# Patient Record
Sex: Male | Born: 1960 | Race: White | Hispanic: No | Marital: Married | Smoking: Former smoker
Health system: Southern US, Community
[De-identification: ages and names within clinical notes are randomized; demographics above are authoritative.]

## PROBLEM LIST (undated history)

## (undated) DIAGNOSIS — Z8781 Personal history of (healed) traumatic fracture: Secondary | ICD-10-CM

## (undated) DIAGNOSIS — K7460 Unspecified cirrhosis of liver: Secondary | ICD-10-CM

## (undated) DIAGNOSIS — E119 Type 2 diabetes mellitus without complications: Secondary | ICD-10-CM

## (undated) DIAGNOSIS — G4730 Sleep apnea, unspecified: Secondary | ICD-10-CM

## (undated) HISTORY — DX: Personal history of (healed) traumatic fracture: Z87.81

## (undated) HISTORY — PX: HERNIA REPAIR: SHX51

## (undated) HISTORY — PX: OTHER SURGICAL HISTORY: SHX169

## (undated) HISTORY — DX: Type 2 diabetes mellitus without complications: E11.9

---

## 2015-01-27 ENCOUNTER — Encounter: Payer: Self-pay | Admitting: Surgery

## 2015-01-27 ENCOUNTER — Other Ambulatory Visit: Payer: Self-pay | Admitting: Surgery

## 2015-01-27 NOTE — Progress Notes (Signed)
Patient ID: Connor Vaughn, male   DOB: Feb 19, 1961, 54 y.o.   MRN: [id number]  Connor Vaughn 01/27/2015 9:00 AM Location: [facility] Patient #: [id number] DOB: 06-Nov-1960 Married / Language: [place] / Race: White Male  History of Present Illness [name]; 01/27/2015 9:36 AM) Patient words: hernia.  The patient is a 54 year old male who presents with an inguinal hernia. Patient sent for surgical consultation from [facility] care in [place], [place]. Dr. Nedra Hai. Concern for inguinal hernia. (Caseworker April Lassiter [phone] extension [phone])  Pleasant active male. Does a fair amount of Curator work. Had an episode 2 weeks ago after doing some heavy lifting to attach the back of a truck and especially after hauling a large tire felt some sharp RIGHT groin pains and noticed a bulge. Felt rather nauseated. Thios bulge gotten larger down to the scrotum within a week. Was sent to urgent care center. Concern for inguinal hernia. Surgical consultation recommended. Patient notes if he lies down he anal gradually fall in. Often feels nauseated. Some bloating and belching with that as well. A little more constipated. However has been able to eat. Never had any surgeries before that he is aware of. Normally has a bowel movement about every day or so. He has not smoked in decades. Normally can walk half hour without difficulty and does moderate activity at work. No history of heart or lung disease.   Past Surgical History Gilmer Mor, CMA; 01/27/2015 9:01 AM) No pertinent past surgical history  Allergies (Sonya Bynum, CMA; 01/27/2015 9:02 AM) No Known Drug Allergies09/10/2014  Medication History (Sonya Bynum, CMA; 01/27/2015 9:02 AM) No Current Medications Medications Reconciled  Review of Systems (Sonya Bynum CMA; 01/27/2015 9:01 AM) Respiratory Not Present- Bloody sputum, Chronic Cough, Difficulty Breathing, Snoring and Wheezing.   Vitals (Sonya  Bynum CMA; 01/27/2015 9:01 AM) 01/27/2015 9:01 AM Weight: 175 lb Height: 66in Body Surface Area: 1.92 m Body Mass Index: 28.25 kg/m Temp.: 38F(Temporal)  Pulse: 79 (Regular)  BP: 132/74 (Sitting, Left Arm, Standard)    Physical Exam [name]; 01/27/2015 9:30 AM) General Mental Status-Alert. General Appearance-Not in acute distress, Not Sickly. Orientation-Oriented X3. Hydration-Well hydrated. Voice-Normal.  Integumentary Global Assessment Upon inspection and palpation of skin surfaces of the - Axillae: non-tender, no inflammation or ulceration, no drainage. and Distribution of scalp and body hair is normal. General Characteristics Temperature - normal warmth is noted.  Head and Neck Head-normocephalic, atraumatic with no lesions or palpable masses. Face Global Assessment - atraumatic, no absence of expression. Neck Global Assessment - no abnormal movements, no bruit auscultated on the right, no bruit auscultated on the left, no decreased range of motion, non-tender. Trachea-midline. Thyroid Gland Characteristics - non-tender.  Eye Eyeball - Left-Extraocular movements intact, No Nystagmus. Eyeball - Right-Extraocular movements intact, No Nystagmus. Cornea - Left-No Hazy. Cornea - Right-No Hazy. Sclera/Conjunctiva - Left-No scleral icterus, No Discharge. Sclera/Conjunctiva - Right-No scleral icterus, No Discharge. Pupil - Left-Direct reaction to light normal. Pupil - Right-Direct reaction to light normal.  ENMT Ears Pinna - Left - no drainage observed, no generalized tenderness observed. Right - no drainage observed, no generalized tenderness observed. Nose and Sinuses External Inspection of the Nose - no destructive lesion observed. Inspection of the nares - Left - quiet respiration. Right - quiet respiration. Mouth and Throat Lips - Upper Lip - no fissures observed, no pallor noted. Lower Lip - no fissures observed,  no pallor noted. Nasopharynx - no discharge present. Oral Cavity/Oropharynx -  Tongue - no dryness observed. Oral Mucosa - no cyanosis observed. Hypopharynx - no evidence of airway distress observed.  Chest and Lung Exam Inspection Movements - Normal and Symmetrical. Accessory muscles - No use of accessory muscles in breathing. Palpation Palpation of the chest reveals - Non-tender. Auscultation Breath sounds - Normal and Clear.  Cardiovascular Auscultation Rhythm - Regular. Murmurs & Other Heart Sounds - Auscultation of the heart reveals - No Murmurs and No Systolic Clicks.  Abdomen Inspection Inspection of the abdomen reveals - No Visible peristalsis and No Abnormal pulsations. Umbilicus - No Bleeding, No Urine drainage. Palpation/Percussion Palpation and Percussion of the abdomen reveal - Soft, Non Tender, No Rebound tenderness, No Rigidity (guarding) and No Cutaneous hyperesthesia. Note: Mild diastases recti. Mild asymmetry at bellybutton but no definite hernia.   Male Genitourinary Sexual Maturity Tanner 5 - Adult hair pattern and Adult penile size and shape. Note: Circumcised male. Large RIGHT inguinal hernia going down the scrotum. Reducible. Rather sensitive. Mild impulse in LEFT groin suspicious for small LEFT inguinal hernia. RIGHT testicle and epididymides rather sensitive but no discrete mass. LEFT testicle optically tender.   Peripheral Vascular Upper Extremity Inspection - Left - No Cyanotic nailbeds, Not Ischemic. Right - No Cyanotic nailbeds, Not Ischemic.  Neurologic Neurologic evaluation reveals -normal attention span and ability to concentrate, able to name objects and repeat phrases. Appropriate fund of knowledge , normal sensation and normal coordination. Mental Status Affect - not angry, not paranoid. Cranial Nerves-Normal Bilaterally. Gait-Normal.  Neuropsychiatric Mental status exam performed with findings of-able to articulate well with normal  speech/language, rate, volume and coherence, thought content normal with ability to perform basic computations and apply abstract reasoning and no evidence of hallucinations, delusions, obsessions or homicidal/suicidal ideation.  Musculoskeletal Global Assessment Spine, Ribs and Pelvis - no instability, subluxation or laxity. Right Upper Extremity - no instability, subluxation or laxity.  Lymphatic Head & Neck  General Head & Neck Lymphatics: Bilateral - Description - No Localized lymphadenopathy. Axillary  General Axillary Region: Bilateral - Description - No Localized lymphadenopathy. Femoral & Inguinal  Generalized Femoral & Inguinal Lymphatics: Left - Description - No Localized lymphadenopathy. Right - Description - No Localized lymphadenopathy.    Assessment & Plan [name]; 01/27/2015 9:34 AM) BILATERAL INGUINAL HERNIA WITHOUT OBSTRUCTION OR GANGRENE, RECURRENCE NOT SPECIFIED (K40.20) Impression: Large and rather sensitive but reducible RIGHT inguinal hernia. Small and less sensitive LEFT inguinal hernia.  I think he would benefit from surgical repair. Reasonable laparoscopic approach as the hernia defect is not particularly large once reduced. He prefers a laparoscopic approach impossible.  Given the sensitivity in size, think we should try and expedite this. He agrees. He wants get back to unrestricted work when it is safe. Current Plans  You are being scheduled for surgery - Our schedulers will call you.  You should hear from our office's scheduling department within 5 working days about the location, date, and time of surgery. We try to make accommodations for patient's preferences in scheduling surgery, but sometimes the OR schedule or the surgeon's schedule prevents [place] from making those accommodations.  If you have not heard from our office [phone]) in 5 working days, call the office and ask for your surgeon's nurse.  If you have other questions about your  diagnosis, plan, or surgery, call the office and ask for your surgeon's nurse. Written instructions provided Pt Education - Pamphlet Given - Laparoscopic Hernia Repair: discussed with patient and provided information.   The anatomy &  physiology of the abdominal wall and pelvic floor was discussed. The pathophysiology of hernias in the inguinal and pelvic region was discussed. Natural history risks such as progressive enlargement, pain, incarceration, and strangulation was discussed. Contributors to complications such as smoking, obesity, diabetes, prior surgery, etc were discussed.  I feel the risks of no intervention will lead to serious problems that outweigh the operative risks; therefore, I recommended surgery to reduce and repair the hernia. I explained laparoscopic techniques with possible need for an open approach. I noted usual use of mesh to patch and/or buttress hernia repair  Risks such as bleeding, infection, abscess, need for further treatment, heart attack, death, and other risks were discussed. I noted a good likelihood this will help address the problem. Goals of post-operative recovery were discussed as well. Possibility that this will not correct all symptoms was explained. I stressed the importance of low-impact activity, aggressive pain control, avoiding constipation, & not pushing through pain to minimize risk of post-operative chronic pain or injury. Possibility of reherniation was discussed. We will work to minimize complications.  An educational handout further explaining the pathology & treatment options was given as well. Questions were answered. The patient expresses understanding & wishes to proceed with surgery. Pt Education - [facility] Pain Control (Elbert Polyakov) Pt Education - [facility] Hernia Post-Op HCI (Cherrelle Plante): discussed with patient and provided information. Started Naproxen 500MG , 1 (one) Tablet two times daily, as needed, #30, 01/27/2015, Ref. x2.   Signed by Ardeth Sportsman, MD  (01/27/2015 9:37 AM)  Ardeth Sportsman, M.D., F.A.C.S. Gastrointestinal and Minimally Invasive Surgery [facility] [address] [place], [place] [address] [phone] Main / Paging

## 2015-09-11 ENCOUNTER — Other Ambulatory Visit: Payer: Self-pay | Admitting: Surgery

## 2015-09-11 NOTE — H&P (Signed)
Connor Vaughn 01/27/2015 9:00 AM Location: [facility] Patient #: [id number] DOB: September 14, 1960 Married / Language: English / Race: White Male   History of Present Illness  Patient words: hernia.  The patient is a 55 year old male who presents with an inguinal hernia. Patient sent for surgical consultation from [facility] care in [place], [place]. Dr. Nedra Hai. Concern for inguinal hernia. 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 Felt rather nauseated. This bulge has gotten larger down to the scrotum within a week. Was sent to urgent care center. Concern for inguinal hernia. Surgical consultation recommended. Patient notes if he lies down he anal gradually fall in. Often feels nauseated. Some bloating and belching with that as well. A little more constipated. However has been able to eat. Never had any surgeries before that he is aware of. Normally has a bowel movement about every day or so. He has not smoked in decades. Normally can walk half hour without difficulty and does moderate activity at work. No history of heart or lung disease.   Past Surgical History Gilmer Mor, CMA; 01/27/2015 9:01 AM) No pertinent past surgical history  Allergies (Sonya Bynum, CMA; 01/27/2015 9:02 AM) No Known Drug Allergies09/10/2014  Medication History (Sonya Bynum, CMA; 01/27/2015 9:02 AM) No Current Medications Medications Reconciled    Review of Systems (Sonya Bynum CMA; 01/27/2015 9:01 AM) Respiratory Not Present- Bloody sputum, Chronic Cough, Difficulty Breathing, Snoring and Wheezing.  Vitals (Sonya Bynum CMA; 01/27/2015 9:01 AM) 01/27/2015 9:01 AM Weight: 175 lb Height: 66in Body Surface Area: 1.92 m  Body Mass Index: 28.25 kg/m  Temp.: 45F(Temporal)  Pulse: 79 (Regular)  BP: 132/74 (Sitting, Left Arm, Standard)     Physical Exam [name]; 01/27/2015 9:30 AM) General Mental Status-Alert. General Appearance-Not in acute distress, Not Sickly. Orientation-Oriented X3. Hydration-Well hydrated. Voice-Normal.  Integumentary Global Assessment Upon inspection and palpation of skin surfaces of the - Axillae: non-tender, no inflammation or ulceration, no drainage. and Distribution of scalp and body hair is normal. General Characteristics Temperature - normal warmth is noted.  Head and Neck Head-normocephalic, atraumatic with no lesions or palpable masses. Face Global Assessment - atraumatic, no absence of expression. Neck Global Assessment - no abnormal movements, no bruit auscultated on the right, no bruit auscultated on the left, no decreased range of motion, non-tender. Trachea-midline. Thyroid Gland Characteristics - non-tender.  Eye Eyeball - Left-Extraocular movements intact, No Nystagmus. Eyeball - Right-Extraocular movements intact, No Nystagmus. Cornea - Left-No Hazy. Cornea - Right-No Hazy. Sclera/Conjunctiva - Left-No scleral icterus, No Discharge. Sclera/Conjunctiva - Right-No scleral icterus, No Discharge. Pupil - Left-Direct reaction to light normal. Pupil - Right-Direct reaction to light normal.  ENMT Ears Pinna - Left - no drainage observed, no generalized tenderness observed. Right - no drainage observed, no generalized tenderness observed. Nose and Sinuses External Inspection of the Nose - no destructive lesion observed. Inspection of the nares - Left - quiet respiration. Right - quiet respiration. Mouth and Throat Lips - Upper Lip - no fissures observed, no pallor noted. Lower Lip - no fissures observed, no pallor noted. Nasopharynx - no discharge present. Oral Cavity/Oropharynx - Tongue - no dryness observed.  Oral Mucosa - no cyanosis observed. Hypopharynx - no evidence of  airway distress observed.  Chest and Lung Exam Inspection Movements - Normal and Symmetrical. Accessory muscles - No use of accessory muscles in breathing. Palpation Palpation of the chest reveals - Non-tender. Auscultation Breath sounds - Normal and Clear.  Cardiovascular Auscultation Rhythm - Regular. Murmurs & Other Heart Sounds - Auscultation of the heart reveals - No Murmurs and No Systolic Clicks.  Abdomen Inspection Inspection of the abdomen reveals - No Visible peristalsis and No Abnormal pulsations. Umbilicus - No Bleeding, No Urine drainage. Palpation/Percussion Palpation and Percussion of the abdomen reveal - Soft, Non Tender, No Rebound tenderness, No Rigidity (guarding) and No Cutaneous hyperesthesia. Note: Mild diastases recti. Mild asymmetry at bellybutton but no definite hernia.   Male Genitourinary Sexual Maturity Tanner 5 - Adult hair pattern and Adult penile size and shape. Note: Circumcised male. Large RIGHT inguinal hernia going down the scrotum. Reducible. Rather sensitive. Mild impulse in LEFT groin suspicious for small LEFT inguinal hernia. RIGHT testicle and epididymides rather sensitive but no discrete mass. LEFT testicle optically tender.   Peripheral Vascular Upper Extremity Inspection - Left - No Cyanotic nailbeds, Not Ischemic. Right - No Cyanotic nailbeds, Not Ischemic.  Neurologic Neurologic evaluation reveals -normal attention span and ability to concentrate, able to name objects and repeat phrases. Appropriate fund of knowledge , normal sensation and normal coordination. Mental Status Affect - not angry, not paranoid. Cranial Nerves-Normal Bilaterally. Gait-Normal.  Neuropsychiatric Mental status exam performed with findings of-able to articulate well with normal speech/language, rate, volume and coherence, thought content normal with ability to perform basic  computations and apply abstract reasoning and no evidence of hallucinations, delusions, obsessions or homicidal/suicidal ideation.  Musculoskeletal Global Assessment Spine, Ribs and Pelvis - no instability, subluxation or laxity. Right Upper Extremity - no instability, subluxation or laxity.  Lymphatic Head & Neck  General Head & Neck Lymphatics: Bilateral - Description - No Localized lymphadenopathy. Axillary  General Axillary Region: Bilateral - Description - No Localized lymphadenopathy. Femoral & Inguinal  Generalized Femoral & Inguinal Lymphatics: Left - Description - No Localized lymphadenopathy. Right - Description - No Localized lymphadenopathy.    Assessment & Plan BILATERAL INGUINAL HERNIA WITHOUT OBSTRUCTION OR GANGRENE, RECURRENCE NOT SPECIFIED (K40.20)  Impression: Large and rather sensitive but reducible RIGHT inguinal hernia. Small and less sensitive LEFT inguinal hernia.  I think he would benefit from surgical repair. Reasonable laparoscopic approach as the hernia defect is not particularly large once reduced. He prefers a laparoscopic approach impossible.  Given the sensitivity in size, think we should try and expedite this. He agrees. He wants get back to unrestricted work when it is safe.   Current Plans You are being scheduled for surgery - Our schedulers will call you.  You should hear from our office's scheduling department within 5 working days about the location, date, and time of surgery. We try to make accommodations for patient's preferences in scheduling surgery, but sometimes the OR schedule or the surgeon's schedule prevents us from making those accommodations.  If you have not heard from our office [phone]) in 5 working days, call the office and ask for your surgeon's nurse.  If you have other questions about your diagnosis, plan, or surgery, call the office and ask for your surgeon's nurse.  Written instructions provided  Pt Education -  Pamphlet Given - Laparoscopic Hernia Repair: discussed with patient and provided information.   The anatomy & physiology of the abdominal wall and pelvic floor was discussed. The pathophysiology of hernias in the inguinal and pelvic  region was discussed. Natural history risks such as progressive enlargement, pain, incarceration, and strangulation was discussed. Contributors to complications such as smoking, obesity, diabetes, prior surgery, etc were discussed.  I feel the risks of no intervention will lead to serious problems that outweigh the operative risks; therefore, I recommended surgery to reduce and repair the hernia. I explained laparoscopic techniques with possible need for an open approach. I noted usual use of mesh to patch and/or buttress hernia repair  Risks such as bleeding, infection, abscess, need for further treatment, heart attack, death, and other risks were discussed. I noted a good likelihood this will help address the problem. Goals of post-operative recovery were discussed as well. Possibility that this will not correct all symptoms was explained. I stressed the importance of low-impact activity, aggressive pain control, avoiding constipation, & not pushing through pain to minimize risk of post-operative chronic pain or injury. Possibility of reherniation was discussed. We will work to minimize complications.  An educational handout further explaining the pathology & treatment options was given as well. Questions were answered. The patient expresses understanding & wishes to proceed with surgery.  Pt Education - [facility] Pain Control (Lindsee Labarre) Pt Education - [facility] Hernia Post-Op HCI (Ariani Seier): discussed with patient and provided information. Started Naproxen , 1 (one) Tablet two times daily, as needed, #30, 01/27/2015, Ref. Beverlyn Roux, M.D., F.A.C.S. Gastrointestinal and Minimally Invasive Surgery [facility] [address], [address] [place],  [place] [address] [phone] Main / Paging

## 2015-09-14 NOTE — Patient Instructions (Signed)
Benn MoulderScott Groner  09/14/2015   Your procedure is scheduled on: 09/17/2015    [facility]  [place]  elevators to 3rd floor to  Short Stay Center at    0800 AM.  Call this number if you have problems the morning of surgery [phone]   Remember: ONLY 1 PERSON MAY GO WITH YOU TO SHORT STAY TO GET  READY MORNING OF YOUR SURGERY.  Do not eat food or drink liquids :After Midnight.     Take these medicines the morning of surgery with A SIP OF WATER: none                                 You may not have any metal on your body including hair pins and              piercings  Do not wear jewelry, , lotions, powders or perfumes, deodorant                        Men may shave face and neck.   Do not bring valuables to the hospital. [facility] IS NOT             RESPONSIBLE   FOR VALUABLES.  Contacts, dentures or bridgework may not be worn into surgery.       Patients discharged the day of surgery will not be allowed to drive home.  Name and phone number of your driver:  Special Instructions: coughing and deep breathing exercises, leg exercises               Please read over the following fact sheets you were given: _____________________________________________________________________             [facility] - Preparing for Surgery Before surgery, you can play an important role.  Because skin is not sterile, your skin needs to be as free of germs as possible.  You can reduce the number of germs on your skin by washing with CHG (chlorahexidine gluconate) soap before surgery.  CHG is an antiseptic cleaner which kills germs and bonds with the skin to continue killing germs even after washing. Please DO NOT use if you have an allergy to CHG or antibacterial soaps.  If your skin becomes reddened/irritated stop using the CHG and inform your nurse when you arrive at Short Stay. Do not shave (including legs and underarms) for at least 48 hours  prior to the first CHG shower.  You may shave your face/neck. Please follow these instructions carefully:  1.  Shower with CHG Soap the night before surgery and the  morning of Surgery.  2.  If you choose to wash your hair, wash your hair first as usual with your  normal  shampoo.  3.  After you shampoo, rinse your hair and body thoroughly to remove the  shampoo.                           4.  Use CHG as you would any other liquid soap.  You can apply chg directly  to the skin and wash                       Gently with a scrungie or clean washcloth.  5.  Apply the CHG Soap to your body ONLY FROM THE NECK DOWN.   Do not use on face/ open                           Wound or open sores. Avoid contact with eyes, ears mouth and genitals (private parts).                       Wash face,  Genitals (private parts) with your normal soap.             6.  Wash thoroughly, paying special attention to the area where your surgery  will be performed.  7.  Thoroughly rinse your body with warm water from the neck down.  8.  DO NOT shower/wash with your normal soap after using and rinsing off  the CHG Soap.                9.  Pat yourself dry with a clean towel.            10.  Wear clean pajamas.            11.  Place clean sheets on your bed the night of your first shower and do not  sleep with pets. Day of Surgery : Do not apply any lotions/deodorants the morning of surgery.  Please wear clean clothes to the hospital/surgery center.  FAILURE TO FOLLOW THESE INSTRUCTIONS MAY RESULT IN THE CANCELLATION OF YOUR SURGERY PATIENT SIGNATURE_________________________________  NURSE SIGNATURE__________________________________  ________________________________________________________________________

## 2015-09-15 ENCOUNTER — Encounter (HOSPITAL_COMMUNITY)
Admission: RE | Admit: 2015-09-15 | Discharge: 2015-09-15 | Disposition: A | Discharge status: Home / Self Care | Payer: BLUE CROSS/BLUE SHIELD | Source: Ambulatory Visit | Attending: Surgery | Admitting: Surgery

## 2015-09-15 ENCOUNTER — Encounter (HOSPITAL_COMMUNITY): Payer: Self-pay

## 2015-09-15 DIAGNOSIS — Z01818 Encounter for other preprocedural examination: Secondary | ICD-10-CM | POA: Insufficient documentation

## 2015-09-15 DIAGNOSIS — I10 Essential (primary) hypertension: Secondary | ICD-10-CM | POA: Diagnosis not present

## 2015-09-15 LAB — COMPREHENSIVE METABOLIC PANEL
ALT: 64 U/L — ABNORMAL HIGH (ref 17–63)
AST: 124 U/L — ABNORMAL HIGH (ref 15–41)
Albumin: 3.2 g/dL — ABNORMAL LOW (ref 3.5–5.0)
Alkaline Phosphatase: 150 U/L — ABNORMAL HIGH (ref 38–126)
Anion gap: 8 (ref 5–15)
BUN: 9 mg/dL (ref 6–20)
CO2: 26 mmol/L (ref 22–32)
Calcium: 8.8 mg/dL — ABNORMAL LOW (ref 8.9–10.3)
Chloride: 105 mmol/L (ref 101–111)
Creatinine, Ser: 0.83 mg/dL (ref 0.61–1.24)
GFR calc Af Amer: >60 mL/min (ref >60)
GFR calc non Af Amer: >60 mL/min (ref >60)
Glucose, Bld: 152 mg/dL — ABNORMAL HIGH (ref 65–99)
Potassium: 4.2 mmol/L (ref 3.5–5.1)
Sodium: 139 mmol/L (ref 135–145)
Total Bilirubin: 2.8 mg/dL — ABNORMAL HIGH (ref 0.3–1.2)
Total Protein: 6.6 g/dL (ref 6.5–8.1)

## 2015-09-15 LAB — CBC
HCT: 42 % (ref 39.0–52.0)
Hemoglobin: 14.8 g/dL (ref 13.0–17.0)
MCH: 32.2 pg (ref 26.0–34.0)
MCHC: 35.2 g/dL (ref 30.0–36.0)
MCV: 91.5 fL (ref 78.0–100.0)
Platelets: 71 10*3/uL — ABNORMAL LOW (ref 150–400)
RBC: 4.59 MIL/uL (ref 4.22–5.81)
RDW: 14.4 % (ref 11.5–15.5)
WBC: 3.1 10*3/uL — ABNORMAL LOW (ref 4.0–10.5)

## 2015-09-15 NOTE — Progress Notes (Signed)
Spoke with Nettie ElmSylvia ( Triage) and DR Michaell CowingGross is in office and she will send him a note to please look at labs done 09/15/2015 which I have routed to him via EPIC. ,

## 2015-09-15 NOTE — Progress Notes (Signed)
Patient scored a " 5" on the [employer] Bang Assessment Tool for Obstructive Sleep Apnea during a presurgical visit.  This score is considered a high risk for Obstructive Sleep Apnea using this tool.  FYI.

## 2015-09-15 NOTE — Progress Notes (Signed)
CBC and CMP done 09/15/2015 faxed via EPIC to Dr Michaell CowingGross.

## 2015-09-17 ENCOUNTER — Encounter (HOSPITAL_COMMUNITY): Admission: RE | Payer: Self-pay | Source: Ambulatory Visit

## 2015-09-17 ENCOUNTER — Ambulatory Visit (HOSPITAL_COMMUNITY): Admission: RE | Admit: 2015-09-17 | Payer: BLUE CROSS/BLUE SHIELD | Source: Ambulatory Visit | Admitting: Surgery

## 2015-09-17 SURGERY — REPAIR, HERNIA, INGUINAL, BILATERAL, LAPAROSCOPIC
Anesthesia: General | Laterality: Bilateral

## 2015-12-24 ENCOUNTER — Emergency Department (HOSPITAL_COMMUNITY): Payer: BLUE CROSS/BLUE SHIELD

## 2015-12-24 ENCOUNTER — Inpatient Hospital Stay (HOSPITAL_COMMUNITY)
Admission: EM | Admit: 2015-12-24 | Discharge: 2015-12-26 | DRG: 186 | Disposition: A | Discharge status: Home / Self Care | Payer: BLUE CROSS/BLUE SHIELD | Attending: Internal Medicine | Admitting: Internal Medicine

## 2015-12-24 ENCOUNTER — Inpatient Hospital Stay (HOSPITAL_COMMUNITY): Payer: BLUE CROSS/BLUE SHIELD

## 2015-12-24 ENCOUNTER — Encounter (HOSPITAL_COMMUNITY): Payer: Self-pay | Admitting: Emergency Medicine

## 2015-12-24 DIAGNOSIS — K7030 Alcoholic cirrhosis of liver without ascites: Secondary | ICD-10-CM | POA: Diagnosis not present

## 2015-12-24 DIAGNOSIS — Z9889 Other specified postprocedural states: Secondary | ICD-10-CM

## 2015-12-24 DIAGNOSIS — Z91040 Latex allergy status: Secondary | ICD-10-CM | POA: Diagnosis not present

## 2015-12-24 DIAGNOSIS — W06XXXA Fall from bed, initial encounter: Secondary | ICD-10-CM | POA: Diagnosis present

## 2015-12-24 DIAGNOSIS — F419 Anxiety disorder, unspecified: Secondary | ICD-10-CM | POA: Diagnosis present

## 2015-12-24 DIAGNOSIS — Z833 Family history of diabetes mellitus: Secondary | ICD-10-CM

## 2015-12-24 DIAGNOSIS — J948 Other specified pleural conditions: Secondary | ICD-10-CM | POA: Diagnosis present

## 2015-12-24 DIAGNOSIS — K7460 Unspecified cirrhosis of liver: Secondary | ICD-10-CM

## 2015-12-24 DIAGNOSIS — R55 Syncope and collapse: Secondary | ICD-10-CM | POA: Diagnosis present

## 2015-12-24 DIAGNOSIS — J189 Pneumonia, unspecified organism: Secondary | ICD-10-CM | POA: Diagnosis present

## 2015-12-24 DIAGNOSIS — R0602 Shortness of breath: Secondary | ICD-10-CM | POA: Diagnosis present

## 2015-12-24 DIAGNOSIS — F1010 Alcohol abuse, uncomplicated: Secondary | ICD-10-CM | POA: Diagnosis present

## 2015-12-24 DIAGNOSIS — Z8701 Personal history of pneumonia (recurrent): Secondary | ICD-10-CM

## 2015-12-24 DIAGNOSIS — I864 Gastric varices: Secondary | ICD-10-CM | POA: Diagnosis present

## 2015-12-24 DIAGNOSIS — J90 Pleural effusion, not elsewhere classified: Secondary | ICD-10-CM | POA: Diagnosis present

## 2015-12-24 DIAGNOSIS — K7031 Alcoholic cirrhosis of liver with ascites: Secondary | ICD-10-CM

## 2015-12-24 DIAGNOSIS — Z8249 Family history of ischemic heart disease and other diseases of the circulatory system: Secondary | ICD-10-CM

## 2015-12-24 DIAGNOSIS — Z87891 Personal history of nicotine dependence: Secondary | ICD-10-CM | POA: Diagnosis not present

## 2015-12-24 DIAGNOSIS — R634 Abnormal weight loss: Secondary | ICD-10-CM | POA: Diagnosis present

## 2015-12-24 HISTORY — DX: Alcohol abuse, uncomplicated: F10.10

## 2015-12-24 LAB — BASIC METABOLIC PANEL
Anion gap: 13 (ref 5–15)
BUN: 11 mg/dL (ref 6–20)
CO2: 27 mmol/L (ref 22–32)
Calcium: 8.9 mg/dL (ref 8.9–10.3)
Chloride: 94 mmol/L — ABNORMAL LOW (ref 101–111)
Creatinine, Ser: 0.87 mg/dL (ref 0.61–1.24)
GFR calc Af Amer: >60 mL/min (ref >60)
GFR calc non Af Amer: >60 mL/min (ref >60)
Glucose, Bld: 153 mg/dL — ABNORMAL HIGH (ref 65–99)
Potassium: 3.5 mmol/L (ref 3.5–5.1)
Sodium: 134 mmol/L — ABNORMAL LOW (ref 135–145)

## 2015-12-24 LAB — CBC WITH DIFFERENTIAL/PLATELET
Basophils Absolute: 0.1 10*3/uL (ref 0.0–0.1)
Basophils Relative: 1 %
Eosinophils Absolute: 0.3 10*3/uL (ref 0.0–0.7)
Eosinophils Relative: 3 %
HCT: 44.2 % (ref 39.0–52.0)
Hemoglobin: 15.2 g/dL (ref 13.0–17.0)
Lymphocytes Relative: 17 %
Lymphs Abs: 1.9 10*3/uL (ref 0.7–4.0)
MCH: 33.3 pg (ref 26.0–34.0)
MCHC: 34.4 g/dL (ref 30.0–36.0)
MCV: 96.7 fL (ref 78.0–100.0)
Monocytes Absolute: 1.2 10*3/uL — ABNORMAL HIGH (ref 0.1–1.0)
Monocytes Relative: 11 %
Neutro Abs: 7.6 10*3/uL (ref 1.7–7.7)
Neutrophils Relative %: 68 %
Platelets: 149 10*3/uL — ABNORMAL LOW (ref 150–400)
RBC: 4.57 MIL/uL (ref 4.22–5.81)
RDW: 14.6 % (ref 11.5–15.5)
WBC: 11.1 10*3/uL — ABNORMAL HIGH (ref 4.0–10.5)

## 2015-12-24 LAB — BODY FLUID CELL COUNT WITH DIFFERENTIAL
Eos, Fluid: 0 %
Lymphs, Fluid: 9 %
Monocyte-Macrophage-Serous Fluid: 57 % (ref 50–90)
Neutrophil Count, Fluid: 34 % — ABNORMAL HIGH (ref 0–25)
Total Nucleated Cell Count, Fluid: 246 cu mm (ref 0–1000)

## 2015-12-24 LAB — CBG MONITORING, ED: Glucose-Capillary: 134 mg/dL — ABNORMAL HIGH (ref 65–99)

## 2015-12-24 LAB — APTT: aPTT: 31 seconds (ref 24–36)

## 2015-12-24 LAB — PROTEIN, BODY FLUID: Total protein, fluid: <3 g/dL

## 2015-12-24 LAB — GLUCOSE, SEROUS FLUID: Glucose, Fluid: 145 mg/dL

## 2015-12-24 LAB — I-STAT TROPONIN, ED: Troponin i, poc: 0.01 ng/mL (ref 0.00–0.08)

## 2015-12-24 LAB — LACTATE DEHYDROGENASE, PLEURAL OR PERITONEAL FLUID: LD, Fluid: 39 U/L — ABNORMAL HIGH (ref 3–23)

## 2015-12-24 LAB — PROTIME-INR
INR: 1.15
Prothrombin Time: 14.8 seconds (ref 11.4–15.2)

## 2015-12-24 LAB — LACTATE DEHYDROGENASE: LDH: 200 U/L — ABNORMAL HIGH (ref 98–192)

## 2015-12-24 MED ORDER — VITAMIN B-1 100 MG PO TABS
100.0000 mg | ORAL_TABLET | Freq: Every day | ORAL | Status: DC
  Administered 2015-12-24 – 2015-12-26 (×3): 100 mg via ORAL
  Filled 2015-12-24 (×3): qty 1

## 2015-12-24 MED ORDER — ALBUTEROL SULFATE (2.5 MG/3ML) 0.083% IN NEBU
2.5000 mg | INHALATION_SOLUTION | RESPIRATORY_TRACT | Status: DC | PRN
  Administered 2015-12-25: 2.5 mg via RESPIRATORY_TRACT
  Filled 2015-12-24: qty 3

## 2015-12-24 MED ORDER — ONDANSETRON HCL 4 MG/2ML IJ SOLN: 4.0000 mg | Freq: Four times a day (QID) | INTRAMUSCULAR | Status: DC | PRN

## 2015-12-24 MED ORDER — FOLIC ACID 1 MG PO TABS
1.0000 mg | ORAL_TABLET | Freq: Every day | ORAL | Status: DC
  Administered 2015-12-24: 1 mg via ORAL
  Filled 2015-12-24: qty 1

## 2015-12-24 MED ORDER — SODIUM CHLORIDE 0.9 % IV SOLN
INTRAVENOUS | Status: DC
  Administered 2015-12-24: 125 mL/h via INTRAVENOUS

## 2015-12-24 MED ORDER — SODIUM CHLORIDE 0.9% FLUSH
3.0000 mL | Freq: Two times a day (BID) | INTRAVENOUS | Status: DC
  Administered 2015-12-24 – 2015-12-25 (×3): 3 mL via INTRAVENOUS

## 2015-12-24 MED ORDER — ONDANSETRON HCL 4 MG PO TABS: 4.0000 mg | ORAL_TABLET | Freq: Four times a day (QID) | ORAL | Status: DC | PRN

## 2015-12-24 MED ORDER — ADULT MULTIVITAMIN W/MINERALS CH: 1.0000 | ORAL_TABLET | Freq: Every day | ORAL | Status: DC

## 2015-12-24 MED ORDER — THIAMINE HCL 100 MG/ML IJ SOLN: 100.0000 mg | Freq: Every day | INTRAMUSCULAR | Status: DC

## 2015-12-24 MED ORDER — OXYCODONE HCL 5 MG PO TABS
5.0000 mg | ORAL_TABLET | ORAL | Status: DC | PRN
  Administered 2015-12-24 – 2015-12-25 (×2): 5 mg via ORAL
  Filled 2015-12-24 (×2): qty 1

## 2015-12-24 MED ORDER — SODIUM CHLORIDE 0.9% FLUSH: 3.0000 mL | INTRAVENOUS | Status: DC | PRN

## 2015-12-24 MED ORDER — SODIUM CHLORIDE 0.9% FLUSH
3.0000 mL | Freq: Two times a day (BID) | INTRAVENOUS | Status: DC
  Administered 2015-12-25 – 2015-12-26 (×2): 3 mL via INTRAVENOUS

## 2015-12-24 MED ORDER — IOPAMIDOL (ISOVUE-300) INJECTION 61%
75.0000 mL | Freq: Once | INTRAVENOUS | Status: AC | PRN
  Administered 2015-12-24: 75 mL via INTRAVENOUS

## 2015-12-24 MED ORDER — LORAZEPAM 2 MG/ML IJ SOLN: 1.0000 mg | Freq: Four times a day (QID) | INTRAMUSCULAR | Status: DC | PRN

## 2015-12-24 MED ORDER — FOLIC ACID 1 MG PO TABS
1.0000 mg | ORAL_TABLET | Freq: Every day | ORAL | Status: DC
  Administered 2015-12-25 – 2015-12-26 (×2): 1 mg via ORAL
  Filled 2015-12-24 (×2): qty 1

## 2015-12-24 MED ORDER — ADULT MULTIVITAMIN W/MINERALS CH
1.0000 | ORAL_TABLET | Freq: Every day | ORAL | Status: DC
  Administered 2015-12-24 – 2015-12-26 (×3): 1 via ORAL
  Filled 2015-12-24 (×2): qty 1

## 2015-12-24 MED ORDER — LORAZEPAM 1 MG PO TABS: 1.0000 mg | ORAL_TABLET | Freq: Four times a day (QID) | ORAL | Status: DC | PRN

## 2015-12-24 MED ORDER — SENNOSIDES-DOCUSATE SODIUM 8.6-50 MG PO TABS
1.0000 | ORAL_TABLET | Freq: Every evening | ORAL | Status: DC | PRN
  Administered 2015-12-25: 1 via ORAL
  Filled 2015-12-24: qty 1

## 2015-12-24 MED ORDER — VITAMIN B-1 100 MG PO TABS: 100.0000 mg | ORAL_TABLET | Freq: Every day | ORAL | Status: DC

## 2015-12-24 MED ORDER — SODIUM CHLORIDE 0.9 % IV SOLN: 250.0000 mL | INTRAVENOUS | Status: DC | PRN

## 2015-12-24 NOTE — H&P (Signed)
HISTORY AND PHYSICAL       PATIENT DETAILS Name: Connor Vaughn Age: 55 y.o. Sex: male Date of Birth: [birth year] Admit Date: 12/24/2015 [id number] Currie Paris, MD   Patient coming from: Home   CHIEF COMPLAINT:  Shortness of breath ongoing for the past 2-3 weeks-but worsening for the past 1 week 3 syncopal episodes in the past 1 week  HPI: Connor Vaughn is a 55 y.o. male with medical history significant of alcohol abuse, presented to the hospital for evaluation of the above-noted complaints. Per patient, over the past 3-4 weeks he has noted shortness of breath that has gradually developed. This was initially mostly exertional, however now it is even at rest and with very minimal exertion. Shortness of that has markedly worsened over the past 1 week. This is been associated with a cough that is mostly dry. Patient claims that on 3 occasions over the past 1 week, he has had paroxysmal as of cough at the end of which he has had a syncopal episode. His last syncopal episode was yesterday afternoon. Because of ongoing worsening symptoms, he presented to the ED today and was found to have a large right-sided pleural effusion. I was subsequently asked to admit this patient for further evaluation and treatment.  Patient does acknowledge having "pneumonia" approximately month or so ago for which she took antibiotics. Apparently he was told that he had some "fluid" in his lung during that time.  He however also acknowledges approximately 80 pound weight loss in the past one year-he attributes this to just not eating well.  There is no history of fever, chest pain or abdominal pain. No history of nausea, vomiting or diarrhea.  ED Course:  In the emergency room, patient was found to be tachycardic, a chest x-ray showed complete opacification of the right hemithorax. CT scan of the chest showed a massive right-sided pleural effusion with deviation of mediastinal contents to the  left. It also showed possible cirrhosis and moderate volume ascites. This M.D. spoke with pulmonology over the phone, we recommended thoracocentesis by interventional radiology.   Lives at: Home Mobility:Independent Chronic Indwelling Foley:no   REVIEW OF SYSTEMS:  Constitutional:   No  weight loss, night sweats,  Fevers, chills, fatigue.  HEENT:    No headaches, Dysphagia,Tooth/dental problems,Sore throat  Cardio-vascular: No chest pain,Orthopnea, PND,anasarca, palpitations  GI:  No heartburn, indigestion, abdominal pain, nausea, vomiting, diarrhea, melena or hematochezia  Resp: No  hemoptysis,plueritic chest pain.   Skin:  No rash or lesions.  GU:  No dysuria, change in color of urine, no urgency or frequency.  No flank pain.  Musculoskeletal: No joint pain or swelling.  No decreased range of motion.  No back pain.  Endocrine: No heat intolerance, no cold intolerance, no polyuria, no polydipsia  Psych: No change in mood or affect. No depression or anxiety.  No memory loss.   ALLERGIES:   Allergies  Allergen Reactions  . Latex Rash    Only on hands.    PAST MEDICAL HISTORY: History reviewed. No pertinent past medical history.  PAST SURGICAL HISTORY: History reviewed. No pertinent surgical history.  MEDICATIONS AT HOME: Prior to Admission medications   Medication Sig Start Date End Date Taking? Authorizing Provider  Dextromethorphan-Guaifenesin (MUCUS RELIEF DM COUGH PO) Take 1 tablet by mouth every 4 (four) hours as needed (for cough).   Yes Historical Provider, MD    FAMILY HISTORY: Mother-CAD Mother- diabetes  SOCIAL HISTORY:  reports  that he has quit smoking. He has never used smokeless tobacco. He reports that he drinks alcohol. He reports that he does not use drugs.  PHYSICAL EXAM: Blood pressure 137/83, pulse (!) 128, temperature 97.9 F (36.6 C), temperature source Oral, resp. rate 18, SpO2 96 %.  General appearance :Awake, alert, not in  any distress. Speech Clear. Not toxic Looking HEENT: Atraumatic and Normocephalic, pupils equally reactive to light and accomodation Neck: supple,  No cervical lymphadenopathy.  Chest: No air entry on the right side at all-moving air well on the left side, some scattered rhonchi but otherwise clear on the left side. CVS: S1 S2 regular, no murmurs.  Abdomen: Bowel sounds present, Non tender and not distended with no gaurding, rigidity or rebound. Palpable liver Extremities: B/L Lower Ext shows + edema, both legs are warm to touch Neurology:  Non focal Psychiatric: Normal judgment and insight. Alert and oriented x 3. Normal mood. Skin:No Rash Wounds:N/A  LABS ON ADMISSION:  I have personally reviewed following labs and imaging studies  CBC:  Recent Labs Lab 12/24/15 1055  WBC 11.1*  NEUTROABS 7.6  HGB 15.2  HCT 44.2  MCV 96.7  PLT 149*    Basic Metabolic Panel:  Recent Labs Lab 12/24/15 1055  NA 134*  K 3.5  CL 94*  CO2 27  GLUCOSE 153*  BUN 11  CREATININE 0.87  CALCIUM 8.9    GFR: CrCl cannot be calculated (Unknown ideal weight.).  Liver Function Tests: No results for input(s): AST, ALT, ALKPHOS, BILITOT, PROT, ALBUMIN in the last 168 hours. No results for input(s): LIPASE, AMYLASE in the last 168 hours. No results for input(s): AMMONIA in the last 168 hours.  Coagulation Profile:  Recent Labs Lab 12/24/15 1055  INR 1.15    Cardiac Enzymes: No results for input(s): CKTOTAL, CKMB, CKMBINDEX, TROPONINI in the last 168 hours.  BNP (last 3 results) No results for input(s): PROBNP in the last 8760 hours.  HbA1C: No results for input(s): HGBA1C in the last 72 hours.  CBG:  Recent Labs Lab 12/24/15 1114  GLUCAP 134*    Lipid Profile: No results for input(s): CHOL, HDL, LDLCALC, TRIG, CHOLHDL, LDLDIRECT in the last 72 hours.  Thyroid Function Tests: No results for input(s): TSH, T4TOTAL, FREET4, T3FREE, THYROIDAB in the last 72 hours.  Anemia  Panel: No results for input(s): VITAMINB12, FOLATE, FERRITIN, TIBC, IRON, RETICCTPCT in the last 72 hours.  Urine analysis: No results found for: COLORURINE, APPEARANCEUR, LABSPEC, PHURINE, GLUCOSEU, HGBUR, BILIRUBINUR, KETONESUR, PROTEINUR, UROBILINOGEN, NITRITE, LEUKOCYTESUR  Sepsis Labs: Lactic Acid, Venous No results found for: LATICACIDVEN   Microbiology: No results found for this or any previous visit (from the past 240 hour(s)).    RADIOLOGIC STUDIES ON ADMISSION: Dg Chest 2 View  Result Date: 12/24/2015 CLINICAL DATA:  Syncopal episode for 1 week. EXAM: CHEST  2 VIEW COMPARISON:  None FINDINGS: Normal heart size. The left lung is clear. There is complete opacification of the right hemi thorax. The visualized osseous structures are notable for mild multi level thoracic spondylosis. IMPRESSION: 1. Complete opacification of the right hemi thorax is noted. Assuming no prior surgical history of pneumonectomy, this may be due to a large right pleural effusion, atelectasis of the entire right lung (perhaps secondary to central obstructing mass) or diffuse pneumonia. Further investigation with contrast enhanced CT of the chest is recommended. Electronically Signed   By: Signa Kell M.D.   On: 12/24/2015 11:23   Dg Shoulder Right  Result Date: 12/24/2015  CLINICAL DATA:  Syncopal episodes over the last week. Shortness of breath and chest pain. Fell this morning with shoulder pain. EXAM: RIGHT SHOULDER - 2+ VIEW COMPARISON:  None. FINDINGS: There is no evidence of fracture or dislocation. There is no evidence of arthropathy or other focal bone abnormality. Soft tissues are unremarkable. IMPRESSION: Negative. Electronically Signed   By: Paulina Fusi M.D.   On: 12/24/2015 11:26   Dg Forearm Right  Result Date: 12/24/2015 CLINICAL DATA:  Larey Seat this morning.  Mid forearm pain. EXAM: RIGHT FOREARM - 2 VIEW COMPARISON:  None. FINDINGS: Negative for radial or ulnar fracture. IMPRESSION: Negative.  Electronically Signed   By: Paulina Fusi M.D.   On: 12/24/2015 11:31   Ct Head Wo Contrast  Result Date: 12/24/2015 CLINICAL DATA:  Fall.  Syncope. EXAM: CT HEAD WITHOUT CONTRAST CT CERVICAL SPINE WITHOUT CONTRAST TECHNIQUE: Multidetector CT imaging of the head and cervical spine was performed following the standard protocol without intravenous contrast. Multiplanar CT image reconstructions of the cervical spine were also generated. COMPARISON:  None. FINDINGS: CT HEAD FINDINGS Ventricle size normal.  Cerebral volume normal. Negative for acute or chronic infarction. Negative for hemorrhage or mass Negative for skull fracture. CT CERVICAL SPINE FINDINGS Normal cervical alignment.  Negative for fracture Mild disc and facet degeneration in the cervical spine. No significant spinal stenosis Large right pleural effusion. Right upper lobe is not aerated due to the effusion. Left upper lobe is clear. IMPRESSION: Negative CT of the head Negative for cervical spine fracture Large right pleural effusion. Electronically Signed   By: Marlan Palau M.D.   On: 12/24/2015 11:53   Ct Chest W Contrast  Result Date: 12/24/2015 CLINICAL DATA:  55 year old male with history of cough, shortness breath and 20 pound weight loss over the past month. EXAM: CT CHEST WITH CONTRAST TECHNIQUE: Multidetector CT imaging of the chest was performed during intravenous contrast administration. CONTRAST:  75mL ISOVUE-300 IOPAMIDOL (ISOVUE-300) INJECTION 61% COMPARISON:  No priors. FINDINGS: Cardiovascular: Heart size is normal. There is no significant pericardial fluid, thickening or pericardial calcification. Calcifications of the aortic valve. Aortic atherosclerosis. Bilateral atria appear compressed. Mediastinum/Nodes: No pathologically enlarged mediastinal or hilar lymph nodes. Esophagus is unremarkable in appearance. 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 No axillary lymphadenopathy. Lungs/Pleura: Massive right-sided pleural effusion  occupying the entire volume of the right hemithorax with associated complete atelectasis of the right lung. This is exerting mass effect slightly deviating the cardiomediastinal structures to into the left hemithorax. Within the collapsed right lung, no discrete pulmonary nodule or mass is confidently identified. Although assessment is slightly limited by patient motion, no obstructing endobronchial lesion is identified. A few patchy areas of ground-glass attenuation are noted in the left upper lobe near the apex. No confluent consolidative airspace disease in the left lung. No left pleural effusion. Upper Abdomen: Visualized portions of the liver demonstrate a shrunken appearance and very nodular contour, compatible with underlying cirrhosis. Moderate volume of perihepatic ascites. Recannulized paraumbilical vein. Numerous portosystemic collateral vessels throughout the upper abdomen, most notable for proximal gastric varices. Spleen is incompletely visualized, but appears at least borderline enlarged. Portal vein is dilated measuring 15 mm in diameter. Musculoskeletal: There are no aggressive appearing lytic or blastic lesions noted in the visualized portions of the skeleton. IMPRESSION: 1. Massive right-sided pleural effusion which appears to potentially be under tension, as demonstrated by deviation of cardiomediastinal structures into the left hemithorax, and apparent atrial compression. It is unusual for such a large pleural effusion to be  present in the absence of underlying malignancy (no definite malignancy confidently identified on today's CT examination). Correlation with thoracentesis is recommended to assess for malignant cells, and to provide symptomatic relief if clinically appropriate. 2. Patchy areas of ground-glass attenuation in the left upper lobe near the apex. These are nonspecific, and presumably infectious/inflammatory in etiology. 3. Cirrhosis with stigmata of portal hypertension, including  distal esophageal and proximal gastric varices, as above. 4. Moderate volume of ascites. 5. Aortic atherosclerosis. 6. There are calcifications of the aortic valve. Echocardiographic correlation for evaluation of potential valvular dysfunction may be warranted if clinically indicated. Electronically Signed   By: Trudie Reed M.D.   On: 12/24/2015 12:10   Ct Cervical Spine Wo Contrast  Result Date: 12/24/2015 CLINICAL DATA:  Fall.  Syncope. EXAM: CT HEAD WITHOUT CONTRAST CT CERVICAL SPINE WITHOUT CONTRAST TECHNIQUE: Multidetector CT imaging of the head and cervical spine was performed following the standard protocol without intravenous contrast. Multiplanar CT image reconstructions of the cervical spine were also generated. COMPARISON:  None. FINDINGS: CT HEAD FINDINGS Ventricle size normal.  Cerebral volume normal. Negative for acute or chronic infarction. Negative for hemorrhage or mass Negative for skull fracture. CT CERVICAL SPINE FINDINGS Normal cervical alignment.  Negative for fracture Mild disc and facet degeneration in the cervical spine. No significant spinal stenosis Large right pleural effusion. Right upper lobe is not aerated due to the effusion. Left upper lobe is clear. IMPRESSION: Negative CT of the head Negative for cervical spine fracture Large right pleural effusion. Electronically Signed   By: Marlan Palau M.D.   On: 12/24/2015 11:53    I have personally reviewed images of chest xray or the CT chest   EKG:  Personally reviewed. Sinus tachycardia  ASSESSMENT AND PLAN: Massive right-sided pleural effusion: Etiology not clear-needs a thoracocentesis to determine whether it is a transudate or exudate. He probably has a history of undiagnosed liver cirrhosis, so could have pleural effusion secondary to this. However he also has a history of extensive-mostly unintentional weight loss over the past one year, and could have a malignant pleural effusion as well. Furthermore, he is had a  recent history of pneumonia for which he took antibiotics. Await pleural fluid analysis. ED MD has consulted interventional radiology for thoracocentesis. We may need to consult pulmonology on 8/4.  Syncope: Occurred in a setting of bouts of cough-suspect vasovagal etiology. Monitor in telemetry, check echocardiogram.  History of alcohol abuse: Unreliable historian-at times claims that his last drink was 9 days back and at times claims it was 16 days back. His spouse at bedside, is pretty confident that patient has not had a drink for the past 9 days at least. He is not tremulous, is awake and alert-but is tachycardic (has other reasons to be tachycardic-anxiety/ large pleural effusion). Will place on as needed Ativan per protocol. Follow.  CT scan evidence of liver cirrhosis: Probably secondary to alcohol use. Will check hepatitis serology to complete workup. If pleural fluid analysis suggests a transudative etiology, will need to be started on appropriate diuretics for liver cirrhosis. CT does show moderate volume ascites-however clinically has some mild dullness on percussion at his abdominal flanks-but abdomen is not tense, shifting dullness or fluid thrill is not present.  Further plan will depend as patient's clinical course evolves and further radiologic and laboratory data become available. Patient will be monitored closely.  Above noted plan was discussed with patient/spouse/daughter face to face at bedside, they were in agreement.   CONSULTS: Interventional  radiology  DVT Prophylaxis: SCD's for now-resume from pharmacological prophylaxis once thoracocentesis has been completed  Code Status: Full Code  Disposition Plan:  Discharge back home possibly in 2-3 days, depending on clinical course  Admission status: Inpatient vs tele  Total time spent  55 minutes.Greater than 50% of this time was spent in counseling, explanation of diagnosis, planning of further management, and  coordination of care.  [facility] Pager [phone]  If 7PM-7AM, please contact night-coverage [email or web address] Password [email or web address] 12/24/2015, 1:32 PM

## 2015-12-24 NOTE — ED Triage Notes (Signed)
Patient states that over week he has been coughing, productive that is clear that is worse when he lays down at night.  Patient states that 2 times he got to coughing so bad/hard got so SOB that he passed out.  Patient states that last night was most recent fall.  He fell out of bed and landed on ground.  Patient has bandaid on nose from fall, c/o bilat thumb pain/numbness from fall.  Patient states that gets SOB with exertion.  Patient went to [facility] last Friday and was giving neb treatment and 4 baby ASA.  They did EKG but didn't do chest xray per patient.  Patient has been taking the OTC fluid/cold meds and drinking more water and thought he was starting to feel better until fall from last night.

## 2015-12-24 NOTE — ED Notes (Signed)
Patient transported to X-ray 

## 2015-12-24 NOTE — ED Triage Notes (Signed)
Pt in CT.

## 2015-12-24 NOTE — Procedures (Signed)
Ultrasound-guided diagnostic and therapeutic right thoracentesis performed yielding 2 liters of yellow fluid. No immediate complications. Follow-up chest x-ray pending. A portion of the fluid was sent to the lab for preordered studies. Due to this being pt's first thoracentesis, only the above amount of fluid was removed today.

## 2015-12-24 NOTE — ED Provider Notes (Signed)
[facility]-EMERGENCY DEPT Provider Note   CSN: [id number] Arrival date & time: 12/24/15  4098  First Provider Contact:  First MD Initiated Contact with Patient 12/24/15 1027        History   Chief Complaint Chief Complaint  Patient presents with  . Cough  . Loss of Consciousness  . Shortness of Breath    HPI Connor Vaughn is a 55 y.o. male.  Per triage note" Laurier Nancy, RN  12/24/2015 10:11     Patient states that over week he has been coughing, productive that is clear that is worse when he lays down at night.  Patient states that 2 times he got to coughing so bad/hard got so SOB that he passed out.  Patient states that last night was most recent fall.  He fell out of bed and landed on ground.  Patient has bandaid on nose from fall, c/o bilat thumb pain/numbness from fall.  Patient states that gets SOB with exertion.  Patient went to [facility] last Friday and was giving neb treatment and 4 baby ASA.  They did EKG but didn't do chest xray per patient.  Patient has been taking the OTC fluid/cold meds and drinking more water and thought he was starting to feel better until fall from last night"  He has been having severe coughing spells.  It seems to be getting worse.  Last night he had a syncopal episode after the coughing.  He feels short of breath.  He has tried OTC meds without relief.  No history of breathing problems.  No smoking history.   Mild headache.  No vomiting.  Some loose stools.   Not much of an appetite.  No fevers.     The history is provided by the patient.  Cough   This is a recurrent problem. The current episode started more than 1 week ago (2 weeks ago). The problem occurs constantly. The problem has been gradually worsening. He has tried nothing for the symptoms.  Loss of Consciousness   Associated symptoms include syncope.  Shortness of Breath  Associated symptoms include shortness of breath.    History reviewed. No pertinent past medical  history.  Patient Active Problem List   Diagnosis Date Noted  . Pleural effusion 12/24/2015    History reviewed. No pertinent surgical history.     Home Medications    Prior to Admission medications   Medication Sig Start Date End Date Taking? Authorizing Provider  ibuprofen (ADVIL,MOTRIN) 200 MG tablet Take 400 mg by mouth every 6 (six) hours as needed (For headache, pain or fever.).    Historical Provider, MD  tetrahydrozoline 0.05 % ophthalmic solution Place 1 drop into both eyes 4 (four) times daily as needed (For eye irritation due to allergies.).    Historical Provider, MD    Family History No family history on file.  Social History Social History  Substance Use Topics  . Smoking status: Former Games developer  . Smokeless tobacco: Never Used  . Alcohol use Yes     Comment: every other day - drinks beer 6 pack      Allergies   Latex   Review of Systems Review of Systems  Respiratory: Positive for cough and shortness of breath.   Cardiovascular: Positive for syncope.  All other systems reviewed and are negative.    Physical Exam Updated Vital Signs BP 137/83   Pulse (!) 128   Temp 97.9 F (36.6 C) (Oral)   Resp 18  SpO2 96%   Physical Exam  Constitutional: He appears well-developed and well-nourished. No distress.  HENT:  Head: Normocephalic.  Right Ear: External ear normal.  Left Ear: External ear normal.  Nasal abrasion, ttp and swelling, forehead abrasion  Eyes: Conjunctivae and EOM are normal. Pupils are equal, round, and reactive to light. Right eye exhibits no discharge. Left eye exhibits no discharge. No scleral icterus.  Neck: Neck supple. No tracheal deviation present.  Cardiovascular: Normal rate, regular rhythm and intact distal pulses.   Pulmonary/Chest: Effort normal. No stridor. No respiratory distress. He has decreased breath sounds in the right upper field, the right middle field and the right lower field. He has no wheezes. He has no  rales.  Abdominal: Soft. Bowel sounds are normal. He exhibits no distension. There is no tenderness. There is no rebound and no guarding.  Musculoskeletal: He exhibits no edema.       Right shoulder: He exhibits tenderness.       Right wrist: Normal.       Left wrist: Normal.       Cervical back: Normal.       Lumbar back: He exhibits tenderness.       Right forearm: He exhibits tenderness.       Right hand: Normal.       Left hand: Normal.  Neurological: He is alert. He has normal strength. No cranial nerve deficit (no facial droop, extraocular movements intact, no slurred speech) or sensory deficit. He exhibits normal muscle tone. He displays no seizure activity. Coordination normal.  Skin: Skin is warm and dry. No rash noted.  Psychiatric: He has a normal mood and affect.  Nursing note and vitals reviewed.    ED Treatments / Results  Labs (all labs ordered are listed, but only abnormal results are displayed) Labs Reviewed  BASIC METABOLIC PANEL - Abnormal; Notable for the following:       Result Value   Sodium 134 (*)    Chloride 94 (*)    Glucose, Bld 153 (*)    All other components within normal limits  CBC WITH DIFFERENTIAL/PLATELET - Abnormal; Notable for the following:    WBC 11.1 (*)    Platelets 149 (*)    Monocytes Absolute 1.2 (*)    All other components within normal limits  CBG MONITORING, ED - Abnormal; Notable for the following:    Glucose-Capillary 134 (*)    All other components within normal limits  PROTIME-INR  APTT  LACTATE DEHYDROGENASE  POCT CBG (FASTING - GLUCOSE)-MANUAL ENTRY  I-STAT TROPOININ, ED    EKG  EKG Interpretation  Date/Time:  Thursday December 24 2015 10:48:34 EDT Ventricular Rate:  126 PR Interval:    QRS Duration: 81 QT Interval:  346 QTC Calculation: 501 R Axis:   -25 Text Interpretation:  Sinus tachycardia Borderline left axis deviation Borderline low voltage, extremity leads , new since last tracing Nonspecific T  abnormalities, lateral leads , new since last tracing Prolonged QT interval , new since last tracing Baseline wander in lead(s) V1 Since last tracing rate faster Confirmed by Lilianne Delair  MD-J, Liller Yohn ([id number]) on 12/24/2015 11:15:15 AM       Radiology Dg Chest 2 View  Result Date: 12/24/2015 CLINICAL DATA:  Syncopal episode for 1 week. EXAM: CHEST  2 VIEW COMPARISON:  None FINDINGS: Normal heart size. The left lung is clear. There is complete opacification of the right hemi thorax. The visualized osseous structures are notable for mild multi level thoracic spondylosis.  IMPRESSION: 1. Complete opacification of the right hemi thorax is noted. Assuming no prior surgical history of pneumonectomy, this may be due to a large right pleural effusion, atelectasis of the entire right lung (perhaps secondary to central obstructing mass) or diffuse pneumonia. Further investigation with contrast enhanced CT of the chest is recommended. Electronically Signed   By: Signa Kell M.D.   On: 12/24/2015 11:23   Dg Shoulder Right  Result Date: 12/24/2015 CLINICAL DATA:  Syncopal episodes over the last week. Shortness of breath and chest pain. Fell this morning with shoulder pain. EXAM: RIGHT SHOULDER - 2+ VIEW COMPARISON:  None. FINDINGS: There is no evidence of fracture or dislocation. There is no evidence of arthropathy or other focal bone abnormality. Soft tissues are unremarkable. IMPRESSION: Negative. Electronically Signed   By: Paulina Fusi M.D.   On: 12/24/2015 11:26   Dg Forearm Right  Result Date: 12/24/2015 CLINICAL DATA:  Larey Seat this morning.  Mid forearm pain. EXAM: RIGHT FOREARM - 2 VIEW COMPARISON:  None. FINDINGS: Negative for radial or ulnar fracture. IMPRESSION: Negative. Electronically Signed   By: Paulina Fusi M.D.   On: 12/24/2015 11:31   Ct Head Wo Contrast  Result Date: 12/24/2015 CLINICAL DATA:  Fall.  Syncope. EXAM: CT HEAD WITHOUT CONTRAST CT CERVICAL SPINE WITHOUT CONTRAST TECHNIQUE: Multidetector CT  imaging of the head and cervical spine was performed following the standard protocol without intravenous contrast. Multiplanar CT image reconstructions of the cervical spine were also generated. COMPARISON:  None. FINDINGS: CT HEAD FINDINGS Ventricle size normal.  Cerebral volume normal. Negative for acute or chronic infarction. Negative for hemorrhage or mass Negative for skull fracture. CT CERVICAL SPINE FINDINGS Normal cervical alignment.  Negative for fracture Mild disc and facet degeneration in the cervical spine. No significant spinal stenosis Large right pleural effusion. Right upper lobe is not aerated due to the effusion. Left upper lobe is clear. IMPRESSION: Negative CT of the head Negative for cervical spine fracture Large right pleural effusion. Electronically Signed   By: Marlan Palau M.D.   On: 12/24/2015 11:53   Ct Chest W Contrast  Result Date: 12/24/2015 CLINICAL DATA:  55 year old male with history of cough, shortness breath and 20 pound weight loss over the past month. EXAM: CT CHEST WITH CONTRAST TECHNIQUE: Multidetector CT imaging of the chest was performed during intravenous contrast administration. CONTRAST:  75mL ISOVUE-300 IOPAMIDOL (ISOVUE-300) INJECTION 61% COMPARISON:  No priors. FINDINGS: Cardiovascular: Heart size is normal. There is no significant pericardial fluid, thickening or pericardial calcification. Calcifications of the aortic valve. Aortic atherosclerosis. Bilateral atria appear compressed. Mediastinum/Nodes: No pathologically enlarged mediastinal or hilar lymph nodes. Esophagus is unremarkable in appearance. 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 No axillary lymphadenopathy. Lungs/Pleura: Massive right-sided pleural effusion occupying the entire volume of the right hemithorax with associated complete atelectasis of the right lung. This is exerting mass effect slightly deviating the cardiomediastinal structures to into the left hemithorax. Within the collapsed right  lung, no discrete pulmonary nodule or mass is confidently identified. Although assessment is slightly limited by patient motion, no obstructing endobronchial lesion is identified. A few patchy areas of ground-glass attenuation are noted in the left upper lobe near the apex. No confluent consolidative airspace disease in the left lung. No left pleural effusion. Upper Abdomen: Visualized portions of the liver demonstrate a shrunken appearance and very nodular contour, compatible with underlying cirrhosis. Moderate volume of perihepatic ascites. Recannulized paraumbilical vein. Numerous portosystemic collateral vessels throughout the upper abdomen, most notable for proximal gastric  varices. Spleen is incompletely visualized, but appears at least borderline enlarged. Portal vein is dilated measuring 15 mm in diameter. Musculoskeletal: There are no aggressive appearing lytic or blastic lesions noted in the visualized portions of the skeleton. IMPRESSION: 1. Massive right-sided pleural effusion which appears to potentially be under tension, as demonstrated by deviation of cardiomediastinal structures into the left hemithorax, and apparent atrial compression. It is unusual for such a large pleural effusion to be present in the absence of underlying malignancy (no definite malignancy confidently identified on today's CT examination). Correlation with thoracentesis is recommended to assess for malignant cells, and to provide symptomatic relief if clinically appropriate. 2. Patchy areas of ground-glass attenuation in the left upper lobe near the apex. These are nonspecific, and presumably infectious/inflammatory in etiology. 3. Cirrhosis with stigmata of portal hypertension, including distal esophageal and proximal gastric varices, as above. 4. Moderate volume of ascites. 5. Aortic atherosclerosis. 6. There are calcifications of the aortic valve. Echocardiographic correlation for evaluation of potential valvular dysfunction  may be warranted if clinically indicated. Electronically Signed   By: Trudie Reed M.D.   On: 12/24/2015 12:10   Ct Cervical Spine Wo Contrast  Result Date: 12/24/2015 CLINICAL DATA:  Fall.  Syncope. EXAM: CT HEAD WITHOUT CONTRAST CT CERVICAL SPINE WITHOUT CONTRAST TECHNIQUE: Multidetector CT imaging of the head and cervical spine was performed following the standard protocol without intravenous contrast. Multiplanar CT image reconstructions of the cervical spine were also generated. COMPARISON:  None. FINDINGS: CT HEAD FINDINGS Ventricle size normal.  Cerebral volume normal. Negative for acute or chronic infarction. Negative for hemorrhage or mass Negative for skull fracture. CT CERVICAL SPINE FINDINGS Normal cervical alignment.  Negative for fracture Mild disc and facet degeneration in the cervical spine. No significant spinal stenosis Large right pleural effusion. Right upper lobe is not aerated due to the effusion. Left upper lobe is clear. IMPRESSION: Negative CT of the head Negative for cervical spine fracture Large right pleural effusion. Electronically Signed   By: Marlan Palau M.D.   On: 12/24/2015 11:53    Procedures .Critical Care Performed by: Linwood Dibbles Authorized by: Linwood Dibbles   Critical care provider statement:    Critical care time (minutes):  35   Critical care was necessary to treat or prevent imminent or life-threatening deterioration of the following conditions:  Respiratory failure   Critical care was time spent personally by me on the following activities:  Blood draw for specimens, discussions with consultants, evaluation of patient's response to treatment, examination of patient, ordering and performing treatments and interventions, ordering and review of laboratory studies, ordering and review of radiographic studies, pulse oximetry and re-evaluation of patient's condition   (including critical care time)  Medications Ordered in ED Medications  0.9 %  sodium  chloride infusion (125 mL/hr Intravenous New Bag/Given 12/24/15 1114)  iopamidol (ISOVUE-300) 61 % injection 75 mL (75 mLs Intravenous Contrast Given 12/24/15 1148)     Initial Impression / Assessment and Plan / ED Course  I have reviewed the triage vital signs and the nursing notes.  Pertinent labs & imaging results that were available during my care of the patient were reviewed by me and considered in my medical decision making (see chart for details).  Clinical Course  Value Comment By Time   Decreased breath sounds noted on initial exam.  ?effusion, PTX?.  No resp distress.  Xray pending Linwood Dibbles, MD 08/03 1106   Reviewed CXR.  Large pleural effusion.  Correlates with his exam.  Ct chest ordered.   Linwood Dibbles, MD 08/03 1203   Labs reviewed. No significant abnormalities Linwood Dibbles, MD 08/03 1204  CT Head Wo Contrast (Reviewed) Linwood Dibbles, MD 08/03 1225   Reviewed CT findings with patient and family.  Pt does admit to heavy alcohol use but none in 16 days Linwood Dibbles, MD 08/03 1243    The patient's evaluation is notable for a very large right-sided pleural effusion that has completely opacified his right hemithorax. CT scan does not demonstrate any mass. There is no evidence of pneumonia. CT scan also demonstrates the patient has cirrhosis. He admits to heavy alcohol use. He has not seen a doctor in a long period of time. Suspect that this hemithorax could be related to his cirrhosis. Patient remains comfortable in the emergency room.  I have ordered an ultrasound-guided thoracentesis.  Spoke with Dr. Jerral Ralph who will be admitting the patient to the hospital for further treatment and evaluation.  Final Clinical Impressions(s) / ED Diagnoses   Final diagnoses:  Pleural effusion  Alcoholic cirrhosis of liver without ascites (HCC)      Linwood Dibbles, MD 12/24/15 1314

## 2015-12-25 ENCOUNTER — Inpatient Hospital Stay (HOSPITAL_COMMUNITY): Payer: BLUE CROSS/BLUE SHIELD

## 2015-12-25 DIAGNOSIS — R55 Syncope and collapse: Secondary | ICD-10-CM

## 2015-12-25 DIAGNOSIS — J948 Other specified pleural conditions: Secondary | ICD-10-CM

## 2015-12-25 DIAGNOSIS — F1010 Alcohol abuse, uncomplicated: Secondary | ICD-10-CM

## 2015-12-25 LAB — CBC
HCT: 40.2 % (ref 39.0–52.0)
Hemoglobin: 13.9 g/dL (ref 13.0–17.0)
MCH: 33.4 pg (ref 26.0–34.0)
MCHC: 34.6 g/dL (ref 30.0–36.0)
MCV: 96.6 fL (ref 78.0–100.0)
Platelets: 101 10*3/uL — ABNORMAL LOW (ref 150–400)
RBC: 4.16 MIL/uL — ABNORMAL LOW (ref 4.22–5.81)
RDW: 14.2 % (ref 11.5–15.5)
WBC: 7 10*3/uL (ref 4.0–10.5)

## 2015-12-25 LAB — COMPREHENSIVE METABOLIC PANEL
ALT: 30 U/L (ref 17–63)
AST: 58 U/L — ABNORMAL HIGH (ref 15–41)
Albumin: 2.6 g/dL — ABNORMAL LOW (ref 3.5–5.0)
Alkaline Phosphatase: 111 U/L (ref 38–126)
Anion gap: 6 (ref 5–15)
BUN: 9 mg/dL (ref 6–20)
CO2: 31 mmol/L (ref 22–32)
Calcium: 8.3 mg/dL — ABNORMAL LOW (ref 8.9–10.3)
Chloride: 99 mmol/L — ABNORMAL LOW (ref 101–111)
Creatinine, Ser: 0.86 mg/dL (ref 0.61–1.24)
GFR calc Af Amer: >60 mL/min (ref >60)
GFR calc non Af Amer: >60 mL/min (ref >60)
Glucose, Bld: 108 mg/dL — ABNORMAL HIGH (ref 65–99)
Potassium: 3.1 mmol/L — ABNORMAL LOW (ref 3.5–5.1)
Sodium: 136 mmol/L (ref 135–145)
Total Bilirubin: 3.4 mg/dL — ABNORMAL HIGH (ref 0.3–1.2)
Total Protein: 6.1 g/dL — ABNORMAL LOW (ref 6.5–8.1)

## 2015-12-25 LAB — GRAM STAIN

## 2015-12-25 LAB — ECHOCARDIOGRAM COMPLETE
Height: 66 in
Weight: 2921.6 oz

## 2015-12-25 LAB — BRAIN NATRIURETIC PEPTIDE: B Natriuretic Peptide: 62.2 pg/mL (ref 0.0–100.0)

## 2015-12-25 MED ORDER — SPIRONOLACTONE 25 MG PO TABS: 25.0000 mg | ORAL_TABLET | Freq: Every day | ORAL | Status: DC

## 2015-12-25 MED ORDER — SPIRONOLACTONE 25 MG PO TABS
100.0000 mg | ORAL_TABLET | Freq: Every day | ORAL | Status: DC
  Administered 2015-12-25 – 2015-12-26 (×2): 100 mg via ORAL
  Filled 2015-12-25 (×2): qty 4

## 2015-12-25 MED ORDER — POTASSIUM CHLORIDE CRYS ER 20 MEQ PO TBCR
40.0000 meq | EXTENDED_RELEASE_TABLET | Freq: Four times a day (QID) | ORAL | Status: AC
  Administered 2015-12-25 (×2): 40 meq via ORAL
  Filled 2015-12-25 (×2): qty 2

## 2015-12-25 MED ORDER — FUROSEMIDE 40 MG PO TABS
40.0000 mg | ORAL_TABLET | Freq: Every day | ORAL | Status: DC
  Administered 2015-12-25 – 2015-12-26 (×2): 40 mg via ORAL
  Filled 2015-12-25 (×2): qty 1

## 2015-12-25 MED ORDER — LORAZEPAM 0.5 MG PO TABS
0.5000 mg | ORAL_TABLET | Freq: Once | ORAL | Status: AC
  Administered 2015-12-25: 0.5 mg via ORAL
  Filled 2015-12-25: qty 1

## 2015-12-25 NOTE — Progress Notes (Signed)
PROGRESS NOTE    Connor Vaughn  UJW:[id number] DOB: [birth year] DOA: 12/24/2015 PCP: Lonia Skinner, MD   Brief Narrative:  [name] is a 55 year old gentleman with a past medical history of alcohol abuse, presented to the emergency room on 12/24/2015 with complaints of shortness of breath and syncope. She workup included a CT scan of chest with IV contrast that revealed massive right-sided pleural effusion appeared to be under tension with deviation of cardiomediastinal structures into the left hemithorax. Interventional radiology was consulted as he underwent ultrasound-guided thoracentesis with removal of 2 L of pleuritic fluid. Fluid analysis consistent with transudate of effusion. 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 I suspect that he had decreased cardiac output from compression on his heart in setting of massive right-sided pleural effusion.   Assessment & Plan:   Active Problems:   Pleural effusion   Pleural effusion, right   Alcohol abuse   Syncope   1.  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  -I spoke with interventional radiology on 12/25/2015 and plan for another ultrasound-guided thoracentesis either today or this coming Monday  2.  Syncope --Suspect this was related to decreased cardiac output from compression from massive right-sided pleural effusion. Radiology reported that this lead to deviation of cardiomediastinal  structures and atrial compression. -Status post ultrasound-guided thoracentesis with removal of 2 L of fluid  3.  Cirrhosis -CT scan showing stigmata of portal hypertension as well as distal esophageal and proximal gastric varices. -Patient was extensively counseled on alcohol abuse, he seems intent on getting help -He was started on diuretic therapy with spironolactone 100 mg by mouth daily and Lasix 40 mg by mouth daily -Dr Elnoria Howard of GI consulted will be set up for outpatient EGD -Will order right upper quadrant ultrasound, for screening of malignancy   DVT prophylaxis: SCDs Code Status: Full code Family Communication: Spoke to his wife Disposition Plan:   Consultants:   Interventional radiology  GI  Procedures:   Ultrasound-guided thoracentesis performed on 12/24/2015   Subjective: He states feeling much better after getting fluid removed  Objective: Vitals:   12/24/15 2257 12/25/15 0236 12/25/15 0514 12/25/15 1413  BP:   128/72 129/82  Pulse: (!) 110 (!) 101 (!) 103 (!) 108  Resp:   16 18  Temp:   97.9 F (36.6 C) 97.7 F (36.5 C)  TempSrc:   Oral Oral  SpO2:   94% 95%  Weight:      Height:        Intake/Output Summary (Last 24 hours) at 12/25/15 1544 Last data filed at 12/25/15 1400  Gross per 24 hour  Intake              240 ml  Output              675 ml  Net             -435 ml   Filed Weights   12/24/15 1445  Weight: 82.8 kg (182 lb 9.6 oz)    Examination:  General exam: Appears  calm and comfortable, Nontoxic appearing, does not appear to be in respiratory distress Respiratory system: Decreased breath sounds to right base Cardiovascular system: S1 & S2 heard, RRR. No JVD, murmurs, rubs, gallops or clicks. No pedal edema. Gastrointestinal system: Abdomen is perhaps mildly distended soft and nontender. No organomegaly or masses felt. Normal bowel sounds heard. Central nervous system: Alert and oriented. No focal neurological deficits. Extremities:  Symmetric 5 x 5 power. Skin: No rashes, lesions or ulcers Psychiatry: Judgement and insight appear normal. Mood & affect appropriate.     Data Reviewed: I have personally reviewed following labs and imaging studies  CBC:  Recent Labs Lab 12/24/15 1055 12/25/15 0521  WBC 11.1* 7.0  NEUTROABS 7.6  --   HGB 15.2 13.9  HCT 44.2 40.2  MCV 96.7 96.6  PLT 149* 101*   Basic Metabolic Panel:  Recent Labs Lab 12/24/15 1055 12/25/15 0521  NA 134* 136  K 3.5 3.1*  CL 94* 99*  CO2 27 31  GLUCOSE 153* 108*  BUN 11 9  CREATININE 0.87 0.86  CALCIUM 8.9 8.3*   GFR: Estimated Creatinine Clearance: 100.3 mL/min (by C-G formula based on SCr of 0.86 mg/dL). Liver Function Tests:  Recent Labs Lab 12/25/15 0521  AST 58*  ALT 30  ALKPHOS 111  BILITOT 3.4*  PROT 6.1*  ALBUMIN 2.6*   No results for input(s): LIPASE, AMYLASE in the last 168 hours. No results for input(s): AMMONIA in the last 168 hours. Coagulation Profile:  Recent Labs Lab 12/24/15 1055  INR 1.15   Cardiac Enzymes: No results for input(s): CKTOTAL, CKMB, CKMBINDEX, TROPONINI in the last 168 hours. BNP (last 3 results) No results for input(s): PROBNP in the last 8760 hours. HbA1C: No results for input(s): HGBA1C in the last 72 hours. CBG:  Recent Labs Lab 12/24/15 1114  GLUCAP 134*   Lipid Profile: No results for input(s): CHOL, HDL, LDLCALC, TRIG, CHOLHDL, LDLDIRECT in the last 72 hours. Thyroid Function Tests: No results for input(s): TSH, T4TOTAL, FREET4, T3FREE, THYROIDAB in the last 72 hours. Anemia Panel: No results for input(s): VITAMINB12, FOLATE, FERRITIN, TIBC, IRON, RETICCTPCT in the last 72 hours. Sepsis Labs: No results for input(s): PROCALCITON, LATICACIDVEN in the last 168 hours.  Recent Results (from the past 240 hour(s))  Culture, body fluid-bottle     Status: None (Preliminary result)   Collection Time: 12/24/15  4:00 PM  Result Value Ref Range Status   Specimen Description  FLUID PLEURAL RIGHT  Final   Special Requests BOTTLES DRAWN AEROBIC AND ANAEROBIC 10CC  Final   Culture   Final    NO GROWTH < 24 HOURS Performed at [facility]    Report Status PENDING  Incomplete  Gram stain     Status: None   Collection Time: 12/24/15  4:00 PM  Result Value Ref Range Status   Specimen Description FLUID PLEURAL RIGHT  Final   Special Requests NONE  Final   Gram Stain   Final    WBC PRESENT,BOTH PMN AND MONONUCLEAR NO ORGANISMS SEEN Performed at [facility]    Report Status 12/25/2015 FINAL  Final         Radiology Studies: Dg Chest 1 View  Result Date: 12/24/2015 CLINICAL DATA:  Status post right-sided thoracentesis. EXAM: CHEST 1 VIEW COMPARISON:  Chest CT 12/24/2015 FINDINGS: There is a persistent large right pleural effusion. Some central aerated right lung is now visualized. The left lung remains clear. IMPRESSION: Persistent large right pleural effusion. Some central  lung aeration is identified now. Electronically Signed   By: Rudie Meyer M.D.   On: 12/24/2015 16:11   Dg Chest 2 View  Result Date: 12/24/2015 CLINICAL DATA:  Syncopal episode for 1 week. EXAM: CHEST  2 VIEW COMPARISON:  None FINDINGS: Normal heart size. The left lung is clear. There is complete opacification of the right hemi thorax. The visualized osseous structures are notable for mild multi level thoracic spondylosis. IMPRESSION: 1. Complete opacification of the right hemi thorax is noted. Assuming no prior surgical history of pneumonectomy, this may be due to a large right pleural effusion, atelectasis of the entire right lung (perhaps secondary to central obstructing mass) or diffuse pneumonia. Further investigation with contrast enhanced CT of the chest is recommended. Electronically Signed   By: Signa Kell M.D.   On: 12/24/2015 11:23   Dg Shoulder Right  Result Date: 12/24/2015 CLINICAL DATA:  Syncopal episodes over the last week. Shortness of breath and chest pain.  Fell this morning with shoulder pain. EXAM: RIGHT SHOULDER - 2+ VIEW COMPARISON:  None. FINDINGS: There is no evidence of fracture or dislocation. There is no evidence of arthropathy or other focal bone abnormality. Soft tissues are unremarkable. IMPRESSION: Negative. Electronically Signed   By: Paulina Fusi M.D.   On: 12/24/2015 11:26   Dg Forearm Right  Result Date: 12/24/2015 CLINICAL DATA:  Larey Seat this morning.  Mid forearm pain. EXAM: RIGHT FOREARM - 2 VIEW COMPARISON:  None. FINDINGS: Negative for radial or ulnar fracture. IMPRESSION: Negative. Electronically Signed   By: Paulina Fusi M.D.   On: 12/24/2015 11:31   Ct Head Wo Contrast  Result Date: 12/24/2015 CLINICAL DATA:  Fall.  Syncope. EXAM: CT HEAD WITHOUT CONTRAST CT CERVICAL SPINE WITHOUT CONTRAST TECHNIQUE: Multidetector CT imaging of the head and cervical spine was performed following the standard protocol without intravenous contrast. Multiplanar CT image reconstructions of the cervical spine were also generated. COMPARISON:  None. FINDINGS: CT HEAD FINDINGS Ventricle size normal.  Cerebral volume normal. Negative for acute or chronic infarction. Negative for hemorrhage or mass Negative for skull fracture. CT CERVICAL SPINE FINDINGS Normal cervical alignment.  Negative for fracture Mild disc and facet degeneration in the cervical spine. No significant spinal stenosis Large right pleural effusion. Right upper lobe is not aerated due to the effusion. Left upper lobe is clear. IMPRESSION: Negative CT of the head Negative for cervical spine fracture Large right pleural effusion. Electronically Signed   By: Marlan Palau M.D.   On: 12/24/2015 11:53   Ct Chest W Contrast  Result Date: 12/24/2015 CLINICAL DATA:  55 year old male with history of cough, shortness breath and 20 pound weight loss over the past month. EXAM: CT CHEST WITH CONTRAST TECHNIQUE: Multidetector CT imaging of the chest was performed during intravenous contrast administration.  CONTRAST:  75mL ISOVUE-300 IOPAMIDOL (ISOVUE-300) INJECTION 61% COMPARISON:  No priors. FINDINGS: Cardiovascular: Heart size is normal. There is no significant pericardial fluid, thickening or pericardial calcification. Calcifications of the aortic valve. Aortic atherosclerosis. Bilateral atria appear compressed. Mediastinum/Nodes: No pathologically enlarged mediastinal or hilar lymph nodes. Esophagus is unremarkable in appearance. 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 No axillary lymphadenopathy. Lungs/Pleura: Massive right-sided pleural effusion occupying the entire volume of the right hemithorax with associated complete atelectasis of the right lung. This is exerting mass effect slightly deviating the cardiomediastinal structures to into the left hemithorax. Within the collapsed right lung, no discrete pulmonary nodule or mass is confidently identified. Although assessment is slightly limited by patient motion, no  obstructing endobronchial lesion is identified. A few patchy areas of ground-glass attenuation are noted in the left upper lobe near the apex. No confluent consolidative airspace disease in the left lung. No left pleural effusion. Upper Abdomen: Visualized portions of the liver demonstrate a shrunken appearance and very nodular contour, compatible with underlying cirrhosis. Moderate volume of perihepatic ascites. Recannulized paraumbilical vein. Numerous portosystemic collateral vessels throughout the upper abdomen, most notable for proximal gastric varices. Spleen is incompletely visualized, but appears at least borderline enlarged. Portal vein is dilated measuring 15 mm in diameter. Musculoskeletal: There are no aggressive appearing lytic or blastic lesions noted in the visualized portions of the skeleton. IMPRESSION: 1. Massive right-sided pleural effusion which appears to potentially be under tension, as demonstrated by deviation of cardiomediastinal structures into the left hemithorax, and  apparent atrial compression. It is unusual for such a large pleural effusion to be present in the absence of underlying malignancy (no definite malignancy confidently identified on today's CT examination). Correlation with thoracentesis is recommended to assess for malignant cells, and to provide symptomatic relief if clinically appropriate. 2. Patchy areas of ground-glass attenuation in the left upper lobe near the apex. These are nonspecific, and presumably infectious/inflammatory in etiology. 3. Cirrhosis with stigmata of portal hypertension, including distal esophageal and proximal gastric varices, as above. 4. Moderate volume of ascites. 5. Aortic atherosclerosis. 6. There are calcifications of the aortic valve. Echocardiographic correlation for evaluation of potential valvular dysfunction may be warranted if clinically indicated. Electronically Signed   By: Trudie Reed M.D.   On: 12/24/2015 12:10   Ct Cervical Spine Wo Contrast  Result Date: 12/24/2015 CLINICAL DATA:  Fall.  Syncope. EXAM: CT HEAD WITHOUT CONTRAST CT CERVICAL SPINE WITHOUT CONTRAST TECHNIQUE: Multidetector CT imaging of the head and cervical spine was performed following the standard protocol without intravenous contrast. Multiplanar CT image reconstructions of the cervical spine were also generated. COMPARISON:  None. FINDINGS: CT HEAD FINDINGS Ventricle size normal.  Cerebral volume normal. Negative for acute or chronic infarction. Negative for hemorrhage or mass Negative for skull fracture. CT CERVICAL SPINE FINDINGS Normal cervical alignment.  Negative for fracture Mild disc and facet degeneration in the cervical spine. No significant spinal stenosis Large right pleural effusion. Right upper lobe is not aerated due to the effusion. Left upper lobe is clear. IMPRESSION: Negative CT of the head Negative for cervical spine fracture Large right pleural effusion. Electronically Signed   By: Marlan Palau M.D.   On: 12/24/2015 11:53    US Thoracentesis Asp Pleural Space W/img Guide  Result Date: 12/24/2015 INDICATION: Smoker with history of cough, dyspnea, weight loss, cirrhosis by imaging, ascites, large right pleural effusion. Request made for diagnostic and therapeutic right thoracentesis. EXAM: ULTRASOUND GUIDED DIAGNOSTIC AND THERAPEUTIC RIGHT THORACENTESIS MEDICATIONS: None. COMPLICATIONS: None immediate. PROCEDURE: An ultrasound guided thoracentesis was thoroughly discussed with the patient and questions answered. The benefits, risks, alternatives and complications were also discussed. The patient understands and wishes to proceed with the procedure. Written consent was obtained. Ultrasound was performed to localize and mark an adequate pocket of fluid in the right chest. The area was then prepped and draped in the normal sterile fashion. 1% Lidocaine was used for local anesthesia. Under ultrasound guidance a Safe-T-Centesis catheter was introduced. Thoracentesis was performed. The catheter was removed and a dressing applied. FINDINGS: A total of approximately 2 liters of yellow fluid was removed. Samples were sent to the laboratory as requested by the clinical team. Due to this being patient's first  thoracentesis only the above amount of fluid was removed at this time. IMPRESSION: Successful ultrasound guided diagnostic and therapeutic right thoracentesis yielding 2 liters of pleural fluid. Read by: Jeananne Rama, PA-C Electronically Signed   By: Malachy Moan M.D.   On: 12/24/2015 16:10        Scheduled Meds: . folic acid  1 mg Oral Daily  . furosemide  40 mg Oral Daily  . multivitamin with minerals  1 tablet Oral Daily  . potassium chloride  40 mEq Oral Q6H  . sodium chloride flush  3 mL Intravenous Q12H  . sodium chloride flush  3 mL Intravenous Q12H  . spironolactone  100 mg Oral Daily  . thiamine  100 mg Oral Daily   Continuous Infusions:    LOS: 1 day    Time spent: 35 minutes    Jeralyn Bennett,  MD [facility] Pager [phone]  If 7PM-7AM, please contact night-coverage [email or web address] Password [facility] 12/25/2015, 3:44 PM

## 2015-12-25 NOTE — Procedures (Signed)
Ultrasound-guided therapeutic right thoracentesis performed yielding 2 liters of yellow colored fluid. No immediate complications. Follow-up chest x-ray pending.      Connor Vaughn E 4:07 PM 12/25/2015

## 2015-12-25 NOTE — Consult Note (Signed)
Reason for Consult: Right hepatic hydrothorax and SOB Referring Physician: [facility]  Connor Vaughn HPI: This is a 55 year old male with a PMH of ETOH abuse admitted for complaints of SOB over the past 3-4 weeks.  The symptoms were progressive, but it markedly worsened over the past week.  As a result of his symptoms he presented to the ER.  A CT scan was performed of his chest and it was significant for a right pleural effusion.  The effusion is felt to be secondary to his cirrhotic state as there is no overt evidence of infection or malignancy.  A thoracentesis was performed and it appears to be transudative.  The dome of the liver was consistent with cirrhosis and perihepatic ascites.  He has a long history of ETOH abuse since his 20's, however, within the past 3 years or so he has increased his intake.  He can vary from 1-3 days of drinking a pint of liquor, which was his way of dealing with stress and anxiety.  But he states that he has not drank any ETOH over the past 25 days.  No reports of any bleeding, but gastric and esophageal varices were noted on the CT scan.  History reviewed. No pertinent past medical history.  History reviewed. No pertinent surgical history.  No family history on file.  Social History:  reports that he has quit smoking. He has never used smokeless tobacco. He reports that he drinks alcohol. He reports that he does not use drugs.  Allergies:  Allergies  Allergen Reactions  . Latex Rash    Only on hands.    Medications:  Scheduled: . folic acid  1 mg Oral Daily  . furosemide  40 mg Oral Daily  . multivitamin with minerals  1 tablet Oral Daily  . potassium chloride  40 mEq Oral Q6H  . sodium chloride flush  3 mL Intravenous Q12H  . sodium chloride flush  3 mL Intravenous Q12H  . spironolactone  100 mg Oral Daily  . thiamine  100 mg Oral Daily   Continuous:   Results for orders placed or performed during the hospital encounter of 12/24/15  (from the past 24 hour(s))  Lactate dehydrogenase (CSF, pleural or peritoneal fluid)     Status: Abnormal   Collection Time: 12/24/15  4:00 PM  Result Value Ref Range   LD, Fluid 39 (H) 3 - 23 U/L   Fluid Type-FLDH Pleural R   Protein, pleural or peritoneal fluid     Status: None   Collection Time: 12/24/15  4:00 PM  Result Value Ref Range   Total protein, fluid <3.0 g/dL   Fluid Type-FTP PLEURAL   Body fluid cell count with differential     Status: Abnormal   Collection Time: 12/24/15  4:00 PM  Result Value Ref Range   Fluid Type-FCT Pleural R    Color, Fluid YELLOW YELLOW   Appearance, Fluid HAZY (A) CLEAR   WBC, Fluid 246 0 - 1,000 cu mm   Neutrophil Count, Fluid 34 (H) 0 - 25 %   Lymphs, Fluid 9 %   Monocyte-Macrophage-Serous Fluid 57 50 - 90 %   Eos, Fluid 0 %   Other Cells, Fluid OTHER CELLS IDENTIFIED AS MESOTHELIAL CELLS %  Glucose, pleural or peritoneal fluid     Status: None   Collection Time: 12/24/15  4:00 PM  Result Value Ref Range   Glucose, Fluid 145 mg/dL   Fluid Type-FGLU PLEURAL   Culture, body  fluid-bottle     Status: None (Preliminary result)   Collection Time: 12/24/15  4:00 PM  Result Value Ref Range   Specimen Description FLUID PLEURAL RIGHT    Special Requests BOTTLES DRAWN AEROBIC AND ANAEROBIC 10CC    Gram Stain      WBC PRESENT,BOTH PMN AND MONONUCLEAR NO ORGANISMS SEEN    Culture      NO GROWTH < 24 HOURS Performed at [facility]    Report Status PENDING   CBC     Status: Abnormal   Collection Time: 12/25/15  5:21 AM  Result Value Ref Range   WBC 7.0 4.0 - 10.5 K/uL   RBC 4.16 (L) 4.22 - 5.81 MIL/uL   Hemoglobin 13.9 13.0 - 17.0 g/dL   HCT 43.1 54.0 - 08.6 %   MCV 96.6 78.0 - 100.0 fL   MCH 33.4 26.0 - 34.0 pg   MCHC 34.6 30.0 - 36.0 g/dL   RDW 76.1 95.0 - 93.2 %   Platelets 101 (L) 150 - 400 K/uL  Comprehensive metabolic panel     Status: Abnormal   Collection Time: 12/25/15  5:21 AM  Result Value Ref Range   Sodium 136 135  - 145 mmol/L   Potassium 3.1 (L) 3.5 - 5.1 mmol/L   Chloride 99 (L) 101 - 111 mmol/L   CO2 31 22 - 32 mmol/L   Glucose, Bld 108 (H) 65 - 99 mg/dL   BUN 9 6 - 20 mg/dL   Creatinine, Ser 6.71 0.61 - 1.24 mg/dL   Calcium 8.3 (L) 8.9 - 10.3 mg/dL   Total Protein 6.1 (L) 6.5 - 8.1 g/dL   Albumin 2.6 (L) 3.5 - 5.0 g/dL   AST 58 (H) 15 - 41 U/L   ALT 30 17 - 63 U/L   Alkaline Phosphatase 111 38 - 126 U/L   Total Bilirubin 3.4 (H) 0.3 - 1.2 mg/dL   GFR calc non Af Amer >60 >60 mL/min   GFR calc Af Amer >60 >60 mL/min   Anion gap 6 5 - 15  Brain natriuretic peptide     Status: None   Collection Time: 12/25/15  5:21 AM  Result Value Ref Range   B Natriuretic Peptide 62.2 0.0 - 100.0 pg/mL     Dg Chest 1 View  Result Date: 12/24/2015 CLINICAL DATA:  Status post right-sided thoracentesis. EXAM: CHEST 1 VIEW COMPARISON:  Chest CT 12/24/2015 FINDINGS: There is a persistent large right pleural effusion. Some central aerated right lung is now visualized. The left lung remains clear. IMPRESSION: Persistent large right pleural effusion. Some central lung aeration is identified now. Electronically Signed   By: Rudie Meyer M.D.   On: 12/24/2015 16:11   Dg Chest 2 View  Result Date: 12/24/2015 CLINICAL DATA:  Syncopal episode for 1 week. EXAM: CHEST  2 VIEW COMPARISON:  None FINDINGS: Normal heart size. The left lung is clear. There is complete opacification of the right hemi thorax. The visualized osseous structures are notable for mild multi level thoracic spondylosis. IMPRESSION: 1. Complete opacification of the right hemi thorax is noted. Assuming no prior surgical history of pneumonectomy, this may be due to a large right pleural effusion, atelectasis of the entire right lung (perhaps secondary to central obstructing mass) or diffuse pneumonia. Further investigation with contrast enhanced CT of the chest is recommended. Electronically Signed   By: Signa Kell M.D.   On: 12/24/2015 11:23   Dg  Shoulder Right  Result Date: 12/24/2015 CLINICAL  DATA:  Syncopal episodes over the last week. Shortness of breath and chest pain. Fell this morning with shoulder pain. EXAM: RIGHT SHOULDER - 2+ VIEW COMPARISON:  None. FINDINGS: There is no evidence of fracture or dislocation. There is no evidence of arthropathy or other focal bone abnormality. Soft tissues are unremarkable. IMPRESSION: Negative. Electronically Signed   By: Paulina Fusi M.D.   On: 12/24/2015 11:26   Dg Forearm Right  Result Date: 12/24/2015 CLINICAL DATA:  Larey Seat this morning.  Mid forearm pain. EXAM: RIGHT FOREARM - 2 VIEW COMPARISON:  None. FINDINGS: Negative for radial or ulnar fracture. IMPRESSION: Negative. Electronically Signed   By: Paulina Fusi M.D.   On: 12/24/2015 11:31   Ct Head Wo Contrast  Result Date: 12/24/2015 CLINICAL DATA:  Fall.  Syncope. EXAM: CT HEAD WITHOUT CONTRAST CT CERVICAL SPINE WITHOUT CONTRAST TECHNIQUE: Multidetector CT imaging of the head and cervical spine was performed following the standard protocol without intravenous contrast. Multiplanar CT image reconstructions of the cervical spine were also generated. COMPARISON:  None. FINDINGS: CT HEAD FINDINGS Ventricle size normal.  Cerebral volume normal. Negative for acute or chronic infarction. Negative for hemorrhage or mass Negative for skull fracture. CT CERVICAL SPINE FINDINGS Normal cervical alignment.  Negative for fracture Mild disc and facet degeneration in the cervical spine. No significant spinal stenosis Large right pleural effusion. Right upper lobe is not aerated due to the effusion. Left upper lobe is clear. IMPRESSION: Negative CT of the head Negative for cervical spine fracture Large right pleural effusion. Electronically Signed   By: Marlan Palau M.D.   On: 12/24/2015 11:53   Ct Chest W Contrast  Result Date: 12/24/2015 CLINICAL DATA:  55 year old male with history of cough, shortness breath and 20 pound weight loss over the past month. EXAM:  CT CHEST WITH CONTRAST TECHNIQUE: Multidetector CT imaging of the chest was performed during intravenous contrast administration. CONTRAST:  75mL ISOVUE-300 IOPAMIDOL (ISOVUE-300) INJECTION 61% COMPARISON:  No priors. FINDINGS: Cardiovascular: Heart size is normal. There is no significant pericardial fluid, thickening or pericardial calcification. Calcifications of the aortic valve. Aortic atherosclerosis. Bilateral atria appear compressed. Mediastinum/Nodes: No pathologically enlarged mediastinal or hilar lymph nodes. Esophagus is unremarkable in appearance. 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 No axillary lymphadenopathy. Lungs/Pleura: Massive right-sided pleural effusion occupying the entire volume of the right hemithorax with associated complete atelectasis of the right lung. This is exerting mass effect slightly deviating the cardiomediastinal structures to into the left hemithorax. Within the collapsed right lung, no discrete pulmonary nodule or mass is confidently identified. Although assessment is slightly limited by patient motion, no obstructing endobronchial lesion is identified. A few patchy areas of ground-glass attenuation are noted in the left upper lobe near the apex. No confluent consolidative airspace disease in the left lung. No left pleural effusion. Upper Abdomen: Visualized portions of the liver demonstrate a shrunken appearance and very nodular contour, compatible with underlying cirrhosis. Moderate volume of perihepatic ascites. Recannulized paraumbilical vein. Numerous portosystemic collateral vessels throughout the upper abdomen, most notable for proximal gastric varices. Spleen is incompletely visualized, but appears at least borderline enlarged. Portal vein is dilated measuring 15 mm in diameter. Musculoskeletal: There are no aggressive appearing lytic or blastic lesions noted in the visualized portions of the skeleton. IMPRESSION: 1. Massive right-sided pleural effusion which appears  to potentially be under tension, as demonstrated by deviation of cardiomediastinal structures into the left hemithorax, and apparent atrial compression. It is unusual for such a large pleural effusion to be present  in the absence of underlying malignancy (no definite malignancy confidently identified on today's CT examination). Correlation with thoracentesis is recommended to assess for malignant cells, and to provide symptomatic relief if clinically appropriate. 2. Patchy areas of ground-glass attenuation in the left upper lobe near the apex. These are nonspecific, and presumably infectious/inflammatory in etiology. 3. Cirrhosis with stigmata of portal hypertension, including distal esophageal and proximal gastric varices, as above. 4. Moderate volume of ascites. 5. Aortic atherosclerosis. 6. There are calcifications of the aortic valve. Echocardiographic correlation for evaluation of potential valvular dysfunction may be warranted if clinically indicated. Electronically Signed   By: Trudie Reed M.D.   On: 12/24/2015 12:10   Ct Cervical Spine Wo Contrast  Result Date: 12/24/2015 CLINICAL DATA:  Fall.  Syncope. EXAM: CT HEAD WITHOUT CONTRAST CT CERVICAL SPINE WITHOUT CONTRAST TECHNIQUE: Multidetector CT imaging of the head and cervical spine was performed following the standard protocol without intravenous contrast. Multiplanar CT image reconstructions of the cervical spine were also generated. COMPARISON:  None. FINDINGS: CT HEAD FINDINGS Ventricle size normal.  Cerebral volume normal. Negative for acute or chronic infarction. Negative for hemorrhage or mass Negative for skull fracture. CT CERVICAL SPINE FINDINGS Normal cervical alignment.  Negative for fracture Mild disc and facet degeneration in the cervical spine. No significant spinal stenosis Large right pleural effusion. Right upper lobe is not aerated due to the effusion. Left upper lobe is clear. IMPRESSION: Negative CT of the head Negative for  cervical spine fracture Large right pleural effusion. Electronically Signed   By: Marlan Palau M.D.   On: 12/24/2015 11:53   US Thoracentesis Asp Pleural Space W/img Guide  Result Date: 12/24/2015 INDICATION: Smoker with history of cough, dyspnea, weight loss, cirrhosis by imaging, ascites, large right pleural effusion. Request made for diagnostic and therapeutic right thoracentesis. EXAM: ULTRASOUND GUIDED DIAGNOSTIC AND THERAPEUTIC RIGHT THORACENTESIS MEDICATIONS: None. COMPLICATIONS: None immediate. PROCEDURE: An ultrasound guided thoracentesis was thoroughly discussed with the patient and questions answered. The benefits, risks, alternatives and complications were also discussed. The patient understands and wishes to proceed with the procedure. Written consent was obtained. Ultrasound was performed to localize and mark an adequate pocket of fluid in the right chest. The area was then prepped and draped in the normal sterile fashion. 1% Lidocaine was used for local anesthesia. Under ultrasound guidance a Safe-T-Centesis catheter was introduced. Thoracentesis was performed. The catheter was removed and a dressing applied. FINDINGS: A total of approximately 2 liters of yellow fluid was removed. Samples were sent to the laboratory as requested by the clinical team. Due to this being patient's first thoracentesis only the above amount of fluid was removed at this time. IMPRESSION: Successful ultrasound guided diagnostic and therapeutic right thoracentesis yielding 2 liters of pleural fluid. Read by: Jeananne Rama, PA-C Electronically Signed   By: Malachy Moan M.D.   On: 12/24/2015 16:10    ROS:  As stated above in the HPI otherwise negative.  Blood pressure 128/72, pulse (!) 103, temperature 97.9 F (36.6 C), temperature source Oral, resp. rate 16, height 5\' 6"  (1.676 m), weight 82.8 kg (182 lb 9.6 oz), SpO2 94 %.    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  No obvious malignancy or infectious process. 2) ETOH cirrhosis. 3) Ascites.  4) Varices.   The thoracentesis does help to relieve tension and will allow him to breath better, however, the mainstay of treatment is to focus on controlling the ascites.  I agree with the Step I diuretics and the 2 gram sodium diet.  He can undergo a repeat thoracentesis to help with breathing, but NO chest tube should be placed or Pleurx.  This will only worsen the situation.  Plan: 1) Step I diuretics and 2 gram sodium diet. 2) No ETOH and he appears to have a strong commitment to this new lifestyle. 3) Outpatient EGD to assess the varices.  Charlize Hathaway D 12/25/2015, 1:53 PM

## 2015-12-25 NOTE — Progress Notes (Signed)
  Echocardiogram 2D Echocardiogram has been performed.  Leta Jungling M 12/25/2015, 2:48 PM

## 2015-12-26 ENCOUNTER — Inpatient Hospital Stay (HOSPITAL_COMMUNITY): Payer: BLUE CROSS/BLUE SHIELD

## 2015-12-26 DIAGNOSIS — K7030 Alcoholic cirrhosis of liver without ascites: Secondary | ICD-10-CM

## 2015-12-26 DIAGNOSIS — K7031 Alcoholic cirrhosis of liver with ascites: Secondary | ICD-10-CM

## 2015-12-26 DIAGNOSIS — J90 Pleural effusion, not elsewhere classified: Principal | ICD-10-CM

## 2015-12-26 LAB — HEPATITIS PANEL, ACUTE
HCV Ab: >11 s/co ratio — ABNORMAL HIGH (ref 0.0–0.9)
Hep A IgM: NEGATIVE
Hep B C IgM: NEGATIVE
Hepatitis B Surface Ag: NEGATIVE

## 2015-12-26 LAB — COMPREHENSIVE METABOLIC PANEL
ALT: 26 U/L (ref 17–63)
AST: 56 U/L — ABNORMAL HIGH (ref 15–41)
Albumin: 2.4 g/dL — ABNORMAL LOW (ref 3.5–5.0)
Alkaline Phosphatase: 108 U/L (ref 38–126)
Anion gap: 5 (ref 5–15)
BUN: 8 mg/dL (ref 6–20)
CO2: 30 mmol/L (ref 22–32)
Calcium: 8.2 mg/dL — ABNORMAL LOW (ref 8.9–10.3)
Chloride: 102 mmol/L (ref 101–111)
Creatinine, Ser: 0.85 mg/dL (ref 0.61–1.24)
GFR calc Af Amer: >60 mL/min (ref >60)
GFR calc non Af Amer: >60 mL/min (ref >60)
Glucose, Bld: 116 mg/dL — ABNORMAL HIGH (ref 65–99)
Potassium: 3.6 mmol/L (ref 3.5–5.1)
Sodium: 137 mmol/L (ref 135–145)
Total Bilirubin: 2.2 mg/dL — ABNORMAL HIGH (ref 0.3–1.2)
Total Protein: 5.7 g/dL — ABNORMAL LOW (ref 6.5–8.1)

## 2015-12-26 LAB — CBC
HCT: 39.6 % (ref 39.0–52.0)
Hemoglobin: 13.5 g/dL (ref 13.0–17.0)
MCH: 33 pg (ref 26.0–34.0)
MCHC: 34.1 g/dL (ref 30.0–36.0)
MCV: 96.8 fL (ref 78.0–100.0)
Platelets: 104 10*3/uL — ABNORMAL LOW (ref 150–400)
RBC: 4.09 MIL/uL — ABNORMAL LOW (ref 4.22–5.81)
RDW: 14.2 % (ref 11.5–15.5)
WBC: 6.3 10*3/uL (ref 4.0–10.5)

## 2015-12-26 MED ORDER — FUROSEMIDE 40 MG PO TABS: 40.0000 mg | ORAL_TABLET | Freq: Every day | ORAL | 1 refills | Status: DC

## 2015-12-26 MED ORDER — BISACODYL 10 MG RE SUPP
10.0000 mg | Freq: Once | RECTAL | Status: AC
  Administered 2015-12-26: 10 mg via RECTAL
  Filled 2015-12-26: qty 1

## 2015-12-26 MED ORDER — BENZONATATE 100 MG PO CAPS
100.0000 mg | ORAL_CAPSULE | Freq: Three times a day (TID) | ORAL | Status: DC | PRN
  Administered 2015-12-26: 100 mg via ORAL
  Filled 2015-12-26: qty 1

## 2015-12-26 MED ORDER — LORAZEPAM 0.5 MG PO TABS: 0.5000 mg | ORAL_TABLET | Freq: Once | ORAL | Status: DC

## 2015-12-26 MED ORDER — LORAZEPAM 0.5 MG PO TABS: 0.5000 mg | ORAL_TABLET | Freq: Two times a day (BID) | ORAL | 0 refills | Status: DC | PRN

## 2015-12-26 MED ORDER — SPIRONOLACTONE 100 MG PO TABS: 100.0000 mg | ORAL_TABLET | Freq: Every day | ORAL | 1 refills | Status: DC

## 2015-12-26 MED ORDER — BENZONATATE 100 MG PO CAPS: 100.0000 mg | ORAL_CAPSULE | Freq: Three times a day (TID) | ORAL | 0 refills | Status: DC | PRN

## 2015-12-26 NOTE — Discharge Summary (Addendum)
Physician Discharge Summary  Connor Vaughn:[id number] DOB: 05-09-1961 DOA: 12/24/2015  PCP: Lonia Skinner, MD  Admit date: 12/24/2015 Discharge date: 12/26/2015  Time spent: 35 minutes  Recommendations for Outpatient Follow-up:  1. 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 She was seen by Dr Elnoria Howard during this hospitalization  2. Follow up on blood pressures   Discharge Diagnoses:  Active Problems:   Pleural effusion   Pleural effusion, right   Alcohol abuse   Syncope   Discharge Condition:   Diet recommendation: Low Sodium Diet  Filed Weights   12/24/15 1445  Weight: 82.8 kg (182 lb 9.6 oz)    History of present illness:  Connor Vaughn is a 55 y.o. male with medical history significant of alcohol abuse, presented to the hospital for evaluation of the above-noted complaints. Per patient, over the past 3-4 weeks he has noted shortness of breath that has gradually developed. This was initially mostly exertional, however now it is even at rest and with very minimal exertion. Shortness of that has markedly worsened over the past 1 week. This is been associated with a cough that is mostly dry. Patient claims that on 3 occasions over the past 1 week, he has had paroxysmal as of cough at the end of which he has had a syncopal episode. His last syncopal episode was yesterday afternoon. Because of ongoing worsening symptoms, he presented to the ED today and was found to have a large right-sided pleural effusion. I was subsequently asked to admit this patient for further evaluation and treatment.  Hospital Course:  [name] is a 55 year old gentleman with a past medical history of alcohol abuse, presented to the emergency room on 12/24/2015 with complaints of shortness of breath and syncope. She workup included a CT scan of chest with IV contrast that revealed massive right-sided pleural effusion  appeared to be under tension with deviation of cardiomediastinal structures into the left hemithorax. Interventional radiology was consulted as he underwent ultrasound-guided thoracentesis with removal of 2 L of pleuritic fluid. Fluid analysis consistent with transudate of effusion. 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 I suspect that he had decreased cardiac output from compression on his heart in setting of massive right-sided pleural effusion.   .  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  -On 12/25/2015 he had another ultrasound-guided thoracentesis with removal of 2L of pleural fluid -He was started on Lasix and Spironolactone during this hospitalization   2.  Syncope --Suspect this was related to decreased cardiac output from compression from massive right-sided pleural effusion. Radiology reported that this lead to deviation of cardiomediastinal structures and atrial compression. -Status post ultrasound-guided thoracentesis with removal of a total of 4 L of fluid  3.  Cirrhosis -CT scan showing stigmata of portal hypertension as well as distal esophageal and proximal gastric varices. -Patient was extensively counseled on alcohol abuse, he seems intent on getting help -He was started on diuretic therapy with spironolactone 100 mg by mouth daily  and Lasix 40 mg by mouth daily -Dr Elnoria Howard of GI consulted will be set up for outpatient EGD -Right upper  quadrant ultrasound showed cirrhosis  Consultations:  U/S guided thoracentesis x2 with removal of a total of 4 L of pleural fluid.   Discharge Exam: Vitals:   12/26/15 0432 12/26/15 1212  BP: 113/80 (!) 125/91  Pulse: (!) 101 (!) 114  Resp: 20 20  Temp: 98 F (36.7 C) 98.8 F (37.1 C)    General exam: Nontoxic appearing, does not appear to be in respiratory distress, seems a little anxious Respiratory system: Interim improvement to lung exam Cardiovascular system: S1 & S2 heard, RRR. No JVD, murmurs, rubs, gallops or clicks. No pedal edema. Gastrointestinal system: Abdomen is perhaps mildly distended soft and nontender. No organomegaly or masses felt. Normal bowel sounds heard. Central nervous system: Alert and oriented. No focal neurological deficits. Extremities: Symmetric 5 x 5 power. Skin: No rashes, lesions or ulcers Psychiatry: Anxious    Discharge Instructions   Discharge Instructions    Body fluid culture with gram stain    Complete by:  As directed   Call MD for:    Complete by:  As directed   Call MD for:  difficulty breathing, headache or visual disturbances    Complete by:  As directed   Call MD for:  extreme fatigue    Complete by:  As directed   Call MD for:  hives    Complete by:  As directed   Call MD for:  persistant dizziness or light-headedness    Complete by:  As directed   Call MD for:  persistant nausea and vomiting    Complete by:  As directed   Call MD for:  redness, tenderness, or signs of infection (pain, swelling, redness, odor or green/yellow discharge around incision site)    Complete by:  As directed   Call MD for:  severe uncontrolled pain    Complete by:  As directed   Call MD for:  temperature >100.4    Complete by:  As directed   Diet - low sodium heart healthy    Complete by:  As directed   Increase activity slowly    Complete by:  As directed     Current Discharge Medication List    START taking these medications   Details   benzonatate (TESSALON) 100 MG capsule Take 1 capsule (100 mg total) by mouth 3 (three) times daily as needed for cough. Qty: 20 capsule, Refills: 0    furosemide (LASIX) 40 MG tablet Take 1 tablet (40 mg total) by mouth daily. Qty: 30 tablet, Refills: 1    LORazepam (ATIVAN) 0.5 MG tablet Take 1 tablet (0.5 mg total) by mouth 2 (two) times daily as needed for anxiety. Qty: 15 tablet, Refills: 0    spironolactone (ALDACTONE) 100 MG tablet Take 1 tablet (100 mg total) by mouth daily. Qty: 30 tablet, Refills: 1      CONTINUE these medications which have NOT CHANGED   Details  Dextromethorphan-Guaifenesin (MUCUS RELIEF DM COUGH PO) Take 1 tablet by mouth every 4 (four) hours as needed (for cough).       Allergies  Allergen Reactions  . Latex Rash    Only on hands.      The results of significant diagnostics from this hospitalization (including imaging, microbiology, ancillary and laboratory) are listed below for reference.    Significant Diagnostic Studies: Dg Chest 1 View  Result Date: 12/25/2015 CLINICAL DATA:  Status  post right thoracentesis. EXAM: CHEST 1 VIEW COMPARISON:  December 24, 2015 FINDINGS: The right-sided pleural effusion is smaller in the interval but still fills approximately half of the right hemi thorax. No pneumothorax. The left lung remains clear. No other interval changes. IMPRESSION: No pneumothorax after thoracentesis. Significant effusion remains on the right but it is smaller in the interval. Electronically Signed   By: Gerome Sam III M.D   On: 12/25/2015 16:33   Dg Chest 1 View  Result Date: 12/24/2015 CLINICAL DATA:  Status post right-sided thoracentesis. EXAM: CHEST 1 VIEW COMPARISON:  Chest CT 12/24/2015 FINDINGS: There is a persistent large right pleural effusion. Some central aerated right lung is now visualized. The left lung remains clear. IMPRESSION: Persistent large right pleural effusion. Some central lung aeration is identified now.  Electronically Signed   By: Rudie Meyer M.D.   On: 12/24/2015 16:11   Dg Chest 2 View  Result Date: 12/24/2015 CLINICAL DATA:  Syncopal episode for 1 week. EXAM: CHEST  2 VIEW COMPARISON:  None FINDINGS: Normal heart size. The left lung is clear. There is complete opacification of the right hemi thorax. The visualized osseous structures are notable for mild multi level thoracic spondylosis. IMPRESSION: 1. Complete opacification of the right hemi thorax is noted. Assuming no prior surgical history of pneumonectomy, this may be due to a large right pleural effusion, atelectasis of the entire right lung (perhaps secondary to central obstructing mass) or diffuse pneumonia. Further investigation with contrast enhanced CT of the chest is recommended. Electronically Signed   By: Signa Kell M.D.   On: 12/24/2015 11:23   Dg Shoulder Right  Result Date: 12/24/2015 CLINICAL DATA:  Syncopal episodes over the last week. Shortness of breath and chest pain. Fell this morning with shoulder pain. EXAM: RIGHT SHOULDER - 2+ VIEW COMPARISON:  None. FINDINGS: There is no evidence of fracture or dislocation. There is no evidence of arthropathy or other focal bone abnormality. Soft tissues are unremarkable. IMPRESSION: Negative. Electronically Signed   By: Paulina Fusi M.D.   On: 12/24/2015 11:26   Dg Forearm Right  Result Date: 12/24/2015 CLINICAL DATA:  Larey Seat this morning.  Mid forearm pain. EXAM: RIGHT FOREARM - 2 VIEW COMPARISON:  None. FINDINGS: Negative for radial or ulnar fracture. IMPRESSION: Negative. Electronically Signed   By: Paulina Fusi M.D.   On: 12/24/2015 11:31   Ct Head Wo Contrast  Result Date: 12/24/2015 CLINICAL DATA:  Fall.  Syncope. EXAM: CT HEAD WITHOUT CONTRAST CT CERVICAL SPINE WITHOUT CONTRAST TECHNIQUE: Multidetector CT imaging of the head and cervical spine was performed following the standard protocol without intravenous contrast. Multiplanar CT image reconstructions of the cervical spine  were also generated. COMPARISON:  None. FINDINGS: CT HEAD FINDINGS Ventricle size normal.  Cerebral volume normal. Negative for acute or chronic infarction. Negative for hemorrhage or mass Negative for skull fracture. CT CERVICAL SPINE FINDINGS Normal cervical alignment.  Negative for fracture Mild disc and facet degeneration in the cervical spine. No significant spinal stenosis Large right pleural effusion. Right upper lobe is not aerated due to the effusion. Left upper lobe is clear. IMPRESSION: Negative CT of the head Negative for cervical spine fracture Large right pleural effusion. Electronically Signed   By: Marlan Palau M.D.   On: 12/24/2015 11:53   Ct Chest W Contrast  Result Date: 12/24/2015 CLINICAL DATA:  55 year old male with history of cough, shortness breath and 20 pound weight loss over the past month. EXAM: CT CHEST WITH CONTRAST TECHNIQUE:  Multidetector CT imaging of the chest was performed during intravenous contrast administration. CONTRAST:  75mL ISOVUE-300 IOPAMIDOL (ISOVUE-300) INJECTION 61% COMPARISON:  No priors. FINDINGS: Cardiovascular: Heart size is normal. There is no significant pericardial fluid, thickening or pericardial calcification. Calcifications of the aortic valve. Aortic atherosclerosis. Bilateral atria appear compressed. Mediastinum/Nodes: No pathologically enlarged mediastinal or hilar lymph nodes. Esophagus is unremarkable in appearance. 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 No axillary lymphadenopathy. Lungs/Pleura: Massive right-sided pleural effusion occupying the entire volume of the right hemithorax with associated complete atelectasis of the right lung. This is exerting mass effect slightly deviating the cardiomediastinal structures to into the left hemithorax. Within the collapsed right lung, no discrete pulmonary nodule or mass is confidently identified. Although assessment is slightly limited by patient motion, no obstructing endobronchial lesion is  identified. A few patchy areas of ground-glass attenuation are noted in the left upper lobe near the apex. No confluent consolidative airspace disease in the left lung. No left pleural effusion. Upper Abdomen: Visualized portions of the liver demonstrate a shrunken appearance and very nodular contour, compatible with underlying cirrhosis. Moderate volume of perihepatic ascites. Recannulized paraumbilical vein. Numerous portosystemic collateral vessels throughout the upper abdomen, most notable for proximal gastric varices. Spleen is incompletely visualized, but appears at least borderline enlarged. Portal vein is dilated measuring 15 mm in diameter. Musculoskeletal: There are no aggressive appearing lytic or blastic lesions noted in the visualized portions of the skeleton. IMPRESSION: 1. Massive right-sided pleural effusion which appears to potentially be under tension, as demonstrated by deviation of cardiomediastinal structures into the left hemithorax, and apparent atrial compression. It is unusual for such a large pleural effusion to be present in the absence of underlying malignancy (no definite malignancy confidently identified on today's CT examination). Correlation with thoracentesis is recommended to assess for malignant cells, and to provide symptomatic relief if clinically appropriate. 2. Patchy areas of ground-glass attenuation in the left upper lobe near the apex. These are nonspecific, and presumably infectious/inflammatory in etiology. 3. Cirrhosis with stigmata of portal hypertension, including distal esophageal and proximal gastric varices, as above. 4. Moderate volume of ascites. 5. Aortic atherosclerosis. 6. There are calcifications of the aortic valve. Echocardiographic correlation for evaluation of potential valvular dysfunction may be warranted if clinically indicated. Electronically Signed   By: Trudie Reed M.D.   On: 12/24/2015 12:10   Ct Cervical Spine Wo Contrast  Result Date:  12/24/2015 CLINICAL DATA:  Fall.  Syncope. EXAM: CT HEAD WITHOUT CONTRAST CT CERVICAL SPINE WITHOUT CONTRAST TECHNIQUE: Multidetector CT imaging of the head and cervical spine was performed following the standard protocol without intravenous contrast. Multiplanar CT image reconstructions of the cervical spine were also generated. COMPARISON:  None. FINDINGS: CT HEAD FINDINGS Ventricle size normal.  Cerebral volume normal. Negative for acute or chronic infarction. Negative for hemorrhage or mass Negative for skull fracture. CT CERVICAL SPINE FINDINGS Normal cervical alignment.  Negative for fracture Mild disc and facet degeneration in the cervical spine. No significant spinal stenosis Large right pleural effusion. Right upper lobe is not aerated due to the effusion. Left upper lobe is clear. IMPRESSION: Negative CT of the head Negative for cervical spine fracture Large right pleural effusion. Electronically Signed   By: Marlan Palau M.D.   On: 12/24/2015 11:53   US Abdomen Limited Ruq  Result Date: 12/26/2015 CLINICAL DATA:  55 year old male with a history of cirrhosis EXAM: US ABDOMEN LIMITED - RIGHT UPPER QUADRANT COMPARISON:  CT chest 12/24/2015 FINDINGS: Gallbladder: Circumferential mild wall thickening of  the gallbladder. Negative sonographic Murphy sign. Two adjacent hyperechoic foci of the near field gallbladder wall. No pericholecystic fluid. No large hyperechoic stones within the gallbladder lumen. Common bile duct: Diameter: 4 Liver: Nodular liver surface with heterogeneous echotexture, compatible with the given history of cirrhosis an appearance on prior CT study. Left liver lobe not visualized secondary to bowel gas, with no right-sided abnormal focus identified. Incidental imaging of right pleural fluid. Small abdominal ascites. IMPRESSION: Sonographic survey demonstrating changes of cirrhosis, compatible with given history and the appearance of the liver on comparison CT. Note that the left liver  lobe not visualized, and this study does not service a complete ultrasound survey for liver mass. Circumferential gallbladder wall thickening with isolated hyperechoic foci, likely a combination of adenomyomatosis and chronic wall thickening secondary to the adjacent liver disease. Incidental imaging of right pleural fluid and ascites. Signed, Yvone Neu. Loreta Ave, DO [facility] Radiology Electronically Signed   By: Gilmer Mor D.O.   On: 12/26/2015 09:31   US Thoracentesis Asp Pleural Space W/img Guide  Result Date: 12/25/2015 INDICATION: Cirrhosis with right pleural effusion. A 2 L thoracentesis was performed yesterday. Request made for repeat thoracentesis today. EXAM: ULTRASOUND GUIDED THERAPEUTIC THORACENTESIS MEDICATIONS: 1% lidocaine COMPLICATIONS: None immediate. PROCEDURE: An ultrasound guided thoracentesis was thoroughly discussed with the patient and questions answered. The benefits, risks, alternatives and complications were also discussed. The patient understands and wishes to proceed with the procedure. Written consent was obtained. Ultrasound was performed to localize and mark an adequate pocket of fluid in the right chest. The area was then prepped and draped in the normal sterile fashion. 1% Lidocaine was used for local anesthesia. Under ultrasound guidance a Safe-T-Centesis catheter was introduced. Thoracentesis was performed. The catheter was removed and a dressing applied. FINDINGS: A total of approximately 2 L of yellow fluid was removed. IMPRESSION: Successful ultrasound guided right thoracentesis yielding 2 L of pleural fluid. We terminated the procedure after 2 L was obtained due to a maximum limit. Read by: Barnetta Chapel, PA-C Electronically Signed   By: Richarda Overlie M.D.   On: 12/25/2015 16:25   US Thoracentesis Asp Pleural Space W/img Guide  Result Date: 12/24/2015 INDICATION: Smoker with history of cough, dyspnea, weight loss, cirrhosis  by imaging, ascites, large right pleural effusion. Request made for diagnostic and therapeutic right thoracentesis. EXAM: ULTRASOUND GUIDED DIAGNOSTIC AND THERAPEUTIC RIGHT THORACENTESIS MEDICATIONS: None. COMPLICATIONS: None immediate. PROCEDURE: An ultrasound guided thoracentesis was thoroughly discussed with the patient and questions answered. The benefits, risks, alternatives and complications were also discussed. The patient understands and wishes to proceed with the procedure. Written consent was obtained. Ultrasound was performed to localize and mark an adequate pocket of fluid in the right chest. The area was then prepped and draped in the normal sterile fashion. 1% Lidocaine was used for local anesthesia. Under ultrasound guidance a Safe-T-Centesis catheter was introduced. Thoracentesis was performed. The catheter was removed and a dressing applied. FINDINGS: A total of approximately 2 liters of yellow fluid was removed. Samples were sent to the laboratory as requested by the clinical team. Due to this being patient's first thoracentesis only the above amount of fluid was removed at this time. IMPRESSION: Successful ultrasound guided diagnostic and therapeutic right thoracentesis yielding 2 liters of pleural fluid. Read by: Jeananne Rama, PA-C Electronically Signed   By: Malachy Moan M.D.   On: 12/24/2015 16:10    Microbiology: Recent Results (from the past 240 hour(s))  Culture, body fluid-bottle  Status: None (Preliminary result)   Collection Time: 12/24/15  4:00 PM  Result Value Ref Range Status   Specimen Description FLUID PLEURAL RIGHT  Final   Special Requests BOTTLES DRAWN AEROBIC AND ANAEROBIC 10CC  Final   Culture   Final    NO GROWTH < 24 HOURS Performed at [facility]    Report Status PENDING  Incomplete  Gram stain     Status: None   Collection Time: 12/24/15  4:00 PM  Result Value Ref Range Status   Specimen Description FLUID PLEURAL RIGHT  Final   Special  Requests NONE  Final   Gram Stain   Final    WBC PRESENT,BOTH PMN AND MONONUCLEAR NO ORGANISMS SEEN Performed at [facility]    Report Status 12/25/2015 FINAL  Final     Labs: Basic Metabolic Panel:  Recent Labs Lab 12/24/15 1055 12/25/15 0521 12/26/15 0515  NA 134* 136 137  K 3.5 3.1* 3.6  CL 94* 99* 102  CO2 27 31 30   GLUCOSE 153* 108* 116*  BUN 11 9 8   CREATININE 0.87 0.86 0.85  CALCIUM 8.9 8.3* 8.2*   Liver Function Tests:  Recent Labs Lab 12/25/15 0521 12/26/15 0515  AST 58* 56*  ALT 30 26  ALKPHOS 111 108  BILITOT 3.4* 2.2*  PROT 6.1* 5.7*  ALBUMIN 2.6* 2.4*   No results for input(s): LIPASE, AMYLASE in the last 168 hours. No results for input(s): AMMONIA in the last 168 hours. CBC:  Recent Labs Lab 12/24/15 1055 12/25/15 0521 12/26/15 0515  WBC 11.1* 7.0 6.3  NEUTROABS 7.6  --   --   HGB 15.2 13.9 13.5  HCT 44.2 40.2 39.6  MCV 96.7 96.6 96.8  PLT 149* 101* 104*   Cardiac Enzymes: No results for input(s): CKTOTAL, CKMB, CKMBINDEX, TROPONINI in the last 168 hours. BNP: BNP (last 3 results)  Recent Labs  12/25/15 0521  BNP 62.2    ProBNP (last 3 results) No results for input(s): PROBNP in the last 8760 hours.  CBG:  Recent Labs Lab 12/24/15 1114  GLUCAP 134*       Signed:  Jeralyn Bennett MD.  [facility] 12/26/2015, 12:26 PM

## 2015-12-26 NOTE — Progress Notes (Signed)
Pt d/c home acknowledge understanding of instructions. Family at bedside with patient. Prepared to go by wheelchair in stable condition. SRP,RN

## 2015-12-26 NOTE — Progress Notes (Signed)
Patient ID: Connor Vaughn, male   DOB: 1961-04-28, 55 y.o.   MRN: [id number]    Progress Note   Subjective   Talkative, anxious- breathing ok but says he had several "spells " last night with coughing and SOB, feels better sitting up   Objective   Vital signs in last 24 hours: Temp:  [97.7 F (36.5 C)-98.8 F (37.1 C)] 98 F (36.7 C) (08/05 0432) Pulse Rate:  [101-115] 101 (08/05 0432) Resp:  [18-20] 20 (08/05 0432) BP: (113-159)/(41-89) 113/80 (08/05 0432) SpO2:  [93 %-95 %] 93 % (08/05 0432) Last BM Date: 12/23/15 General:    white male  in NAD Heart:  Regular rate and rhythm; no murmurs Lungs: Respirations even and unlabored, decreased BS bilat bases ,right decreased lower half  Abdomen:  Soft, nontender and nondistended. Normal bowel sounds. Extremities:  Without edema. Neurologic:  Alert and oriented,  grossly normal neurologically. Psych:  Cooperative. Normal mood and affect.  Intake/Output from previous day: 08/04 0701 - 08/05 0700 In: -  Out: 750 [Urine:750] Intake/Output this shift: No intake/output data recorded.  Lab Results:  Recent Labs  12/24/15 1055 12/25/15 0521 12/26/15 0515  WBC 11.1* 7.0 6.3  HGB 15.2 13.9 13.5  HCT 44.2 40.2 39.6  PLT 149* 101* 104*   BMET  Recent Labs  12/24/15 1055 12/25/15 0521 12/26/15 0515  NA 134* 136 137  K 3.5 3.1* 3.6  CL 94* 99* 102  CO2 GLUCOSE 153* 108* 116*  BUN CREATININE 0.87 0.86 0.85  CALCIUM 8.9 8.3* 8.2*   LFT  Recent Labs  12/26/15 0515  PROT 5.7*  ALBUMIN 2.4*  AST 56*  ALT 26  ALKPHOS 108  BILITOT 2.2*   PT/INR  Recent Labs  12/24/15 1055  LABPROT 14.8  INR 1.15    Studies/Results: Dg Chest 1 View  Result Date: 12/25/2015 CLINICAL DATA:  Status post right thoracentesis. EXAM: CHEST 1 VIEW COMPARISON:  December 24, 2015 FINDINGS: The right-sided pleural effusion is smaller in the interval but still fills approximately half of the right hemi thorax. No  pneumothorax. The left lung remains clear. No other interval changes. IMPRESSION: No pneumothorax after thoracentesis. Significant effusion remains on the right but it is smaller in the interval. Electronically Signed   By: Gerome Sam III M.D   On: 12/25/2015 16:33   Dg Chest 1 View  Result Date: 12/24/2015 CLINICAL DATA:  Status post right-sided thoracentesis. EXAM: CHEST 1 VIEW COMPARISON:  Chest CT 12/24/2015 FINDINGS: There is a persistent large right pleural effusion. Some central aerated right lung is now visualized. The left lung remains clear. IMPRESSION: Persistent large right pleural effusion. Some central lung aeration is identified now. Electronically Signed   By: Rudie Meyer M.D.   On: 12/24/2015 16:11   Dg Chest 2 View  Result Date: 12/24/2015 CLINICAL DATA:  Syncopal episode for 1 week. EXAM: CHEST  2 VIEW COMPARISON:  None FINDINGS: Normal heart size. The left lung is clear. There is complete opacification of the right hemi thorax. The visualized osseous structures are notable for mild multi level thoracic spondylosis. IMPRESSION: 1. Complete opacification of the right hemi thorax is noted. Assuming no prior surgical history of pneumonectomy, this may be due to a large right pleural effusion, atelectasis of the entire right lung (perhaps secondary to central obstructing mass) or diffuse pneumonia. Further investigation with contrast enhanced CT of the chest is recommended. Electronically Signed   By: Ladona Ridgel  Bradly Chris M.D.   On: 12/24/2015 11:23   Dg Shoulder Right  Result Date: 12/24/2015 CLINICAL DATA:  Syncopal episodes over the last week. Shortness of breath and chest pain. Fell this morning with shoulder pain. EXAM: RIGHT SHOULDER - 2+ VIEW COMPARISON:  None. FINDINGS: There is no evidence of fracture or dislocation. There is no evidence of arthropathy or other focal bone abnormality. Soft tissues are unremarkable. IMPRESSION: Negative. Electronically Signed   By: Paulina Fusi M.D.    On: 12/24/2015 11:26   Dg Forearm Right  Result Date: 12/24/2015 CLINICAL DATA:  Larey Seat this morning.  Mid forearm pain. EXAM: RIGHT FOREARM - 2 VIEW COMPARISON:  None. FINDINGS: Negative for radial or ulnar fracture. IMPRESSION: Negative. Electronically Signed   By: Paulina Fusi M.D.   On: 12/24/2015 11:31   Ct Head Wo Contrast  Result Date: 12/24/2015 CLINICAL DATA:  Fall.  Syncope. EXAM: CT HEAD WITHOUT CONTRAST CT CERVICAL SPINE WITHOUT CONTRAST TECHNIQUE: Multidetector CT imaging of the head and cervical spine was performed following the standard protocol without intravenous contrast. Multiplanar CT image reconstructions of the cervical spine were also generated. COMPARISON:  None. FINDINGS: CT HEAD FINDINGS Ventricle size normal.  Cerebral volume normal. Negative for acute or chronic infarction. Negative for hemorrhage or mass Negative for skull fracture. CT CERVICAL SPINE FINDINGS Normal cervical alignment.  Negative for fracture Mild disc and facet degeneration in the cervical spine. No significant spinal stenosis Large right pleural effusion. Right upper lobe is not aerated due to the effusion. Left upper lobe is clear. IMPRESSION: Negative CT of the head Negative for cervical spine fracture Large right pleural effusion. Electronically Signed   By: Marlan Palau M.D.   On: 12/24/2015 11:53   Ct Chest W Contrast  Result Date: 12/24/2015 CLINICAL DATA:  55 year old male with history of cough, shortness breath and 20 pound weight loss over the past month. EXAM: CT CHEST WITH CONTRAST TECHNIQUE: Multidetector CT imaging of the chest was performed during intravenous contrast administration. CONTRAST:  75mL ISOVUE-300 IOPAMIDOL (ISOVUE-300) INJECTION 61% COMPARISON:  No priors. FINDINGS: Cardiovascular: Heart size is normal. There is no significant pericardial fluid, thickening or pericardial calcification. Calcifications of the aortic valve. Aortic atherosclerosis. Bilateral atria appear compressed.  Mediastinum/Nodes: No pathologically enlarged mediastinal or hilar lymph nodes. Esophagus is unremarkable in appearance. 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 No axillary lymphadenopathy. Lungs/Pleura: Massive right-sided pleural effusion occupying the entire volume of the right hemithorax with associated complete atelectasis of the right lung. This is exerting mass effect slightly deviating the cardiomediastinal structures to into the left hemithorax. Within the collapsed right lung, no discrete pulmonary nodule or mass is confidently identified. Although assessment is slightly limited by patient motion, no obstructing endobronchial lesion is identified. A few patchy areas of ground-glass attenuation are noted in the left upper lobe near the apex. No confluent consolidative airspace disease in the left lung. No left pleural effusion. Upper Abdomen: Visualized portions of the liver demonstrate a shrunken appearance and very nodular contour, compatible with underlying cirrhosis. Moderate volume of perihepatic ascites. Recannulized paraumbilical vein. Numerous portosystemic collateral vessels throughout the upper abdomen, most notable for proximal gastric varices. Spleen is incompletely visualized, but appears at least borderline enlarged. Portal vein is dilated measuring 15 mm in diameter. Musculoskeletal: There are no aggressive appearing lytic or blastic lesions noted in the visualized portions of the skeleton. IMPRESSION: 1. Massive right-sided pleural effusion which appears to potentially be under tension, as demonstrated by deviation of cardiomediastinal structures into the left  hemithorax, and apparent atrial compression. It is unusual for such a large pleural effusion to be present in the absence of underlying malignancy (no definite malignancy confidently identified on today's CT examination). Correlation with thoracentesis is recommended to assess for malignant cells, and to provide symptomatic relief if  clinically appropriate. 2. Patchy areas of ground-glass attenuation in the left upper lobe near the apex. These are nonspecific, and presumably infectious/inflammatory in etiology. 3. Cirrhosis with stigmata of portal hypertension, including distal esophageal and proximal gastric varices, as above. 4. Moderate volume of ascites. 5. Aortic atherosclerosis. 6. There are calcifications of the aortic valve. Echocardiographic correlation for evaluation of potential valvular dysfunction may be warranted if clinically indicated. Electronically Signed   By: Trudie Reed M.D.   On: 12/24/2015 12:10   Ct Cervical Spine Wo Contrast  Result Date: 12/24/2015 CLINICAL DATA:  Fall.  Syncope. EXAM: CT HEAD WITHOUT CONTRAST CT CERVICAL SPINE WITHOUT CONTRAST TECHNIQUE: Multidetector CT imaging of the head and cervical spine was performed following the standard protocol without intravenous contrast. Multiplanar CT image reconstructions of the cervical spine were also generated. COMPARISON:  None. FINDINGS: CT HEAD FINDINGS Ventricle size normal.  Cerebral volume normal. Negative for acute or chronic infarction. Negative for hemorrhage or mass Negative for skull fracture. CT CERVICAL SPINE FINDINGS Normal cervical alignment.  Negative for fracture Mild disc and facet degeneration in the cervical spine. No significant spinal stenosis Large right pleural effusion. Right upper lobe is not aerated due to the effusion. Left upper lobe is clear. IMPRESSION: Negative CT of the head Negative for cervical spine fracture Large right pleural effusion. Electronically Signed   By: Marlan Palau M.D.   On: 12/24/2015 11:53   US Abdomen Limited Ruq  Result Date: 12/26/2015 CLINICAL DATA:  55 year old male with a history of cirrhosis EXAM: US ABDOMEN LIMITED - RIGHT UPPER QUADRANT COMPARISON:  CT chest 12/24/2015 FINDINGS: Gallbladder: Circumferential mild wall thickening of the gallbladder. Negative sonographic Murphy sign. Two adjacent  hyperechoic foci of the near field gallbladder wall. No pericholecystic fluid. No large hyperechoic stones within the gallbladder lumen. Common bile duct: Diameter: 4 Liver: Nodular liver surface with heterogeneous echotexture, compatible with the given history of cirrhosis an appearance on prior CT study. Left liver lobe not visualized secondary to bowel gas, with no right-sided abnormal focus identified. Incidental imaging of right pleural fluid. Small abdominal ascites. IMPRESSION: Sonographic survey demonstrating changes of cirrhosis, compatible with given history and the appearance of the liver on comparison CT. Note that the left liver lobe not visualized, and this study does not service a complete ultrasound survey for liver mass. Circumferential gallbladder wall thickening with isolated hyperechoic foci, likely a combination of adenomyomatosis and chronic wall thickening secondary to the adjacent liver disease. Incidental imaging of right pleural fluid and ascites. Signed, Yvone Neu. Loreta Ave, DO [facility] Electronically Signed   By: Gilmer Mor D.O.   On: 12/26/2015 09:31   US Thoracentesis Asp Pleural Space W/img Guide  Result Date: 12/25/2015 INDICATION: Cirrhosis with right pleural effusion. A 2 L thoracentesis was performed yesterday. Request made for repeat thoracentesis today. EXAM: ULTRASOUND GUIDED THERAPEUTIC THORACENTESIS MEDICATIONS: 1% lidocaine COMPLICATIONS: None immediate. PROCEDURE: An ultrasound guided thoracentesis was thoroughly discussed with the patient and questions answered. The benefits, risks, alternatives and complications were also discussed. The patient understands and wishes to proceed with the procedure. Written consent was obtained. Ultrasound was performed to localize and mark an adequate pocket of fluid in the  right chest. The area was then prepped and draped in the normal sterile fashion. 1% Lidocaine was used  for local anesthesia. Under ultrasound guidance a Safe-T-Centesis catheter was introduced. Thoracentesis was performed. The catheter was removed and a dressing applied. FINDINGS: A total of approximately 2 L of yellow fluid was removed. IMPRESSION: Successful ultrasound guided right thoracentesis yielding 2 L of pleural fluid. We terminated the procedure after 2 L was obtained due to a maximum limit. Read by: Barnetta Chapel, PA-C Electronically Signed   By: Richarda Overlie M.D.   On: 12/25/2015 16:25   US Thoracentesis Asp Pleural Space W/img Guide  Result Date: 12/24/2015 INDICATION: Smoker with history of cough, dyspnea, weight loss, cirrhosis by imaging, ascites, large right pleural effusion. Request made for diagnostic and therapeutic right thoracentesis. EXAM: ULTRASOUND GUIDED DIAGNOSTIC AND THERAPEUTIC RIGHT THORACENTESIS MEDICATIONS: None. COMPLICATIONS: None immediate. PROCEDURE: An ultrasound guided thoracentesis was thoroughly discussed with the patient and questions answered. The benefits, risks, alternatives and complications were also discussed. The patient understands and wishes to proceed with the procedure. Written consent was obtained. Ultrasound was performed to localize and mark an adequate pocket of fluid in the right chest. The area was then prepped and draped in the normal sterile fashion. 1% Lidocaine was used for local anesthesia. Under ultrasound guidance a Safe-T-Centesis catheter was introduced. Thoracentesis was performed. The catheter was removed and a dressing applied. FINDINGS: A total of approximately 2 liters of yellow fluid was removed. Samples were sent to the laboratory as requested by the clinical team. Due to this being patient's first thoracentesis only the above amount of fluid was removed at this time. IMPRESSION: Successful ultrasound guided diagnostic and therapeutic right thoracentesis yielding 2 liters of pleural fluid. Read by: Jeananne Rama, PA-C Electronically Signed    By: Malachy Moan M.D.   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

## 2015-12-26 NOTE — Discharge Instructions (Signed)
Ascites °Ascites is a collection of excess fluid in the abdomen. Ascites can range from mild to severe. It can get worse without treatment. °CAUSES °Possible causes include: °· Cirrhosis. This is the most common cause of ascites. °· Infection or inflammation in the abdomen. °· Cancer in the abdomen. °· Heart failure. °· Kidney disease. °· Inflammation of the pancreas. °· Clots in the veins of the liver. °SIGNS AND SYMPTOMS °Signs and symptoms may include: °· A feeling of fullness in your abdomen. This is common. °· An increase in the size of your abdomen or your waist. °· Swelling in your legs. °· Swelling of the scrotum in men. °· Difficulty breathing. °· Abdominal pain. °· Sudden weight gain. °If the condition is mild, you may not have symptoms. °DIAGNOSIS °To make a diagnosis, your health care provider will: °· Ask about your medical history. °· Perform a physical exam. °· Order imaging tests, such as an ultrasound or CT scan of your abdomen. °TREATMENT °Treatment depends on the cause of the ascites. It may include: °· Taking a pill to make you urinate. This is called a water pill (diuretic pill). °· Strictly reducing your salt (sodium) intake. Salt can cause extra fluid to be kept in the body, and this makes ascites worse. °· Having a procedure to remove fluid from your abdomen (paracentesis). °· Having a procedure to transfer fluid from your abdomen into a vein. °· Having a procedure that connects two of the major veins within your liver and relieves pressure on your liver (TIPS procedure). °Ascites may go away or improve with treatment of the condition that caused it.  °HOME CARE INSTRUCTIONS °· Keep track of your weight. To do this, weigh yourself at the same time every day and record your weight. °· Keep track of how much you drink and any changes in the amount you urinate. °· Follow any instructions that your health care provider gives you about how much to drink. °· Try not to eat salty (high-sodium)  foods. °· Take medicines only as directed by your health care provider. °· Keep all follow-up visits as directed by your health care provider. This is important. °· Report any changes in your health to your health care provider, especially if you develop new symptoms or your symptoms get worse. °SEEK MEDICAL CARE IF: °· Your gain more than 3 pounds in 3 days. °· Your abdominal size or your waist size increases. °· You have new swelling in your legs. °· The swelling in your legs gets worse. °SEEK IMMEDIATE MEDICAL CARE IF: °· You develop a fever. °· You develop confusion. °· You develop new or worsening difficulty breathing. °· You develop new or worsening abdominal pain. °· You develop new or worsening swelling in the scrotum (in men). °  °This information is not intended to replace advice given to you by your health care provider. Make sure you discuss any questions you have with your health care provider. °  °Document Released: 05/09/2005 Document Revised: 05/30/2014 Document Reviewed: 12/06/2013 °[employer] Interactive Patient Education ©2016 [employer] ° °

## 2015-12-28 LAB — HCV RNA QUANT
HCV Quantitative Log: 6.553 log10 IU/mL (ref >1.70)
HCV Quantitative: 3570000 IU/mL (ref >50)

## 2015-12-28 LAB — HEPATITIS C GENOTYPE

## 2015-12-28 LAB — PH, BODY FLUID: pH, Body Fluid: 8

## 2015-12-29 LAB — CULTURE, BODY FLUID W GRAM STAIN -BOTTLE: Culture: NO GROWTH

## 2016-01-10 ENCOUNTER — Emergency Department (HOSPITAL_COMMUNITY): Payer: BLUE CROSS/BLUE SHIELD

## 2016-01-10 ENCOUNTER — Observation Stay (HOSPITAL_COMMUNITY)
Admission: EM | Admit: 2016-01-10 | Discharge: 2016-01-11 | DRG: 187 | Disposition: A | Discharge status: Home / Self Care | Payer: BLUE CROSS/BLUE SHIELD | Attending: Internal Medicine | Admitting: Internal Medicine

## 2016-01-10 ENCOUNTER — Encounter (HOSPITAL_COMMUNITY): Payer: Self-pay | Admitting: Emergency Medicine

## 2016-01-10 DIAGNOSIS — Z87891 Personal history of nicotine dependence: Secondary | ICD-10-CM | POA: Diagnosis not present

## 2016-01-10 DIAGNOSIS — Z7982 Long term (current) use of aspirin: Secondary | ICD-10-CM

## 2016-01-10 DIAGNOSIS — K7030 Alcoholic cirrhosis of liver without ascites: Secondary | ICD-10-CM | POA: Diagnosis not present

## 2016-01-10 DIAGNOSIS — Z79899 Other long term (current) drug therapy: Secondary | ICD-10-CM | POA: Diagnosis not present

## 2016-01-10 DIAGNOSIS — Z91040 Latex allergy status: Secondary | ICD-10-CM

## 2016-01-10 DIAGNOSIS — J90 Pleural effusion, not elsewhere classified: Secondary | ICD-10-CM | POA: Diagnosis not present

## 2016-01-10 DIAGNOSIS — R0602 Shortness of breath: Secondary | ICD-10-CM

## 2016-01-10 DIAGNOSIS — J948 Other specified pleural conditions: Secondary | ICD-10-CM | POA: Diagnosis not present

## 2016-01-10 DIAGNOSIS — K7031 Alcoholic cirrhosis of liver with ascites: Secondary | ICD-10-CM

## 2016-01-10 DIAGNOSIS — F419 Anxiety disorder, unspecified: Secondary | ICD-10-CM | POA: Diagnosis present

## 2016-01-10 DIAGNOSIS — Z9889 Other specified postprocedural states: Secondary | ICD-10-CM

## 2016-01-10 HISTORY — DX: Unspecified cirrhosis of liver: K74.60

## 2016-01-10 LAB — COMPREHENSIVE METABOLIC PANEL
ALT: 41 U/L (ref 17–63)
AST: 84 U/L — ABNORMAL HIGH (ref 15–41)
Albumin: 3.2 g/dL — ABNORMAL LOW (ref 3.5–5.0)
Alkaline Phosphatase: 109 U/L (ref 38–126)
Anion gap: 7 (ref 5–15)
BUN: 12 mg/dL (ref 6–20)
CO2: 26 mmol/L (ref 22–32)
Calcium: 9.1 mg/dL (ref 8.9–10.3)
Chloride: 102 mmol/L (ref 101–111)
Creatinine, Ser: 1.02 mg/dL (ref 0.61–1.24)
GFR calc Af Amer: >60 mL/min (ref >60)
GFR calc non Af Amer: >60 mL/min (ref >60)
Glucose, Bld: 204 mg/dL — ABNORMAL HIGH (ref 65–99)
Potassium: 4.1 mmol/L (ref 3.5–5.1)
Sodium: 135 mmol/L (ref 135–145)
Total Bilirubin: 1.9 mg/dL — ABNORMAL HIGH (ref 0.3–1.2)
Total Protein: 7.4 g/dL (ref 6.5–8.1)

## 2016-01-10 LAB — CBC WITH DIFFERENTIAL/PLATELET
Basophils Absolute: 0.1 10*3/uL (ref 0.0–0.1)
Basophils Relative: 2 %
Eosinophils Absolute: 0.6 10*3/uL (ref 0.0–0.7)
Eosinophils Relative: 7 %
HCT: 45.4 % (ref 39.0–52.0)
Hemoglobin: 15.9 g/dL (ref 13.0–17.0)
Lymphocytes Relative: 21 %
Lymphs Abs: 2 10*3/uL (ref 0.7–4.0)
MCH: 33 pg (ref 26.0–34.0)
MCHC: 35 g/dL (ref 30.0–36.0)
MCV: 94.2 fL (ref 78.0–100.0)
Monocytes Absolute: 0.9 10*3/uL (ref 0.1–1.0)
Monocytes Relative: 10 %
Neutro Abs: 5.6 10*3/uL (ref 1.7–7.7)
Neutrophils Relative %: 60 %
Platelets: 142 10*3/uL — ABNORMAL LOW (ref 150–400)
RBC: 4.82 MIL/uL (ref 4.22–5.81)
RDW: 13.6 % (ref 11.5–15.5)
WBC: 9.2 10*3/uL (ref 4.0–10.5)

## 2016-01-10 LAB — I-STAT TROPONIN, ED: Troponin i, poc: 0 ng/mL (ref 0.00–0.08)

## 2016-01-10 MED ORDER — SODIUM CHLORIDE 0.9% FLUSH: 3.0000 mL | INTRAVENOUS | Status: DC | PRN

## 2016-01-10 MED ORDER — ONDANSETRON HCL 4 MG/2ML IJ SOLN
4.0000 mg | Freq: Once | INTRAMUSCULAR | Status: AC | PRN
  Administered 2016-01-10: 4 mg via INTRAVENOUS
  Filled 2016-01-10: qty 2

## 2016-01-10 MED ORDER — FUROSEMIDE 40 MG PO TABS
80.0000 mg | ORAL_TABLET | Freq: Every day | ORAL | Status: DC
Start: 2016-01-10 — End: 2016-01-11
  Administered 2016-01-10 – 2016-01-11 (×2): 80 mg via ORAL
  Filled 2016-01-10 (×2): qty 2

## 2016-01-10 MED ORDER — ONDANSETRON HCL 4 MG PO TABS: 4.0000 mg | ORAL_TABLET | Freq: Four times a day (QID) | ORAL | Status: DC | PRN

## 2016-01-10 MED ORDER — LORAZEPAM 0.5 MG PO TABS
0.5000 mg | ORAL_TABLET | Freq: Two times a day (BID) | ORAL | Status: DC | PRN
  Administered 2016-01-10 – 2016-01-11 (×2): 0.5 mg via ORAL
  Filled 2016-01-10 (×3): qty 1

## 2016-01-10 MED ORDER — SODIUM CHLORIDE 0.9 % IV BOLUS (SEPSIS)
1000.0000 mL | Freq: Once | INTRAVENOUS | Status: AC
  Administered 2016-01-10: 1000 mL via INTRAVENOUS

## 2016-01-10 MED ORDER — ALBUTEROL SULFATE (2.5 MG/3ML) 0.083% IN NEBU
2.5000 mg | INHALATION_SOLUTION | RESPIRATORY_TRACT | Status: DC | PRN
  Administered 2016-01-10 – 2016-01-11 (×2): 2.5 mg via RESPIRATORY_TRACT
  Filled 2016-01-10 (×2): qty 3

## 2016-01-10 MED ORDER — TRAMADOL HCL 50 MG PO TABS
50.0000 mg | ORAL_TABLET | Freq: Four times a day (QID) | ORAL | Status: DC | PRN
  Administered 2016-01-11 (×2): 50 mg via ORAL
  Filled 2016-01-10 (×2): qty 1

## 2016-01-10 MED ORDER — POLYETHYLENE GLYCOL 3350 17 G PO PACK: 17.0000 g | PACK | Freq: Every day | ORAL | Status: DC | PRN

## 2016-01-10 MED ORDER — HEPARIN SODIUM (PORCINE) 5000 UNIT/ML IJ SOLN
5000.0000 [IU] | Freq: Three times a day (TID) | INTRAMUSCULAR | Status: DC
  Filled 2016-01-10: qty 1

## 2016-01-10 MED ORDER — SODIUM CHLORIDE 0.9% FLUSH: 3.0000 mL | Freq: Two times a day (BID) | INTRAVENOUS | Status: DC

## 2016-01-10 MED ORDER — SODIUM CHLORIDE 0.9% FLUSH
3.0000 mL | Freq: Two times a day (BID) | INTRAVENOUS | Status: DC
  Administered 2016-01-11: 3 mL via INTRAVENOUS

## 2016-01-10 MED ORDER — SODIUM CHLORIDE 0.9 % IV SOLN: INTRAVENOUS | Status: AC

## 2016-01-10 MED ORDER — ONDANSETRON HCL 4 MG/2ML IJ SOLN: 4.0000 mg | Freq: Four times a day (QID) | INTRAMUSCULAR | Status: DC | PRN

## 2016-01-10 MED ORDER — SPIRONOLACTONE 25 MG PO TABS
200.0000 mg | ORAL_TABLET | Freq: Every day | ORAL | Status: DC
  Administered 2016-01-10 – 2016-01-11 (×2): 200 mg via ORAL
  Filled 2016-01-10 (×2): qty 8

## 2016-01-10 MED ORDER — ALBUTEROL SULFATE (2.5 MG/3ML) 0.083% IN NEBU
5.0000 mg | INHALATION_SOLUTION | Freq: Once | RESPIRATORY_TRACT | Status: AC
  Administered 2016-01-10: 5 mg via RESPIRATORY_TRACT
  Filled 2016-01-10: qty 6

## 2016-01-10 MED ORDER — SODIUM CHLORIDE 0.9 % IV SOLN: 250.0000 mL | INTRAVENOUS | Status: DC | PRN

## 2016-01-10 NOTE — H&P (Signed)
History and Physical    ASIF MUCHOW HKV:[id number] DOB: [birth year] DOA: 01/10/2016  PCP: Lonia Skinner, MD  Patient coming from: home  Chief Complaint: SOB  HPI: Connor Vaughn is a 55 y.o. male with medical history significant for cirrhosis due to alcohol abuse, he relates his last drink was more than a month ago, recently discharged from the hospital massive right-sided pleural effusion probably hydrothorax, comes in for shortness of breath started about 4 days prior to admission. He relates it has progressively gotten worse to the point where he can even go to the bathroom without being short of breath. He denies any syncopal episodes.  ED Course: He was found to be tachycardic satting 95% on room air, A chest x-ray was done that showed moderate worsening right-sided pleural effusion, with no mediastinal shift.  Review of Systems: As per HPI otherwise 10 point review of systems negative.    Past Medical History:  Diagnosis Date  . Cirrhosis [facility])     Past Surgical History:  Procedure Laterality Date  . thoracocenteis       reports that he has quit smoking. He has never used smokeless tobacco. He reports that he drinks alcohol. He reports that he does not use drugs.  Allergies  Allergen Reactions  . Latex Rash    Family History  Problem Relation Age of Onset  . Heart attack Mother   . Heart attack Father   . Diabetes Mellitus II Father     Prior to Admission medications   Medication Sig Start Date End Date Taking? Authorizing Provider  aspirin-acetaminophen-caffeine (EXCEDRIN MIGRAINE) [phone] MG tablet Take 1 tablet by mouth every 6 (six) hours as needed for headache.   Yes Historical Provider, MD  benzonatate (TESSALON) 100 MG capsule Take 1 capsule (100 mg total) by mouth 3 (three) times daily as needed for cough. 12/26/15  Yes Jeralyn Bennett, MD  furosemide (LASIX) 40 MG tablet Take 1 tablet (40 mg total) by mouth daily. 12/26/15  Yes Jeralyn Bennett,  MD  LORazepam (ATIVAN) 0.5 MG tablet Take 1 tablet (0.5 mg total) by mouth 2 (two) times daily as needed for anxiety. 12/26/15  Yes Jeralyn Bennett, MD  spironolactone (ALDACTONE) 100 MG tablet Take 1 tablet (100 mg total) by mouth daily. 12/26/15  Yes Jeralyn Bennett, MD    Physical Exam: Vitals:   01/10/16 1206 01/10/16 1229 01/10/16 1346  BP: (!) 153/103  133/76  Pulse: (!) 127  (!) 129  Resp: 19  22  Temp: 97.7 F (36.5 C)  97.8 F (36.6 C)  TempSrc: Oral  Oral  SpO2: 96% 96% 95%      Constitutional: NAD, calm, comfortable Vitals:   01/10/16 1206 01/10/16 1229 01/10/16 1346  BP: (!) 153/103  133/76  Pulse: (!) 127  (!) 129  Resp: 19  22  Temp: 97.7 F (36.5 C)  97.8 F (36.6 C)  TempSrc: Oral  Oral  SpO2: 96% 96% 95%   Eyes: PERRL, lids and conjunctivae normal ENMT: Mucous membranes are moist. Posterior pharynx clear of any exudate or lesions.Normal dentition.  Neck: normal, supple, no masses, no thyromegaly Respiratory: Good air movement on the left decreased sounds on the right middle no sounds on the right lower lobe Cardiovascular: Regular rate and rhythm, no murmurs / rubs / gallops. No extremity edema. 2+ pedal pulses. No carotid bruits.  Abdomen: no tenderness, no masses palpated. No hepatosplenomegaly. Bowel sounds positive.  Musculoskeletal: no clubbing / cyanosis. No joint deformity upper and  lower extremities. Good ROM, no contractures. Normal muscle tone.  Skin: no rashes, lesions, ulcers. No induration Neurologic: CN 2-12 grossly intact. Sensation intact, DTR normal. Strength 5/5 in all 4.  Psychiatric: Normal judgment and insight. Alert and oriented x 3. Normal mood.    Labs on Admission: I have personally reviewed following labs and imaging studies  CBC:  Recent Labs Lab 01/10/16 1230  WBC 9.2  NEUTROABS 5.6  HGB 15.9  HCT 45.4  MCV 94.2  PLT 142*   Basic Metabolic Panel:  Recent Labs Lab 01/10/16 1230  NA 135  K 4.1  CL 102  CO2 26    GLUCOSE 204*  BUN 12  CREATININE 1.02  CALCIUM 9.1   GFR: CrCl cannot be calculated (Unknown ideal weight.). Liver Function Tests:  Recent Labs Lab 01/10/16 1230  AST 84*  ALT 41  ALKPHOS 109  BILITOT 1.9*  PROT 7.4  ALBUMIN 3.2*   No results for input(s): LIPASE, AMYLASE in the last 168 hours. No results for input(s): AMMONIA in the last 168 hours. Coagulation Profile: No results for input(s): INR, PROTIME in the last 168 hours. Cardiac Enzymes: No results for input(s): CKTOTAL, CKMB, CKMBINDEX, TROPONINI in the last 168 hours. BNP (last 3 results) No results for input(s): PROBNP in the last 8760 hours. HbA1C: No results for input(s): HGBA1C in the last 72 hours. CBG: No results for input(s): GLUCAP in the last 168 hours. Lipid Profile: No results for input(s): CHOL, HDL, LDLCALC, TRIG, CHOLHDL, LDLDIRECT in the last 72 hours. Thyroid Function Tests: No results for input(s): TSH, T4TOTAL, FREET4, T3FREE, THYROIDAB in the last 72 hours. Anemia Panel: No results for input(s): VITAMINB12, FOLATE, FERRITIN, TIBC, IRON, RETICCTPCT in the last 72 hours. Urine analysis: No results found for: COLORURINE, APPEARANCEUR, LABSPEC, PHURINE, GLUCOSEU, HGBUR, BILIRUBINUR, KETONESUR, PROTEINUR, UROBILINOGEN, NITRITE, LEUKOCYTESUR Sepsis Labs:  @LABRCNTIP (procalcitonin:4,lacticidven:4) )No results found for this or any previous visit (from the past 240 hour(s)).   Radiological Exams on Admission: Dg Chest Port 1 View  Result Date: 01/10/2016 CLINICAL DATA:  Right side pain, shortness of breath for 3 days EXAM: PORTABLE CHEST 1 VIEW COMPARISON:  12/25/2015 FINDINGS: There is moderate size right pleural effusion slight increased from prior exam. Right lower lobe atelectasis or infiltrate. No pulmonary edema. Left lung is clear. IMPRESSION: Moderate size right pleural effusion increased from prior exam with right lower lobe atelectasis or infiltrate. Left lung is clear. Electronically  Signed   By: Natasha MeadLiviu  Pop M.D.   On: 01/10/2016 13:05    EKG: Independently reviewed. none  Assessment/Plan Moderate recurrent  Pleural effusion, right: Last time he was in the hospital it was determined that this was likely due to his cirrhosis, likely hydrothorax, thoracentesis was done through 4 L were removed and it was consistent with transudate. I will go ahead and contacted interventional radiologist for possible thoracocentesis in the morning, will place him nothing by mouth after midnight. We'll increase his Lasix and Aldactone doses. We'll contact Dr. Elnoria HowardHung  as this seems to be recurrent episode to see if  likely benefit from TIPS procedure.  Alcoholic cirrhosis of liver without ascites (HCC): Continue Lasix and spironolactone.  History of abuse: He relates his last drink was more than a month ago, his wife is at bedside which confirms it.  DVT prophylaxis: lovenox Code Status: full Family Communication: wife Disposition Plan: inpatient Consults called: IR and Dr. Elnoria HowardHung Admission status: inpatient, telemetry   Marinda ElkFELIZ ORTIZ, Altair Appenzeller MD [facility] Pager [phone]  If  7PM-7AM, please contact night-coverage [email or web address] Password [email or web address]  01/10/2016, 2:55 PM

## 2016-01-10 NOTE — ED Provider Notes (Signed)
[facility]-EMERGENCY DEPT Provider Note   CSN: [id number] Arrival date & time: 01/10/16  1159     History   Chief Complaint Chief Complaint  Patient presents with  . Shortness of Breath  . right rib cage pain    HPI Connor Vaughn is a 55 y.o. male hx of alcohol cirrhosis, known R hydrothorax here with shortness of breath. 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 He was discharged 2 weeks ago. For the last week, he noticed progressive shortness of breath and has trouble exerting himself. Has nonproductive cough, no fever. He got so short of breath today that he came to the ED. Has follow up with Dr. Elnoria HowardHung next week. He no longer drink alcohol.   The history is provided by the patient.   Level V caveat- condition of patient   History reviewed. No pertinent past medical history.  Patient Active Problem List   Diagnosis Date Noted  . Alcoholic cirrhosis of liver with ascites (HCC)   . Pleural effusion 12/24/2015  . Pleural effusion, right 12/24/2015  . Alcohol abuse 12/24/2015  . Syncope 12/24/2015    History reviewed. No pertinent surgical history.     Home Medications    Prior to Admission medications   Medication Sig Start Date End Date Taking? Authorizing Provider  aspirin-acetaminophen-caffeine (EXCEDRIN MIGRAINE) [phone] MG tablet Take 1 tablet by mouth every 6 (six) hours as needed for headache.   Yes Historical Provider, MD  benzonatate (TESSALON) 100 MG capsule Take 1 capsule (100 mg total) by mouth 3 (three) times daily as needed for cough. 12/26/15  Yes Jeralyn BennettEzequiel Zamora, MD  furosemide (LASIX) 40 MG tablet Take 1 tablet (40 mg total) by mouth daily. 12/26/15  Yes Jeralyn BennettEzequiel Zamora, MD  LORazepam (ATIVAN) 0.5 MG tablet Take 1 tablet (0.5 mg total) by mouth 2 (two) times daily as needed for anxiety. 12/26/15  Yes Jeralyn BennettEzequiel Zamora, MD  spironolactone (ALDACTONE) 100 MG tablet  Take 1 tablet (100 mg total) by mouth daily. 12/26/15  Yes Jeralyn BennettEzequiel Zamora, MD    Family History No family history on file.  Social History Social History  Substance Use Topics  . Smoking status: Former Games developermoker  . Smokeless tobacco: Never Used  . Alcohol use Yes     Comment: every other day - drinks beer 6 pack      Allergies   Latex   Review of Systems Review of Systems  Respiratory: Positive for shortness of breath.   All other systems reviewed and are negative.    Physical Exam Updated Vital Signs BP (!) 153/103 (BP Location: Right Arm)   Pulse (!) 127   Temp 97.7 F (36.5 C) (Oral)   Resp 19   SpO2 96%   Physical Exam  Constitutional: He is oriented to person, place, and time.  Short of breath, tachypneic   HENT:  Head: Normocephalic.  Eyes: EOM are normal. Pupils are equal, round, and reactive to light.  Neck: Normal range of motion. Neck supple.  Cardiovascular: Regular rhythm and normal heart sounds.   Tachycardic   Pulmonary/Chest:  Tachypneic, no breath sounds R lung. No wheezing   Abdominal: Soft. Bowel sounds are normal. He exhibits no distension. There is no tenderness.  Musculoskeletal: Normal range of motion.  Neurological: He is alert and oriented to person, place, and time.  Skin: Skin is warm.  Psychiatric: He has a normal mood and affect.  Nursing note and vitals reviewed.  ED Treatments / Results  Labs (all labs ordered are listed, but only abnormal results are displayed) Labs Reviewed  CBC WITH DIFFERENTIAL/PLATELET - Abnormal; Notable for the following:       Result Value   Platelets 142 (*)    All other components within normal limits  COMPREHENSIVE METABOLIC PANEL  I-STAT TROPOININ, ED    EKG  EKG Interpretation  Date/Time:  Sunday January 10 2016 12:06:00 EDT Ventricular Rate:  130 PR Interval:    QRS Duration: 78 QT Interval:  318 QTC Calculation: 468 R Axis:   -66 Text Interpretation:  Sinus tachycardia Inferior  infarct, old No significant change since last tracing Confirmed by KNAPP  MD-J, JON ([id number]) on 01/10/2016 12:12:41 PM       Radiology No results found.  Procedures Procedures (including critical care time)  Medications Ordered in ED Medications  sodium chloride 0.9 % bolus 1,000 mL (not administered)  albuterol (PROVENTIL) (2.5 MG/3ML) 0.083% nebulizer solution 5 mg (5 mg Nebulization Given 01/10/16 1229)  ondansetron (ZOFRAN) injection 4 mg (4 mg Intravenous Given 01/10/16 1228)     Initial Impression / Assessment and Plan / ED Course  I have reviewed the triage vital signs and the nursing notes.  Pertinent labs & imaging results that were available during my care of the patient were reviewed by me and considered in my medical decision making (see chart for details).  Clinical Course   Connor MontaneScott D Vaughn is a 55 y.o. male  Here with recurrent shortness of breath. Tachycardic, O2 95-96%. Concerned for recurrent hydrothorax. Has no breath sounds on right base. Will get xray and likely get US thoracentesis vs pleural drain.  12:15 pm I called IR doctor, Dr[facility] R[facility] Horse[facility]Yamagada, [facility] The IR PA left for the day. He recommend order US thoracentesis. He will be in contact with hospitalist to coordinate US thoracentesis today or tomorrow.  2:33 PM Labs at baseline. CXR showed R pleural effusion. Hospitalist to admit for thoracentesis vs drain.     Final Clinical Impressions(s) / ED Diagnoses   Final diagnoses:  Pleural effusion    New Prescriptions New Prescriptions   No medications on file     Charlynne Panderavid Hsienta Yao, MD 01/10/16 1435

## 2016-01-10 NOTE — Progress Notes (Signed)
Pt requesting SCD for DVT prophylaxis, instead of Heparin injections. MD notified. Will continue to monitor.

## 2016-01-10 NOTE — ED Triage Notes (Signed)
Pt breathing easier, states he is feeling better.

## 2016-01-10 NOTE — ED Triage Notes (Signed)
Patient c/o right side rib cage pain x 3 days with progression of SOB.  Patient states that he recently had pleural effusion on left side and had 4L drained off. Patient states that pain feels similar.  Patient tachycardic in triage.

## 2016-01-11 ENCOUNTER — Inpatient Hospital Stay (HOSPITAL_COMMUNITY): Payer: BLUE CROSS/BLUE SHIELD

## 2016-01-11 DIAGNOSIS — J90 Pleural effusion, not elsewhere classified: Secondary | ICD-10-CM | POA: Diagnosis not present

## 2016-01-11 LAB — BASIC METABOLIC PANEL
Anion gap: 4 — ABNORMAL LOW (ref 5–15)
BUN: 9 mg/dL (ref 6–20)
CO2: 31 mmol/L (ref 22–32)
Calcium: 8.4 mg/dL — ABNORMAL LOW (ref 8.9–10.3)
Chloride: 102 mmol/L (ref 101–111)
Creatinine, Ser: 0.84 mg/dL (ref 0.61–1.24)
GFR calc Af Amer: >60 mL/min (ref >60)
GFR calc non Af Amer: >60 mL/min (ref >60)
Glucose, Bld: 119 mg/dL — ABNORMAL HIGH (ref 65–99)
Potassium: 3.9 mmol/L (ref 3.5–5.1)
Sodium: 137 mmol/L (ref 135–145)

## 2016-01-11 LAB — CBC
HCT: 36.9 % — ABNORMAL LOW (ref 39.0–52.0)
Hemoglobin: 12.9 g/dL — ABNORMAL LOW (ref 13.0–17.0)
MCH: 33.2 pg (ref 26.0–34.0)
MCHC: 35 g/dL (ref 30.0–36.0)
MCV: 94.9 fL (ref 78.0–100.0)
Platelets: 91 10*3/uL — ABNORMAL LOW (ref 150–400)
RBC: 3.89 MIL/uL — ABNORMAL LOW (ref 4.22–5.81)
RDW: 13.8 % (ref 11.5–15.5)
WBC: 4.8 10*3/uL (ref 4.0–10.5)

## 2016-01-11 MED ORDER — FUROSEMIDE 40 MG PO TABS: 80.0000 mg | ORAL_TABLET | Freq: Every day | ORAL | 0 refills | Status: DC

## 2016-01-11 MED ORDER — SPIRONOLACTONE 100 MG PO TABS: 200.0000 mg | ORAL_TABLET | Freq: Every day | ORAL | 1 refills | Status: DC

## 2016-01-11 NOTE — Progress Notes (Signed)
NUTRITION NOTE  Page received from [address] this AM for request for diet education: low sodium and review of fluid restriction. RN provided handouts for pt concerning these items this AM. RD will see pt 8/22 for the same.    Trenton GammonJessica Arpan Eskelson, MS, RD, LDN Inpatient Clinical Dietitian Pager # [phone] After hours/weekend pager # [phone]

## 2016-01-11 NOTE — Progress Notes (Signed)
Provided educational handouts to patient regarding Fluid restriction and 2gm Sodium diet. No further questions at this time.

## 2016-01-11 NOTE — Procedures (Signed)
Ultrasound-guided  therapeutic right thoracentesis performed yielding 1.9 liters of yellow colored fluid. No immediate complications. Follow-up chest x-ray pending.      Marilene Vath E 9:52 AM 01/11/2016

## 2016-01-12 NOTE — Discharge Summary (Signed)
[facility] Discharge Summary   Patient: Connor Vaughn BMW:[id number]   PCP: Lonia Skinner, MD DOB: 12-Apr-1961   Date of admission: 01/10/2016   Date of discharge: 01/11/2016     Discharge Diagnoses:  Active Problems:   Pleural effusion, right   Alcoholic cirrhosis of liver with ascites (HCC)   Recurrent right pleural effusion   Admitted From: Home Disposition:  Home  Recommendations for Outpatient Follow-up:  1. Follow-up with gastroenterology as scheduled for further workup of liver cirrhosis causing fluid retention. 2. Follow-up with PCP in one week to adjust Lasix and Aldactone with BMP.   Follow-up Information    HUNG,PATRICK D, MD. Schedule an appointment as soon as possible for a visit in 1 week(s).   Specialty:  Gastroenterology Why:  for follow up on cirrhosis Contact information: [address] [place] [address] [phone]        Lonia Skinner, MD. Schedule an appointment as soon as possible for a visit in 1 week(s).   Specialty:  Family Medicine Why:  get BMP check and fluid pills adjusted  Contact information: [address] [place] [address] [phone]          Diet recommendation: Low-salt diet  Activity: The patient is advised to gradually reintroduce usual activities.  Discharge Condition: good  Code Status: full code  History of present illness: As per the H and P dictated on admission, "Connor Vaughn is a 55 y.o. male with medical history significant for cirrhosis due to alcohol abuse, he relates his last drink was more than a month ago, recently discharged from the hospital massive right-sided pleural effusion probably hydrothorax, comes in for shortness of breath started about 4 days prior to admission. He relates it has progressively gotten worse to the point where he can even go to the bathroom without being short of breath. He denies any syncopal episodes"  Hospital Course:  Summary of his  active problems in the hospital is as following.  Active Problems:   Pleural effusion, right   Alcoholic cirrhosis of liver with ascites (HCC)   Recurrent right pleural effusion Patient presents with complaints of recurrent fluid collection with pleural effusion. Status post ultrasound-guided thoracentesis. Patient symptomatically feeling significantly better. Patient was initially recommended to get inpatient GI consult but patient is hemodynamically stable and is scheduled to see GI on Wednesday. Recommend patient to follow-up with gastroenterology as scheduled on Wednesday for further workup regarding his liver cirrhosis. Also will increase patient's Aldactone as well as Lasix dose to double. I recommend patient to get another BMP in 1 week for adjustment of his diuretics.  All other chronic medical condition were stable during the hospitalization.  Patient was ambulatory without any assistance. On the day of the discharge the patient's vitals were stable, and no other acute medical condition were reported by patient. the patient was felt safe to be discharge at home with family.  Procedures and Results:  Ultrasound-guided thoracentesis   Consultations:  none  DISCHARGE MEDICATION: Discharge Medication List as of 01/11/2016  4:32 PM    CONTINUE these medications which have CHANGED   Details  furosemide (LASIX) 40 MG tablet Take 2 tablets (80 mg total) by mouth daily., Starting Mon 01/11/2016, Print    spironolactone (ALDACTONE) 100 MG tablet Take 2 tablets (200 mg total) by mouth daily., Starting Mon 01/11/2016, Print      CONTINUE these medications which have NOT CHANGED   Details  aspirin-acetaminophen-caffeine (EXCEDRIN MIGRAINE) 250-250-65 MG  tablet Take 1 tablet by mouth every 6 (six) hours as needed for headache., Historical Med    benzonatate (TESSALON) 100 MG capsule Take 1 capsule (100 mg total) by mouth 3 (three) times daily as needed for cough., Starting Sat  12/26/2015, Print    LORazepam (ATIVAN) 0.5 MG tablet Take 1 tablet (0.5 mg total) by mouth 2 (two) times daily as needed for anxiety., Starting Sat 12/26/2015, Print       Allergies  Allergen Reactions  . Latex Rash   Discharge Instructions    Diet - low sodium heart healthy    Complete by:  As directed   Increase activity slowly    Complete by:  As directed     Discharge Exam: Filed Weights   01/10/16 1602  Weight: 75.8 kg (167 lb 1.6 oz)   Vitals:   01/11/16 1005 01/11/16 1441  BP: 107/88 119/79  Pulse:  (!) 101  Resp:  19  Temp:  97.9 F (36.6 C)   General: Appear in no distress, no Rash; Oral Mucosa moist. Cardiovascular: S1 and S2 Present, no Murmur, no JVD Respiratory: Bilateral Air entry present and basal Crackles, no wheezes Abdomen: Bowel Sound present, Soft and no tenderness Extremities: no Pedal edema, no calf tenderness Neurology: Grossly no focal neuro deficit.  The results of significant diagnostics from this hospitalization (including imaging, microbiology, ancillary and laboratory) are listed below for reference.    Significant Diagnostic Studies: Dg Chest 1 View  Result Date: 01/11/2016 CLINICAL DATA:  Post right thoracentesis EXAM: CHEST 1 VIEW COMPARISON:  01/10/2016 FINDINGS: Cardiomediastinal silhouette is stable. There is small residual right pleural effusion with right basilar atelectasis or infiltrate. No pneumothorax. IMPRESSION: Small residual right pleural effusion with right basilar atelectasis or infiltrate. No pneumothorax. Electronically Signed   By: Natasha Mead M.D.   On: 01/11/2016 10:12   Dg Chest 1 View  Result Date: 12/25/2015 CLINICAL DATA:  Status post right thoracentesis. EXAM: CHEST 1 VIEW COMPARISON:  December 24, 2015 FINDINGS: The right-sided pleural effusion is smaller in the interval but still fills approximately half of the right hemi thorax. No pneumothorax. The left lung remains clear. No other interval changes. IMPRESSION: No  pneumothorax after thoracentesis. Significant effusion remains on the right but it is smaller in the interval. Electronically Signed   By: Gerome Sam III M.D   On: 12/25/2015 16:33   Dg Chest 1 View  Result Date: 12/24/2015 CLINICAL DATA:  Status post right-sided thoracentesis. EXAM: CHEST 1 VIEW COMPARISON:  Chest CT 12/24/2015 FINDINGS: There is a persistent large right pleural effusion. Some central aerated right lung is now visualized. The left lung remains clear. IMPRESSION: Persistent large right pleural effusion. Some central lung aeration is identified now. Electronically Signed   By: Rudie Meyer M.D.   On: 12/24/2015 16:11   Dg Chest 2 View  Result Date: 12/24/2015 CLINICAL DATA:  Syncopal episode for 1 week. EXAM: CHEST  2 VIEW COMPARISON:  None FINDINGS: Normal heart size. The left lung is clear. There is complete opacification of the right hemi thorax. The visualized osseous structures are notable for mild multi level thoracic spondylosis. IMPRESSION: 1. Complete opacification of the right hemi thorax is noted. Assuming no prior surgical history of pneumonectomy, this may be due to a large right pleural effusion, atelectasis of the entire right lung (perhaps secondary to central obstructing mass) or diffuse pneumonia. Further investigation with contrast enhanced CT of the chest is recommended. Electronically Signed   By: Ladona Ridgel  Bradly Chris M.D.   On: 12/24/2015 11:23   Dg Shoulder Right  Result Date: 12/24/2015 CLINICAL DATA:  Syncopal episodes over the last week. Shortness of breath and chest pain. Fell this morning with shoulder pain. EXAM: RIGHT SHOULDER - 2+ VIEW COMPARISON:  None. FINDINGS: There is no evidence of fracture or dislocation. There is no evidence of arthropathy or other focal bone abnormality. Soft tissues are unremarkable. IMPRESSION: Negative. Electronically Signed   By: Paulina Fusi M.D.   On: 12/24/2015 11:26   Dg Forearm Right  Result Date: 12/24/2015 CLINICAL DATA:   Larey Seat this morning.  Mid forearm pain. EXAM: RIGHT FOREARM - 2 VIEW COMPARISON:  None. FINDINGS: Negative for radial or ulnar fracture. IMPRESSION: Negative. Electronically Signed   By: Paulina Fusi M.D.   On: 12/24/2015 11:31   Ct Head Wo Contrast  Result Date: 12/24/2015 CLINICAL DATA:  Fall.  Syncope. EXAM: CT HEAD WITHOUT CONTRAST CT CERVICAL SPINE WITHOUT CONTRAST TECHNIQUE: Multidetector CT imaging of the head and cervical spine was performed following the standard protocol without intravenous contrast. Multiplanar CT image reconstructions of the cervical spine were also generated. COMPARISON:  None. FINDINGS: CT HEAD FINDINGS Ventricle size normal.  Cerebral volume normal. Negative for acute or chronic infarction. Negative for hemorrhage or mass Negative for skull fracture. CT CERVICAL SPINE FINDINGS Normal cervical alignment.  Negative for fracture Mild disc and facet degeneration in the cervical spine. No significant spinal stenosis Large right pleural effusion. Right upper lobe is not aerated due to the effusion. Left upper lobe is clear. IMPRESSION: Negative CT of the head Negative for cervical spine fracture Large right pleural effusion. Electronically Signed   By: Marlan Palau M.D.   On: 12/24/2015 11:53   Ct Chest W Contrast  Result Date: 12/24/2015 CLINICAL DATA:  55 year old male with history of cough, shortness breath and 20 pound weight loss over the past month. EXAM: CT CHEST WITH CONTRAST TECHNIQUE: Multidetector CT imaging of the chest was performed during intravenous contrast administration. CONTRAST:  75mL ISOVUE-300 IOPAMIDOL (ISOVUE-300) INJECTION 61% COMPARISON:  No priors. FINDINGS: Cardiovascular: Heart size is normal. There is no significant pericardial fluid, thickening or pericardial calcification. Calcifications of the aortic valve. Aortic atherosclerosis. Bilateral atria appear compressed. Mediastinum/Nodes: No pathologically enlarged mediastinal or hilar lymph nodes.  Esophagus is unremarkable in appearance. 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 No axillary lymphadenopathy. Lungs/Pleura: Massive right-sided pleural effusion occupying the entire volume of the right hemithorax with associated complete atelectasis of the right lung. This is exerting mass effect slightly deviating the cardiomediastinal structures to into the left hemithorax. Within the collapsed right lung, no discrete pulmonary nodule or mass is confidently identified. Although assessment is slightly limited by patient motion, no obstructing endobronchial lesion is identified. A few patchy areas of ground-glass attenuation are noted in the left upper lobe near the apex. No confluent consolidative airspace disease in the left lung. No left pleural effusion. Upper Abdomen: Visualized portions of the liver demonstrate a shrunken appearance and very nodular contour, compatible with underlying cirrhosis. Moderate volume of perihepatic ascites. Recannulized paraumbilical vein. Numerous portosystemic collateral vessels throughout the upper abdomen, most notable for proximal gastric varices. Spleen is incompletely visualized, but appears at least borderline enlarged. Portal vein is dilated measuring 15 mm in diameter. Musculoskeletal: There are no aggressive appearing lytic or blastic lesions noted in the visualized portions of the skeleton. IMPRESSION: 1. Massive right-sided pleural effusion which appears to potentially be under tension, as demonstrated by deviation of cardiomediastinal structures into the left  hemithorax, and apparent atrial compression. It is unusual for such a large pleural effusion to be present in the absence of underlying malignancy (no definite malignancy confidently identified on today's CT examination). Correlation with thoracentesis is recommended to assess for malignant cells, and to provide symptomatic relief if clinically appropriate. 2. Patchy areas of ground-glass attenuation in the left  upper lobe near the apex. These are nonspecific, and presumably infectious/inflammatory in etiology. 3. Cirrhosis with stigmata of portal hypertension, including distal esophageal and proximal gastric varices, as above. 4. Moderate volume of ascites. 5. Aortic atherosclerosis. 6. There are calcifications of the aortic valve. Echocardiographic correlation for evaluation of potential valvular dysfunction may be warranted if clinically indicated. Electronically Signed   By: Trudie Reedaniel  Entrikin M.D.   On: 12/24/2015 12:10   Ct Cervical Spine Wo Contrast  Result Date: 12/24/2015 CLINICAL DATA:  Fall.  Syncope. EXAM: CT HEAD WITHOUT CONTRAST CT CERVICAL SPINE WITHOUT CONTRAST TECHNIQUE: Multidetector CT imaging of the head and cervical spine was performed following the standard protocol without intravenous contrast. Multiplanar CT image reconstructions of the cervical spine were also generated. COMPARISON:  None. FINDINGS: CT HEAD FINDINGS Ventricle size normal.  Cerebral volume normal. Negative for acute or chronic infarction. Negative for hemorrhage or mass Negative for skull fracture. CT CERVICAL SPINE FINDINGS Normal cervical alignment.  Negative for fracture Mild disc and facet degeneration in the cervical spine. No significant spinal stenosis Large right pleural effusion. Right upper lobe is not aerated due to the effusion. Left upper lobe is clear. IMPRESSION: Negative CT of the head Negative for cervical spine fracture Large right pleural effusion. Electronically Signed   By: Marlan Palauharles  Clark M.D.   On: 12/24/2015 11:53   Dg Chest Port 1 View  Result Date: 01/10/2016 CLINICAL DATA:  Right side pain, shortness of breath for 3 days EXAM: PORTABLE CHEST 1 VIEW COMPARISON:  12/25/2015 FINDINGS: There is moderate size right pleural effusion slight increased from prior exam. Right lower lobe atelectasis or infiltrate. No pulmonary edema. Left lung is clear. IMPRESSION: Moderate size right pleural effusion increased  from prior exam with right lower lobe atelectasis or infiltrate. Left lung is clear. Electronically Signed   By: Natasha MeadLiviu  Pop M.D.   On: 01/10/2016 13:05   [place] Abdomen Limited Ruq  Result Date: 12/26/2015 CLINICAL DATA:  55 year old male with a history of cirrhosis EXAM: US ABDOMEN LIMITED - RIGHT UPPER QUADRANT COMPARISON:  CT chest 12/24/2015 FINDINGS: Gallbladder: Circumferential mild wall thickening of the gallbladder. Negative sonographic Murphy sign. Two adjacent hyperechoic foci of the near field gallbladder wall. No pericholecystic fluid. No large hyperechoic stones within the gallbladder lumen. Common bile duct: Diameter: 4 Liver: Nodular liver surface with heterogeneous echotexture, compatible with the given history of cirrhosis an appearance on prior CT study. Left liver lobe not visualized secondary to bowel gas, with no right-sided abnormal focus identified. Incidental imaging of right pleural fluid. Small abdominal ascites. IMPRESSION: Sonographic survey demonstrating changes of cirrhosis, compatible with given history and the appearance of the liver on comparison CT. Note that the left liver lobe not visualized, and this study does not service a complete ultrasound survey for liver mass. Circumferential gallbladder wall thickening with isolated hyperechoic foci, likely a combination of adenomyomatosis and chronic wall thickening secondary to the adjacent liver disease. Incidental imaging of right pleural fluid and ascites. Signed, Yvone NeuJaime S. Loreta AveWagner, DO [facility] Electronically Signed   By: Gilmer MorJaime  Wagner D.O.   On: 12/26/2015 09:31  US Thoracentesis Asp Pleural Space W/img Guide  Result Date: 01/11/2016 INDICATION: History of cirrhosis with recurrent right-sided pleural effusion. EXAM: ULTRASOUND GUIDED THERAPEUTIC THORACENTESIS MEDICATIONS: 1% lidocaine. COMPLICATIONS: None immediate. PROCEDURE: An ultrasound guided thoracentesis was  thoroughly discussed with the patient and questions answered. The benefits, risks, alternatives and complications were also discussed. The patient understands and wishes to proceed with the procedure. Written consent was obtained. Ultrasound was performed to localize and mark an adequate pocket of fluid in the right chest. The area was then prepped and draped in the normal sterile fashion. 1% Lidocaine was used for local anesthesia. Under ultrasound guidance a Safe-T-Centesis catheter was introduced. Thoracentesis was performed. The catheter was removed and a dressing applied. FINDINGS: A total of approximately 1.9 L of yellow fluid was removed. IMPRESSION: Successful ultrasound guided right thoracentesis yielding 1.9 L of pleural fluid. Read by: Barnetta Chapel, PA-C Electronically Signed   By: Jolaine Click M.D.   On: 01/11/2016 10:28   US Thoracentesis Asp Pleural Space W/img Guide  Result Date: 12/25/2015 INDICATION: Cirrhosis with right pleural effusion. A 2 L thoracentesis was performed yesterday. Request made for repeat thoracentesis today. EXAM: ULTRASOUND GUIDED THERAPEUTIC THORACENTESIS MEDICATIONS: 1% lidocaine COMPLICATIONS: None immediate. PROCEDURE: An ultrasound guided thoracentesis was thoroughly discussed with the patient and questions answered. The benefits, risks, alternatives and complications were also discussed. The patient understands and wishes to proceed with the procedure. Written consent was obtained. Ultrasound was performed to localize and mark an adequate pocket of fluid in the right chest. The area was then prepped and draped in the normal sterile fashion. 1% Lidocaine was used for local anesthesia. Under ultrasound guidance a Safe-T-Centesis catheter was introduced. Thoracentesis was performed. The catheter was removed and a dressing applied. FINDINGS: A total of approximately 2 L of yellow fluid was removed. IMPRESSION: Successful ultrasound guided right thoracentesis yielding 2 L of  pleural fluid. We terminated the procedure after 2 L was obtained due to a maximum limit. Read by: Barnetta Chapel, PA-C Electronically Signed   By: Richarda Overlie M.D.   On: 12/25/2015 16:25   US Thoracentesis Asp Pleural Space W/img Guide  Result Date: 12/24/2015 INDICATION: Smoker with history of cough, dyspnea, weight loss, cirrhosis by imaging, ascites, large right pleural effusion. Request made for diagnostic and therapeutic right thoracentesis. EXAM: ULTRASOUND GUIDED DIAGNOSTIC AND THERAPEUTIC RIGHT THORACENTESIS MEDICATIONS: None. COMPLICATIONS: None immediate. PROCEDURE: An ultrasound guided thoracentesis was thoroughly discussed with the patient and questions answered. The benefits, risks, alternatives and complications were also discussed. The patient understands and wishes to proceed with the procedure. Written consent was obtained. Ultrasound was performed to localize and mark an adequate pocket of fluid in the right chest. The area was then prepped and draped in the normal sterile fashion. 1% Lidocaine was used for local anesthesia. Under ultrasound guidance a Safe-T-Centesis catheter was introduced. Thoracentesis was performed. The catheter was removed and a dressing applied. FINDINGS: A total of approximately 2 liters of yellow fluid was removed. Samples were sent to the laboratory as requested by the clinical team. Due to this being patient's first thoracentesis only the above amount of fluid was removed at this time. IMPRESSION: Successful ultrasound guided diagnostic and therapeutic right thoracentesis yielding 2 liters of pleural fluid. Read by: Jeananne Rama, PA-C Electronically Signed   By: Malachy Moan M.D.   On: 12/24/2015 16:10    Microbiology: No results found for this or any previous visit (from the past 240 hour(s)).   Labs: CBC:  Recent  Labs Lab 01/10/16 1230 01/11/16 0533  WBC 9.2 4.8  NEUTROABS 5.6  --   HGB 15.9 12.9*  HCT 45.4 36.9*  MCV 94.2 94.9  PLT 142* 91*    Basic Metabolic Panel:  Recent Labs Lab 01/10/16 1230 01/11/16 0533  NA 135 137  K 4.1 3.9  CL 102 102  CO2 26 31  GLUCOSE 204* 119*  BUN 12 9  CREATININE 1.02 0.84  CALCIUM 9.1 8.4*   Liver Function Tests:  Recent Labs Lab 01/10/16 1230  AST 84*  ALT 41  ALKPHOS 109  BILITOT 1.9*  PROT 7.4  ALBUMIN 3.2*   No results for input(s): LIPASE, AMYLASE in the last 168 hours. No results for input(s): AMMONIA in the last 168 hours. Cardiac Enzymes: No results for input(s): CKTOTAL, CKMB, CKMBINDEX, TROPONINI in the last 168 hours. BNP (last 3 results)  Recent Labs  12/25/15 0521  BNP 62.2   CBG: No results for input(s): GLUCAP in the last 168 hours. Time spent: 30 minutes  Signed:  Lynden OxfordEL, Yekaterina Escutia  [facility] 01/11/2016 , 6:43 PM

## 2017-06-26 ENCOUNTER — Emergency Department (HOSPITAL_COMMUNITY): Payer: BLUE CROSS/BLUE SHIELD

## 2017-06-26 ENCOUNTER — Emergency Department (HOSPITAL_COMMUNITY)
Admission: EM | Admit: 2017-06-26 | Discharge: 2017-06-26 | Disposition: A | Discharge status: Home / Self Care | Payer: BLUE CROSS/BLUE SHIELD | Attending: Emergency Medicine | Admitting: Emergency Medicine

## 2017-06-26 ENCOUNTER — Encounter (HOSPITAL_COMMUNITY): Payer: Self-pay | Admitting: Emergency Medicine

## 2017-06-26 DIAGNOSIS — Z91040 Latex allergy status: Secondary | ICD-10-CM | POA: Insufficient documentation

## 2017-06-26 DIAGNOSIS — Z79899 Other long term (current) drug therapy: Secondary | ICD-10-CM | POA: Insufficient documentation

## 2017-06-26 DIAGNOSIS — Z87891 Personal history of nicotine dependence: Secondary | ICD-10-CM | POA: Insufficient documentation

## 2017-06-26 DIAGNOSIS — R188 Other ascites: Secondary | ICD-10-CM

## 2017-06-26 DIAGNOSIS — K7460 Unspecified cirrhosis of liver: Secondary | ICD-10-CM | POA: Insufficient documentation

## 2017-06-26 DIAGNOSIS — R55 Syncope and collapse: Secondary | ICD-10-CM | POA: Diagnosis present

## 2017-06-26 LAB — BASIC METABOLIC PANEL
Anion gap: 9 (ref 5–15)
BUN: 22 mg/dL — ABNORMAL HIGH (ref 6–20)
CO2: 25 mmol/L (ref 22–32)
Calcium: 9.5 mg/dL (ref 8.9–10.3)
Chloride: 104 mmol/L (ref 101–111)
Creatinine, Ser: 1.22 mg/dL (ref 0.61–1.24)
GFR calc Af Amer: >60 mL/min (ref >60)
GFR calc non Af Amer: >60 mL/min (ref >60)
Glucose, Bld: 118 mg/dL — ABNORMAL HIGH (ref 65–99)
Potassium: 3.9 mmol/L (ref 3.5–5.1)
Sodium: 138 mmol/L (ref 135–145)

## 2017-06-26 LAB — CBC WITH DIFFERENTIAL/PLATELET
Basophils Absolute: 0 10*3/uL (ref 0.0–0.1)
Basophils Relative: 1 %
Eosinophils Absolute: 0.2 10*3/uL (ref 0.0–0.7)
Eosinophils Relative: 3 %
HCT: 40.4 % (ref 39.0–52.0)
Hemoglobin: 14.2 g/dL (ref 13.0–17.0)
Lymphocytes Relative: 19 %
Lymphs Abs: 1 10*3/uL (ref 0.7–4.0)
MCH: 30.7 pg (ref 26.0–34.0)
MCHC: 35.1 g/dL (ref 30.0–36.0)
MCV: 87.3 fL (ref 78.0–100.0)
Monocytes Absolute: 0.5 10*3/uL (ref 0.1–1.0)
Monocytes Relative: 9 %
Neutro Abs: 3.7 10*3/uL (ref 1.7–7.7)
Neutrophils Relative %: 68 %
Platelets: 90 10*3/uL — ABNORMAL LOW (ref 150–400)
RBC: 4.63 MIL/uL (ref 4.22–5.81)
RDW: 14.8 % (ref 11.5–15.5)
WBC: 5.4 10*3/uL (ref 4.0–10.5)

## 2017-06-26 LAB — TROPONIN I: Troponin I: <0.03 ng/mL (ref <0.03)

## 2017-06-26 LAB — CBG MONITORING, ED: Glucose-Capillary: 125 mg/dL — ABNORMAL HIGH (ref 65–99)

## 2017-06-26 MED ORDER — SODIUM CHLORIDE 0.9 % IV SOLN: INTRAVENOUS | Status: DC

## 2017-06-26 MED ORDER — DEXTROSE-NACL 5-0.45 % IV SOLN: INTRAVENOUS | Status: DC

## 2017-06-26 MED ORDER — SODIUM CHLORIDE 0.9 % IV BOLUS (SEPSIS): 1000.0000 mL | Freq: Once | INTRAVENOUS | Status: DC

## 2017-06-26 NOTE — ED Provider Notes (Signed)
Patient placed in Quick Look pathway, seen and evaluated   Chief Complaint: dzziness,syncope  HPI:   57 year old male presents after having a syncopal episode while at work.  Patient bends over a lot at work and when he went to stand up he felt lightheaded and dizzy.  He can feel that he was going to pass out.  Has had this happened before in the past.  No loss of bowel or bladder function.  Went to urgent care was sent here for further management.  ROS: no black or bloody stools  Physical Exam:   Gen: No distress  Neuro: Awake and Alert  Skin: Warm    Focused Exam: Lungs clear and heart regular   Initiation of care has begun. The patient has been counseled on the process, plan, and necessity for staying for the completion/evaluation, and the remainder of the medical screening examination    Lorre NickAllen, Augustine Brannick, MD 06/26/17 1256

## 2017-06-26 NOTE — ED Triage Notes (Signed)
Patient was at work bent over and then had syncopal episode. reports that his boss took hi to Urgent care who referred him here. Patient adds that he has left leg numbness at times that has been going on for a while.

## 2017-06-26 NOTE — ED Provider Notes (Signed)
[facility]-EMERGENCY DEPT Provider Note   CSN: [id number] Arrival date & time: 06/26/17  1159     History   Chief Complaint Chief Complaint  Patient presents with  . Loss of Consciousness    HPI Connor Vaughn is a 57 y.o. male.  HPI Patient presents with syncopal episode.  Has been at work after a while off.  History of cirrhosis and ascites.  States he is put on some weight.  States he has difficulty walking long distances and had to go further today than usual.  Had a fall.  No chest pain.  Some trouble breathing with exertion but no chest pain at rest.  Has some swelling in both his legs.  No fevers or chills.  No bruising.  Did not hit his head. Past Medical History:  Diagnosis Date  . Cirrhosis [facility](HCC)     Patient Active Problem List   Diagnosis Date Noted  . Recurrent right pleural effusion 01/10/2016  . Alcoholic cirrhosis of liver with ascites (HCC)   . Pleural effusion 12/24/2015  . Pleural effusion, right 12/24/2015  . Alcohol abuse 12/24/2015  . Syncope 12/24/2015    Past Surgical History:  Procedure Laterality Date  . thoracocenteis         Home Medications    Prior to Admission medications   Medication Sig Start Date End Date Taking? Authorizing Provider  aspirin-acetaminophen-caffeine (EXCEDRIN MIGRAINE) [phone] MG tablet Take 1-2 tablets by mouth every 6 (six) hours as needed for headache.    Yes [provider]  ezetimibe (ZETIA) 10 MG tablet Take 10 mg by mouth daily. 05/30/17  Yes [provider]  furosemide (LASIX) 40 MG tablet Take 2 tablets (80 mg total) by mouth daily. 01/11/16  Yes Rolly SalterPatel, Pranav M, MD  glipiZIDE (GLUCOTROL) 10 MG tablet Take 10 mg by mouth 2 (two) times daily. 05/09/17  Yes [provider]  hydrOXYzine (VISTARIL) 25 MG capsule TAKE ONE CAPSULE BY MOUTH 4 TIMES A DAY 05/30/17  Yes [provider]  LORazepam (ATIVAN) 1 MG tablet Take 1 mg by mouth 2 (two) times daily.  06/20/17  Yes [provider]  mometasone (NASONEX) 50 MCG/ACT nasal spray Place 2 sprays into the nose daily.  06/03/17  Yes [provider]  spironolactone (ALDACTONE) 100 MG tablet Take 2 tablets (200 mg total) by mouth daily. 01/11/16  Yes Rolly SalterPatel, Pranav M, MD  benzonatate (TESSALON) 100 MG capsule Take 1 capsule (100 mg total) by mouth 3 (three) times daily as needed for cough. Patient not taking: Reported on 06/26/2017 12/26/15   Jeralyn BennettZamora, Ezequiel, MD  LORazepam (ATIVAN) 0.5 MG tablet Take 1 tablet (0.5 mg total) by mouth 2 (two) times daily as needed for anxiety. Patient not taking: Reported on 06/26/2017 12/26/15   Jeralyn BennettZamora, Ezequiel, MD    Family History Family History  Problem Relation Age of Onset  . Heart attack Mother   . Heart attack Father   . Diabetes Mellitus II Father     Social History Social History   Tobacco Use  . Smoking status: Former Games developermoker  . Smokeless tobacco: Never Used  Substance Use Topics  . Alcohol use: Yes    Comment: every other day - drinks beer 6 pack   . Drug use: No     Allergies   Latex   Review of Systems Review of Systems  Constitutional: Negative for appetite change and fever.  HENT: Negative for congestion.   Respiratory: Negative for shortness of breath.  Cardiovascular: Positive for leg swelling. Negative for chest pain.  Gastrointestinal: Positive for abdominal distention. Negative for abdominal pain.  Genitourinary: Negative for flank pain.  Musculoskeletal: Negative for back pain.  Skin: Negative for rash.  Neurological: Positive for syncope.  Hematological: Does not bruise/bleed easily.  Psychiatric/Behavioral: Negative for confusion.     Physical Exam Updated Vital Signs BP (!) 132/91 (BP Location: Left Arm)   Pulse 93   Temp 98.3 F (36.8 C) (Oral)   Resp 19   Ht 5\' 6"  (1.676 m)   Wt 89.8 kg (198 lb)   SpO2 100%   BMI 31.96 kg/m   Physical Exam  Constitutional: He appears well-developed.  HENT:    Head: Atraumatic.  Eyes: Pupils are equal, round, and reactive to light.  Neck: Neck supple.  Cardiovascular: Normal rate.  Pulmonary/Chest: Effort normal. He has no wheezes. He has no rales.  Abdominal: He exhibits distension.  Mild distention with no rebound or guarding.  No real tenderness.  Musculoskeletal: He exhibits edema.  Edema bilateral lower extremities.  Skin: Skin is warm. Capillary refill takes less than 2 seconds.  Psychiatric: He has a normal mood and affect.     ED Treatments / Results  Labs (all labs ordered are listed, but only abnormal results are displayed) Labs Reviewed  CBC WITH DIFFERENTIAL/PLATELET - Abnormal; Notable for the following components:      Result Value   Platelets 90 (*)    All other components within normal limits  BASIC METABOLIC PANEL - Abnormal; Notable for the following components:   Glucose, Bld 118 (*)    BUN 22 (*)    All other components within normal limits  CBG MONITORING, ED - Abnormal; Notable for the following components:   Glucose-Capillary 125 (*)    All other components within normal limits  TROPONIN I    EKG  EKG Interpretation  Date/Time:  Monday June 26 2017 12:46:04 EST Ventricular Rate:  87 PR Interval:    QRS Duration: 100 QT Interval:  398 QTC Calculation: 479 R Axis:   -44 Text Interpretation:  Sinus rhythm Abnormal R-wave progression, early transition Inferior infarct, old No significant change since last tracing Confirmed by Lorre Nick ([id number]) on 06/26/2017 12:51:05 PM Also confirmed by Benjiman Core [phone])  on 06/26/2017 7:43:39 PM       Radiology Dg Chest 2 View  Result Date: 06/26/2017 CLINICAL DATA:  Syncope.  Hypertension. EXAM: CHEST  2 VIEW COMPARISON:  January 11, 2016 FINDINGS: There is no edema or consolidation. The heart size and pulmonary vascularity are normal. No adenopathy. No pneumothorax. There is degenerative change in the lower thoracic spine. IMPRESSION: No edema or  consolidation. Electronically Signed   By: Bretta Bang III M.D.   On: 06/26/2017 20:22    Procedures Procedures (including critical care time)  Medications Ordered in ED Medications - No data to display   Initial Impression / Assessment and Plan / ED Course  I have reviewed the triage vital signs and the nursing notes.  Pertinent labs & imaging results that were available during my care of the patient were reviewed by me and considered in my medical decision making (see chart for details).     Patient with loss conscious.  Began at work.  Has had lightheadedness for a while.  Has history of cirrhosis.  Just went back to work has been working more than he thought he was going to be able to.  Workup overall reassuring.  Tolerate orals.  Likely this was just due to over exertion.  Doubt this is ischemic cause or arrhythmia.  Will discharge home to follow with PCP and GI as needed.  Final Clinical Impressions(s) / ED Diagnoses   Final diagnoses:  Syncope, unspecified syncope type  Cirrhosis of liver with ascites, unspecified hepatic cirrhosis type [facility])    ED Discharge Orders    None       Benjiman Core, MD 06/26/17 2219

## 2017-07-31 ENCOUNTER — Ambulatory Visit: Payer: BLUE CROSS/BLUE SHIELD | Admitting: Neurology

## 2017-07-31 ENCOUNTER — Encounter: Payer: Self-pay | Admitting: Neurology

## 2017-07-31 VITALS — BP 140/90 | HR 101 | Ht 66.0 in | Wt 192.0 lb

## 2017-07-31 DIAGNOSIS — R55 Syncope and collapse: Secondary | ICD-10-CM

## 2017-07-31 DIAGNOSIS — R0683 Snoring: Secondary | ICD-10-CM

## 2017-07-31 DIAGNOSIS — G4719 Other hypersomnia: Secondary | ICD-10-CM

## 2017-07-31 DIAGNOSIS — R404 Transient alteration of awareness: Secondary | ICD-10-CM | POA: Diagnosis not present

## 2017-07-31 NOTE — Patient Instructions (Addendum)
MRI brain w/wo contrast EEG Sleep evaluation  Vasovagal Syncope, Adult Syncope, which is commonly known as fainting or passing out, is a temporary loss of consciousness. It occurs when the blood flow to the brain is reduced. Vasovagal syncope, also called neurocardiogenic syncope, is a fainting spell that happens when blood flow to the brain is reduced because of a sudden drop in heart rate and blood pressure. Vasovagal syncope is usually harmless. However, you can get injured if you fall during a fainting spell. What are the causes? This condition is caused by a drop in heart rate and blood pressure, usually in response to a trigger. Many things and situations can trigger an episode, including:  Pain.  Fear.  The sight of blood. This may occur during medical procedures, such as when blood is being drawn from a vein.  Common activities, such as coughing, swallowing, stretching, or going to the bathroom.  Emotional stress.  Being in a confined space.  Prolonged standing, especially in a warm environment.  Lack of sleep or rest.  Not eating for a long time.  Not drinking enough liquids.  Recent illness.  Drinking alcohol.  Taking drugs that affect blood pressure, such as marijuana, cocaine, opiates, or inhalants.  What are the signs or symptoms? Before a fainting episode, you may:  Feel dizzy or light-headed.  Become pale.  Sense that you are going to faint.  Feel like the room is spinning.  Only see directly ahead (tunnel vision).  Feel sick to your stomach (nauseous).  See spots.  Slowly lose vision.  Hear ringing in your ears.  Have a headache.  Feel warm and sweaty.  Feel a sensation of pins and needles.  During the fainting spell, you may twitch or make jerky movements. Fainting spells usually last no longer than a few minutes before you wake up. If you get up too quickly before your body can recover, you may faint again. How is this diagnosed? This  condition is diagnosed based on your symptoms, your medical history, and a physical exam. Tests may be done to rule out other causes of fainting. Tests may include:  Blood tests.  Heart tests, such as an electrocardiogram (ECG), echocardiogram, or electrophysiology study.  A test to check your response to changes in position (tilt table test).  How is this treated? Usually, treatment is not needed for this condition. Your health care provider may suggest ways to help prevent fainting episodes. These may include:  Drinking additional fluids if you are exposed to a trigger.  Sitting or lying down if you notice signs that an episode is coming.  If your fainting spells continue, your health care provider may recommend that you:  Take medicines to prevent fainting or to help reduce further episodes of fainting.  Do certain exercises.  Wear compression stockings.  Have surgery to place a pacemaker in your body (rare).  Follow these instructions at home:  Learn to identify the signs that an episode is coming.  Sit or lie down at the first sign of a fainting spell. If you sit down, put your head down between your legs. If you lie down, swing your legs up in the air to increase blood flow to the brain.  Avoid hot tubs and saunas.  Avoid standing for a long time. If you have to stand for a long time, try: ? Crossing your legs. ? Flexing and stretching your leg muscles. ? Squatting. ? Moving your legs. ? Bending over.  Drink enough  fluid to keep your urine clear or pale yellow.  Make changes to your diet that your health care provider recommends. You may be told to: ? Avoid caffeine. ? Eat more salt.  Take over-the-counter and prescription medicines only as told by your health care provider. Contact a health care provider if:  You continue to have fainting spells despite treatment.  You faint more often despite treatment.  You lose consciousness for more than a few  minutes.  You faint during or after exercising or after being startled.  You have twitching or jerky movements for longer than a few seconds during a fainting spell.  You have an episode of twitching or jerky movements without fainting. Get help right away if:  A fainting spell leads to an injury or bleeding.  You have new symptoms that occur with the fainting spells, such as: ? Shortness of breath. ? Chest pain. ? Irregular heartbeat.  You twitch or make jerky movements for more than 5 minutes.  You twitch or make jerky movements during more than one fainting spell. This information is not intended to replace advice given to you by your health care provider. Make sure you discuss any questions you have with your health care provider. Document Released: 04/25/2012 Document Revised: 10/21/2015 Document Reviewed: 03/07/2015 Elsevier Interactive Patient Education  2018 Elsevier Inc.    Sleep Apnea Sleep apnea is a condition that affects breathing. People with sleep apnea have moments during sleep when their breathing pauses briefly or gets shallow. Sleep apnea can cause these symptoms:  Trouble staying asleep.  Sleepiness or tiredness during the day.  Irritability.  Loud snoring.  Morning headaches.  Trouble concentrating.  Forgetting things.  Less interest in sex.  Being sleepy for no reason.  Mood swings.  Personality changes.  Depression.  Waking up a lot during the night to pee (urinate).  Dry mouth.  Sore throat.  Follow these instructions at home:  Make any changes in your routine that your doctor recommends.  Eat a healthy, well-balanced diet.  Take over-the-counter and prescription medicines only as told by your doctor.  Avoid using alcohol, calming medicines (sedatives), and narcotic medicines.  Take steps to lose weight if you are overweight.  If you were given a machine (device) to use while you sleep, use it only as told by your  doctor.  Do not use any tobacco products, such as cigarettes, chewing tobacco, and e-cigarettes. If you need help quitting, ask your doctor.  Keep all follow-up visits as told by your doctor. This is important. Contact a doctor if:  The machine that you were given to use during sleep is uncomfortable or does not seem to be working.  Your symptoms do not get better.  Your symptoms get worse. Get help right away if:  Your chest hurts.  You have trouble breathing in enough air (shortness of breath).  You have an uncomfortable feeling in your back, arms, or stomach.  You have trouble talking.  One side of your body feels weak.  A part of your face is hanging down (drooping). These symptoms may be an emergency. Do not wait to see if the symptoms will go away. Get medical help right away. Call your local emergency services (911 in the [place] Do not drive yourself to the hospital. This information is not intended to replace advice given to you by your health care provider. Make sure you discuss any questions you have with your health care provider. Document Released: 02/16/2008 Document Revised:  01/03/2016 Document Reviewed: 02/16/2015 Elsevier Interactive Patient Education  [employer].

## 2017-07-31 NOTE — Progress Notes (Signed)
[facility]    Provider:  Dr Lucia GaskinsAhern Referring Provider: [name], [name], MD Primary Care Physician:  [name], [name], MD  CC:  Lightheadedness, dizziness, black outs    HPI:  Connor Vaughn is a 57 y.o. male here as a referral from Dr. Egbert GaribaldiSlatosky for syncope.,  past medical history high blood pressure, diabetes, high cholesterol, depression and anxiety, etoh abuse, hep c. Cirrhosis of the liver, diabetes on glipizide with good control. On Feb 4th he got up, ate breakfast, he was standing for a long time and was bending down and loading down and bending over a lot and he looked white, went to the break room, no los sof consciousness, tunnel vision, white, sweaty. Nearly lost consciousness. His BP was 190 systolic the second time. Second episode was similar. His blood pressures have been fluctuating as well as his glucose. He feels his memory is worsening, no alteration of awareness, no seizure activity. No hx of seizures in family or personal. This is all in the setting of stress and decreased sleep. He snores a lot. Daughter says you can hear him from a different room. He is very tired during the day.  Reviewed notes, labs and imaging from outside physicians, which showed:  Reviewed referring physician notes.  Patient started feeling pale and dizzy the beginning of February of this year.  Object started to appear like they are far away, his vision was like he was looking through a tunnel, he had slurred speech and tinnitus, his vision was also blurry, he had a hard time understanding what was being said to him, he was taken to urgent care and had EKG done, [facility], he had a workup.  He had another episode in February 28 which lasted 4-5 hours.  He stated home until symptoms resolved.  CT head 12/2015 showed No acute intracranial abnormalities including mass lesion or mass effect, hydrocephalus, extra-axial fluid collection, midline shift,  hemorrhage, or acute infarction, large ischemic events (personally reviewed images)  Review of Systems: Patient complains of symptoms per HPI as well as the following symptoms: Weight gain, fatigue, blurred vision, easy bruising, easy bleeding, cough, snoring, swelling in legs, spinning in ears, cramps, rash, memory loss, confusion, headache, numbness, slurred speech, dizziness, passing out, tremor, insomnia, snoring, restless legs, depression, anxiety, not enough sleep, decreased energy, change in appetite, disinterest in activities, racing thoughts. Pertinent negatives and positives per HPI. All others negative.   Social History   Socioeconomic History  . Marital status: Married    Spouse name: Not on file  . Number of children: 2  . Years of education: Not on file  . Highest education level: High school graduate  Social Needs  . Financial resource strain: Not on file  . Food insecurity - worry: Not on file  . Food insecurity - inability: Not on file  . Transportation needs - medical: Not on file  . Transportation needs - non-medical: Not on file  Occupational History  . Not on file  Tobacco Use  . Smoking status: Former Smoker    Types: Cigarettes    Last attempt to quit: 2010    Years since quitting: 9.1  . Smokeless tobacco: Never Used  Substance and Sexual Activity  . Alcohol use: Yes    Comment: "pint every few days"  . Drug use: No  . Sexual activity: Yes  Other Topics Concern  . Not on file  Social History Narrative   Lives  at home with his wife   Right handed   No caffeine    Family History  Problem Relation Age of Onset  . Diabetes Mother   . Heart failure Mother   . Valvular heart disease Mother   . Diabetes Mellitus II Father   . Heart failure Father   . Dementia Father   . Seizures Neg Hx     Past Medical History:  Diagnosis Date  . Cirrhosis (HCC)   . Diabetes mellitus, type II (HCC)   . H/O fracture    nasal x 2    Past Surgical History:    Procedure Laterality Date  . thoracocenteis      Current Outpatient Medications  Medication Sig Dispense Refill  . ezetimibe (ZETIA) 10 MG tablet Take 10 mg by mouth daily.  5  . furosemide (LASIX) 40 MG tablet Take 2 tablets (80 mg total) by mouth daily. 30 tablet 0  . glipiZIDE (GLUCOTROL) 10 MG tablet Take 10 mg by mouth daily before breakfast.   5  . hydrOXYzine (VISTARIL) 25 MG capsule TAKE ONE CAPSULE BY MOUTH 4 TIMES A DAY  0  . LORazepam (ATIVAN) 1 MG tablet Take 1 mg by mouth 2 (two) times daily.  5  . mometasone (NASONEX) 50 MCG/ACT nasal spray Place 2 sprays into the nose daily.   5  . spironolactone (ALDACTONE) 100 MG tablet Take 2 tablets (200 mg total) by mouth daily. 30 tablet 1  . aspirin-acetaminophen-caffeine (EXCEDRIN MIGRAINE) 250-250-65 MG tablet Take 1-2 tablets by mouth every 6 (six) hours as needed for headache.      No current facility-administered medications for this visit.     Allergies as of 07/31/2017 - Review Complete 07/31/2017  Allergen Reaction Noted  . Latex Rash 09/07/2015    Vitals: BP 140/90 (BP Location: Left Arm, Patient Position: Sitting)   Pulse (!) 101   Ht 5\' 6"  (1.676 m)   Wt 192 lb (87.1 kg)   BMI 30.99 kg/m  Last Weight:  Wt Readings from Last 1 Encounters:  07/31/17 192 lb (87.1 kg)   Last Height:   Ht Readings from Last 1 Encounters:  07/31/17 5\' 6"  (1.676 m)   Physical exam: Exam: Gen: NAD, conversant, well nourised, obese, well groomed                     CV: RRR, no MRG. No Carotid Bruits. No peripheral edema, warm, nontender Eyes: Conjunctivae clear without exudates or hemorrhage  Neuro: Detailed Neurologic Exam  Speech:    Speech is normal; fluent and spontaneous with normal comprehension.  Cognition:    The patient is oriented to person, place, and time;     recent and remote memory intact;     language fluent;     normal attention, concentration,     fund of knowledge Cranial Nerves:    The pupils are  equal, round, and reactive to light. The fundi are normal and spontaneous venous pulsations are present. Visual fields are full to finger confrontation. Extraocular movements are intact. Trigeminal sensation is intact and the muscles of mastication are normal. The face is symmetric. The palate elevates in the midline. Hearing intact. Voice is normal. Shoulder shrug is normal. The tongue has normal motion without fasciculations.   Coordination:    Normal finger to nose and heel to shin. Normal rapid alternating movements.   Gait:    Heel-toe and tandem gait are normal.   Motor Observation:  No asymmetry, no atrophy, and no involuntary movements noted. Tone:    Normal muscle tone.    Posture:    Posture is normal. normal erect    Strength:    Strength is V/V in the upper and lower limbs.      Sensation: intact to LT     Reflex Exam:  DTR's:    Deep tendon reflexes in the upper and lower extremities are normal bilaterally.   Toes:    The toes are downgoing bilaterally.   Clonus:    Clonus is absent      Assessment/Plan: Patient has episodes that appear to be consistent with vasovagal syncope.  Will evaluate for seizures however this is low likelihood.     - MRI of the brain with and without contrast seizure protocol - EEG - If workup negative patient can follow-up as needed refer back to primary care. -  He snores a lot. Daughter says you can hear him from a different room. He is very tired during the day.Sleep eval. - very low likelihood seizures - precautions at work for safety - Discussed precautions for vasovagal syncope and things he can do to help  Orders Placed This Encounter  Procedures  . MR BRAIN W WO CONTRAST  . Ambulatory referral to Sleep Studies  . EEG   Cc:  [name], [name], MD  Naomie Dean, MD  [name] [address] [place], [place] [address]  Phone [phone] Fax [phone]

## 2017-08-01 ENCOUNTER — Telehealth: Payer: Self-pay | Admitting: Neurology

## 2017-08-01 NOTE — Telephone Encounter (Signed)
Connor EssexBCBS Berkley Harveyauth [phone] (08-29-17) called and spoke with the patient, regarding the MRI, he wants to hold off on scheduling due to cost. DW

## 2017-08-21 ENCOUNTER — Other Ambulatory Visit: Payer: BLUE CROSS/BLUE SHIELD

## 2018-05-12 ENCOUNTER — Encounter (HOSPITAL_COMMUNITY): Payer: Self-pay

## 2018-05-12 ENCOUNTER — Emergency Department (HOSPITAL_COMMUNITY)
Admission: EM | Admit: 2018-05-12 | Discharge: 2018-05-12 | Disposition: A | Discharge status: Home / Self Care | Payer: BLUE CROSS/BLUE SHIELD | Attending: Emergency Medicine | Admitting: Emergency Medicine

## 2018-05-12 ENCOUNTER — Other Ambulatory Visit: Payer: Self-pay

## 2018-05-12 ENCOUNTER — Emergency Department (HOSPITAL_COMMUNITY): Payer: BLUE CROSS/BLUE SHIELD

## 2018-05-12 DIAGNOSIS — S161XXA Strain of muscle, fascia and tendon at neck level, initial encounter: Secondary | ICD-10-CM | POA: Insufficient documentation

## 2018-05-12 DIAGNOSIS — Y9389 Activity, other specified: Secondary | ICD-10-CM | POA: Diagnosis not present

## 2018-05-12 DIAGNOSIS — S39012A Strain of muscle, fascia and tendon of lower back, initial encounter: Secondary | ICD-10-CM | POA: Diagnosis not present

## 2018-05-12 DIAGNOSIS — R55 Syncope and collapse: Secondary | ICD-10-CM | POA: Diagnosis not present

## 2018-05-12 DIAGNOSIS — Y92410 Unspecified street and highway as the place of occurrence of the external cause: Secondary | ICD-10-CM | POA: Insufficient documentation

## 2018-05-12 DIAGNOSIS — S6992XA Unspecified injury of left wrist, hand and finger(s), initial encounter: Secondary | ICD-10-CM | POA: Diagnosis present

## 2018-05-12 DIAGNOSIS — Z7984 Long term (current) use of oral hypoglycemic drugs: Secondary | ICD-10-CM | POA: Insufficient documentation

## 2018-05-12 DIAGNOSIS — F1099 Alcohol use, unspecified with unspecified alcohol-induced disorder: Secondary | ICD-10-CM | POA: Insufficient documentation

## 2018-05-12 DIAGNOSIS — Y999 Unspecified external cause status: Secondary | ICD-10-CM | POA: Diagnosis not present

## 2018-05-12 DIAGNOSIS — Z87891 Personal history of nicotine dependence: Secondary | ICD-10-CM | POA: Diagnosis not present

## 2018-05-12 DIAGNOSIS — S65511A Laceration of blood vessel of left index finger, initial encounter: Secondary | ICD-10-CM | POA: Diagnosis not present

## 2018-05-12 DIAGNOSIS — Y906 Blood alcohol level of 120-199 mg/100 ml: Secondary | ICD-10-CM | POA: Insufficient documentation

## 2018-05-12 DIAGNOSIS — S20211A Contusion of right front wall of thorax, initial encounter: Secondary | ICD-10-CM | POA: Insufficient documentation

## 2018-05-12 DIAGNOSIS — Z79899 Other long term (current) drug therapy: Secondary | ICD-10-CM | POA: Diagnosis not present

## 2018-05-12 DIAGNOSIS — E119 Type 2 diabetes mellitus without complications: Secondary | ICD-10-CM | POA: Diagnosis not present

## 2018-05-12 DIAGNOSIS — Z91040 Latex allergy status: Secondary | ICD-10-CM | POA: Insufficient documentation

## 2018-05-12 DIAGNOSIS — R202 Paresthesia of skin: Secondary | ICD-10-CM | POA: Insufficient documentation

## 2018-05-12 LAB — CBC WITH DIFFERENTIAL/PLATELET
Abs Immature Granulocytes: 0.03 10*3/uL (ref 0.00–0.07)
Basophils Absolute: 0.1 10*3/uL (ref 0.0–0.1)
Basophils Relative: 1 %
Eosinophils Absolute: 0.2 10*3/uL (ref 0.0–0.5)
Eosinophils Relative: 2 %
HCT: 46.7 % (ref 39.0–52.0)
Hemoglobin: 15.9 g/dL (ref 13.0–17.0)
Immature Granulocytes: 0 %
Lymphocytes Relative: 26 %
Lymphs Abs: 2.3 10*3/uL (ref 0.7–4.0)
MCH: 30.2 pg (ref 26.0–34.0)
MCHC: 34 g/dL (ref 30.0–36.0)
MCV: 88.6 fL (ref 80.0–100.0)
Monocytes Absolute: 0.9 10*3/uL (ref 0.1–1.0)
Monocytes Relative: 10 %
Neutro Abs: 5.3 10*3/uL (ref 1.7–7.7)
Neutrophils Relative %: 61 %
Platelets: 154 10*3/uL (ref 150–400)
RBC: 5.27 MIL/uL (ref 4.22–5.81)
RDW: 14.4 % (ref 11.5–15.5)
WBC: 8.8 10*3/uL (ref 4.0–10.5)
nRBC: 0 % (ref 0.0–0.2)

## 2018-05-12 LAB — CBG MONITORING, ED: Glucose-Capillary: 137 mg/dL — ABNORMAL HIGH (ref 70–99)

## 2018-05-12 LAB — BASIC METABOLIC PANEL
Anion gap: 12 (ref 5–15)
BUN: 11 mg/dL (ref 6–20)
CO2: 24 mmol/L (ref 22–32)
Calcium: 9.4 mg/dL (ref 8.9–10.3)
Chloride: 102 mmol/L (ref 98–111)
Creatinine, Ser: 1.23 mg/dL (ref 0.61–1.24)
GFR calc Af Amer: >60 mL/min (ref >60)
GFR calc non Af Amer: >60 mL/min (ref >60)
Glucose, Bld: 153 mg/dL — ABNORMAL HIGH (ref 70–99)
Potassium: 3.8 mmol/L (ref 3.5–5.1)
Sodium: 138 mmol/L (ref 135–145)

## 2018-05-12 LAB — ETHANOL: Alcohol, Ethyl (B): 161 mg/dL — ABNORMAL HIGH (ref <10)

## 2018-05-12 MED ORDER — IBUPROFEN 400 MG PO TABS
600.0000 mg | ORAL_TABLET | Freq: Once | ORAL | Status: AC
  Administered 2018-05-12: 600 mg via ORAL
  Filled 2018-05-12: qty 1

## 2018-05-12 MED ORDER — OXYCODONE-ACETAMINOPHEN 5-325 MG PO TABS
1.0000 | ORAL_TABLET | Freq: Once | ORAL | Status: AC
  Administered 2018-05-12: 1 via ORAL
  Filled 2018-05-12: qty 1

## 2018-05-12 MED ORDER — LIDOCAINE HCL (PF) 1 % IJ SOLN
10.0000 mg | Freq: Once | INTRAMUSCULAR | Status: AC
  Administered 2018-05-12: 10 mg
  Filled 2018-05-12: qty 5

## 2018-05-12 MED ORDER — ONDANSETRON HCL 4 MG PO TABS
4.0000 mg | ORAL_TABLET | Freq: Once | ORAL | Status: AC
  Administered 2018-05-12: 4 mg via ORAL
  Filled 2018-05-12: qty 1

## 2018-05-12 MED ORDER — METHOCARBAMOL 500 MG PO TABS: 500.0000 mg | ORAL_TABLET | Freq: Three times a day (TID) | ORAL | 0 refills | Status: DC | PRN

## 2018-05-12 NOTE — ED Notes (Addendum)
Pt. Ambulate to bathroom with steady gait. 

## 2018-05-12 NOTE — ED Notes (Signed)
ED Provider at bedside. 
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
## 2018-05-12 NOTE — ED Notes (Signed)
Please call daughter Daine Florasmber Jadeyn Hargett at [phone] to update on whether pt will be discharged. She will need to pick him up.

## 2018-05-12 NOTE — ED Triage Notes (Signed)
[facility]. EMS reports pt is known diabetic, has only passed out before when his blood sugar dropped too low, CBG 169 en route. Questionable ETOH on board, pt has chronic cirrhosis. Pt is alert and oriented upon arrival, multiple small lacerations on his upper extremities, all bleeding controlled. Pt placed in position of comfort with bed locked and lowered, call bell in reach.
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
## 2018-05-13 ENCOUNTER — Telehealth: Payer: Self-pay | Admitting: Surgery

## 2018-05-13 ENCOUNTER — Other Ambulatory Visit: Payer: Self-pay

## 2018-05-13 NOTE — Telephone Encounter (Addendum)
ED CM received call from patient concerning prescription not being escribed to pharmacy. CM reviewed patient's record, noted prescription was sent to pharmacy, CM discussed with EDP who will call prescription in to pharmacy.

## 2019-04-04 ENCOUNTER — Encounter (HOSPITAL_COMMUNITY): Payer: Self-pay

## 2019-04-04 ENCOUNTER — Inpatient Hospital Stay (HOSPITAL_COMMUNITY): Payer: Self-pay

## 2019-04-04 ENCOUNTER — Emergency Department (HOSPITAL_COMMUNITY): Payer: Self-pay

## 2019-04-04 ENCOUNTER — Inpatient Hospital Stay (HOSPITAL_COMMUNITY)
Admission: EM | Admit: 2019-04-04 | Discharge: 2019-04-16 | DRG: 871 | Disposition: A | Discharge status: Home Health | Payer: Self-pay | Attending: Family Medicine | Admitting: Family Medicine

## 2019-04-04 ENCOUNTER — Other Ambulatory Visit: Payer: Self-pay

## 2019-04-04 DIAGNOSIS — E1110 Type 2 diabetes mellitus with ketoacidosis without coma: Secondary | ICD-10-CM

## 2019-04-04 DIAGNOSIS — B1920 Unspecified viral hepatitis C without hepatic coma: Secondary | ICD-10-CM | POA: Diagnosis present

## 2019-04-04 DIAGNOSIS — K766 Portal hypertension: Secondary | ICD-10-CM | POA: Diagnosis present

## 2019-04-04 DIAGNOSIS — Z20828 Contact with and (suspected) exposure to other viral communicable diseases: Secondary | ICD-10-CM | POA: Diagnosis present

## 2019-04-04 DIAGNOSIS — R0600 Dyspnea, unspecified: Secondary | ICD-10-CM

## 2019-04-04 DIAGNOSIS — Z9119 Patient's noncompliance with other medical treatment and regimen: Secondary | ICD-10-CM

## 2019-04-04 DIAGNOSIS — D696 Thrombocytopenia, unspecified: Secondary | ICD-10-CM | POA: Diagnosis present

## 2019-04-04 DIAGNOSIS — K920 Hematemesis: Secondary | ICD-10-CM | POA: Diagnosis present

## 2019-04-04 DIAGNOSIS — I8510 Secondary esophageal varices without bleeding: Secondary | ICD-10-CM | POA: Diagnosis present

## 2019-04-04 DIAGNOSIS — E860 Dehydration: Secondary | ICD-10-CM | POA: Diagnosis present

## 2019-04-04 DIAGNOSIS — E1142 Type 2 diabetes mellitus with diabetic polyneuropathy: Secondary | ICD-10-CM | POA: Diagnosis present

## 2019-04-04 DIAGNOSIS — N179 Acute kidney failure, unspecified: Secondary | ICD-10-CM | POA: Diagnosis present

## 2019-04-04 DIAGNOSIS — R609 Edema, unspecified: Secondary | ICD-10-CM

## 2019-04-04 DIAGNOSIS — F10139 Alcohol abuse with withdrawal, unspecified: Secondary | ICD-10-CM | POA: Diagnosis present

## 2019-04-04 DIAGNOSIS — K922 Gastrointestinal hemorrhage, unspecified: Secondary | ICD-10-CM

## 2019-04-04 DIAGNOSIS — R05 Cough: Secondary | ICD-10-CM | POA: Diagnosis present

## 2019-04-04 DIAGNOSIS — Z87891 Personal history of nicotine dependence: Secondary | ICD-10-CM

## 2019-04-04 DIAGNOSIS — R6520 Severe sepsis without septic shock: Secondary | ICD-10-CM | POA: Diagnosis present

## 2019-04-04 DIAGNOSIS — E8729 Other acidosis: Secondary | ICD-10-CM | POA: Diagnosis present

## 2019-04-04 DIAGNOSIS — K2920 Alcoholic gastritis without bleeding: Secondary | ICD-10-CM | POA: Diagnosis present

## 2019-04-04 DIAGNOSIS — Z452 Encounter for adjustment and management of vascular access device: Secondary | ICD-10-CM

## 2019-04-04 DIAGNOSIS — Z818 Family history of other mental and behavioral disorders: Secondary | ICD-10-CM

## 2019-04-04 DIAGNOSIS — K921 Melena: Secondary | ICD-10-CM | POA: Diagnosis present

## 2019-04-04 DIAGNOSIS — E872 Acidosis, unspecified: Secondary | ICD-10-CM | POA: Diagnosis present

## 2019-04-04 DIAGNOSIS — K2100 Gastro-esophageal reflux disease with esophagitis, without bleeding: Secondary | ICD-10-CM | POA: Diagnosis present

## 2019-04-04 DIAGNOSIS — R188 Other ascites: Secondary | ICD-10-CM

## 2019-04-04 DIAGNOSIS — K859 Acute pancreatitis without necrosis or infection, unspecified: Secondary | ICD-10-CM | POA: Diagnosis present

## 2019-04-04 DIAGNOSIS — Z590 Homelessness: Secondary | ICD-10-CM

## 2019-04-04 DIAGNOSIS — Z9889 Other specified postprocedural states: Secondary | ICD-10-CM

## 2019-04-04 DIAGNOSIS — T502X5A Adverse effect of carbonic-anhydrase inhibitors, benzothiadiazides and other diuretics, initial encounter: Secondary | ICD-10-CM | POA: Diagnosis present

## 2019-04-04 DIAGNOSIS — Z8249 Family history of ischemic heart disease and other diseases of the circulatory system: Secondary | ICD-10-CM

## 2019-04-04 DIAGNOSIS — J90 Pleural effusion, not elsewhere classified: Secondary | ICD-10-CM

## 2019-04-04 DIAGNOSIS — Z9114 Patient's other noncompliance with medication regimen: Secondary | ICD-10-CM

## 2019-04-04 DIAGNOSIS — E876 Hypokalemia: Secondary | ICD-10-CM | POA: Diagnosis present

## 2019-04-04 DIAGNOSIS — K7011 Alcoholic hepatitis with ascites: Secondary | ICD-10-CM | POA: Diagnosis present

## 2019-04-04 DIAGNOSIS — Z91040 Latex allergy status: Secondary | ICD-10-CM

## 2019-04-04 DIAGNOSIS — R7401 Elevation of levels of liver transaminase levels: Secondary | ICD-10-CM

## 2019-04-04 DIAGNOSIS — K3189 Other diseases of stomach and duodenum: Secondary | ICD-10-CM | POA: Diagnosis present

## 2019-04-04 DIAGNOSIS — E669 Obesity, unspecified: Secondary | ICD-10-CM | POA: Diagnosis present

## 2019-04-04 DIAGNOSIS — F419 Anxiety disorder, unspecified: Secondary | ICD-10-CM | POA: Diagnosis present

## 2019-04-04 DIAGNOSIS — D62 Acute posthemorrhagic anemia: Secondary | ICD-10-CM | POA: Diagnosis present

## 2019-04-04 DIAGNOSIS — R296 Repeated falls: Secondary | ICD-10-CM | POA: Diagnosis present

## 2019-04-04 DIAGNOSIS — E8770 Fluid overload, unspecified: Secondary | ICD-10-CM | POA: Diagnosis present

## 2019-04-04 DIAGNOSIS — R0602 Shortness of breath: Secondary | ICD-10-CM

## 2019-04-04 DIAGNOSIS — J948 Other specified pleural conditions: Secondary | ICD-10-CM | POA: Diagnosis present

## 2019-04-04 DIAGNOSIS — Y900 Blood alcohol level of less than 20 mg/100 ml: Secondary | ICD-10-CM | POA: Diagnosis present

## 2019-04-04 DIAGNOSIS — D649 Anemia, unspecified: Secondary | ICD-10-CM

## 2019-04-04 DIAGNOSIS — Z833 Family history of diabetes mellitus: Secondary | ICD-10-CM

## 2019-04-04 DIAGNOSIS — R059 Cough, unspecified: Secondary | ICD-10-CM

## 2019-04-04 DIAGNOSIS — N5089 Other specified disorders of the male genital organs: Secondary | ICD-10-CM | POA: Diagnosis present

## 2019-04-04 DIAGNOSIS — Z79899 Other long term (current) drug therapy: Secondary | ICD-10-CM

## 2019-04-04 DIAGNOSIS — K7031 Alcoholic cirrhosis of liver with ascites: Secondary | ICD-10-CM | POA: Diagnosis present

## 2019-04-04 DIAGNOSIS — F329 Major depressive disorder, single episode, unspecified: Secondary | ICD-10-CM | POA: Diagnosis present

## 2019-04-04 DIAGNOSIS — Z6835 Body mass index (BMI) 35.0-35.9, adult: Secondary | ICD-10-CM

## 2019-04-04 DIAGNOSIS — A419 Sepsis, unspecified organism: Principal | ICD-10-CM

## 2019-04-04 DIAGNOSIS — J918 Pleural effusion in other conditions classified elsewhere: Secondary | ICD-10-CM | POA: Diagnosis present

## 2019-04-04 DIAGNOSIS — D689 Coagulation defect, unspecified: Secondary | ICD-10-CM | POA: Diagnosis present

## 2019-04-04 DIAGNOSIS — E8809 Other disorders of plasma-protein metabolism, not elsewhere classified: Secondary | ICD-10-CM | POA: Diagnosis present

## 2019-04-04 DIAGNOSIS — R062 Wheezing: Secondary | ICD-10-CM | POA: Diagnosis present

## 2019-04-04 HISTORY — DX: Type 2 diabetes mellitus with ketoacidosis without coma: E11.10

## 2019-04-04 LAB — LACTIC ACID, PLASMA
Lactic Acid, Venous: >11 mmol/L (ref 0.5–1.9)
Lactic Acid, Venous: >11 mmol/L (ref 0.5–1.9)
Lactic Acid, Venous: 6 mmol/L (ref 0.5–1.9)

## 2019-04-04 LAB — HEMOGLOBIN A1C
Hgb A1c MFr Bld: 6.1 % — ABNORMAL HIGH (ref 4.8–5.6)
Hgb A1c MFr Bld: 6.1 % — ABNORMAL HIGH (ref 4.8–5.6)
Mean Plasma Glucose: 128.37 mg/dL
Mean Plasma Glucose: 128.37 mg/dL

## 2019-04-04 LAB — COMPREHENSIVE METABOLIC PANEL
ALT: 286 U/L — ABNORMAL HIGH (ref 0–44)
ALT: 435 U/L — ABNORMAL HIGH (ref 0–44)
AST: 426 U/L — ABNORMAL HIGH (ref 15–41)
AST: 641 U/L — ABNORMAL HIGH (ref 15–41)
Albumin: 3.2 g/dL — ABNORMAL LOW (ref 3.5–5.0)
Albumin: 2.6 g/dL — ABNORMAL LOW (ref 3.5–5.0)
Alkaline Phosphatase: 59 U/L (ref 38–126)
Alkaline Phosphatase: 48 U/L (ref 38–126)
Anion gap: 33 — ABNORMAL HIGH (ref 5–15)
Anion gap: 19 — ABNORMAL HIGH (ref 5–15)
BUN: 52 mg/dL — ABNORMAL HIGH (ref 6–20)
BUN: 52 mg/dL — ABNORMAL HIGH (ref 6–20)
CO2: 9 mmol/L — ABNORMAL LOW (ref 22–32)
CO2: 14 mmol/L — ABNORMAL LOW (ref 22–32)
Calcium: 8.2 mg/dL — ABNORMAL LOW (ref 8.9–10.3)
Calcium: 7.1 mg/dL — ABNORMAL LOW (ref 8.9–10.3)
Chloride: 89 mmol/L — ABNORMAL LOW (ref 98–111)
Chloride: 101 mmol/L (ref 98–111)
Creatinine, Ser: 2.12 mg/dL — ABNORMAL HIGH (ref 0.61–1.24)
Creatinine, Ser: 1.79 mg/dL — ABNORMAL HIGH (ref 0.61–1.24)
GFR calc Af Amer: 39 mL/min — ABNORMAL LOW (ref >60)
GFR calc Af Amer: 48 mL/min — ABNORMAL LOW (ref >60)
GFR calc non Af Amer: 34 mL/min — ABNORMAL LOW (ref >60)
GFR calc non Af Amer: 41 mL/min — ABNORMAL LOW (ref >60)
Glucose, Bld: 424 mg/dL — ABNORMAL HIGH (ref 70–99)
Glucose, Bld: 243 mg/dL — ABNORMAL HIGH (ref 70–99)
Potassium: 4 mmol/L (ref 3.5–5.1)
Potassium: 3.7 mmol/L (ref 3.5–5.1)
Sodium: 131 mmol/L — ABNORMAL LOW (ref 135–145)
Sodium: 134 mmol/L — ABNORMAL LOW (ref 135–145)
Total Bilirubin: 2.4 mg/dL — ABNORMAL HIGH (ref 0.3–1.2)
Total Bilirubin: 1.5 mg/dL — ABNORMAL HIGH (ref 0.3–1.2)
Total Protein: 5.1 g/dL — ABNORMAL LOW (ref 6.5–8.1)
Total Protein: 4.1 g/dL — ABNORMAL LOW (ref 6.5–8.1)

## 2019-04-04 LAB — BASIC METABOLIC PANEL
Anion gap: 22 — ABNORMAL HIGH (ref 5–15)
Anion gap: 10 (ref 5–15)
BUN: 51 mg/dL — ABNORMAL HIGH (ref 6–20)
BUN: 51 mg/dL — ABNORMAL HIGH (ref 6–20)
CO2: 13 mmol/L — ABNORMAL LOW (ref 22–32)
CO2: 21 mmol/L — ABNORMAL LOW (ref 22–32)
Calcium: 7.4 mg/dL — ABNORMAL LOW (ref 8.9–10.3)
Calcium: 7.1 mg/dL — ABNORMAL LOW (ref 8.9–10.3)
Chloride: 100 mmol/L (ref 98–111)
Chloride: 104 mmol/L (ref 98–111)
Creatinine, Ser: 1.83 mg/dL — ABNORMAL HIGH (ref 0.61–1.24)
Creatinine, Ser: 1.58 mg/dL — ABNORMAL HIGH (ref 0.61–1.24)
GFR calc Af Amer: 47 mL/min — ABNORMAL LOW (ref >60)
GFR calc Af Amer: 56 mL/min — ABNORMAL LOW (ref >60)
GFR calc non Af Amer: 40 mL/min — ABNORMAL LOW (ref >60)
GFR calc non Af Amer: 48 mL/min — ABNORMAL LOW (ref >60)
Glucose, Bld: 233 mg/dL — ABNORMAL HIGH (ref 70–99)
Glucose, Bld: 179 mg/dL — ABNORMAL HIGH (ref 70–99)
Potassium: 3.6 mmol/L (ref 3.5–5.1)
Potassium: 3.6 mmol/L (ref 3.5–5.1)
Sodium: 135 mmol/L (ref 135–145)
Sodium: 135 mmol/L (ref 135–145)

## 2019-04-04 LAB — URINALYSIS, ROUTINE W REFLEX MICROSCOPIC
Bilirubin Urine: NEGATIVE
Glucose, UA: >=500 mg/dL — AB
Hgb urine dipstick: NEGATIVE
Ketones, ur: 5 mg/dL — AB
Leukocytes,Ua: NEGATIVE
Nitrite: NEGATIVE
Protein, ur: NEGATIVE mg/dL
Specific Gravity, Urine: 1.017 (ref 1.005–1.030)
pH: 5 (ref 5.0–8.0)

## 2019-04-04 LAB — BLOOD GAS, VENOUS
Acid-base deficit: 16.6 mmol/L — ABNORMAL HIGH (ref 0.0–2.0)
Bicarbonate: 9.5 mmol/L — ABNORMAL LOW (ref 20.0–28.0)
O2 Saturation: 56.1 %
Patient temperature: 98.6
pCO2, Ven: 23.5 mmHg — ABNORMAL LOW (ref 44.0–60.0)
pH, Ven: 7.229 — ABNORMAL LOW (ref 7.250–7.430)
pO2, Ven: 41.3 mmHg (ref 32.0–45.0)

## 2019-04-04 LAB — GLUCOSE, CAPILLARY
Glucose-Capillary: 122 mg/dL — ABNORMAL HIGH (ref 70–99)
Glucose-Capillary: 128 mg/dL — ABNORMAL HIGH (ref 70–99)
Glucose-Capillary: 132 mg/dL — ABNORMAL HIGH (ref 70–99)
Glucose-Capillary: 142 mg/dL — ABNORMAL HIGH (ref 70–99)
Glucose-Capillary: 163 mg/dL — ABNORMAL HIGH (ref 70–99)
Glucose-Capillary: 168 mg/dL — ABNORMAL HIGH (ref 70–99)
Glucose-Capillary: 182 mg/dL — ABNORMAL HIGH (ref 70–99)
Glucose-Capillary: 218 mg/dL — ABNORMAL HIGH (ref 70–99)

## 2019-04-04 LAB — CBC WITH DIFFERENTIAL/PLATELET
Abs Immature Granulocytes: 0.99 10*3/uL — ABNORMAL HIGH (ref 0.00–0.07)
Basophils Absolute: 0.1 10*3/uL (ref 0.0–0.1)
Basophils Relative: 0 %
Eosinophils Absolute: 0 10*3/uL (ref 0.0–0.5)
Eosinophils Relative: 0 %
HCT: 22.1 % — ABNORMAL LOW (ref 39.0–52.0)
Hemoglobin: 7 g/dL — ABNORMAL LOW (ref 13.0–17.0)
Immature Granulocytes: 3 %
Lymphocytes Relative: 10 %
Lymphs Abs: 3.6 10*3/uL (ref 0.7–4.0)
MCH: 30.8 pg (ref 26.0–34.0)
MCHC: 31.7 g/dL (ref 30.0–36.0)
MCV: 97.4 fL (ref 80.0–100.0)
Monocytes Absolute: 4.2 10*3/uL — ABNORMAL HIGH (ref 0.1–1.0)
Monocytes Relative: 12 %
Neutro Abs: 27 10*3/uL — ABNORMAL HIGH (ref 1.7–7.7)
Neutrophils Relative %: 75 %
Platelets: 233 10*3/uL (ref 150–400)
RBC: 2.27 MIL/uL — ABNORMAL LOW (ref 4.22–5.81)
RDW: 15.2 % (ref 11.5–15.5)
WBC: 35.9 10*3/uL — ABNORMAL HIGH (ref 4.0–10.5)
nRBC: 0.2 % (ref 0.0–0.2)

## 2019-04-04 LAB — APTT: aPTT: 28 seconds (ref 24–36)

## 2019-04-04 LAB — CBC
HCT: 17.5 % — ABNORMAL LOW (ref 39.0–52.0)
Hemoglobin: 5.6 g/dL — CL (ref 13.0–17.0)
MCH: 30.6 pg (ref 26.0–34.0)
MCHC: 32 g/dL (ref 30.0–36.0)
MCV: 95.6 fL (ref 80.0–100.0)
Platelets: 180 10*3/uL (ref 150–400)
RBC: 1.83 MIL/uL — ABNORMAL LOW (ref 4.22–5.81)
RDW: 15.5 % (ref 11.5–15.5)
WBC: 32.7 10*3/uL — ABNORMAL HIGH (ref 4.0–10.5)
nRBC: 0.2 % (ref 0.0–0.2)

## 2019-04-04 LAB — SARS CORONAVIRUS 2 (TAT 6-24 HRS): SARS Coronavirus 2: NEGATIVE

## 2019-04-04 LAB — PROTIME-INR
INR: 2.3 — ABNORMAL HIGH (ref 0.8–1.2)
Prothrombin Time: 25 seconds — ABNORMAL HIGH (ref 11.4–15.2)

## 2019-04-04 LAB — PREPARE RBC (CROSSMATCH)

## 2019-04-04 LAB — CBG MONITORING, ED
Glucose-Capillary: 431 mg/dL — ABNORMAL HIGH (ref 70–99)
Glucose-Capillary: 325 mg/dL — ABNORMAL HIGH (ref 70–99)
Glucose-Capillary: 304 mg/dL — ABNORMAL HIGH (ref 70–99)
Glucose-Capillary: 261 mg/dL — ABNORMAL HIGH (ref 70–99)

## 2019-04-04 LAB — BETA-HYDROXYBUTYRIC ACID: Beta-Hydroxybutyric Acid: 0.4 mmol/L — ABNORMAL HIGH (ref 0.05–0.27)

## 2019-04-04 LAB — MRSA PCR SCREENING: MRSA by PCR: NEGATIVE

## 2019-04-04 LAB — HIV ANTIBODY (ROUTINE TESTING W REFLEX): HIV Screen 4th Generation wRfx: NONREACTIVE

## 2019-04-04 LAB — PROCALCITONIN: Procalcitonin: 0.16 ng/mL

## 2019-04-04 LAB — PHOSPHORUS: Phosphorus: 2.4 mg/dL — ABNORMAL LOW (ref 2.5–4.6)

## 2019-04-04 LAB — ETHANOL: Alcohol, Ethyl (B): <10 mg/dL (ref <10)

## 2019-04-04 LAB — POC OCCULT BLOOD, ED: Fecal Occult Bld: POSITIVE — AB

## 2019-04-04 LAB — LIPASE, BLOOD: Lipase: 162 U/L — ABNORMAL HIGH (ref 11–51)

## 2019-04-04 LAB — MAGNESIUM: Magnesium: 1.8 mg/dL (ref 1.7–2.4)

## 2019-04-04 LAB — ABO/RH: ABO/RH(D): O POS

## 2019-04-04 MED ORDER — CHLORHEXIDINE GLUCONATE CLOTH 2 % EX PADS
6.0000 | MEDICATED_PAD | Freq: Every day | CUTANEOUS | Status: DC
  Administered 2019-04-04 – 2019-04-10 (×5): 6 via TOPICAL

## 2019-04-04 MED ORDER — SODIUM CHLORIDE 0.9 % IV SOLN
2.0000 g | Freq: Once | INTRAVENOUS | Status: AC
  Administered 2019-04-04: 2 g via INTRAVENOUS
  Filled 2019-04-04: qty 2

## 2019-04-04 MED ORDER — ORAL CARE MOUTH RINSE
15.0000 mL | Freq: Two times a day (BID) | OROMUCOSAL | Status: DC
  Administered 2019-04-04 – 2019-04-16 (×23): 15 mL via OROMUCOSAL

## 2019-04-04 MED ORDER — ONDANSETRON HCL 4 MG/2ML IJ SOLN
4.0000 mg | Freq: Once | INTRAMUSCULAR | Status: AC
  Administered 2019-04-04: 10:00:00 4 mg via INTRAVENOUS
  Filled 2019-04-04: qty 2

## 2019-04-04 MED ORDER — INSULIN REGULAR(HUMAN) IN NACL 100-0.9 UT/100ML-% IV SOLN
INTRAVENOUS | Status: DC
  Administered 2019-04-04: 6.1 [IU]/h via INTRAVENOUS

## 2019-04-04 MED ORDER — VANCOMYCIN HCL 10 G IV SOLR
2000.0000 mg | INTRAVENOUS | Status: AC
  Administered 2019-04-04: 15:00:00 2000 mg via INTRAVENOUS
  Filled 2019-04-04: qty 2000

## 2019-04-04 MED ORDER — SODIUM CHLORIDE 0.9 % IV BOLUS
1000.0000 mL | Freq: Once | INTRAVENOUS | Status: AC
  Administered 2019-04-04: 10:00:00 1000 mL via INTRAVENOUS

## 2019-04-04 MED ORDER — SODIUM CHLORIDE 0.9 % IV SOLN
50.0000 ug/h | INTRAVENOUS | Status: DC
  Administered 2019-04-04 – 2019-04-05 (×3): 50 ug/h via INTRAVENOUS
  Filled 2019-04-04 (×4): qty 1

## 2019-04-04 MED ORDER — SODIUM CHLORIDE 0.9 % IV SOLN
INTRAVENOUS | Status: DC
  Administered 2019-04-05: 07:00:00 via INTRAVENOUS

## 2019-04-04 MED ORDER — SODIUM CHLORIDE 0.9 % IV SOLN
2.0000 g | Freq: Two times a day (BID) | INTRAVENOUS | Status: DC
  Administered 2019-04-05 – 2019-04-06 (×2): 2 g via INTRAVENOUS
  Filled 2019-04-04 (×2): qty 2

## 2019-04-04 MED ORDER — POTASSIUM CHLORIDE 2 MEQ/ML IV SOLN
INTRAVENOUS | Status: DC
  Administered 2019-04-04 (×2): via INTRAVENOUS
  Filled 2019-04-04 (×3): qty 1000

## 2019-04-04 MED ORDER — OCTREOTIDE LOAD VIA INFUSION
50.0000 ug | Freq: Once | INTRAVENOUS | Status: AC
  Administered 2019-04-04: 50 ug via INTRAVENOUS
  Filled 2019-04-04: qty 25

## 2019-04-04 MED ORDER — ONDANSETRON HCL 4 MG/2ML IJ SOLN
4.0000 mg | Freq: Four times a day (QID) | INTRAMUSCULAR | Status: DC | PRN
  Administered 2019-04-04 – 2019-04-14 (×5): 4 mg via INTRAVENOUS
  Filled 2019-04-04 (×6): qty 2

## 2019-04-04 MED ORDER — DEXTROSE-NACL 5-0.45 % IV SOLN
INTRAVENOUS | Status: DC
  Administered 2019-04-04: 15:00:00 via INTRAVENOUS

## 2019-04-04 MED ORDER — LORAZEPAM 2 MG/ML IJ SOLN
1.0000 mg | INTRAMUSCULAR | Status: AC | PRN
  Administered 2019-04-04: 2 mg via INTRAVENOUS
  Filled 2019-04-04: qty 1

## 2019-04-04 MED ORDER — TRAZODONE HCL 50 MG PO TABS
25.0000 mg | ORAL_TABLET | Freq: Every day | ORAL | Status: DC
  Administered 2019-04-05 – 2019-04-12 (×8): 25 mg via ORAL
  Filled 2019-04-04 (×9): qty 1

## 2019-04-04 MED ORDER — FOLIC ACID 1 MG PO TABS
1.0000 mg | ORAL_TABLET | Freq: Every day | ORAL | Status: DC
  Administered 2019-04-07 – 2019-04-16 (×10): 1 mg via ORAL
  Filled 2019-04-04 (×11): qty 1

## 2019-04-04 MED ORDER — PANTOPRAZOLE SODIUM 40 MG IV SOLR
40.0000 mg | Freq: Once | INTRAVENOUS | Status: AC
  Administered 2019-04-04: 40 mg via INTRAVENOUS
  Filled 2019-04-04: qty 40

## 2019-04-04 MED ORDER — VITAMIN B-1 100 MG PO TABS
100.0000 mg | ORAL_TABLET | Freq: Every day | ORAL | Status: DC
  Administered 2019-04-07 – 2019-04-16 (×10): 100 mg via ORAL
  Filled 2019-04-04 (×11): qty 1

## 2019-04-04 MED ORDER — ADULT MULTIVITAMIN W/MINERALS CH
1.0000 | ORAL_TABLET | Freq: Every day | ORAL | Status: DC
  Administered 2019-04-07 – 2019-04-16 (×10): 1 via ORAL
  Filled 2019-04-04 (×11): qty 1

## 2019-04-04 MED ORDER — INSULIN REGULAR(HUMAN) IN NACL 100-0.9 UT/100ML-% IV SOLN
INTRAVENOUS | Status: DC
  Administered 2019-04-04: 10:00:00 3.7 [IU]/h via INTRAVENOUS
  Filled 2019-04-04: qty 100

## 2019-04-04 MED ORDER — THIAMINE HCL 100 MG/ML IJ SOLN
Freq: Once | INTRAVENOUS | Status: AC
  Administered 2019-04-04: 14:00:00 via INTRAVENOUS
  Filled 2019-04-04: qty 1000

## 2019-04-04 MED ORDER — METRONIDAZOLE IN NACL 5-0.79 MG/ML-% IV SOLN
500.0000 mg | Freq: Once | INTRAVENOUS | Status: AC
  Administered 2019-04-04: 500 mg via INTRAVENOUS
  Filled 2019-04-04: qty 100

## 2019-04-04 MED ORDER — SODIUM CHLORIDE 0.9 % IV BOLUS
1000.0000 mL | Freq: Once | INTRAVENOUS | Status: AC
  Administered 2019-04-04: 1000 mL via INTRAVENOUS

## 2019-04-04 MED ORDER — SODIUM CHLORIDE 0.9% IV SOLUTION
Freq: Once | INTRAVENOUS | Status: AC
  Administered 2019-04-04: 16:00:00 via INTRAVENOUS

## 2019-04-04 MED ORDER — SODIUM CHLORIDE 0.9 % IV SOLN: INTRAVENOUS | Status: DC

## 2019-04-04 MED ORDER — POTASSIUM CHLORIDE 10 MEQ/100ML IV SOLN
10.0000 meq | INTRAVENOUS | Status: AC
  Administered 2019-04-04 (×2): 10 meq via INTRAVENOUS
  Filled 2019-04-04 (×2): qty 100

## 2019-04-04 MED ORDER — VANCOMYCIN HCL IN DEXTROSE 1-5 GM/200ML-% IV SOLN
1000.0000 mg | INTRAVENOUS | Status: DC
  Administered 2019-04-05: 1000 mg via INTRAVENOUS
  Filled 2019-04-04: qty 200

## 2019-04-04 MED ORDER — THIAMINE HCL 100 MG/ML IJ SOLN
100.0000 mg | Freq: Every day | INTRAMUSCULAR | Status: DC
  Administered 2019-04-04 – 2019-04-06 (×3): 100 mg via INTRAVENOUS
  Filled 2019-04-04 (×5): qty 2

## 2019-04-04 MED ORDER — PANTOPRAZOLE SODIUM 40 MG IV SOLR
40.0000 mg | Freq: Two times a day (BID) | INTRAVENOUS | Status: DC
  Administered 2019-04-04 – 2019-04-05 (×3): 40 mg via INTRAVENOUS
  Filled 2019-04-04 (×3): qty 40

## 2019-04-04 MED ORDER — LORAZEPAM 1 MG PO TABS: 1.0000 mg | ORAL_TABLET | ORAL | Status: AC | PRN

## 2019-04-04 MED ORDER — METRONIDAZOLE IN NACL 5-0.79 MG/ML-% IV SOLN
500.0000 mg | Freq: Three times a day (TID) | INTRAVENOUS | Status: DC
  Administered 2019-04-04 – 2019-04-06 (×5): 500 mg via INTRAVENOUS
  Filled 2019-04-04 (×5): qty 100

## 2019-04-04 MED ORDER — VANCOMYCIN HCL IN DEXTROSE 1-5 GM/200ML-% IV SOLN
1000.0000 mg | Freq: Once | INTRAVENOUS | Status: DC
  Filled 2019-04-04: qty 200

## 2019-04-04 MED ORDER — SODIUM CHLORIDE 0.9 % IV SOLN: 10.0000 mL/h | Freq: Once | INTRAVENOUS | Status: DC

## 2019-04-04 NOTE — Progress Notes (Signed)
Pharmacy Antibiotic Note  Connor Vaughn is a 58 y.o. male with hx DM and alcoholic liver cirrhosis presented to the ED on 04/04/2019 with c/o n/v, generalized weakness, decreased appetite and fever/chills.  He was found to be in DKA and found to have low hgb with suspicion for upper GIB.  Pharmacy is consulted to start vancomycin and cefepime for suspected sepsis.  - scr down 1.79 (crcl~50) with hydration  Plan: - cefepime 2gm IV q12h - vancomycin 2000 mg IV x1, then 1000 mg IV q24h for est AUC 559  - monitor renal function closely  __________________________________________  Temp (24hrs), Avg:95.7 F (35.4 C), Min:93 F (33.9 C), Max:98.4 F (36.9 C)  Recent Labs  Lab 04/04/19 0948 04/04/19 1221 04/04/19 1340  WBC 35.9*  --  32.7*  CREATININE 2.12*  --   --   LATICACIDVEN  --  >11.0*  --     CrCl cannot be calculated (Unknown ideal weight.).    Allergies  Allergen Reactions  . Latex Rash     Thank you for allowing pharmacy to be a part of this patient's care.  Lynelle Doctor 04/04/2019 2:24 PM

## 2019-04-04 NOTE — ED Provider Notes (Signed)
[facility] DEPT Provider Note   CSN: [id number] Arrival date & time: 04/04/19  9509     History   Chief Complaint No chief complaint on file.   HPI Connor Vaughn is a 58 y.o. male.     The history is provided by the patient and medical records. No language interpreter was used.     58 year old male with history of diabetes, alcoholic liver cirrhosis brought here via EMS from home for evaluation of high blood sugar.  Patient report for the past 4 to 5 days he endorsed generalized weakness, subjective fever, chills, nausea, vomiting, diarrhea, decrease in appetite and having multiple falls from his weakness.  Symptom is moderate in severity.  He admits that he has not been taking his diabetic medication for 5 to 6 months due to inability to afford it.  He admits to drinking alcohol approximately 1/5 a day but states last alcohol consumption was approximately a week ago.  He denies any tobacco use or other drug use.  He denies any recent sick contact.  Denies having runny nose sneezing or coughing or any COVID-19 exposure.  Denies having significant chest pain or abdominal pain or dysuria.  He does endorse polyuria and polydipsia.  He was on glipizide.  Past Medical History:  Diagnosis Date  . Cirrhosis ([place])   . Diabetes mellitus, type II ([place])   . H/O fracture    nasal x 2    Patient Active Problem List   Diagnosis Date Noted  . Recurrent right pleural effusion 01/10/2016  . Alcoholic cirrhosis of liver with ascites ([place])   . Pleural effusion 12/24/2015  . Pleural effusion, right 12/24/2015  . Alcohol abuse 12/24/2015  . Syncope 12/24/2015    Past Surgical History:  Procedure Laterality Date  . thoracocenteis          Home Medications    Prior to Admission medications   Medication Sig Start Date End Date Taking? Authorizing Provider  ezetimibe (ZETIA) 10 MG tablet Take 10 mg by mouth daily. 05/30/17   [provider]   furosemide (LASIX) 40 MG tablet Take 2 tablets (80 mg total) by mouth daily. 01/11/16   Lavina Hamman, MD  glipiZIDE (GLUCOTROL) 5 MG tablet Take 5 mg by mouth daily before breakfast.  05/09/17   [provider]  hydrOXYzine (VISTARIL) 25 MG capsule Take 25 mg by mouth 4 (four) times daily.  05/30/17   [provider]  LORazepam (ATIVAN) 1 MG tablet Take 1 mg by mouth daily.  06/20/17   [provider]  methocarbamol (ROBAXIN) 500 MG tablet Take 1 tablet (500 mg total) by mouth every 8 (eight) hours as needed for muscle spasms. 05/12/18   Hayden Rasmussen, MD  mometasone (NASONEX) 50 MCG/ACT nasal spray Place 2 sprays into the nose daily as needed (congestion).  06/03/17   [provider]  spironolactone (ALDACTONE) 100 MG tablet Take 2 tablets (200 mg total) by mouth daily. 01/11/16   Lavina Hamman, MD    Family History Family History  Problem Relation Age of Onset  . Diabetes Mother   . Heart failure Mother   . Valvular heart disease Mother   . Diabetes Mellitus II Father   . Heart failure Father   . Dementia Father   . Seizures Neg Hx     Social History Social History   Tobacco Use  . Smoking status: Former Smoker    Types: Cigarettes    Quit date:  2010    Years since quitting: 10.8  . Smokeless tobacco: Never Used  Substance Use Topics  . Alcohol use: Yes    Comment: "pint every few days"  . Drug use: No     Allergies   Latex   Review of Systems Review of Systems  All other systems reviewed and are negative.    Physical Exam Updated Vital Signs BP (!) 117/45   Pulse (!) 126   Temp 98.4 F (36.9 C) (Oral)   Resp 20   SpO2 100%   Physical Exam Vitals signs and nursing note reviewed.  Constitutional:      General: He is not in acute distress.    Appearance: He is well-developed. He is ill-appearing.  HENT:     Head: Atraumatic.     Mouth/Throat:     Mouth: Mucous membranes are dry.     Comments: Mouth is dry Eyes:      Conjunctiva/sclera: Conjunctivae normal.  Neck:     Musculoskeletal: Neck supple.  Cardiovascular:     Rate and Rhythm: Tachycardia present.  Pulmonary:     Effort: Pulmonary effort is normal.     Breath sounds: Normal breath sounds.  Abdominal:     Palpations: Abdomen is soft.     Tenderness: There is no abdominal tenderness.  Genitourinary:    Comments: Chaperone present during exam.  Normal rectal tone, black tarry stool noted on glove, no obvious mass, no rectal pain. Musculoskeletal: Normal range of motion.  Skin:    General: Skin is dry.     Findings: No rash.  Neurological:     Mental Status: He is alert and oriented to person, place, and time.  Psychiatric:        Mood and Affect: Mood normal.      ED Treatments / Results  Labs (all labs ordered are listed, but only abnormal results are displayed) Labs Reviewed  CBC WITH DIFFERENTIAL/PLATELET - Abnormal; Notable for the following components:      Result Value   WBC 35.9 (*)    RBC 2.27 (*)    Hemoglobin 7.0 (*)    HCT 22.1 (*)    Neutro Abs 27.0 (*)    Monocytes Absolute 4.2 (*)    Abs Immature Granulocytes 0.99 (*)    All other components within normal limits  COMPREHENSIVE METABOLIC PANEL - Abnormal; Notable for the following components:   Sodium 131 (*)    Chloride 89 (*)    CO2 9 (*)    Glucose, Bld 424 (*)    BUN 52 (*)    Creatinine, Ser 2.12 (*)    Calcium 8.2 (*)    Total Protein 5.1 (*)    Albumin 3.2 (*)    AST 426 (*)    ALT 286 (*)    Total Bilirubin 2.4 (*)    GFR calc non Af Amer 34 (*)    GFR calc Af Amer 39 (*)    Anion gap 33 (*)    All other components within normal limits  LIPASE, BLOOD - Abnormal; Notable for the following components:   Lipase 162 (*)    All other components within normal limits  URINALYSIS, ROUTINE W REFLEX MICROSCOPIC - Abnormal; Notable for the following components:   Glucose, UA >=500 (*)    Ketones, ur 5 (*)    Bacteria, UA RARE (*)    All other  components within normal limits  BLOOD GAS, VENOUS - Abnormal; Notable for the following components:  pH, Ven 7.229 (*)    pCO2, Ven 23.5 (*)    Bicarbonate 9.5 (*)    Acid-base deficit 16.6 (*)    All other components within normal limits  PROTIME-INR - Abnormal; Notable for the following components:   Prothrombin Time 25.0 (*)    INR 2.3 (*)    All other components within normal limits  CBG MONITORING, ED - Abnormal; Notable for the following components:   Glucose-Capillary 431 (*)    All other components within normal limits  CBG MONITORING, ED - Abnormal; Notable for the following components:   Glucose-Capillary 325 (*)    All other components within normal limits  POC OCCULT BLOOD, ED - Abnormal; Notable for the following components:   Fecal Occult Bld POSITIVE (*)    All other components within normal limits  CBG MONITORING, ED - Abnormal; Notable for the following components:   Glucose-Capillary 304 (*)    All other components within normal limits  SARS CORONAVIRUS 2 (TAT 6-24 HRS)  CULTURE, BLOOD (ROUTINE X 2)  CULTURE, BLOOD (ROUTINE X 2)  URINE CULTURE  APTT  LACTIC ACID, PLASMA  LACTIC ACID, PLASMA  ETHANOL  BETA-HYDROXYBUTYRIC ACID  HEMOGLOBIN A1C  TYPE AND SCREEN  PREPARE RBC (CROSSMATCH)    EKG EKG Interpretation  Date/Time:  Thursday April 04 2019 12:14:47 EST Ventricular Rate:  127 PR Interval:    QRS Duration: 94 QT Interval:  324 QTC Calculation: 471 R Axis:   -46 Text Interpretation: Sinus tachycardia Left axis deviation Abnormal inferior Q waves Consider posterior infarct Confirmed by Lorre Nick ([id number]) on 04/04/2019 12:51:18 PM   ED ECG REPORT   Date: 04/04/2019  Rate: 127  Rhythm: sinus tachycardia  QRS Axis: left  Intervals: normal  ST/T Wave abnormalities: nonspecific ST changes  Conduction Disutrbances:none  Narrative Interpretation:   Old EKG Reviewed: changes noted  I have personally reviewed the EKG tracing and agree  with the computerized printout as noted.   Radiology No results found.  Procedures .Critical Care Performed by: Fayrene Helper, PA-C Authorized by: Fayrene Helper, PA-C   Critical care provider statement:    Critical care time (minutes):  50   Critical care was time spent personally by me on the following activities:  Discussions with consultants, evaluation of patient's response to treatment, examination of patient, ordering and performing treatments and interventions, ordering and review of laboratory studies, ordering and review of radiographic studies, pulse oximetry, re-evaluation of patient's condition, obtaining history from patient or surrogate and review of old charts   (including critical care time)  Medications Ordered in ED Medications  dextrose 5 %-0.45 % sodium chloride infusion (has no administration in time range)  insulin regular, human (MYXREDLIN) 100 units/ 100 mL infusion (3.7 Units/hr Intravenous New Bag/Given 04/04/19 1020)  sodium chloride 0.9 % bolus 1,000 mL (0 mLs Intravenous Stopped 04/04/19 1231)    And  sodium chloride 0.9 % bolus 1,000 mL (1,000 mLs Intravenous New Bag/Given 04/04/19 1015)    And  sodium chloride 0.9 % bolus 1,000 mL (1,000 mLs Intravenous New Bag/Given 04/04/19 1014)    And  0.9 %  sodium chloride infusion (has no administration in time range)  ceFEPIme (MAXIPIME) 2 g in sodium chloride 0.9 % 100 mL IVPB (2 g Intravenous New Bag/Given 04/04/19 1238)  metroNIDAZOLE (FLAGYL) IVPB 500 mg (has no administration in time range)  vancomycin (VANCOCIN) IVPB 1000 mg/200 mL premix (has no administration in time range)  0.9 %  sodium chloride infusion (has no  administration in time range)  ondansetron (ZOFRAN) injection 4 mg (4 mg Intravenous Given 04/04/19 0945)  pantoprazole (PROTONIX) injection 40 mg (40 mg Intravenous Given 04/04/19 1206)     Initial Impression / Assessment and Plan / ED Course  I have reviewed the triage vital signs and the  nursing notes.  Pertinent labs & imaging results that were available during my care of the patient were reviewed by me and considered in my medical decision making (see chart for details).        BP (!) 117/45   Pulse (!) 126   Temp 98.4 F (36.9 C) (Oral)   Resp 20   SpO2 100%    Final Clinical Impressions(s) / ED Diagnoses   Final diagnoses:  Diabetic ketoacidosis without coma associated with type 2 diabetes mellitus (HCC)  Symptomatic anemia  Upper GI bleed  Transaminitis  Sepsis with acute renal failure without septic shock, due to unspecified organism, unspecified acute renal failure type [facility])    ED Discharge Orders    None     9:29 AM Patient here with nausea vomiting diarrhea polyuria and polydipsia and an elevated CBG greater than 500 concerning for potential DKA.  He has been noncompliant with his diabetic medication for the past several months.  He also admits to alcohol abuse.  He does not exhibit any COVID-19 symptoms.  Work-up initiated.  Anticipate hospital admission for potential DKA.  Will initiate glucose stabilizer protocol.  11:40 AM Initial oral temperature was at 94%, rectal temp is 98.4.  Patient however is tachycardic with a heart rate in the 130s, and tachypneic with respiratory rate of 24.  Blood pressure is elevated at 160/77.  Lab is remarkable for a high and leukocytosis with WBC 35.9.  This could be secondary to upper GI bleed.  Evidence of a significant drop in his hemoglobin of 7.  CBG is 431, with a venous pH of 7.22 suggestive of metabolic acidosis likely secondary to DKA.  But due to potential underlying infectious etiology causing his predicament, code sepsis initiated.  Broad-spectrum antibiotic given.  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  Furthermore, patient developed DKA.  At this time, he is receiving Protonix for  his GI bleed.  Will consult GI specialist.  He is also on glucose stabilizer to help with his DKA with an anion gap of 33.  He also has evidence of transaminitis with AST 426, ALT 286 and a total bili of 2.4 likely secondary to alcoholic liver disease.  Elevated lipase of 162.  Abdominal exam is fairly unremarkable however will consider abdominal pelvis CT scan without contrast due to his evidence of AKI with a creatinine of 2.12.  Portable chest x-ray ordered.  12:54 PM Appreciate consultation from [facility] who agrees to see and admit pt.  I have also consulted GI specialist Dr. Elnoria Howard who agrees to be involve in pt care.  He does not have any specific recommendation at this time.  Will continue to monitor pt closely.  Pt given abx, IVF, and insulin.  Pt will need to be admitted to step down.  Abd/pelvis CT not order at this time as we are working to stabilize this patient.  Care discussed with Dr. Freida Busman.    Fayrene Helper, PA-C 04/04/19 1255    Lorre Nick, MD 04/08/19 [phone]

## 2019-04-04 NOTE — Progress Notes (Signed)
Patients Aunt Mirian Capuchin called this RN while downstairs. Charge RN said RN would call back when returned to unit. After asking patient about his Aunt, he asked me to not call her back as he has not talked to her in years. RN will not call patients aunt back at this time.

## 2019-04-04 NOTE — Procedures (Signed)
Central Venous Catheter Insertion Procedure Note Connor Vaughn [id number] Jan 02, 1961  Procedure: Insertion of Central Venous Catheter Indications: Assessment of intravascular volume, Drug and/or fluid administration and Frequent blood sampling  Procedure Details Consent: Risks of procedure as well as the alternatives and risks of each were explained to the (patient/caregiver).  Consent for procedure obtained. Time Out: Verified patient identification, verified procedure, site/side was marked, verified correct patient position, special equipment/implants available, medications/allergies/relevent history reviewed, required imaging and test results available.  Performed Real time [place] used to ID and cannulate vessel  Maximum sterile technique was used including antiseptics, cap, gloves, gown, hand hygiene, mask and sheet. Skin prep: Chlorhexidine; local anesthetic administered A antimicrobial bonded/coated triple lumen catheter was placed in the right internal jugular vein using the Seldinger technique.  Evaluation Blood flow good Complications: No apparent complications Patient did tolerate procedure well. Chest X-ray ordered to verify placement.  CXR: pending.  Clementeen Graham 04/04/2019, 3:50 PM  Erick Colace ACNP-BC [facility] Pager # [phone] OR # [phone] if no answer

## 2019-04-04 NOTE — ED Notes (Signed)
ED TO INPATIENT HANDOFF REPORT  Name/Age/Gender Connor Vaughn 58 y.o. male  Code Status Code Status History    Date Active Date Inactive Code Status Order ID Comments User Context   01/10/2016 1601 01/11/2016 2015 Full Code [id number]  Marinda Elk, MD Inpatient   12/24/2015 1501 12/26/2015 1954 Full Code [id number]  Maretta Bees, MD Inpatient   Advance Care Planning Activity      Home/SNF/Other Home  Chief Complaint hyperglycemia nausea vomiting  Level of Care/Admitting Diagnosis ED Disposition    ED Disposition Condition Comment   Admit  Hospital Area: [facility] [[id number]]  Level of Care: Stepdown [14]  Admit to SDU based on following criteria: Hemodynamic compromise or significant risk of instability:  Patient requiring short term acute titration and management of vasoactive drips, and invasive monitoring (i.e., CVP and Arterial line).  Admit to SDU based on following criteria: Severe physiological/psychological symptoms:  Any diagnosis requiring assessment & intervention at least every 4 hours on an ongoing basis to obtain desired patient outcomes including stability and rehabilitation  Covid Evaluation: Asymptomatic Screening Protocol (No Symptoms)  Diagnosis: DKA (diabetic ketoacidoses) [facility]) [[id number]]  Admitting Physician: Clydia Llano [[id number]]  Attending Physician: Clydia Llano [[id number]]  Estimated length of stay: 3 - 4 days  Certification:: I certify this patient will need inpatient services for at least 2 midnights  PT Class (Do Not Modify): Inpatient [101]  PT Acc Code (Do Not Modify): Private [1]       Medical History Past Medical History:  Diagnosis Date  . Cirrhosis (HCC)   . Diabetes mellitus, type II (HCC)   . DKA (diabetic ketoacidoses) (HCC) 04/04/2019  . H/O fracture    nasal x 2    Allergies Allergies  Allergen Reactions  . Latex Rash    IV Location/Drains/Wounds Patient Lines/Drains/Airways Status    Active Line/Drains/Airways    Name:   Placement date:   Placement time:   Site:   Days:   Peripheral IV 04/04/19 Right Antecubital   04/04/19    -    Antecubital   less than 1   Peripheral IV 04/04/19 Left Antecubital   04/04/19    1006    Antecubital   less than 1   Peripheral IV 04/04/19 Right Wrist   04/04/19    1238    Wrist   less than 1          Labs/Imaging Results for orders placed or performed during the hospital encounter of 04/04/19 (from the past 48 hour(s))  Urinalysis, Routine w reflex microscopic     Status: Abnormal   Collection Time: 04/04/19  9:11 AM  Result Value Ref Range   Color, Urine YELLOW YELLOW   APPearance CLEAR CLEAR   Specific Gravity, Urine 1.017 1.005 - 1.030   pH 5.0 5.0 - 8.0   Glucose, UA >=500 (A) NEGATIVE mg/dL   Hgb urine dipstick NEGATIVE NEGATIVE   Bilirubin Urine NEGATIVE NEGATIVE   Ketones, ur 5 (A) NEGATIVE mg/dL   Protein, ur NEGATIVE NEGATIVE mg/dL   Nitrite NEGATIVE NEGATIVE   Leukocytes,Ua NEGATIVE NEGATIVE   RBC / HPF 0-5 0 - 5 RBC/hpf   WBC, UA 0-5 0 - 5 WBC/hpf   Bacteria, UA RARE (A) NONE SEEN   Mucus PRESENT    Hyaline Casts, UA PRESENT     Comment: Performed at [facility], [address], [place], [place] [address]  CBC with Differential     Status: Abnormal  Collection Time: 04/04/19  9:48 AM  Result Value Ref Range   WBC 35.9 (H) 4.0 - 10.5 K/uL   RBC 2.27 (L) 4.22 - 5.81 MIL/uL   Hemoglobin 7.0 (L) 13.0 - 17.0 g/dL   HCT 75.1 (L) 70.0 - 17.4 %   MCV 97.4 80.0 - 100.0 fL   MCH 30.8 26.0 - 34.0 pg   MCHC 31.7 30.0 - 36.0 g/dL   RDW 94.4 96.7 - 59.1 %   Platelets 233 150 - 400 K/uL   nRBC 0.2 0.0 - 0.2 %   Neutrophils Relative % 75 %   Neutro Abs 27.0 (H) 1.7 - 7.7 K/uL   Lymphocytes Relative 10 %   Lymphs Abs 3.6 0.7 - 4.0 K/uL   Monocytes Relative 12 %   Monocytes Absolute 4.2 (H) 0.1 - 1.0 K/uL   Eosinophils Relative 0 %   Eosinophils Absolute 0.0 0.0 - 0.5 K/uL   Basophils Relative 0 %    Basophils Absolute 0.1 0.0 - 0.1 K/uL   Immature Granulocytes 3 %   Abs Immature Granulocytes 0.99 (H) 0.00 - 0.07 K/uL    Comment: Performed at [facility], [address] [place], [place] [address]  Comprehensive metabolic panel     Status: Abnormal   Collection Time: 04/04/19  9:48 AM  Result Value Ref Range   Sodium 131 (L) 135 - 145 mmol/L    Comment: REPEATED TO VERIFY   Potassium 4.0 3.5 - 5.1 mmol/L   Chloride 89 (L) 98 - 111 mmol/L    Comment: REPEATED TO VERIFY   CO2 9 (L) 22 - 32 mmol/L    Comment: REPEATED TO VERIFY   Glucose, Bld 424 (H) 70 - 99 mg/dL   BUN 52 (H) 6 - 20 mg/dL   Creatinine, Ser 6.59 (H) 0.61 - 1.24 mg/dL   Calcium 8.2 (L) 8.9 - 10.3 mg/dL   Total Protein 5.1 (L) 6.5 - 8.1 g/dL   Albumin 3.2 (L) 3.5 - 5.0 g/dL   AST 935 (H) 15 - 41 U/L   ALT 286 (H) 0 - 44 U/L    Comment: RESULTS CONFIRMED BY MANUAL DILUTION   Alkaline Phosphatase 59 38 - 126 U/L   Total Bilirubin 2.4 (H) 0.3 - 1.2 mg/dL   GFR calc non Af Amer 34 (L) >60 mL/min   GFR calc Af Amer 39 (L) >60 mL/min   Anion gap 33 (H) 5 - 15    Comment: REPEATED TO VERIFY Performed at [facility], [address] [place], [place] [address]   Lipase, blood     Status: Abnormal   Collection Time: 04/04/19  9:48 AM  Result Value Ref Range   Lipase 162 (H) 11 - 51 U/L    Comment: Performed at [facility], [address], [place], [place] [address]  Blood gas, venous (at [facility] and [facility], not at [facility])     Status: Abnormal   Collection Time: 04/04/19  9:48 AM  Result Value Ref Range   pH, Ven 7.229 (L) 7.250 - 7.430   pCO2, Ven 23.5 (L) 44.0 - 60.0 mmHg   pO2, Ven 41.3 32.0 - 45.0 mmHg   Bicarbonate 9.5 (L) 20.0 - 28.0 mmol/L   Acid-base deficit 16.6 (H) 0.0 - 2.0 mmol/L   O2 Saturation 56.1 %   Patient temperature 98.6     Comment: Performed at [facility], [address] [place], [place] [address]  APTT  Status: None    Collection Time: 04/04/19  9:48 AM  Result Value Ref Range   aPTT 28 24 - 36 seconds    Comment: Performed at [facility], [place] [address], [place], [place] [address]  Protime-INR     Status: Abnormal   Collection Time: 04/04/19  9:48 AM  Result Value Ref Range   Prothrombin Time 25.0 (H) 11.4 - 15.2 seconds   INR 2.3 (H) 0.8 - 1.2    Comment: (NOTE) INR goal varies based on device and disease states. Performed at [facility], [place] [address] [place], [place] [address]   CBG monitoring, ED     Status: Abnormal   Collection Time: 04/04/19 10:00 AM  Result Value Ref Range   Glucose-Capillary 431 (H) 70 - 99 mg/dL  CBG monitoring, ED     Status: Abnormal   Collection Time: 04/04/19 11:18 AM  Result Value Ref Range   Glucose-Capillary 325 (H) 70 - 99 mg/dL  POC occult blood, ED RN will collect     Status: Abnormal   Collection Time: 04/04/19 11:55 AM  Result Value Ref Range   Fecal Occult Bld POSITIVE (A) NEGATIVE  Type and screen     Status: None (Preliminary result)   Collection Time: 04/04/19 12:18 PM  Result Value Ref Range   ABO/RH(D) O POS    Antibody Screen NEG    Sample Expiration      04/07/2019,2359 Performed at [facility], [place] [address] [place], [place] [address]    Unit Number [id number]    Blood Component Type RED CELLS,LR    Unit division 00    Status of Unit ALLOCATED    Transfusion Status OK TO TRANSFUSE    Crossmatch Result Compatible    Unit Number [id number]    Blood Component Type RED CELLS,LR    Unit division 00    Status of Unit ALLOCATED    Transfusion Status OK TO TRANSFUSE    Crossmatch Result Compatible   Lactic acid, plasma     Status: Abnormal   Collection Time: 04/04/19 12:21 PM  Result Value Ref Range   Lactic Acid, Venous >11.0 (HH) 0.5 - 1.9 mmol/L    Comment: CRITICAL RESULT CALLED TO, READ BACK BY AND VERIFIED WITH: LEONARD,RN ON 04/04/19 @ 1301 BY LE Performed at  [facility], [place] [address], [place], [place] [address]   Prepare RBC     Status: None   Collection Time: 04/04/19 12:21 PM  Result Value Ref Range   Order Confirmation      ORDER PROCESSED BY BLOOD BANK Performed at [place] [address], [place],  [address]   ABO/Rh     Status: None (Preliminary result)   Collection Time: 04/04/19 12:21 PM  Result Value Ref Range   ABO/RH(D)      Jenetta Downer POS Performed at [facility], [place] [address] [place],  [address]   CBG monitoring, ED     Status: Abnormal   Collection Time: 04/04/19 12:24 PM  Result Value Ref Range   Glucose-Capillary 304 (H) 70 - 99 mg/dL  CBG monitoring, ED     Status: Abnormal   Collection Time: 04/04/19  1:30 PM  Result Value Ref Range   Glucose-Capillary 261 (H) 70 - 99 mg/dL   Dg Chest Port 1 View  Result Date: 04/04/2019 CLINICAL DATA:  Sepsis. EXAM: PORTABLE CHEST 1 VIEW COMPARISON:  05/12/2018. FINDINGS: Mediastinum and hilar  structures normal. Lungs are clear. No pleural effusion or pneumothorax. Heart size normal. No acute bony abnormality. IMPRESSION: 1.  Cardiomegaly.  No pulmonary venous congestion. 2.  No focal infiltrate. Electronically Signed   By: Maisie Fus  Register   On: 04/04/2019 12:59    Pending Labs Unresulted Labs (From admission, onward)    Start     Ordered   04/04/19 1340  Comprehensive metabolic panel  Once,   STAT     04/04/19 1340   04/04/19 1340  Magnesium  Once,   STAT     04/04/19 1340   04/04/19 1340  Phosphorus  Once,   STAT     04/04/19 1340   04/04/19 1340  CBC  Once,   STAT     04/04/19 1340   04/04/19 1336  MRSA PCR Screening  Once,   STAT    Question:  Patient immune status  Answer:  Normal   04/04/19 1336   04/04/19 1333  Culture, blood (single)  ONCE - STAT,   STAT     04/04/19 1332   04/04/19 1238  Beta-hydroxybutyric acid  Once,   STAT     04/04/19 1237   04/04/19 1238  Hemoglobin A1c  Once,    STAT     04/04/19 1238   04/04/19 1237  Ethanol  Once,   STAT     04/04/19 1237   04/04/19 1139  Lactic acid, plasma  Now then every 2 hours,   STAT     04/04/19 1139   04/04/19 1139  Blood Culture (routine x 2)  BLOOD CULTURE X 2,   STAT     04/04/19 1139   04/04/19 1139  Urine culture  ONCE - STAT,   STAT     04/04/19 1139   04/04/19 0925  SARS CORONAVIRUS 2 (TAT 6-24 HRS) Nasopharyngeal Urine, Clean Catch  (Asymptomatic/Tier 2 Patients Labs)  Once,   STAT    Question Answer Comment  Is this test for diagnosis or screening Screening   Symptomatic for COVID-19 as defined by CDC No   Hospitalized for COVID-19 No   Admitted to ICU for COVID-19 No   Previously tested for COVID-19 No   Resident in a congregate (group) care setting No   Employed in healthcare setting No      04/04/19 0924   Signed and Held  HIV Antibody (routine testing w rflx)  (HIV Antibody (Routine testing w reflex) panel)  Once,   R     Signed and Held   Signed and Held  Basic metabolic panel  STAT Now then every 4 hours ,   STAT     Signed and Held   Signed and Held  Hemoglobin A1c  Once,   R    Comments: To assess prior glycemic control.    Signed and Held   Signed and Held  Procalcitonin - Baseline  ONCE - STAT,   STAT     Signed and Held   Signed and Held  Procalcitonin  Daily,   R     Signed and Held          Vitals/Pain Today's Vitals   04/04/19 1145 04/04/19 1200 04/04/19 1303 04/04/19 1319  BP:  (!) 117/45 (!) 119/50 (!) 111/99  Pulse: (!) 137 (!) 126 (!) 128 (!) 129  Resp: (!) (!) 22  Temp:      TempSrc:      SpO2: 100% 100% 100% 100%    Isolation Precautions  No active isolations  Medications Medications  dextrose 5 %-0.45 % sodium chloride infusion (has no administration in time range)  insulin regular, human (MYXREDLIN) 100 units/ 100 mL infusion (3.7 Units/hr Intravenous New Bag/Given 04/04/19 1020)  sodium chloride 0.9 % bolus 1,000 mL (0 mLs Intravenous Stopped 04/04/19  1231)    And  sodium chloride 0.9 % bolus 1,000 mL (0 mLs Intravenous Stopped 04/04/19 1331)    And  sodium chloride 0.9 % bolus 1,000 mL (1,000 mLs Intravenous New Bag/Given 04/04/19 1014)    And  0.9 %  sodium chloride infusion (has no administration in time range)  metroNIDAZOLE (FLAGYL) IVPB 500 mg (has no administration in time range)  vancomycin (VANCOCIN) IVPB 1000 mg/200 mL premix (has no administration in time range)  0.9 %  sodium chloride infusion (has no administration in time range)  metroNIDAZOLE (FLAGYL) IVPB 500 mg (has no administration in time range)  MEDLINE mouth rinse (has no administration in time range)  Chlorhexidine Gluconate Cloth 2 % PADS 6 each (has no administration in time range)  sodium chloride 0.9 % 1,000 mL with thiamine 100 mg, folic acid 1 mg, multivitamins adult 10 mL infusion (has no administration in time range)  LORazepam (ATIVAN) tablet 1-4 mg (has no administration in time range)    Or  LORazepam (ATIVAN) injection 1-4 mg (has no administration in time range)  thiamine (VITAMIN B-1) tablet 100 mg (has no administration in time range)    Or  thiamine (B-1) injection 100 mg (has no administration in time range)  folic acid (FOLVITE) tablet 1 mg (has no administration in time range)  multivitamin with minerals tablet 1 tablet (has no administration in time range)  ondansetron (ZOFRAN) injection 4 mg (4 mg Intravenous Given 04/04/19 0945)  ceFEPIme (MAXIPIME) 2 g in sodium chloride 0.9 % 100 mL IVPB (2 g Intravenous New Bag/Given 04/04/19 1238)  pantoprazole (PROTONIX) injection 40 mg (40 mg Intravenous Given 04/04/19 1206)    Mobility non-ambulatory

## 2019-04-04 NOTE — Sepsis Progress Note (Cosign Needed)
Notified bedside nurse of need to draw repeat lactic acid. 

## 2019-04-04 NOTE — Progress Notes (Signed)
Spoke with bedside RN and Marni Griffon NP about the status of this patient's Code Sepsis. Per CMS guidelines, another repeat lactic acid should be drawn to ensure LA level is responding to treatments. This patient is currently receiving blood products. However, Pete NP stated to not get any further LA. Suggested bedside staff evaluate cancelling Code Sepsis.

## 2019-04-04 NOTE — Consult Note (Signed)
Reason for Consult: Upper GI bleed, history of ETOH/HCV cirrhosis Referring Physician: [facility]  Connor Vaughn HPI: This is a 58 year old male with a PMH of HCV and ETOH cirrhosis.  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  The last office visit in the office was in 2018 and he was treated with Mavyret for his HCV genotype 2b.  The patient also remained sober and he was progressing clinically, but he had financial issues and he was not able to afford medicine and medical care.  The intention was to perform a screening EGD, but it was not a pressing procedure at that time.  The patient presents with complaints of weakness and he has suffered with several falls.  The blood work showed that he has DKA and he also restarted drinking ETOH one year ago.  He does report that he drank heavily.  Per his report, his last drink was one week ago.  His HGB was noted to be at 5.6 g/dL and reports melena, which is confirmed to be heme positive.  The melena started this past Sunday.  Past Medical History:  Diagnosis Date  . Cirrhosis (HCC)   . Diabetes mellitus, type II (HCC)   . DKA (diabetic ketoacidoses) (HCC) 04/04/2019  . H/O fracture    nasal x 2    Past Surgical History:  Procedure Laterality Date  . thoracocenteis      Family History  Problem Relation Age of Onset  . Diabetes Mother   . Heart failure Mother   . Valvular heart disease Mother   . Diabetes Mellitus II Father   . Heart failure Father   . Dementia Father   . Seizures Neg Hx     Social History:  reports that he quit smoking about 10 years ago. His smoking use included cigarettes. He has never used smokeless tobacco. He reports current alcohol use. He reports that he does not use drugs.  Allergies:  Allergies  Allergen Reactions  . Latex Rash    Medications:  Scheduled: . Chlorhexidine Gluconate Cloth  6 each Topical Daily  . folic acid  1 mg Oral Daily  . mouth rinse  15 mL  Mouth Rinse BID  . multivitamin with minerals  1 tablet Oral Daily  . octreotide  50 mcg Intravenous Once  . pantoprazole (PROTONIX) IV  40 mg Intravenous Q12H  . thiamine  100 mg Oral Daily   Or  . thiamine  100 mg Intravenous Daily   Continuous: . sodium chloride    . sodium chloride    . sodium chloride    . dextrose 5 % and 0.45% NaCl    . dextrose 5 % and 0.45% NaCl    . insulin    . lactated ringers with kcl    . metronidazole 500 mg (04/04/19 1353)  . metronidazole    . octreotide  (SANDOSTATIN)    IV infusion    . potassium chloride    . vancomycin      Results for orders placed or performed during the hospital encounter of 04/04/19 (from the past 24 hour(s))  Urinalysis, Routine w reflex microscopic     Status: Abnormal   Collection Time: 04/04/19  9:11 AM  Result Value Ref Range   Color, Urine YELLOW YELLOW   APPearance CLEAR CLEAR   Specific Gravity, Urine 1.017 1.005 - 1.030   pH 5.0 5.0 - 8.0   Glucose, UA >=500 (A) NEGATIVE mg/dL  Hgb urine dipstick NEGATIVE NEGATIVE   Bilirubin Urine NEGATIVE NEGATIVE   Ketones, ur 5 (A) NEGATIVE mg/dL   Protein, ur NEGATIVE NEGATIVE mg/dL   Nitrite NEGATIVE NEGATIVE   Leukocytes,Ua NEGATIVE NEGATIVE   RBC / HPF 0-5 0 - 5 RBC/hpf   WBC, UA 0-5 0 - 5 WBC/hpf   Bacteria, UA RARE (A) NONE SEEN   Mucus PRESENT    Hyaline Casts, UA PRESENT   CBC with Differential     Status: Abnormal   Collection Time: 04/04/19  9:48 AM  Result Value Ref Range   WBC 35.9 (H) 4.0 - 10.5 K/uL   RBC 2.27 (L) 4.22 - 5.81 MIL/uL   Hemoglobin 7.0 (L) 13.0 - 17.0 g/dL   HCT 22.1 (L) 39.0 - 52.0 %   MCV 97.4 80.0 - 100.0 fL   MCH 30.8 26.0 - 34.0 pg   MCHC 31.7 30.0 - 36.0 g/dL   RDW 15.2 11.5 - 15.5 %   Platelets 233 150 - 400 K/uL   nRBC 0.2 0.0 - 0.2 %   Neutrophils Relative % 75 %   Neutro Abs 27.0 (H) 1.7 - 7.7 K/uL   Lymphocytes Relative 10 %   Lymphs Abs 3.6 0.7 - 4.0 K/uL   Monocytes Relative 12 %   Monocytes Absolute 4.2 (H)  0.1 - 1.0 K/uL   Eosinophils Relative 0 %   Eosinophils Absolute 0.0 0.0 - 0.5 K/uL   Basophils Relative 0 %   Basophils Absolute 0.1 0.0 - 0.1 K/uL   Immature Granulocytes 3 %   Abs Immature Granulocytes 0.99 (H) 0.00 - 0.07 K/uL  Comprehensive metabolic panel     Status: Abnormal   Collection Time: 04/04/19  9:48 AM  Result Value Ref Range   Sodium 131 (L) 135 - 145 mmol/L   Potassium 4.0 3.5 - 5.1 mmol/L   Chloride 89 (L) 98 - 111 mmol/L   CO2 9 (L) 22 - 32 mmol/L   Glucose, Bld 424 (H) 70 - 99 mg/dL   BUN 52 (H) 6 - 20 mg/dL   Creatinine, Ser 2.12 (H) 0.61 - 1.24 mg/dL   Calcium 8.2 (L) 8.9 - 10.3 mg/dL   Total Protein 5.1 (L) 6.5 - 8.1 g/dL   Albumin 3.2 (L) 3.5 - 5.0 g/dL   AST 426 (H) 15 - 41 U/L   ALT 286 (H) 0 - 44 U/L   Alkaline Phosphatase 59 38 - 126 U/L   Total Bilirubin 2.4 (H) 0.3 - 1.2 mg/dL   GFR calc non Af Amer 34 (L) >60 mL/min   GFR calc Af Amer 39 (L) >60 mL/min   Anion gap 33 (H) 5 - 15  Lipase, blood     Status: Abnormal   Collection Time: 04/04/19  9:48 AM  Result Value Ref Range   Lipase 162 (H) 11 - 51 U/L  Blood gas, venous (at [facility] and [facility], not at [facility])     Status: Abnormal   Collection Time: 04/04/19  9:48 AM  Result Value Ref Range   pH, Ven 7.229 (L) 7.250 - 7.430   pCO2, Ven 23.5 (L) 44.0 - 60.0 mmHg   pO2, Ven 41.3 32.0 - 45.0 mmHg   Bicarbonate 9.5 (L) 20.0 - 28.0 mmol/L   Acid-base deficit 16.6 (H) 0.0 - 2.0 mmol/L   O2 Saturation 56.1 %   Patient temperature 98.6   APTT     Status: None   Collection Time: 04/04/19  9:48 AM  Result Value Ref  Range   aPTT 28 24 - 36 seconds  Protime-INR     Status: Abnormal   Collection Time: 04/04/19  9:48 AM  Result Value Ref Range   Prothrombin Time 25.0 (H) 11.4 - 15.2 seconds   INR 2.3 (H) 0.8 - 1.2  Hemoglobin A1c     Status: Abnormal   Collection Time: 04/04/19  9:48 AM  Result Value Ref Range   Hgb A1c MFr Bld 6.1 (H) 4.8 - 5.6 %   Mean Plasma Glucose 128.37 mg/dL  CBG monitoring, ED      Status: Abnormal   Collection Time: 04/04/19 10:00 AM  Result Value Ref Range   Glucose-Capillary 431 (H) 70 - 99 mg/dL  CBG monitoring, ED     Status: Abnormal   Collection Time: 04/04/19 11:18 AM  Result Value Ref Range   Glucose-Capillary 325 (H) 70 - 99 mg/dL  POC occult blood, ED RN will collect     Status: Abnormal   Collection Time: 04/04/19 11:55 AM  Result Value Ref Range   Fecal Occult Bld POSITIVE (A) NEGATIVE  Type and screen     Status: None (Preliminary result)   Collection Time: 04/04/19 12:18 PM  Result Value Ref Range   ABO/RH(D) O POS    Antibody Screen NEG    Sample Expiration      04/07/2019,2359 Performed at [facility], [address] [place], [place] [address]    Unit Number [id number]    Blood Component Type RED CELLS,LR    Unit division 00    Status of Unit ALLOCATED    Transfusion Status OK TO TRANSFUSE    Crossmatch Result Compatible    Unit Number [id number]    Blood Component Type RED CELLS,LR    Unit division 00    Status of Unit ALLOCATED    Transfusion Status OK TO TRANSFUSE    Crossmatch Result Compatible   Lactic acid, plasma     Status: Abnormal   Collection Time: 04/04/19 12:21 PM  Result Value Ref Range   Lactic Acid, Venous >11.0 (HH) 0.5 - 1.9 mmol/L  Prepare RBC     Status: None   Collection Time: 04/04/19 12:21 PM  Result Value Ref Range   Order Confirmation      ORDER PROCESSED BY BLOOD BANK Performed at [facility], [address], [place], [place] [address]   ABO/Rh     Status: None (Preliminary result)   Collection Time: 04/04/19 12:21 PM  Result Value Ref Range   ABO/RH(D)      Val Eagle POS Performed at [facility], [address] [place], [place] [address]   CBG monitoring, ED     Status: Abnormal   Collection Time: 04/04/19 12:24 PM  Result Value Ref Range   Glucose-Capillary 304 (H) 70 - 99 mg/dL  Ethanol     Status: None   Collection Time: 04/04/19  12:37 PM  Result Value Ref Range   Alcohol, Ethyl (B) <10 <10 mg/dL  CBG monitoring, ED     Status: Abnormal   Collection Time: 04/04/19  1:30 PM  Result Value Ref Range   Glucose-Capillary 261 (H) 70 - 99 mg/dL  CBC     Status: Abnormal   Collection Time: 04/04/19  1:40 PM  Result Value Ref Range   WBC 32.7 (H) 4.0 - 10.5 K/uL   RBC 1.83 (L) 4.22 - 5.81 MIL/uL   Hemoglobin 5.6 (LL) 13.0 - 17.0 g/dL   HCT 91.5 (  L) 39.0 - 52.0 %   MCV 95.6 80.0 - 100.0 fL   MCH 30.6 26.0 - 34.0 pg   MCHC 32.0 30.0 - 36.0 g/dL   RDW 95.6 21.3 - 08.6 %   Platelets 180 150 - 400 K/uL   nRBC 0.2 0.0 - 0.2 %     Dg Chest Port 1 View  Result Date: 04/04/2019 CLINICAL DATA:  Sepsis. EXAM: PORTABLE CHEST 1 VIEW COMPARISON:  05/12/2018. FINDINGS: Mediastinum and hilar structures normal. Lungs are clear. No pleural effusion or pneumothorax. Heart size normal. No acute bony abnormality. IMPRESSION: 1.  Cardiomegaly.  No pulmonary venous congestion. 2.  No focal infiltrate. Electronically Signed   By: Maisie Fus  Register   On: 04/04/2019 12:59    ROS:  As stated above in the HPI otherwise negative.  Blood pressure (!) 152/60, pulse (!) 124, temperature 98.4 F (36.9 C), temperature source Oral, resp. rate 12, SpO2 98 %.    PE: Gen: NAD, Alert and Oriented HEENT:  [place]/AT, EOMI Neck: Supple, no LAD Lungs: CTA Bilaterally CV: RRR without M/G/R ABM: Soft, NTND, +BS Ext: No C/C/E  Assessment/Plan: 1) Possible variceal bleed. 2) ETOH hepatitis. 3) DKA.   Unfortunately the patient has relapsed with his ETOH abuse.  His liver enzymes are consistent with an ETOH hepatitis.  The melenic stools are most likely from a variceal bleed.  He is hemodynamically stable.  The DKA precludes a safe endoscopy at this time.  The plan will be to perform an EGD with banding.  Plan: 1) Treatment for his DKA per primary team. 2) Follow HGB and transfuse as necessary.  Avoid transfusing >10 g/dL as this will increase portal  pressures and induce variceal bleeding. 3) EGD tomorrow. 4) Octreotide. 5) Follow closely for any signs/symptoms of ETOH withdrawal.  Connor Vaughn D 04/04/2019, 2:33 PM

## 2019-04-04 NOTE — ED Triage Notes (Signed)
Per EMS: Pt c/o of N/V since Sunday. Pt states he saw some blood in his emesis and states he has had some dark stools.  Pt diabetic and non-compliant with medications for 5-6 mo.   CBG 510 BP 160/90 HR 130 RR 20 O2 97% RA 20 gauge RAC 500 cc NS
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
## 2019-04-04 NOTE — Consult Note (Addendum)
PULMONARY / CRITICAL CARE MEDICINE   NAME:  Connor MontaneScott D Dozier, MRN:  [id number], DOB:  [birth year], LOS: 0 ADMISSION DATE:  04/04/2019, CONSULTATION DATE:  11/12 REFERRING MD:  [facility], CHIEF COMPLAINT:  Nausea/ weakness   BRIEF HISTORY:    58 yowm quit smoking x 10 y with h/o etohism presented am with h/o hematemesis/ melena and dropped hgb from 7 t o 5.6 with difficult IV access so CCM service called   HISTORY OF PRESENT ILLNESS   Patient was at most recent state of health up until this 11/8.  However for the past couple of years he has been dealing with intermittent syncope, poor functional status, and relapse into alcohol abuse.  He states he generally is homebound and needs a lot of assistance to get around.  He states he can ambulate in the home but it is a struggle.  He relapsed into alcohol use and reports using approximately 1/5 whiskey, give or take, a day then acutely worse 11/8  new onset diarrhea several times a day.  He states he initially had hematemesis 11/8  but for the last couple of days it has been coffee-ground emesis.  He states he is been having black tarry stools since 11/8 .  He has had no other evidence of bleeding.  He states he is not really had any abdominal pain, but has been having nausea.   STUDIES:    CULTURES:  covid 11/12 >>> Urine 11/12 >>> MRSA pcr 11/12 >>> BC x 2 11/12 >>>  ANTIBIOTICS:  Flagyl 11/12 >>> Maxepime 11/12 >>>  LINES/TUBES:  R IJ CVL  11/12 >>>   CONSULTANTS:  GI 11/12 >>> PCCM 11/12 >>>   SUBJECTIVE:  Nausea, last melena in ER 2 h prior to exam  CONSTITUTIONAL: BP 131/67   Pulse (!) 125   Temp 98.6 F (37 C) (Oral)   Resp 14   Ht 5\' 6"  (1.676 m)   Wt 92 kg   SpO2 100%   BMI 32.74 kg/m  RA  No intake/output data recorded.        PHYSICAL EXAM: General:  Acutely ill wm  Neuro:  Alert/ approp HEENT:  Neck supple Cardiovascular:  RRR no s3  Lungs:  Clear bilaterally to A and P Abdomen:  Mod distended no focal  tenderness  Musculoskeletal:  No deform Skin:  No rash/ skin breakdown     I personally reviewed images and agree with radiology impression as follows:  CXR:   04/04/19 1.  Cardiomegaly.  No pulmonary venous congestion. 2.  No focal infiltrate.       ASSESSMENT AND PLAN    1) life threatening GIB likely upper based on h/o hematemesis  >>> cvl for tx and ffp / monitor cvl/ draw blood >>> GI w/u in progess   2) DKA/ lactic acidosis combined disorder (urine ketones 5/ lactate elevate) >>> rx per [facility] for the dka/ restore blood volume should resolve the lactic acidosis though may be slower to clear given liver dz   3) Etoh pancreatitis/ hepatitis  >>> supportive rx, watch for dt's  4) Coagulopathy likely due to liver dz -  Transfuse with 2 units prbc's and monitor         Best Practice / Goals of Care / Disposition.   DVT PROPHYLAXIS:pas  SUP:ppi NUTRITION:npo MOBILITY:sbr GOALS OF CARE: stablize, w/u GIB FAMILY DISCUSSIONS: n/a  DISPOSITION icu  LABS  Glucose Recent Labs  Lab 04/04/19 1000 04/04/19 1118 04/04/19 1224 04/04/19 1330 04/04/19 1431  GLUCAP 431* 325* 304* 261* 218*    BMET Recent Labs  Lab 04/04/19 0948 04/04/19 1340 04/04/19 1430  NA 131* 134* 135  K 4.0 3.7 3.6  CL 89* 101 100  CO2 9* 14* 13*  BUN 52* 52* 51*  CREATININE 2.12* 1.79* 1.83*  GLUCOSE 424* 243* 233*    Liver Enzymes Recent Labs  Lab 04/04/19 0948 04/04/19 1340  AST 426* 641*  ALT 286* 435*  ALKPHOS 59 48  BILITOT 2.4* 1.5*  ALBUMIN 3.2* 2.6*    Electrolytes Recent Labs  Lab 04/04/19 0948 04/04/19 1340 04/04/19 1430  CALCIUM 8.2* 7.1* 7.4*  MG  --  1.8  --   PHOS  --  2.4*  --     CBC Recent Labs  Lab 04/04/19 0948 04/04/19 1340  WBC 35.9* 32.7*  HGB 7.0* 5.6*  HCT 22.1* 17.5*  PLT 233 180    ABG No results for input(s): PHART, PCO2ART, PO2ART in the last 168 hours.  Coag's Recent Labs  Lab 04/04/19 0948  APTT 28  INR 2.3*     Sepsis Markers Recent Labs  Lab 04/04/19 1221 04/04/19 1430  LATICACIDVEN >11.0*  --   PROCALCITON  --  0.16    Cardiac Enzymes No results for input(s): TROPONINI, PROBNP in the last 168 hours.  PAST MEDICAL HISTORY :   He  has a past medical history of Cirrhosis (HCC), Diabetes mellitus, type II (HCC), DKA (diabetic ketoacidoses) (HCC) (04/04/2019), and H/O fracture.  PAST SURGICAL HISTORY:  He  has a past surgical history that includes thoracocenteis.  Allergies  Allergen Reactions  . Latex Rash    No current facility-administered medications on file prior to encounter.    Current Outpatient Medications on File Prior to Encounter  Medication Sig  . ezetimibe (ZETIA) 10 MG tablet Take 10 mg by mouth daily.  Marland Kitchen glipiZIDE (GLUCOTROL) 5 MG tablet Take 5 mg by mouth daily before breakfast.   . hydrOXYzine (VISTARIL) 25 MG capsule Take 25 mg by mouth 4 (four) times daily.   Marland Kitchen LORazepam (ATIVAN) 1 MG tablet Take 1 mg by mouth daily.   . mometasone (NASONEX) 50 MCG/ACT nasal spray Place 2 sprays into the nose daily as needed (congestion).   . furosemide (LASIX) 40 MG tablet Take 2 tablets (80 mg total) by mouth daily. (Patient not taking: Reported on 04/04/2019)  . methocarbamol (ROBAXIN) 500 MG tablet Take 1 tablet (500 mg total) by mouth every 8 (eight) hours as needed for muscle spasms. (Patient not taking: Reported on 04/04/2019)  . spironolactone (ALDACTONE) 100 MG tablet Take 2 tablets (200 mg total) by mouth daily. (Patient not taking: Reported on 04/04/2019)    FAMILY HISTORY:   His family history includes Dementia in his father; Diabetes in his mother; Diabetes Mellitus II in his father; Heart failure in his father and mother; Valvular heart disease in his mother. There is no history of Seizures.  SOCIAL HISTORY:  He  reports that he quit smoking about 10 years ago. His smoking use included cigarettes. He has never used smokeless tobacco. He reports current alcohol  use. He reports that he does not use drugs.     The patient is critically ill with multiple organ systems failure and requires high complexity decision making for assessment and support, frequent evaluation and titration of therapies, application of advanced monitoring technologies and extensive interpretation of multiple databases. Critical Care Time devoted to patient care services described in this note is 45 minutes.  Christinia Gully, MD Pulmonary and [place] [phone] After 5:30 PM or weekends, use Beeper [phone]

## 2019-04-04 NOTE — H&P (View-Only) (Signed)
Reason for Consult: Upper GI bleed, history of ETOH/HCV cirrhosis Referring Physician: [facility]  Ralston Cheree DittoD Fauteux HPI: This is a 58 year old male with a PMH of HCV and ETOH cirrhosis.  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  The last office visit in the office was in 2018 and he was treated with Mavyret for his HCV genotype 2b.  The patient also remained sober and he was progressing clinically, but he had financial issues and he was not able to afford medicine and medical care.  The intention was to perform a screening EGD, but it was not a pressing procedure at that time.  The patient presents with complaints of weakness and he has suffered with several falls.  The blood work showed that he has DKA and he also restarted drinking ETOH one year ago.  He does report that he drank heavily.  Per his report, his last drink was one week ago.  His HGB was noted to be at 5.6 g/dL and reports melena, which is confirmed to be heme positive.  The melena started this past Sunday.  Past Medical History:  Diagnosis Date  . Cirrhosis (HCC)   . Diabetes mellitus, type II (HCC)   . DKA (diabetic ketoacidoses) (HCC) 04/04/2019  . H/O fracture    nasal x 2    Past Surgical History:  Procedure Laterality Date  . thoracocenteis      Family History  Problem Relation Age of Onset  . Diabetes Mother   . Heart failure Mother   . Valvular heart disease Mother   . Diabetes Mellitus II Father   . Heart failure Father   . Dementia Father   . Seizures Neg Hx     Social History:  reports that he quit smoking about 10 years ago. His smoking use included cigarettes. He has never used smokeless tobacco. He reports current alcohol use. He reports that he does not use drugs.  Allergies:  Allergies  Allergen Reactions  . Latex Rash    Medications:  Scheduled: . Chlorhexidine Gluconate Cloth  6 each Topical Daily  . folic acid  1 mg Oral Daily  . mouth rinse  15 mL  Mouth Rinse BID  . multivitamin with minerals  1 tablet Oral Daily  . octreotide  50 mcg Intravenous Once  . pantoprazole (PROTONIX) IV  40 mg Intravenous Q12H  . thiamine  100 mg Oral Daily   Or  . thiamine  100 mg Intravenous Daily   Continuous: . sodium chloride    . sodium chloride    . sodium chloride    . dextrose 5 % and 0.45% NaCl    . dextrose 5 % and 0.45% NaCl    . insulin    . lactated ringers with kcl    . metronidazole 500 mg (04/04/19 1353)  . metronidazole    . octreotide  (SANDOSTATIN)    IV infusion    . potassium chloride    . vancomycin      Results for orders placed or performed during the hospital encounter of 04/04/19 (from the past 24 hour(s))  Urinalysis, Routine w reflex microscopic     Status: Abnormal   Collection Time: 04/04/19  9:11 AM  Result Value Ref Range   Color, Urine YELLOW YELLOW   APPearance CLEAR CLEAR   Specific Gravity, Urine 1.017 1.005 - 1.030   pH 5.0 5.0 - 8.0   Glucose, UA >=500 (A) NEGATIVE mg/dL  Hgb urine dipstick NEGATIVE NEGATIVE   Bilirubin Urine NEGATIVE NEGATIVE   Ketones, ur 5 (A) NEGATIVE mg/dL   Protein, ur NEGATIVE NEGATIVE mg/dL   Nitrite NEGATIVE NEGATIVE   Leukocytes,Ua NEGATIVE NEGATIVE   RBC / HPF 0-5 0 - 5 RBC/hpf   WBC, UA 0-5 0 - 5 WBC/hpf   Bacteria, UA RARE (A) NONE SEEN   Mucus PRESENT    Hyaline Casts, UA PRESENT   CBC with Differential     Status: Abnormal   Collection Time: 04/04/19  9:48 AM  Result Value Ref Range   WBC 35.9 (H) 4.0 - 10.5 K/uL   RBC 2.27 (L) 4.22 - 5.81 MIL/uL   Hemoglobin 7.0 (L) 13.0 - 17.0 g/dL   HCT 22.1 (L) 39.0 - 52.0 %   MCV 97.4 80.0 - 100.0 fL   MCH 30.8 26.0 - 34.0 pg   MCHC 31.7 30.0 - 36.0 g/dL   RDW 15.2 11.5 - 15.5 %   Platelets 233 150 - 400 K/uL   nRBC 0.2 0.0 - 0.2 %   Neutrophils Relative % 75 %   Neutro Abs 27.0 (H) 1.7 - 7.7 K/uL   Lymphocytes Relative 10 %   Lymphs Abs 3.6 0.7 - 4.0 K/uL   Monocytes Relative 12 %   Monocytes Absolute 4.2 (H)  0.1 - 1.0 K/uL   Eosinophils Relative 0 %   Eosinophils Absolute 0.0 0.0 - 0.5 K/uL   Basophils Relative 0 %   Basophils Absolute 0.1 0.0 - 0.1 K/uL   Immature Granulocytes 3 %   Abs Immature Granulocytes 0.99 (H) 0.00 - 0.07 K/uL  Comprehensive metabolic panel     Status: Abnormal   Collection Time: 04/04/19  9:48 AM  Result Value Ref Range   Sodium 131 (L) 135 - 145 mmol/L   Potassium 4.0 3.5 - 5.1 mmol/L   Chloride 89 (L) 98 - 111 mmol/L   CO2 9 (L) 22 - 32 mmol/L   Glucose, Bld 424 (H) 70 - 99 mg/dL   BUN 52 (H) 6 - 20 mg/dL   Creatinine, Ser 2.12 (H) 0.61 - 1.24 mg/dL   Calcium 8.2 (L) 8.9 - 10.3 mg/dL   Total Protein 5.1 (L) 6.5 - 8.1 g/dL   Albumin 3.2 (L) 3.5 - 5.0 g/dL   AST 426 (H) 15 - 41 U/L   ALT 286 (H) 0 - 44 U/L   Alkaline Phosphatase 59 38 - 126 U/L   Total Bilirubin 2.4 (H) 0.3 - 1.2 mg/dL   GFR calc non Af Amer 34 (L) >60 mL/min   GFR calc Af Amer 39 (L) >60 mL/min   Anion gap 33 (H) 5 - 15  Lipase, blood     Status: Abnormal   Collection Time: 04/04/19  9:48 AM  Result Value Ref Range   Lipase 162 (H) 11 - 51 U/L  Blood gas, venous (at [facility], not at [facility])     Status: Abnormal   Collection Time: 04/04/19  9:48 AM  Result Value Ref Range   pH, Ven 7.229 (L) 7.250 - 7.430   pCO2, Ven 23.5 (L) 44.0 - 60.0 mmHg   pO2, Ven 41.3 32.0 - 45.0 mmHg   Bicarbonate 9.5 (L) 20.0 - 28.0 mmol/L   Acid-base deficit 16.6 (H) 0.0 - 2.0 mmol/L   O2 Saturation 56.1 %   Patient temperature 98.6   APTT     Status: None   Collection Time: 04/04/19  9:48 AM  Result Value Ref  Range   aPTT 28 24 - 36 seconds  Protime-INR     Status: Abnormal   Collection Time: 04/04/19  9:48 AM  Result Value Ref Range   Prothrombin Time 25.0 (H) 11.4 - 15.2 seconds   INR 2.3 (H) 0.8 - 1.2  Hemoglobin A1c     Status: Abnormal   Collection Time: 04/04/19  9:48 AM  Result Value Ref Range   Hgb A1c MFr Bld 6.1 (H) 4.8 - 5.6 %   Mean Plasma Glucose 128.37 mg/dL  CBG monitoring, ED      Status: Abnormal   Collection Time: 04/04/19 10:00 AM  Result Value Ref Range   Glucose-Capillary 431 (H) 70 - 99 mg/dL  CBG monitoring, ED     Status: Abnormal   Collection Time: 04/04/19 11:18 AM  Result Value Ref Range   Glucose-Capillary 325 (H) 70 - 99 mg/dL  POC occult blood, ED RN will collect     Status: Abnormal   Collection Time: 04/04/19 11:55 AM  Result Value Ref Range   Fecal Occult Bld POSITIVE (A) NEGATIVE  Type and screen     Status: None (Preliminary result)   Collection Time: 04/04/19 12:18 PM  Result Value Ref Range   ABO/RH(D) O POS    Antibody Screen NEG    Sample Expiration      04/07/2019,2359 Performed at [facility], [address] [place], [place] [address]    Unit Number [id number]    Blood Component Type RED CELLS,LR    Unit division 00    Status of Unit ALLOCATED    Transfusion Status OK TO TRANSFUSE    Crossmatch Result Compatible    Unit Number [id number]    Blood Component Type RED CELLS,LR    Unit division 00    Status of Unit ALLOCATED    Transfusion Status OK TO TRANSFUSE    Crossmatch Result Compatible   Lactic acid, plasma     Status: Abnormal   Collection Time: 04/04/19 12:21 PM  Result Value Ref Range   Lactic Acid, Venous >11.0 (HH) 0.5 - 1.9 mmol/L  Prepare RBC     Status: None   Collection Time: 04/04/19 12:21 PM  Result Value Ref Range   Order Confirmation      ORDER PROCESSED BY BLOOD BANK Performed at [facility], [address], [place], [place] [address]   ABO/Rh     Status: None (Preliminary result)   Collection Time: 04/04/19 12:21 PM  Result Value Ref Range   ABO/RH(D)      [place] POS Performed at [facility], [address] [place], [place] [address]   CBG monitoring, ED     Status: Abnormal   Collection Time: 04/04/19 12:24 PM  Result Value Ref Range   Glucose-Capillary 304 (H) 70 - 99 mg/dL  Ethanol     Status: None   Collection Time: 04/04/19  12:37 PM  Result Value Ref Range   Alcohol, Ethyl (B) <10 <10 mg/dL  CBG monitoring, ED     Status: Abnormal   Collection Time: 04/04/19  1:30 PM  Result Value Ref Range   Glucose-Capillary 261 (H) 70 - 99 mg/dL  CBC     Status: Abnormal   Collection Time: 04/04/19  1:40 PM  Result Value Ref Range   WBC 32.7 (H) 4.0 - 10.5 K/uL   RBC 1.83 (L) 4.22 - 5.81 MIL/uL   Hemoglobin 5.6 (LL) 13.0 - 17.0 g/dL   HCT 91.5 (  L) 39.0 - 52.0 %   MCV 95.6 80.0 - 100.0 fL   MCH 30.6 26.0 - 34.0 pg   MCHC 32.0 30.0 - 36.0 g/dL   RDW 15.5 11.5 - 15.5 %   Platelets 180 150 - 400 K/uL   nRBC 0.2 0.0 - 0.2 %     Dg Chest Port 1 View  Result Date: 04/04/2019 CLINICAL DATA:  Sepsis. EXAM: PORTABLE CHEST 1 VIEW COMPARISON:  05/12/2018. FINDINGS: Mediastinum and hilar structures normal. Lungs are clear. No pleural effusion or pneumothorax. Heart size normal. No acute bony abnormality. IMPRESSION: 1.  Cardiomegaly.  No pulmonary venous congestion. 2.  No focal infiltrate. Electronically Signed   By: Thomas  Register   On: 04/04/2019 12:59    ROS:  As stated above in the HPI otherwise negative.  Blood pressure (!) 152/60, pulse (!) 124, temperature 98.4 F (36.9 C), temperature source Oral, resp. rate 12, SpO2 98 %.    PE: Gen: NAD, Alert and Oriented HEENT:  [place]/AT, EOMI Neck: Supple, no LAD Lungs: CTA Bilaterally CV: RRR without M/G/R ABM: Soft, NTND, +BS Ext: No C/C/E  Assessment/Plan: 1) Possible variceal bleed. 2) ETOH hepatitis. 3) DKA.   Unfortunately the patient has relapsed with his ETOH abuse.  His liver enzymes are consistent with an ETOH hepatitis.  The melenic stools are most likely from a variceal bleed.  He is hemodynamically stable.  The DKA precludes a safe endoscopy at this time.  The plan will be to perform an EGD with banding.  Plan: 1) Treatment for his DKA per primary team. 2) Follow HGB and transfuse as necessary.  Avoid transfusing >10 g/dL as this will increase portal  pressures and induce variceal bleeding. 3) EGD tomorrow. 4) Octreotide. 5) Follow closely for any signs/symptoms of ETOH withdrawal.  Krystyne Tewksbury D 04/04/2019, 2:33 PM     

## 2019-04-04 NOTE — ED Notes (Signed)
Unable to obtain second set of cultures.  Pt very difficult stick.

## 2019-04-04 NOTE — ED Notes (Signed)
XR at bedside

## 2019-04-04 NOTE — Progress Notes (Signed)
A consult was received from an ED physician for vanc/cefepime per pharmacy dosing.  The patient's profile has been reviewed for ht/wt/allergies/indication/available labs.   A one time order has been placed for vanc 1g and cefepime 2g.  Further antibiotics/pharmacy consults should be ordered by admitting physician if indicated.                       Thank you, Kara Mead 04/04/2019  11:55 AM

## 2019-04-04 NOTE — ED Notes (Signed)
Date and time results received: 04/04/19 1:04 PM  (use smartphrase ".now" to insert current time)  Test: Lactic acid Critical Value: >11  Name of Provider Notified:Bowie PA  Orders Received? Or Actions Taken?: Awaiting further orders

## 2019-04-05 ENCOUNTER — Inpatient Hospital Stay (HOSPITAL_COMMUNITY): Payer: Self-pay | Admitting: Anesthesiology

## 2019-04-05 ENCOUNTER — Encounter (HOSPITAL_COMMUNITY): Payer: Self-pay | Admitting: *Deleted

## 2019-04-05 ENCOUNTER — Encounter (HOSPITAL_COMMUNITY)
Admission: EM | Disposition: A | Discharge status: Home Health | Payer: Self-pay | Source: Home / Self Care | Attending: Student

## 2019-04-05 DIAGNOSIS — E0810 Diabetes mellitus due to underlying condition with ketoacidosis without coma: Secondary | ICD-10-CM

## 2019-04-05 DIAGNOSIS — K8500 Idiopathic acute pancreatitis without necrosis or infection: Secondary | ICD-10-CM

## 2019-04-05 HISTORY — PX: ESOPHAGOGASTRODUODENOSCOPY (EGD) WITH PROPOFOL: SHX5813

## 2019-04-05 LAB — HEMOGLOBIN AND HEMATOCRIT, BLOOD
HCT: 23.7 % — ABNORMAL LOW (ref 39.0–52.0)
Hemoglobin: 8.1 g/dL — ABNORMAL LOW (ref 13.0–17.0)

## 2019-04-05 LAB — TYPE AND SCREEN
ABO/RH(D): O POS
Antibody Screen: NEGATIVE
Unit division: 0
Unit division: 0
Unit division: 0

## 2019-04-05 LAB — BASIC METABOLIC PANEL
Anion gap: 6 (ref 5–15)
Anion gap: 5 (ref 5–15)
BUN: 47 mg/dL — ABNORMAL HIGH (ref 6–20)
BUN: 44 mg/dL — ABNORMAL HIGH (ref 6–20)
CO2: 23 mmol/L (ref 22–32)
CO2: 23 mmol/L (ref 22–32)
Calcium: 6.8 mg/dL — ABNORMAL LOW (ref 8.9–10.3)
Calcium: 6.6 mg/dL — ABNORMAL LOW (ref 8.9–10.3)
Chloride: 104 mmol/L (ref 98–111)
Chloride: 106 mmol/L (ref 98–111)
Creatinine, Ser: 1.5 mg/dL — ABNORMAL HIGH (ref 0.61–1.24)
Creatinine, Ser: 1.43 mg/dL — ABNORMAL HIGH (ref 0.61–1.24)
GFR calc Af Amer: 59 mL/min — ABNORMAL LOW (ref >60)
GFR calc Af Amer: >60 mL/min (ref >60)
GFR calc non Af Amer: 51 mL/min — ABNORMAL LOW (ref >60)
GFR calc non Af Amer: 54 mL/min — ABNORMAL LOW (ref >60)
Glucose, Bld: 199 mg/dL — ABNORMAL HIGH (ref 70–99)
Glucose, Bld: 227 mg/dL — ABNORMAL HIGH (ref 70–99)
Potassium: 3.5 mmol/L (ref 3.5–5.1)
Potassium: 3.3 mmol/L — ABNORMAL LOW (ref 3.5–5.1)
Sodium: 133 mmol/L — ABNORMAL LOW (ref 135–145)
Sodium: 134 mmol/L — ABNORMAL LOW (ref 135–145)

## 2019-04-05 LAB — BPAM RBC
Blood Product Expiration Date: 202012112359
Blood Product Expiration Date: 202012112359
Blood Product Expiration Date: 202012112359
ISSUE DATE / TIME: 202011121525
ISSUE DATE / TIME: 202011121828
ISSUE DATE / TIME: 202011122046
Unit Type and Rh: 5100
Unit Type and Rh: 5100
Unit Type and Rh: 5100

## 2019-04-05 LAB — PROTIME-INR
INR: 1.8 — ABNORMAL HIGH (ref 0.8–1.2)
Prothrombin Time: 20.4 seconds — ABNORMAL HIGH (ref 11.4–15.2)

## 2019-04-05 LAB — PROCALCITONIN: Procalcitonin: 0.28 ng/mL

## 2019-04-05 LAB — GLUCOSE, CAPILLARY
Glucose-Capillary: 139 mg/dL — ABNORMAL HIGH (ref 70–99)
Glucose-Capillary: 147 mg/dL — ABNORMAL HIGH (ref 70–99)
Glucose-Capillary: 151 mg/dL — ABNORMAL HIGH (ref 70–99)
Glucose-Capillary: 177 mg/dL — ABNORMAL HIGH (ref 70–99)
Glucose-Capillary: 199 mg/dL — ABNORMAL HIGH (ref 70–99)
Glucose-Capillary: 206 mg/dL — ABNORMAL HIGH (ref 70–99)
Glucose-Capillary: 214 mg/dL — ABNORMAL HIGH (ref 70–99)
Glucose-Capillary: 245 mg/dL — ABNORMAL HIGH (ref 70–99)
Glucose-Capillary: 260 mg/dL — ABNORMAL HIGH (ref 70–99)
Glucose-Capillary: 313 mg/dL — ABNORMAL HIGH (ref 70–99)

## 2019-04-05 SURGERY — ESOPHAGOGASTRODUODENOSCOPY (EGD) WITH PROPOFOL
Anesthesia: Monitor Anesthesia Care

## 2019-04-05 MED ORDER — INSULIN ASPART 100 UNIT/ML ~~LOC~~ SOLN
0.0000 [IU] | SUBCUTANEOUS | Status: DC
  Administered 2019-04-05: 3 [IU] via SUBCUTANEOUS
  Administered 2019-04-05: 7 [IU] via SUBCUTANEOUS
  Administered 2019-04-05: 5 [IU] via SUBCUTANEOUS
  Administered 2019-04-05 – 2019-04-06 (×2): 2 [IU] via SUBCUTANEOUS
  Administered 2019-04-06: 3 [IU] via SUBCUTANEOUS
  Administered 2019-04-06 (×2): 2 [IU] via SUBCUTANEOUS
  Administered 2019-04-06: 1 [IU] via SUBCUTANEOUS
  Administered 2019-04-06: 2 [IU] via SUBCUTANEOUS
  Administered 2019-04-06: 1 [IU] via SUBCUTANEOUS
  Administered 2019-04-07: 3 [IU] via SUBCUTANEOUS
  Administered 2019-04-07 (×2): 2 [IU] via SUBCUTANEOUS
  Administered 2019-04-07: 1 [IU] via SUBCUTANEOUS
  Administered 2019-04-07 – 2019-04-08 (×2): 5 [IU] via SUBCUTANEOUS
  Administered 2019-04-08 (×2): 3 [IU] via SUBCUTANEOUS
  Administered 2019-04-08 (×3): 1 [IU] via SUBCUTANEOUS
  Administered 2019-04-09 (×3): 2 [IU] via SUBCUTANEOUS
  Administered 2019-04-09 (×2): 3 [IU] via SUBCUTANEOUS
  Administered 2019-04-09: 2 [IU] via SUBCUTANEOUS
  Administered 2019-04-09: 3 [IU] via SUBCUTANEOUS
  Administered 2019-04-10 (×2): 2 [IU] via SUBCUTANEOUS
  Administered 2019-04-10 (×2): 3 [IU] via SUBCUTANEOUS
  Administered 2019-04-10 – 2019-04-12 (×8): 2 [IU] via SUBCUTANEOUS
  Administered 2019-04-12: 3 [IU] via SUBCUTANEOUS
  Administered 2019-04-12: 1 [IU] via SUBCUTANEOUS
  Administered 2019-04-12 (×2): 2 [IU] via SUBCUTANEOUS
  Administered 2019-04-13: 1 [IU] via SUBCUTANEOUS
  Administered 2019-04-13 (×2): 2 [IU] via SUBCUTANEOUS
  Administered 2019-04-13: 1 [IU] via SUBCUTANEOUS
  Administered 2019-04-13: 100 [IU] via SUBCUTANEOUS
  Administered 2019-04-14 – 2019-04-15 (×6): 2 [IU] via SUBCUTANEOUS
  Administered 2019-04-15: 1 [IU] via SUBCUTANEOUS
  Administered 2019-04-15: 3 [IU] via SUBCUTANEOUS
  Administered 2019-04-15: 5 [IU] via SUBCUTANEOUS
  Administered 2019-04-15 – 2019-04-16 (×2): 1 [IU] via SUBCUTANEOUS
  Administered 2019-04-16: 3 [IU] via SUBCUTANEOUS
  Administered 2019-04-16 (×2): 1 [IU] via SUBCUTANEOUS

## 2019-04-05 MED ORDER — LACTATED RINGERS IV SOLN
INTRAVENOUS | Status: DC
  Administered 2019-04-05 – 2019-04-08 (×5): via INTRAVENOUS

## 2019-04-05 MED ORDER — PROPOFOL 10 MG/ML IV BOLUS
INTRAVENOUS | Status: DC | PRN
  Administered 2019-04-05 (×2): 20 mg via INTRAVENOUS

## 2019-04-05 MED ORDER — PROPOFOL 500 MG/50ML IV EMUL
INTRAVENOUS | Status: DC | PRN
  Administered 2019-04-05: 125 ug/kg/min via INTRAVENOUS

## 2019-04-05 MED ORDER — LABETALOL HCL 5 MG/ML IV SOLN
10.0000 mg | INTRAVENOUS | Status: DC | PRN
  Administered 2019-04-05 – 2019-04-07 (×3): 10 mg via INTRAVENOUS
  Filled 2019-04-05 (×4): qty 4

## 2019-04-05 MED ORDER — INSULIN GLARGINE 100 UNIT/ML ~~LOC~~ SOLN
10.0000 [IU] | Freq: Every day | SUBCUTANEOUS | Status: DC
  Administered 2019-04-05 – 2019-04-09 (×5): 10 [IU] via SUBCUTANEOUS
  Filled 2019-04-05 (×6): qty 0.1

## 2019-04-05 MED ORDER — PANTOPRAZOLE SODIUM 40 MG PO TBEC
40.0000 mg | DELAYED_RELEASE_TABLET | Freq: Two times a day (BID) | ORAL | Status: DC
  Administered 2019-04-05 – 2019-04-16 (×22): 40 mg via ORAL
  Filled 2019-04-05 (×21): qty 1

## 2019-04-05 MED ORDER — POTASSIUM CHLORIDE 10 MEQ/100ML IV SOLN
10.0000 meq | INTRAVENOUS | Status: AC
  Administered 2019-04-05 (×2): 10 meq via INTRAVENOUS
  Filled 2019-04-05 (×3): qty 100

## 2019-04-05 MED ORDER — PROPOFOL 500 MG/50ML IV EMUL
INTRAVENOUS | Status: AC
  Filled 2019-04-05: qty 50

## 2019-04-05 MED ORDER — SODIUM CHLORIDE 0.9 % IV SOLN
INTRAVENOUS | Status: DC
  Administered 2019-04-05: 14:00:00 via INTRAVENOUS

## 2019-04-05 MED ORDER — LIDOCAINE 2% (20 MG/ML) 5 ML SYRINGE
INTRAMUSCULAR | Status: DC | PRN
  Administered 2019-04-05: 100 mg via INTRAVENOUS

## 2019-04-05 MED ORDER — PROPOFOL 10 MG/ML IV BOLUS
INTRAVENOUS | Status: AC
  Filled 2019-04-05: qty 20

## 2019-04-05 MED ORDER — VITAMIN K1 10 MG/ML IJ SOLN
10.0000 mg | Freq: Every day | INTRAMUSCULAR | Status: AC
  Administered 2019-04-05 – 2019-04-06 (×2): 10 mg via SUBCUTANEOUS
  Filled 2019-04-05 (×2): qty 1

## 2019-04-05 MED ORDER — SODIUM CHLORIDE 0.9% IV SOLUTION: Freq: Once | INTRAVENOUS | Status: DC

## 2019-04-05 SURGICAL SUPPLY — 15 items

## 2019-04-05 NOTE — Transfer of Care (Signed)
Immediate Anesthesia Transfer of Care Note  Patient: Connor Vaughn  Procedure(s) Performed: ESOPHAGOGASTRODUODENOSCOPY (EGD) WITH PROPOFOL (N/A )  Patient Location: Endoscopy Unit  Anesthesia Type:MAC  Level of Consciousness: drowsy  Airway & Oxygen Therapy: Patient Spontanous Breathing and Patient connected to face mask oxygen  Post-op Assessment: Report given to RN and Post -op Vital signs reviewed and stable  Post vital signs: Reviewed and stable  Last Vitals:  Vitals Value Taken Time  BP    Temp    Pulse    Resp    SpO2      Last Pain:  Vitals:   04/05/19 1133  TempSrc: Oral  PainSc: 0-No pain         Complications: No apparent anesthesia complications

## 2019-04-05 NOTE — Progress Notes (Signed)
Spoke w/ Dr Benson Norway.  Will dc octreotide and change PPI to BID  Adv diet as tolerated.   Erick Colace ACNP-BC [place] Pager # [phone] OR # [phone] if no answer

## 2019-04-05 NOTE — Anesthesia Postprocedure Evaluation (Signed)
Anesthesia Post Note  Patient: Connor Vaughn  Procedure(s) Performed: ESOPHAGOGASTRODUODENOSCOPY (EGD) WITH PROPOFOL (N/A )     Patient location during evaluation: PACU Anesthesia Type: MAC Level of consciousness: awake and alert Pain management: pain level controlled Vital Signs Assessment: post-procedure vital signs reviewed and stable Respiratory status: spontaneous breathing, nonlabored ventilation, respiratory function stable and patient connected to nasal cannula oxygen Cardiovascular status: stable and blood pressure returned to baseline Postop Assessment: no apparent nausea or vomiting Anesthetic complications: no    Last Vitals:  Vitals:   04/05/19 1310 04/05/19 1320  BP: (!) 147/83 140/83  Pulse: (!) 32 93  Resp: 17 18  Temp:    SpO2: 97% 95%    Last Pain:  Vitals:   04/05/19 1305  TempSrc: Oral  PainSc: 0-No pain                 Effie Berkshire

## 2019-04-05 NOTE — Op Note (Signed)
[facility] Patient Name: Connor MoulderScott Vaughn Procedure Date: 04/05/2019 MRN: [id number] Attending MD: Jeani HawkingPatrick Fartun Paradiso , MD Date of Birth: [birth year] CSN: [id number] Age: 58 Admit Type: Inpatient Procedure:                Upper GI endoscopy Indications:              Heme positive stool, Melena Providers:                Jeani HawkingPatrick Tanveer Brammer, MD, Dwain SarnaPatricia Ford, RN, Arlee Muslimhris Chandler                            Tech., Technician, Leroy Libmaniana Reardon, CRNA Referring MD:              Medicines:                Propofol per Anesthesia Complications:            No immediate complications. Estimated Blood Loss:     Estimated blood loss: none. Procedure:                Pre-Anesthesia Assessment:                           - Prior to the procedure, a History and Physical                            was performed, and patient medications and                            allergies were reviewed. The patient's tolerance of                            previous anesthesia was also reviewed. The risks                            and benefits of the procedure and the sedation                            options and risks were discussed with the patient.                            All questions were answered, and informed consent                            was obtained. Prior Anticoagulants: The patient has                            taken no previous anticoagulant or antiplatelet                            agents. ASA Grade Assessment: III - A patient with                            severe systemic disease. After reviewing the risks  and benefits, the patient was deemed in                            satisfactory condition to undergo the procedure.                           - Sedation was administered by an anesthesia                            professional. Deep sedation was attained.                           After obtaining informed consent, the endoscope was   passed under direct vision. Throughout the                            procedure, the patient's blood pressure, pulse, and                            oxygen saturations were monitored continuously. The                            [id number]-[id number] ([id number]) Olympus endoscope was introduced                            through the mouth, and advanced to the second part                            of duodenum. The upper GI endoscopy was                            accomplished without difficulty. The patient                            tolerated the procedure well. Scope In: Scope Out: Findings:      LA Grade D (one or more mucosal breaks involving at least 75% of       esophageal circumference) esophagitis with no bleeding was found in the       lower third of the esophagus.      Large (> 5 mm) varices were found in the middle third of the esophagus       and in the lower third of the esophagus.      Mild portal hypertensive gastropathy was found in the gastric fundus and       in the gastric body.      The examined duodenum was normal. Impression:               - LA Grade D reflux esophagitis with no bleeding.                           - Large (> 5 mm) esophageal varices.                           - Portal hypertensive gastropathy.                           -  Normal examined duodenum.                           - No specimens collected. Moderate Sedation:      Not Applicable - Patient had care per Anesthesia. Recommendation:           - Patient has a contact number available for                            emergencies. The signs and symptoms of potential                            delayed complications were discussed with the                            patient. Return to normal activities tomorrow.                            Written discharge instructions were provided to the                            patient.                           - Resume previous diet.                           - Continue  present medications.                           - 2 gram sodium diet.                           - PPI BID x 1 month and then QD.                           - Follow HGB. Avoid transfusing beyond 10 g/dL. Procedure Code(s):        --- Professional ---                           [phone], Esophagogastroduodenoscopy, flexible,                            transoral; diagnostic, including collection of                            specimen(s) by brushing or washing, when performed                            (separate procedure) Diagnosis Code(s):        --- Professional ---                           K21.00, Gastro-esophageal reflux disease with                            esophagitis, without bleeding  I85.00, Esophageal varices without bleeding                           K76.6, Portal hypertension                           K31.89, Other diseases of stomach and duodenum                           R19.5, Other fecal abnormalities                           K92.1, Melena (includes Hematochezia) CPT copyright 2019 [employer] Association. All rights reserved. The codes documented in this report are preliminary and upon coder review may  be revised to meet current compliance requirements. Jeani Hawking, MD Jeani Hawking, MD 04/05/2019 1:01:06 PM This report has been signed electronically. Number of Addenda: 0

## 2019-04-05 NOTE — Interval H&P Note (Signed)
History and Physical Interval Note:  04/05/2019 11:32 AM  Connor Vaughn  has presented today for surgery, with the diagnosis of GI bleed.  The various methods of treatment have been discussed with the patient and family. After consideration of risks, benefits and other options for treatment, the patient has consented to  Procedure(s): ESOPHAGOGASTRODUODENOSCOPY (EGD) WITH PROPOFOL (N/A) as a surgical intervention.  The patient's history has been reviewed, patient examined, no change in status, stable for surgery.  I have reviewed the patient's chart and labs.  Questions were answered to the patient's satisfaction.     Adisyn Ruscitti D

## 2019-04-05 NOTE — Anesthesia Preprocedure Evaluation (Addendum)
Anesthesia Evaluation  Patient identified by MRN, date of birth, ID band Patient awake    Reviewed: Allergy & Precautions, NPO status , Patient's Chart, lab work & pertinent test results  Airway Mallampati: II  TM Distance: >3 FB Neck ROM: Full    Dental  (+) Teeth Intact, Dental Advisory Given   Pulmonary former smoker,    breath sounds clear to auscultation       Cardiovascular negative cardio ROS   Rhythm:Regular Rate:Normal     Neuro/Psych PSYCHIATRIC DISORDERS    GI/Hepatic negative GI ROS, (+) Cirrhosis       ,   Endo/Other  diabetes, Type 2, Oral Hypoglycemic Agents  Renal/GU Renal disease     Musculoskeletal negative musculoskeletal ROS (+)   Abdominal (+) + obese,   Peds  Hematology   Anesthesia Other Findings   Reproductive/Obstetrics                            Anesthesia Physical Anesthesia Plan  ASA: III  Anesthesia Plan: MAC   Post-op Pain Management:    Induction: Intravenous  PONV Risk Score and Plan: 0 and Propofol infusion and Treatment may vary due to age or medical condition  Airway Management Planned:   Additional Equipment: None  Intra-op Plan:   Post-operative Plan:   Informed Consent: I have reviewed the patients History and Physical, chart, labs and discussed the procedure including the risks, benefits and alternatives for the proposed anesthesia with the patient or authorized representative who has indicated his/her understanding and acceptance.       Plan Discussed with: CRNA  Anesthesia Plan Comments:         Anesthesia Quick Evaluation

## 2019-04-05 NOTE — Progress Notes (Signed)
PROGRESS NOTE    Connor Vaughn  FUX:[id number] DOB: 1960-07-10 DOA: 04/04/2019 PCP: Enid Skeens., MD   Brief Narrative: Connor Vaughn is a 58 y.o. 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  He reports being homeless and needs a lot of assistance to get around.  He has not had insurance so he reports not taking any of his home medication for last 6 months or probably longer. He reports having hematemesis few days back which has been coffee-ground emesis.  He also reports having black tarry stools since yesterday. He was seen by GI, he was started on octreotide.  He is a scheduled to have endoscopy today.  DKA has resolved,  sepsis is improving,  renal functions are improving.   Assessment & Plan:   Active Problems:   DKA (diabetic ketoacidoses) ([place])   Encounter for central line placement   Sepsis with acute renal failure without septic shock (HCC)   Acute blood loss anemia   Pancreatitis   Lactic acidosis   Upper GI bleed   #DKA - Resolved. #Anion gap metabolic acidosis - gap closed.  #Severe sepsis - improving. -Unclear if patient had an underlying infectious trigger though has concerning symptoms and abnormal lab values. he denied chest pain but he has been off of medications for greater than 6 months and without a recent A1c.  Patient without abdominal pain on exam and suspect diarrhea is secondary to bleed. -Continued on DKA protocol.  Anion gap closed. - NPO for possible EGD. -Blood cultures, urine cultures, chest x-ray without opacity. -Procalcitonin 0.28 -Continue empiric antibiotics with cefepime/Flagyl/vancomycin - given syncope/severity of illness and need for additional access, Dr. Carlis Abbott of ICU was consulted  for assistance, appreciate help in care of this patient  #Upper GI bleed - May be variceal bleeding versus Mallory-Weiss tear versus PUD versus gastritis - Patient's presenting hemoglobin is 7 but anticipate  dropping with management of DKA and sepsis. -  transfuse to maintain hemoglobin greater than 7 if needed. -  S/p transfusion 2 PRBC. - continue BID PPI and Octreotide gtt - patient on SBP proph given empiric treatment for sepsis as above - Dr. Benson Norway, GI consulted. Scheduled for EGD today. -Follow-up post EGD recommendation.  #AKI -improving -Secondary to the above, prerenal,  -Anticipate improvement with fluids of blood products -Monitor I's and O's -Avoid nephrotoxic agents  # Syncope - no prior diagnosis, but concern worsening symptoms presentation this week is due to problems above - continue to monitor on telemetry for arrhythmia, but further work-up pending resolution of above Rx - PT/OT evaluations after acute medical conditions stabilized.  #Cirrhosis -Without encephalopathy, likely secondary to alcohol but has history of hepatitis C status post treatment -Previously on Lasix and spironolactone but has not been on medications due to financial instability.   #Elevated lactate -This is likely a combination of patient cirrhosis and inability to metabolize, as well as contributing tissue hypoperfusion from sepsis and DKA and potentially thiamine deficiency/cofactor deficiency -Admission value was greater than 11, will trend  # Transaminitis - primarily hepatocellular  - patient reports last drink was past Sunday, LFTs appear c/w EtOH pattern, but also possibly hypoperfusion in setting of DKA/Sepsis.   #Alcohol abuse/withdrawal - CIWA protocol, patient's last drink was this Sunday - continue thiamine, folate,  MVI  #Financial concerns - social work consult, when acute medical issues stabilize   DVT prophylaxis: SCDs, patient with active bleed. Code Status:  Full code Family Communication: Case discussed with patient in detail.  Disposition Plan: SNF versus home with home meds.  Consultants:   GI Dr. Elnoria Howard.  Procedures: Going for EGD today  Antimicrobials: Anti-infectives (From admission, onward)   Start     Dose/Rate Route Frequency Ordered Stop   04/05/19 1500  [MAR Hold]  vancomycin (VANCOCIN) IVPB 1000 mg/200 mL premix     (MAR Hold since Fri 04/05/2019 at 1133.Hold Reason: Transfer to a Procedural area.)   1,000 mg 200 mL/hr over 60 Minutes Intravenous Every 24 hours 04/04/19 1452     04/05/19 0100  [MAR Hold]  ceFEPIme (MAXIPIME) 2 g in sodium chloride 0.9 % 100 mL IVPB     (MAR Hold since Fri 04/05/2019 at 1133.Hold Reason: Transfer to a Procedural area.)   2 g 200 mL/hr over 30 Minutes Intravenous Every 12 hours 04/04/19 1452     04/04/19 2200  [MAR Hold]  metroNIDAZOLE (FLAGYL) IVPB 500 mg     (MAR Hold since Fri 04/05/2019 at 1133.Hold Reason: Transfer to a Procedural area.)   500 mg 100 mL/hr over 60 Minutes Intravenous Every 8 hours 04/04/19 1332     04/04/19 1500  vancomycin (VANCOCIN) 2,000 mg in sodium chloride 0.9 % 500 mL IVPB     2,000 mg 250 mL/hr over 120 Minutes Intravenous NOW 04/04/19 1452 04/04/19 1707   04/04/19 1145  ceFEPIme (MAXIPIME) 2 g in sodium chloride 0.9 % 100 mL IVPB     2 g 200 mL/hr over 30 Minutes Intravenous  Once 04/04/19 1139 04/04/19 1354   04/04/19 1145  metroNIDAZOLE (FLAGYL) IVPB 500 mg     500 mg 100 mL/hr over 60 Minutes Intravenous  Once 04/04/19 1139 04/04/19 1630   04/04/19 1145  vancomycin (VANCOCIN) IVPB 1000 mg/200 mL premix  Status:  Discontinued     1,000 mg 200 mL/hr over 60 Minutes Intravenous  Once 04/04/19 1139 04/04/19 1452      Subjective: Patient was seen and examined at bedside, he still complains of mild abdominal pain denies nausea and vomiting.  He is aware that he is going for EGD.  Objective: Vitals:   04/05/19 0500 04/05/19 0600 04/05/19 0800 04/05/19 1133   BP: 114/77 132/72 116/72 136/78  Pulse: 96 94 90 100  Resp:   17 16  Temp:   98.1 F (36.7 C) 97.9 F (36.6 C)  TempSrc:   Oral Oral  SpO2:   92% 98%  Weight: 92.1 kg   92 kg  Height:     (1.676 m)    Intake/Output Summary (Last 24 hours) at 04/05/2019 1250 Last data filed at 04/05/2019 0930 Gross per 24 hour  Intake 9275.82 ml  Output 1275 ml  Net 8000.82 ml   Filed Weights   04/04/19 1429 04/05/19 0500 04/05/19 1133  Weight: 92 kg 92.1 kg 92 kg    Examination:  General exam: Appears calm and comfortable  Respiratory system: Clear to auscultation. Respiratory effort normal. Cardiovascular system: S1 & S2 heard, RRR. No JVD, murmurs, rubs, gallops or clicks. No pedal edema. Gastrointestinal system: Abdomen is nondistended, soft and mildly tender. No organomegaly or masses felt. Normal bowel sounds heard. Central nervous system: Alert and oriented. No focal neurological deficits. Extremities: Symmetric 5 x 5 power. Skin: No rashes, lesions or ulcers Psychiatry: Judgement and insight appear normal. Mood &  affect appropriate.   Data Reviewed: I have personally reviewed following labs and imaging studies  CBC: Recent Labs  Lab 04/04/19 0948 04/04/19 1340 04/05/19 0152  WBC 35.9* 32.7*  --   NEUTROABS 27.0*  --   --   HGB 7.0* 5.6* 8.1*  HCT 22.1* 17.5* 23.7*  MCV 97.4 95.6  --   PLT 233 180  --    Basic Metabolic Panel: Recent Labs  Lab 04/04/19 1340 04/04/19 1430 04/04/19 1744 04/05/19 0152 04/05/19 0520  NA 134* 135 135 133* 134*  K 3.7 3.6 3.6 3.5 3.3*  CL 101 100 104 104 106  CO2 14* 13* 21* 23 23  GLUCOSE 243* 233* 179* 199* 227*  BUN 52* 51* 51* 47* 44*  CREATININE 1.79* 1.83* 1.58* 1.50* 1.43*  CALCIUM 7.1* 7.4* 7.1* 6.8* 6.6*  MG 1.8  --   --   --   --   PHOS 2.4*  --   --   --   --    GFR: Estimated Creatinine Clearance: 61.3 mL/min (A) (by C-G formula based on SCr of 1.43 mg/dL (H)). Liver Function Tests: Recent Labs  Lab  04/04/19 0948 04/04/19 1340  AST 426* 641*  ALT 286* 435*  ALKPHOS 59 48  BILITOT 2.4* 1.5*  PROT 5.1* 4.1*  ALBUMIN 3.2* 2.6*   Recent Labs  Lab 04/04/19 0948  LIPASE 162*   No results for input(s): AMMONIA in the last 168 hours. Coagulation Profile: Recent Labs  Lab 04/04/19 0948  INR 2.3*   Cardiac Enzymes: No results for input(s): CKTOTAL, CKMB, CKMBINDEX, TROPONINI in the last 168 hours. BNP (last 3 results) No results for input(s): PROBNP in the last 8760 hours. HbA1C: Recent Labs    04/04/19 0948 04/04/19 1430  HGBA1C 6.1* 6.1*   CBG: Recent Labs  Lab 04/05/19 0151 04/05/19 0306 04/05/19 0453 04/05/19 0509 04/05/19 0729  GLUCAP 147* 177* 206* 199* 214*   Lipid Profile: No results for input(s): CHOL, HDL, LDLCALC, TRIG, CHOLHDL, LDLDIRECT in the last 72 hours. Thyroid Function Tests: No results for input(s): TSH, T4TOTAL, FREET4, T3FREE, THYROIDAB in the last 72 hours. Anemia Panel: No results for input(s): VITAMINB12, FOLATE, FERRITIN, TIBC, IRON, RETICCTPCT in the last 72 hours. Sepsis Labs: Recent Labs  Lab 04/04/19 1221 04/04/19 1430 04/04/19 1447 04/04/19 1733 04/05/19 0520  PROCALCITON  --  0.16  --   --  0.28  LATICACIDVEN >11.0*  --  >11.0* 6.0*  --     Recent Results (from the past 240 hour(s))  SARS CORONAVIRUS 2 (TAT 6-24 HRS) Nasopharyngeal Urine, Clean Catch     Status: None   Collection Time: 04/04/19 11:28 AM   Specimen: Urine, Clean Catch; Nasopharyngeal  Result Value Ref Range Status   SARS Coronavirus 2 NEGATIVE NEGATIVE Final    Comment: (NOTE) SARS-CoV-2 target nucleic acids are NOT DETECTED. The SARS-CoV-2 RNA is generally detectable in upper and lower respiratory specimens during the acute phase of infection. Negative results do not preclude SARS-CoV-2 infection, do not rule out co-infections with other pathogens, and should not be used as the sole basis for treatment or other patient management decisions. Negative  results must be combined with clinical observations, patient history, and epidemiological information. The expected result is Negative. Fact Sheet for Patients: HairSlick.nohttps://www.fda.gov/media/138098/download Fact Sheet for Healthcare Providers: quierodirigir.comhttps://www.fda.gov/media/138095/download This test is not yet approved or cleared by the [place] FDA and  has been authorized for detection and/or diagnosis of SARS-CoV-2 by FDA under an Emergency Use Authorization (  EUA). This EUA will remain  in effect (meaning this test can be used) for the duration of the COVID-19 declaration under Section 56 4(b)(1) of the Act, 21 U.S.C. section 360bbb-3(b)(1), unless the authorization is terminated or revoked sooner. Performed at [facility], [address], [place], [place] [address]   Blood Culture (routine x 2)     Status: None (Preliminary result)   Collection Time: 04/04/19 12:21 PM   Specimen: BLOOD  Result Value Ref Range Status   Specimen Description   Final    BLOOD RIGHT WRIST Performed at [facility], [address] [place], [place] [address]    Special Requests   Final    BOTTLES DRAWN AEROBIC ONLY Blood Culture adequate volume Performed at [facility], [address] [place], [place] [address]    Culture   Final    NO GROWTH < 24 HOURS Performed at [facility], [address] [place], [place] [address]    Report Status PENDING  Incomplete  MRSA PCR Screening     Status: None   Collection Time: 04/04/19  1:36 PM   Specimen: Nasal Mucosa; Nasopharyngeal  Result Value Ref Range Status   MRSA by PCR NEGATIVE NEGATIVE Final    Comment:        The GeneXpert MRSA Assay (FDA approved for NASAL specimens only), is one component of a comprehensive MRSA colonization surveillance program. It is not intended to diagnose MRSA infection nor to guide or monitor treatment for MRSA infections. Performed at [facility], [address], [place], [place] [address]   Culture, blood (single)     Status: None (Preliminary result)   Collection Time: 04/04/19  2:30 PM   Specimen: BLOOD  Result Value Ref Range Status   Specimen Description   Final    BLOOD LEFT HAND Performed at [facility], [address] [place], [place] [address]    Special Requests   Final    BOTTLES DRAWN AEROBIC ONLY Blood Culture adequate volume Performed at [facility], [address], [place], [place] [address]    Culture   Final    NO GROWTH < 24 HOURS Performed at [facility], [address], [place], [place] [address]    Report Status PENDING  Incomplete     Radiology Studies: Ct Head Wo Contrast  Result Date: 04/04/2019 CLINICAL DATA:  Intermittent syncope.  Recent fall. EXAM: CT HEAD WITHOUT CONTRAST TECHNIQUE: Contiguous axial images were obtained from the base of the skull through the vertex without intravenous contrast. COMPARISON:  05/12/2018 FINDINGS: Brain: The brain shows a normal appearance without evidence of malformation, atrophy, old or acute small or large vessel infarction, mass lesion, hemorrhage, hydrocephalus or extra-axial collection. Vascular: No hyperdense vessel. No evidence of atherosclerotic calcification. Skull: Normal.  No traumatic finding.  No focal bone lesion. Sinuses/Orbits: Sinuses are clear. Orbits appear normal. Mastoids are clear. Other: None significant IMPRESSION: Normal head CT. Electronically Signed   By: Paulina Fusi M.D.   On: 04/04/2019 18:07   Dg Chest Port 1 View  Result Date: 04/04/2019 CLINICAL DATA:  Central line placement. EXAM: PORTABLE CHEST 1 VIEW COMPARISON:  Prior study 04/04/2019. FINDINGS: Right IJ line noted with tip over cavoatrial junction. Mediastinum appears stable. Stable cardiomegaly. No pulmonary venous congestion. Lungs are clear. Low lung volumes. No pleural effusion or pneumothorax. No acute bony abnormality  IMPRESSION: Interim placement right IJ line, its  tip is at the cavoatrial junction. No evidence of pneumothorax. Electronically Signed   By: Maisie Fus  Register   On: 04/04/2019 16:16   Dg Chest Port 1 View  Result Date: 04/04/2019 CLINICAL DATA:  Sepsis. EXAM: PORTABLE CHEST 1 VIEW COMPARISON:  05/12/2018. FINDINGS: Mediastinum and hilar structures normal. Lungs are clear. No pleural effusion or pneumothorax. Heart size normal. No acute bony abnormality. IMPRESSION: 1.  Cardiomegaly.  No pulmonary venous congestion. 2.  No focal infiltrate. Electronically Signed   By: Maisie Fus  Register   On: 04/04/2019 12:59    Scheduled Meds: . [MAR Hold] Chlorhexidine Gluconate Cloth  6 each Topical Daily  . [MAR Hold] folic acid  1 mg Oral Daily  . [MAR Hold] insulin aspart  0-9 Units Subcutaneous Q4H  . [MAR Hold] insulin glargine  10 Units Subcutaneous Q0600  . [MAR Hold] mouth rinse  15 mL Mouth Rinse BID  . [MAR Hold] multivitamin with minerals  1 tablet Oral Daily  . [MAR Hold] pantoprazole (PROTONIX) IV  40 mg Intravenous Q12H  . [MAR Hold] phytonadione  10 mg Subcutaneous Daily  . [MAR Hold] thiamine  100 mg Oral Daily   Or  . [MAR Hold] thiamine  100 mg Intravenous Daily  . [MAR Hold] traZODone  25 mg Oral QHS   Continuous Infusions: . sodium chloride 0 mL/hr at 04/05/19 0926  . sodium chloride    . [MAR Hold] ceFEPime (MAXIPIME) IV Stopped (04/05/19 0113)  . lactated ringers    . [MAR Hold] metronidazole Stopped (04/05/19 0705)  . octreotide  (SANDOSTATIN)    IV infusion 50 mcg/hr (04/05/19 1055)  . [MAR Hold] vancomycin       LOS: 1 day    Time spent:     Cipriano Bunker, MD [facility]   If 7PM-7AM, please contact night-coverage

## 2019-04-05 NOTE — Progress Notes (Addendum)
PULMONARY / CRITICAL CARE MEDICINE   NAME:  Connor Vaughn, MRN:  [id number], DOB:  1961-04-10, LOS: 1 ADMISSION DATE:  04/04/2019, CONSULTATION DATE:  11/12 REFERRING MD:  [facility], CHIEF COMPLAINT:  Nausea/ weakness   BRIEF HISTORY:    77 yowm quit smoking x 10 y with h/o etohism presented am with h/o hematemesis/ melena and dropped hgb from 7 t o 5.6 with difficult IV access so CCM service called  STUDIES:     Hospital events  11/12 admitted w/ working dx of UGIB (with concern for variceal bleed), ETOH related Hepatitis and DKA. Initial hgb dropped from 7 to 5.6 so PCCM asked to see for clinical decline and also concern about IV access. RIght IJ CVL placed. Transfused 3 units total, started on PPI and octreotide. placed on DKA protocol 11/13 stable overnight. Anion gap closed. hgb 8.1 CULTURES:  covid 11/12 >>>neg Urine 11/12 >>> MRSA pcr 11/12 >>> BC x 2 11/12 >>>  ANTIBIOTICS:  Flagyl 11/12 >>> Maxepime 11/12 >>>  LINES/TUBES:  R IJ CVL  11/12 >>>   CONSULTANTS:  GI 11/12 >>> PCCM 11/12 >>>   SUBJECTIVE:   C/o lower left quad abd pain stable over night  objective   CONSTITUTIONAL: Blood Pressure 116/72 (BP Location: Left Arm)   Pulse 90   Temperature 98.1 F (36.7 C) (Oral)   Respiration 17   Height 5\' 6"  (1.676 m)   Weight 92.1 kg   Oxygen Saturation 92%   Body Mass Index 32.77 kg/m  RA  I/O last 3 completed shifts: In: 9850.4 [I.V.:4490; Blood:1076.3; IV Piggyback:4284.1] Out: 950 [Urine:950]  CVP:  [5 mmHg-6 mmHg] 5 mmHg     PHYSICAL EXAM:  General this is a pleasant 58 year old white male resting in bed. Not in distress HENT NCAT no JVD MMM right IJ CVL unremarkable  Pulm clear and w/out accessory use  Card RRR w/out MRG abd soft left LQ abd discomfort + bowel sounds gu cl yellow  neuro intact       ASSESSMENT AND PLAN     life threatening GIB likely upper based on h/o hematemesis w/ concern for varices given h/o hep C and heavy  ETOH -hgb stable overnight s/p total of 3 units PRBC Plan Cont PPI (IV BID) and octreotide per GI For EGD later 11/13 NPO Transfuse for hgb < 7 or on-going active bleeding. Avoid hgb > 10 as will increase portal htn  Serial hgbs  DKA/ lactic acidosis combined disorder (urine ketones 5/ lactate elevate) -anion gap closed.  -lactic acid cleared appropriately in spite of underlying liver disease Plan Cont DKA protocol (now on SSI)  leukocytosis. + SIRS. Do not think he is infected. Does have some pancreatic inflammation   -pct neg -no sig fevers Plan Consider dc abx   Etoh pancreatitis/ hepatitis  Plan Am lipase  Watch for evidence of wd  MIld AKI c/b Fluid and electrolyte imbalance: Hypokalemia,  -scr improving (peaked at 1.83, now down to 1.43) Plan Cont IVFs Replace K  Renal dose meds Strict I&O  Coagulopathy likely due to liver dz -INR was 2.3, did not received reversal  Plan Vit K Will transfuse 2 units of FFP       Best Practice / Goals of Care / Disposition.   DVT PROPHYLAXIS:pas  SUP:ppi NUTRITION:npo MOBILITY:sbr GOALS OF CARE: stablize, w/u GIB FAMILY DISCUSSIONS: n/a  DISPOSITION icu; we will sign off. Did write for ffp. Awaiting EGD later today   Erick Colace ACNP-BC  [facility] Pulmonary/Critical Care Pager # [phone] OR # [phone] if no answer

## 2019-04-06 ENCOUNTER — Inpatient Hospital Stay (HOSPITAL_COMMUNITY): Payer: Self-pay

## 2019-04-06 LAB — COMPREHENSIVE METABOLIC PANEL WITH GFR
ALT: 661 U/L — ABNORMAL HIGH (ref 0–44)
AST: 940 U/L — ABNORMAL HIGH (ref 15–41)
Albumin: 2.8 g/dL — ABNORMAL LOW (ref 3.5–5.0)
Alkaline Phosphatase: 58 U/L (ref 38–126)
Anion gap: 4 — ABNORMAL LOW (ref 5–15)
BUN: 31 mg/dL — ABNORMAL HIGH (ref 6–20)
CO2: 27 mmol/L (ref 22–32)
Calcium: 7.3 mg/dL — ABNORMAL LOW (ref 8.9–10.3)
Chloride: 104 mmol/L (ref 98–111)
Creatinine, Ser: 1.38 mg/dL — ABNORMAL HIGH (ref 0.61–1.24)
GFR calc Af Amer: >60 mL/min
GFR calc non Af Amer: 57 mL/min — ABNORMAL LOW
Glucose, Bld: 158 mg/dL — ABNORMAL HIGH (ref 70–99)
Potassium: 3.3 mmol/L — ABNORMAL LOW (ref 3.5–5.1)
Sodium: 135 mmol/L (ref 135–145)
Total Bilirubin: 2.2 mg/dL — ABNORMAL HIGH (ref 0.3–1.2)
Total Protein: 4.6 g/dL — ABNORMAL LOW (ref 6.5–8.1)

## 2019-04-06 LAB — CBC
HCT: 23 % — ABNORMAL LOW (ref 39.0–52.0)
HCT: 23.8 % — ABNORMAL LOW (ref 39.0–52.0)
Hemoglobin: 7.6 g/dL — ABNORMAL LOW (ref 13.0–17.0)
Hemoglobin: 7.9 g/dL — ABNORMAL LOW (ref 13.0–17.0)
MCH: 29.8 pg (ref 26.0–34.0)
MCH: 29.8 pg (ref 26.0–34.0)
MCHC: 33 g/dL (ref 30.0–36.0)
MCHC: 33.2 g/dL (ref 30.0–36.0)
MCV: 90.2 fL (ref 80.0–100.0)
MCV: 89.8 fL (ref 80.0–100.0)
Platelets: 63 10*3/uL — ABNORMAL LOW (ref 150–400)
Platelets: 66 K/uL — ABNORMAL LOW (ref 150–400)
RBC: 2.55 MIL/uL — ABNORMAL LOW (ref 4.22–5.81)
RBC: 2.65 MIL/uL — ABNORMAL LOW (ref 4.22–5.81)
RDW: 15 % (ref 11.5–15.5)
RDW: 15 % (ref 11.5–15.5)
WBC: 8.3 10*3/uL (ref 4.0–10.5)
WBC: 9.3 K/uL (ref 4.0–10.5)
nRBC: 2.3 % — ABNORMAL HIGH (ref 0.0–0.2)
nRBC: 2 % — ABNORMAL HIGH (ref 0.0–0.2)

## 2019-04-06 LAB — URINE CULTURE: Culture: <10000 — AB

## 2019-04-06 LAB — GLUCOSE, CAPILLARY
Glucose-Capillary: 134 mg/dL — ABNORMAL HIGH (ref 70–99)
Glucose-Capillary: 144 mg/dL — ABNORMAL HIGH (ref 70–99)
Glucose-Capillary: 154 mg/dL — ABNORMAL HIGH (ref 70–99)
Glucose-Capillary: 170 mg/dL — ABNORMAL HIGH (ref 70–99)
Glucose-Capillary: 189 mg/dL — ABNORMAL HIGH (ref 70–99)
Glucose-Capillary: 210 mg/dL — ABNORMAL HIGH (ref 70–99)

## 2019-04-06 LAB — MAGNESIUM: Magnesium: 2 mg/dL (ref 1.7–2.4)

## 2019-04-06 LAB — PROCALCITONIN: Procalcitonin: <0.1 ng/mL

## 2019-04-06 LAB — PROTIME-INR
INR: 1.7 — ABNORMAL HIGH (ref 0.8–1.2)
Prothrombin Time: 20.1 seconds — ABNORMAL HIGH (ref 11.4–15.2)

## 2019-04-06 LAB — PHOSPHORUS: Phosphorus: 1.6 mg/dL — ABNORMAL LOW (ref 2.5–4.6)

## 2019-04-06 MED ORDER — SPIRONOLACTONE 100 MG PO TABS
100.0000 mg | ORAL_TABLET | Freq: Every day | ORAL | Status: DC
  Administered 2019-04-06 – 2019-04-09 (×4): 100 mg via ORAL
  Filled 2019-04-06: qty 1
  Filled 2019-04-06 (×2): qty 4
  Filled 2019-04-06: qty 1

## 2019-04-06 MED ORDER — LIVING WELL WITH DIABETES BOOK
Freq: Once | Status: AC
  Administered 2019-04-06: 17:00:00
  Filled 2019-04-06: qty 1

## 2019-04-06 MED ORDER — K PHOS MONO-SOD PHOS DI & MONO 155-852-130 MG PO TABS
250.0000 mg | ORAL_TABLET | Freq: Three times a day (TID) | ORAL | Status: AC
  Administered 2019-04-06 – 2019-04-07 (×6): 250 mg via ORAL
  Filled 2019-04-06 (×6): qty 1

## 2019-04-06 MED ORDER — INSULIN STARTER KIT- SYRINGES (ENGLISH)
1.0000 | Freq: Once | Status: AC
  Administered 2019-04-06: 1
  Filled 2019-04-06: qty 1

## 2019-04-06 MED ORDER — POTASSIUM CHLORIDE 20 MEQ PO PACK
40.0000 meq | PACK | Freq: Once | ORAL | Status: AC
  Administered 2019-04-06: 40 meq via ORAL
  Filled 2019-04-06: qty 2

## 2019-04-06 MED ORDER — FUROSEMIDE 40 MG PO TABS
40.0000 mg | ORAL_TABLET | Freq: Every day | ORAL | Status: DC
  Administered 2019-04-06 – 2019-04-09 (×4): 40 mg via ORAL
  Filled 2019-04-06 (×4): qty 1

## 2019-04-06 NOTE — Progress Notes (Signed)
PROGRESS NOTE    DIDIER BRANDENBURG  RAQ:[id number] DOB: [birth year] DOA: 04/04/2019 PCP: Enid Skeens., MD   Brief Narrative: Connor Vaughn is a 58 y.o. 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  He reports being homeless and needs a lot of assistance to get around.  He has not had insurance so he reports not taking any of his home medication for last 6 months or probably longer. He reports having hematemesis few days back which has been coffee-ground emesis.  He also reports having black tarry stools since yesterday. He was seen by GI, he was started on octreotide.  He underwent EGD found to have esophagitis without active bleeding from his varices.  Octreotide was discontinued.  DKA has resolved,  sepsis is improving,  renal functions are improving.  His liver enzymes increased/  platelet counts dropped.  GI following   Assessment & Plan:   Active Problems:   DKA (diabetic ketoacidoses) ([place])   Encounter for central line placement   Sepsis with acute renal failure without septic shock (HCC)   Acute blood loss anemia   Pancreatitis   Lactic acidosis   Upper GI bleed   #DKA - Resolved. #Anion gap metabolic acidosis - gap closed.  #Severe sepsis - improving. -Unclear if patient had an underlying infectious trigger though has concerning symptoms and abnormal lab values. he denied chest pain but he has been off of medications for greater than 6 months and without a recent A1c.  Patient without abdominal pain on exam and suspect diarrhea is secondary to bleed. -Continued on DKA protocol.  Anion gap closed. -Blood cultures - , urine cultures- , chest x-ray without opacity. -Procalcitonin 0.28 - Continued empiric antibiotics with  cefepime/Flagyl/vancomycin for three days. - given syncope/severity of illness and need for additional access, Dr. Carlis Abbott of ICU was consulted for assistance, appreciate help in care of this patient  #Upper GI bleed - Resolved. - May be variceal bleeding versus Mallory-Weiss tear versus PUD versus gastritis - Patient's presenting hemoglobin is 7. -  transfuse to maintain hemoglobin greater than 7 if needed. -  S/p transfusion 2 PRBC. - Continued on PPI twice daily and octreotide gtt. -  patient on SBP proph given empiric treatment for sepsis as above - Dr. Benson Norway, GI consulted.  EGD shows esophagitis without bleeding. - Follow-up abdominal ultrasound, PPI twice daily for a month and daily - Check hep C panel  #AKI -improving -Secondary to the above, prerenal,  -Anticipate improvement with fluids of blood products -Monitor I's and O's -Avoid nephrotoxic agents  # Syncope - no prior diagnosis, but concern worsening symptoms presentation this week is due to problems above - continue to monitor on telemetry for arrhythmia, but further work-up pending resolution of above Rx - PT/OT evaluations after acute medical conditions stabilized.  #Cirrhosis -Without encephalopathy, likely secondary to alcohol but has history of hepatitis C status post treatment -Previously on Lasix and spironolactone but has not been on medications due to financial instability.  - Restarted as per GI.   #Elevated lactate - Resolved. -This is likely a combination of patient cirrhosis and inability to metabolize, as well as contributing tissue hypoperfusion from sepsis and DKA and potentially thiamine deficiency/cofactor deficiency -Admission value was greater than 11, will trend  # Transaminitis  -  primarily hepatocellular  -  LFTs appear c/w EtOH pattern, but also possibly hypoperfusion in setting of DKA/Sepsis.   #Alcohol abuse/withdrawal - CIWA protocol, patient's last drink was this _0 0 mg 100 mL/hr over 60 Minutes Intravenous  Once 04/04/19 1139 04/04/19 1630   04/04/19 1145  vancomycin (VANCOCIN) IVPB 1000 mg/200 mL premix  Status:  Discontinued     1,000 mg 200 mL/hr over 60 Minutes Intravenous  Once 04/04/19 1139 04/04/19 1452      Subjective: Patient was seen and examined at bedside, He still complains of mild abdominal pain but denies nausea and vomiting.   He denies any overnight events.  Objective: Vitals:   04/06/19 0100 04/06/19 0500 04/06/19 0800 04/06/19 1200  BP: (!) 149/66 (!)  156/56    Pulse: 95 87    Resp: 17 13    Temp:   97.9 F (36.6 C) 98.5 F (36.9 C)  TempSrc:   Oral Oral  SpO2: 93% (!) 88%    Weight:      Height:        Intake/Output Summary (Last 24 hours) at 04/06/2019 1257 Last data filed at 04/06/2019 0600 Gross per 24 hour  Intake 2422.15 ml  Output 390 ml  Net 2032.15 ml   Filed Weights   04/04/19 1429 04/05/19 0500 04/05/19 1133  Weight: 92 kg 92.1 kg 92 kg    Examination:  General exam: Appears calm and comfortable  Respiratory system: Clear to auscultation. Respiratory effort normal. Cardiovascular system: S1 & S2 heard, RRR. No JVD, murmurs, rubs, gallops or clicks. No pedal edema. Gastrointestinal system: Abdomen is nondistended, soft and mildly tender. No organomegaly or masses felt. Normal bowel sounds heard. Central nervous system: Alert and oriented. No focal neurological deficits. Extremities: Symmetric 5 x 5 power. Skin: No rashes, lesions or ulcers Psychiatry: Judgement and insight appear normal. Mood & affect appropriate.   Data Reviewed: I have personally reviewed following  labs and imaging studies  CBC: Recent Labs  Lab 04/04/19 0948 04/04/19 1340 04/05/19 0152 04/06/19 0700 04/06/19 0816  WBC 35.9* 32.7*  --  8.3 9.3  NEUTROABS 27.0*  --   --   --   --   HGB 7.0* 5.6* 8.1* 7.6* 7.9*  HCT 22.1* 17.5* 23.7* 23.0* 23.8*  MCV 97.4 95.6  --  90.2 89.8  PLT 233 180  --  63* 66*   Basic Metabolic Panel: Recent Labs  Lab 04/04/19 1340 04/04/19 1430 04/04/19 1744 04/05/19 0152 04/05/19 0520 04/06/19 0700  NA 134* 135 135 133* 134* 135  K 3.7 3.6 3.6 3.5 3.3* 3.3*  CL 101 100 104 104 106 104  CO2 14* 13* 21* _0 GLUCOSE 243* 233* 179* 199* 227* 158*  BUN 52* 51* 51* 47* 44* 31*  CREATININE 1.79* 1.83* 1.58* 1.50* 1.43* 1.38*  CALCIUM 7.1* 7.4* 7.1* 6.8* 6.6* 7.3*  MG 1.8  --   --   --   --  2.0  PHOS 2.4*  --   --   --   --  1.6*   GFR: Estimated Creatinine Clearance: 63.5 mL/min (A) (by  C-G formula based on SCr of 1.38 mg/dL (H)). Liver Function Tests: Recent Labs  Lab 04/04/19 0948 04/04/19 1340 04/06/19 0700  AST 426* 641* 940*  ALT 286* 435* 661*  ALKPHOS 59 48 58  BILITOT 2.4* 1.5* 2.2*  PROT 5.1* 4.1* 4.6*  ALBUMIN 3.2* 2.6* 2.8*   Recent Labs  Lab 04/04/19 0948  LIPASE 162*   No results for input(s): AMMONIA in the last 168 hours. Coagulation Profile: Recent Labs  Lab 04/04/19 0948 04/05/19 1945 04/06/19 0700  INR 2.3* 1.8* 1.7*   Cardiac Enzymes: No results for input(s): CKTOTAL, CKMB, CKMBINDEX, TROPONINI in the last 168 hours. BNP (last 3 results) No results for input(s): PROBNP in the last 8760 hours. HbA1C: Recent Labs    04/04/19 0948 04/04/19 1430  HGBA1C 6.1* 6.1*   CBG: Recent Labs  Lab 04/05/19 1708 04/05/19 1926 04/05/19 2339 04/06/19 0736 04/06/19 1116  GLUCAP 313* 260* 151* 154* 210*   Lipid Profile: No results for input(s): CHOL, HDL, LDLCALC, TRIG, CHOLHDL, LDLDIRECT in the last 72 hours. Thyroid Function Tests: No results for input(s): TSH, T4TOTAL, FREET4, T3FREE, THYROIDAB in the last 72 hours. Anemia Panel: No results for input(s): VITAMINB12, FOLATE, FERRITIN, TIBC, IRON, RETICCTPCT in the last 72 hours. Sepsis Labs: Recent Labs  Lab 04/04/19 1221 04/04/19 1430 04/04/19 1447 04/04/19 1733 04/05/19 0520 04/06/19 0700  PROCALCITON  --  0.16  --   --  0.28 <0.10  LATICACIDVEN >11.0*  --  >11.0* 6.0*  --   --     Recent Results (from the past 240 hour(s))  SARS CORONAVIRUS 2 (TAT 6-24 HRS) Nasopharyngeal Urine, Clean Catch     Status: None   Collection Time: 04/04/19 11:28 AM   Specimen: Urine, Clean Catch; Nasopharyngeal  Result Value Ref Range Status   SARS Coronavirus 2 NEGATIVE NEGATIVE Final    Comment: (NOTE) SARS-CoV-2 target nucleic acids are NOT DETECTED. The SARS-CoV-2 RNA is generally detectable in upper and lower respiratory specimens during the acute phase of infection. Negative results  do not preclude SARS-CoV-2 infection, do not rule out co-infections with other pathogens, and should not be used as the sole basis for treatment or other patient management decisions. Negative results must be combined with clinical observations, patient history, and epidemiological information. The expected result is Negative. Fact  Sheet for Patients: SugarRoll.be Fact Sheet for Healthcare Providers: https://www.woods-mathews.com/ This test is not yet approved or cleared by the [place] FDA and  has been authorized for detection and/or diagnosis of SARS-CoV-2 by FDA under an Emergency Use Authorization (EUA). This EUA will remain  in effect (meaning this test can be used) for the duration of the COVID-19 declaration under Section 56 4(b)(1) of the Act, 21 U.S.C. section 360bbb-3(b)(1), unless the authorization is terminated or revoked sooner. Performed at [facility], [place] [address] [place], [place] [address]   Blood Culture (routine x 2)     Status: None (Preliminary result)   Collection Time: 04/04/19 12:21 PM   Specimen: BLOOD  Result Value Ref Range Status   Specimen Description   Final    BLOOD RIGHT WRIST Performed at [facility] [address], [place], [place] [address]    Special Requests   Final    BOTTLES DRAWN AEROBIC ONLY Blood Culture adequate volume Performed at [facility] [address] [place], [place] [address]    Culture   Final    NO GROWTH < 24 HOURS Performed at [facility] [address] [place], [place] [address]    Report Status PENDING  Incomplete  Urine culture     Status: Abnormal   Collection Time: 04/04/19 12:21 PM   Specimen: In/Out Cath Urine  Result Value Ref Range Status   Specimen Description   Final    IN/OUT CATH URINE Performed at [facility] [address] [place], [place] [address]    Special Requests    Final    NONE Performed at [facility], [place] [address] [place], [place] [address]    Culture (A)  Final    <10,000 COLONIES/mL INSIGNIFICANT GROWTH Performed at [facility] [address] [place], [place] [address]    Report Status 04/06/2019 FINAL  Final  MRSA PCR Screening     Status: None   Collection Time: 04/04/19  1:36 PM   Specimen: Nasal Mucosa; Nasopharyngeal  Result Value Ref Range Status   MRSA by PCR NEGATIVE NEGATIVE Final    Comment:        The GeneXpert MRSA Assay (FDA approved for NASAL specimens only), is one component of a comprehensive MRSA colonization surveillance program. It is not intended to diagnose MRSA infection nor to guide or monitor treatment for MRSA infections. Performed at [facility], [place] [address], [place], [place] [address]   Culture, blood (single)     Status: None (Preliminary result)   Collection Time: 04/04/19  2:30 PM   Specimen: BLOOD  Result Value Ref Range Status   Specimen Description   Final    BLOOD LEFT HAND Performed at [facility] [address] [place], [place] [address]    Special Requests   Final    BOTTLES DRAWN AEROBIC ONLY Blood Culture adequate volume Performed at [facility] [address] [place], [place] [address]    Culture   Final    NO GROWTH < 24 HOURS Performed at [facility] [address], [place],  [address]    Report Status PENDING  Incomplete     Radiology Studies: Ct Head Wo Contrast  Result Date: 04/04/2019 CLINICAL DATA:  Intermittent syncope.  Recent fall. EXAM: CT HEAD WITHOUT CONTRAST TECHNIQUE: Contiguous axial images were obtained from the base of the skull through  the vertex without intravenous contrast. COMPARISON:  05/12/2018 FINDINGS: Brain: The brain shows a normal appearance without evidence of malformation, atrophy, old or acute small or large vessel infarction, mass lesion,  hemorrhage, hydrocephalus or extra-axial collection. Vascular: No hyperdense vessel. No evidence of atherosclerotic calcification. Skull: Normal.  No traumatic finding.  No focal bone lesion. Sinuses/Orbits: Sinuses are clear. Orbits appear normal. Mastoids are clear. Other: None significant IMPRESSION: Normal head CT. Electronically Signed   By: Nelson Chimes M.D.   On: 04/04/2019 18:07   Dg Chest Port 1 View  Result Date: 04/04/2019 CLINICAL DATA:  Central line placement. EXAM: PORTABLE CHEST 1 VIEW COMPARISON:  Prior study 04/04/2019. FINDINGS: Right IJ line noted with tip over cavoatrial junction. Mediastinum appears stable. Stable cardiomegaly. No pulmonary venous congestion. Lungs are clear. Low lung volumes. No pleural effusion or pneumothorax. No acute bony abnormality IMPRESSION: Interim placement right IJ line, its tip is at the cavoatrial junction. No evidence of pneumothorax. Electronically Signed   By: [place]   On: 04/04/2019 16:16    Scheduled Meds: . Chlorhexidine Gluconate Cloth  6 each Topical Daily  . folic acid  1 mg Oral Daily  . furosemide  40 mg Oral Daily  . insulin aspart  0-9 Units Subcutaneous Q4H  . insulin glargine  10 Units Subcutaneous Q0600  . insulin starter kit- syringes  1 kit Other Once  . living well with diabetes book   Does not apply Once  . mouth rinse  15 mL Mouth Rinse BID  . multivitamin with minerals  1 tablet Oral Daily  . pantoprazole  40 mg Oral BID  . phosphorus  250 mg Oral TID  . spironolactone  100 mg Oral Daily  . thiamine  100 mg Oral Daily   Or  . thiamine  100 mg Intravenous Daily  . traZODone  25 mg Oral QHS   Continuous Infusions: . sodium chloride 0 mL/hr at 04/05/19 0926  . lactated ringers 75 mL/hr at 04/06/19 0806     LOS: 2 days    Time spent:     Shawna Clamp, MD [facility]   If 7PM-7AM, please contact night-coverage

## 2019-04-06 NOTE — Progress Notes (Signed)
Subjective: Feeling well.  Objective: Vital signs in last 24 hours: Temp:  [97.5 F (36.4 C)-99.7 F (37.6 C)] 97.9 F (36.6 C) (11/14 0800) Pulse Rate:  [32-107] 87 (11/14 0500) Resp:  [13-27] 13 (11/14 0500) BP: (115-166)/(51-106) 156/56 (11/14 0500) SpO2:  [88 %-100 %] 88 % (11/14 0500) Last BM Date: 04/05/19  Intake/Output from previous day: 11/13 0701 - 11/14 0700 In: 2847.5 [I.V.:1580.5; Blood:600; IV Piggyback:667] Out: 715 [Urine:715] Intake/Output this shift: No intake/output data recorded.  General appearance: alert and no distress GI: + ascites Extremities: edema 2+  Lab Results: Recent Labs    04/04/19 1340 04/05/19 0152 04/06/19 0700 04/06/19 0816  WBC 32.7*  --  8.3 9.3  HGB 5.6* 8.1* 7.6* 7.9*  HCT 17.5* 23.7* 23.0* 23.8*  PLT 180  --  63* 66*   BMET Recent Labs    04/05/19 0152 04/05/19 0520 04/06/19 0700  NA 133* 134* 135  K 3.5 3.3* 3.3*  CL 104 106 104  CO2 '23 23 27  ' GLUCOSE 199* 227* 158*  BUN 47* 44* 31*  CREATININE 1.50* 1.43* 1.38*  CALCIUM 6.8* 6.6* 7.3*   LFT Recent Labs    04/06/19 0700  PROT 4.6*  ALBUMIN 2.8*  AST 940*  ALT 661*  ALKPHOS 58  BILITOT 2.2*   PT/INR Recent Labs    04/05/19 1945 04/06/19 0700  LABPROT 20.4* 20.1*  INR 1.8* 1.7*   Hepatitis Panel No results for input(s): HEPBSAG, HCVAB, HEPAIGM, HEPBIGM in the last 72 hours. C-Diff No results for input(s): CDIFFTOX in the last 72 hours. Fecal Lactopherrin No results for input(s): FECLLACTOFRN in the last 72 hours.  Studies/Results: Ct Head Wo Contrast  Result Date: 04/04/2019 CLINICAL DATA:  Intermittent syncope.  Recent fall. EXAM: CT HEAD WITHOUT CONTRAST TECHNIQUE: Contiguous axial images were obtained from the base of the skull through the vertex without intravenous contrast. COMPARISON:  05/12/2018 FINDINGS: Brain: The brain shows a normal appearance without evidence of malformation, atrophy, old or acute small or large vessel infarction,  mass lesion, hemorrhage, hydrocephalus or extra-axial collection. Vascular: No hyperdense vessel. No evidence of atherosclerotic calcification. Skull: Normal.  No traumatic finding.  No focal bone lesion. Sinuses/Orbits: Sinuses are clear. Orbits appear normal. Mastoids are clear. Other: None significant IMPRESSION: Normal head CT. Electronically Signed   By: Nelson Chimes M.D.   On: 04/04/2019 18:07   Dg Chest Port 1 View  Result Date: 04/04/2019 CLINICAL DATA:  Central line placement. EXAM: PORTABLE CHEST 1 VIEW COMPARISON:  Prior study 04/04/2019. FINDINGS: Right IJ line noted with tip over cavoatrial junction. Mediastinum appears stable. Stable cardiomegaly. No pulmonary venous congestion. Lungs are clear. Low lung volumes. No pleural effusion or pneumothorax. No acute bony abnormality IMPRESSION: Interim placement right IJ line, its tip is at the cavoatrial junction. No evidence of pneumothorax. Electronically Signed   By: Marcello Moores  Register   On: 04/04/2019 16:16   Dg Chest Port 1 View  Result Date: 04/04/2019 CLINICAL DATA:  Sepsis. EXAM: PORTABLE CHEST 1 VIEW COMPARISON:  05/12/2018. FINDINGS: Mediastinum and hilar structures normal. Lungs are clear. No pleural effusion or pneumothorax. Heart size normal. No acute bony abnormality. IMPRESSION: 1.  Cardiomegaly.  No pulmonary venous congestion. 2.  No focal infiltrate. Electronically Signed   By: Marcello Moores  Register   On: 04/04/2019 12:59    Medications:  Scheduled: . Chlorhexidine Gluconate Cloth  6 each Topical Daily  . folic acid  1 mg Oral Daily  . insulin aspart  0-9 Units Subcutaneous  Q4H  . insulin glargine  10 Units Subcutaneous Q0600  . insulin starter kit- syringes  1 kit Other Once  . living well with diabetes book   Does not apply Once  . mouth rinse  15 mL Mouth Rinse BID  . multivitamin with minerals  1 tablet Oral Daily  . pantoprazole  40 mg Oral BID  . phosphorus  250 mg Oral TID  . thiamine  100 mg Oral Daily   Or  .  thiamine  100 mg Intravenous Daily  . traZODone  25 mg Oral QHS   Continuous: . sodium chloride 0 mL/hr at 04/05/19 0926  . ceFEPime (MAXIPIME) IV Stopped (04/06/19 0129)  . lactated ringers 75 mL/hr at 04/06/19 0806  . metronidazole 100 mL/hr at 04/06/19 0600  . vancomycin Stopped (04/05/19 1617)    Assessment/Plan: 1) LA grad D esophagitis. 2) Large esophageal varices. 3) ETOH cirrhosis. 4) Ascites.   The patient does not have bleeding from his varices.  The esophagitis was the source and this can be healed with a PPI BID x 1 month and then QD.  The question will be if he needs prophylaxis for his large varices in the future.  There is also evidence of abdominal ascites.  Plan: 1) Start Step I diuretics. 2) Continue with 2 gram sodium diet. 3) Continue with pantoprazole. 4) Check HCV RNA.  This will be for end of treatment evaluation.  LOS: 2 days   Raed Schalk D 04/06/2019, 11:55 AM

## 2019-04-06 NOTE — Progress Notes (Signed)
Inpatient Diabetes Program Recommendations  AACE/ADA: New Consensus Statement on Inpatient Glycemic Control (2015)  Target Ranges:  Prepandial:   less than 140 mg/dL      Peak postprandial:   less than 180 mg/dL (1-2 hours)      Critically ill patients:  140 - 180 mg/dL   Lab Results  Component Value Date   GLUCAP 154 (H) 04/06/2019   HGBA1C 6.1 (H) 04/04/2019    Review of Glycemic Control  Diabetes history: DM2 Outpatient Diabetes medications: None (previously on glipizide 5 mg QD) Current orders for Inpatient glycemic control: Lantus 10 units QD, Novolog 0-9 units Q4H  HgbA1C - 6.1%, although likely not accurate with very low H/H CBGs past 24H: 214, 245, 313, 260, 151. 154, 158 this am.   Inpatient Diabetes Program Recommendations:     Add CHO mod med to 2 gram sodium diet.  Increase Lantus to 12 units QD Continue Novolog 0-9 units Q4H until po intake improves, then tidwc and 0-5 units QHS May need small amount of meal coverage if post-prandials elevated.   For discharge: recommend Novolin 70/30 (ReliOn brand for $25) Will order insulin starter kit and have RN teach pt to inject his own insulin. Will also need prescription for blood glucose meter and supplies, along with syringes for insulin. Needs PCP for f/u and diabetes management.  Have attempted to reach pt by telephone to discuss his diabetes and going home on insulin. No answer. Will try again later on today.   Secure text to RN regarding insulin starter kit and allowing pt to give his own insulin and stick finger for blood sugar checks. Will also order Living Well book.  Continue to follow closely.  Thank you. Lorenda Peck, RD, LDN, CDE Inpatient Diabetes Coordinator [phone]

## 2019-04-07 DIAGNOSIS — E1310 Other specified diabetes mellitus with ketoacidosis without coma: Secondary | ICD-10-CM

## 2019-04-07 LAB — PREPARE FRESH FROZEN PLASMA
Unit division: 0
Unit division: 0

## 2019-04-07 LAB — COMPREHENSIVE METABOLIC PANEL
ALT: 667 U/L — ABNORMAL HIGH (ref 0–44)
AST: 763 U/L — ABNORMAL HIGH (ref 15–41)
Albumin: 2.8 g/dL — ABNORMAL LOW (ref 3.5–5.0)
Alkaline Phosphatase: 72 U/L (ref 38–126)
Anion gap: 7 (ref 5–15)
BUN: 22 mg/dL — ABNORMAL HIGH (ref 6–20)
CO2: 28 mmol/L (ref 22–32)
Calcium: 7.6 mg/dL — ABNORMAL LOW (ref 8.9–10.3)
Chloride: 101 mmol/L (ref 98–111)
Creatinine, Ser: 1.24 mg/dL (ref 0.61–1.24)
GFR calc Af Amer: >60 mL/min (ref >60)
GFR calc non Af Amer: >60 mL/min (ref >60)
Glucose, Bld: 160 mg/dL — ABNORMAL HIGH (ref 70–99)
Potassium: 3.1 mmol/L — ABNORMAL LOW (ref 3.5–5.1)
Sodium: 136 mmol/L (ref 135–145)
Total Bilirubin: 2.1 mg/dL — ABNORMAL HIGH (ref 0.3–1.2)
Total Protein: 4.7 g/dL — ABNORMAL LOW (ref 6.5–8.1)

## 2019-04-07 LAB — BPAM FFP
Blood Product Expiration Date: 202011182359
Blood Product Expiration Date: 202011182359
ISSUE DATE / TIME: 202011131358
ISSUE DATE / TIME: 202011131653
Unit Type and Rh: 5100
Unit Type and Rh: 9500

## 2019-04-07 LAB — CBC
HCT: 23.9 % — ABNORMAL LOW (ref 39.0–52.0)
Hemoglobin: 7.8 g/dL — ABNORMAL LOW (ref 13.0–17.0)
MCH: 29.4 pg (ref 26.0–34.0)
MCHC: 32.6 g/dL (ref 30.0–36.0)
MCV: 90.2 fL (ref 80.0–100.0)
Platelets: 62 10*3/uL — ABNORMAL LOW (ref 150–400)
RBC: 2.65 MIL/uL — ABNORMAL LOW (ref 4.22–5.81)
RDW: 14.7 % (ref 11.5–15.5)
WBC: 6.4 10*3/uL (ref 4.0–10.5)
nRBC: 1.4 % — ABNORMAL HIGH (ref 0.0–0.2)

## 2019-04-07 LAB — GLUCOSE, CAPILLARY
Glucose-Capillary: 168 mg/dL — ABNORMAL HIGH (ref 70–99)
Glucose-Capillary: 134 mg/dL — ABNORMAL HIGH (ref 70–99)
Glucose-Capillary: 213 mg/dL — ABNORMAL HIGH (ref 70–99)
Glucose-Capillary: 257 mg/dL — ABNORMAL HIGH (ref 70–99)
Glucose-Capillary: 196 mg/dL — ABNORMAL HIGH (ref 70–99)

## 2019-04-07 LAB — HCV RNA QUANT: HCV Quantitative: NOT DETECTED IU/mL (ref >50)

## 2019-04-07 LAB — PHOSPHORUS: Phosphorus: 2.4 mg/dL — ABNORMAL LOW (ref 2.5–4.6)

## 2019-04-07 MED ORDER — HYOSCYAMINE SULFATE 0.125 MG/5ML PO ELIX
0.2500 mg | ORAL_SOLUTION | Freq: Once | ORAL | Status: AC
  Administered 2019-04-08: 0.25 mg via ORAL
  Filled 2019-04-07: qty 2

## 2019-04-07 NOTE — Progress Notes (Signed)
Subjective: Vomited last evening, but he is feeling better now.  Objective: Vital signs in last 24 hours: Temp:  [97.3 F (36.3 C)-98.5 F (36.9 C)] 98.3 F (36.8 C) (11/15 0741) Pulse Rate:  [85-110] 86 (11/15 1100) Resp:  [12-27] 12 (11/15 1100) BP: (111-167)/(66-89) 130/73 (11/15 1100) SpO2:  [95 %-97 %] 96 % (11/15 1100) Last BM Date: 04/06/19  Intake/Output from previous day: 11/14 0701 - 11/15 0700 In: 1925.9 [P.O.:240; I.V.:1600.6; IV Piggyback:85.2] Out: 3200 [Urine:2950; Stool:250] Intake/Output this shift: Total I/O In: 190 [I.V.:190] Out: -   General appearance: alert and no distress GI: soft, non-tender; bowel sounds normal; no masses,  no organomegaly  Lab Results: Recent Labs    04/06/19 0700 04/06/19 0816 04/07/19 0547  WBC 8.3 9.3 6.4  HGB 7.6* 7.9* 7.8*  HCT 23.0* 23.8* 23.9*  PLT 63* 66* 62*   BMET Recent Labs    04/05/19 0520 04/06/19 0700 04/07/19 0547  NA 134* 135 136  K 3.3* 3.3* 3.1*  CL 106 104 101  CO2 23 27 28   GLUCOSE 227* 158* 160*  BUN 44* 31* 22*  CREATININE 1.43* 1.38* 1.24  CALCIUM 6.6* 7.3* 7.6*   LFT Recent Labs    04/07/19 0547  PROT 4.7*  ALBUMIN 2.8*  AST 763*  ALT 667*  ALKPHOS 72  BILITOT 2.1*   PT/INR Recent Labs    04/05/19 1945 04/06/19 0700  LABPROT 20.4* 20.1*  INR 1.8* 1.7*   Hepatitis Panel No results for input(s): HEPBSAG, HCVAB, HEPAIGM, HEPBIGM in the last 72 hours. C-Diff No results for input(s): CDIFFTOX in the last 72 hours. Fecal Lactopherrin No results for input(s): FECLLACTOFRN in the last 72 hours.  Studies/Results: 04/08/19 Abdomen Complete  Result Date: 04/06/2019 CLINICAL DATA:  Ascites.  History of alcohol abuse and hepatitis C. EXAM: ABDOMEN ULTRASOUND COMPLETE COMPARISON:  12/26/2015 FINDINGS: Gallbladder: No gallstones or wall thickening visualized. No sonographic Murphy sign noted by sonographer. Common bile duct: Diameter: 3.3 mm Liver: Diffuse increased echogenicity liver  with coarse heterogeneous echotexture and irregular contour. The liver appears small. Findings consistent with cirrhosis. No focal hepatic lesions are identified. Portal vein is patent on color Doppler imaging with normal direction of blood flow towards the liver. IVC: Normal caliber. Pancreas: Sonographically unremarkable. Spleen: Normal size.  Maximum length is 12.0 cm. Right Kidney: Length: 11.0 cm. Normal renal cortical thickness and echogenicity without focal lesions or hydronephrosis. Left Kidney: Length: 10.5 cm. Normal renal cortical thickness and echogenicity without focal lesions or hydronephrosis. Abdominal aorta: Normal caliber Other findings: Right-sided pleural effusion is noted. IMPRESSION: 1. Cirrhotic changes involving the liver but no definite hepatic lesions. 2. Normal directional flow in the portal vein. 3. Moderate-sized right pleural effusion is noted. 4. Unremarkable sonographic appearance of the pancreas, spleen and both kidneys. 5. No ascites. Electronically Signed   By: 02/25/2016 M.Vaughn.   On: 04/06/2019 16:05    Medications:  Scheduled: . Chlorhexidine Gluconate Cloth  6 each Topical Daily  . folic acid  1 mg Oral Daily  . furosemide  40 mg Oral Daily  . insulin aspart  0-9 Units Subcutaneous Q4H  . insulin glargine  10 Units Subcutaneous Q0600  . mouth rinse  15 mL Mouth Rinse BID  . multivitamin with minerals  1 tablet Oral Daily  . pantoprazole  40 mg Oral BID  . phosphorus  250 mg Oral TID  . spironolactone  100 mg Oral Daily  . thiamine  100 mg Oral Daily   Or  .  thiamine  100 mg Intravenous Daily  . traZODone  25 mg Oral QHS   Continuous: . sodium chloride 0 mL/hr at 04/05/19 0926  . lactated ringers 75 mL/hr at 04/07/19 9030    Assessment/Plan: 1) LA Grade Vaughn esophagitis. 2) Large esophageal varices. 3) ETOH cirrhosis. 4) ETOH hepatitis. 5) Hepatic hydrothorax.   Unclear why he vomited last evening, but there was no hematemesis.  His HGB is stable.   His liver enzymes are trending downwards again as well as his INR.  The abdominal ultrasound was negative for abdominal ascites, but he has a right pleural effusion.  This is consistent with his prior history of hepatic hydrothorax.  Plan: 1) Continue with diuretics. 2) Daily weights. 3) Continue with 2 gram sodium diet. 4) Continue with PPI.  LOS: 3 days   Connor Vaughn 04/07/2019, 11:46 AM

## 2019-04-07 NOTE — Progress Notes (Signed)
PROGRESS NOTE  Connor BUELOW ZOX:[id number] DOB: 1961-03-14 DOA: 04/04/2019 PCP: Nonnie Done., MD  HPI/Recap of past 14 hours: 58 year old male with medical history significant for alcohol abuse with hepatitis C cirrhosis status post treatment for hep C, type 2 diabetes mellitus, syncope, alcohol abuse, he presented with fatigue and fall.  With hematemesis and melena presented with DKA upper GI bleed AKI and concerning for possible sepsis he reported being homeless and needs a lot of assistance to get around.  He also reported that he lost his insurance and was not able to continue his diabetic treatment the way he used to because it was well controlled previously he reported black tarry stool he has been seen by GI who started him on octreotide he had endoscopy April 05, 2019.  His DKA is improving his sepsis improving renal function is improving  Patient seen and examined at bedside he stated he is doing much better no nausea or vomiting complaining of left flank pain from where he fell when he passed out  Assessment/Plan: Active Problems:   DKA (diabetic ketoacidoses) (HCC)   Encounter for central line placement   Sepsis with acute renal failure without septic shock (HCC)   Acute blood loss anemia   Pancreatitis   Lactic acidosis   Upper GI bleed  1.  DKA a is resolved  2.  Severe sepsis is resolving he is be continued on empiric antibiotic with cefepime Flagyl and vancomycin.  3.  Syncope may have been from his GI bleed with acute blood loss anemia or possibly septic shock patient received 2 unit of packed RBC  4.  Upper GI bleed from esophageal varices Patient is currently on octreotide and IV PPI he is currently on spontaneous bacterial peritonitis prophylaxis with empiric antibiotic as above is being seen by Dr. Elnoria Howard he underwent EGD upper endoscopy on April 05, 2019.  Dr. Elnoria Howard stated he does not have bleeding from his varices and esophagitis was the source of his  bleed and can be healed with PPI twice daily for 1 month and then daily his diagnosis is LA grade D esophagitis Large esophageal varices Alcohol cirrhosis Ascites.  He recommended to start step 1 diuretics and continue 2 g salt diet  Code Status: Full  Severity of Illness: The appropriate patient status for this patient is INPATIENT. Inpatient status is judged to be reasonable and necessary in order to provide the required intensity of service to ensure the patient's safety. The patient's presenting symptoms, physical exam findings, and initial radiographic and laboratory data in the context of their chronic comorbidities is felt to place them at high risk for further clinical deterioration. Furthermore, it is not anticipated that the patient will be medically stable for discharge from the hospital within 2 midnights of admission. The following factors support the patient status of inpatient.   " The patient's presenting symptoms include GI bleed and DKA. " The worrisome physical exam findings include GI bleed. " The initial radiographic and laboratory data are worrisome because of esophageal varices esophagitis. " The chronic co-morbidities include alcohol abuse alcohol cirrhosis ascites.   * I certify that at the point of admission it is my clinical judgment that the patient will require inpatient hospital care spanning beyond 2 midnights from the point of admission due to high intensity of service, high risk for further deterioration and high frequency of surveillance required.*    Family Communication: None at bedside  Disposition Plan: Home when stable   Consultants:  Strength right G  Procedures:  EGD on April 05, 2019  Antimicrobials:  Cefepime vancomycin and Flagyl  DVT prophylaxis: SCD   Objective: Vitals:   04/07/19 1300 04/07/19 1400 04/07/19 1500 04/07/19 1700  BP: (!) 146/71 (!) 146/74 132/68 (!) 147/86  Pulse: (!) 102 90 100 94  Resp: (!) 22 18 16 18    Temp:      TempSrc:      SpO2: (!) 82% 96% 99% 98%  Weight:      Height:        Intake/Output Summary (Last 24 hours) at 04/07/2019 1852 Last data filed at 04/07/2019 1810 Gross per 24 hour  Intake 2003.88 ml  Output 2000 ml  Net 3.88 ml   Filed Weights   04/04/19 1429 04/05/19 0500 04/05/19 1133  Weight: 92 kg 92.1 kg 92 kg   Body mass index is 32.74 kg/m.  Exam:  . General: 58 y.o. year-old male well developed well nourished in no acute distress.  Alert and oriented x3. . Cardiovascular: Regular rate and rhythm with no rubs or gallops.  No thyromegaly or JVD noted.   Marland Kitchen Respiratory: Clear to auscultation with no wheezes or rales. Good inspiratory effort. . Abdomen: Soft nontender nondistended with normal bowel sounds x4 quadrants. . Musculoskeletal: No lower extremity edema. 2/4 pulses in all 4 extremities. . Skin: No ulcerative lesions noted or rashes, . Psychiatry: Mood is appropriate for condition and setting    Data Reviewed: CBC: Recent Labs  Lab 04/04/19 0948 04/04/19 1340 04/05/19 0152 04/06/19 0700 04/06/19 0816 04/07/19 0547  WBC 35.9* 32.7*  --  8.3 9.3 6.4  NEUTROABS 27.0*  --   --   --   --   --   HGB 7.0* 5.6* 8.1* 7.6* 7.9* 7.8*  HCT 22.1* 17.5* 23.7* 23.0* 23.8* 23.9*  MCV 97.4 95.6  --  90.2 89.8 90.2  PLT 233 180  --  63* 66* 62*   Basic Metabolic Panel: Recent Labs  Lab 04/04/19 1340  04/04/19 1744 04/05/19 0152 04/05/19 0520 04/06/19 0700 04/07/19 0547  NA 134*   < > 135 133* 134* 135 136  K 3.7   < > 3.6 3.5 3.3* 3.3* 3.1*  CL 101   < > 104 104 106 104 101  CO2 14*   < > 21* 23 23 27 28   GLUCOSE 243*   < > 179* 199* 227* 158* 160*  BUN 52*   < > 51* 47* 44* 31* 22*  CREATININE 1.79*   < > 1.58* 1.50* 1.43* 1.38* 1.24  CALCIUM 7.1*   < > 7.1* 6.8* 6.6* 7.3* 7.6*  MG 1.8  --   --   --   --  2.0  --   PHOS 2.4*  --   --   --   --  1.6* 2.4*   < > = values in this interval not displayed.   GFR: Estimated Creatinine Clearance:  70.7 mL/min (by C-G formula based on SCr of 1.24 mg/dL). Liver Function Tests: Recent Labs  Lab 04/04/19 0948 04/04/19 1340 04/06/19 0700 04/07/19 0547  AST 426* 641* 940* 763*  ALT 286* 435* 661* 667*  ALKPHOS 59 48 58 72  BILITOT 2.4* 1.5* 2.2* 2.1*  PROT 5.1* 4.1* 4.6* 4.7*  ALBUMIN 3.2* 2.6* 2.8* 2.8*   Recent Labs  Lab 04/04/19 0948  LIPASE 162*   No results for input(s): AMMONIA in the last 168 hours. Coagulation Profile: Recent Labs  Lab 04/04/19 0948 04/05/19 1945 04/06/19  0700  INR 2.3* 1.8* 1.7*   Cardiac Enzymes: No results for input(s): CKTOTAL, CKMB, CKMBINDEX, TROPONINI in the last 168 hours. BNP (last 3 results) No results for input(s): PROBNP in the last 8760 hours. HbA1C: No results for input(s): HGBA1C in the last 72 hours. CBG: Recent Labs  Lab 04/06/19 2334 04/07/19 0333 04/07/19 0734 04/07/19 1113 04/07/19 1800  GLUCAP 144* 168* 134* 213* 257*   Lipid Profile: No results for input(s): CHOL, HDL, LDLCALC, TRIG, CHOLHDL, LDLDIRECT in the last 72 hours. Thyroid Function Tests: No results for input(s): TSH, T4TOTAL, FREET4, T3FREE, THYROIDAB in the last 72 hours. Anemia Panel: No results for input(s): VITAMINB12, FOLATE, FERRITIN, TIBC, IRON, RETICCTPCT in the last 72 hours. Urine analysis:    Component Value Date/Time   COLORURINE YELLOW 04/04/2019 0911   APPEARANCEUR CLEAR 04/04/2019 0911   LABSPEC 1.017 04/04/2019 0911   PHURINE 5.0 04/04/2019 0911   GLUCOSEU >=500 (A) 04/04/2019 0911   HGBUR NEGATIVE 04/04/2019 0911   BILIRUBINUR NEGATIVE 04/04/2019 0911   KETONESUR 5 (A) 04/04/2019 0911   PROTEINUR NEGATIVE 04/04/2019 0911   NITRITE NEGATIVE 04/04/2019 0911   LEUKOCYTESUR NEGATIVE 04/04/2019 0911   Sepsis Labs: @LABRCNTIP (procalcitonin:4,lacticidven:4)  ) Recent Results (from the past 240 hour(s))  SARS CORONAVIRUS 2 (TAT 6-24 HRS) Nasopharyngeal Urine, Clean Catch     Status: None   Collection Time: 04/04/19 11:28 AM    Specimen: Urine, Clean Catch; Nasopharyngeal  Result Value Ref Range Status   SARS Coronavirus 2 NEGATIVE NEGATIVE Final    Comment: (NOTE) SARS-CoV-2 target nucleic acids are NOT DETECTED. The SARS-CoV-2 RNA is generally detectable in upper and lower respiratory specimens during the acute phase of infection. Negative results do not preclude SARS-CoV-2 infection, do not rule out co-infections with other pathogens, and should not be used as the sole basis for treatment or other patient management decisions. Negative results must be combined with clinical observations, patient history, and epidemiological information. The expected result is Negative. Fact Sheet for Patients: 13/12/20 Fact Sheet for Healthcare Providers: HairSlick.no This test is not yet approved or cleared by the quierodirigir.com FDA and  has been authorized for detection and/or diagnosis of SARS-CoV-2 by FDA under an Emergency Use Authorization (EUA). This EUA will remain  in effect (meaning this test can be used) for the duration of the COVID-19 declaration under Section 56 4(b)(1) of the Act, 21 U.S.C. section 360bbb-3(b)(1), unless the authorization is terminated or revoked sooner. Performed at [facility], [address] [place], [place]   Blood Culture (routine x 2)     Status: None (Preliminary result)   Collection Time: 04/04/19 12:21 PM   Specimen: BLOOD  Result Value Ref Range Status   Specimen Description   Final    BLOOD RIGHT WRIST Performed at [facility], [address] [place], [place]    Special Requests   Final    BOTTLES DRAWN AEROBIC ONLY Blood Culture adequate volume Performed at [facility], [address], [place], [place]    Culture   Final    NO GROWTH 3 DAYS Performed at [facility], [address], [place], [place]    Report  Status PENDING  Incomplete  Urine culture     Status: Abnormal   Collection Time: 04/04/19 12:21 PM   Specimen: In/Out Cath Urine  Result Value Ref Range Status   Specimen Description   Final    IN/OUT CATH URINE  Performed at [facility], [address] [place], [place] [address]    Special Requests   Final    NONE Performed at [facility], [address] [place], [place] [address]    Culture (A)  Final    <10,000 COLONIES/mL INSIGNIFICANT GROWTH Performed at [facility], [address] [place], [place] [address]    Report Status 04/06/2019 FINAL  Final  MRSA PCR Screening     Status: None   Collection Time: 04/04/19  1:36 PM   Specimen: Nasal Mucosa; Nasopharyngeal  Result Value Ref Range Status   MRSA by PCR NEGATIVE NEGATIVE Final    Comment:        The GeneXpert MRSA Assay (FDA approved for NASAL specimens only), is one component of a comprehensive MRSA colonization surveillance program. It is not intended to diagnose MRSA infection nor to guide or monitor treatment for MRSA infections. Performed at [facility], [address] [place], [place] [address]   Culture, blood (single)     Status: None (Preliminary result)   Collection Time: 04/04/19  2:30 PM   Specimen: BLOOD  Result Value Ref Range Status   Specimen Description   Final    BLOOD LEFT HAND Performed at [facility], [address] [place], [place] [address]    Special Requests   Final    BOTTLES DRAWN AEROBIC ONLY Blood Culture adequate volume Performed at [facility] [place] [facility], [address] [place], [place] [address]    Culture   Final    NO GROWTH 3 DAYS Performed at [facility], [address] [place], [place] [address]    Report Status PENDING  Incomplete      Studies: No results found.  Scheduled Meds: . Chlorhexidine Gluconate Cloth  6 each Topical Daily  . folic acid  1 mg  Oral Daily  . furosemide  40 mg Oral Daily  . insulin aspart  0-9 Units Subcutaneous Q4H  . insulin glargine  10 Units Subcutaneous Q0600  . mouth rinse  15 mL Mouth Rinse BID  . multivitamin with minerals  1 tablet Oral Daily  . pantoprazole  40 mg Oral BID  . phosphorus  250 mg Oral TID  . spironolactone  100 mg Oral Daily  . thiamine  100 mg Oral Daily   Or  . thiamine  100 mg Intravenous Daily  . traZODone  25 mg Oral QHS    Continuous Infusions: . sodium chloride 0 mL/hr at 04/05/19 0926  . lactated ringers 75 mL/hr at 04/07/19 1810     LOS: 3 days     Myrtie NeitherNwannadiya Sherril Shipman, MD [facility]  To reach me or the doctor on call, go to: [email or web address]  04/07/2019, 6:52 PM

## 2019-04-08 ENCOUNTER — Encounter (HOSPITAL_COMMUNITY): Payer: Self-pay | Admitting: Gastroenterology

## 2019-04-08 DIAGNOSIS — E1165 Type 2 diabetes mellitus with hyperglycemia: Secondary | ICD-10-CM

## 2019-04-08 DIAGNOSIS — R748 Abnormal levels of other serum enzymes: Secondary | ICD-10-CM

## 2019-04-08 DIAGNOSIS — R0602 Shortness of breath: Secondary | ICD-10-CM

## 2019-04-08 DIAGNOSIS — M7918 Myalgia, other site: Secondary | ICD-10-CM

## 2019-04-08 DIAGNOSIS — F1020 Alcohol dependence, uncomplicated: Secondary | ICD-10-CM

## 2019-04-08 DIAGNOSIS — R6510 Systemic inflammatory response syndrome (SIRS) of non-infectious origin without acute organ dysfunction: Secondary | ICD-10-CM

## 2019-04-08 DIAGNOSIS — D696 Thrombocytopenia, unspecified: Secondary | ICD-10-CM

## 2019-04-08 DIAGNOSIS — D689 Coagulation defect, unspecified: Secondary | ICD-10-CM

## 2019-04-08 DIAGNOSIS — N5089 Other specified disorders of the male genital organs: Secondary | ICD-10-CM

## 2019-04-08 DIAGNOSIS — R296 Repeated falls: Secondary | ICD-10-CM

## 2019-04-08 DIAGNOSIS — D500 Iron deficiency anemia secondary to blood loss (chronic): Secondary | ICD-10-CM

## 2019-04-08 DIAGNOSIS — R5381 Other malaise: Secondary | ICD-10-CM

## 2019-04-08 DIAGNOSIS — K7030 Alcoholic cirrhosis of liver without ascites: Secondary | ICD-10-CM

## 2019-04-08 DIAGNOSIS — E876 Hypokalemia: Secondary | ICD-10-CM

## 2019-04-08 LAB — COMPREHENSIVE METABOLIC PANEL
ALT: 550 U/L — ABNORMAL HIGH (ref 0–44)
AST: 470 U/L — ABNORMAL HIGH (ref 15–41)
Albumin: 2.9 g/dL — ABNORMAL LOW (ref 3.5–5.0)
Alkaline Phosphatase: 79 U/L (ref 38–126)
Anion gap: 8 (ref 5–15)
BUN: 19 mg/dL (ref 6–20)
CO2: 30 mmol/L (ref 22–32)
Calcium: 7.6 mg/dL — ABNORMAL LOW (ref 8.9–10.3)
Chloride: 97 mmol/L — ABNORMAL LOW (ref 98–111)
Creatinine, Ser: 1.1 mg/dL (ref 0.61–1.24)
GFR calc Af Amer: >60 mL/min (ref >60)
GFR calc non Af Amer: >60 mL/min (ref >60)
Glucose, Bld: 145 mg/dL — ABNORMAL HIGH (ref 70–99)
Potassium: 3.2 mmol/L — ABNORMAL LOW (ref 3.5–5.1)
Sodium: 135 mmol/L (ref 135–145)
Total Bilirubin: 1.7 mg/dL — ABNORMAL HIGH (ref 0.3–1.2)
Total Protein: 4.9 g/dL — ABNORMAL LOW (ref 6.5–8.1)

## 2019-04-08 LAB — GLUCOSE, CAPILLARY
Glucose-Capillary: 119 mg/dL — ABNORMAL HIGH (ref 70–99)
Glucose-Capillary: 131 mg/dL — ABNORMAL HIGH (ref 70–99)
Glucose-Capillary: 136 mg/dL — ABNORMAL HIGH (ref 70–99)
Glucose-Capillary: 213 mg/dL — ABNORMAL HIGH (ref 70–99)
Glucose-Capillary: 213 mg/dL — ABNORMAL HIGH (ref 70–99)
Glucose-Capillary: 271 mg/dL — ABNORMAL HIGH (ref 70–99)

## 2019-04-08 LAB — VITAMIN B12: Vitamin B-12: 1142 pg/mL — ABNORMAL HIGH (ref 180–914)

## 2019-04-08 LAB — CBC
HCT: 25.7 % — ABNORMAL LOW (ref 39.0–52.0)
Hemoglobin: 8.4 g/dL — ABNORMAL LOW (ref 13.0–17.0)
MCH: 29.9 pg (ref 26.0–34.0)
MCHC: 32.7 g/dL (ref 30.0–36.0)
MCV: 91.5 fL (ref 80.0–100.0)
Platelets: 66 10*3/uL — ABNORMAL LOW (ref 150–400)
RBC: 2.81 MIL/uL — ABNORMAL LOW (ref 4.22–5.81)
RDW: 15.7 % — ABNORMAL HIGH (ref 11.5–15.5)
WBC: 5.8 10*3/uL (ref 4.0–10.5)
nRBC: 0 % (ref 0.0–0.2)

## 2019-04-08 LAB — IRON AND TIBC
Iron: 24 ug/dL — ABNORMAL LOW (ref 45–182)
Saturation Ratios: 9 % — ABNORMAL LOW (ref 17.9–39.5)
TIBC: 274 ug/dL (ref 250–450)
UIBC: 250 ug/dL

## 2019-04-08 LAB — FOLATE: Folate: 17.9 ng/mL (ref >5.9)

## 2019-04-08 LAB — PHOSPHORUS: Phosphorus: 2.6 mg/dL (ref 2.5–4.6)

## 2019-04-08 LAB — RETICULOCYTES
Immature Retic Fract: 39.4 % — ABNORMAL HIGH (ref 2.3–15.9)
RBC.: 2.88 MIL/uL — ABNORMAL LOW (ref 4.22–5.81)
Retic Count, Absolute: 214 10*3/uL — ABNORMAL HIGH (ref 19.0–186.0)
Retic Ct Pct: 7.4 % — ABNORMAL HIGH (ref 0.4–3.1)

## 2019-04-08 LAB — BRAIN NATRIURETIC PEPTIDE: B Natriuretic Peptide: 52.2 pg/mL (ref 0.0–100.0)

## 2019-04-08 LAB — FERRITIN: Ferritin: 196 ng/mL (ref 24–336)

## 2019-04-08 LAB — MAGNESIUM: Magnesium: 2.1 mg/dL (ref 1.7–2.4)

## 2019-04-08 MED ORDER — SENNA 8.6 MG PO TABS: 1.0000 | ORAL_TABLET | Freq: Every day | ORAL | Status: DC | PRN

## 2019-04-08 MED ORDER — LIDOCAINE 5 % EX PTCH
1.0000 | MEDICATED_PATCH | CUTANEOUS | Status: DC
  Administered 2019-04-08 – 2019-04-13 (×5): 1 via TRANSDERMAL
  Filled 2019-04-08 (×9): qty 1

## 2019-04-08 MED ORDER — BACITRACIN-NEOMYCIN-POLYMYXIN OINTMENT TUBE
TOPICAL_OINTMENT | Freq: Two times a day (BID) | CUTANEOUS | Status: DC
  Administered 2019-04-08 – 2019-04-10 (×6): via TOPICAL
  Administered 2019-04-11: 1 via TOPICAL
  Administered 2019-04-12 – 2019-04-15 (×8): via TOPICAL
  Administered 2019-04-16: 1 via TOPICAL
  Filled 2019-04-08: qty 14.17

## 2019-04-08 MED ORDER — LORAZEPAM 1 MG PO TABS: 1.0000 mg | ORAL_TABLET | ORAL | Status: AC | PRN

## 2019-04-08 MED ORDER — SODIUM CHLORIDE 0.9 % IV SOLN
510.0000 mg | Freq: Once | INTRAVENOUS | Status: AC
  Administered 2019-04-08: 510 mg via INTRAVENOUS
  Filled 2019-04-08: qty 17

## 2019-04-08 MED ORDER — POTASSIUM CHLORIDE CRYS ER 20 MEQ PO TBCR
40.0000 meq | EXTENDED_RELEASE_TABLET | ORAL | Status: AC
  Administered 2019-04-08 (×2): 40 meq via ORAL
  Filled 2019-04-08 (×2): qty 2

## 2019-04-08 MED ORDER — POLYETHYLENE GLYCOL 3350 17 G PO PACK
17.0000 g | PACK | Freq: Every day | ORAL | Status: DC
  Administered 2019-04-12 – 2019-04-16 (×3): 17 g via ORAL
  Filled 2019-04-08 (×9): qty 1

## 2019-04-08 MED ORDER — FERROUS SULFATE 325 (65 FE) MG PO TABS
325.0000 mg | ORAL_TABLET | Freq: Two times a day (BID) | ORAL | Status: DC
  Administered 2019-04-08 – 2019-04-16 (×16): 325 mg via ORAL
  Filled 2019-04-08 (×16): qty 1

## 2019-04-08 MED ORDER — FENTANYL CITRATE (PF) 100 MCG/2ML IJ SOLN
25.0000 ug | INTRAMUSCULAR | Status: DC | PRN
  Administered 2019-04-08 – 2019-04-14 (×9): 25 ug via INTRAVENOUS
  Filled 2019-04-08 (×9): qty 2

## 2019-04-08 MED ORDER — LORAZEPAM 2 MG/ML IJ SOLN
1.0000 mg | INTRAMUSCULAR | Status: AC | PRN
  Administered 2019-04-08: 4 mg via INTRAVENOUS
  Filled 2019-04-08: qty 2

## 2019-04-08 NOTE — Progress Notes (Signed)
PROGRESS NOTE  Connor Vaughn UKG:[id number] DOB: 20-Dec-1960   PCP: Nonnie Done., MD  Patient is from: Home.Lives with his wife.  Progressive ambulatory dysfunction.   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  He had relapse of alcohol use.  Reportedly drinks 1/5 of whiskey.  No alcohol or recreational drug use.  Patient was admitted for DKA, SIRS with multiple organ failure without clear source of infection, upper GI bleed, AKI, syncope, alcohol abuse/withdrawal.  PCCM and GI consulted. Empirically started on broad-spectrum antibiotics.  Also started on IV insulin and IV fluid hydration.  Had EGD on 11/13 that revealed LA grade D reflux esophagitis with no bleeding, large (> 5 mm) esophageal varices and portal hypertensive gastropathy.  Per GI, bleeding was presumed to be from gastritis versus varices.  He was treated with octreotide drip and PPI.   Subjective: No major events overnight of this morning.  Endorses left-sided chest/abdominal pain from fall.  Pain worse with cough and deep breathing.  Noticed some swelling particularly in his scrotum.  Reports some shortness of breath.  Denies chest pain.  Denies headache, vision change, focal neuro symptoms.  Denies UTI symptoms.  Objective: Vitals:   04/07/19 1700 04/07/19 1927 04/08/19 0003 04/08/19 0534  BP: (!) 147/86  126/75 131/84  Pulse: 94  91 92  Resp: 18  16 16   Temp:  (!) 97.4 F (36.3 C) 98.4 F (36.9 C) 97.9 F (36.6 C)  TempSrc:  Oral Oral Oral  SpO2: 98%  99% 98%  Weight:   101.1 kg   Height:   5\' 6"  (1.676 m)     Intake/Output Summary (Last 24 hours)  at 04/08/2019 1113 Last data filed at 04/08/2019 1000 Gross per 24 hour  Intake 1520.97 ml  Output 2400 ml  Net -879.03 ml   Filed Weights   04/05/19 0500 04/05/19 1133 04/08/19 0003  Weight: 92.1 kg 92 kg 101.1 kg    Examination:  GENERAL: No acute distress.  Appears well.  HEENT: MMM.  Sclera anicteric NECK: Supple.  No apparent JVD.  RESP:  No IWOB.  Fair air movement bilaterally CVS:  RRR. Heart sounds normal.  ABD/GI/GU: Bowel sounds present. Soft. Non tender.  Significant scrotal swelling/edema MSK/EXT:  No apparent deformity. Moves extremities.  Trace/1+ pitting edema bilaterally SKIN: Skin bruise over right frontal head, right elbow NEURO: Awake, alert and oriented appropriately.  No gross deficit.  PSYCH: Calm. Normal affect.  Assessment & Plan: Acute on chronic blood loss anemia due to upper GI bleed: Hematemesis and melena -Hgb 15 (04/2018)> 7.0 (admit)> 5.6>2u> 8.1> 7.8> 8.4 -Anemia panel consistent with severe iron deficiency with iron saturation of 9% -EGD on 11/13-LA grade D reflux esophagitis with no bleeding, large (> 5 mm) EV and PHG.   -Per GI, bleeding was presumed to be from gastritis versus varices -GI following-off octreotide.  Continue PPI twice daily for 1 month, then daily. -Encourage alcohol cessation-not ready for rehab.  Will provide resources -IV Feraheme 510 mg once and p.o. iron with good bowel regimen  History of EtOH/hepatitis C cirrhosis-s/p treatment for hepatitis C Coagulopathy/thrombocytopenia-likely due to alcohol and cirrhosis-improving Elevated liver enzymes/hyperbilirubinemia-likely due to alcohol +/-ischemia-improving Alcohol use disorder -Continue CIWA, lab monitoring -  Multivitamins, thiamine and folic acid -We will provide resources for alcohol cessation -Continue Lasix and Aldactone -Discontinue IV fluid  Fluid overload with a scrotal edema: Likely due to cirrhosis and hypoalbuminemia.  He echo in 2017 with EF of 65 to 70% but  indeterminate diastolic dysfunction.  Reports some shortness of breath and cough -Discontinue IV fluid -Diuretics as above -Monitor fluid status.  If no improvement, will consider echocardiogram -Scrotal sling -Incentive spirometry and as needed pain medication including Lidoderm patch  SIRS with multiple organ failure/injury: Infectious work-up including COVID-19, blood culture and urine culture not revealing.  No signs of SBP. -Empirically treated with broad-spectrum antibiotics (vancomycin, cefepime and Flagyl) 11/12-11/14  -Continue monitoring  DKA/uncontrolled DM-2 with hyperglycemia: A1c 6.1% on 11/12 -Continue current regimen with CBG monitoring -Hold off statin due to hepatitis  Hypokalemia: Likely due to diuretics and IV fluid.  Magnesium was normal -Discontinue IV fluid -Replenish and recheck  AKI: Likely due to dehydration.  Resolved. -Continue monitoring  Noncompliance with medications: reportedly lost his insurance and has been able to afford? -Counseled -Consult case management  Morbid obesity: BMI 35.97 -Encourage lifestyle change to lose weight  Recurrent fall at home/syncope/physical deconditioning/debility: Likely due to alcohol with his comorbidities as above -Fall precautions -Treat treatable causes as above -PT/OT eval                DVT prophylaxis: SCD in the setting of GI bleed and thrombocytopenia Code Status: Full code Family Communication: Patient and/or RN. Available if any question. Disposition Plan: Remains inpatient Consultants: PCCM (signed off), GI  Procedures:  11/13-EGD as above  Microbiology summarized: 11/12-COVID-19 screen negative 11/12-blood cultures negative 11/12-urine culture with insignificant gross  Sch Meds:  Scheduled Meds: . Chlorhexidine Gluconate Cloth  6 each Topical Daily  . folic acid  1 mg Oral Daily  . furosemide  40 mg Oral Daily  . insulin aspart  0-9 Units Subcutaneous Q4H  . insulin glargine  10  Units Subcutaneous Q0600  . lidocaine  1 patch Transdermal Q24H  . mouth rinse  15 mL Mouth Rinse BID  . multivitamin with minerals  1 tablet Oral Daily  . neomycin-bacitracin-polymyxin   Topical BID  . pantoprazole  40 mg Oral BID  . spironolactone  100 mg Oral Daily  . thiamine  100 mg Oral Daily   Or  . thiamine  100 mg Intravenous Daily  . traZODone  25 mg Oral QHS   Continuous Infusions: PRN Meds:.fentaNYL (SUBLIMAZE) injection, labetalol, ondansetron (ZOFRAN) IV  Antimicrobials: Anti-infectives (From admission, onward)   Start     Dose/Rate Route Frequency Ordered Stop   04/05/19 1500  vancomycin (VANCOCIN) IVPB 1000 mg/200 mL premix  Status:  Discontinued     1,000 mg 200 mL/hr over 60 Minutes Intravenous Every 24 hours 04/04/19 1452 04/06/19 1223   04/05/19 0100  ceFEPIme (MAXIPIME) 2 g in sodium chloride 0.9 % 100 mL IVPB  Status:  Discontinued     2 g 200 mL/hr over 30 Minutes Intravenous Every 12 hours 04/04/19 1452 04/06/19 1223   04/04/19 2200  metroNIDAZOLE (FLAGYL) IVPB 500 mg  Status:  Discontinued     500 mg 100 mL/hr over 60 Minutes Intravenous Every 8 hours 04/04/19 1332 04/06/19 1223   04/04/19 1500  vancomycin (VANCOCIN) 2,000 mg in sodium chloride 0.9 % 500 mL IVPB     2,000 mg 250 mL/hr over 120 Minutes Intravenous NOW 04/04/19 1452 04/04/19 1707   04/04/19 1145  ceFEPIme (MAXIPIME) 2 g  in sodium chloride 0.9 % 100 mL IVPB     2 g 200 mL/hr over 30 Minutes Intravenous  Once 04/04/19 1139 04/04/19 1354   04/04/19 1145  metroNIDAZOLE (FLAGYL) IVPB 500 mg     500 mg 100 mL/hr over 60 Minutes Intravenous  Once 04/04/19 1139 04/04/19 1630   04/04/19 1145  vancomycin (VANCOCIN) IVPB 1000 mg/200 mL premix  Status:  Discontinued     1,000 mg 200 mL/hr over 60 Minutes Intravenous  Once 04/04/19 1139 04/04/19 1452       I have personally reviewed the following labs and images: CBC: Recent Labs  Lab 04/04/19 0948 04/04/19 1340 04/05/19 0152 04/06/19 0700  04/06/19 0816 04/07/19 0547 04/08/19 0807  WBC 35.9* 32.7*  --  8.3 9.3 6.4 5.8  NEUTROABS 27.0*  --   --   --   --   --   --   HGB 7.0* 5.6* 8.1* 7.6* 7.9* 7.8* 8.4*  HCT 22.1* 17.5* 23.7* 23.0* 23.8* 23.9* 25.7*  MCV 97.4 95.6  --  90.2 89.8 90.2 91.5  PLT 233 180  --  63* 66* 62* 66*   BMP &GFR Recent Labs  Lab 04/04/19 1340  04/05/19 0152 04/05/19 0520 04/06/19 0700 04/07/19 0547 04/08/19 0807  NA 134*   < > 133* 134* 135 136 135  K 3.7   < > 3.5 3.3* 3.3* 3.1* 3.2*  CL 101   < > 104 106 104 101 97*  CO2 14*   < > GLUCOSE 243*   < > 199* 227* 158* 160* 145*  BUN 52*   < > 47* 44* 31* 22* 19  CREATININE 1.79*   < > 1.50* 1.43* 1.38* 1.24 1.10  CALCIUM 7.1*   < > 6.8* 6.6* 7.3* 7.6* 7.6*  MG 1.8  --   --   --  2.0  --  2.1  PHOS 2.4*  --   --   --  1.6* 2.4* 2.6   < > = values in this interval not displayed.   Estimated Creatinine Clearance: 83.5 mL/min (by C-G formula based on SCr of 1.1 mg/dL). Liver & Pancreas: Recent Labs  Lab 04/04/19 0948 04/04/19 1340 04/06/19 0700 04/07/19 0547 04/08/19 0807  AST 426* 641* 940* 763* 470*  ALT 286* 435* 661* 667* 550*  ALKPHOS 59 48 58 72 79  BILITOT 2.4* 1.5* 2.2* 2.1* 1.7*  PROT 5.1* 4.1* 4.6* 4.7* 4.9*  ALBUMIN 3.2* 2.6* 2.8* 2.8* 2.9*   Recent Labs  Lab 04/04/19 0948  LIPASE 162*   No results for input(s): AMMONIA in the last 168 hours. Diabetic: No results for input(s): HGBA1C in the last 72 hours. Recent Labs  Lab 04/07/19 1800 04/07/19 1925 04/08/19 0005 04/08/19 0616 04/08/19 0806  GLUCAP 257* 196* 119* 131* 136*   Cardiac Enzymes: No results for input(s): CKTOTAL, CKMB, CKMBINDEX, TROPONINI in the last 168 hours. No results for input(s): PROBNP in the last 8760 hours. Coagulation Profile: Recent Labs  Lab 04/04/19 0948 04/05/19 1945 04/06/19 0700  INR 2.3* 1.8* 1.7*   Thyroid Function Tests: No results for input(s): TSH, T4TOTAL, FREET4, T3FREE, THYROIDAB in the last 72  hours. Lipid Profile: No results for input(s): CHOL, HDL, LDLCALC, TRIG, CHOLHDL, LDLDIRECT in the last 72 hours. Anemia Panel: Recent Labs    04/08/19 0807  FOLATE 17.9  FERRITIN 196  TIBC 274  IRON 24*  RETICCTPCT 7.4*   Urine analysis:    Component Value Date/Time  COLORURINE YELLOW 04/04/2019 0911   APPEARANCEUR CLEAR 04/04/2019 0911   LABSPEC 1.017 04/04/2019 0911   PHURINE 5.0 04/04/2019 0911   GLUCOSEU >=500 (A) 04/04/2019 0911   HGBUR NEGATIVE 04/04/2019 0911   BILIRUBINUR NEGATIVE 04/04/2019 0911   KETONESUR 5 (A) 04/04/2019 0911   PROTEINUR NEGATIVE 04/04/2019 0911   NITRITE NEGATIVE 04/04/2019 0911   LEUKOCYTESUR NEGATIVE 04/04/2019 0911   Sepsis Labs: Invalid input(s): PROCALCITONIN, LACTICIDVEN  Microbiology: Recent Results (from the past 240 hour(s))  SARS CORONAVIRUS 2 (TAT 6-24 HRS) Nasopharyngeal Urine, Clean Catch     Status: None   Collection Time: 04/04/19 11:28 AM   Specimen: Urine, Clean Catch; Nasopharyngeal  Result Value Ref Range Status   SARS Coronavirus 2 NEGATIVE NEGATIVE Final    Comment: (NOTE) SARS-CoV-2 target nucleic acids are NOT DETECTED. The SARS-CoV-2 RNA is generally detectable in upper and lower respiratory specimens during the acute phase of infection. Negative results do not preclude SARS-CoV-2 infection, do not rule out co-infections with other pathogens, and should not be used as the sole basis for treatment or other patient management decisions. Negative results must be combined with clinical observations, patient history, and epidemiological information. The expected result is Negative. Fact Sheet for Patients: HairSlick.no Fact Sheet for Healthcare Providers: quierodirigir.com This test is not yet approved or cleared by the [place] FDA and  has been authorized for detection and/or diagnosis of SARS-CoV-2 by FDA under an Emergency Use Authorization (EUA). This  EUA will remain  in effect (meaning this test can be used) for the duration of the COVID-19 declaration under Section 56 4(b)(1) of the Act, 21 U.S.C. section 360bbb-3(b)(1), unless the authorization is terminated or revoked sooner. Performed at [facility], [address], [place], [place] [address]   Blood Culture (routine x 2)     Status: None (Preliminary result)   Collection Time: 04/04/19 12:21 PM   Specimen: BLOOD  Result Value Ref Range Status   Specimen Description   Final    BLOOD RIGHT WRIST Performed at [facility], [address] [place], [place] [address]    Special Requests   Final    BOTTLES DRAWN AEROBIC ONLY Blood Culture adequate volume Performed at [facility], [address] [place], [place] [address]    Culture   Final    NO GROWTH 4 DAYS Performed at [facility], [address], [place], [place] [address]    Report Status PENDING  Incomplete  Urine culture     Status: Abnormal   Collection Time: 04/04/19 12:21 PM   Specimen: In/Out Cath Urine  Result Value Ref Range Status   Specimen Description   Final    IN/OUT CATH URINE Performed at [facility], [address] [place], [place] [address]    Special Requests   Final    NONE Performed at [facility], [address], [place], [place] [address]    Culture (A)  Final    <10,000 COLONIES/mL INSIGNIFICANT GROWTH Performed at [facility], [address] [place], [place] [address]    Report Status 04/06/2019 FINAL  Final  MRSA PCR Screening     Status: None   Collection Time: 04/04/19  1:36 PM   Specimen: Nasal Mucosa; Nasopharyngeal  Result Value Ref Range Status   MRSA by PCR NEGATIVE NEGATIVE Final    Comment:        The GeneXpert MRSA Assay (FDA approved for NASAL  specimens only), is one component of a comprehensive MRSA colonization surveillance program. It is not intended to diagnose  MRSA infection nor to guide or monitor treatment for MRSA infections. Performed at [facility], [place] [address] [place], [place] [address]   Culture, blood (single)     Status: None (Preliminary result)   Collection Time: 04/04/19  2:30 PM   Specimen: BLOOD  Result Value Ref Range Status   Specimen Description   Final    BLOOD LEFT HAND Performed at [facility] [address], [place], [place] [address]    Special Requests   Final    BOTTLES DRAWN AEROBIC ONLY Blood Culture adequate volume Performed at [place] [address] [place], [place] [address]    Culture   Final    NO GROWTH 4 DAYS Performed at [facility], [place] [address], [place], [place] [address]    Report Status PENDING  Incomplete    Radiology Studies: No results found.  55 minutes with more than 50% spent in reviewing records, counseling patient/family and coordinating care.  Taye T. Orrum  If 7PM-7AM, please contact night-coverage [email or web address] Password [email or web address] 04/08/2019, 11:13 AM

## 2019-04-08 NOTE — Evaluation (Signed)
Physical Therapy Evaluation Patient Details Name: Connor Vaughn MRN: [id number] DOB: 05-17-1961 Today's Date: 04/08/2019   History of Present Illness  Pt is 58 yo male with hx of ETOH/hepatitis C cirrhosis, DM2, syncope, and ETOH abuse.  He was admitted with fatigue, falls, hematemesis and melena. Pt admitted with severe sepsis, upper GI bleed, and AKI. He had EGD on 11/13 which revealed esophagitis and esophagel varices and abdominal ascites.  Clinical Impression   Pt admitted with above diagnosis. Pt was able to ambulate short distance with unsteadiness, SHOB, and HR up to 130 bpm.  Required assist of 2 for safety as pt fatigued easily and reported hx of syncope.  Pt was limited some by scrotal edema. Pt only has intermittent support at home.  He is motivated to improve in hopes to return home at d/c; however, may need more assist if endurance does not improve as he is currently a fall risk.  Pt currently with functional limitations due to the deficits listed below (see PT Problem List). Pt will benefit from skilled PT to increase their independence and safety with mobility to allow discharge to the venue listed below.       Follow Up Recommendations SNF ; hopeful for improvement to HHPT level    Equipment Recommendations  Rolling walker with 5" wheels    Recommendations for Other Services       Precautions / Restrictions Precautions Precautions: Fall      Mobility  Bed Mobility Overal bed mobility: Needs Assistance Bed Mobility: Supine to Sit;Sit to Supine     Supine to sit: Min assist Sit to supine: Min assist;HOB elevated   General bed mobility comments: increased time; rest breaks; pt SHOB  Transfers Overall transfer level: Needs assistance Equipment used: Rolling walker (2 wheeled) Transfers: Sit to/from Stand Sit to Stand: Min assist;+2 safety/equipment;From elevated surface         General transfer comment: sit to stand x 3; toielting ADLs mod A  ;  Ambulation/Gait Ambulation/Gait assistance: Min assist;+2 physical assistance Gait Distance (Feet): 12 Feet Assistive device: Rolling walker (2 wheeled) Gait Pattern/deviations: Wide base of support;Step-through pattern;Decreased stride length;Staggering right;Staggering left     General Gait Details: 12'x2; rest breaks; unsteady; cues for safety  Stairs            Wheelchair Mobility    Modified Rankin (Stroke Patients Only)       Balance Overall balance assessment: Needs assistance Sitting-balance support: Bilateral upper extremity supported;Feet supported Sitting balance-Leahy Scale: Good     Standing balance support: Bilateral upper extremity supported;During functional activity Standing balance-Leahy Scale: Poor                               Pertinent Vitals/Pain Pain Assessment: No/denies pain    Home Living Family/patient expects to be discharged to:: Private residence   Available Help at Discharge: Family;Friend(s);Available PRN/intermittently Type of Home: Mobile home Home Access: Stairs to enter Entrance Stairs-Rails: None Entrance Stairs-Number of Steps: 2 Home Layout: One level Home Equipment: None      Prior Function Level of Independence: Independent         Comments: Pt reports independent with short community ambulation and ADLs.  Per chart, pt was having difficulty with ambulation and frequent falls.     Hand Dominance        Extremity/Trunk Assessment   Upper Extremity Assessment Upper Extremity Assessment: Generalized weakness    Lower Extremity  Assessment Lower Extremity Assessment: RLE deficits/detail;LLE deficits/detail RLE Deficits / Details: ROM WFL; Strength 4/5 RLE Sensation: decreased light touch(in R lateral thigh) LLE Deficits / Details: ROM WFL; Strength 4/5 LLE Sensation: decreased light touch(In L lateral thigh)       Communication      Cognition Arousal/Alertness: Awake/alert Behavior  During Therapy: WFL for tasks assessed/performed Overall Cognitive Status: Within Functional Limits for tasks assessed                                 General Comments: Pt w/ c/o uncomfortableness from swollen scrotum, but no pain      General Comments General comments (skin integrity, edema, etc.): Pt with fatigue and SHOB with activity.  O2 sats stable 94-96% on 2 LPM.  HR 100 bpm up to 128-130 bpm activity    Exercises     Assessment/Plan    PT Assessment Patient needs continued PT services  PT Problem List Decreased strength;Decreased mobility;Decreased safety awareness;Decreased activity tolerance;Cardiopulmonary status limiting activity;Decreased balance;Decreased knowledge of use of DME;Impaired sensation       PT Treatment Interventions DME instruction;Therapeutic activities;Gait training;Therapeutic exercise;Patient/family education;Balance training;Functional mobility training;Stair training;Neuromuscular re-education    PT Goals (Current goals can be found in the Care Plan section)  Acute Rehab PT Goals Patient Stated Goal: return home with intermittent assist if possible PT Goal Formulation: With patient Time For Goal Achievement: 04/22/19 Potential to Achieve Goals: Good    Frequency Min 3X/week   Barriers to discharge Decreased caregiver support      Co-evaluation               AM-PAC PT "6 Clicks" Mobility  Outcome Measure Help needed turning from your back to your side while in a flat bed without using bedrails?: A Little Help needed moving from lying on your back to sitting on the side of a flat bed without using bedrails?: A Little Help needed moving to and from a bed to a chair (including a wheelchair)?: A Little Help needed standing up from a chair using your arms (e.g., wheelchair or bedside chair)?: A Little Help needed to walk in hospital room?: A Little Help needed climbing 3-5 steps with a railing? : A Lot 6 Click Score: 17     End of Session Equipment Utilized During Treatment: Gait belt Activity Tolerance: Patient limited by fatigue Patient left: in chair;with chair alarm set;with call bell/phone within reach Nurse Communication: Mobility status PT Visit Diagnosis: Unsteadiness on feet (R26.81);Muscle weakness (generalized) (M62.81);History of falling (Z91.81)    Time: 4166-0630 PT Time Calculation (min) (ACUTE ONLY): 39 min   Charges:   PT Evaluation $PT Eval Moderate Complexity: 1 Mod          Maggie Font, PT Acute Rehab Services Pager [phone] [facility] [phone] [facility] [phone]   Karlton Lemon 04/08/2019, 1:58 PM

## 2019-04-09 DIAGNOSIS — R05 Cough: Secondary | ICD-10-CM

## 2019-04-09 DIAGNOSIS — R1012 Left upper quadrant pain: Secondary | ICD-10-CM

## 2019-04-09 DIAGNOSIS — M792 Neuralgia and neuritis, unspecified: Secondary | ICD-10-CM

## 2019-04-09 LAB — PHOSPHORUS: Phosphorus: 2.9 mg/dL (ref 2.5–4.6)

## 2019-04-09 LAB — CULTURE, BLOOD (ROUTINE X 2)
Culture: NO GROWTH
Special Requests: ADEQUATE

## 2019-04-09 LAB — COMPREHENSIVE METABOLIC PANEL
ALT: 395 U/L — ABNORMAL HIGH (ref 0–44)
AST: 244 U/L — ABNORMAL HIGH (ref 15–41)
Albumin: 3 g/dL — ABNORMAL LOW (ref 3.5–5.0)
Alkaline Phosphatase: 84 U/L (ref 38–126)
Anion gap: 7 (ref 5–15)
BUN: 18 mg/dL (ref 6–20)
CO2: 30 mmol/L (ref 22–32)
Calcium: 8.1 mg/dL — ABNORMAL LOW (ref 8.9–10.3)
Chloride: 100 mmol/L (ref 98–111)
Creatinine, Ser: 1.2 mg/dL (ref 0.61–1.24)
GFR calc Af Amer: >60 mL/min (ref >60)
GFR calc non Af Amer: >60 mL/min (ref >60)
Glucose, Bld: 167 mg/dL — ABNORMAL HIGH (ref 70–99)
Potassium: 3.8 mmol/L (ref 3.5–5.1)
Sodium: 137 mmol/L (ref 135–145)
Total Bilirubin: 1.1 mg/dL (ref 0.3–1.2)
Total Protein: 4.9 g/dL — ABNORMAL LOW (ref 6.5–8.1)

## 2019-04-09 LAB — CBC
HCT: 26.9 % — ABNORMAL LOW (ref 39.0–52.0)
Hemoglobin: 8.5 g/dL — ABNORMAL LOW (ref 13.0–17.0)
MCH: 29.2 pg (ref 26.0–34.0)
MCHC: 31.6 g/dL (ref 30.0–36.0)
MCV: 92.4 fL (ref 80.0–100.0)
Platelets: 77 10*3/uL — ABNORMAL LOW (ref 150–400)
RBC: 2.91 MIL/uL — ABNORMAL LOW (ref 4.22–5.81)
RDW: 15.7 % — ABNORMAL HIGH (ref 11.5–15.5)
WBC: 6 10*3/uL (ref 4.0–10.5)
nRBC: 0.5 % — ABNORMAL HIGH (ref 0.0–0.2)

## 2019-04-09 LAB — GLUCOSE, CAPILLARY
Glucose-Capillary: 155 mg/dL — ABNORMAL HIGH (ref 70–99)
Glucose-Capillary: 187 mg/dL — ABNORMAL HIGH (ref 70–99)
Glucose-Capillary: 190 mg/dL — ABNORMAL HIGH (ref 70–99)
Glucose-Capillary: 197 mg/dL — ABNORMAL HIGH (ref 70–99)
Glucose-Capillary: 201 mg/dL — ABNORMAL HIGH (ref 70–99)
Glucose-Capillary: 211 mg/dL — ABNORMAL HIGH (ref 70–99)
Glucose-Capillary: 246 mg/dL — ABNORMAL HIGH (ref 70–99)

## 2019-04-09 LAB — LIPASE, BLOOD: Lipase: 73 U/L — ABNORMAL HIGH (ref 11–51)

## 2019-04-09 LAB — CULTURE, BLOOD (SINGLE)
Culture: NO GROWTH
Special Requests: ADEQUATE

## 2019-04-09 LAB — MAGNESIUM: Magnesium: 2.1 mg/dL (ref 1.7–2.4)

## 2019-04-09 LAB — CK: Total CK: 97 U/L (ref 49–397)

## 2019-04-09 LAB — PROTIME-INR
INR: 1.2 (ref 0.8–1.2)
Prothrombin Time: 15.5 seconds — ABNORMAL HIGH (ref 11.4–15.2)

## 2019-04-09 MED ORDER — SPIRONOLACTONE 100 MG PO TABS
200.0000 mg | ORAL_TABLET | Freq: Every day | ORAL | Status: DC
  Administered 2019-04-10 – 2019-04-12 (×3): 200 mg via ORAL
  Filled 2019-04-09 (×3): qty 2

## 2019-04-09 MED ORDER — DM-GUAIFENESIN ER 30-600 MG PO TB12
1.0000 | ORAL_TABLET | Freq: Two times a day (BID) | ORAL | Status: DC
  Administered 2019-04-09 – 2019-04-16 (×11): 1 via ORAL
  Filled 2019-04-09 (×15): qty 1

## 2019-04-09 MED ORDER — INSULIN GLARGINE 100 UNIT/ML ~~LOC~~ SOLN
13.0000 [IU] | Freq: Every day | SUBCUTANEOUS | Status: DC
  Administered 2019-04-10 – 2019-04-15 (×6): 13 [IU] via SUBCUTANEOUS
  Filled 2019-04-09 (×7): qty 0.13

## 2019-04-09 MED ORDER — FUROSEMIDE 40 MG PO TABS
80.0000 mg | ORAL_TABLET | Freq: Every day | ORAL | Status: DC
  Administered 2019-04-10 – 2019-04-11 (×2): 80 mg via ORAL
  Filled 2019-04-09 (×2): qty 2

## 2019-04-09 MED ORDER — IPRATROPIUM-ALBUTEROL 0.5-2.5 (3) MG/3ML IN SOLN
3.0000 mL | Freq: Four times a day (QID) | RESPIRATORY_TRACT | Status: DC | PRN
  Administered 2019-04-09 – 2019-04-11 (×3): 3 mL via RESPIRATORY_TRACT
  Filled 2019-04-09 (×3): qty 3

## 2019-04-09 MED ORDER — INSULIN STARTER KIT- PEN NEEDLES (ENGLISH)
1.0000 | Freq: Once | Status: AC
  Administered 2019-04-09: 1
  Filled 2019-04-09: qty 1

## 2019-04-09 MED ORDER — GABAPENTIN 300 MG PO CAPS
300.0000 mg | ORAL_CAPSULE | Freq: Two times a day (BID) | ORAL | Status: DC
  Administered 2019-04-09 – 2019-04-10 (×3): 300 mg via ORAL
  Filled 2019-04-09 (×3): qty 1

## 2019-04-09 NOTE — Evaluation (Signed)
Occupational Therapy Evaluation Patient Details Name: Connor Vaughn MRN: [id number] DOB: 01/17/1961 Today's Date: 04/09/2019    History of Present Illness Pt is 58 yo male with hx of ETOH/hepatitis C cirrhosis, DM2, syncope, and ETOH abuse.  He was admitted with fatigue, falls, hematemesis and melena. Pt admitted with severe sepsis, upper GI bleed, and AKI. He had EGD on 11/13 which revealed esophagitis and esophagel varices and abdominal ascites.   Clinical Impression   Pt was admitted for the above. He reports that he is independent with adls at baseline, but he has had frequent falls/syncope.  Pt gets no notice when this happens. He was limited by back, side and scrotal pain for LB adls. Will follow in acute setting with supervision level goals.  Pt would benefit from AE:  Will educate him on this next visit    Follow Up Recommendations  Supervision/Assistance - 24 hour    Equipment Recommendations  (? 3;1 )    Recommendations for Other Services       Precautions / Restrictions Precautions Precautions: Fall Restrictions Weight Bearing Restrictions: No      Mobility Bed Mobility         Supine to sit: Min guard Sit to supine: Min guard   General bed mobility comments: extra time and effort.  Had him get up via sidelying, but it did not help back pain  Transfers   Equipment used: Rolling walker (2 wheeled)   Sit to Stand: Min assist         General transfer comment: light assist to rise and steady from higher bed.  Stood x 3 and ambulated just 3 feet in 3 directions with 02 on    Balance                                           ADL either performed or assessed with clinical judgement   ADL Overall ADL's : Needs assistance/impaired                         Toilet Transfer: Minimal assistance Toilet Transfer Details (indicate cue type and reason): simulated bed           General ADL Comments: based on clinical  juidgment, pt needs set up for UB adls and mod A for LB adls.  RN reports that she has ordered a sling for his scrotom.  He is limited both by pain and by wheezing/increased wob.  Educated on AE:  will bring wide sock aide for him next visit.  He would benefit from 3:1 over his toilet     Vision         Perception     Praxis      Pertinent Vitals/Pain Pain Assessment: 0-10 Pain Score: 9  Pain Location: back, L side and groin Pain Descriptors / Indicators: Aching Pain Intervention(s): Limited activity within patient's tolerance;Monitored during session;Repositioned;Patient requesting pain meds-RN notified     Hand Dominance     Extremity/Trunk Assessment Upper Extremity Assessment Upper Extremity Assessment: Generalized weakness           Communication Communication Communication: No difficulties   Cognition Arousal/Alertness: Awake/alert Behavior During Therapy: WFL for tasks assessed/performed Overall Cognitive Status: Within Functional Limits for tasks assessed  General Comments  pt reported lightheadedness in standing/taking a few steps.  Sitting BP 145/97, standing 128/87 and could not stand 3 min for last BP.  Had lightheadedness with this    Exercises     Shoulder Instructions      Home Living Family/patient expects to be discharged to:: Private residence Living Arrangements: Spouse/significant other Available Help at Discharge: Family;Friend(s);Available PRN/intermittently Type of Home: Mobile home Home Access: Stairs to enter Entrance Stairs-Number of Steps: 2         Bathroom Shower/Tub: Teacher, early years/pre: Standard     Home Equipment: None          Prior Functioning/Environment Level of Independence: Independent        Comments: Pt reports independent with short community ambulation and ADLs.  Per chart, pt was having difficulty with ambulation and frequent falls.         OT Problem List: Decreased strength;Decreased activity tolerance;Impaired balance (sitting and/or standing);Decreased knowledge of use of DME or AE;Decreased knowledge of precautions;Pain      OT Treatment/Interventions: Self-care/ADL training;Energy conservation;DME and/or AE instruction;Patient/family education;Balance training;Therapeutic activities    OT Goals(Current goals can be found in the care plan section) Acute Rehab OT Goals Patient Stated Goal: get better and go home OT Goal Formulation: With patient Time For Goal Achievement: 04/23/19 Potential to Achieve Goals: Good ADL Goals Pt Will Transfer to Toilet: with supervision;ambulating;bedside commode;regular height toilet(vs) Additional ADL Goal #1: pt will perform adl with set up./supervision using ae as needed for energy conservation and to decrease back pain Additional ADL Goal #2: pt will gather clothes with RW with supervision using 02 as needed  OT Frequency: Min 2X/week   Barriers to D/C:            Co-evaluation              AM-PAC OT "6 Clicks" Daily Activity     Outcome Measure Help from another person eating meals?: None Help from another person taking care of personal grooming?: A Little Help from another person toileting, which includes using toliet, bedpan, or urinal?: A Little Help from another person bathing (including washing, rinsing, drying)?: A Lot Help from another person to put on and taking off regular upper body clothing?: A Little Help from another person to put on and taking off regular lower body clothing?: A Lot 6 Click Score: 17   End of Session Nurse Communication: (pain meds, sling, BPs)  Activity Tolerance: Patient limited by pain;Patient limited by fatigue Patient left: in bed;with call bell/phone within reach;with bed alarm set  OT Visit Diagnosis: Unsteadiness on feet (R26.81);Muscle weakness (generalized) (M62.81)                Time: 3559-7416 OT Time Calculation  (min): 26 min Charges:  OT General Charges $OT Visit: 1 Visit OT Evaluation $OT Eval Low Complexity: 1 Low OT Treatments $Therapeutic Activity: 8-22 mins  Lesle Chris, OTR/L Acute Rehabilitation Services [phone] [facility] pager [phone] office 04/09/2019  Raquel Racey 04/09/2019, 4:34 PM

## 2019-04-09 NOTE — Progress Notes (Signed)
OT Cancellation Note  Patient Details Name: CHANC KERVIN MRN: [id number] DOB: 1961/05/15   Cancelled Treatment:    Reason Eval/Treat Not Completed: Other (comment).  RN is about to bring pt pain medication. Will try to check back on pt later today  Breanna Shorkey 04/09/2019, 2:18 PM  Lesle Chris, OTR/L Acute Rehabilitation Services [phone] [facility] pager [phone] office 04/09/2019

## 2019-04-09 NOTE — Progress Notes (Signed)
PROGRESS NOTE  BRAVE DACK AVW:[id number] DOB: Apr 02, 1961   PCP: Nonnie Done., MD  Patient is from: Home.Lives with his wife.  Progressive ambulatory dysfunction.   DOA: 04/04/2019 LOS: 5  Brief Narrative / Interim history: 58 year old male with a history of EtOH/hepatitis C cirrhosis s/p treatment for hepatitis C, ongoing alcohol abuse, frequent syncope/fall, DM-2, depression/anxiety, noncompliance and recurrent right pleural effusion presented 04/04/2019 with fatigue, fall (3 in a week), fever, chills, hematemesis, melanotic diarrhea and physical deconditioning.  He had relapse of alcohol use.  Reportedly drinks 1/5 of whiskey.  No alcohol or recreational drug use.  Patient was admitted for DKA, SIRS with multiple organ failure without clear source of infection, upper GI bleed, AKI, syncope, alcohol abuse/withdrawal.  PCCM and GI consulted. Empirically started on broad-spectrum antibiotics.  Also started on IV insulin and IV fluid hydration.  Had EGD on 11/13 that revealed LA grade D reflux esophagitis with no bleeding, large (> 5 mm) esophageal varices and portal hypertensive gastropathy.  Per GI, bleeding was presumed to be from gastritis versus varices.  He was treated with octreotide drip and PPI.   Subjective: No major events overnight of this morning.  Slept well overnight.  Endorses some dyspnea, wheeze and cough.  Continues to endorse left-sided thoracoabdominal pain.  He also reports bilateral lower extremity pain that he describes as bee stings.  Still with significant scrotal edema.  Denies melena or hematochezia.  Objective: Vitals:   04/08/19 1311 04/08/19 1534 04/08/19 2020 04/09/19 0548  BP: 134/86 140/82 139/86 (!) 135/104  Pulse: (!) 101 96 (!) 106 (!) 107  Resp: Temp: 98.6 F (37 C) (!) 97.5 F (36.4 C) 98.1 F (36.7 C) 97.6 F (36.4 C)  TempSrc: Oral Oral Oral Oral  SpO2: 99% 99% 97% 96%  Weight:      Height:        Intake/Output  Summary (Last 24 hours) at 04/09/2019 1405 Last data filed at 04/09/2019 0600 Gross per 24 hour  Intake 355 ml  Output -  Net 355 ml   Filed Weights   04/05/19 0500 04/05/19 1133 04/08/19 0003  Weight: 92.1 kg 92 kg 101.1 kg    Examination:  GENERAL: No acute distress.  Appears well.  HEENT: MMM.  Sclera anicteric. NECK: Supple.  No apparent JVD.  RESP:  No IWOB.  Fair air movement bilaterally.  Mild end expiratory wheeze although seems to be upper airway. CVS: Mild tachycardia.  Regular. Heart sounds normal.  ABD/GI/GU: Bowel sounds present. Soft.  Tenderness over left thoracoabdominal region laterally.  Significant scrotal edema.  No erythema or tenderness. MSK/EXT:  Moves extremities. No apparent deformity or edema.  SKIN: no apparent skin lesion or wound NEURO: Awake, alert and oriented appropriately.  No apparent focal neuro deficit. PSYCH: Calm. Normal affect.   Assessment & Plan: Acute on chronic blood loss anemia due to upper GI bleed: Hematemesis and melena -Hgb 15 (04/2018)> 7.0 (admit)> 5.6>2u> 8.1> 7.8> 8.5 -Anemia panel consistent with severe iron deficiency with iron saturation of 9% -EGD on 11/13-LA grade D reflux esophagitis with no bleeding, large (> 5 mm) EV and PHG.   -Per GI, bleeding was presumed to be from gastritis versus varices -GI following-off octreotide.  Continue PPI twice daily for 1 month, then daily. -Encourage alcohol cessation-not ready for rehab.  Will provide resources -Received IV Feraheme 510 mg 11/16.   -Continue p.o. iron with good bowel regimen  History of EtOH/hepatitis C cirrhosis-s/p treatment  for hepatitis C Coagulopathy/thrombocytopenia-likely due to alcohol and cirrhosis-improving Elevated liver enzymes/hyperbilirubinemia-likely due to alcohol +/-ischemia-improving Alcohol use disorder -Continue CIWA, lab monitoring -Multivitamins, thiamine and folic acid -We will provide resources for alcohol cessation -Continue Lasix and  Aldactone-fair urine output although not captured overnight  Fluid overload with a scrotal edema: Likely due to cirrhosis and hypoalbuminemia.  He echo in 2017 with EF of 65 to 70% but indeterminate diastolic dysfunction.  Reports some shortness of breath, wheeze and cough.  Weight up about 20 pounds from admission.  Still with significant scrotal edema.  Had about 700 cc urine output during the day but not catheter overnight.  Creatinine slightly up -Continue Lasix and Aldactone as above -Monitor fluid status.  If no improvement, will consider echocardiogram -Scrotal sling -Sodium and fluid restriction  Left thoracoabdominal pain/lower extremity neuropathic pain-no splenomegaly on abdominal ultrasound.  Likely musculoskeletal from fall -Lidoderm patch and as needed fentanyl -Added gabapentin for neuropathic pain-this could help with his alcohol  SIRS with multiple organ failure/injury: Infectious work-up including COVID-19, blood culture and urine culture not revealing.  No signs of SBP. -Empirically treated with broad-spectrum antibiotics (vancomycin, cefepime and Flagyl) 11/12-11/14  -Continue monitoring  Cough/dyspnea/wheezing: Never smoker.  No history of COPD or asthma.  CXR on 11/12 did not reveal venous congestion. -We will try mucolytic's and as needed DuoNeb -If no improvement, will get two-view chest x-ray  DKA/uncontrolled DM-2 with hyperglycemia: DKA resolved.  A1c 6.1% on 11/12.  CBG elevated -Increase Lantus to 13 units -Continue SSI-thin -Change diet to carb modified and heart healthy with fluid and salt restriction -Hold off statin due to hepatitis  Hypokalemia: Likely due to diuretics and IV fluid.  Resolved. -Continue monitoring  AKI: Likely due to dehydration.  Resolved. -Continue monitoring  Noncompliance with medications: reportedly lost his insurance and has been able to afford? -Counseled.  Will consult case management close to discharge for medication  assistance  Morbid obesity: BMI 35.97 -Encourage lifestyle change to lose weight  Recurrent fall at home/syncope/physical deconditioning/debility: due to alcohol and his comorbidiy as above -Fall precautions -Treat treatable causes as above -PT/OT eval and treatment                DVT prophylaxis: SCD in the setting of GI bleed and thrombocytopenia Code Status: Full code Family Communication: Patient and/or RN. Available if any question. Disposition Plan: Remains inpatient.  Final disposition likely home with [facility] when medically stable versus SNF as patient has no insurance. Consultants: PCCM (signed off), GI  Procedures:  11/13-EGD as above  Microbiology summarized: 11/12-COVID-19 screen negative 11/12-blood cultures negative 11/12-urine culture with insignificant gross  Sch Meds:  Scheduled Meds: . Chlorhexidine Gluconate Cloth  6 each Topical Daily  . dextromethorphan-guaiFENesin  1 tablet Oral BID  . ferrous sulfate  325 mg Oral BID WC  . folic acid  1 mg Oral Daily  . furosemide  40 mg Oral Daily  . gabapentin  300 mg Oral BID  . insulin aspart  0-9 Units Subcutaneous Q4H  . insulin glargine  10 Units Subcutaneous Q0600  . lidocaine  1 patch Transdermal Q24H  . mouth rinse  15 mL Mouth Rinse BID  . multivitamin with minerals  1 tablet Oral Daily  . neomycin-bacitracin-polymyxin   Topical BID  . pantoprazole  40 mg Oral BID  . polyethylene glycol  17 g Oral Daily  . spironolactone  100 mg Oral Daily  . thiamine  100 mg Oral Daily   Or  .  thiamine  100 mg Intravenous Daily  . traZODone  25 mg Oral QHS   Continuous Infusions: PRN Meds:.fentaNYL (SUBLIMAZE) injection, ipratropium-albuterol, labetalol, LORazepam **OR** LORazepam, ondansetron (ZOFRAN) IV, senna  Antimicrobials: Anti-infectives (From admission, onward)   Start     Dose/Rate Route Frequency Ordered Stop   04/05/19 1500  vancomycin (VANCOCIN) IVPB 1000 mg/200 mL premix  Status:  Discontinued      1,000 mg 200 mL/hr over 60 Minutes Intravenous Every 24 hours 04/04/19 1452 04/06/19 1223   04/05/19 0100  ceFEPIme (MAXIPIME) 2 g in sodium chloride 0.9 % 100 mL IVPB  Status:  Discontinued     2 g 200 mL/hr over 30 Minutes Intravenous Every 12 hours 04/04/19 1452 04/06/19 1223   04/04/19 2200  metroNIDAZOLE (FLAGYL) IVPB 500 mg  Status:  Discontinued     500 mg 100 mL/hr over 60 Minutes Intravenous Every 8 hours 04/04/19 1332 04/06/19 1223   04/04/19 1500  vancomycin (VANCOCIN) 2,000 mg in sodium chloride 0.9 % 500 mL IVPB     2,000 mg 250 mL/hr over 120 Minutes Intravenous NOW 04/04/19 1452 04/04/19 1707   04/04/19 1145  ceFEPIme (MAXIPIME) 2 g in sodium chloride 0.9 % 100 mL IVPB     2 g 200 mL/hr over 30 Minutes Intravenous  Once 04/04/19 1139 04/04/19 1354   04/04/19 1145  metroNIDAZOLE (FLAGYL) IVPB 500 mg     500 mg 100 mL/hr over 60 Minutes Intravenous  Once 04/04/19 1139 04/04/19 1630   04/04/19 1145  vancomycin (VANCOCIN) IVPB 1000 mg/200 mL premix  Status:  Discontinued     1,000 mg 200 mL/hr over 60 Minutes Intravenous  Once 04/04/19 1139 04/04/19 1452       I have personally reviewed the following labs and images: CBC: Recent Labs  Lab 04/04/19 0948  04/06/19 0700 04/06/19 0816 04/07/19 0547 04/08/19 0807 04/09/19 0615  WBC 35.9*   < > 8.3 9.3 6.4 5.8 6.0  NEUTROABS 27.0*  --   --   --   --   --   --   HGB 7.0*   < > 7.6* 7.9* 7.8* 8.4* 8.5*  HCT 22.1*   < > 23.0* 23.8* 23.9* 25.7* 26.9*  MCV 97.4   < > 90.2 89.8 90.2 91.5 92.4  PLT 233   < > 63* 66* 62* 66* 77*   < > = values in this interval not displayed.   BMP &GFR Recent Labs  Lab 04/04/19 1340  04/05/19 0520 04/06/19 0700 04/07/19 0547 04/08/19 0807 04/09/19 0615  NA 134*   < > 134* 135 136 135 137  K 3.7   < > 3.3* 3.3* 3.1* 3.2* 3.8  CL 101   < > 106 104 101 97* 100  CO2 14*   < > 23 27 28 30 30   GLUCOSE 243*   < > 227* 158* 160* 145* 167*  BUN 52*   < > 44* 31* 22* 19 18  CREATININE  1.79*   < > 1.43* 1.38* 1.24 1.10 1.20  CALCIUM 7.1*   < > 6.6* 7.3* 7.6* 7.6* 8.1*  MG 1.8  --   --  2.0  --  2.1 2.1  PHOS 2.4*  --   --  1.6* 2.4* 2.6 2.9   < > = values in this interval not displayed.   Estimated Creatinine Clearance: 76.5 mL/min (by C-G formula based on SCr of 1.2 mg/dL). Liver & Pancreas: Recent Labs  Lab 04/04/19 1340 04/06/19 0700 04/07/19 0547  04/08/19 0807 04/09/19 0615  AST 641* 940* 763* 470* 244*  ALT 435* 661* 667* 550* 395*  ALKPHOS 48 58 72 79 84  BILITOT 1.5* 2.2* 2.1* 1.7* 1.1  PROT 4.1* 4.6* 4.7* 4.9* 4.9*  ALBUMIN 2.6* 2.8* 2.8* 2.9* 3.0*   Recent Labs  Lab 04/04/19 0948 04/09/19 0615  LIPASE 162* 73*   No results for input(s): AMMONIA in the last 168 hours. Diabetic: No results for input(s): HGBA1C in the last 72 hours. Recent Labs  Lab 04/08/19 2018 04/09/19 0013 04/09/19 0417 04/09/19 0741 04/09/19 1217  GLUCAP 271* 190* 155* 201* 211*   Cardiac Enzymes: Recent Labs  Lab 04/09/19 0615  CKTOTAL 97   No results for input(s): PROBNP in the last 8760 hours. Coagulation Profile: Recent Labs  Lab 04/04/19 0948 04/05/19 1945 04/06/19 0700 04/09/19 0615  INR 2.3* 1.8* 1.7* 1.2   Thyroid Function Tests: No results for input(s): TSH, T4TOTAL, FREET4, T3FREE, THYROIDAB in the last 72 hours. Lipid Profile: No results for input(s): CHOL, HDL, LDLCALC, TRIG, CHOLHDL, LDLDIRECT in the last 72 hours. Anemia Panel: Recent Labs    04/08/19 0807  VITAMINB12 1,142*  FOLATE 17.9  FERRITIN 196  TIBC 274  IRON 24*  RETICCTPCT 7.4*   Urine analysis:    Component Value Date/Time   COLORURINE YELLOW 04/04/2019 0911   APPEARANCEUR CLEAR 04/04/2019 0911   LABSPEC 1.017 04/04/2019 0911   PHURINE 5.0 04/04/2019 0911   GLUCOSEU >=500 (A) 04/04/2019 0911   HGBUR NEGATIVE 04/04/2019 0911   BILIRUBINUR NEGATIVE 04/04/2019 0911   KETONESUR 5 (A) 04/04/2019 0911   PROTEINUR NEGATIVE 04/04/2019 0911   NITRITE NEGATIVE 04/04/2019  0911   LEUKOCYTESUR NEGATIVE 04/04/2019 0911   Sepsis Labs: Invalid input(s): PROCALCITONIN, [place]  Microbiology: Recent Results (from the past 240 hour(s))  SARS CORONAVIRUS 2 (TAT 6-24 HRS) Nasopharyngeal Urine, Clean Catch     Status: None   Collection Time: 04/04/19 11:28 AM   Specimen: Urine, Clean Catch; Nasopharyngeal  Result Value Ref Range Status   SARS Coronavirus 2 NEGATIVE NEGATIVE Final    Comment: (NOTE) SARS-CoV-2 target nucleic acids are NOT DETECTED. The SARS-CoV-2 RNA is generally detectable in upper and lower respiratory specimens during the acute phase of infection. Negative results do not preclude SARS-CoV-2 infection, do not rule out co-infections with other pathogens, and should not be used as the sole basis for treatment or other patient management decisions. Negative results must be combined with clinical observations, patient history, and epidemiological information. The expected result is Negative. Fact Sheet for Patients: SugarRoll.be Fact Sheet for Healthcare Providers: https://www.woods-mathews.com/ This test is not yet approved or cleared by the [place] FDA and  has been authorized for detection and/or diagnosis of SARS-CoV-2 by FDA under an Emergency Use Authorization (EUA). This EUA will remain  in effect (meaning this test can be used) for the duration of the COVID-19 declaration under Section 56 4(b)(1) of the Act, 21 U.S.C. section 360bbb-3(b)(1), unless the authorization is terminated or revoked sooner. Performed at [facility], [place] [address], [place], [place] [address]   Blood Culture (routine x 2)     Status: None   Collection Time: 04/04/19 12:21 PM   Specimen: BLOOD  Result Value Ref Range Status   Specimen Description   Final    BLOOD RIGHT WRIST Performed at [place] [address], [place], [place] [address]    Special Requests   Final     BOTTLES DRAWN AEROBIC ONLY Blood  Culture adequate volume Performed at [facility], [address] [place], [place] [address]    Culture   Final    NO GROWTH 5 DAYS Performed at [facility], [address] [place], [place] [address]    Report Status 04/09/2019 FINAL  Final  Urine culture     Status: Abnormal   Collection Time: 04/04/19 12:21 PM   Specimen: In/Out Cath Urine  Result Value Ref Range Status   Specimen Description   Final    IN/OUT CATH URINE Performed at [facility], [address] [place], [place] [address]    Special Requests   Final    NONE Performed at [facility], [address] [place], [place] [address]    Culture (A)  Final    <10,000 COLONIES/mL INSIGNIFICANT GROWTH Performed at [facility], [address] [place], [place] [address]    Report Status 04/06/2019 FINAL  Final  MRSA PCR Screening     Status: None   Collection Time: 04/04/19  1:36 PM   Specimen: Nasal Mucosa; Nasopharyngeal  Result Value Ref Range Status   MRSA by PCR NEGATIVE NEGATIVE Final    Comment:        The GeneXpert MRSA Assay (FDA approved for NASAL specimens only), is one component of a comprehensive MRSA colonization surveillance program. It is not intended to diagnose MRSA infection nor to guide or monitor treatment for MRSA infections. Performed at [facility], [address] [place], [place] [address]   Culture, blood (single)     Status: None   Collection Time: 04/04/19  2:30 PM   Specimen: BLOOD  Result Value Ref Range Status   Specimen Description   Final    BLOOD LEFT HAND Performed at [facility], [address] [place], [place] [address]    Special Requests   Final    BOTTLES DRAWN AEROBIC ONLY Blood Culture adequate volume Performed at [facility], [address] [place], [place] [address]    Culture   Final    NO  GROWTH 5 DAYS Performed at [facility], [address] [place], [place] [address]    Report Status 04/09/2019 FINAL  Final    Radiology Studies: No results found.   Brayen Bunn T. [employer] [facility]  If 7PM-7AM, please contact night-coverage [email or web address] Password [email or web address] 04/09/2019, 2:05 PM

## 2019-04-09 NOTE — Progress Notes (Signed)
Subjective: No complaints.  Objective: Vital signs in last 24 hours: Temp:  [97.6 F (36.4 C)-98.1 F (36.7 C)] 97.7 F (36.5 C) (11/17 1539) Pulse Rate:  [106-110] 110 (11/17 1539) Resp:  [16-17] 16 (11/17 1539) BP: (122-139)/(86-104) 122/88 (11/17 1539) SpO2:  [96 %-97 %] 96 % (11/17 1539) Last BM Date: 04/08/19  Intake/Output from previous day: 11/16 0701 - 11/17 0700 In: 683.3 [P.O.:595; I.V.:88.3] Out: 700 [Urine:700] Intake/Output this shift: No intake/output data recorded.  General appearance: alert and no distress GI: soft, non-tender; bowel sounds normal; no masses,  no organomegaly Extremities: anasarca  Lab Results: Recent Labs    04/07/19 0547 04/08/19 0807 04/09/19 0615  WBC 6.4 5.8 6.0  HGB 7.8* 8.4* 8.5*  HCT 23.9* 25.7* 26.9*  PLT 62* 66* 77*   BMET Recent Labs    04/07/19 0547 04/08/19 0807 04/09/19 0615  NA 136 135 137  K 3.1* 3.2* 3.8  CL 101 97* 100  CO2 28 30 30   GLUCOSE 160* 145* 167*  BUN 22* 19 18  CREATININE 1.24 1.10 1.20  CALCIUM 7.6* 7.6* 8.1*   LFT Recent Labs    04/09/19 0615  PROT 4.9*  ALBUMIN 3.0*  AST 244*  ALT 395*  ALKPHOS 84  BILITOT 1.1   PT/INR Recent Labs    04/09/19 0615  LABPROT 15.5*  INR 1.2   Hepatitis Panel No results for input(s): HEPBSAG, HCVAB, HEPAIGM, HEPBIGM in the last 72 hours. C-Diff No results for input(s): CDIFFTOX in the last 72 hours. Fecal Lactopherrin No results for input(s): FECLLACTOFRN in the last 72 hours.  Studies/Results: No results found.  Medications:  Scheduled: . Chlorhexidine Gluconate Cloth  6 each Topical Daily  . dextromethorphan-guaiFENesin  1 tablet Oral BID  . ferrous sulfate  325 mg Oral BID WC  . folic acid  1 mg Oral Daily  . furosemide  40 mg Oral Daily  . gabapentin  300 mg Oral BID  . insulin aspart  0-9 Units Subcutaneous Q4H  . [START ON 04/10/2019] insulin glargine  13 Units Subcutaneous Q0600  . lidocaine  1 patch Transdermal Q24H  . mouth  rinse  15 mL Mouth Rinse BID  . multivitamin with minerals  1 tablet Oral Daily  . neomycin-bacitracin-polymyxin   Topical BID  . pantoprazole  40 mg Oral BID  . polyethylene glycol  17 g Oral Daily  . spironolactone  100 mg Oral Daily  . thiamine  100 mg Oral Daily   Or  . thiamine  100 mg Intravenous Daily  . traZODone  25 mg Oral QHS   Continuous:   Assessment/Plan: 1) Fluid overload. 2) Cirrhosis. 3) Right pleural effusion. 4) Elevated liver enzymes - improving.   The patient needs to be on a 2 gram sodium diet.  Fluid restriction is NOT necessary and very uncomfortable for the patient.  The most effective way to diurese the patient is to keep him on a strict low sodium diet with diuretics.  He is tolerating Step I diuretics, but he has a significant amount of fluid overload.  Plan: 1) Increase to Step II diuretics. 2) 2 grams sodium diet. 3) Continue to follow daily weights.   LOS: 5 days   Abby Stines D 04/09/2019, 3:50 PM

## 2019-04-09 NOTE — Progress Notes (Signed)
Patient has received teaching on how to check his blood sugar via fingerstick and additionally, thge patient has learned to administer himself insulin and we will continue to monitor his education process.

## 2019-04-09 NOTE — Progress Notes (Signed)
Inpatient Diabetes Program Recommendations  AACE/ADA: New Consensus Statement on Inpatient Glycemic Control (2015)  Target Ranges:  Prepandial:   less than 140 mg/dL      Peak postprandial:   less than 180 mg/dL (1-2 hours)      Critically ill patients:  140 - 180 mg/dL   Lab Results  Component Value Date   GLUCAP 201 (H) 04/09/2019   HGBA1C 6.1 (H) 04/04/2019    Review of Glycemic Control  Blood sugars 155, 201, 211 today. Lantus increased to 13 units QD, starting 11/18 Continues with Novolog 0-9 units Q4H.  Inpatient Diabetes Program Recommendations:     Add CHO mod to 2 gm Na diet. If post-prandials continue > goal of 180 mg/dL, add Novolog 3 units tidwc for meal coverage insulin  Spoke with pt at length going home on insulin. Discussed HgbA1C not accurate d/t low H/H. Pt states he would rather use insulin pen than syringe. Discussed [employer] brand insulin pens ($44) and pt states he can afford this amount. Will need PCP to manage his diabetes. Does not have PCP since no insurance. Lives in [place] Work is assisting with this, per pt.   Talked with RN regarding allowing pt to give his own insulin injections and stick finger for monitoring. Pt does have meter and supplies at home.  Will order insulin pen starter kit. Demonstrated insulin pen use this morning. Will have pt demonstrate knowledge of insulin pen and use on 11/18.  Continue to follow closely.  Thank you. Lorenda Peck, RD, LDN, CDE Inpatient Diabetes Coordinator [phone]

## 2019-04-10 LAB — BASIC METABOLIC PANEL
Anion gap: 7 (ref 5–15)
BUN: 16 mg/dL (ref 6–20)
CO2: 29 mmol/L (ref 22–32)
Calcium: 7.8 mg/dL — ABNORMAL LOW (ref 8.9–10.3)
Chloride: 97 mmol/L — ABNORMAL LOW (ref 98–111)
Creatinine, Ser: 1.16 mg/dL (ref 0.61–1.24)
GFR calc Af Amer: >60 mL/min (ref >60)
GFR calc non Af Amer: >60 mL/min (ref >60)
Glucose, Bld: 203 mg/dL — ABNORMAL HIGH (ref 70–99)
Potassium: 3.9 mmol/L (ref 3.5–5.1)
Sodium: 133 mmol/L — ABNORMAL LOW (ref 135–145)

## 2019-04-10 LAB — CBC
HCT: 26.5 % — ABNORMAL LOW (ref 39.0–52.0)
Hemoglobin: 8.4 g/dL — ABNORMAL LOW (ref 13.0–17.0)
MCH: 29.6 pg (ref 26.0–34.0)
MCHC: 31.7 g/dL (ref 30.0–36.0)
MCV: 93.3 fL (ref 80.0–100.0)
Platelets: 92 10*3/uL — ABNORMAL LOW (ref 150–400)
RBC: 2.84 MIL/uL — ABNORMAL LOW (ref 4.22–5.81)
RDW: 16.1 % — ABNORMAL HIGH (ref 11.5–15.5)
WBC: 6.6 10*3/uL (ref 4.0–10.5)
nRBC: 0.8 % — ABNORMAL HIGH (ref 0.0–0.2)

## 2019-04-10 LAB — GLUCOSE, CAPILLARY
Glucose-Capillary: 194 mg/dL — ABNORMAL HIGH (ref 70–99)
Glucose-Capillary: 160 mg/dL — ABNORMAL HIGH (ref 70–99)
Glucose-Capillary: 189 mg/dL — ABNORMAL HIGH (ref 70–99)
Glucose-Capillary: 247 mg/dL — ABNORMAL HIGH (ref 70–99)
Glucose-Capillary: 233 mg/dL — ABNORMAL HIGH (ref 70–99)

## 2019-04-10 LAB — MAGNESIUM: Magnesium: 2 mg/dL (ref 1.7–2.4)

## 2019-04-10 LAB — LIPASE, BLOOD: Lipase: 72 U/L — ABNORMAL HIGH (ref 11–51)

## 2019-04-10 MED ORDER — VITAMIN B-6 50 MG PO TABS
50.0000 mg | ORAL_TABLET | Freq: Every day | ORAL | Status: DC
  Administered 2019-04-10 – 2019-04-12 (×3): 50 mg via ORAL
  Filled 2019-04-10 (×4): qty 1

## 2019-04-10 MED ORDER — GABAPENTIN 300 MG PO CAPS
300.0000 mg | ORAL_CAPSULE | Freq: Three times a day (TID) | ORAL | Status: DC
  Administered 2019-04-10 – 2019-04-12 (×6): 300 mg via ORAL
  Filled 2019-04-10 (×6): qty 1

## 2019-04-10 NOTE — Progress Notes (Signed)
Physical Therapy Treatment Patient Details Name: Connor Vaughn MRN: [id number] DOB: [birth year] Today's Date: 04/10/2019    History of Present Illness Pt is 58 yo male with hx of ETOH/hepatitis C cirrhosis, DM2, syncope, and ETOH abuse.  He was admitted with fatigue, falls, hematemesis and melena. Pt admitted with severe sepsis, upper GI bleed, and AKI. He had EGD on 11/13 which revealed esophagitis and esophagel varices and abdominal ascites.    PT Comments    Pt demonstrates increase gait today but continues to fatigue easily, have SHOB, and mild unsteadiness.  Will cont to benefit from PT services.  Did update POC to home 24 hr supv and HHPT -discussed with pt.    Follow Up Recommendations  Home health PT;Supervision/Assistance - 24 hour     Equipment Recommendations  Rolling walker with 5" wheels    Recommendations for Other Services       Precautions / Restrictions Precautions Precautions: Fall Restrictions Weight Bearing Restrictions: No    Mobility  Bed Mobility Overal bed mobility: Needs Assistance Bed Mobility: Supine to Sit;Sit to Supine     Supine to sit: Supervision;HOB elevated Sit to supine: Supervision;HOB elevated   General bed mobility comments: via sidelying  Transfers Overall transfer level: Needs assistance Equipment used: Rolling walker (2 wheeled) Transfers: Sit to/from Stand Sit to Stand: Min guard         General transfer comment: sit to stand x 3  Ambulation/Gait Ambulation/Gait assistance: Min guard Gait Distance (Feet): 80 Feet Assistive device: Rolling walker (2 wheeled) Gait Pattern/deviations: Wide base of support;Step-through pattern;Decreased stride length     General Gait Details: pt with SHOB;  on 2 LPM O2 with sats 95-97% during treatment; HR 115 bpm rest up to 130 bpm walking; increased time between transfers to recover   Stairs             Wheelchair Mobility    Modified Rankin (Stroke Patients Only)        Balance Overall balance assessment: Needs assistance Sitting-balance support: Bilateral upper extremity supported;Feet supported Sitting balance-Leahy Scale: Good     Standing balance support: Bilateral upper extremity supported;During functional activity Standing balance-Leahy Scale: Fair                              Cognition Arousal/Alertness: Awake/alert Behavior During Therapy: WFL for tasks assessed/performed Overall Cognitive Status: Within Functional Limits for tasks assessed                                        Exercises      General Comments        Pertinent Vitals/Pain Pain Assessment: No/denies pain     Home Living                      Prior Function            PT Goals (current goals can now be found in the care plan section) Progress towards PT goals: Progressing toward goals    Frequency    Min 3X/week      PT Plan Discharge plan needs to be updated    Co-evaluation              AM-PAC PT "6 Clicks" Mobility   Outcome Measure  Help needed turning from your back to your side while  in a flat bed without using bedrails?: A Little Help needed moving from lying on your back to sitting on the side of a flat bed without using bedrails?: None Help needed moving to and from a bed to a chair (including a wheelchair)?: None Help needed standing up from a chair using your arms (e.g., wheelchair or bedside chair)?: None Help needed to walk in hospital room?: None Help needed climbing 3-5 steps with a railing? : A Little 6 Click Score: 22    End of Session Equipment Utilized During Treatment: Gait belt Activity Tolerance: Patient limited by fatigue Patient left: with call bell/phone within reach;in bed;with bed alarm set Nurse Communication: Mobility status PT Visit Diagnosis: Unsteadiness on feet (R26.81);Muscle weakness (generalized) (M62.81);History of falling (Z91.81)     Time: 1287-8676 PT  Time Calculation (min) (ACUTE ONLY): 23 min  Charges:  $Gait Training: 8-22 mins $Therapeutic Activity: 8-22 mins                     Royetta Asal, PT Acute Rehab Services Pager [phone] [facility] [phone] [facility] [phone]    Connor Vaughn 04/10/2019, 12:59 PM

## 2019-04-10 NOTE — Progress Notes (Signed)
PROGRESS NOTE  Connor MontaneScott D Laur RUE:[id number]:[id number] DOB: [birth year]   PCP: [name], [name], MD  Patient is from: Home.Lives with his wife.  Progressive ambulatory dysfunction.   DOA: 04/04/2019 LOS: 6  Brief Narrative / Interim history: 58 year old male with a history of EtOH/hepatitis C cirrhosis s/p treatment for hepatitis C, ongoing alcohol abuse, frequent syncope/fall, DM-2, depression/anxiety, noncompliance and recurrent right pleural effusion presented 04/04/2019 with fatigue, fall (3 in a week), fever, chills, hematemesis, melanotic diarrhea and physical deconditioning.  He had relapse of alcohol use.  Reportedly drinks 1/5 of whiskey.  No alcohol or recreational drug use.  Patient was admitted for DKA, SIRS with multiple organ failure without clear source of infection, upper GI bleed, AKI, syncope, alcohol abuse/withdrawal.  PCCM and GI consulted. Empirically started on broad-spectrum antibiotics.  Also started on IV insulin and IV fluid hydration.  Had EGD on 11/13 that revealed LA grade D reflux esophagitis with no bleeding, large (> 5 mm) esophageal varices and portal hypertensive gastropathy.  Per GI, bleeding was presumed to be from gastritis versus varices.  He was treated with octreotide drip and PPI.  He is now on oral Protonix.  Patient with significant fluid overload.   Subjective: No major events overnight of this morning.  Reports having a very good night.  He says he slept last night.  However, he continues to endorse dry cough, bilateral lower extremity pain that he describes as bee stings and eft-sided abdominal wall pain especially with cough.  Still with significant scrotal swelling.  Reports making good amount of urine although this was not captured well.  Objective: Vitals:   04/09/19 2101 04/10/19 0500 04/10/19 0638 04/10/19 1314  BP:   (!) 142/84 113/87  Pulse:   (!) 105 (!) 108  Resp:   17 18  Temp:   97.8 F (36.6 C) 98 F (36.7 C)  TempSrc:   Oral Oral   SpO2: 93%  99% 99%  Weight:  104.5 kg    Height:        Intake/Output Summary (Last 24 hours) at 04/10/2019 1452 Last data filed at 04/10/2019 1322 Gross per 24 hour  Intake 540 ml  Output 2225 ml  Net -1685 ml   Filed Weights   04/05/19 1133 04/08/19 0003 04/10/19 0500  Weight: 92 kg 101.1 kg 104.5 kg    Examination:  GENERAL: No acute distress.  Appears well.  HEENT: MMM.  Sclera anicteric. NECK: Supple.  No apparent JVD.  RESP:  No IWOB.  Fair air movement bilaterally.  No wheeze or crackles. CVS: Slightly tachycardic. Heart sounds normal.  ABD/GI/GU: Bowel sounds present. Soft.  Tenderness over left mid abdomen.  No overlying skin erythema or palpable mass.  Significant scrotal edema.  No erythema or tenderness.  Scrotal support in place. MSK/EXT:  Moves extremities. No apparent deformity.  Trace edema bilaterally. SKIN: no apparent skin lesion or wound NEURO: Awake, alert and oriented appropriately.  No apparent focal neuro deficit. PSYCH: Calm. Normal affect.  Assessment & Plan: Acute on chronic blood loss anemia due to upper GI bleed: Hematemesis and melena -Hgb 15 (04/2018)> 7.0 (admit)> 5.6>2u> 8.1> 7.8> 8.5 -Anemia panel consistent with severe iron deficiency with iron saturation of 9% -EGD on 11/13-LA grade D reflux esophagitis with no bleeding, large (> 5 mm) EV and PHG.   -Per GI, bleeding was presumed to be from gastritis versus varices -GI following-off octreotide.  Continue PPI twice daily for 1 month, then daily. -Encourage alcohol cessation-not ready for  rehab.  Will provide resources -Received IV Feraheme 510 mg 11/16.   -Continue p.o. iron with good bowel regimen  History of EtOH/hepatitis C cirrhosis-s/p treatment for hepatitis C Coagulopathy/thrombocytopenia-likely due to alcohol and cirrhosis-improving Elevated liver enzymes/hyperbilirubinemia-likely due to alcohol +/-ischemia-improving Alcohol use disorder -Discontinue CIWA monitoring-should be  outside withdrawal window. -Multivitamins, thiamine and folic acid -We will provide resources for alcohol cessation -Continue Lasix and Aldactone-as below.  Fluid overload with a scrotal edema: Likely due to cirrhosis and hypoalbuminemia.  He echo in 2017 with EF of 65 to 70% but indeterminate diastolic dysfunction.  Continues to endorse dry cough.  Weight up about 20 pounds from admission.  Still with significant scrotal edema.  UOP 1.5 L plus some unmeasured voids. -GI increase Lasix to 80 mg and Aldactone to 200 mg daily -Monitor fluid status. -Scrotal sling -Sodium restriction.  Left abdominal wall pain/lower extremity neuropathic pain-no splenomegaly on abdominal ultrasound.  -Lidoderm patch and as needed fentanyl -Increase gabapentin to 300 mg 3 times daily-could help with alcohol use as well -Start pyridoxine 50 mg daily -Mucolytic's and antitussive for cough.  SIRS with multiple organ failure/injury: work-up including COVID-19, blood and urine culture negative.  No signs of SBP. -Empirically treated with broad-spectrum antibiotics (vancomycin, cefepime and Flagyl) 11/12-11/14  -Continue monitoring  Cough/dyspnea/wheezing: Never smoker.  No hx of COPD or asthma.  CXR on 11/12 did not reveal venous congestion. -We will try mucolytic's and as needed DuoNeb -If no improvement, will get two-view chest x-ray  DKA/uncontrolled DM-2 with hyperglycemia: DKA resolved.  A1c 6.1% on 11/12.  CBG elevated CBG (last 3)  Recent Labs    04/10/19 0433 04/10/19 0752 04/10/19 1206  GLUCAP 194* 160* 189*   -Continue Lantus 13 units -Continue SSI-thin -Change diet to carb modified -Hold off statin due to hepatitis  Hypokalemia: Likely due to diuretics and IV fluid.  Resolved. -Continue monitoring  AKI: Likely due to dehydration.  Resolved. -Continue monitoring  Noncompliance with medications: reportedly lost his insurance and has been able to afford? -Counseled.  Will consult case  management close to discharge for medication assistance  Morbid obesity: BMI 35.97 -Encourage lifestyle change to lose weight  Recurrent fall at home/syncope/physical deconditioning/debility: due to alcohol and his comorbidiy as above -Fall precautions -Treat treatable causes as above -PT/OT eval and treatment                DVT prophylaxis: SCD in the setting of GI bleed and thrombocytopenia Code Status: Full code Family Communication: Patient and/or RN. Available if any question. Disposition Plan: Remains inpatient due to significant fluid overload and scrotal edema Consultants: PCCM (signed off), GI  Procedures:  11/13-EGD as above  Microbiology summarized: 11/12-COVID-19 screen negative 11/12-blood cultures negative 11/12-urine culture with insignificant gross  Sch Meds:  Scheduled Meds: . dextromethorphan-guaiFENesin  1 tablet Oral BID  . ferrous sulfate  325 mg Oral BID WC  . folic acid  1 mg Oral Daily  . furosemide  80 mg Oral Daily  . gabapentin  300 mg Oral BID  . insulin aspart  0-9 Units Subcutaneous Q4H  . insulin glargine  13 Units Subcutaneous Q0600  . lidocaine  1 patch Transdermal Q24H  . mouth rinse  15 mL Mouth Rinse BID  . multivitamin with minerals  1 tablet Oral Daily  . neomycin-bacitracin-polymyxin   Topical BID  . pantoprazole  40 mg Oral BID  . polyethylene glycol  17 g Oral Daily  . spironolactone  200 mg Oral Daily  .  thiamine  100 mg Oral Daily   Or  . thiamine  100 mg Intravenous Daily  . traZODone  25 mg Oral QHS   Continuous Infusions: PRN Meds:.fentaNYL (SUBLIMAZE) injection, ipratropium-albuterol, labetalol, LORazepam **OR** LORazepam, ondansetron (ZOFRAN) IV, senna  Antimicrobials: Anti-infectives (From admission, onward)   Start     Dose/Rate Route Frequency Ordered Stop   04/05/19 1500  vancomycin (VANCOCIN) IVPB 1000 mg/200 mL premix  Status:  Discontinued     1,000 mg 200 mL/hr over 60 Minutes Intravenous Every 24  hours 04/04/19 1452 04/06/19 1223   04/05/19 0100  ceFEPIme (MAXIPIME) 2 g in sodium chloride 0.9 % 100 mL IVPB  Status:  Discontinued     2 g 200 mL/hr over 30 Minutes Intravenous Every 12 hours 04/04/19 1452 04/06/19 1223   04/04/19 2200  metroNIDAZOLE (FLAGYL) IVPB 500 mg  Status:  Discontinued     500 mg 100 mL/hr over 60 Minutes Intravenous Every 8 hours 04/04/19 1332 04/06/19 1223   04/04/19 1500  vancomycin (VANCOCIN) 2,000 mg in sodium chloride 0.9 % 500 mL IVPB     2,000 mg 250 mL/hr over 120 Minutes Intravenous NOW 04/04/19 1452 04/04/19 1707   04/04/19 1145  ceFEPIme (MAXIPIME) 2 g in sodium chloride 0.9 % 100 mL IVPB     2 g 200 mL/hr over 30 Minutes Intravenous  Once 04/04/19 1139 04/04/19 1354   04/04/19 1145  metroNIDAZOLE (FLAGYL) IVPB 500 mg     500 mg 100 mL/hr over 60 Minutes Intravenous  Once 04/04/19 1139 04/04/19 1630   04/04/19 1145  vancomycin (VANCOCIN) IVPB 1000 mg/200 mL premix  Status:  Discontinued     1,000 mg 200 mL/hr over 60 Minutes Intravenous  Once 04/04/19 1139 04/04/19 1452       I have personally reviewed the following labs and images: CBC: Recent Labs  Lab 04/04/19 0948  04/06/19 0816 04/07/19 0547 04/08/19 0807 04/09/19 0615 04/10/19 0525  WBC 35.9*   < > 9.3 6.4 5.8 6.0 6.6  NEUTROABS 27.0*  --   --   --   --   --   --   HGB 7.0*   < > 7.9* 7.8* 8.4* 8.5* 8.4*  HCT 22.1*   < > 23.8* 23.9* 25.7* 26.9* 26.5*  MCV 97.4   < > 89.8 90.2 91.5 92.4 93.3  PLT 233   < > 66* 62* 66* 77* 92*   < > = values in this interval not displayed.   BMP &GFR Recent Labs  Lab 04/04/19 1340  04/06/19 0700 04/07/19 0547 04/08/19 0807 04/09/19 0615 04/10/19 0525  NA 134*   < > 135 136 135 137 133*  K 3.7   < > 3.3* 3.1* 3.2* 3.8 3.9  CL 101   < > 104 101 97* 100 97*  CO2 14*   < > GLUCOSE 243*   < > 158* 160* 145* 167* 203*  BUN 52*   < > 31* 22* CREATININE 1.79*   < > 1.38* 1.24 1.10 1.20 1.16  CALCIUM 7.1*   < > 7.3*  7.6* 7.6* 8.1* 7.8*  MG 1.8  --  2.0  --  2.1 2.1 2.0  PHOS 2.4*  --  1.6* 2.4* 2.6 2.9  --    < > = values in this interval not displayed.   Estimated Creatinine Clearance: 80.6 mL/min (by C-G formula based on SCr of 1.16 mg/dL). Liver & Pancreas: Recent  Labs  Lab 04/04/19 1340 04/06/19 0700 04/07/19 0547 04/08/19 0807 04/09/19 0615  AST 641* 940* 763* 470* 244*  ALT 435* 661* 667* 550* 395*  ALKPHOS 48 58 72 79 84  BILITOT 1.5* 2.2* 2.1* 1.7* 1.1  PROT 4.1* 4.6* 4.7* 4.9* 4.9*  ALBUMIN 2.6* 2.8* 2.8* 2.9* 3.0*   Recent Labs  Lab 04/04/19 0948 04/09/19 0615 04/10/19 0525  LIPASE 162* 73* 72*   No results for input(s): AMMONIA in the last 168 hours. Diabetic: No results for input(s): HGBA1C in the last 72 hours. Recent Labs  Lab 04/09/19 2027 04/09/19 2344 04/10/19 0433 04/10/19 0752 04/10/19 1206  GLUCAP 246* 187* 194* 160* 189*   Cardiac Enzymes: Recent Labs  Lab 04/09/19 0615  CKTOTAL 97   No results for input(s): PROBNP in the last 8760 hours. Coagulation Profile: Recent Labs  Lab 04/04/19 0948 04/05/19 1945 04/06/19 0700 04/09/19 0615  INR 2.3* 1.8* 1.7* 1.2   Thyroid Function Tests: No results for input(s): TSH, T4TOTAL, FREET4, T3FREE, THYROIDAB in the last 72 hours. Lipid Profile: No results for input(s): CHOL, HDL, LDLCALC, TRIG, CHOLHDL, LDLDIRECT in the last 72 hours. Anemia Panel: Recent Labs    04/08/19 0807  VITAMINB12 1,142*  FOLATE 17.9  FERRITIN 196  TIBC 274  IRON 24*  RETICCTPCT 7.4*   Urine analysis:    Component Value Date/Time   COLORURINE YELLOW 04/04/2019 0911   APPEARANCEUR CLEAR 04/04/2019 0911   LABSPEC 1.017 04/04/2019 0911   PHURINE 5.0 04/04/2019 0911   GLUCOSEU >=500 (A) 04/04/2019 0911   HGBUR NEGATIVE 04/04/2019 0911   BILIRUBINUR NEGATIVE 04/04/2019 0911   KETONESUR 5 (A) 04/04/2019 0911   PROTEINUR NEGATIVE 04/04/2019 0911   NITRITE NEGATIVE 04/04/2019 0911   LEUKOCYTESUR NEGATIVE 04/04/2019 0911    Sepsis Labs: Invalid input(s): PROCALCITONIN, LACTICIDVEN  Microbiology: Recent Results (from the past 240 hour(s))  SARS CORONAVIRUS 2 (TAT 6-24 HRS) Nasopharyngeal Urine, Clean Catch     Status: None   Collection Time: 04/04/19 11:28 AM   Specimen: Urine, Clean Catch; Nasopharyngeal  Result Value Ref Range Status   SARS Coronavirus 2 NEGATIVE NEGATIVE Final    Comment: (NOTE) SARS-CoV-2 target nucleic acids are NOT DETECTED. The SARS-CoV-2 RNA is generally detectable in upper and lower respiratory specimens during the acute phase of infection. Negative results do not preclude SARS-CoV-2 infection, do not rule out co-infections with other pathogens, and should not be used as the sole basis for treatment or other patient management decisions. Negative results must be combined with clinical observations, patient history, and epidemiological information. The expected result is Negative. Fact Sheet for Patients: HairSlick.no Fact Sheet for Healthcare Providers: quierodirigir.com This test is not yet approved or cleared by the [place] FDA and  has been authorized for detection and/or diagnosis of SARS-CoV-2 by FDA under an Emergency Use Authorization (EUA). This EUA will remain  in effect (meaning this test can be used) for the duration of the COVID-19 declaration under Section 56 4(b)(1) of the Act, 21 U.S.C. section 360bbb-3(b)(1), unless the authorization is terminated or revoked sooner. Performed at [facility], [address] [place], [place] [address]   Blood Culture (routine x 2)     Status: None   Collection Time: 04/04/19 12:21 PM   Specimen: BLOOD  Result Value Ref Range Status   Specimen Description   Final    BLOOD RIGHT WRIST Performed at [facility], [address], [place], [place] [address]    Special Requests  Final    BOTTLES DRAWN AEROBIC ONLY Blood Culture adequate  volume Performed at [facility] [place] [address], [place], [place] [address]    Culture   Final    NO GROWTH 5 DAYS Performed at [facility], [place] [address], [place], [place] [address]    Report Status 04/09/2019 FINAL  Final  Urine culture     Status: Abnormal   Collection Time: 04/04/19 12:21 PM   Specimen: In/Out Cath Urine  Result Value Ref Range Status   Specimen Description   Final    IN/OUT CATH URINE Performed at [facility] [address], [place], [place] [address]    Special Requests   Final    NONE Performed at [facility], [place] [address], [place], [place] [address]    Culture (A)  Final    <10,000 COLONIES/mL INSIGNIFICANT GROWTH Performed at [facility] [address], [place], [place] [address]    Report Status 04/06/2019 FINAL  Final  MRSA PCR Screening     Status: None   Collection Time: 04/04/19  1:36 PM   Specimen: Nasal Mucosa; Nasopharyngeal  Result Value Ref Range Status   MRSA by PCR NEGATIVE NEGATIVE Final    Comment:        The GeneXpert MRSA Assay (FDA approved for NASAL specimens only), is one component of a comprehensive MRSA colonization surveillance program. It is not intended to diagnose MRSA infection nor to guide or monitor treatment for MRSA infections. Performed at [facility], [place] [address] [place], [place] [address]   Culture, blood (single)     Status: None   Collection Time: 04/04/19  2:30 PM   Specimen: BLOOD  Result Value Ref Range Status   Specimen Description   Final    BLOOD LEFT HAND Performed at [facility] [address], [place], [place] [address]    Special Requests   Final    BOTTLES DRAWN AEROBIC ONLY Blood Culture adequate volume Performed at [facility] [address], [place], [place] [address]    Culture   Final    NO GROWTH 5 DAYS Performed at [facility],  [place] [address], [place], [place] [address]    Report Status 04/09/2019 FINAL  Final    Radiology Studies: No results found.    T. Hamilton  If 7PM-7AM, please contact night-coverage [email or web address] Password [email or web address] 04/10/2019, 2:52 PM

## 2019-04-10 NOTE — Progress Notes (Signed)
Occupational Therapy Treatment Patient Details Name: Connor Vaughn MRN: [id number] DOB: 1960/07/16 Today's Date: 04/10/2019    History of present illness Pt is 58 yo male with hx of ETOH/hepatitis C cirrhosis, DM2, syncope, and ETOH abuse.  He was admitted with fatigue, falls, hematemesis and melena. Pt admitted with severe sepsis, upper GI bleed, and AKI. He had EGD on 11/13 which revealed esophagitis and esophagel varices and abdominal ascites.   OT comments  Fabricated scrotal sling, educated on AE and will issue 2 pieces he'd like later today.  Also assisted from bathroom with min guard assist  Follow Up Recommendations  Supervision/Assistance - 24 hour    Equipment Recommendations  (tba further possibly 3;1 commode)    Recommendations for Other Services      Precautions / Restrictions Precautions Precautions: Fall Restrictions Weight Bearing Restrictions: No       Mobility Bed Mobility           Sit to supine: Min guard   General bed mobility comments: via sidelying  Transfers   Equipment used: Rolling walker (2 wheeled)   Sit to Stand: Min guard         General transfer comment: use of rail in bathroom and on bed to stand.  Cues for hand placement on RW    Balance                                           ADL either performed or assessed with clinical judgement   ADL                           Toilet Transfer: Min guard             General ADL Comments: ambulated back from bathroom; adjusted height of RW up one notch; pt tends to walk behind it.  Fabricated scrotal sling and educated Therapist, sports and NT on application.  Instructions left in room and also explained to pt.  Reviewed AE and pt is interested in reacher and long sponge, which I will bring to him today.  He doesn't want wide sock aide     Vision       Perception     Praxis      Cognition Arousal/Alertness: Awake/alert Behavior During Therapy: WFL for  tasks assessed/performed Overall Cognitive Status: Within Functional Limits for tasks assessed                                          Exercises     Shoulder Instructions       General Comments      Pertinent Vitals/ Pain       Pain Assessment: Faces Faces Pain Scale: Hurts even more Pain Location: back, L side and groin Pain Descriptors / Indicators: Aching Pain Intervention(s): Limited activity within patient's tolerance;Monitored during session;Repositioned  Home Living                                          Prior Functioning/Environment              Frequency  Min 2X/week  Progress Toward Goals  OT Goals(current goals can now be found in the care plan section)  Progress towards OT goals: Progressing toward goals     Plan      Co-evaluation                 AM-PAC OT "6 Clicks" Daily Activity     Outcome Measure   Help from another person eating meals?: None Help from another person taking care of personal grooming?: A Little Help from another person toileting, which includes using toliet, bedpan, or urinal?: A Little Help from another person bathing (including washing, rinsing, drying)?: A Little Help from another person to put on and taking off regular upper body clothing?: A Little Help from another person to put on and taking off regular lower body clothing?: A Lot 6 Click Score: 18    End of Session    OT Visit Diagnosis: Unsteadiness on feet (R26.81);Muscle weakness (generalized) (M62.81)   Activity Tolerance Patient tolerated treatment well   Patient Left in bed;with call bell/phone within reach;with bed alarm set   Nurse Communication (scrotal sling)        Time: 0350-0938 OT Time Calculation (min): 33 min  Charges: OT General Charges $OT Visit: 1 Visit OT Treatments $Self Care/Home Management : 23-37 mins  Marica Otter, OTR/L Acute Rehabilitation Services [phone] [facility]  pager [phone] office 04/10/2019   Annaleigh Steinmeyer 04/10/2019, 10:17 AM

## 2019-04-11 ENCOUNTER — Inpatient Hospital Stay (HOSPITAL_COMMUNITY): Payer: Self-pay

## 2019-04-11 LAB — GLUCOSE, CAPILLARY
Glucose-Capillary: 168 mg/dL — ABNORMAL HIGH (ref 70–99)
Glucose-Capillary: 168 mg/dL — ABNORMAL HIGH (ref 70–99)
Glucose-Capillary: 173 mg/dL — ABNORMAL HIGH (ref 70–99)
Glucose-Capillary: 187 mg/dL — ABNORMAL HIGH (ref 70–99)
Glucose-Capillary: 192 mg/dL — ABNORMAL HIGH (ref 70–99)
Glucose-Capillary: 200 mg/dL — ABNORMAL HIGH (ref 70–99)

## 2019-04-11 LAB — COMPREHENSIVE METABOLIC PANEL
ALT: 211 U/L — ABNORMAL HIGH (ref 0–44)
AST: 79 U/L — ABNORMAL HIGH (ref 15–41)
Albumin: 3 g/dL — ABNORMAL LOW (ref 3.5–5.0)
Alkaline Phosphatase: 77 U/L (ref 38–126)
Anion gap: 8 (ref 5–15)
BUN: 15 mg/dL (ref 6–20)
CO2: 29 mmol/L (ref 22–32)
Calcium: 8 mg/dL — ABNORMAL LOW (ref 8.9–10.3)
Chloride: 94 mmol/L — ABNORMAL LOW (ref 98–111)
Creatinine, Ser: 1.16 mg/dL (ref 0.61–1.24)
GFR calc Af Amer: >60 mL/min (ref >60)
GFR calc non Af Amer: >60 mL/min (ref >60)
Glucose, Bld: 152 mg/dL — ABNORMAL HIGH (ref 70–99)
Potassium: 3.4 mmol/L — ABNORMAL LOW (ref 3.5–5.1)
Sodium: 131 mmol/L — ABNORMAL LOW (ref 135–145)
Total Bilirubin: 1.2 mg/dL (ref 0.3–1.2)
Total Protein: 5.1 g/dL — ABNORMAL LOW (ref 6.5–8.1)

## 2019-04-11 LAB — MAGNESIUM: Magnesium: 2.3 mg/dL (ref 1.7–2.4)

## 2019-04-11 LAB — HEMOGLOBIN AND HEMATOCRIT, BLOOD
HCT: 27.8 % — ABNORMAL LOW (ref 39.0–52.0)
Hemoglobin: 8.6 g/dL — ABNORMAL LOW (ref 13.0–17.0)

## 2019-04-11 LAB — PHOSPHORUS: Phosphorus: 3.5 mg/dL (ref 2.5–4.6)

## 2019-04-11 MED ORDER — TORSEMIDE 20 MG PO TABS
40.0000 mg | ORAL_TABLET | Freq: Every day | ORAL | Status: DC
  Administered 2019-04-12: 40 mg via ORAL
  Filled 2019-04-11 (×2): qty 2

## 2019-04-11 MED ORDER — POTASSIUM CHLORIDE CRYS ER 20 MEQ PO TBCR
40.0000 meq | EXTENDED_RELEASE_TABLET | Freq: Once | ORAL | Status: AC
  Administered 2019-04-11: 40 meq via ORAL
  Filled 2019-04-11: qty 2

## 2019-04-11 NOTE — Progress Notes (Signed)
Occupational Therapy Treatment Patient Details Name: Connor Vaughn MRN: [id number] DOB: 04-Mar-1961 Today's Date: 04/11/2019    History of present illness Pt is 58 yo male with hx of ETOH/hepatitis C cirrhosis, DM2, syncope, and ETOH abuse.  He was admitted with fatigue, falls, hematemesis and melena. Pt admitted with severe sepsis, upper GI bleed, and AKI. He had EGD on 11/13 which revealed esophagitis and esophagel varices and abdominal ascites.   OT comments  Practiced with reacher for dressing, stood to get weight, ambulated to bathroom and used toilet.  Pt would benefit from 3:1 over toilet to make transfers easier--he will think about this.  He did have a little difficulty with hygiene today--will offer him a toilet aide.  Pt did not have scrotal sling on and declines have this reapplied.  Follow Up Recommendations  Supervision/Assistance - 24 hour    Equipment Recommendations  3 in 1 bedside commode    Recommendations for Other Services      Precautions / Restrictions Precautions Precautions: Fall       Mobility Bed Mobility         Supine to sit: Supervision;HOB elevated Sit to supine: Supervision;HOB elevated      Transfers   Equipment used: Rolling walker (2 wheeled)   Sit to Stand: Min guard              Balance                                           ADL either performed or assessed with clinical judgement   ADL                           Toilet Transfer: Min guard   Toileting- Clothing Manipulation and Hygiene: Minimal assistance         General ADL Comments: checked in with pt about scrotal sling.  mesh underwear portion was removed due to being wet. He did not want me to change out and reapply:  he felt like it didn't make that much difference. Also offered to fabricate this just from stockinette, but he declined. Pt stood to get standing weight on scale, transferred to chair and used reacher to simulate  pants and then walked to use toilet. Returned to bed at end of session     Vision       Perception     Praxis      Cognition Arousal/Alertness: Awake/alert Behavior During Therapy: WFL for tasks assessed/performed Overall Cognitive Status: Within Functional Limits for tasks assessed                                          Exercises     Shoulder Instructions       General Comments      Pertinent Vitals/ Pain       Pain Assessment: Faces Faces Pain Scale: Hurts little more Pain Location: back, L side and groin Pain Descriptors / Indicators: Discomfort Pain Intervention(s): Limited activity within patient's tolerance;Monitored during session;Repositioned  Home Living  Prior Functioning/Environment              Frequency  Min 2X/week        Progress Toward Goals  OT Goals(current goals can now be found in the care plan section)  Progress towards OT goals: Progressing toward goals     Plan      Co-evaluation                 AM-PAC OT "6 Clicks" Daily Activity     Outcome Measure   Help from another person eating meals?: None Help from another person taking care of personal grooming?: A Little Help from another person toileting, which includes using toliet, bedpan, or urinal?: A Little Help from another person bathing (including washing, rinsing, drying)?: A Little Help from another person to put on and taking off regular upper body clothing?: A Little Help from another person to put on and taking off regular lower body clothing?: A Lot 6 Click Score: 18    End of Session    OT Visit Diagnosis: Unsteadiness on feet (R26.81);Muscle weakness (generalized) (M62.81)   Activity Tolerance Patient tolerated treatment well   Patient Left in bed;with call bell/phone within reach;with bed alarm set   Nurse Communication          Time: 2952-8413 OT Time Calculation  (min): 45 min  Charges: OT General Charges $OT Visit: 1 Visit OT Treatments $Self Care/Home Management : 23-37 mins $Therapeutic Activity: 8-22 mins  Lesle Chris, OTR/L Acute Rehabilitation Services [phone] [place] pager [phone] office 04/11/2019   [place] 04/11/2019, 12:02 PM

## 2019-04-11 NOTE — Progress Notes (Signed)
Subjective: Feeling well.  No complaints.  Objective: Vital signs in last 24 hours: Temp:  [98 F (36.7 C)] 98 F (36.7 C) (11/19 0626) Pulse Rate:  [100-108] 100 (11/19 0626) Resp:  [15-18] 16 (11/19 0626) BP: (113-152)/(78-87) 152/78 (11/19 0626) SpO2:  [95 %-99 %] 96 % (11/19 0626) Last BM Date: 04/10/19  Intake/Output from previous day: 11/18 0701 - 11/19 0700 In: 540 [P.O.:540] Out: 2151 [Urine:2150; Stool:1] Intake/Output this shift: No intake/output data recorded.  General appearance: alert and no distress GI: soft, non-tender; bowel sounds normal; no masses,  no organomegaly  Lab Results: Recent Labs    04/08/19 0807 04/09/19 0615 04/10/19 0525 04/11/19 0557  WBC 5.8 6.0 6.6  --   HGB 8.4* 8.5* 8.4* 8.6*  HCT 25.7* 26.9* 26.5* 27.8*  PLT 66* 77* 92*  --    BMET Recent Labs    04/09/19 0615 04/10/19 0525 04/11/19 0557  NA 137 133* 131*  K 3.8 3.9 3.4*  CL 100 97* 94*  CO2 30 29 29   GLUCOSE 167* 203* 152*  BUN 18 16 15   CREATININE 1.20 1.16 1.16  CALCIUM 8.1* 7.8* 8.0*   LFT Recent Labs    04/11/19 0557  PROT 5.1*  ALBUMIN 3.0*  AST 79*  ALT 211*  ALKPHOS 77  BILITOT 1.2   PT/INR Recent Labs    04/09/19 0615  LABPROT 15.5*  INR 1.2   Hepatitis Panel No results for input(s): HEPBSAG, HCVAB, HEPAIGM, HEPBIGM in the last 72 hours. C-Diff No results for input(s): CDIFFTOX in the last 72 hours. Fecal Lactopherrin No results for input(s): FECLLACTOFRN in the last 72 hours.  Studies/Results: No results found.  Medications:  Scheduled: . dextromethorphan-guaiFENesin  1 tablet Oral BID  . ferrous sulfate  325 mg Oral BID WC  . folic acid  1 mg Oral Daily  . furosemide  80 mg Oral Daily  . gabapentin  300 mg Oral TID  . insulin aspart  0-9 Units Subcutaneous Q4H  . insulin glargine  13 Units Subcutaneous Q0600  . lidocaine  1 patch Transdermal Q24H  . mouth rinse  15 mL Mouth Rinse BID  . multivitamin with minerals  1 tablet Oral  Daily  . neomycin-bacitracin-polymyxin   Topical BID  . pantoprazole  40 mg Oral BID  . polyethylene glycol  17 g Oral Daily  . vitamin B-6  50 mg Oral Daily  . spironolactone  200 mg Oral Daily  . thiamine  100 mg Oral Daily   Or  . thiamine  100 mg Intravenous Daily  . traZODone  25 mg Oral QHS   Continuous:   Assessment/Plan: 1) ETOH cirrhosis. 2) ETOH hepatitis. 3) Fluid overload.   The patient's weight this AM was at 108 kg, but this does not appear to be correct.  This is a 4 kg increase from yesterday.  The patient is on a low salt diet and he is diuresing well.  Plan: 1) Stand up weight. 2) Continue with Step II diuretics. 3) Continue with 2 gram sodium diet.  LOS: 7 days   Connor Vaughn D 04/11/2019, 7:14 AM

## 2019-04-11 NOTE — Progress Notes (Signed)
PROGRESS NOTE  Connor MontaneScott D Hanning ZOX:[id number] DOB: 1960/06/08   PCP: [name], [name], MD  Patient is from: Home.Lives with his wife.  Progressive ambulatory dysfunction.   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  He had relapse of alcohol use.  Reportedly drinks 1/5 of whiskey.  No alcohol or recreational drug use.  Patient was admitted for DKA, SIRS with multiple organ failure without clear source of infection, upper GI bleed, AKI, syncope, alcohol abuse/withdrawal.  PCCM and GI consulted. Empirically started on broad-spectrum antibiotics.  Also started on IV insulin and IV fluid hydration.  Had EGD on 11/13 that revealed LA grade D reflux esophagitis with no bleeding, large (> 5 mm) esophageal varices and portal hypertensive gastropathy.  Per GI, bleeding was presumed to be from gastritis versus varices.  He was treated with octreotide drip and PPI.  He is now on oral Protonix.  Patient with significant fluid overload.   Subjective: No major events overnight of this morning.  Feels his belly is distended.  Continues to endorse LUQ pain that has improved with Lidoderm patch.  Continues to endorse bilateral lower extremity pain that he describes as a bee sting.  Cough improved.  Denies dyspnea.  Objective: Vitals:   04/10/19 2248 04/11/19 0614 04/11/19 0626 04/11/19 1219  BP:   (!) 152/78 (!) 152/84  Pulse:   100 (!) 102  Resp:   16 20  Temp:   98 F (36.7 C) 98.4 F (36.9 C)  TempSrc:   Oral Oral  SpO2: 96% 95% 96% 94%  Weight:      Height:        Intake/Output Summary (Last 24 hours) at 04/11/2019 1230 Last data  filed at 04/11/2019 0700 Gross per 24 hour  Intake 480 ml  Output 2151 ml  Net -1671 ml   Filed Weights   04/05/19 1133 04/08/19 0003 04/10/19 0500  Weight: 92 kg 101.1 kg 104.5 kg    Examination:  GENERAL: No acute distress.  Appears well.  HEENT: MMM.  Vision and hearing grossly intact.  NECK: Supple.  No apparent JVD.  RESP:  No IWOB.  Fair air movement bilaterally.  No wheeze or crackles. CVS: Slightly tachycardic. Heart sounds normal.  ABD/GI/GU: Bowel sounds present.  Abdomen somewhat distended but not tender to palpation.  Significant scrotal swelling. MSK/EXT:  Moves extremities. No apparent deformity or edema.  SKIN: no apparent skin lesion or wound NEURO: Awake, alert and oriented appropriately.  No apparent focal neuro deficit. PSYCH: Calm. Normal affect.  Assessment & Plan: Acute on chronic blood loss anemia due to upper GI bleed: hematemesis and melena Thrombocytopenia-likely due to liver disease. Too quick for HIT -Hgb 15 (04/2018)> 7.0 (admit)> 5.6>2u> 8.1> 7.8> 8.6 -Platelet 233 (admit)> 62>> 92 -Anemia panel consistent with severe iron deficiency with iron saturation of 9% -EGD on 11/13-LA grade D reflux esophagitis with no bleeding, large (> 5 mm) EV and PHG.   -Per GI, bleeding was presumed to be from gastritis versus varices -GI following-off octreotide.  Continue PPI twice daily for 1 month, then daily. -Encourage alcohol cessation-not ready for rehab.  Will provide resources -Received IV Feraheme 510 mg 11/16.   -Continue p.o. iron with good bowel regimen  History of EtOH/hepatitis C cirrhosis-s/p treatment for hepatitis C Coagulopathy/thrombocytopenia-likely due to alcohol and cirrhosis-improving Elevated liver enzymes/hyperbilirubinemia-likely due to alcohol +/-ischemia-improving Alcohol use disorder -Discontinue CIWA monitoring-should be outside withdrawal window. -Multivitamins, thiamine and folic acid -We will provide resources for alcohol  cessation -Continue diuretics-we will change Lasix to torsemide  Fluid overload with a scrotal edema: Likely due to cirrhosis and hypoalbuminemia.  He echo in 2017 with EF of 65 to 70% but indeterminate diastolic dysfunction.  No dyspnea but intermittent dry cough.  No orthopnea or PND.  Still with significant scrotal edema.  2.2 L UOP/ 24 hours with some unmeasured voids.  Weight does not seem accurate. -Change Lasix 80 mg to torsemide 40 mg-daily bioavailability -Continue Aldactone 200 mg -Monitor fluid status with strict intake and output and a standing weight -Scrotal sling/support -Sodium restriction. -Limited abdominal ultrasound to exclude ascites.  Left abdominal wall pain/lower extremity neuropathic pain-no splenomegaly on abdominal ultrasound.  Improved. -Lidoderm patch and as needed fentanyl -Gabapentin 300 mg a.m. and p.m. and 600 mg at bedtime -Continue pyridoxine 50 mg daily -Mucolytic's and antitussive for cough.  SIRS with multiple organ failure/injury: work-up including COVID-19, blood and urine culture negative.  No signs of SBP.  Improving. -Empirically treated with broad-spectrum antibiotics (vancomycin, cefepime and Flagyl) 11/12-11/14  -Continue monitoring  Cough/dyspnea/wheezing: Never smoker.  No hx of COPD or asthma.  CXR on 11/12 negative for CHF. -Mucolytic's and as needed DuoNeb  -Discontinue oxygen  DKA/uncontrolled DM-2 with hyperglycemia: DKA resolved.  A1c 6.1% on 11/12.  CBG elevated CBG (last 3)  Recent Labs    04/11/19 0345 04/11/19 0752 04/11/19 1152  GLUCAP 168* 173* 192*   -Continue Lantus 13 units -Continue SSI-thin -Change diet to carb modified -Hold off statin due to hepatitis  Hypokalemia: Likely due to diuretics and IV fluid.  -Replenish and recheck.  He is also on high-dose Aldactone.  AKI: Likely due to dehydration.  Resolved. -Continue monitoring while on high-dose diuretics  Noncompliance with medications: reportedly lost his  insurance and has been able to afford? -Counseled.  Will consult case management close to discharge for medication assistance  Morbid obesity: BMI 35.97 -Encourage lifestyle change to lose weight  Recurrent fall at home/syncope/physical deconditioning/debility: due to alcohol and his comorbidiy as above -Fall precautions -Treat treatable causes as above -PT/OT eval and treatment                DVT prophylaxis: SCD in the setting of GI bleed and thrombocytopenia Code Status: Full code Family Communication: Patient and/or RN. Available if any question. Disposition Plan: Remains inpatient due to significant fluid overload and scrotal edema Consultants: PCCM (signed off), GI  Procedures:  11/13-EGD as above  Microbiology summarized: 11/12-COVID-19 screen negative 11/12-blood cultures negative 11/12-urine culture with insignificant gross  Sch Meds:  Scheduled Meds: . dextromethorphan-guaiFENesin  1 tablet Oral BID  . ferrous sulfate  325 mg Oral BID WC  . folic acid  1 mg Oral Daily  . gabapentin  300 mg Oral TID  . insulin aspart  0-9 Units Subcutaneous Q4H  . insulin glargine  13 Units Subcutaneous Q0600  . lidocaine  1 patch Transdermal Q24H  . mouth rinse  15 mL Mouth Rinse BID  . multivitamin with minerals  1 tablet Oral Daily  . neomycin-bacitracin-polymyxin   Topical BID  . pantoprazole  40 mg Oral BID  . polyethylene glycol  17 g Oral Daily  . vitamin B-6  50 mg Oral Daily  . spironolactone  200 mg Oral Daily  . thiamine  100 mg Oral Daily   Or  . thiamine  100 mg Intravenous Daily  . [START ON 04/12/2019] torsemide  40 mg Oral Daily  . traZODone  25 mg Oral QHS   Continuous Infusions: PRN Meds:.fentaNYL (SUBLIMAZE) injection, ipratropium-albuterol, labetalol, ondansetron (ZOFRAN) IV, senna  Antimicrobials: Anti-infectives (From admission, onward)   Start     Dose/Rate Route Frequency Ordered Stop   04/05/19 1500  vancomycin (VANCOCIN) IVPB 1000 mg/200  mL premix  Status:  Discontinued     1,000 mg 200 mL/hr over 60 Minutes Intravenous Every 24 hours 04/04/19 1452 04/06/19 1223   04/05/19 0100  ceFEPIme (MAXIPIME) 2 g in sodium chloride 0.9 % 100 mL IVPB  Status:  Discontinued     2 g 200 mL/hr over 30 Minutes Intravenous Every 12 hours 04/04/19 1452 04/06/19 1223   04/04/19 2200  metroNIDAZOLE (FLAGYL) IVPB 500 mg  Status:  Discontinued     500 mg 100 mL/hr over 60 Minutes Intravenous Every 8 hours 04/04/19 1332 04/06/19 1223   04/04/19 1500  vancomycin (VANCOCIN) 2,000 mg in sodium chloride 0.9 % 500 mL IVPB     2,000 mg 250 mL/hr over 120 Minutes Intravenous NOW 04/04/19 1452 04/04/19 1707   04/04/19 1145  ceFEPIme (MAXIPIME) 2 g in sodium chloride 0.9 % 100 mL IVPB     2 g 200 mL/hr over 30 Minutes Intravenous  Once 04/04/19 1139 04/04/19 1354   04/04/19 1145  metroNIDAZOLE (FLAGYL) IVPB 500 mg     500 mg 100 mL/hr over 60 Minutes Intravenous  Once 04/04/19 1139 04/04/19 1630   04/04/19 1145  vancomycin (VANCOCIN) IVPB 1000 mg/200 mL premix  Status:  Discontinued     1,000 mg 200 mL/hr over 60 Minutes Intravenous  Once 04/04/19 1139 04/04/19 1452       I have personally reviewed the following labs and images: CBC: Recent Labs  Lab 04/06/19 0816 04/07/19 0547 04/08/19 0807 04/09/19 0615 04/10/19 0525 04/11/19 0557  WBC 9.3 6.4 5.8 6.0 6.6  --   HGB 7.9* 7.8* 8.4* 8.5* 8.4* 8.6*  HCT 23.8* 23.9* 25.7* 26.9* 26.5* 27.8*  MCV 89.8 90.2 91.5 92.4 93.3  --   PLT 66* 62* 66* 77* 92*  --    BMP &GFR Recent Labs  Lab 04/06/19 0700 04/07/19 0547 04/08/19 0807 04/09/19 0615 04/10/19 0525 04/11/19 0557  NA 135 136 135 137 133* 131*  K 3.3* 3.1* 3.2* 3.8 3.9 3.4*  CL 104 101 97* 100 97* 94*  CO2 27 28 30 30 29 29   GLUCOSE 158* 160* 145* 167* 203* 152*  BUN 31* 22* 19 18 16 15   CREATININE 1.38* 1.24 1.10 1.20 1.16 1.16  CALCIUM 7.3* 7.6* 7.6* 8.1* 7.8* 8.0*  MG 2.0  --  2.1 2.1 2.0 2.3  PHOS 1.6* 2.4* 2.6 2.9  --   3.5   Estimated Creatinine Clearance: 80.6 mL/min (by C-G formula based on SCr of 1.16 mg/dL). Liver & Pancreas: Recent Labs  Lab 04/06/19 0700 04/07/19 0547 04/08/19 0807 04/09/19 0615 04/11/19 0557  AST 940* 763* 470* 244* 79*  ALT 661* 667* 550* 395* 211*  ALKPHOS 58 72 79 84 77  BILITOT 2.2* 2.1* 1.7* 1.1 1.2  PROT 4.6* 4.7* 4.9* 4.9* 5.1*  ALBUMIN 2.8* 2.8* 2.9* 3.0* 3.0*   Recent Labs  Lab 04/09/19 0615 04/10/19 0525  LIPASE 73* 72*   No results for input(s): AMMONIA in the last 168 hours. Diabetic: No  results for input(s): HGBA1C in the last 72 hours. Recent Labs  Lab 04/10/19 2149 04/11/19 0020 04/11/19 0345 04/11/19 0752 04/11/19 1152  GLUCAP 233* 187* 168* 173* 192*   Cardiac Enzymes: Recent Labs  Lab 04/09/19 0615  CKTOTAL 97   No results for input(s): PROBNP in the last 8760 hours. Coagulation Profile: Recent Labs  Lab 04/05/19 1945 04/06/19 0700 04/09/19 0615  INR 1.8* 1.7* 1.2   Thyroid Function Tests: No results for input(s): TSH, T4TOTAL, FREET4, T3FREE, THYROIDAB in the last 72 hours. Lipid Profile: No results for input(s): CHOL, HDL, LDLCALC, TRIG, CHOLHDL, LDLDIRECT in the last 72 hours. Anemia Panel: No results for input(s): VITAMINB12, FOLATE, FERRITIN, TIBC, IRON, RETICCTPCT in the last 72 hours. Urine analysis:    Component Value Date/Time   COLORURINE YELLOW 04/04/2019 0911   APPEARANCEUR CLEAR 04/04/2019 0911   LABSPEC 1.017 04/04/2019 0911   PHURINE 5.0 04/04/2019 0911   GLUCOSEU >=500 (A) 04/04/2019 0911   HGBUR NEGATIVE 04/04/2019 0911   BILIRUBINUR NEGATIVE 04/04/2019 0911   KETONESUR 5 (A) 04/04/2019 0911   PROTEINUR NEGATIVE 04/04/2019 0911   NITRITE NEGATIVE 04/04/2019 0911   LEUKOCYTESUR NEGATIVE 04/04/2019 0911   Sepsis Labs: Invalid input(s): PROCALCITONIN, [place]  Microbiology: Recent Results (from the past 240 hour(s))  SARS CORONAVIRUS 2 (TAT 6-24 HRS) Nasopharyngeal Urine, Clean Catch     Status:  None   Collection Time: 04/04/19 11:28 AM   Specimen: Urine, Clean Catch; Nasopharyngeal  Result Value Ref Range Status   SARS Coronavirus 2 NEGATIVE NEGATIVE Final    Comment: (NOTE) SARS-CoV-2 target nucleic acids are NOT DETECTED. The SARS-CoV-2 RNA is generally detectable in upper and lower respiratory specimens during the acute phase of infection. Negative results do not preclude SARS-CoV-2 infection, do not rule out co-infections with other pathogens, and should not be used as the sole basis for treatment or other patient management decisions. Negative results must be combined with clinical observations, patient history, and epidemiological information. The expected result is Negative. Fact Sheet for Patients: SugarRoll.be Fact Sheet for Healthcare Providers: https://www.woods-mathews.com/ This test is not yet approved or cleared by the [place] FDA and  has been authorized for detection and/or diagnosis of SARS-CoV-2 by FDA under an Emergency Use Authorization (EUA). This EUA will remain  in effect (meaning this test can be used) for the duration of the COVID-19 declaration under Section 56 4(b)(1) of the Act, 21 U.S.C. section 360bbb-3(b)(1), unless the authorization is terminated or revoked sooner. Performed at [facility], [place] [address], [place], [place] [address]   Blood Culture (routine x 2)     Status: None   Collection Time: 04/04/19 12:21 PM   Specimen: BLOOD  Result Value Ref Range Status   Specimen Description   Final    BLOOD RIGHT WRIST Performed at [facility] [address], [place], [place] [address]    Special Requests   Final    BOTTLES DRAWN AEROBIC ONLY Blood Culture adequate volume Performed at [facility] [address], [place], [place] [address]    Culture   Final    NO GROWTH 5 DAYS Performed at [facility], [place] [address], [place],  [place] [address]    Report Status 04/09/2019 FINAL  Final  Urine culture     Status: Abnormal   Collection Time: 04/04/19 12:21 PM   Specimen: In/Out Cath Urine  Result Value Ref Range Status   Specimen Description   Final  IN/OUT CATH URINE Performed at [facility], [address] [place], [place] [address]    Special Requests   Final    NONE Performed at [facility], [address] [place], [place] [address]    Culture (A)  Final    <10,000 COLONIES/mL INSIGNIFICANT GROWTH Performed at [facility], [address] [place], [place] [address]    Report Status 04/06/2019 FINAL  Final  MRSA PCR Screening     Status: None   Collection Time: 04/04/19  1:36 PM   Specimen: Nasal Mucosa; Nasopharyngeal  Result Value Ref Range Status   MRSA by PCR NEGATIVE NEGATIVE Final    Comment:        The GeneXpert MRSA Assay (FDA approved for NASAL specimens only), is one component of a comprehensive MRSA colonization surveillance program. It is not intended to diagnose MRSA infection nor to guide or monitor treatment for MRSA infections. Performed at [facility], [address], [place], [place] [address]   Culture, blood (single)     Status: None   Collection Time: 04/04/19  2:30 PM   Specimen: BLOOD  Result Value Ref Range Status   Specimen Description   Final    BLOOD LEFT HAND Performed at [facility], [address] [place], [place] [address]    Special Requests   Final    BOTTLES DRAWN AEROBIC ONLY Blood Culture adequate volume Performed at [facility], [address], [place], [place] [address]    Culture   Final    NO GROWTH 5 DAYS Performed at [facility], [address] [place], [place] [address]    Report Status 04/09/2019 FINAL  Final    Radiology Studies: No results found.   Melaysia Streed T. Eraina Winnie [facility]  If 7PM-7AM, please contact  night-coverage [email or web address] Password [email or web address] 04/11/2019, 12:30 PM

## 2019-04-11 NOTE — Progress Notes (Signed)
Inpatient Diabetes Program Recommendations  AACE/ADA: New Consensus Statement on Inpatient Glycemic Control (2015)  Target Ranges:  Prepandial:   less than 140 mg/dL      Peak postprandial:   less than 180 mg/dL (1-2 hours)      Critically ill patients:  140 - 180 mg/dL   Lab Results  Component Value Date   GLUCAP 200 (H) 04/11/2019   HGBA1C 6.1 (H) 04/04/2019    Review of Glycemic Control  Blood sugars trending well.  Educated patient on insulin pen use at home.  Reviewed all steps if insulin pen including attachment of needle, 2-unit air shot, dialing up dose, giving injection, removing needle, disposal of sharps, storage of unused insulin, disposal of insulin etc. Patient able to provide successful return demonstration. Also reviewed troubleshooting with insulin pen. MD to give patient Rxs for insulin pens and insulin pen needles.  Inpatient Diabetes Program Recommendations:     For discharge - recommend Novolin 70/30 pen (ReliOn brand for $25) Prescription for blood glucose meter and supplies, along with pen needles Needs PCP for f/u and diabetes management.  Continue to follow.  Thank you. Lorenda Peck, RD, LDN, CDE Inpatient Diabetes Coordinator [phone]

## 2019-04-12 ENCOUNTER — Inpatient Hospital Stay (HOSPITAL_COMMUNITY): Payer: Self-pay

## 2019-04-12 ENCOUNTER — Other Ambulatory Visit: Payer: Self-pay

## 2019-04-12 DIAGNOSIS — J90 Pleural effusion, not elsewhere classified: Secondary | ICD-10-CM

## 2019-04-12 LAB — BASIC METABOLIC PANEL
Anion gap: 10 (ref 5–15)
BUN: 14 mg/dL (ref 6–20)
CO2: 29 mmol/L (ref 22–32)
Calcium: 8.4 mg/dL — ABNORMAL LOW (ref 8.9–10.3)
Chloride: 97 mmol/L — ABNORMAL LOW (ref 98–111)
Creatinine, Ser: 1.25 mg/dL — ABNORMAL HIGH (ref 0.61–1.24)
GFR calc Af Amer: >60 mL/min (ref >60)
GFR calc non Af Amer: >60 mL/min (ref >60)
Glucose, Bld: 165 mg/dL — ABNORMAL HIGH (ref 70–99)
Potassium: 3.8 mmol/L (ref 3.5–5.1)
Sodium: 136 mmol/L (ref 135–145)

## 2019-04-12 LAB — LACTATE DEHYDROGENASE, PLEURAL OR PERITONEAL FLUID: LD, Fluid: 26 U/L — ABNORMAL HIGH (ref 3–23)

## 2019-04-12 LAB — ALBUMIN, PLEURAL OR PERITONEAL FLUID: Albumin, Fluid: <1 g/dL

## 2019-04-12 LAB — GLUCOSE, CAPILLARY
Glucose-Capillary: 149 mg/dL — ABNORMAL HIGH (ref 70–99)
Glucose-Capillary: 152 mg/dL — ABNORMAL HIGH (ref 70–99)
Glucose-Capillary: 165 mg/dL — ABNORMAL HIGH (ref 70–99)
Glucose-Capillary: 165 mg/dL — ABNORMAL HIGH (ref 70–99)
Glucose-Capillary: 173 mg/dL — ABNORMAL HIGH (ref 70–99)
Glucose-Capillary: 216 mg/dL — ABNORMAL HIGH (ref 70–99)

## 2019-04-12 LAB — CBC
HCT: 30.3 % — ABNORMAL LOW (ref 39.0–52.0)
Hemoglobin: 9.3 g/dL — ABNORMAL LOW (ref 13.0–17.0)
MCH: 29.9 pg (ref 26.0–34.0)
MCHC: 30.7 g/dL (ref 30.0–36.0)
MCV: 97.4 fL (ref 80.0–100.0)
Platelets: 113 10*3/uL — ABNORMAL LOW (ref 150–400)
RBC: 3.11 MIL/uL — ABNORMAL LOW (ref 4.22–5.81)
RDW: 19.5 % — ABNORMAL HIGH (ref 11.5–15.5)
WBC: 5.9 10*3/uL (ref 4.0–10.5)
nRBC: 0 % (ref 0.0–0.2)

## 2019-04-12 LAB — PROTEIN, PLEURAL OR PERITONEAL FLUID: Total protein, fluid: <3 g/dL

## 2019-04-12 LAB — ALBUMIN: Albumin: 3 g/dL — ABNORMAL LOW (ref 3.5–5.0)

## 2019-04-12 LAB — MAGNESIUM: Magnesium: 2.1 mg/dL (ref 1.7–2.4)

## 2019-04-12 LAB — BRAIN NATRIURETIC PEPTIDE: B Natriuretic Peptide: 40.1 pg/mL (ref 0.0–100.0)

## 2019-04-12 LAB — HEPATIC FUNCTION PANEL
ALT: 168 U/L — ABNORMAL HIGH (ref 0–44)
AST: 62 U/L — ABNORMAL HIGH (ref 15–41)
Albumin: 2.9 g/dL — ABNORMAL LOW (ref 3.5–5.0)
Alkaline Phosphatase: 85 U/L (ref 38–126)
Bilirubin, Direct: 0.4 mg/dL — ABNORMAL HIGH (ref 0.0–0.2)
Indirect Bilirubin: 0.6 mg/dL (ref 0.3–0.9)
Total Bilirubin: 1 mg/dL (ref 0.3–1.2)
Total Protein: 5.2 g/dL — ABNORMAL LOW (ref 6.5–8.1)

## 2019-04-12 LAB — AMYLASE, PLEURAL OR PERITONEAL FLUID: Amylase, Fluid: 46 U/L

## 2019-04-12 LAB — GLUCOSE, PLEURAL OR PERITONEAL FLUID: Glucose, Fluid: 209 mg/dL

## 2019-04-12 MED ORDER — GABAPENTIN 300 MG PO CAPS
600.0000 mg | ORAL_CAPSULE | Freq: Every day | ORAL | Status: DC
  Administered 2019-04-12 – 2019-04-15 (×4): 600 mg via ORAL
  Filled 2019-04-12 (×4): qty 2

## 2019-04-12 MED ORDER — GABAPENTIN 300 MG PO CAPS
300.0000 mg | ORAL_CAPSULE | ORAL | Status: DC
  Administered 2019-04-12 – 2019-04-16 (×9): 300 mg via ORAL
  Filled 2019-04-12 (×9): qty 1

## 2019-04-12 MED ORDER — LIDOCAINE HCL 1 % IJ SOLN
INTRAMUSCULAR | Status: AC
  Filled 2019-04-12: qty 20

## 2019-04-12 MED ORDER — BENZONATATE 100 MG PO CAPS
100.0000 mg | ORAL_CAPSULE | Freq: Two times a day (BID) | ORAL | Status: DC
  Administered 2019-04-12 – 2019-04-16 (×6): 100 mg via ORAL
  Filled 2019-04-12 (×8): qty 1

## 2019-04-12 NOTE — Progress Notes (Signed)
PT Cancellation Note  Patient Details Name: SYLVAN SOOKDEO MRN: [id number] DOB: 1960-12-14   Cancelled Treatment:    Reason Eval/Treat Not Completed: Other (comment) Pt reports he has been OOB a few times today for test and walking in room with nursing.  Declined PT due to fatigue.  Will f/u as able.   Mikael Spray Myriam Brandhorst 04/12/2019, 2:06 PM

## 2019-04-12 NOTE — Procedures (Signed)
Ultrasound-guided diagnostic and therapeutic right thoracentesis performed yielding 2 liters of light yellow fluid. No immediate complications. Follow-up chest x-ray pending. A portion of the fluid was sent to the lab for preordered studies. Due to persistent pt coughing only the above amount of fluid was removed today. EBL none.

## 2019-04-12 NOTE — Progress Notes (Signed)
Subjective: Not feeling too well today.  Still struggling with fluid overload.  Objective: Vital signs in last 24 hours: Temp:  [98.4 F (36.9 C)-98.7 F (37.1 C)] 98.4 F (36.9 C) (11/20 0540) Pulse Rate:  [102-103] 102 (11/20 0540) Resp:  [18-20] 18 (11/20 0540) BP: (129-152)/(76-84) 138/76 (11/20 0540) SpO2:  [93 %-94 %] 93 % (11/20 0540) Weight:  [103.7 kg-108.2 kg] 103.7 kg (11/20 1107) Last BM Date: 04/11/19  Intake/Output from previous day: 11/19 0701 - 11/20 0700 In: 840 [P.O.:840] Out: 2825 [Urine:2825] Intake/Output this shift: Total I/O In: -  Out: 500 [Urine:500]  General appearance: alert and no distress GI: soft, non-tender; bowel sounds normal; no masses,  no organomegaly Extremities: 4+ anasarca  Lab Results: Recent Labs    04/10/19 0525 04/11/19 0557 04/12/19 0550  WBC 6.6  --  5.9  HGB 8.4* 8.6* 9.3*  HCT 26.5* 27.8* 30.3*  PLT 92*  --  113*   BMET Recent Labs    04/10/19 0525 04/11/19 0557 04/12/19 0550  NA 133* 131* 136  K 3.9 3.4* 3.8  CL 97* 94* 97*  CO2 29 29 29   GLUCOSE 203* 152* 165*  BUN 16 15 14   CREATININE 1.16 1.16 1.25*  CALCIUM 7.8* 8.0* 8.4*   LFT Recent Labs    04/11/19 0557  PROT 5.1*  ALBUMIN 3.0*  AST 79*  ALT 211*  ALKPHOS 77  BILITOT 1.2   PT/INR No results for input(s): LABPROT, INR in the last 72 hours. Hepatitis Panel No results for input(s): HEPBSAG, HCVAB, HEPAIGM, HEPBIGM in the last 72 hours. C-Diff No results for input(s): CDIFFTOX in the last 72 hours. Fecal Lactopherrin No results for input(s): FECLLACTOFRN in the last 72 hours.  Studies/Results: Abdomen Limited  Result Date: 04/11/2019 CLINICAL DATA:  Ascites. EXAM: LIMITED ABDOMEN ULTRASOUND FOR ASCITES TECHNIQUE: Limited ultrasound survey for ascites was performed in all four abdominal quadrants. COMPARISON:  April 06, 2019. FINDINGS: Minimal amount of ascites is noted around the liver in the right upper quadrant as well as in the  right lower quadrant. No significant fluid pocket for paracentesis is noted. IMPRESSION: Minimal ascites is noted. Electronically Signed   By: 04/13/2019 M.D.   On: 04/11/2019 16:08    Medications:  Scheduled: . dextromethorphan-guaiFENesin  1 tablet Oral BID  . ferrous sulfate  325 mg Oral BID WC  . folic acid  1 mg Oral Daily  . gabapentin  300 mg Oral [id number]  . gabapentin  600 mg Oral QHS  . insulin aspart  0-9 Units Subcutaneous Q4H  . insulin glargine  13 Units Subcutaneous Q0600  . lidocaine  1 patch Transdermal Q24H  . mouth rinse  15 mL Mouth Rinse BID  . multivitamin with minerals  1 tablet Oral Daily  . neomycin-bacitracin-polymyxin   Topical BID  . pantoprazole  40 mg Oral BID  . polyethylene glycol  17 g Oral Daily  . vitamin B-6  50 mg Oral Daily  . spironolactone  200 mg Oral Daily  . thiamine  100 mg Oral Daily   Or  . thiamine  100 mg Intravenous Daily  . torsemide  40 mg Oral Daily  . traZODone  25 mg Oral QHS   Continuous:   Assessment/Plan: 1) Decompensated cirrhosis. 2) Anasarca. 3) Hydrothorax.   The patient has some mild SOB, but but he is not struggling to breath.  He is diuresing well.  The patient's weight is now at 103.7 kg and he was  at 104.9 kg yesterday.  He has dropped approximately 2 lbs which is consistent with his 1745 ml urine output yesterday.  His potassium is stable as well as his creatinine.  It fluctuates between 1.1 and 1.2.    Plan: 1) Continue with Step II diuretics. 2) Continue with 2 gram sodium diet. 3) Monitor BMP.  LOS: 8 days   Aaradhya Kysar D 04/12/2019, 11:54 AM

## 2019-04-12 NOTE — Progress Notes (Signed)
PROGRESS NOTE  Connor Vaughn WRU:[id number] DOB: 01/06/1961   PCP: Nonnie Done., MD  Patient is from: Home.Lives with his wife.  Progressive ambulatory dysfunction.   DOA: 04/04/2019 LOS: 8  Brief Narrative / Interim history: 58 year old male with a history of EtOH/hepatitis C cirrhosis s/p treatment for hepatitis C, ongoing alcohol abuse, frequent syncope/fall, DM-2, depression/anxiety, noncompliance and recurrent right pleural effusion presented 04/04/2019 with fatigue, fall (3 in a week), fever, chills, hematemesis, melanotic diarrhea and physical deconditioning.  He had relapse of alcohol use.  Reportedly drinks 1/5 of whiskey.  No alcohol or recreational drug use.  Patient was admitted for DKA, SIRS with multiple organ failure without clear source of infection, upper GI bleed, AKI, syncope, alcohol abuse/withdrawal.  PCCM and GI consulted. Empirically started on broad-spectrum antibiotics.  Also started on IV insulin and IV fluid hydration.  Had EGD on 11/13 that revealed LA grade D reflux esophagitis with no bleeding, large (> 5 mm) esophageal varices and portal hypertensive gastropathy.  Per GI, bleeding was presumed to be from gastritis versus varices.  He was treated with octreotide drip and PPI.  He is now on oral Protonix.  Patient with significant fluid overload.  Started on Lasix and Aldactone without significant improvement despite good urine output.  Subjective: No major events overnight of this morning.  Does not feel well this morning mainly due to bilateral lower extremity neuropathic pain that he describes as bee stings.  Scrotal swelling worse.  Abdominal pain resolved.  He also reports orthopnea.  He thought he passed out when he lied flat last night.  Cough improved.  He also noted hair loss over his bilateral thighs laterally.  Objective: Vitals:   04/11/19 2217 04/12/19 0500 04/12/19 0540 04/12/19 1107  BP: 129/84  138/76   Pulse: (!) 103  (!) 102   Resp: 18   18   Temp: 98.7 F (37.1 C)  98.4 F (36.9 C)   TempSrc: Oral  Oral   SpO2: 93%  93%   Weight:  108.2 kg  103.7 kg  Height:        Intake/Output Summary (Last 24 hours) at 04/12/2019 1307 Last data filed at 04/12/2019 1258 Gross per 24 hour  Intake 600 ml  Output 3025 ml  Net -2425 ml   Filed Weights   04/11/19 0830 04/12/19 0500 04/12/19 1107  Weight: 104.9 kg 108.2 kg 103.7 kg    Examination:  GENERAL: No acute distress.  Appears well.  HEENT: MMM.  Vision and hearing grossly intact.  NECK: Supple.  No apparent JVD.  RESP:  No IWOB.  Fair air movement bilaterally.  No wheeze or crackles. CVS: Slightly tachycardic.Marland Kitchen Heart sounds normal.  ABD/GI/GU: Bowel sounds present.  Mild distention but no tenderness.  Significant scrotal swelling involving foreskin.  No erythema or tenderness. MSK/EXT:  Moves extremities. No apparent deformity.  Trace edema. SKIN: no apparent skin lesion or wound. Notable hair loss over lateral aspect of both thighs NEURO: Awake, alert and oriented appropriately.  No apparent focal neuro deficit. PSYCH: Calm. Normal affect.  Assessment & Plan: Acute on chronic blood loss anemia due to upper GI bleed: hematemesis and melena Thrombocytopenia-likely due to liver disease. Too quick for HIT -Hgb 15 (04/2018)> 7.0 (admit)> 5.6>2u> 8.1> 7.8> 8.6 -Platelet 233 (admit)> 62>> 113 -Anemia panel consistent with severe iron deficiency with iron saturation of 9% -EGD on 11/13-LA grade D reflux esophagitis with no bleeding, large (> 5 mm) EV and PHG.   -Per GI,  bleeding was presumed to be from gastritis versus varices -GI following-off octreotide.  Continue PPI twice daily for 1 month, then daily. -Encourage alcohol cessation-not ready for rehab.  Will provide resources -Received IV Feraheme 510 mg 11/16.   -Continue p.o. iron with good bowel regimen  History of EtOH/hepatitis C cirrhosis-s/p treatment for hepatitis C Coagulopathy/thrombocytopenia-likely due  to alcohol and cirrhosis-improving Elevated liver enzymes/hyperbilirubinemia-likely due to alcohol +/-ischemia-improving Alcohol use disorder -Discontinue CIWA monitoring-outside withdrawal window. -Multivitamins, thiamine and folic acid -We will provide resources for alcohol cessation -Continue diuretics with torsemide 40 mg daily and Aldactone 200 mg daily -Limited abdominal US without significant ascites.  Fluid overload/anasarca/large right pleural effusion/scrotal edema: Likely due to cirrhosis and hypoalbuminemia.  He echo in 2017 with EF of 65 to 70% but indeterminate diastolic dysfunction.  Reports orthopnea and dry cough.  PND?  Still with significant scrotal edema.  2.8 UOP/24 hours.  Weight down 10 pounds back to diet activity.  Creatinine slightly up.  CXR with new large right pleural effusion, right basilar collapse/consolidation -scrotal ultrasound 11/20 with severe bilateral scrotal wall thickening -Diuretics as above -Monitor fluid status with strict intake and output and a standing weight -Scrotal sling/support-not able to use it -Sodium restriction. -Check BMP -Thoracocentesis and pleural fluid study  Left abdominal wall pain-no splenomegaly or significant ascites on abdominal ultrasound. resolved -Lidoderm patch and fentanyl as needed   Bilateral lower extremity neuropathic pain -Gabapentin 300 mg a.m. and p.m. and 600 mg at bedtime -Continue pyridoxine 50 mg daily  SIRS with multiple organ failure/injury: work-up including COVID-19, blood and urine culture negative.  No signs of SBP.  Improving. -Empirically treated with broad-spectrum antibiotics (vancomycin, cefepime and Flagyl) 11/12-11/14  -Continue monitoring  Cough/dyspnea/wheezing: Never smoker.  No hx of COPD or asthma.  -Thoracocentesis for pleural effusion -Mucolytic's, antitussives and as needed DuoNeb  DKA/uncontrolled DM-2 with hyperglycemia: DKA resolved.  A1c 6.1% on 11/12.  CBG elevated CBG (last  3)  Recent Labs    04/12/19 0414 04/12/19 0737 04/12/19 1158  GLUCAP 152* 149* 173*   -Continue Lantus 13 units -Continue SSI-thin -Change diet to carb modified -Hold off statin due to hepatitis  Hypokalemia: Resolved. -Continue monitoring  AKI: Likely due to dehydration.  Resolved. -Continue monitoring while on high-dose diuretics  Noncompliance with medications: reportedly lost his insurance and has been able to afford? -Counseled.  Will consult case management close to discharge for medication assistance  Morbid obesity: BMI 35.97 -Encourage lifestyle change to lose weight  Recurrent fall at home/syncope/physical deconditioning/debility: due to alcohol and his comorbidiy as above -Fall precautions -Treat treatable causes as above -PT/OT eval and treatment                DVT prophylaxis: SCD in the setting of GI bleed and thrombocytopenia Code Status: Full code Family Communication: Patient and/or RN. Available if any question. Disposition Plan: Remains inpatient due to significant fluid overload/anasarca/pleural effusion/scrotal edema Consultants: PCCM (signed off), GI  Procedures:  11/13-EGD as above  Microbiology summarized: 11/12-COVID-19 screen negative 11/12-blood cultures negative 11/12-urine culture with insignificant gross  Sch Meds:  Scheduled Meds: . dextromethorphan-guaiFENesin  1 tablet Oral BID  . ferrous sulfate  325 mg Oral BID WC  . folic acid  1 mg Oral Daily  . gabapentin  300 mg Oral [id number]  . gabapentin  600 mg Oral QHS  . insulin aspart  0-9 Units Subcutaneous Q4H  . insulin glargine  13 Units Subcutaneous Q0600  . lidocaine  1 patch Transdermal  Q24H  . mouth rinse  15 mL Mouth Rinse BID  . multivitamin with minerals  1 tablet Oral Daily  . neomycin-bacitracin-polymyxin   Topical BID  . pantoprazole  40 mg Oral BID  . polyethylene glycol  17 g Oral Daily  . vitamin B-6  50 mg Oral Daily  . spironolactone  200 mg Oral Daily   . thiamine  100 mg Oral Daily   Or  . thiamine  100 mg Intravenous Daily  . torsemide  40 mg Oral Daily  . traZODone  25 mg Oral QHS   Continuous Infusions: PRN Meds:.fentaNYL (SUBLIMAZE) injection, ipratropium-albuterol, labetalol, ondansetron (ZOFRAN) IV, senna  Antimicrobials: Anti-infectives (From admission, onward)   Start     Dose/Rate Route Frequency Ordered Stop   04/05/19 1500  vancomycin (VANCOCIN) IVPB 1000 mg/200 mL premix  Status:  Discontinued     1,000 mg 200 mL/hr over 60 Minutes Intravenous Every 24 hours 04/04/19 1452 04/06/19 1223   04/05/19 0100  ceFEPIme (MAXIPIME) 2 g in sodium chloride 0.9 % 100 mL IVPB  Status:  Discontinued     2 g 200 mL/hr over 30 Minutes Intravenous Every 12 hours 04/04/19 1452 04/06/19 1223   04/04/19 2200  metroNIDAZOLE (FLAGYL) IVPB 500 mg  Status:  Discontinued     500 mg 100 mL/hr over 60 Minutes Intravenous Every 8 hours 04/04/19 1332 04/06/19 1223   04/04/19 1500  vancomycin (VANCOCIN) 2,000 mg in sodium chloride 0.9 % 500 mL IVPB     2,000 mg 250 mL/hr over 120 Minutes Intravenous NOW 04/04/19 1452 04/04/19 1707   04/04/19 1145  ceFEPIme (MAXIPIME) 2 g in sodium chloride 0.9 % 100 mL IVPB     2 g 200 mL/hr over 30 Minutes Intravenous  Once 04/04/19 1139 04/04/19 1354   04/04/19 1145  metroNIDAZOLE (FLAGYL) IVPB 500 mg     500 mg 100 mL/hr over 60 Minutes Intravenous  Once 04/04/19 1139 04/04/19 1630   04/04/19 1145  vancomycin (VANCOCIN) IVPB 1000 mg/200 mL premix  Status:  Discontinued     1,000 mg 200 mL/hr over 60 Minutes Intravenous  Once 04/04/19 1139 04/04/19 1452       I have personally reviewed the following labs and images: CBC: Recent Labs  Lab 04/07/19 0547 04/08/19 0807 04/09/19 0615 04/10/19 0525 04/11/19 0557 04/12/19 0550  WBC 6.4 5.8 6.0 6.6  --  5.9  HGB 7.8* 8.4* 8.5* 8.4* 8.6* 9.3*  HCT 23.9* 25.7* 26.9* 26.5* 27.8* 30.3*  MCV 90.2 91.5 92.4 93.3  --  97.4  PLT 62* 66* 77* 92*  --  113*    BMP &GFR Recent Labs  Lab 04/06/19 0700 04/07/19 0547 04/08/19 0807 04/09/19 0615 04/10/19 0525 04/11/19 0557 04/12/19 0550  NA 135 136 135 137 133* 131* 136  K 3.3* 3.1* 3.2* 3.8 3.9 3.4* 3.8  CL 104 101 97* 100 97* 94* 97*  CO2 27 28 30 30 29 29 29   GLUCOSE 158* 160* 145* 167* 203* 152* 165*  BUN 31* 22* 19 18 16 15 14   CREATININE 1.38* 1.24 1.10 1.20 1.16 1.16 1.25*  CALCIUM 7.3* 7.6* 7.6* 8.1* 7.8* 8.0* 8.4*  MG 2.0  --  2.1 2.1 2.0 2.3 2.1  PHOS 1.6* 2.4* 2.6 2.9  --  3.5  --    Estimated Creatinine Clearance: 74.5 mL/min (A) (by C-G formula based on SCr of 1.25 mg/dL (H)). Liver & Pancreas: Recent Labs  Lab 04/06/19 0700 04/07/19 0547 04/08/19 0807 04/09/19 0615 04/11/19  0557  AST 940* 763* 470* 244* 79*  ALT 661* 667* 550* 395* 211*  ALKPHOS 58 72 79 84 77  BILITOT 2.2* 2.1* 1.7* 1.1 1.2  PROT 4.6* 4.7* 4.9* 4.9* 5.1*  ALBUMIN 2.8* 2.8* 2.9* 3.0* 3.0*   Recent Labs  Lab 04/09/19 0615 04/10/19 0525  LIPASE 73* 72*   No results for input(s): AMMONIA in the last 168 hours. Diabetic: No results for input(s): HGBA1C in the last 72 hours. Recent Labs  Lab 04/11/19 2029 04/12/19 0052 04/12/19 0414 04/12/19 0737 04/12/19 1158  GLUCAP 200* 165* 152* 149* 173*   Cardiac Enzymes: Recent Labs  Lab 04/09/19 0615  CKTOTAL 97   No results for input(s): PROBNP in the last 8760 hours. Coagulation Profile: Recent Labs  Lab 04/05/19 1945 04/06/19 0700 04/09/19 0615  INR 1.8* 1.7* 1.2   Thyroid Function Tests: No results for input(s): TSH, T4TOTAL, FREET4, T3FREE, THYROIDAB in the last 72 hours. Lipid Profile: No results for input(s): CHOL, HDL, LDLCALC, TRIG, CHOLHDL, LDLDIRECT in the last 72 hours. Anemia Panel: No results for input(s): VITAMINB12, FOLATE, FERRITIN, TIBC, IRON, RETICCTPCT in the last 72 hours. Urine analysis:    Component Value Date/Time   COLORURINE YELLOW 04/04/2019 0911   APPEARANCEUR CLEAR 04/04/2019 0911   LABSPEC 1.017  04/04/2019 0911   PHURINE 5.0 04/04/2019 0911   GLUCOSEU >=500 (A) 04/04/2019 0911   HGBUR NEGATIVE 04/04/2019 0911   BILIRUBINUR NEGATIVE 04/04/2019 0911   KETONESUR 5 (A) 04/04/2019 0911   PROTEINUR NEGATIVE 04/04/2019 0911   NITRITE NEGATIVE 04/04/2019 0911   LEUKOCYTESUR NEGATIVE 04/04/2019 0911   Sepsis Labs: Invalid input(s): PROCALCITONIN, LACTICIDVEN  Microbiology: Recent Results (from the past 240 hour(s))  SARS CORONAVIRUS 2 (TAT 6-24 HRS) Nasopharyngeal Urine, Clean Catch     Status: None   Collection Time: 04/04/19 11:28 AM   Specimen: Urine, Clean Catch; Nasopharyngeal  Result Value Ref Range Status   SARS Coronavirus 2 NEGATIVE NEGATIVE Final    Comment: (NOTE) SARS-CoV-2 target nucleic acids are NOT DETECTED. The SARS-CoV-2 RNA is generally detectable in upper and lower respiratory specimens during the acute phase of infection. Negative results do not preclude SARS-CoV-2 infection, do not rule out co-infections with other pathogens, and should not be used as the sole basis for treatment or other patient management decisions. Negative results must be combined with clinical observations, patient history, and epidemiological information. The expected result is Negative. Fact Sheet for Patients: HairSlick.no Fact Sheet for Healthcare Providers: quierodirigir.com This test is not yet approved or cleared by the [place] FDA and  has been authorized for detection and/or diagnosis of SARS-CoV-2 by FDA under an Emergency Use Authorization (EUA). This EUA will remain  in effect (meaning this test can be used) for the duration of the COVID-19 declaration under Section 56 4(b)(1) of the Act, 21 U.S.C. section 360bbb-3(b)(1), unless the authorization is terminated or revoked sooner. Performed at [facility], [address] [place], [place] [address]   Blood Culture (routine x 2)     Status: None    Collection Time: 04/04/19 12:21 PM   Specimen: BLOOD  Result Value Ref Range Status   Specimen Description   Final    BLOOD RIGHT WRIST Performed at [facility], [address] [place], [place] [address]    Special Requests   Final    BOTTLES DRAWN AEROBIC ONLY Blood Culture adequate volume Performed at [facility], [address] [place], [place] [address]  Culture   Final    NO GROWTH 5 DAYS Performed at [facility], [address] [place], [place] [address]    Report Status 04/09/2019 FINAL  Final  Urine culture     Status: Abnormal   Collection Time: 04/04/19 12:21 PM   Specimen: In/Out Cath Urine  Result Value Ref Range Status   Specimen Description   Final    IN/OUT CATH URINE Performed at [facility], [address] [place], [place] [address]    Special Requests   Final    NONE Performed at [facility], [address] [place], [place] [address]    Culture (A)  Final    <10,000 COLONIES/mL INSIGNIFICANT GROWTH Performed at [facility], [address] [place], [place] [address]    Report Status 04/06/2019 FINAL  Final  MRSA PCR Screening     Status: None   Collection Time: 04/04/19  1:36 PM   Specimen: Nasal Mucosa; Nasopharyngeal  Result Value Ref Range Status   MRSA by PCR NEGATIVE NEGATIVE Final    Comment:        The GeneXpert MRSA Assay (FDA approved for NASAL specimens only), is one component of a comprehensive MRSA colonization surveillance program. It is not intended to diagnose MRSA infection nor to guide or monitor treatment for MRSA infections. Performed at [facility], [address] [place], [place] [address]   Culture, blood (single)     Status: None   Collection Time: 04/04/19  2:30 PM   Specimen: BLOOD  Result Value Ref Range Status   Specimen Description   Final    BLOOD LEFT HAND Performed at [facility], [address] [place], [place] [address]    Special Requests   Final    BOTTLES DRAWN AEROBIC ONLY Blood Culture adequate volume Performed at [facility], [address] [place], [place] [address]    Culture   Final    NO GROWTH 5 DAYS Performed at [facility], [address] [place], [place] [address]    Report Status 04/09/2019 FINAL  Final    Radiology Studies: Dg Chest 2 View  Result Date: 04/12/2019 CLINICAL DATA:  Nausea, vomiting, weakness and cough. EXAM: CHEST - 2 VIEW COMPARISON:  04/04/2019 and CT chest 12/24/2015. FINDINGS: Trachea is midline. Heart size is grossly stable. New large right pleural effusion with right basilar collapse/consolidation. Left lung is clear. IMPRESSION: New large right pleural effusion with right basilar collapse/consolidation. Electronically Signed   By: Leanna BattlesMelinda  Blietz M.D.   On: 04/12/2019 11:55   [place] Scrotum  Result Date: 04/12/2019 CLINICAL DATA:  Edema EXAM: SCROTAL ULTRASOUND DOPPLER ULTRASOUND OF THE TESTICLES TECHNIQUE: Complete ultrasound examination of the testicles, epididymis, and other scrotal structures was performed. Color and spectral Doppler ultrasound were also utilized to evaluate blood flow to the testicles. COMPARISON:  None. FINDINGS: Right testicle Measurements: 3.9 x 2.9 x 2.9 cm. No mass or microlithiasis visualized. Left testicle Measurements: 4 x 2.6 x 2.2 cm. No mass or microlithiasis visualized. Right epididymis: Right epididymal cyst measuring 9.5 x 3.1 x 5.3 cm. Left epididymis:  Normal in size and appearance. Hydrocele:  None visualized. Varicocele:  None visualized. Pulsed Doppler interrogation of both testes demonstrates normal low resistance arterial and venous waveforms bilaterally. Other: Bilateral scrotal wall thickening. IMPRESSION: 1. No testicular torsion. 2. Bilateral severe scrotal wall thickening which may reflect cellulitis or secondary to fluid overload. No  drainable  fluid collection. Electronically Signed   By: Elige Ko   On: 04/12/2019 12:35   US Abdomen Limited  Result Date: 04/11/2019 CLINICAL DATA:  Ascites. EXAM: LIMITED ABDOMEN ULTRASOUND FOR ASCITES TECHNIQUE: Limited ultrasound survey for ascites was performed in all four abdominal quadrants. COMPARISON:  April 06, 2019. FINDINGS: Minimal amount of ascites is noted around the liver in the right upper quadrant as well as in the right lower quadrant. No significant fluid pocket for paracentesis is noted. IMPRESSION: Minimal ascites is noted. Electronically Signed   By: Lupita Raider M.D.   On: 04/11/2019 16:08     Berkley Cronkright T. Joretta Eads [facility]  If 7PM-7AM, please contact night-coverage [email or web address] Password [email or web address] 04/12/2019, 1:07 PM

## 2019-04-12 NOTE — Progress Notes (Signed)
OT Cancellation Note  Patient Details Name: Connor Vaughn MRN: [id number] DOB: 08/03/1960   Cancelled Treatment:    Reason Eval/Treat Not Completed: Other (comment).  Transporter present to take pt for thoracentesis.  Issued toilet aide for hygiene and explained use.  [name] 04/12/2019, 3:56 PM  Lesle Chris, OTR/L Acute Rehabilitation Services [phone] [facility] pager [phone] office 04/12/2019

## 2019-04-13 DIAGNOSIS — R601 Generalized edema: Secondary | ICD-10-CM

## 2019-04-13 DIAGNOSIS — R188 Other ascites: Secondary | ICD-10-CM

## 2019-04-13 DIAGNOSIS — E8809 Other disorders of plasma-protein metabolism, not elsewhere classified: Secondary | ICD-10-CM

## 2019-04-13 DIAGNOSIS — K7469 Other cirrhosis of liver: Secondary | ICD-10-CM

## 2019-04-13 DIAGNOSIS — Y92009 Unspecified place in unspecified non-institutional (private) residence as the place of occurrence of the external cause: Secondary | ICD-10-CM

## 2019-04-13 DIAGNOSIS — F1010 Alcohol abuse, uncomplicated: Secondary | ICD-10-CM

## 2019-04-13 DIAGNOSIS — W19XXXA Unspecified fall, initial encounter: Secondary | ICD-10-CM

## 2019-04-13 LAB — GLUCOSE, CAPILLARY
Glucose-Capillary: 111 mg/dL — ABNORMAL HIGH (ref 70–99)
Glucose-Capillary: 143 mg/dL — ABNORMAL HIGH (ref 70–99)
Glucose-Capillary: 148 mg/dL — ABNORMAL HIGH (ref 70–99)
Glucose-Capillary: 154 mg/dL — ABNORMAL HIGH (ref 70–99)
Glucose-Capillary: 157 mg/dL — ABNORMAL HIGH (ref 70–99)
Glucose-Capillary: 162 mg/dL — ABNORMAL HIGH (ref 70–99)
Glucose-Capillary: 208 mg/dL — ABNORMAL HIGH (ref 70–99)

## 2019-04-13 LAB — MAGNESIUM: Magnesium: 1.9 mg/dL (ref 1.7–2.4)

## 2019-04-13 LAB — BASIC METABOLIC PANEL
Anion gap: 7 (ref 5–15)
BUN: 16 mg/dL (ref 6–20)
CO2: 32 mmol/L (ref 22–32)
Calcium: 8.2 mg/dL — ABNORMAL LOW (ref 8.9–10.3)
Chloride: 96 mmol/L — ABNORMAL LOW (ref 98–111)
Creatinine, Ser: 1.33 mg/dL — ABNORMAL HIGH (ref 0.61–1.24)
GFR calc Af Amer: >60 mL/min (ref >60)
GFR calc non Af Amer: 59 mL/min — ABNORMAL LOW (ref >60)
Glucose, Bld: 139 mg/dL — ABNORMAL HIGH (ref 70–99)
Potassium: 3.6 mmol/L (ref 3.5–5.1)
Sodium: 135 mmol/L (ref 135–145)

## 2019-04-13 LAB — HEMOGLOBIN AND HEMATOCRIT, BLOOD
HCT: 30.5 % — ABNORMAL LOW (ref 39.0–52.0)
Hemoglobin: 9.4 g/dL — ABNORMAL LOW (ref 13.0–17.0)

## 2019-04-13 LAB — LACTATE DEHYDROGENASE: LDH: 224 U/L — ABNORMAL HIGH (ref 98–192)

## 2019-04-13 MED ORDER — HYDROCOD POLST-CPM POLST ER 10-8 MG/5ML PO SUER
5.0000 mL | Freq: Two times a day (BID) | ORAL | Status: DC | PRN
  Administered 2019-04-13 – 2019-04-15 (×4): 5 mL via ORAL
  Filled 2019-04-13 (×4): qty 5

## 2019-04-13 MED ORDER — MUSCLE RUB 10-15 % EX CREA
TOPICAL_CREAM | CUTANEOUS | Status: DC | PRN
  Administered 2019-04-13: 1 via TOPICAL
  Filled 2019-04-13: qty 85

## 2019-04-13 MED ORDER — SPIRONOLACTONE 100 MG PO TABS
100.0000 mg | ORAL_TABLET | Freq: Every day | ORAL | Status: DC
  Administered 2019-04-13 – 2019-04-16 (×4): 100 mg via ORAL
  Filled 2019-04-13 (×4): qty 1

## 2019-04-13 MED ORDER — TROLAMINE SALICYLATE 10 % EX CREA: TOPICAL_CREAM | Freq: Two times a day (BID) | CUTANEOUS | Status: DC | PRN

## 2019-04-13 MED ORDER — TORSEMIDE 20 MG PO TABS
20.0000 mg | ORAL_TABLET | Freq: Every day | ORAL | Status: DC
  Administered 2019-04-13 – 2019-04-15 (×3): 20 mg via ORAL
  Filled 2019-04-13 (×3): qty 1

## 2019-04-13 MED ORDER — TRAZODONE HCL 50 MG PO TABS
50.0000 mg | ORAL_TABLET | Freq: Every day | ORAL | Status: DC
  Administered 2019-04-13 – 2019-04-15 (×3): 50 mg via ORAL
  Filled 2019-04-13 (×3): qty 1

## 2019-04-13 MED ORDER — ALBUMIN HUMAN 25 % IV SOLN
25.0000 g | Freq: Four times a day (QID) | INTRAVENOUS | Status: AC
  Administered 2019-04-13 (×3): 25 g via INTRAVENOUS
  Filled 2019-04-13 (×3): qty 100

## 2019-04-13 NOTE — Progress Notes (Addendum)
Patient ID: Connor Vaughn, male   DOB: 08-03-1960, 58 y.o.   MRN: [id number]    Progress Note   Subjective  Covering weekend for Dr. Benson Norway  Day # 9 CC; decompensated cirrhosis/anasarca/hepatic hydrothorax-admitted with DKA and SIRS  Creat 1.3 rising  Status post right thoracentesis yesterday 2 L removed-culture pending, no cell counts  Patient says he is feeling better today, breathing better and able to talk better is not short of breath.  Gradually decreasing abdominal girth.  He says he still has a lot of swelling of the scrotum and bilateral lower extremities up into his thighs.  He is also complaining of burning tingling pain bilateral anterior thighs.     Objective   Vital signs in last 24 hours: Temp:  [97.8 F (36.6 C)-98.4 F (36.9 C)] 97.8 F (36.6 C) (11/21 0636) Pulse Rate:  [87-106] 87 (11/21 0636) Resp:  [17-18] 18 (11/21 0636) BP: (116-152)/(74-89) 126/89 (11/21 0636) SpO2:  [94 %-100 %] 100 % (11/21 0636) Last BM Date: 04/11/19 General:    white male in NAD Heart:  Regular rate and rhythm; no murmurs Lungs: Respirations even and unlabored, lungs CTA bilaterally Abdomen:  Soft, nontender and nondistended. Normal bowel sounds. Extremities:  Without edema. Neurologic:  Alert and oriented,  grossly normal neurologically. Psych:  Cooperative. Normal mood and affect.  Intake/Output from previous day: 11/20 0701 - 11/21 0700 In: 360 [P.O.:360] Out: 2925 [Urine:2925] Intake/Output this shift: Total I/O In: 240 [P.O.:240] Out: 1650 [Urine:1650]  Lab Results: Recent Labs    04/11/19 0557 04/12/19 0550 04/13/19 0611  WBC  --  5.9  --   HGB 8.6* 9.3* 9.4*  HCT 27.8* 30.3* 30.5*  PLT  --  113*  --    BMET Recent Labs    04/11/19 0557 04/12/19 0550 04/13/19 0611  NA 131* 136 135  K 3.4* 3.8 3.6  CL 94* 97* 96*  CO2 29 29 32  GLUCOSE 152* 165* 139*  BUN 15 14 16   CREATININE 1.16 1.25* 1.33*  CALCIUM 8.0* 8.4* 8.2*   LFT Recent Labs     04/12/19 0550  PROT 5.2*  ALBUMIN 2.9*  3.0*  AST 62*  ALT 168*  ALKPHOS 85  BILITOT 1.0  BILIDIR 0.4*  IBILI 0.6   PT/INR No results for input(s): LABPROT, INR in the last 72 hours.  Studies/Results: Dg Chest 1 View  Result Date: 04/12/2019 CLINICAL DATA:  Status post thoracentesis EXAM: CHEST  1 VIEW COMPARISON:  04/12/2019, 11:45 a.m. FINDINGS: Significant interval reduction in volume in a now small, layering right pleural effusion with associated atelectasis or consolidation. No significant pneumothorax. The left lung is normally aerated. The heart and mediastinum are normal. IMPRESSION: Significant interval reduction in volume in a now small, layering right pleural effusion with associated atelectasis or consolidation. No significant pneumothorax. Electronically Signed   By: Eddie Candle M.D.   On: 04/12/2019 16:55   Dg Chest 2 View  Result Date: 04/12/2019 CLINICAL DATA:  Nausea, vomiting, weakness and cough. EXAM: CHEST - 2 VIEW COMPARISON:  04/04/2019 and CT chest 12/24/2015. FINDINGS: Trachea is midline. Heart size is grossly stable. New large right pleural effusion with right basilar collapse/consolidation. Left lung is clear. IMPRESSION: New large right pleural effusion with right basilar collapse/consolidation. Electronically Signed   By: Lorin Picket M.D.   On: 04/12/2019 11:55   US Scrotum  Result Date: 04/12/2019 CLINICAL DATA:  Edema EXAM: SCROTAL ULTRASOUND DOPPLER ULTRASOUND OF THE TESTICLES TECHNIQUE: Complete ultrasound examination of  the testicles, epididymis, and other scrotal structures was performed. Color and spectral Doppler ultrasound were also utilized to evaluate blood flow to the testicles. COMPARISON:  None. FINDINGS: Right testicle Measurements: 3.9 x 2.9 x 2.9 cm. No mass or microlithiasis visualized. Left testicle Measurements: 4 x 2.6 x 2.2 cm. No mass or microlithiasis visualized. Right epididymis: Right epididymal cyst measuring 9.5 x 3.1 x 5.3 cm.  Left epididymis:  Normal in size and appearance. Hydrocele:  None visualized. Varicocele:  None visualized. Pulsed Doppler interrogation of both testes demonstrates normal low resistance arterial and venous waveforms bilaterally. Other: Bilateral scrotal wall thickening. IMPRESSION: 1. No testicular torsion. 2. Bilateral severe scrotal wall thickening which may reflect cellulitis or secondary to fluid overload. No drainable fluid collection. Electronically Signed   By: Elige Ko   On: 04/12/2019 12:35   US Abdomen Limited  Result Date: 04/11/2019 CLINICAL DATA:  Ascites. EXAM: LIMITED ABDOMEN ULTRASOUND FOR ASCITES TECHNIQUE: Limited ultrasound survey for ascites was performed in all four abdominal quadrants. COMPARISON:  April 06, 2019. FINDINGS: Minimal amount of ascites is noted around the liver in the right upper quadrant as well as in the right lower quadrant. No significant fluid pocket for paracentesis is noted. IMPRESSION: Minimal ascites is noted. Electronically Signed   By: Lupita Raider M.D.   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 EXAM: ULTRASOUND GUIDED DIAGNOSTIC AND THERAPEUTIC RIGHT THORACENTESIS MEDICATIONS: None COMPLICATIONS: None immediate. PROCEDURE: An ultrasound guided thoracentesis was thoroughly discussed with the patient and questions answered. The benefits, risks, alternatives and complications were also discussed. The patient understands and wishes to proceed with the procedure. Written consent was obtained. Ultrasound was performed to localize and mark an adequate pocket of fluid in the right chest. The area was then prepped and draped in the normal sterile fashion. 1% Lidocaine was used for local anesthesia. Under ultrasound  guidance a 6 Fr Safe-T-Centesis catheter was introduced. Thoracentesis was performed. The catheter was removed and a dressing applied. FINDINGS: A total of approximately 2 liters of light yellow fluid was removed. Samples were sent to the laboratory as requested by the clinical team. Due to persistent patient coughing only the above amount of fluid was removed today. IMPRESSION: Successful ultrasound guided diagnostic and therapeutic right thoracentesis yielding 2 liters of pleural fluid. Read by: Jeananne Rama, PA-C Electronically Signed   By: Richarda Overlie M.D.   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 Also with anasarca and recurrent right pleural effusion currently undergoing diuresis  Patient is stable, tolerating diuretics. He had been off meds at home over the past several months due to lack of insurance.  He tells me he had a right pleural effusion about a year and a half ago and anasarca and did well with diuretics over time.  Stable status post thoracentesis yesterday Mild bump in creatinine  #2 LA grade D reflux esophagitis, and large nonbleeding esophageal varices, portal gastropathy on EGD 04/05/2019-on Protonix currently  #3 anemia-stable   Plan; continue 2 g sodium diet Continue current diuretic regimen with Aldactone and Demadex. Daily BMET Resume physical therapy    Active Problems:   DKA (  diabetic ketoacidoses) (HCC)   Encounter for central line placement   Sepsis with acute renal failure without septic shock (HCC)   Acute blood loss anemia   Pancreatitis   Lactic acidosis   Upper GI bleed     LOS: 9 days   Amy Esterwood PA-C 04/13/2019, 2:16 PM   I have discussed the case with the PA, and that is the plan I formulated. I personally interviewed and examined the patient.  CC: Ascites, edema, anasarca  It seems  like this patient is making very slow progress due to the severity of his underlying condition and concerns about causing renal dysfunction with diuretics.  He still has tremendous volume overload on exam including legs scrotum abdomen and pleural fusion that was tapped yesterday.  I would not escalate his diuretics at this point.  It seems like he is slowly diuresing.  Dr. Elnoria HowardHung will return on Monday and can consider best plan for diuretics versus consideration of TIPS placement.  Total time 25 minutes  Charlie PitterHenry L Danis III Office: [phone]

## 2019-04-13 NOTE — Progress Notes (Signed)
PROGRESS NOTE  Connor Vaughn JYN:[id number] DOB: 1961/05/13   PCP: Nonnie Done., MD  Patient is from: Home.Lives with his wife.  Progressive ambulatory dysfunction.   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  He had relapse of alcohol use.  Reportedly drinks 1/5 of whiskey.  No alcohol or recreational drug use.  Patient was admitted for DKA, SIRS with multiple organ failure without clear source of infection, upper GI bleed, AKI, syncope, alcohol abuse/withdrawal.  PCCM and GI consulted. Empirically started on broad-spectrum antibiotics.  Also started on IV insulin and IV fluid hydration.  Had EGD on 11/13 that revealed LA grade D reflux esophagitis with no bleeding, large (> 5 mm) esophageal varices and portal hypertensive gastropathy.  Per GI, bleeding was presumed to be from gastritis versus varices.  He was treated with octreotide drip and PPI.  He is now on oral Protonix.  Patient with significant fluid overload and scrotal edema..  Started on Lasix and Aldactone without significant improvement despite good urine output.  CXR on 04/12/2019 revealed large right-sided pleural effusion.  He underwent ultrasound-guided thoracocentesis with removal of 2 L of transudative fluid.   Subjective: No major events overnight of this morning.  Reports improvement in his cough.  Scrotal swelling worse.  Still with bilateral neuropathic pain mainly over his thighs.  Objective: Vitals:   04/12/19 1604 04/12/19 1634 04/12/19 2017 04/13/19 0636  BP: (!) 144/74 (!) 152/87 116/78 126/89  Pulse:   (!) 106 87  Resp:   17 18  Temp:   98.4 F  (36.9 C) 97.8 F (36.6 C)  TempSrc:   Oral Oral  SpO2:   94% 100%  Weight:      Height:        Intake/Output Summary (Last 24 hours) at 04/13/2019 1229 Last data filed at 04/13/2019 1100 Gross per 24 hour  Intake 240 ml  Output 3075 ml  Net -2835 ml   Filed Weights   04/11/19 0830 04/12/19 0500 04/12/19 1107  Weight: 104.9 kg 108.2 kg 103.7 kg    Examination:  GENERAL: No acute distress.  Appears well.  HEENT: MMM.  Vision and hearing grossly intact.  NECK: Supple.  No apparent JVD.  RESP:  No IWOB.  Fair air movement bilaterally.  No wheeze or crackles. CVS:  RRR. Heart sounds normal.  ABD/GI/GU: Bowel sounds present. Soft. Non tender.  Significant scrotal edema.  No erythema or tenderness. MSK/EXT:  Moves extremities. No apparent deformity or edema.  SKIN: no apparent skin lesion or wound NEURO: Awake, alert and oriented appropriately.  No apparent focal neuro deficit. PSYCH: Calm. Normal affect.   Assessment & Plan: Acute on chronic blood loss anemia due to upper GI bleed: hematemesis and melena Thrombocytopenia-likely due to liver disease. Too quick for HIT -Hgb 15 (04/2018)> 7.0 (admit)> 5.6>2u> 8.1> 7.8> 9.4 -Platelet 233 (admit)> 62>> 113 -Anemia panel consistent with severe iron deficiency with iron saturation of 9% -EGD on 11/13-LA grade D reflux esophagitis with no bleeding, large (> 5 mm) EV and PHG.   -Per GI, bleeding was presumed to be from gastritis versus varices -GI following-off octreotide.  Continue PPI twice daily for 1 month, then daily. -Encourage alcohol cessation-not ready for rehab.  Will provide resources -Received IV Feraheme 510 mg 11/16.   -Continue p.o. iron with good bowel regimen  History of EtOH/hepatitis C cirrhosis-s/p treatment for hepatitis C Coagulopathy/thrombocytopenia-likely due to alcohol and cirrhosis-improving Elevated liver enzymes/hyperbilirubinemia-likely due to alcohol +/-ischemia-improving Alcohol use  disorder -Discontinue CIWA monitoring-outside withdrawal window. -Multivitamins, thiamine and folic acid -We will provide resources for alcohol cessation -Reduce torsemide to 20 mg daily and Aldactone to 100 mg daily-renal function slightly worse. -Limited abdominal US without significant ascites.  Fluid overload/anasarca/large right pleural effusion/scrotal edema: Likely due to cirrhosis and hypoalbuminemia.  Echo in 2017 with EF of 65 to 70% but indeterminate diastolic dysfunction.  Scrotal edema worse despite good urine output, 3 L / 24 hours.  Scrotal ultrasound reveals edema.  KUB without significant ascites. Creatinine slightly up.  CXR 11/20 with large right pleural effusion.  Thoracocentesis on 11/20 yielded 2 L transudative fluid. -Adjusted diuretics as above. -Monitor fluid status with strict intake and output and a standing weight -Scrotal sling/support-not able to use it -Sodium restriction. -We will give albumin 25 g every 6 hours x3.  Bilateral lower extremity neuropathic pain -Gabapentin 300 mg a.m. and p.m. and 600 mg at bedtime -We will try Aspercreme-if no improvement, Lidoderm patch  SIRS with multiple organ failure/injury: COVID-19 and cultures negative.  No signs of SBP.  Resolved. -Empirically treated with broad-spectrum antibiotics (vancomycin, cefepime and Flagyl) 11/12-11/14  -Continue monitoring  Cough/dyspnea/wheezing: Never smoker.  No hx of COPD or asthma.  Likely due to large pleural effusion. -Mucolytic's, antitussives and as needed DuoNeb -Status post thoracocentesis.  Left abdominal wall pain-no splenomegaly or significant ascites on abdominal ultrasound. resolved   DKA/uncontrolled DM-2 with hyperglycemia: DKA resolved.  A1c 6.1% on 11/12.  CBG elevated CBG (last 3)  Recent Labs    04/13/19 0411 04/13/19 0734 04/13/19 1200  GLUCAP 148* 111* 143*   -Continue Lantus 13 units -Continue SSI-thin -Hold off statin due to hepatitis  Hypokalemia:  Resolved. -Replenish and recheck as appropriate  AKI: Likely due to dehydration.  Resolved but slight uptrend in creatinine again after diuretics -Continue monitoring -Adjusted diuretics as above.  Noncompliance with medications: reportedly lost his insurance and has been able to afford? -Counseled.  Will consult case management close to discharge for medication assistance  Morbid obesity: BMI 35.97 -Encourage lifestyle change to lose weight  Recurrent fall at home/syncope/physical deconditioning/debility: due to alcohol and his comorbidiy as above -Fall precautions -Treat treatable causes as above -PT/OT eval and treatment                DVT prophylaxis: SCD in the setting of GI bleed and thrombocytopenia Code Status: Full code Family Communication: Patient and/or RN. Available if any question. Disposition Plan: Remains inpatient due to significant fluid overload/anasarca/pleural effusion/scrotal edema Consultants: PCCM (signed off), GI  Procedures:  11/13-EGD as above  Microbiology summarized: 11/12-COVID-19 screen negative 11/12-blood cultures negative 11/12-urine culture with insignificant gross  Sch Meds:  Scheduled Meds: . benzonatate  100 mg Oral BID  . dextromethorphan-guaiFENesin  1 tablet Oral BID  . ferrous sulfate  325 mg Oral BID WC  . folic acid  1 mg Oral Daily  . gabapentin  300 mg Oral [id number]  . gabapentin  600 mg Oral QHS  . insulin aspart  0-9 Units Subcutaneous Q4H  . insulin glargine  13 Units Subcutaneous Q0600  . lidocaine  1 patch Transdermal Q24H  . mouth rinse  15 mL Mouth Rinse BID  . multivitamin with minerals  1 tablet  Oral Daily  . neomycin-bacitracin-polymyxin   Topical BID  . pantoprazole  40 mg Oral BID  . polyethylene glycol  17 g Oral Daily  . spironolactone  100 mg Oral Daily  . thiamine  100 mg Oral Daily   Or  . thiamine  100 mg Intravenous Daily  . torsemide  20 mg Oral Daily  . traZODone  50 mg Oral QHS    Continuous Infusions: . albumin human 25 g (04/13/19 1018)   PRN Meds:.chlorpheniramine-HYDROcodone, fentaNYL (SUBLIMAZE) injection, ipratropium-albuterol, labetalol, Muscle Rub, ondansetron (ZOFRAN) IV, senna  Antimicrobials: Anti-infectives (From admission, onward)   Start     Dose/Rate Route Frequency Ordered Stop   04/05/19 1500  vancomycin (VANCOCIN) IVPB 1000 mg/200 mL premix  Status:  Discontinued     1,000 mg 200 mL/hr over 60 Minutes Intravenous Every 24 hours 04/04/19 1452 04/06/19 1223   04/05/19 0100  ceFEPIme (MAXIPIME) 2 g in sodium chloride 0.9 % 100 mL IVPB  Status:  Discontinued     2 g 200 mL/hr over 30 Minutes Intravenous Every 12 hours 04/04/19 1452 04/06/19 1223   04/04/19 2200  metroNIDAZOLE (FLAGYL) IVPB 500 mg  Status:  Discontinued     500 mg 100 mL/hr over 60 Minutes Intravenous Every 8 hours 04/04/19 1332 04/06/19 1223   04/04/19 1500  vancomycin (VANCOCIN) 2,000 mg in sodium chloride 0.9 % 500 mL IVPB     2,000 mg 250 mL/hr over 120 Minutes Intravenous NOW 04/04/19 1452 04/04/19 1707   04/04/19 1145  ceFEPIme (MAXIPIME) 2 g in sodium chloride 0.9 % 100 mL IVPB     2 g 200 mL/hr over 30 Minutes Intravenous  Once 04/04/19 1139 04/04/19 1354   04/04/19 1145  metroNIDAZOLE (FLAGYL) IVPB 500 mg     500 mg 100 mL/hr over 60 Minutes Intravenous  Once 04/04/19 1139 04/04/19 1630   04/04/19 1145  vancomycin (VANCOCIN) IVPB 1000 mg/200 mL premix  Status:  Discontinued     1,000 mg 200 mL/hr over 60 Minutes Intravenous  Once 04/04/19 1139 04/04/19 1452       I have personally reviewed the following labs and images: CBC: Recent Labs  Lab 04/07/19 0547 04/08/19 0807 04/09/19 0615 04/10/19 0525 04/11/19 0557 04/12/19 0550 04/13/19 0611  WBC 6.4 5.8 6.0 6.6  --  5.9  --   HGB 7.8* 8.4* 8.5* 8.4* 8.6* 9.3* 9.4*  HCT 23.9* 25.7* 26.9* 26.5* 27.8* 30.3* 30.5*  MCV 90.2 91.5 92.4 93.3  --  97.4  --   PLT 62* 66* 77* 92*  --  113*  --    BMP &GFR Recent  Labs  Lab 04/07/19 0547  04/08/19 0807 04/09/19 0615 04/10/19 0525 04/11/19 0557 04/12/19 0550 04/13/19 0611  NA 136  --  135 137 133* 131* 136 135  K 3.1*  --  3.2* 3.8 3.9 3.4* 3.8 3.6  CL 101  --  97* 100 97* 94* 97* 96*  CO2 28  --  32  GLUCOSE 160*  --  145* 167* 203* 152* 165* 139*  BUN 22*  --  CREATININE 1.24  --  1.10 1.20 1.16 1.16 1.25* 1.33*  CALCIUM 7.6*  --  7.6* 8.1* 7.8* 8.0* 8.4* 8.2*  MG  --    < > 2.1 2.1 2.0 2.3 2.1 1.9  PHOS 2.4*  --  2.6 2.9  --  3.5  --   --    < > =  values in this interval not displayed.   Estimated Creatinine Clearance: 70 mL/min (A) (by C-G formula based on SCr of 1.33 mg/dL (H)). Liver & Pancreas: Recent Labs  Lab 04/07/19 0547 04/08/19 0807 04/09/19 0615 04/11/19 0557 04/12/19 0550  AST 763* 470* 244* 79* 62*  ALT 667* 550* 395* 211* 168*  ALKPHOS 72 79 84 77 85  BILITOT 2.1* 1.7* 1.1 1.2 1.0  PROT 4.7* 4.9* 4.9* 5.1* 5.2*  ALBUMIN 2.8* 2.9* 3.0* 3.0* 2.9*  3.0*   Recent Labs  Lab 04/09/19 0615 04/10/19 0525  LIPASE 73* 72*   No results for input(s): AMMONIA in the last 168 hours. Diabetic: No results for input(s): HGBA1C in the last 72 hours. Recent Labs  Lab 04/12/19 2014 04/13/19 0025 04/13/19 0411 04/13/19 0734 04/13/19 1200  GLUCAP 165* 162* 148* 111* 143*   Cardiac Enzymes: Recent Labs  Lab 04/09/19 0615  CKTOTAL 97   No results for input(s): PROBNP in the last 8760 hours. Coagulation Profile: Recent Labs  Lab 04/09/19 0615  INR 1.2   Thyroid Function Tests: No results for input(s): TSH, T4TOTAL, FREET4, T3FREE, THYROIDAB in the last 72 hours. Lipid Profile: No results for input(s): CHOL, HDL, LDLCALC, TRIG, CHOLHDL, LDLDIRECT in the last 72 hours. Anemia Panel: No results for input(s): VITAMINB12, FOLATE, FERRITIN, TIBC, IRON, RETICCTPCT in the last 72 hours. Urine analysis:    Component Value Date/Time   COLORURINE YELLOW 04/04/2019 0911   APPEARANCEUR  CLEAR 04/04/2019 0911   LABSPEC 1.017 04/04/2019 0911   PHURINE 5.0 04/04/2019 0911   GLUCOSEU >=500 (A) 04/04/2019 0911   HGBUR NEGATIVE 04/04/2019 0911   BILIRUBINUR NEGATIVE 04/04/2019 0911   KETONESUR 5 (A) 04/04/2019 0911   PROTEINUR NEGATIVE 04/04/2019 0911   NITRITE NEGATIVE 04/04/2019 0911   LEUKOCYTESUR NEGATIVE 04/04/2019 0911   Sepsis Labs: Invalid input(s): PROCALCITONIN, [place]  Microbiology: Recent Results (from the past 240 hour(s))  SARS CORONAVIRUS 2 (TAT 6-24 HRS) Nasopharyngeal Urine, Clean Catch     Status: None   Collection Time: 04/04/19 11:28 AM   Specimen: Urine, Clean Catch; Nasopharyngeal  Result Value Ref Range Status   SARS Coronavirus 2 NEGATIVE NEGATIVE Final    Comment: (NOTE) SARS-CoV-2 target nucleic acids are NOT DETECTED. The SARS-CoV-2 RNA is generally detectable in upper and lower respiratory specimens during the acute phase of infection. Negative results do not preclude SARS-CoV-2 infection, do not rule out co-infections with other pathogens, and should not be used as the sole basis for treatment or other patient management decisions. Negative results must be combined with clinical observations, patient history, and epidemiological information. The expected result is Negative. Fact Sheet for Patients: SugarRoll.be Fact Sheet for Healthcare Providers: https://www.woods-mathews.com/ This test is not yet approved or cleared by the [place] FDA and  has been authorized for detection and/or diagnosis of SARS-CoV-2 by FDA under an Emergency Use Authorization (EUA). This EUA will remain  in effect (meaning this test can be used) for the duration of the COVID-19 declaration under Section 56 4(b)(1) of the Act, 21 U.S.C. section 360bbb-3(b)(1), unless the authorization is terminated or revoked sooner. Performed at [facility], [place] [address], [place], [place] [address]   Blood Culture  (routine x 2)     Status: None   Collection Time: 04/04/19 12:21 PM   Specimen: BLOOD  Result Value Ref Range Status   Specimen Description   Final    BLOOD RIGHT WRIST Performed at [facility] [address] [place],  [place] [address]    Special Requests   Final    BOTTLES DRAWN AEROBIC ONLY Blood Culture adequate volume Performed at [facility], [address] [place], [place] [address]    Culture   Final    NO GROWTH 5 DAYS Performed at [facility], [address] [place], [place] [address]    Report Status 04/09/2019 FINAL  Final  Urine culture     Status: Abnormal   Collection Time: 04/04/19 12:21 PM   Specimen: In/Out Cath Urine  Result Value Ref Range Status   Specimen Description   Final    IN/OUT CATH URINE Performed at [facility], [address], [place], [place] [address]    Special Requests   Final    NONE Performed at [facility], [address] [place], [place] [address]    Culture (A)  Final    <10,000 COLONIES/mL INSIGNIFICANT GROWTH Performed at [facility], [address], [place], [place] [address]    Report Status 04/06/2019 FINAL  Final  MRSA PCR Screening     Status: None   Collection Time: 04/04/19  1:36 PM   Specimen: Nasal Mucosa; Nasopharyngeal  Result Value Ref Range Status   MRSA by PCR NEGATIVE NEGATIVE Final    Comment:        The GeneXpert MRSA Assay (FDA approved for NASAL specimens only), is one component of a comprehensive MRSA colonization surveillance program. It is not intended to diagnose MRSA infection nor to guide or monitor treatment for MRSA infections. Performed at [facility], [address], [place], [place] [address]   Culture, blood (single)     Status: None   Collection Time: 04/04/19  2:30 PM   Specimen: BLOOD  Result Value Ref Range Status   Specimen Description   Final    BLOOD LEFT  HAND Performed at [facility], [address] [place], [place] [address]    Special Requests   Final    BOTTLES DRAWN AEROBIC ONLY Blood Culture adequate volume Performed at [facility], [address] [place], [place] [address]    Culture   Final    NO GROWTH 5 DAYS Performed at [facility], [address], [place], [place] [address]    Report Status 04/09/2019 FINAL  Final  Body fluid culture     Status: None (Preliminary result)   Collection Time: 04/12/19  4:18 PM   Specimen: PATH Cytology Pleural fluid  Result Value Ref Range Status   Specimen Description   Final    PLEURAL Performed at [facility], [address], [place], [place] [address]    Special Requests   Final    NONE Performed at [facility], [address] [place], [place] [address]    Gram Stain NO WBC SEEN NO ORGANISMS SEEN   Final   Culture   Final    NO GROWTH < 24 HOURS Performed at [facility], [address], [place], [place] [address]    Report Status PENDING  Incomplete    Radiology Studies: Dg Chest 1 View  Result Date: 04/12/2019 CLINICAL DATA:  Status post thoracentesis EXAM: CHEST  1 VIEW COMPARISON:  04/12/2019, 11:45 a.m. FINDINGS: Significant interval reduction in volume in a now small, layering right pleural effusion with associated atelectasis or consolidation. No significant pneumothorax. The left lung is normally aerated. The heart and  mediastinum are normal. IMPRESSION: Significant interval reduction in volume in a now small, layering right pleural effusion with associated atelectasis or consolidation. No significant pneumothorax. Electronically Signed   By: Lauralyn Primes M.D.   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 EXAM: ULTRASOUND GUIDED DIAGNOSTIC AND THERAPEUTIC RIGHT THORACENTESIS MEDICATIONS: None COMPLICATIONS: None immediate. PROCEDURE: An ultrasound guided thoracentesis was thoroughly discussed with the patient and questions answered. The benefits, risks, alternatives and complications were also discussed. The patient understands and wishes to proceed with the procedure. Written consent was obtained. Ultrasound was performed to localize and mark an adequate pocket of fluid in the right chest. The area was then prepped and draped in the normal sterile fashion. 1% Lidocaine was used for local anesthesia. Under ultrasound guidance a 6 Fr Safe-T-Centesis catheter was introduced. Thoracentesis was performed. The catheter was removed and a dressing applied. FINDINGS: A total of approximately 2 liters of light yellow fluid was removed. Samples were sent to the laboratory as requested by the clinical team. Due to persistent patient coughing only the above amount of fluid was removed today. IMPRESSION: Successful ultrasound guided diagnostic and therapeutic right thoracentesis yielding 2 liters of pleural fluid. Read by: Jeananne Rama, PA-C Electronically Signed   By: Richarda Overlie M.D.   On: 04/12/2019 17:02     Millisa Giarrusso T. Sheza Strickland [facility]  If 7PM-7AM, please contact night-coverage [email or web address] Password [facility] 04/13/2019, 12:29 PM

## 2019-04-14 LAB — COMPREHENSIVE METABOLIC PANEL
ALT: 81 U/L — ABNORMAL HIGH (ref 0–44)
AST: 37 U/L (ref 15–41)
Albumin: 3.5 g/dL (ref 3.5–5.0)
Alkaline Phosphatase: 67 U/L (ref 38–126)
Anion gap: 9 (ref 5–15)
BUN: 16 mg/dL (ref 6–20)
CO2: 31 mmol/L (ref 22–32)
Calcium: 8.3 mg/dL — ABNORMAL LOW (ref 8.9–10.3)
Chloride: 98 mmol/L (ref 98–111)
Creatinine, Ser: 1.21 mg/dL (ref 0.61–1.24)
GFR calc Af Amer: >60 mL/min (ref >60)
GFR calc non Af Amer: >60 mL/min (ref >60)
Glucose, Bld: 117 mg/dL — ABNORMAL HIGH (ref 70–99)
Potassium: 3.5 mmol/L (ref 3.5–5.1)
Sodium: 138 mmol/L (ref 135–145)
Total Bilirubin: 1.6 mg/dL — ABNORMAL HIGH (ref 0.3–1.2)
Total Protein: 5.2 g/dL — ABNORMAL LOW (ref 6.5–8.1)

## 2019-04-14 LAB — CBC
HCT: 26.4 % — ABNORMAL LOW (ref 39.0–52.0)
Hemoglobin: 8.2 g/dL — ABNORMAL LOW (ref 13.0–17.0)
MCH: 29.8 pg (ref 26.0–34.0)
MCHC: 31.1 g/dL (ref 30.0–36.0)
MCV: 96 fL (ref 80.0–100.0)
Platelets: 91 10*3/uL — ABNORMAL LOW (ref 150–400)
RBC: 2.75 MIL/uL — ABNORMAL LOW (ref 4.22–5.81)
RDW: 18.9 % — ABNORMAL HIGH (ref 11.5–15.5)
WBC: 3.2 10*3/uL — ABNORMAL LOW (ref 4.0–10.5)
nRBC: 0 % (ref 0.0–0.2)

## 2019-04-14 LAB — LIPASE, BLOOD: Lipase: 34 U/L (ref 11–51)

## 2019-04-14 LAB — PROTIME-INR
INR: 1.2 (ref 0.8–1.2)
Prothrombin Time: 14.8 seconds (ref 11.4–15.2)

## 2019-04-14 LAB — GLUCOSE, CAPILLARY
Glucose-Capillary: 112 mg/dL — ABNORMAL HIGH (ref 70–99)
Glucose-Capillary: 118 mg/dL — ABNORMAL HIGH (ref 70–99)
Glucose-Capillary: 146 mg/dL — ABNORMAL HIGH (ref 70–99)
Glucose-Capillary: 163 mg/dL — ABNORMAL HIGH (ref 70–99)
Glucose-Capillary: 174 mg/dL — ABNORMAL HIGH (ref 70–99)
Glucose-Capillary: 179 mg/dL — ABNORMAL HIGH (ref 70–99)

## 2019-04-14 LAB — TRIGLYCERIDES, BODY FLUIDS: Triglycerides, Fluid: 14 mg/dL

## 2019-04-14 LAB — PH, BODY FLUID: pH, Body Fluid: 7.7

## 2019-04-14 LAB — MAGNESIUM: Magnesium: 2.1 mg/dL (ref 1.7–2.4)

## 2019-04-14 MED ORDER — FENTANYL CITRATE (PF) 100 MCG/2ML IJ SOLN
25.0000 ug | Freq: Four times a day (QID) | INTRAMUSCULAR | Status: DC | PRN
  Administered 2019-04-14 – 2019-04-15 (×3): 25 ug via INTRAVENOUS
  Filled 2019-04-14 (×3): qty 2

## 2019-04-14 MED ORDER — LIDOCAINE 5 % EX PTCH
2.0000 | MEDICATED_PATCH | CUTANEOUS | Status: DC
  Administered 2019-04-14 – 2019-04-15 (×2): 2 via TRANSDERMAL
  Filled 2019-04-14 (×3): qty 2

## 2019-04-14 NOTE — Progress Notes (Addendum)
Patient ambulated approximately 20 feet with walker, c/o scrotal pain.

## 2019-04-14 NOTE — Progress Notes (Signed)
PROGRESS NOTE  Connor MontaneScott D Vaughn WUJ:[id number] DOB: Dec 21, 1960   PCP: [name], [name], MD  Patient is from: Home.Lives with his wife.  Progressive ambulatory dysfunction.   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  He had relapse of alcohol use.  Reportedly drinks 1/5 of whiskey.  No alcohol or recreational drug use.  Patient was admitted for DKA, SIRS with multiple organ failure without clear source of infection, upper GI bleed, AKI, syncope, alcohol abuse/withdrawal.  PCCM and GI consulted. Empirically started on broad-spectrum antibiotics.  Also started on IV insulin and IV fluid hydration.  Had EGD on 11/13 that revealed LA grade D reflux esophagitis with no bleeding, large (> 5 mm) esophageal varices and portal hypertensive gastropathy.  Per GI, bleeding was presumed to be from gastritis versus varices.  He was treated with octreotide drip and PPI.  He is now on oral Protonix.  Patient with significant fluid overload and scrotal edema..  Started on Lasix and Aldactone without significant improvement despite good urine output.  CXR on 04/12/2019 revealed large right-sided pleural effusion.  He underwent ultrasound-guided thoracocentesis with removal of 2 L of transudative fluid.   Subjective: Patient seen and examined.  Feels much better but still complains of burning pain in the point lower extremities.  He tells me that he feels much better with the lidocaine pain on the right lower extremity and is requesting if he can use another patch for the left lower extremity.  Has no other complaint.  Objective: Vitals:   04/13/19  0636 04/13/19 1507 04/13/19 2202 04/14/19 0648  BP: 126/89 138/77 119/78 119/75  Pulse: 87 100 93 91  Resp: 18 20 19 19   Temp: 97.8 F (36.6 C) 97.7 F (36.5 C) 98.3 F (36.8 C) 98 F (36.7 C)  TempSrc: Oral Oral Oral Oral  SpO2: 100% 96% 97% 98%  Weight:      Height:        Intake/Output Summary (Last 24 hours) at 04/14/2019 1148 Last data filed at 04/14/2019 0653 Gross per 24 hour  Intake 429.54 ml  Output 3950 ml  Net -3520.46 ml   Filed Weights   04/11/19 0830 04/12/19 0500 04/12/19 1107  Weight: 104.9 kg 108.2 kg 103.7 kg    Examination:  General exam: Appears calm and comfortable  Respiratory system: Clear to auscultation. Respiratory effort normal. Cardiovascular system: S1 & S2 heard, RRR. No JVD, murmurs, rubs, gallops or clicks. No pedal edema. Gastrointestinal system: Abdomen is nondistended, soft and nontender. No organomegaly or masses felt.  Moderate to large scrotal edema. normal bowel sounds heard. Central nervous system: Alert and oriented. No focal neurological deficits. Extremities: Symmetric 5 x 5 power. Skin: No rashes, lesions or ulcers.  Psychiatry: Judgement and insight appear normal. Mood & affect appropriate.   Assessment & Plan: Acute on chronic blood loss anemia due to upper GI bleed: hematemesis and melena Thrombocytopenia-likely due to liver disease. Too quick for HIT -Hgb 15 (04/2018)> 7.0 (admit)> 5.6>2u> 8.1> 7.8> 9.4> -Platelet 233 (admit)> 62>> 113> 91 -Anemia panel consistent with severe iron deficiency with iron saturation of 9% -EGD on 11/13-LA grade D reflux esophagitis with no bleeding, large (> 5 mm) EV and PHG.   -Per GI, bleeding  was presumed to be from gastritis versus varices -GI following-off octreotide.  Continue PPI twice daily for 1 month, then daily. -Encourage alcohol cessation-not ready for rehab.  Will provide resources -Received IV Feraheme 510 mg 11/16.   -Continue p.o. iron with good bowel regimen  History of  EtOH/hepatitis C cirrhosis-s/p treatment for hepatitis C Coagulopathy/thrombocytopenia-likely due to alcohol and cirrhosis-improving Elevated liver enzymes/hyperbilirubinemia-likely due to alcohol +/-ischemia-improving Alcohol use disorder.  No signs of withdrawal.  CIWA protocol discontinued. -We will provide resources for alcohol cessation -Continue torsemide to 20 mg daily and Aldactone to 100 mg daily. -Limited abdominal US without significant ascites.  Fluid overload/anasarca/large right pleural effusion/scrotal edema: Likely due to cirrhosis and hypoalbuminemia.  Echo in 2017 with EF of 65 to 70% but indeterminate diastolic dysfunction.  Scrotal edema worse despite good urine output, 3 L / 24 hours.  Scrotal ultrasound reveals edema.  KUB without significant ascites. Creatinine slightly up.  CXR 11/20 with large right pleural effusion.  Thoracocentesis on 11/20 yielded 2 L transudative fluid. -Adjusted diuretics as above. -Monitor fluid status with strict intake and output and a standing weight -Scrotal sling/support-not able to use it -Sodium restriction. -We will give albumin 25 g every 6 hours x3.  Bilateral lower extremity neuropathic pain -Gabapentin 300 mg a.m. and p.m. and 600 mg at bedtime -He feels better with lidocaine patch.  Will order another one for left lower extremity.  Will space out IV fentanyl which he is taking every 2 hours to every 6 hours now.  SIRS with multiple organ failure/injury: COVID-19 and cultures negative.  No signs of SBP.  Resolved. -Empirically treated with broad-spectrum antibiotics (vancomycin, cefepime and Flagyl) 11/12-11/14.  No indication of antibiotics. -Continue monitoring  Cough/dyspnea/wheezing: Never smoker.  No hx of COPD or asthma.  No more symptoms as such.  Likely due to large pleural effusion which has been drained, 2 L on 04/12/2019 -Mucolytic's, antitussives and as needed DuoNeb  Left abdominal wall pain-no splenomegaly or  significant ascites on abdominal ultrasound. resolved  DKA/uncontrolled DM-2 with hyperglycemia: DKA resolved.  A1c 6.1% on 11/12.  Blood sugar control.  Continue 13 units of Lantus along with SSI.  CBG (last 3)  Recent Labs    04/13/19 2356 04/14/19 0350 04/14/19 0733  GLUCAP 157* 118* 112*   Hypokalemia: Resolved.  AKI: Likely due to dehydration.  Resolved.  Monitor daily.  Noncompliance with medications: reportedly lost his insurance and has been able to afford? -Counseled.  Will consult case management close to discharge for medication assistance  Morbid obesity: BMI 35.97 -Encourage lifestyle change to lose weight  Recurrent fall at home/syncope/physical deconditioning/debility: due to alcohol and his comorbidiy as above -Fall precautions -Treat treatable causes as above -PT/OT eval and treatment                DVT prophylaxis: SCD in the setting of GI bleed and thrombocytopenia Code Status: Full code Family Communication: Patient and/or RN.  No family present.  Available if any question. Disposition Plan: Remains inpatient due to significant fluid overload/anasarca/pleural effusion/scrotal edema Consultants: PCCM (signed off), GI  Procedures:  11/13-EGD as above  Microbiology summarized: 11/12-COVID-19 screen negative 11/12-blood cultures negative 11/12-urine culture with insignificant gross  Sch Meds:  Scheduled Meds: . benzonatate  100 mg Oral BID  . dextromethorphan-guaiFENesin  1 tablet Oral BID  . ferrous sulfate  325 mg Oral BID WC  . folic acid  1 mg Oral Daily  . gabapentin  300 mg Oral [id number]  .  gabapentin  600 mg Oral QHS  . insulin aspart  0-9 Units Subcutaneous Q4H  . insulin glargine  13 Units Subcutaneous Q0600  . lidocaine  1 patch Transdermal Q24H  . mouth rinse  15 mL Mouth Rinse BID  . multivitamin with minerals  1 tablet Oral Daily  . neomycin-bacitracin-polymyxin   Topical BID  . pantoprazole  40 mg Oral BID  . polyethylene  glycol  17 g Oral Daily  . spironolactone  100 mg Oral Daily  . thiamine  100 mg Oral Daily   Or  . thiamine  100 mg Intravenous Daily  . torsemide  20 mg Oral Daily  . traZODone  50 mg Oral QHS   Continuous Infusions:  PRN Meds:.chlorpheniramine-HYDROcodone, fentaNYL (SUBLIMAZE) injection, ipratropium-albuterol, labetalol, Muscle Rub, ondansetron (ZOFRAN) IV, senna  Antimicrobials: Anti-infectives (From admission, onward)   Start     Dose/Rate Route Frequency Ordered Stop   04/05/19 1500  vancomycin (VANCOCIN) IVPB 1000 mg/200 mL premix  Status:  Discontinued     1,000 mg 200 mL/hr over 60 Minutes Intravenous Every 24 hours 04/04/19 1452 04/06/19 1223   04/05/19 0100  ceFEPIme (MAXIPIME) 2 g in sodium chloride 0.9 % 100 mL IVPB  Status:  Discontinued     2 g 200 mL/hr over 30 Minutes Intravenous Every 12 hours 04/04/19 1452 04/06/19 1223   04/04/19 2200  metroNIDAZOLE (FLAGYL) IVPB 500 mg  Status:  Discontinued     500 mg 100 mL/hr over 60 Minutes Intravenous Every 8 hours 04/04/19 1332 04/06/19 1223   04/04/19 1500  vancomycin (VANCOCIN) 2,000 mg in sodium chloride 0.9 % 500 mL IVPB     2,000 mg 250 mL/hr over 120 Minutes Intravenous NOW 04/04/19 1452 04/04/19 1707   04/04/19 1145  ceFEPIme (MAXIPIME) 2 g in sodium chloride 0.9 % 100 mL IVPB     2 g 200 mL/hr over 30 Minutes Intravenous  Once 04/04/19 1139 04/04/19 1354   04/04/19 1145  metroNIDAZOLE (FLAGYL) IVPB 500 mg     500 mg 100 mL/hr over 60 Minutes Intravenous  Once 04/04/19 1139 04/04/19 1630   04/04/19 1145  vancomycin (VANCOCIN) IVPB 1000 mg/200 mL premix  Status:  Discontinued     1,000 mg 200 mL/hr over 60 Minutes Intravenous  Once 04/04/19 1139 04/04/19 1452       I have personally reviewed the following labs and images: CBC: Recent Labs  Lab 04/08/19 0807 04/09/19 0615 04/10/19 0525 04/11/19 0557 04/12/19 0550 04/13/19 0611 04/14/19 0553  WBC 5.8 6.0 6.6  --  5.9  --  3.2*  HGB 8.4* 8.5* 8.4* 8.6*  9.3* 9.4* 8.2*  HCT 25.7* 26.9* 26.5* 27.8* 30.3* 30.5* 26.4*  MCV 91.5 92.4 93.3  --  97.4  --  96.0  PLT 66* 77* 92*  --  113*  --  91*   BMP &GFR Recent Labs  Lab 04/08/19 0807 04/09/19 0615 04/10/19 0525 04/11/19 0557 04/12/19 0550 04/13/19 0611 04/14/19 0553  NA 135 137 133* 131* 136 135 138  K 3.2* 3.8 3.9 3.4* 3.8 3.6 3.5  CL 97* 100 97* 94* 97* 96* 98  CO2 30 30 29 29 29  32 31  GLUCOSE 145* 167* 203* 152* 165* 139* 117*  BUN 19 18 16 15 14 16 16   CREATININE 1.10 1.20 1.16 1.16 1.25* 1.33* 1.21  CALCIUM 7.6* 8.1* 7.8* 8.0* 8.4* 8.2* 8.3*  MG 2.1 2.1 2.0 2.3 2.1 1.9 2.1  PHOS 2.6 2.9  --  3.5  --   --   --  Estimated Creatinine Clearance: 76.9 mL/min (by C-G formula based on SCr of 1.21 mg/dL). Liver & Pancreas: Recent Labs  Lab 04/08/19 0807 04/09/19 0615 04/11/19 0557 04/12/19 0550 04/14/19 0553  AST 470* 244* 79* 62* 37  ALT 550* 395* 211* 168* 81*  ALKPHOS 79 84 77 85 67  BILITOT 1.7* 1.1 1.2 1.0 1.6*  PROT 4.9* 4.9* 5.1* 5.2* 5.2*  ALBUMIN 2.9* 3.0* 3.0* 2.9*  3.0* 3.5   Recent Labs  Lab 04/09/19 0615 04/10/19 0525 04/14/19 0553  LIPASE 73* 72* 34   No results for input(s): AMMONIA in the last 168 hours. Diabetic: No results for input(s): HGBA1C in the last 72 hours. Recent Labs  Lab 04/13/19 1701 04/13/19 2204 04/13/19 2356 04/14/19 0350 04/14/19 0733  GLUCAP 154* 208* 157* 118* 112*   Cardiac Enzymes: Recent Labs  Lab 04/09/19 0615  CKTOTAL 97   No results for input(s): PROBNP in the last 8760 hours. Coagulation Profile: Recent Labs  Lab 04/09/19 0615 04/14/19 0553  INR 1.2 1.2   Thyroid Function Tests: No results for input(s): TSH, T4TOTAL, FREET4, T3FREE, THYROIDAB in the last 72 hours. Lipid Profile: No results for input(s): CHOL, HDL, LDLCALC, TRIG, CHOLHDL, LDLDIRECT in the last 72 hours. Anemia Panel: No results for input(s): VITAMINB12, FOLATE, FERRITIN, TIBC, IRON, RETICCTPCT in the last 72 hours. Urine  analysis:    Component Value Date/Time   COLORURINE YELLOW 04/04/2019 0911   APPEARANCEUR CLEAR 04/04/2019 0911   LABSPEC 1.017 04/04/2019 0911   PHURINE 5.0 04/04/2019 0911   GLUCOSEU >=500 (A) 04/04/2019 0911   HGBUR NEGATIVE 04/04/2019 0911   BILIRUBINUR NEGATIVE 04/04/2019 0911   KETONESUR 5 (A) 04/04/2019 0911   PROTEINUR NEGATIVE 04/04/2019 0911   NITRITE NEGATIVE 04/04/2019 0911   LEUKOCYTESUR NEGATIVE 04/04/2019 0911   Sepsis Labs: Invalid input(s): PROCALCITONIN, [place]  Microbiology: Recent Results (from the past 240 hour(s))  Blood Culture (routine x 2)     Status: None   Collection Time: 04/04/19 12:21 PM   Specimen: BLOOD  Result Value Ref Range Status   Specimen Description   Final    BLOOD RIGHT WRIST Performed at [facility] [address], [place], [place] [address]    Special Requests   Final    BOTTLES DRAWN AEROBIC ONLY Blood Culture adequate volume Performed at [facility] [address], [place], [place] [address]    Culture   Final    NO GROWTH 5 DAYS Performed at [facility], [place] [address], [place], [place] [address]    Report Status 04/09/2019 FINAL  Final  Urine culture     Status: Abnormal   Collection Time: 04/04/19 12:21 PM   Specimen: In/Out Cath Urine  Result Value Ref Range Status   Specimen Description   Final    IN/OUT CATH URINE Performed at [facility] [address] [place], [place] [address]    Special Requests   Final    NONE Performed at [facility], [place] [address] [place], [place] [address]    Culture (A)  Final    <10,000 COLONIES/mL INSIGNIFICANT GROWTH Performed at [facility] [address], [place], [place] [address]    Report Status 04/06/2019 FINAL  Final  MRSA PCR Screening     Status: None   Collection Time: 04/04/19  1:36 PM   Specimen: Nasal Mucosa; Nasopharyngeal  Result Value Ref Range Status   MRSA by  PCR NEGATIVE NEGATIVE Final  Comment:        The GeneXpert MRSA Assay (FDA approved for NASAL specimens only), is one component of a comprehensive MRSA colonization surveillance program. It is not intended to diagnose MRSA infection nor to guide or monitor treatment for MRSA infections. Performed at [facility], [address], [place], [place] [address]   Culture, blood (single)     Status: None   Collection Time: 04/04/19  2:30 PM   Specimen: BLOOD  Result Value Ref Range Status   Specimen Description   Final    BLOOD LEFT HAND Performed at [facility], [address], [place], [place] [address]    Special Requests   Final    BOTTLES DRAWN AEROBIC ONLY Blood Culture adequate volume Performed at [facility], [address] [place], [place] [address]    Culture   Final    NO GROWTH 5 DAYS Performed at [facility], [address] [place], [place] [address]    Report Status 04/09/2019 FINAL  Final  Body fluid culture     Status: None (Preliminary result)   Collection Time: 04/12/19  4:18 PM   Specimen: PATH Cytology Pleural fluid  Result Value Ref Range Status   Specimen Description   Final    PLEURAL Performed at [facility], [address] [place], [place] [address]    Special Requests   Final    NONE Performed at [facility], [address], [place], [place] [address]    Gram Stain NO WBC SEEN NO ORGANISMS SEEN   Final   Culture   Final    NO GROWTH 2 DAYS Performed at [facility], [address] [place], [place] [address]    Report Status PENDING  Incomplete    Radiology Studies: No results found.   Hughie Closs, MD [facility]  If 7PM-7AM, please contact night-coverage [email or web address] Password [facility] 04/14/2019, 11:48 AM

## 2019-04-15 LAB — COMPREHENSIVE METABOLIC PANEL
ALT: 70 U/L — ABNORMAL HIGH (ref 0–44)
AST: 37 U/L (ref 15–41)
Albumin: 3.1 g/dL — ABNORMAL LOW (ref 3.5–5.0)
Alkaline Phosphatase: 77 U/L (ref 38–126)
Anion gap: 9 (ref 5–15)
BUN: 15 mg/dL (ref 6–20)
CO2: 30 mmol/L (ref 22–32)
Calcium: 8.3 mg/dL — ABNORMAL LOW (ref 8.9–10.3)
Chloride: 99 mmol/L (ref 98–111)
Creatinine, Ser: 1.24 mg/dL (ref 0.61–1.24)
GFR calc Af Amer: >60 mL/min (ref >60)
GFR calc non Af Amer: >60 mL/min (ref >60)
Glucose, Bld: 155 mg/dL — ABNORMAL HIGH (ref 70–99)
Potassium: 3.7 mmol/L (ref 3.5–5.1)
Sodium: 138 mmol/L (ref 135–145)
Total Bilirubin: 0.7 mg/dL (ref 0.3–1.2)
Total Protein: 5.1 g/dL — ABNORMAL LOW (ref 6.5–8.1)

## 2019-04-15 LAB — CBC WITH DIFFERENTIAL/PLATELET
Abs Immature Granulocytes: 0.02 10*3/uL (ref 0.00–0.07)
Basophils Absolute: 0 10*3/uL (ref 0.0–0.1)
Basophils Relative: 1 %
Eosinophils Absolute: 0.1 10*3/uL (ref 0.0–0.5)
Eosinophils Relative: 3 %
HCT: 29.6 % — ABNORMAL LOW (ref 39.0–52.0)
Hemoglobin: 9.1 g/dL — ABNORMAL LOW (ref 13.0–17.0)
Immature Granulocytes: 1 %
Lymphocytes Relative: 25 %
Lymphs Abs: 0.7 10*3/uL (ref 0.7–4.0)
MCH: 30 pg (ref 26.0–34.0)
MCHC: 30.7 g/dL (ref 30.0–36.0)
MCV: 97.7 fL (ref 80.0–100.0)
Monocytes Absolute: 0.4 10*3/uL (ref 0.1–1.0)
Monocytes Relative: 15 %
Neutro Abs: 1.5 10*3/uL — ABNORMAL LOW (ref 1.7–7.7)
Neutrophils Relative %: 55 %
Platelets: 82 10*3/uL — ABNORMAL LOW (ref 150–400)
RBC: 3.03 MIL/uL — ABNORMAL LOW (ref 4.22–5.81)
RDW: 18.6 % — ABNORMAL HIGH (ref 11.5–15.5)
WBC: 2.8 10*3/uL — ABNORMAL LOW (ref 4.0–10.5)
nRBC: 0 % (ref 0.0–0.2)

## 2019-04-15 LAB — MAGNESIUM: Magnesium: 2.1 mg/dL (ref 1.7–2.4)

## 2019-04-15 LAB — GLUCOSE, CAPILLARY
Glucose-Capillary: 185 mg/dL — ABNORMAL HIGH (ref 70–99)
Glucose-Capillary: 134 mg/dL — ABNORMAL HIGH (ref 70–99)
Glucose-Capillary: 259 mg/dL — ABNORMAL HIGH (ref 70–99)
Glucose-Capillary: 204 mg/dL — ABNORMAL HIGH (ref 70–99)
Glucose-Capillary: 193 mg/dL — ABNORMAL HIGH (ref 70–99)

## 2019-04-15 MED ORDER — TORSEMIDE 20 MG PO TABS
20.0000 mg | ORAL_TABLET | Freq: Two times a day (BID) | ORAL | Status: DC
  Administered 2019-04-15 – 2019-04-16 (×2): 20 mg via ORAL
  Filled 2019-04-15 (×3): qty 1

## 2019-04-15 NOTE — Progress Notes (Signed)
PROGRESS NOTE  Connor Vaughn:[id number] DOB: 03-10-1961   PCP: [name], [name], MD  Patient is from: Home.Lives with his wife.  Progressive ambulatory dysfunction.   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  He had relapse of alcohol use.  Reportedly drinks 1/5 of whiskey.  No alcohol or recreational drug use.  Patient was admitted for DKA, SIRS with multiple organ failure without clear source of infection, upper GI bleed, AKI, syncope, alcohol abuse/withdrawal.  PCCM and GI consulted. Empirically started on broad-spectrum antibiotics.  Also started on IV insulin and IV fluid hydration.  Had EGD on 11/13 that revealed LA grade D reflux esophagitis with no bleeding, large (> 5 mm) esophageal varices and portal hypertensive gastropathy.  Per GI, bleeding was presumed to be from gastritis versus varices.  He was treated with octreotide drip and PPI.  He is now on oral Protonix.  Patient with significant fluid overload and scrotal edema..  Started on Lasix and Aldactone without significant improvement despite good urine output.  CXR on 04/12/2019 revealed large right-sided pleural effusion.  He underwent ultrasound-guided thoracocentesis with removal of 2 L of transudative fluid.   Assessment & Plan: Acute on chronic blood loss anemia due to upper GI bleed: hematemesis and melena Thrombocytopenia-likely due to liver disease. Too quick for HIT -Hgb 15 (04/2018)> 7.0 (admit)> 5.6>2u> 8.1> 7.8> 9.4> 8.2> 9.1 -Platelet 233 (admit)> 62>> 113> 91> 82 -Anemia panel consistent with severe iron deficiency with iron saturation of 9% -EGD  on 11/13-LA grade D reflux esophagitis with no bleeding, large (> 5 mm) EV and PHG.   -Per GI, bleeding was presumed to be from gastritis versus varices -GI following-off octreotide.  Continue PPI twice daily for 1 month, then daily. -Encourage alcohol cessation-not ready for rehab.  Will provide resources -Received IV Feraheme 510 mg 11/16.   -Continue p.o. iron with good bowel regimen  History of EtOH/hepatitis C cirrhosis-s/p treatment for hepatitis C Coagulopathy/thrombocytopenia-likely due to alcohol and cirrhosis-improving Elevated liver enzymes/hyperbilirubinemia-likely due to alcohol +/-ischemia-improving Alcohol use disorder.  No signs of withdrawal.  CIWA protocol discontinued. -We will provide resources for alcohol cessation -We will increase torsemide to 20 mg twice daily and continue Aldactone at 100 mg daily. -Limited abdominal US without significant ascites.  Fluid overload/anasarca/large right pleural effusion/scrotal edema: Likely due to cirrhosis and hypoalbuminemia.  Echo in 2017 with EF of 65 to 70% but indeterminate diastolic dysfunction.  Scrotal edema worse despite good urine output, 3 L / 24 hours.  Scrotal ultrasound reveals edema.  KUB without significant ascites. Creatinine slightly up.  CXR 11/20 with large right pleural effusion.  Thoracocentesis on 11/20 yielded 2 L transudative fluid. -Adjusted diuretics as above. -Monitor fluid status with strict intake and output and a standing weight -Scrotal sling/support-not able to use it -Sodium restriction.  Bilateral lower extremity neuropathic pain -Gabapentin 300 mg a.m. and p.m. and 600 mg at bedtime -He feels better with lidocaine patch which is now in both extremities.  SIRS with multiple organ failure/injury: COVID-19 and cultures negative.  No signs of SBP.  Resolved. -Empirically treated with broad-spectrum antibiotics (vancomycin, cefepime and Flagyl) 11/12-11/14.  No indication of antibiotics. -Continue  monitoring  Cough/dyspnea/wheezing: Never smoker.  No hx  of COPD or asthma.  No more symptoms as such.  Likely due to large pleural effusion which has been drained, 2 L on 04/12/2019 -Mucolytic's, antitussives and as needed DuoNeb  Left abdominal wall pain-no splenomegaly or significant ascites on abdominal ultrasound. resolved  DKA/uncontrolled DM-2 with hyperglycemia: DKA resolved.  A1c 6.1% on 11/12.  Blood sugar fairly controlled.  Continue Lantus at 13 units and SSI.  CBG (last 3)  Recent Labs    04/14/19 2336 04/15/19 0344 04/15/19 0756  GLUCAP 146* 185* 134*   Hypokalemia: Resolved  AKI: Likely due to dehydration.  Resolved.  Monitor daily.  Noncompliance with medications: reportedly lost his insurance and has been able to afford? -Counseled.  Will consult case management close to discharge for medication assistance  Morbid obesity: BMI 35.97 -Encourage lifestyle change to lose weight  Recurrent fall at home/syncope/physical deconditioning/debility: due to alcohol and his comorbidiy as above -Fall precautions Seen by PT OT.  They recommend home health.  Will order 1.  He is not ready to go home today.  Will potentially discharge home tomorrow.                DVT prophylaxis: SCD in the setting of GI bleed and thrombocytopenia Code Status: Full code Family Communication: Patient and/or RN.  No family present.  Available if any question. Disposition Plan: Remains inpatient due to significant fluid overload/anasarca/pleural effusion/scrotal edema.   Consultants: PCCM (signed off), GI   Subjective: Seen and examined.  No complaints other than the scrotal swelling.  This is improving as well but very slowly.  No shortness of breath or abdominal pain.  Objective: Vitals:   04/14/19 1259 04/14/19 1330 04/14/19 2205 04/15/19 0612  BP:  126/72 116/69 129/79  Pulse:  (!) 102 93 88  Resp:  16 19 20   Temp:  98.2 F (36.8 C) 98.6 F (37 C) 97.8 F (36.6 C)   TempSrc:  Oral Oral Oral  SpO2:  95% 96% 98%  Weight: 99.5 kg     Height:        Intake/Output Summary (Last 24 hours) at 04/15/2019 1054 Last data filed at 04/15/2019 0900 Gross per 24 hour  Intake 680 ml  Output 3750 ml  Net -3070 ml   Filed Weights   04/12/19 0500 04/12/19 1107 04/14/19 1259  Weight: 108.2 kg 103.7 kg 99.5 kg    Examination:  General exam: Appears calm and comfortable  Respiratory system: Diminished breath sounds at the bases bilaterally. Respiratory effort normal. Cardiovascular system: S1 & S2 heard, RRR. No JVD, murmurs, rubs, gallops or clicks. No pedal edema. Gastrointestinal system: Abdomen is nondistended, soft and nontender. No organomegaly or masses felt. Normal bowel sounds heard.  Moderate to severe scrotal edema Central nervous system: Alert and oriented. No focal neurological deficits. Extremities: Symmetric 5 x 5 power. Skin: No rashes, lesions or ulcers.  Psychiatry: Judgement and insight appear poor, mood & affect appropriate.   Procedures:  11/13-EGD as above  Microbiology summarized: 11/12-COVID-19 screen negative 11/12-blood cultures negative 11/12-urine culture with insignificant gross  Sch Meds:  Scheduled Meds: . benzonatate  100 mg Oral BID  . dextromethorphan-guaiFENesin  1 tablet Oral BID  . ferrous sulfate  325 mg Oral BID WC  . folic acid  1 mg Oral Daily  . gabapentin  300 mg Oral BH-[id number]  . gabapentin  600 mg Oral QHS  . insulin aspart  0-9 Units Subcutaneous Q4H  . insulin glargine  13 Units Subcutaneous Q0600  . lidocaine  2 patch Transdermal Q24H  . mouth rinse  15 mL Mouth Rinse BID  . multivitamin with minerals  1 tablet Oral Daily  . neomycin-bacitracin-polymyxin   Topical BID  . pantoprazole  40 mg Oral BID  . polyethylene glycol  17 g Oral Daily  . spironolactone  100 mg Oral Daily  . thiamine  100 mg Oral Daily   Or  . thiamine  100 mg Intravenous Daily  . torsemide  20 mg Oral BID  . traZODone  50  mg Oral QHS   Continuous Infusions:  PRN Meds:.chlorpheniramine-HYDROcodone, fentaNYL (SUBLIMAZE) injection, ipratropium-albuterol, labetalol, Muscle Rub, ondansetron (ZOFRAN) IV, senna  Antimicrobials: Anti-infectives (From admission, onward)   Start     Dose/Rate Route Frequency Ordered Stop   04/05/19 1500  vancomycin (VANCOCIN) IVPB 1000 mg/200 mL premix  Status:  Discontinued     1,000 mg 200 mL/hr over 60 Minutes Intravenous Every 24 hours 04/04/19 1452 04/06/19 1223   04/05/19 0100  ceFEPIme (MAXIPIME) 2 g in sodium chloride 0.9 % 100 mL IVPB  Status:  Discontinued     2 g 200 mL/hr over 30 Minutes Intravenous Every 12 hours 04/04/19 1452 04/06/19 1223   04/04/19 2200  metroNIDAZOLE (FLAGYL) IVPB 500 mg  Status:  Discontinued     500 mg 100 mL/hr over 60 Minutes Intravenous Every 8 hours 04/04/19 1332 04/06/19 1223   04/04/19 1500  vancomycin (VANCOCIN) 2,000 mg in sodium chloride 0.9 % 500 mL IVPB     2,000 mg 250 mL/hr over 120 Minutes Intravenous NOW 04/04/19 1452 04/04/19 1707   04/04/19 1145  ceFEPIme (MAXIPIME) 2 g in sodium chloride 0.9 % 100 mL IVPB     2 g 200 mL/hr over 30 Minutes Intravenous  Once 04/04/19 1139 04/04/19 1354   04/04/19 1145  metroNIDAZOLE (FLAGYL) IVPB 500 mg     500 mg 100 mL/hr over 60 Minutes Intravenous  Once 04/04/19 1139 04/04/19 1630   04/04/19 1145  vancomycin (VANCOCIN) IVPB 1000 mg/200 mL premix  Status:  Discontinued     1,000 mg 200 mL/hr over 60 Minutes Intravenous  Once 04/04/19 1139 04/04/19 1452       I have personally reviewed the following labs and images: CBC: Recent Labs  Lab 04/09/19 0615 04/10/19 0525 04/11/19 0557 04/12/19 0550 04/13/19 0611 04/14/19 0553 04/15/19 0516  WBC 6.0 6.6  --  5.9  --  3.2* 2.8*  NEUTROABS  --   --   --   --   --   --  1.5*  HGB 8.5* 8.4* 8.6* 9.3* 9.4* 8.2* 9.1*  HCT 26.9* 26.5* 27.8* 30.3* 30.5* 26.4* 29.6*  MCV 92.4 93.3  --  97.4  --  96.0 97.7  PLT 77* 92*  --  113*  --  91* 82*    BMP &GFR Recent Labs  Lab 04/09/19 0615  04/11/19 0557 04/12/19 0550 04/13/19 0611 04/14/19 0553 04/15/19 0516  NA 137   < > 131* 136 135 138 138  K 3.8   < > 3.4* 3.8 3.6 3.5 3.7  CL 100   < > 94* 97* 96* 98 99  CO2 30   < > 29 29 32 31 30  GLUCOSE 167*   < > 152* 165* 139* 117* 155*  BUN 18   < > 15 14 16 16 15   CREATININE 1.20   < > 1.16 1.25* 1.33* 1.21 1.24  CALCIUM 8.1*   < > 8.0* 8.4* 8.2* 8.3* 8.3*  MG 2.1   < >  2.3 2.1 1.9 2.1 2.1  PHOS 2.9  --  3.5  --   --   --   --    < > = values in this interval not displayed.   Estimated Creatinine Clearance: 73.5 mL/min (by C-G formula based on SCr of 1.24 mg/dL). Liver & Pancreas: Recent Labs  Lab 04/09/19 0615 04/11/19 0557 04/12/19 0550 04/14/19 0553 04/15/19 0516  AST 244* 79* 62* 37 37  ALT 395* 211* 168* 81* 70*  ALKPHOS 84 77 85 67 77  BILITOT 1.1 1.2 1.0 1.6* 0.7  PROT 4.9* 5.1* 5.2* 5.2* 5.1*  ALBUMIN 3.0* 3.0* 2.9*  3.0* 3.5 3.1*   Recent Labs  Lab 04/09/19 0615 04/10/19 0525 04/14/19 0553  LIPASE 73* 72* 34   No results for input(s): AMMONIA in the last 168 hours. Diabetic: No results for input(s): HGBA1C in the last 72 hours. Recent Labs  Lab 04/14/19 1647 04/14/19 2020 04/14/19 2336 04/15/19 0344 04/15/19 0756  GLUCAP 179* 174* 146* 185* 134*   Cardiac Enzymes: Recent Labs  Lab 04/09/19 0615  CKTOTAL 97   No results for input(s): PROBNP in the last 8760 hours. Coagulation Profile: Recent Labs  Lab 04/09/19 0615 04/14/19 0553  INR 1.2 1.2   Thyroid Function Tests: No results for input(s): TSH, T4TOTAL, FREET4, T3FREE, THYROIDAB in the last 72 hours. Lipid Profile: No results for input(s): CHOL, HDL, LDLCALC, TRIG, CHOLHDL, LDLDIRECT in the last 72 hours. Anemia Panel: No results for input(s): VITAMINB12, FOLATE, FERRITIN, TIBC, IRON, RETICCTPCT in the last 72 hours. Urine analysis:    Component Value Date/Time   COLORURINE YELLOW 04/04/2019 0911   APPEARANCEUR CLEAR  04/04/2019 0911   LABSPEC 1.017 04/04/2019 0911   PHURINE 5.0 04/04/2019 0911   GLUCOSEU >=500 (A) 04/04/2019 0911   HGBUR NEGATIVE 04/04/2019 0911   BILIRUBINUR NEGATIVE 04/04/2019 0911   KETONESUR 5 (A) 04/04/2019 0911   PROTEINUR NEGATIVE 04/04/2019 0911   NITRITE NEGATIVE 04/04/2019 0911   LEUKOCYTESUR NEGATIVE 04/04/2019 0911   Sepsis Labs: Invalid input(s): PROCALCITONIN, LACTICIDVEN  Microbiology: Recent Results (from the past 240 hour(s))  Body fluid culture     Status: None (Preliminary result)   Collection Time: 04/12/19  4:18 PM   Specimen: PATH Cytology Pleural fluid  Result Value Ref Range Status   Specimen Description   Final    PLEURAL Performed at [facility], [address], [place], [place] [address]    Special Requests   Final    NONE Performed at [facility], [address] [place], [place] [address]    Gram Stain NO WBC SEEN NO ORGANISMS SEEN   Final   Culture   Final    NO GROWTH 3 DAYS Performed at [facility], [address] [place], [place] [address]    Report Status PENDING  Incomplete    Radiology Studies: No results found.   Hughie Closs, MD [facility]  If 7PM-7AM, please contact night-coverage [email or web address] Password [facility] 04/15/2019, 10:54 AM

## 2019-04-15 NOTE — Progress Notes (Signed)
Subjective: Feeling better.  Still with some SOB, but mild.  Intermittent coughing.  Objective: Vital signs in last 24 hours: Temp:  [97.8 F (36.6 C)-98.6 F (37 C)] 97.8 F (36.6 C) (11/23 0612) Pulse Rate:  [88-102] 88 (11/23 0612) Resp:  [16-20] 20 (11/23 0612) BP: (116-129)/(69-79) 129/79 (11/23 0612) SpO2:  [95 %-98 %] 98 % (11/23 0612) Weight:  [99.5 kg] 99.5 kg (11/22 1259) Last BM Date: 04/12/19  Intake/Output from previous day: 11/22 0701 - 11/23 0700 In: 680 [P.O.:680] Out: 3500 [Urine:3500] Intake/Output this shift: No intake/output data recorded.  General appearance: alert and no distress Resp: Decreased right base breath sounds Cardio: regular rate and rhythm GI: soft, non-tender; bowel sounds normal; no masses,  no organomegaly Extremities: mild edema in the lower extremities  Lab Results: Recent Labs    04/13/19 0611 04/14/19 0553 04/15/19 0516  WBC  --  3.2* 2.8*  HGB 9.4* 8.2* 9.1*  HCT 30.5* 26.4* 29.6*  PLT  --  91* 82*   BMET Recent Labs    04/13/19 0611 04/14/19 0553 04/15/19 0516  NA 135 138 138  K 3.6 3.5 3.7  CL 96* 98 99  CO2 32 31 30  GLUCOSE 139* 117* 155*  BUN 16 16 15   CREATININE 1.33* 1.21 1.24  CALCIUM 8.2* 8.3* 8.3*   LFT Recent Labs    04/15/19 0516  PROT 5.1*  ALBUMIN 3.1*  AST 37  ALT 70*  ALKPHOS 77  BILITOT 0.7   PT/INR Recent Labs    04/14/19 0553  LABPROT 14.8  INR 1.2   Hepatitis Panel No results for input(s): HEPBSAG, HCVAB, HEPAIGM, HEPBIGM in the last 72 hours. C-Diff No results for input(s): CDIFFTOX in the last 72 hours. Fecal Lactopherrin No results for input(s): FECLLACTOFRN in the last 72 hours.  Studies/Results: No results found.  Medications:  Scheduled: . benzonatate  100 mg Oral BID  . dextromethorphan-guaiFENesin  1 tablet Oral BID  . ferrous sulfate  325 mg Oral BID WC  . folic acid  1 mg Oral Daily  . gabapentin  300 mg Oral BH-q8a1p  . gabapentin  600 mg Oral QHS  .  insulin aspart  0-9 Units Subcutaneous Q4H  . insulin glargine  13 Units Subcutaneous Q0600  . lidocaine  2 patch Transdermal Q24H  . mouth rinse  15 mL Mouth Rinse BID  . multivitamin with minerals  1 tablet Oral Daily  . neomycin-bacitracin-polymyxin   Topical BID  . pantoprazole  40 mg Oral BID  . polyethylene glycol  17 g Oral Daily  . spironolactone  100 mg Oral Daily  . thiamine  100 mg Oral Daily   Or  . thiamine  100 mg Intravenous Daily  . torsemide  20 mg Oral Daily  . traZODone  50 mg Oral QHS   Continuous:   Assessment/Plan: 1) Decompensated cirrhosis. 2) Right hydrothorax s/p thoracentesis. 3) Fluid overload.   The patient is diuresing very well.  He has a drop in his weight from 104 kg down to 99 kg yesterday.  There is a net urine output.  Clinically, it is apparent that his swelling is dropping, but there is no significant change with the scrotal edema.  This is the most dependent portion of his body, but it will improve with time.  The thoracentesis also helped with the weight reduction and overall negative fluid balance.  Plan: 1) Continue with Step II diuretics. 2) Continue with the 2 gram sodium diet.  LOS: 11  days   Marqus Macphee D 04/15/2019, 7:47 AM

## 2019-04-15 NOTE — Progress Notes (Signed)
Physical Therapy Treatment Patient Details Name: Connor Vaughn MRN: [id number] DOB: Sep 13, 1960 Today's Date: 04/15/2019    History of Present Illness Pt is 58 yo male with hx of ETOH/hepatitis C cirrhosis, DM2, syncope, and ETOH abuse.  He was admitted with fatigue, falls, hematemesis and melena. Pt admitted with severe sepsis, upper GI bleed, and AKI. He had EGD on 11/13 which revealed esophagitis and esophagel varices and abdominal ascites.    PT Comments    Progressing with mobility. Recommend daily ambulation in the hallway with nursing supervision.    Follow Up Recommendations  Home health PT;Supervision for mobility/OOB     Equipment Recommendations  Rolling walker with 5" wheels    Recommendations for Other Services       Precautions / Restrictions Precautions Precautions: Fall Precaution Comments: scrotal edema Restrictions Weight Bearing Restrictions: No    Mobility  Bed Mobility Overal bed mobility: Modified Independent                Transfers Overall transfer level: Needs assistance Equipment used: Rolling walker (2 wheeled) Transfers: Sit to/from Stand Sit to Stand: Supervision            Ambulation/Gait Ambulation/Gait assistance: Supervision Gait Distance (Feet): 90 Feet Assistive device: Rolling walker (2 wheeled) Gait Pattern/deviations: Wide base of support;Step-through pattern;Decreased stride length     General Gait Details: Dyspnea 2/4. SpO2: 93% on RA. Slow gait speed. Pt tolerated distance well   Stairs             Wheelchair Mobility    Modified Rankin (Stroke Patients Only)       Balance Overall balance assessment: Needs assistance           Standing balance-Leahy Scale: Fair                              Cognition Arousal/Alertness: Awake/alert Behavior During Therapy: WFL for tasks assessed/performed Overall Cognitive Status: Within Functional Limits for tasks assessed                                        Exercises      General Comments        Pertinent Vitals/Pain Pain Assessment: 0-10 Pain Score: 7  Pain Location: groin, LEs Pain Descriptors / Indicators: Discomfort;Sore;Tingling;Burning Pain Intervention(s): Monitored during session;Repositioned    Home Living                      Prior Function            PT Goals (current goals can now be found in the care plan section) Progress towards PT goals: Progressing toward goals    Frequency           PT Plan Current plan remains appropriate    Co-evaluation              AM-PAC PT "6 Clicks" Mobility   Outcome Measure  Help needed turning from your back to your side while in a flat bed without using bedrails?: None Help needed moving from lying on your back to sitting on the side of a flat bed without using bedrails?: None Help needed moving to and from a bed to a chair (including a wheelchair)?: A Little Help needed standing up from a chair using your arms (e.g., wheelchair or bedside chair)?: A  Little Help needed to walk in hospital room?: A Little Help needed climbing 3-5 steps with a railing? : A Little 6 Click Score: 20    End of Session Equipment Utilized During Treatment: Gait belt Activity Tolerance: Patient tolerated treatment well Patient left: in bed;with call bell/phone within reach   PT Visit Diagnosis: Unsteadiness on feet (R26.81);Other abnormalities of gait and mobility (R26.89)     Time: 2263-3354 PT Time Calculation (min) (ACUTE ONLY): 13 min  Charges:  $Gait Training: 8-22 mins                        Weston Anna, PT Acute Rehabilitation Services Pager: [phone] Office: [phone]

## 2019-04-15 NOTE — TOC Transition Note (Addendum)
Transition of Care [facility]) - CM/SW Discharge Note   Patient Details  Name: Connor Vaughn MRN: [id number] Date of Birth: 09/16/1960  [facility] CM/SW Contact:  Shikara Mcauliffe, Marjie Skiff, RN Phone [phone] 04/15/2019, 11:55 AM   Clinical Narrative:    Spoke with pt at bedside for DC planning. Pt states that he has a glucose meter, strips and lancets at home currently. He states that he is able to afford the 70/30 insulin for $25 dollars at [facility]. This CM placed ETOH resources and PCP resources on AVS. Will print [facility] coupons for dc medications when dc medications decided. Pt wife can pick him up after she gets off of work after 5pm. Pt states he does not need a RW or HHPT at home.

## 2019-04-16 LAB — COMPREHENSIVE METABOLIC PANEL
ALT: 58 U/L — ABNORMAL HIGH (ref 0–44)
AST: 33 U/L (ref 15–41)
Albumin: 3.2 g/dL — ABNORMAL LOW (ref 3.5–5.0)
Alkaline Phosphatase: 69 U/L (ref 38–126)
Anion gap: 9 (ref 5–15)
BUN: 13 mg/dL (ref 6–20)
CO2: 29 mmol/L (ref 22–32)
Calcium: 8.4 mg/dL — ABNORMAL LOW (ref 8.9–10.3)
Chloride: 96 mmol/L — ABNORMAL LOW (ref 98–111)
Creatinine, Ser: 1.32 mg/dL — ABNORMAL HIGH (ref 0.61–1.24)
GFR calc Af Amer: >60 mL/min (ref >60)
GFR calc non Af Amer: 60 mL/min — ABNORMAL LOW (ref >60)
Glucose, Bld: 154 mg/dL — ABNORMAL HIGH (ref 70–99)
Potassium: 4 mmol/L (ref 3.5–5.1)
Sodium: 134 mmol/L — ABNORMAL LOW (ref 135–145)
Total Bilirubin: 1.5 mg/dL — ABNORMAL HIGH (ref 0.3–1.2)
Total Protein: 5.3 g/dL — ABNORMAL LOW (ref 6.5–8.1)

## 2019-04-16 LAB — CBC WITH DIFFERENTIAL/PLATELET
Abs Immature Granulocytes: 0.01 10*3/uL (ref 0.00–0.07)
Basophils Absolute: 0 10*3/uL (ref 0.0–0.1)
Basophils Relative: 1 %
Eosinophils Absolute: 0.1 10*3/uL (ref 0.0–0.5)
Eosinophils Relative: 4 %
HCT: 30.3 % — ABNORMAL LOW (ref 39.0–52.0)
Hemoglobin: 9.3 g/dL — ABNORMAL LOW (ref 13.0–17.0)
Immature Granulocytes: 0 %
Lymphocytes Relative: 23 %
Lymphs Abs: 0.7 10*3/uL (ref 0.7–4.0)
MCH: 29.9 pg (ref 26.0–34.0)
MCHC: 30.7 g/dL (ref 30.0–36.0)
MCV: 97.4 fL (ref 80.0–100.0)
Monocytes Absolute: 0.5 10*3/uL (ref 0.1–1.0)
Monocytes Relative: 16 %
Neutro Abs: 1.6 10*3/uL — ABNORMAL LOW (ref 1.7–7.7)
Neutrophils Relative %: 56 %
Platelets: 91 10*3/uL — ABNORMAL LOW (ref 150–400)
RBC: 3.11 MIL/uL — ABNORMAL LOW (ref 4.22–5.81)
RDW: 18.4 % — ABNORMAL HIGH (ref 11.5–15.5)
WBC: 2.9 10*3/uL — ABNORMAL LOW (ref 4.0–10.5)
nRBC: 0 % (ref 0.0–0.2)

## 2019-04-16 LAB — CHOLESTEROL, BODY FLUID: Cholesterol, Fluid: <4 mg/dL

## 2019-04-16 LAB — BODY FLUID CULTURE
Culture: NO GROWTH
Gram Stain: NONE SEEN

## 2019-04-16 LAB — GLUCOSE, CAPILLARY
Glucose-Capillary: 141 mg/dL — ABNORMAL HIGH (ref 70–99)
Glucose-Capillary: 144 mg/dL — ABNORMAL HIGH (ref 70–99)
Glucose-Capillary: 146 mg/dL — ABNORMAL HIGH (ref 70–99)
Glucose-Capillary: 204 mg/dL — ABNORMAL HIGH (ref 70–99)

## 2019-04-16 LAB — MAGNESIUM: Magnesium: 1.9 mg/dL (ref 1.7–2.4)

## 2019-04-16 MED ORDER — OXYCODONE HCL 5 MG PO TABS
5.0000 mg | ORAL_TABLET | ORAL | Status: DC | PRN
  Administered 2019-04-16: 5 mg via ORAL
  Filled 2019-04-16: qty 1

## 2019-04-16 MED ORDER — OMEPRAZOLE 40 MG PO CPDR: DELAYED_RELEASE_CAPSULE | ORAL | 0 refills | Status: DC

## 2019-04-16 MED ORDER — GABAPENTIN 300 MG PO CAPS: 300.0000 mg | ORAL_CAPSULE | Freq: Two times a day (BID) | ORAL | 0 refills | Status: DC

## 2019-04-16 NOTE — Discharge Summary (Addendum)
Physician Discharge Summary  LAEL PILCH UJW:[id number] DOB: 05-14-1961 DOA: 04/04/2019  PCP: Nonnie Done., MD  Admit date: 04/04/2019 Discharge date: 04/16/2019  Admitted From: Home Disposition: Home  Recommendations for Outpatient Follow-up:  1. Follow up with PCP in 1-2 weeks 2. Please obtain BMP/CBC in one week 3. Please follow up on the following pending results:  Home Health: Yes Equipment/Devices: Rolling walker  Discharge Condition: Stable CODE STATUS: Full code Diet recommendation: Low-sodium  Subjective: Seen and examined.  No complaints.  Denies any abdominal pain, nausea.  Tolerating regular diet.  Brief/Interim Summary: 58 year old male with a history of EtOH/hepatitis C cirrhosis s/p treatment for hepatitis C, ongoing alcohol abuse, frequent syncope/fall, DM-2, depression/anxiety, noncompliance and recurrent right pleural effusion presented 04/04/2019 with fatigue, fall (3 in a week), fever, chills, hematemesis, melanotic diarrhea and physical deconditioning.  He had relapse of alcohol use.  Reportedly drinks 1/5 of whiskey.  No alcohol or recreational drug use.  Patient was admitted for DKA, SIRS with multiple organ failure without clear source of infection, upper GI bleed, AKI, syncope, alcohol abuse/withdrawal.  PCCM and GI consulted. Empirically started on broad-spectrum antibiotics.  Also started on IV insulin and IV fluid hydration.  Subsequently he dropped his hemoglobin.  Hemoglobin 7.0 (admit) which dropped to 5.6> for which he received 2 units of PRBC transfusion and his hemoglobin remained stable over 9 for most days after that-Platelet 233 (admit)> 62>> 113> 91> 82. He Had EGD on 11/13 that revealed LA grade D reflux esophagitis with no bleeding, large (> 5 mm) esophageal varices and portal hypertensive gastropathy.  Per GI, bleeding was presumed to be from gastritis versus varices.  He was treated with octreotide drip and PPI and subsequently  octreotide drip was stopped and he was switched to omeprazole 40 mg p.o. twice daily.  He had significant scrotal edema.  He was started back on his Lasix and Aldactone with minimal improvement. CXR on 04/12/2019 revealed large right-sided pleural effusion.  He underwent ultrasound-guided thoracocentesis with removal of 2 L of transudative fluid.  Ultrasound abdomen rule out ascites.  He has remained stable for last couple of days.  He was evaluated by PT OT and they recommended home health and rolling walker which has been ordered for him.  Denies any shortness of breath.  Scrotal edema although moderate to large, improving but very slowly.  He has been advised to keep his scrotum elevated whenever he sleeps and whenever he can.  He has been advised by all the attending physicians on board that scrotal edema will take at least few more weeks to resolve or maybe longer.  He understands all of that.  He was also started on gabapentin p.o. for peripheral neuropathy here.  He was also given lidocaine patches which helped him a lot.  He is being discharged on gabapentin.  He was given torsemide here however he is being discharged home on his home dose of Lasix and Aldactone.  Discharge Diagnoses:  Active Problems:   DKA (diabetic ketoacidoses) (HCC)   Encounter for central line placement   Sepsis with acute renal failure without septic shock (HCC)   Acute blood loss anemia   Pancreatitis   Lactic acidosis   Upper GI bleed    Discharge Instructions  Discharge Instructions    Discharge patient   Complete by: As directed    Discharge disposition: 06-Home-Health Care Svc   Discharge patient date: 04/16/2019     Allergies as of 04/16/2019  Reactions   Latex Rash      Medication List    TAKE these medications   ezetimibe 10 MG tablet Commonly known as: ZETIA Take 10 mg by mouth daily.   furosemide 40 MG tablet Commonly known as: LASIX Take 2 tablets (80 mg total) by mouth daily.    gabapentin 300 MG capsule Commonly known as: Neurontin Take 1 capsule (300 mg total) by mouth 2 (two) times daily.   glipiZIDE 5 MG tablet Commonly known as: GLUCOTROL Take 5 mg by mouth daily before breakfast.   hydrOXYzine 25 MG capsule Commonly known as: VISTARIL Take 25 mg by mouth 4 (four) times daily.   LORazepam 1 MG tablet Commonly known as: ATIVAN Take 1 mg by mouth daily.   methocarbamol 500 MG tablet Commonly known as: ROBAXIN Take 1 tablet (500 mg total) by mouth every 8 (eight) hours as needed for muscle spasms.   mometasone 50 MCG/ACT nasal spray Commonly known as: NASONEX Place 2 sprays into the nose daily as needed (congestion).   omeprazole 40 MG capsule Commonly known as: PriLOSEC Take 40 mg p.o. twice daily for 25 days and then 40 mg p.o. once daily after that.   spironolactone 100 MG tablet Commonly known as: ALDACTONE Take 2 tablets (200 mg total) by mouth daily.            Durable Medical Equipment  (From admission, onward)         Start     Ordered   04/16/19 0747  For home use only DME 4 wheeled rolling walker with seat  Once    Question:  Patient needs a walker to treat with the following condition  Answer:  Balance problem   04/16/19 0747         Follow-up Information    [facility] Follow up.   Why: Call for hospital follow up appointment to establish primary care provider.  Contact information: [address] [place] [address] [phone]       [facility] Follow up.   Why: [phone]       [email or web address] Follow up.   Why: For AA meetings in your area.       [facility] Follow up.   Why: Clinic in [place] that provides medical care with a sliding scale for payment. Contact information: [address] [address]  [phone]       Nonnie Done., MD Follow up in 1 week(s).   Specialty:  Family Medicine Contact information: [address] [place] [address] [phone]          Allergies  Allergen Reactions  . Latex Rash    Consultations: GI and PCCM.   Procedures/Studies: Dg Chest 1 View  Result Date: 04/12/2019 CLINICAL DATA:  Status post thoracentesis EXAM: CHEST  1 VIEW COMPARISON:  04/12/2019, 11:45 a.m. FINDINGS: Significant interval reduction in volume in a now small, layering right pleural effusion with associated atelectasis or consolidation. No significant pneumothorax. The left lung is normally aerated. The heart and mediastinum are normal. IMPRESSION: Significant interval reduction in volume in a now small, layering right pleural effusion with associated atelectasis or consolidation. No significant pneumothorax. Electronically Signed   By: Lauralyn Primes M.D.   On: 04/12/2019 16:55   Dg Chest 2 View  Result Date: 04/12/2019 CLINICAL DATA:  Nausea, vomiting, weakness and cough. EXAM: CHEST - 2 VIEW COMPARISON:  04/04/2019 and  CT chest 12/24/2015. FINDINGS: Trachea is midline. Heart size is grossly stable. New large right pleural effusion with right basilar collapse/consolidation. Left lung is clear. IMPRESSION: New large right pleural effusion with right basilar collapse/consolidation. Electronically Signed   By: Leanna BattlesMelinda  Blietz M.D.   On: 04/12/2019 11:55   Ct Head Wo Contrast  Result Date: 04/04/2019 CLINICAL DATA:  Intermittent syncope.  Recent fall. EXAM: CT HEAD WITHOUT CONTRAST TECHNIQUE: Contiguous axial images were obtained from the base of the skull through the vertex without intravenous contrast. COMPARISON:  05/12/2018 FINDINGS: Brain: The brain shows a normal appearance without evidence of malformation, atrophy, old or acute small or large vessel infarction, mass lesion, hemorrhage, hydrocephalus or extra-axial collection. Vascular: No hyperdense vessel. No evidence of atherosclerotic calcification. Skull: Normal.  No traumatic finding.  No  focal bone lesion. Sinuses/Orbits: Sinuses are clear. Orbits appear normal. Mastoids are clear. Other: None significant IMPRESSION: Normal head CT. Electronically Signed   By: Paulina FusiMark  Shogry M.D.   On: 04/04/2019 18:07   [place] Abdomen Complete  Result Date: 04/06/2019 CLINICAL DATA:  Ascites.  History of alcohol abuse and hepatitis C. EXAM: ABDOMEN ULTRASOUND COMPLETE COMPARISON:  12/26/2015 FINDINGS: Gallbladder: No gallstones or wall thickening visualized. No sonographic Murphy sign noted by sonographer. Common bile duct: Diameter: 3.3 mm Liver: Diffuse increased echogenicity liver with coarse heterogeneous echotexture and irregular contour. The liver appears small. Findings consistent with cirrhosis. No focal hepatic lesions are identified. Portal vein is patent on color Doppler imaging with normal direction of blood flow towards the liver. IVC: Normal caliber. Pancreas: Sonographically unremarkable. Spleen: Normal size.  Maximum length is 12.0 cm. Right Kidney: Length: 11.0 cm. Normal renal cortical thickness and echogenicity without focal lesions or hydronephrosis. Left Kidney: Length: 10.5 cm. Normal renal cortical thickness and echogenicity without focal lesions or hydronephrosis. Abdominal aorta: Normal caliber Other findings: Right-sided pleural effusion is noted. IMPRESSION: 1. Cirrhotic changes involving the liver but no definite hepatic lesions. 2. Normal directional flow in the portal vein. 3. Moderate-sized right pleural effusion is noted. 4. Unremarkable sonographic appearance of the pancreas, spleen and both kidneys. 5. No ascites. Electronically Signed   By: Rudie MeyerP.  Gallerani M.D.   On: 04/06/2019 16:05   [place] Scrotum  Result Date: 04/12/2019 CLINICAL DATA:  Edema EXAM: SCROTAL ULTRASOUND DOPPLER ULTRASOUND OF THE TESTICLES TECHNIQUE: Complete ultrasound examination of the testicles, epididymis, and other scrotal structures was performed. Color and spectral Doppler ultrasound were also utilized to  evaluate blood flow to the testicles. COMPARISON:  None. FINDINGS: Right testicle Measurements: 3.9 x 2.9 x 2.9 cm. No mass or microlithiasis visualized. Left testicle Measurements: 4 x 2.6 x 2.2 cm. No mass or microlithiasis visualized. Right epididymis: Right epididymal cyst measuring 9.5 x 3.1 x 5.3 cm. Left epididymis:  Normal in size and appearance. Hydrocele:  None visualized. Varicocele:  None visualized. Pulsed Doppler interrogation of both testes demonstrates normal low resistance arterial and venous waveforms bilaterally. Other: Bilateral scrotal wall thickening. IMPRESSION: 1. No testicular torsion. 2. Bilateral severe scrotal wall thickening which may reflect cellulitis or secondary to fluid overload. No drainable fluid collection. Electronically Signed   By: Elige KoHetal  Patel   On: 04/12/2019 12:35   [place] Abdomen Limited  Result Date: 04/11/2019 CLINICAL DATA:  Ascites. EXAM: LIMITED ABDOMEN ULTRASOUND FOR ASCITES TECHNIQUE: Limited ultrasound survey for ascites was performed in all four abdominal quadrants. COMPARISON:  April 06, 2019. FINDINGS: Minimal amount of ascites is noted around the liver in the right upper quadrant as well as  in the right lower quadrant. No significant fluid pocket for paracentesis is noted. IMPRESSION: Minimal ascites is noted. Electronically Signed   By: Lupita Raider M.D.   On: 04/11/2019 16:08   Dg Chest Port 1 View  Result Date: 04/04/2019 CLINICAL DATA:  Central line placement. EXAM: PORTABLE CHEST 1 VIEW COMPARISON:  Prior study 04/04/2019. FINDINGS: Right IJ line noted with tip over cavoatrial junction. Mediastinum appears stable. Stable cardiomegaly. No pulmonary venous congestion. Lungs are clear. Low lung volumes. No pleural effusion or pneumothorax. No acute bony abnormality IMPRESSION: Interim placement right IJ line, its tip is at the cavoatrial junction. No evidence of pneumothorax. Electronically Signed   By: Maisie Fus  Register   On: 04/04/2019 16:16    Dg Chest Port 1 View  Result Date: 04/04/2019 CLINICAL DATA:  Sepsis. EXAM: PORTABLE CHEST 1 VIEW COMPARISON:  05/12/2018. FINDINGS: Mediastinum and hilar structures normal. Lungs are clear. No pleural effusion or pneumothorax. Heart size normal. No acute bony abnormality. IMPRESSION: 1.  Cardiomegaly.  No pulmonary venous congestion. 2.  No focal infiltrate. 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 EXAM: ULTRASOUND GUIDED DIAGNOSTIC AND THERAPEUTIC RIGHT THORACENTESIS MEDICATIONS: None COMPLICATIONS: None immediate. PROCEDURE: An ultrasound guided thoracentesis was thoroughly discussed with the patient and questions answered. The benefits, risks, alternatives and complications were also discussed. The patient understands and wishes to proceed with the procedure. Written consent was obtained. Ultrasound was performed to localize and mark an adequate pocket of fluid in the right chest. The area was then prepped and draped in the normal sterile fashion. 1% Lidocaine was used for local anesthesia. Under ultrasound guidance a 6 Fr Safe-T-Centesis catheter was introduced. Thoracentesis was performed. The catheter was removed and a dressing applied. FINDINGS: A total of approximately 2 liters of light yellow fluid was removed. Samples were sent to the laboratory as requested by the clinical team. Due to persistent patient coughing only the above amount of fluid was removed today. IMPRESSION: Successful ultrasound guided diagnostic and therapeutic right thoracentesis yielding 2 liters of pleural fluid. Read by: Jeananne Rama, PA-C Electronically Signed   By: Richarda Overlie M.D.   On: 04/12/2019 17:02     Discharge  Exam: Vitals:   04/15/19 2054 04/16/19 0509  BP: 124/72 134/81  Pulse: 96 86  Resp: 16 16  Temp: 98.3 F (36.8 C) 97.9 F (36.6 C)  SpO2: 96% 94%   Vitals:   04/15/19 0612 04/15/19 1516 04/15/19 2054 04/16/19 0509  BP: 129/79 130/73 124/72 134/81  Pulse: 88 98 96 86  Resp: Temp: 97.8 F (36.6 C) 98.4 F (36.9 C) 98.3 F (36.8 C) 97.9 F (36.6 C)  TempSrc: Oral Oral Oral Oral  SpO2: 98% 94% 96% 94%  Weight:    95.9 kg  Height:        General: Pt is alert, awake, not in acute distress Cardiovascular: RRR, S1/S2 +, no rubs, no gallops Respiratory: CTA bilaterally, no wheezing, no rhonchi Abdominal: Soft, NT, ND, bowel sounds +, moderate to large scrotal edema.  Nontender.  No erythema. Extremities: no edema, no cyanosis    The results of significant diagnostics from this hospitalization (including imaging, microbiology, ancillary and laboratory) are listed below for reference.     Microbiology: Recent  Results (from the past 240 hour(s))  Body fluid culture     Status: None   Collection Time: 04/12/19  4:18 PM   Specimen: PATH Cytology Pleural fluid  Result Value Ref Range Status   Specimen Description   Final    PLEURAL Performed at [facility], [address] [place], [place] [address]    Special Requests   Final    NONE Performed at [facility], [place] [address] [place], [place] [address]    Gram Stain NO WBC SEEN NO ORGANISMS SEEN   Final   Culture   Final    NO GROWTH 3 DAYS Performed at [facility], [place] [address] [place],  [address]    Report Status 04/16/2019 FINAL  Final     Labs: BNP (last 3 results) Recent Labs    04/08/19 1216 04/12/19 0550  BNP 52.2 20.3   Basic Metabolic Panel: Recent Labs  Lab 04/11/19 0557 04/12/19 0550 04/13/19 0611 04/14/19 0553 04/15/19 0516 04/16/19 0558  NA 131* 136 135 138 138 134*  K 3.4* 3.8 3.6 3.5 3.7 4.0  CL 94* 97* 96* 98  99 96*  CO2 29 29 32 31 30 29   GLUCOSE 152* 165* 139* 117* 155* 154*  BUN 15 14 16 16 15 13   CREATININE 1.16 1.25* 1.33* 1.21 1.24 1.32*  CALCIUM 8.0* 8.4* 8.2* 8.3* 8.3* 8.4*  MG 2.3 2.1 1.9 2.1 2.1 1.9  PHOS 3.5  --   --   --   --   --    Liver Function Tests: Recent Labs  Lab 04/11/19 0557 04/12/19 0550 04/14/19 0553 04/15/19 0516 04/16/19 0558  AST 79* 62* 37 37 33  ALT 211* 168* 81* 70* 58*  ALKPHOS 77 85 67 77 69  BILITOT 1.2 1.0 1.6* 0.7 1.5*  PROT 5.1* 5.2* 5.2* 5.1* 5.3*  ALBUMIN 3.0* 2.9*  3.0* 3.5 3.1* 3.2*   Recent Labs  Lab 04/10/19 0525 04/14/19 0553  LIPASE 72* 34   No results for input(s): AMMONIA in the last 168 hours. CBC: Recent Labs  Lab 04/10/19 0525  04/12/19 0550 04/13/19 0611 04/14/19 0553 04/15/19 0516 04/16/19 0558  WBC 6.6  --  5.9  --  3.2* 2.8* 2.9*  NEUTROABS  --   --   --   --   --  1.5* 1.6*  HGB 8.4*   < > 9.3* 9.4* 8.2* 9.1* 9.3*  HCT 26.5*   < > 30.3* 30.5* 26.4* 29.6* 30.3*  MCV 93.3  --  97.4  --  96.0 97.7 97.4  PLT 92*  --  113*  --  91* 82* 91*   < > = values in this interval not displayed.   Cardiac Enzymes: No results for input(s): CKTOTAL, CKMB, CKMBINDEX, TROPONINI in the last 168 hours. BNP: Invalid input(s): POCBNP CBG: Recent Labs  Lab 04/15/19 1556 04/15/19 2059 04/16/19 0024 04/16/19 0505 04/16/19 0758  GLUCAP 204* 193* 146* 144* 141*   D-Dimer No results for input(s): DDIMER in the last 72 hours. Hgb A1c No results for input(s): HGBA1C in the last 72 hours. Lipid Profile No results for input(s): CHOL, HDL, LDLCALC, TRIG, CHOLHDL, LDLDIRECT in the last 72 hours. Thyroid function studies No results for input(s): TSH, T4TOTAL, T3FREE, THYROIDAB in the last 72 hours.  Invalid input(s): FREET3 Anemia work up No results for input(s): VITAMINB12, FOLATE, FERRITIN, TIBC, IRON, RETICCTPCT in the last 72 hours. Urinalysis    Component Value Date/Time  COLORURINE YELLOW 04/04/2019 0911    APPEARANCEUR CLEAR 04/04/2019 0911   LABSPEC 1.017 04/04/2019 0911   PHURINE 5.0 04/04/2019 0911   GLUCOSEU >=500 (A) 04/04/2019 0911   HGBUR NEGATIVE 04/04/2019 0911   BILIRUBINUR NEGATIVE 04/04/2019 0911   KETONESUR 5 (A) 04/04/2019 0911   PROTEINUR NEGATIVE 04/04/2019 0911   NITRITE NEGATIVE 04/04/2019 0911   LEUKOCYTESUR NEGATIVE 04/04/2019 0911   Sepsis Labs Invalid input(s): PROCALCITONIN,  WBC,  LACTICIDVEN Microbiology Recent Results (from the past 240 hour(s))  Body fluid culture     Status: None   Collection Time: 04/12/19  4:18 PM   Specimen: PATH Cytology Pleural fluid  Result Value Ref Range Status   Specimen Description   Final    PLEURAL Performed at [facility], [address] [place], [place] [address]    Special Requests   Final    NONE Performed at [facility], [address], [place], [place] [address]    Gram Stain NO WBC SEEN NO ORGANISMS SEEN   Final   Culture   Final    NO GROWTH 3 DAYS Performed at [facility], [address], [place], [place] [address]    Report Status 04/16/2019 FINAL  Final     Time coordinating discharge: Over 30 minutes  SIGNED:   Hughie Closs, MD  [facility] 04/16/2019, 8:53 AM  If 7PM-7AM, please contact night-coverage [email or web address] Password [email or web address]

## 2019-04-16 NOTE — Discharge Instructions (Signed)
Cirrhosis    Cirrhosis is long-term (chronic) liver injury. The liver is the body's largest internal organ, and it performs many functions. It converts food into energy, removes toxic material from the blood, makes important proteins, and absorbs necessary vitamins from food.  In cirrhosis, healthy liver cells are replaced by scar tissue. This prevents blood from flowing through the liver, making it difficult for the liver to function. Scarring of the liver cannot be reversed, but treatment can prevent it from getting worse.  What are the causes?  Common causes of this condition are hepatitis C and long-term alcohol abuse. Other causes include:  · Nonalcoholic fatty liver disease. This happens when fat is deposited in the liver by causes other than alcohol.  · Hepatitis B infection.  · Autoimmune hepatitis. In this condition, the body's defense system (immune system) mistakenly attacks the liver cells, causing irritation and swelling (inflammation).  · Diseases that cause blockage of ducts inside the liver.  · Inherited liver diseases, such as hemochromatosis. This is one of the most common inherited liver diseases. In this disease, deposits of iron collect in the liver and other organs.  · Reactions to certain long-term medicines, such as amiodarone, a heart medicine.  · Parasitic infections. These include schistosomiasis, which is caused by a flatworm.  · Long-term contact to certain toxins. These toxins include certain organic solvents, such as toluene and chloroform.  What increases the risk?  You are more likely to develop this condition if:  · You have certain types of viral hepatitis.  · You abuse alcohol, especially if you are male.  · You are overweight.  · You share needles.  · You have unprotected sex with someone who has viral hepatitis.  What are the signs or symptoms?  You may not have any signs and symptoms at first. Symptoms may not develop until the damage to your liver starts to get worse.  Early  symptoms may include:  · Weakness and tiredness (fatigue).  · Changes in sleep patterns or having trouble sleeping.  · Itchiness.  · Tenderness in the right-upper part of your abdomen.  · Weight loss and muscle loss.  · Nausea.  · Loss of appetite.  · Appearance of tiny blood vessels under the skin.  Later symptoms may include:  · Fatigue or weakness that is getting worse.  · Yellow skin and eyes (jaundice).  · Buildup of fluid in the abdomen (ascites). You may notice that your clothes are tight around your waist.  · Weight gain.  · Swelling of the feet and ankles (edema).  · Trouble breathing.  · Easy bruising and bleeding.  · Vomiting blood.  · Black or bloody stool.  · Mental confusion.  How is this diagnosed?  Your health care provider may suspect cirrhosis based on your symptoms and medical history, especially if you have other medical conditions or a history of alcohol abuse. Your health care provider will do a physical exam to feel your liver and to check for signs of cirrhosis. He or she may perform other tests, including:  · Blood tests to check:  ? For hepatitis B or C.  ? Kidney function.  ? Liver function.  · Imaging tests such as:  ? MRI or CT scan to look for changes seen in advanced cirrhosis.  ? Ultrasound to see if normal liver tissue is being replaced by scar tissue.  · A procedure in which a long needle is used to take a sample of liver   tissue to be checked in a lab (biopsy). Liver biopsy can confirm the diagnosis of cirrhosis.  How is this treated?  Treatment for this condition depends on how damaged your liver is and what caused the damage. It may include treating the symptoms of cirrhosis, or treating the underlying causes in order to slow the damage. Treatment may include:  · Making lifestyle changes, such as:  ? Eating a healthy diet. You may need to work with your health care provider or a diet and nutrition specialist (dietitian) to develop an eating plan.  ? Restricting salt  intake.  ? Maintaining a healthy weight.  ? Not abusing drugs or alcohol.  · Taking medicines to:  ? Treat liver infections or other infections.  ? Control itching.  ? Reduce fluid buildup.  ? Reduce certain blood toxins.  ? Reduce risk of bleeding from enlarged blood vessels in the stomach or esophagus (varices).  · Liver transplant. In this procedure, a liver from a donor is used to replace your diseased liver. This is done if cirrhosis has caused liver failure.  Other treatments and procedures may be done depending on the problems that you get from cirrhosis. Common problems include liver-related kidney failure (hepatorenal syndrome).  Follow these instructions at home:    · Take medicines only as told by your health care provider. Do not use medicines that are toxic to your liver. Ask your health care provider before taking any new medicines, including over-the-counter medicines.  · Rest as needed.  · Eat a well-balanced diet. Ask your health care provider or dietitian for more information.  · Limit your salt or water intake, if your health care provider asks you to do this.  · Do not drink alcohol. This is especially important if you are taking acetaminophen.  · Keep all follow-up visits as told by your health care provider. This is important.  Contact a health care provider if you:  · Have fatigue or weakness that is getting worse.  · Develop swelling of the hands, feet, legs, or face.  · Have a fever.  · Develop loss of appetite.  · Have nausea or vomiting.  · Develop jaundice.  · Develop easy bruising or bleeding.  Get help right away if you:  · Vomit bright red blood or a material that looks like coffee grounds.  · Have blood in your stools.  · Notice that your stools appear black and tarry.  · Become confused.  · Have chest pain or trouble breathing.  Summary  · Cirrhosis is chronic liver injury. Liver damage cannot be reversed. Common causes are hepatitis C and long-term alcohol abuse.  · Tests used to  diagnose cirrhosis include blood tests, imaging tests, and liver biopsy.  · Treatment for this condition involves treating the underlying cause. Avoid alcohol, drugs, salt, and medicines that may damage your liver.  · Contact your health care provider if you develop ascites, edema, jaundice, fever, nausea or vomiting, easy bruising or bleeding, or worsening fatigue.  This information is not intended to replace advice given to you by your health care provider. Make sure you discuss any questions you have with your health care provider.  Document Released: 05/09/2005 Document Revised: 08/29/2018 Document Reviewed: 03/29/2017  [employer]vier Patient Education © 2020 [employer]

## 2019-04-16 NOTE — Progress Notes (Signed)
Occupational Therapy Treatment Patient Details Name: Connor Vaughn MRN: [id number] DOB: [birth year] Today's Date: 04/16/2019    History of present illness Pt is 58 yo male with hx of ETOH/hepatitis C cirrhosis, DM2, syncope, and ETOH abuse.  He was admitted with fatigue, falls, hematemesis and melena. Pt admitted with severe sepsis, upper GI bleed, and AKI. He had EGD on 11/13 which revealed esophagitis and esophagel varices and abdominal ascites.   OT comments  Pt progressing towards acute OT goals. Awaiting d/c home today. Focus of session was energy conservation education for managing ADLs at home. D/c plan remains appropriate    Follow Up Recommendations  Supervision/Assistance - 24 hour    Equipment Recommendations  None - pt declined 3n1    Recommendations for Other Services      Precautions / Restrictions Precautions Precautions: Fall Precaution Comments: scrotal edema Restrictions Weight Bearing Restrictions: No       Mobility Bed Mobility               General bed mobility comments: pt in supine throughout session. declined OOB at this time pending d/c home later this morning.   Transfers                      Balance                                           ADL either performed or assessed with clinical judgement   ADL                                         General ADL Comments: Reviewed AE, discussed shower transfer. Education on energy conservation strategies and provided handout.     Vision       Perception     Praxis      Cognition Arousal/Alertness: Awake/alert Behavior During Therapy: WFL for tasks assessed/performed Overall Cognitive Status: Within Functional Limits for tasks assessed                                          Exercises     Shoulder Instructions       General Comments      Pertinent Vitals/ Pain       Pain Assessment: Faces Faces Pain Scale:  Hurts little more Pain Location: abdomen Pain Descriptors / Indicators: Aching Pain Intervention(s): Monitored during session;Limited activity within patient's tolerance  Home Living                                          Prior Functioning/Environment              Frequency  Min 2X/week        Progress Toward Goals  OT Goals(current goals can now be found in the care plan section)  Progress towards OT goals: Progressing toward goals  Acute Rehab OT Goals Patient Stated Goal: get better and go home OT Goal Formulation: With patient Time For Goal Achievement: 04/23/19 Potential to Achieve Goals: Good ADL Goals Pt Will Transfer to Toilet: with supervision;ambulating;bedside commode;regular  height toilet Additional ADL Goal #1: pt will perform adl with set up./supervision using ae as needed for energy conservation and to decrease back pain Additional ADL Goal #2: pt will gather clothes with RW with supervision using 02 as needed  Plan Discharge plan remains appropriate    Co-evaluation                 AM-PAC OT "6 Clicks" Daily Activity     Outcome Measure   Help from another person eating meals?: None Help from another person taking care of personal grooming?: A Little Help from another person toileting, which includes using toliet, bedpan, or urinal?: A Little Help from another person bathing (including washing, rinsing, drying)?: A Little Help from another person to put on and taking off regular upper body clothing?: A Little Help from another person to put on and taking off regular lower body clothing?: A Little 6 Click Score: 19    End of Session    OT Visit Diagnosis: Unsteadiness on feet (R26.81);Muscle weakness (generalized) (M62.81)   Activity Tolerance Patient tolerated treatment well   Patient Left in bed;with call bell/phone within reach;with bed alarm set   Nurse Communication          Time: [phone] OT Time  Calculation (min): 13 min  Charges: OT General Charges $OT Visit: 1 Visit OT Treatments $Self Care/Home Management : 8-22 mins  Tyrone Schimke, OT Acute Rehabilitation Services Pager: [phone] Office: [phone]    Hortencia Pilar 04/16/2019, 10:10 AM

## 2019-04-17 LAB — CYTOLOGY - NON PAP

## 2019-12-11 ENCOUNTER — Inpatient Hospital Stay (HOSPITAL_COMMUNITY)
Admission: EM | Admit: 2019-12-11 | Discharge: 2019-12-17 | DRG: 519 | Disposition: A | Discharge status: Home / Self Care | Payer: Medicaid Other | Attending: Internal Medicine | Admitting: Internal Medicine

## 2019-12-11 ENCOUNTER — Other Ambulatory Visit: Payer: Self-pay

## 2019-12-11 ENCOUNTER — Encounter (HOSPITAL_COMMUNITY): Payer: Self-pay | Admitting: Emergency Medicine

## 2019-12-11 DIAGNOSIS — G8929 Other chronic pain: Secondary | ICD-10-CM | POA: Diagnosis present

## 2019-12-11 DIAGNOSIS — Z66 Do not resuscitate: Secondary | ICD-10-CM | POA: Diagnosis present

## 2019-12-11 DIAGNOSIS — W1830XA Fall on same level, unspecified, initial encounter: Secondary | ICD-10-CM | POA: Diagnosis present

## 2019-12-11 DIAGNOSIS — S0031XA Abrasion of nose, initial encounter: Secondary | ICD-10-CM | POA: Diagnosis present

## 2019-12-11 DIAGNOSIS — R292 Abnormal reflex: Secondary | ICD-10-CM | POA: Diagnosis present

## 2019-12-11 DIAGNOSIS — E1122 Type 2 diabetes mellitus with diabetic chronic kidney disease: Secondary | ICD-10-CM | POA: Diagnosis present

## 2019-12-11 DIAGNOSIS — R9431 Abnormal electrocardiogram [ECG] [EKG]: Secondary | ICD-10-CM

## 2019-12-11 DIAGNOSIS — H538 Other visual disturbances: Secondary | ICD-10-CM | POA: Diagnosis present

## 2019-12-11 DIAGNOSIS — Z79899 Other long term (current) drug therapy: Secondary | ICD-10-CM

## 2019-12-11 DIAGNOSIS — M5106 Intervertebral disc disorders with myelopathy, lumbar region: Secondary | ICD-10-CM | POA: Diagnosis present

## 2019-12-11 DIAGNOSIS — Z419 Encounter for procedure for purposes other than remedying health state, unspecified: Secondary | ICD-10-CM

## 2019-12-11 DIAGNOSIS — R55 Syncope and collapse: Secondary | ICD-10-CM | POA: Diagnosis present

## 2019-12-11 DIAGNOSIS — Z7984 Long term (current) use of oral hypoglycemic drugs: Secondary | ICD-10-CM

## 2019-12-11 DIAGNOSIS — Z833 Family history of diabetes mellitus: Secondary | ICD-10-CM

## 2019-12-11 DIAGNOSIS — F419 Anxiety disorder, unspecified: Secondary | ICD-10-CM | POA: Diagnosis present

## 2019-12-11 DIAGNOSIS — I951 Orthostatic hypotension: Secondary | ICD-10-CM | POA: Diagnosis present

## 2019-12-11 DIAGNOSIS — F1010 Alcohol abuse, uncomplicated: Secondary | ICD-10-CM | POA: Diagnosis present

## 2019-12-11 DIAGNOSIS — N182 Chronic kidney disease, stage 2 (mild): Secondary | ICD-10-CM | POA: Diagnosis present

## 2019-12-11 DIAGNOSIS — Z91040 Latex allergy status: Secondary | ICD-10-CM

## 2019-12-11 DIAGNOSIS — I129 Hypertensive chronic kidney disease with stage 1 through stage 4 chronic kidney disease, or unspecified chronic kidney disease: Secondary | ICD-10-CM | POA: Diagnosis present

## 2019-12-11 DIAGNOSIS — Z20822 Contact with and (suspected) exposure to covid-19: Secondary | ICD-10-CM | POA: Diagnosis present

## 2019-12-11 DIAGNOSIS — M5127 Other intervertebral disc displacement, lumbosacral region: Secondary | ICD-10-CM | POA: Diagnosis present

## 2019-12-11 DIAGNOSIS — K7460 Unspecified cirrhosis of liver: Secondary | ICD-10-CM | POA: Diagnosis present

## 2019-12-11 DIAGNOSIS — F329 Major depressive disorder, single episode, unspecified: Secondary | ICD-10-CM | POA: Diagnosis present

## 2019-12-11 DIAGNOSIS — M48062 Spinal stenosis, lumbar region with neurogenic claudication: Principal | ICD-10-CM | POA: Diagnosis present

## 2019-12-11 DIAGNOSIS — D696 Thrombocytopenia, unspecified: Secondary | ICD-10-CM | POA: Diagnosis present

## 2019-12-11 DIAGNOSIS — E860 Dehydration: Secondary | ICD-10-CM | POA: Diagnosis present

## 2019-12-11 DIAGNOSIS — Z87891 Personal history of nicotine dependence: Secondary | ICD-10-CM

## 2019-12-11 DIAGNOSIS — K219 Gastro-esophageal reflux disease without esophagitis: Secondary | ICD-10-CM | POA: Diagnosis present

## 2019-12-11 DIAGNOSIS — B1920 Unspecified viral hepatitis C without hepatic coma: Secondary | ICD-10-CM | POA: Diagnosis present

## 2019-12-11 DIAGNOSIS — N179 Acute kidney failure, unspecified: Secondary | ICD-10-CM | POA: Diagnosis present

## 2019-12-11 DIAGNOSIS — Z7289 Other problems related to lifestyle: Secondary | ICD-10-CM

## 2019-12-11 LAB — CBC WITH DIFFERENTIAL/PLATELET
Abs Immature Granulocytes: 0.04 10*3/uL (ref 0.00–0.07)
Basophils Absolute: 0.1 10*3/uL (ref 0.0–0.1)
Basophils Relative: 1 %
Eosinophils Absolute: 0.1 10*3/uL (ref 0.0–0.5)
Eosinophils Relative: 1 %
HCT: 42.2 % (ref 39.0–52.0)
Hemoglobin: 14 g/dL (ref 13.0–17.0)
Immature Granulocytes: 0 %
Lymphocytes Relative: 22 %
Lymphs Abs: 2.1 10*3/uL (ref 0.7–4.0)
MCH: 28.3 pg (ref 26.0–34.0)
MCHC: 33.2 g/dL (ref 30.0–36.0)
MCV: 85.4 fL (ref 80.0–100.0)
Monocytes Absolute: 1.1 10*3/uL — ABNORMAL HIGH (ref 0.1–1.0)
Monocytes Relative: 11 %
Neutro Abs: 6.3 10*3/uL (ref 1.7–7.7)
Neutrophils Relative %: 65 %
Platelets: 167 10*3/uL (ref 150–400)
RBC: 4.94 MIL/uL (ref 4.22–5.81)
RDW: 16.5 % — ABNORMAL HIGH (ref 11.5–15.5)
WBC: 9.7 10*3/uL (ref 4.0–10.5)
nRBC: 0 % (ref 0.0–0.2)

## 2019-12-11 LAB — COMPREHENSIVE METABOLIC PANEL
ALT: 31 U/L (ref 0–44)
AST: 43 U/L — ABNORMAL HIGH (ref 15–41)
Albumin: 3.6 g/dL (ref 3.5–5.0)
Alkaline Phosphatase: 79 U/L (ref 38–126)
Anion gap: 15 (ref 5–15)
BUN: 21 mg/dL — ABNORMAL HIGH (ref 6–20)
CO2: 26 mmol/L (ref 22–32)
Calcium: 8.3 mg/dL — ABNORMAL LOW (ref 8.9–10.3)
Chloride: 98 mmol/L (ref 98–111)
Creatinine, Ser: 2.89 mg/dL — ABNORMAL HIGH (ref 0.61–1.24)
GFR calc Af Amer: 27 mL/min — ABNORMAL LOW (ref >60)
GFR calc non Af Amer: 23 mL/min — ABNORMAL LOW (ref >60)
Glucose, Bld: 175 mg/dL — ABNORMAL HIGH (ref 70–99)
Potassium: 3.7 mmol/L (ref 3.5–5.1)
Sodium: 139 mmol/L (ref 135–145)
Total Bilirubin: 1.1 mg/dL (ref 0.3–1.2)
Total Protein: 6.4 g/dL — ABNORMAL LOW (ref 6.5–8.1)

## 2019-12-11 LAB — ETHANOL: Alcohol, Ethyl (B): 131 mg/dL — ABNORMAL HIGH (ref <10)

## 2019-12-11 LAB — PROTIME-INR
INR: 1.1 (ref 0.8–1.2)
Prothrombin Time: 13.8 seconds (ref 11.4–15.2)

## 2019-12-11 LAB — MAGNESIUM: Magnesium: 2.2 mg/dL (ref 1.7–2.4)

## 2019-12-11 LAB — LACTIC ACID, PLASMA: Lactic Acid, Venous: 2.9 mmol/L (ref 0.5–1.9)

## 2019-12-11 MED ORDER — SODIUM CHLORIDE 0.9 % IV BOLUS
1000.0000 mL | Freq: Once | INTRAVENOUS | Status: AC
  Administered 2019-12-11: 1000 mL via INTRAVENOUS

## 2019-12-11 NOTE — ED Notes (Signed)
Date and time results received: 12/11/19 2317 (use smartphrase ".now" to insert current time)  Test: lactic acid  Critical Value: 2.9  Name of Provider Notified: Charm Barges, MD   Orders Received? Or Actions Taken?: Orders Received - See Orders for details

## 2019-12-11 NOTE — ED Provider Notes (Signed)
[facility]-EMERGENCY DEPT Provider Note   CSN: [id number] Arrival date & time: 12/11/19  2147     History Chief Complaint  Patient presents with  . Hypotension  . Loss of Consciousness    Connor Vaughn is a 59 y.o. male.  See history of diabetes DKA cirrhosis chronic neck and back pain.  He said his doctor started him on a blood pressure medicine a week or 2 ago.  He has been doing fine for that.  Tonight he said he got out of bed and passed out when trying to walk out of the bedroom.  He thinks he passed out 3 or 4 more times as his wife tried to assist him back into the bed.  Complained of continued neck and back pain chronic.  Blood pressure was low per EMS and was given IV fluids.  Denies any recent illness including no vomiting diarrhea urinary symptoms chest pain shortness of breath.  Admits to one beer.  The history is provided by the patient.  Loss of Consciousness Episode history:  Multiple Most recent episode:  Today Progression:  Unchanged Chronicity:  New Context: standing up   Witnessed: yes   Relieved by:  Bed rest Worsened by:  Posture Ineffective treatments:  None tried Associated symptoms: no chest pain, no difficulty breathing, no fever, no focal sensory loss, no focal weakness, no headaches, no nausea, no rectal bleeding, no shortness of breath, no vomiting and no weakness   Risk factors: no congenital heart disease, no coronary artery disease and no seizures        Past Medical History:  Diagnosis Date  . Cirrhosis (HCC)   . Diabetes mellitus, type II (HCC)   . DKA (diabetic ketoacidoses) (HCC) 04/04/2019  . H/O fracture    nasal x 2    Patient Active Problem List   Diagnosis Date Noted  . DKA (diabetic ketoacidoses) (HCC) 04/04/2019  . Acute blood loss anemia 04/04/2019  . Pancreatitis 04/04/2019  . Lactic acidosis 04/04/2019  . Encounter for central line placement   . Sepsis with acute renal failure without septic shock  (HCC)   . Upper GI bleed   . Recurrent right pleural effusion 01/10/2016  . Alcoholic cirrhosis of liver with ascites (HCC)   . Pleural effusion 12/24/2015  . Pleural effusion, right 12/24/2015  . Alcohol abuse 12/24/2015  . Syncope 12/24/2015    Past Surgical History:  Procedure Laterality Date  . ESOPHAGOGASTRODUODENOSCOPY (EGD) WITH PROPOFOL N/A 04/05/2019   Procedure: ESOPHAGOGASTRODUODENOSCOPY (EGD) WITH PROPOFOL;  Surgeon: Jeani Hawking, MD;  Location: [facility];  Service: Endoscopy;  Laterality: N/A;  . HERNIA REPAIR     Age 60  . thoracocenteis         Family History  Problem Relation Age of Onset  . Diabetes Mother   . Heart failure Mother   . Valvular heart disease Mother   . Diabetes Mellitus II Father   . Heart failure Father   . Dementia Father   . Seizures Neg Hx     Social History   Tobacco Use  . Smoking status: Former Smoker    Types: Cigarettes    Quit date: 2010    Years since quitting: 11.5  . Smokeless tobacco: Never Used  Vaping Use  . Vaping Use: Never used  Substance Use Topics  . Alcohol use: Yes    Comment: "pint every few days"  . Drug use: No    Home Medications Prior to Admission medications  Medication Sig Start Date End Date Taking? Authorizing Provider  ezetimibe (ZETIA) 10 MG tablet Take 10 mg by mouth daily. 05/30/17   [provider]  furosemide (LASIX) 40 MG tablet Take 2 tablets (80 mg total) by mouth daily. Patient not taking: Reported on 04/04/2019 01/11/16   Rolly Salter, MD  gabapentin (NEURONTIN) 300 MG capsule Take 1 capsule (300 mg total) by mouth 2 (two) times daily. 04/16/19 05/16/19  Hughie Closs, MD  glipiZIDE (GLUCOTROL) 5 MG tablet Take 5 mg by mouth daily before breakfast.  05/09/17   [provider]  hydrOXYzine (VISTARIL) 25 MG capsule Take 25 mg by mouth 4 (four) times daily.  05/30/17   [provider]  LORazepam (ATIVAN) 1 MG tablet Take 1 mg by mouth daily.  06/20/17    [provider]  methocarbamol (ROBAXIN) 500 MG tablet Take 1 tablet (500 mg total) by mouth every 8 (eight) hours as needed for muscle spasms. Patient not taking: Reported on 04/04/2019 05/12/18   Terrilee Files, MD  mometasone (NASONEX) 50 MCG/ACT nasal spray Place 2 sprays into the nose daily as needed (congestion).  06/03/17   [provider]  omeprazole (PRILOSEC) 40 MG capsule Take 40 mg p.o. twice daily for 25 days and then 40 mg p.o. once daily after that. 04/16/19   Hughie Closs, MD  spironolactone (ALDACTONE) 100 MG tablet Take 2 tablets (200 mg total) by mouth daily. Patient not taking: Reported on 04/04/2019 01/11/16   Rolly Salter, MD    Allergies    Latex  Review of Systems   Review of Systems  Constitutional: Negative for fever.  HENT: Negative for sore throat.   Eyes: Negative for visual disturbance.  Respiratory: Negative for shortness of breath.   Cardiovascular: Positive for syncope. Negative for chest pain.  Gastrointestinal: Negative for abdominal pain, nausea and vomiting.  Genitourinary: Negative for dysuria.  Musculoskeletal: Positive for back pain and neck pain.  Skin: Negative for rash.  Neurological: Positive for syncope. Negative for focal weakness, speech difficulty, weakness, numbness and headaches.    Physical Exam Updated Vital Signs BP (!) 138/91   Pulse 105   Temp 97.6 F (36.4 C) (Oral)   Resp 15   SpO2 97%   Physical Exam Vitals and nursing note reviewed.  Constitutional:      General: He is not in acute distress.    Appearance: Normal appearance. He is well-developed.  HENT:     Head: Normocephalic.     Comments: Small abrasion over nose Eyes:     Conjunctiva/sclera: Conjunctivae normal.  Cardiovascular:     Rate and Rhythm: Normal rate and regular rhythm.     Heart sounds: No murmur heard.   Pulmonary:     Effort: Pulmonary effort is normal. No respiratory distress.     Breath sounds: Normal breath sounds.   Abdominal:     Palpations: Abdomen is soft.     Tenderness: There is no abdominal tenderness.  Musculoskeletal:        General: No deformity or signs of injury. Normal range of motion.     Cervical back: Neck supple.  Skin:    General: Skin is warm and dry.     Capillary Refill: Capillary refill takes less than 2 seconds.  Neurological:     General: No focal deficit present.     Mental Status: He is alert.     GCS: GCS eye subscore is 4. GCS verbal subscore is 5. GCS motor  subscore is 6.     Sensory: No sensory deficit.     Motor: No weakness.     ED Results / Procedures / Treatments   Labs (all labs ordered are listed, but only abnormal results are displayed) Labs Reviewed  CBC WITH DIFFERENTIAL/PLATELET - Abnormal; Notable for the following components:      Result Value   RDW 16.5 (*)    Monocytes Absolute 1.1 (*)    All other components within normal limits  LACTIC ACID, PLASMA - Abnormal; Notable for the following components:   Lactic Acid, Venous 2.9 (*)    All other components within normal limits  LACTIC ACID, PLASMA - Abnormal; Notable for the following components:   Lactic Acid, Venous 2.2 (*)    All other components within normal limits  ETHANOL - Abnormal; Notable for the following components:   Alcohol, Ethyl (B) 131 (*)    All other components within normal limits  COMPREHENSIVE METABOLIC PANEL - Abnormal; Notable for the following components:   Glucose, Bld 175 (*)    BUN 21 (*)    Creatinine, Ser 2.89 (*)    Calcium 8.3 (*)    Total Protein 6.4 (*)    AST 43 (*)    GFR calc non Af Amer 23 (*)    GFR calc Af Amer 27 (*)    All other components within normal limits  COMPREHENSIVE METABOLIC PANEL - Abnormal; Notable for the following components:   Glucose, Bld 176 (*)    Creatinine, Ser 2.19 (*)    Calcium 8.5 (*)    Total Protein 6.2 (*)    GFR calc non Af Amer 32 (*)    GFR calc Af Amer 37 (*)    All other components within normal limits   PHOSPHORUS - Abnormal; Notable for the following components:   Phosphorus 5.0 (*)    All other components within normal limits  CBC - Abnormal; Notable for the following components:   RDW 16.7 (*)    Platelets 128 (*)    All other components within normal limits  CBG MONITORING, ED - Abnormal; Notable for the following components:   Glucose-Capillary 129 (*)    All other components within normal limits  SARS CORONAVIRUS 2 BY RT PCR (HOSPITAL ORDER, PERFORMED IN [facility])  PROTIME-INR  MAGNESIUM  MAGNESIUM    EKG None  Radiology X-ray chest PA and lateral  Result Date: 12/12/2019 CLINICAL DATA:  Syncope, orthostatic hypotension EXAM: CHEST - 2 VIEW COMPARISON:  12/03/2019 FINDINGS: Frontal and lateral views of the chest demonstrate an unremarkable cardiac silhouette. No airspace disease, effusion, or pneumothorax. No acute bony abnormalities. IMPRESSION: 1. No acute intrathoracic process. Electronically Signed   By: Sharlet SalinaMichael  Brown M.D.   On: 12/12/2019 02:01   MR LUMBAR SPINE WO CONTRAST  Result Date: 12/12/2019 CLINICAL DATA:  Low back pain with progressive neurologic deficit. Fall. EXAM: MRI LUMBAR SPINE WITHOUT CONTRAST TECHNIQUE: Multiplanar, multisequence MR imaging of the lumbar spine was performed. No intravenous contrast was administered. COMPARISON:  Lumbar radiographs 12/03/2019 FINDINGS: Segmentation:  Normal Alignment:  Slight retrolisthesis L1-2, L2-3, L3-4, L4-5, L5-S1 Vertebrae:  Normal bone marrow.  Negative for fracture or mass. Conus medullaris and cauda equina: Conus extends to the L1-2 level. Conus and cauda equina appear normal. Paraspinal and other soft tissues: Negative for paraspinous mass or adenopathy. Disc levels: L1-2: Mild disc degeneration and disc bulging. Left lateral osteophyte formation. Negative for spinal or foraminal stenosis L2-3: Mild disc degeneration  and disc bulging. Mild facet hypertrophy bilaterally. Mild spinal stenosis and mild  subarticular stenosis bilaterally L3-4: Disc degeneration with diffuse disc bulging and endplate spurring. Shallow central disc protrusion. Mild facet hypertrophy bilaterally. Moderate spinal stenosis and moderate subarticular stenosis bilaterally L4-5: Disc degeneration with diffuse disc bulging and endplate spurring. Large central disc protrusion with downgoing disc material causing compression of thecal sac and severe spinal stenosis. Moderate facet and ligamentum flavum hypertrophy contributes to stenosis. There is severe subarticular stenosis bilaterally L5-S1: Disc degeneration with prominent diffuse endplate osteophyte formation. Superimposed central disc protrusion with downgoing disc material. Moderate spinal stenosis and moderate subarticular and foraminal stenosis bilaterally. Bilateral facet hypertrophy. IMPRESSION: Mild spinal stenosis L2-3.  Moderate spinal stenosis L3-4 Severe spinal stenosis L4-5 with severe subarticular stenosis bilaterally. Large central disc protrusion Disc degeneration and prominent spurring L5-S1 with superimposed central disc protrusion. Moderate spinal stenosis and moderate subarticular and foraminal stenosis bilaterally L5-S1. Electronically Signed   By: Marlan Palau M.D.   On: 12/12/2019 11:24    Procedures Procedures (including critical care time)  Medications Ordered in ED Medications  LORazepam (ATIVAN) tablet 1-4 mg (1 mg Oral Given 12/12/19 0831)    Or  LORazepam (ATIVAN) injection 1-4 mg ( Intravenous See Alternative 12/12/19 0831)  thiamine tablet 100 mg (has no administration in time range)    Or  thiamine (B-1) injection 100 mg (has no administration in time range)  folic acid (FOLVITE) tablet 1 mg (has no administration in time range)  multivitamin with minerals tablet 1 tablet (has no administration in time range)  sodium chloride flush (NS) 0.9 % injection 3 mL (3 mLs Intravenous Given 12/12/19 1156)  LORazepam (ATIVAN) injection 0-4 mg (0 mg  Intravenous Not Given 12/12/19 0835)    Followed by  LORazepam (ATIVAN) injection 0-4 mg (has no administration in time range)  labetalol (NORMODYNE) injection 5 mg (has no administration in time range)  gabapentin (NEURONTIN) capsule 100 mg (100 mg Oral Given 12/12/19 1109)  sodium chloride 0.9 % bolus 1,000 mL (0 mLs Intravenous Stopped 12/12/19 0041)  lactated ringers bolus 1,000 mL (0 mLs Intravenous Stopped 12/12/19 0235)  sodium chloride 0.9 % 1,000 mL with thiamine 100 mg, folic acid 1 mg, multivitamins adult 10 mL, potassium chloride 20 mEq, magnesium sulfate 2 g infusion ( Intravenous Stopped 12/12/19 1003)  oxyCODONE (Oxy IR/ROXICODONE) immediate release tablet 5 mg (5 mg Oral Given 12/12/19 0814)    ED Course  I have reviewed the triage vital signs and the nursing notes.  Pertinent labs & imaging results that were available during my care of the patient were reviewed by me and considered in my medical decision making (see chart for details).  Clinical Course as of Dec 11 1205  Wed Dec 11, 2019  2313 Patient EKG showing sinus tachycardia rate of 106, prolonged QT of 505, no acute ST-T changes.  Have added on a magnesium.  Continue cardiac monitoring.  Blood pressure up to 90.  Normal sensorium.  Renal function has doubled.  Will need admission for further management.   [MB]  2315 Discussed with Dr. Robb Matar with [facility] hospitalist service will evaluate the patient for admission.   [MB]    Clinical Course User Index [MB] Terrilee Files, MD   MDM Rules/Calculators/A&P                         This patient complains of syncope and hypotension; this involves an extensive number of treatment Options  and is a complaint that carries with it a high risk of complications and Morbidity. The differential includes overmedication, dehydration, arrhythmia, anemia, metabolic derangement  I ordered, reviewed and interpreted labs, which included CBC with normal white count normal hemoglobin, mildly  low platelets, chemistries with new AKI,, lactic acid elevated-think this is more reflective of hypotension and not sepsis, elevated alcohol at 131, normal coags I ordered medication IV fluids with improvement in his blood pressure Previous records obtained and reviewed in epic, no recent admissions I consulted [facility] hospitalist Dr. Robb Matar and discussed lab and imaging findings  Critical Interventions: None After the interventions stated above, I reevaluated the patient and found patient's blood pressure to be trending up.  He will still need to be admitted for further hydration and management of his AKI and work-up of syncope.  He is agreeable to plan.  Final Clinical Impression(s) / ED Diagnoses Final diagnoses:  Syncope and collapse  AKI (acute kidney injury) (HCC)  Orthostatic hypotension  QT prolongation    Rx / DC Orders ED Discharge Orders    None       Terrilee Files, MD 12/12/19 1512

## 2019-12-11 NOTE — ED Notes (Signed)
Please call patient's wife with updates.

## 2019-12-11 NOTE — ED Triage Notes (Signed)
The patient is from home where he experienced multiple episodes of syncopy. His wifw reported he had 3 syncopal episodes in a 30 min time span. EMS initial sitting BP was 100/60 and standing BP was 60/42. The patient started 10 mg of losartan 7/13. He has felt weak since. EMS started him on 500 ml of fluid. EMS was also present for a syncopal episode.   EMS vitals: 111 CBG 120/78 BP 100-110 HR 97% SPO2 on room air 97.6 Temp

## 2019-12-12 ENCOUNTER — Observation Stay (HOSPITAL_COMMUNITY): Payer: Medicaid Other

## 2019-12-12 DIAGNOSIS — H538 Other visual disturbances: Secondary | ICD-10-CM | POA: Diagnosis present

## 2019-12-12 DIAGNOSIS — K7460 Unspecified cirrhosis of liver: Secondary | ICD-10-CM | POA: Diagnosis present

## 2019-12-12 DIAGNOSIS — R55 Syncope and collapse: Secondary | ICD-10-CM

## 2019-12-12 DIAGNOSIS — N179 Acute kidney failure, unspecified: Secondary | ICD-10-CM | POA: Diagnosis present

## 2019-12-12 DIAGNOSIS — M5127 Other intervertebral disc displacement, lumbosacral region: Secondary | ICD-10-CM | POA: Diagnosis present

## 2019-12-12 DIAGNOSIS — R29898 Other symptoms and signs involving the musculoskeletal system: Secondary | ICD-10-CM | POA: Diagnosis not present

## 2019-12-12 DIAGNOSIS — F329 Major depressive disorder, single episode, unspecified: Secondary | ICD-10-CM | POA: Diagnosis present

## 2019-12-12 DIAGNOSIS — E860 Dehydration: Secondary | ICD-10-CM | POA: Diagnosis present

## 2019-12-12 DIAGNOSIS — R292 Abnormal reflex: Secondary | ICD-10-CM | POA: Diagnosis present

## 2019-12-12 DIAGNOSIS — Z833 Family history of diabetes mellitus: Secondary | ICD-10-CM | POA: Diagnosis not present

## 2019-12-12 DIAGNOSIS — I951 Orthostatic hypotension: Secondary | ICD-10-CM | POA: Diagnosis present

## 2019-12-12 DIAGNOSIS — F10230 Alcohol dependence with withdrawal, uncomplicated: Secondary | ICD-10-CM | POA: Diagnosis not present

## 2019-12-12 DIAGNOSIS — M5106 Intervertebral disc disorders with myelopathy, lumbar region: Secondary | ICD-10-CM | POA: Diagnosis present

## 2019-12-12 DIAGNOSIS — M48062 Spinal stenosis, lumbar region with neurogenic claudication: Secondary | ICD-10-CM | POA: Diagnosis present

## 2019-12-12 DIAGNOSIS — G8929 Other chronic pain: Secondary | ICD-10-CM | POA: Diagnosis present

## 2019-12-12 DIAGNOSIS — B1920 Unspecified viral hepatitis C without hepatic coma: Secondary | ICD-10-CM | POA: Diagnosis present

## 2019-12-12 DIAGNOSIS — Z87891 Personal history of nicotine dependence: Secondary | ICD-10-CM | POA: Diagnosis not present

## 2019-12-12 DIAGNOSIS — K219 Gastro-esophageal reflux disease without esophagitis: Secondary | ICD-10-CM | POA: Diagnosis present

## 2019-12-12 DIAGNOSIS — N182 Chronic kidney disease, stage 2 (mild): Secondary | ICD-10-CM | POA: Diagnosis present

## 2019-12-12 DIAGNOSIS — I129 Hypertensive chronic kidney disease with stage 1 through stage 4 chronic kidney disease, or unspecified chronic kidney disease: Secondary | ICD-10-CM | POA: Diagnosis present

## 2019-12-12 DIAGNOSIS — F419 Anxiety disorder, unspecified: Secondary | ICD-10-CM | POA: Diagnosis present

## 2019-12-12 DIAGNOSIS — Z66 Do not resuscitate: Secondary | ICD-10-CM | POA: Diagnosis present

## 2019-12-12 DIAGNOSIS — F1010 Alcohol abuse, uncomplicated: Secondary | ICD-10-CM | POA: Diagnosis present

## 2019-12-12 DIAGNOSIS — E1122 Type 2 diabetes mellitus with diabetic chronic kidney disease: Secondary | ICD-10-CM | POA: Diagnosis present

## 2019-12-12 DIAGNOSIS — D696 Thrombocytopenia, unspecified: Secondary | ICD-10-CM | POA: Diagnosis present

## 2019-12-12 DIAGNOSIS — S0031XA Abrasion of nose, initial encounter: Secondary | ICD-10-CM | POA: Diagnosis present

## 2019-12-12 DIAGNOSIS — W1830XA Fall on same level, unspecified, initial encounter: Secondary | ICD-10-CM | POA: Diagnosis present

## 2019-12-12 DIAGNOSIS — Z20822 Contact with and (suspected) exposure to covid-19: Secondary | ICD-10-CM | POA: Diagnosis present

## 2019-12-12 LAB — COMPREHENSIVE METABOLIC PANEL
ALT: 29 U/L (ref 0–44)
AST: 40 U/L (ref 15–41)
Albumin: 3.5 g/dL (ref 3.5–5.0)
Alkaline Phosphatase: 79 U/L (ref 38–126)
Anion gap: 12 (ref 5–15)
BUN: 20 mg/dL (ref 6–20)
CO2: 26 mmol/L (ref 22–32)
Calcium: 8.5 mg/dL — ABNORMAL LOW (ref 8.9–10.3)
Chloride: 101 mmol/L (ref 98–111)
Creatinine, Ser: 2.19 mg/dL — ABNORMAL HIGH (ref 0.61–1.24)
GFR calc Af Amer: 37 mL/min — ABNORMAL LOW (ref >60)
GFR calc non Af Amer: 32 mL/min — ABNORMAL LOW (ref >60)
Glucose, Bld: 176 mg/dL — ABNORMAL HIGH (ref 70–99)
Potassium: 3.9 mmol/L (ref 3.5–5.1)
Sodium: 139 mmol/L (ref 135–145)
Total Bilirubin: 0.3 mg/dL (ref 0.3–1.2)
Total Protein: 6.2 g/dL — ABNORMAL LOW (ref 6.5–8.1)

## 2019-12-12 LAB — CBC
HCT: 40.3 % (ref 39.0–52.0)
Hemoglobin: 13.2 g/dL (ref 13.0–17.0)
MCH: 28.1 pg (ref 26.0–34.0)
MCHC: 32.8 g/dL (ref 30.0–36.0)
MCV: 85.9 fL (ref 80.0–100.0)
Platelets: 128 10*3/uL — ABNORMAL LOW (ref 150–400)
RBC: 4.69 MIL/uL (ref 4.22–5.81)
RDW: 16.7 % — ABNORMAL HIGH (ref 11.5–15.5)
WBC: 8.1 10*3/uL (ref 4.0–10.5)
nRBC: 0 % (ref 0.0–0.2)

## 2019-12-12 LAB — MAGNESIUM: Magnesium: 1.9 mg/dL (ref 1.7–2.4)

## 2019-12-12 LAB — SARS CORONAVIRUS 2 BY RT PCR (HOSPITAL ORDER, PERFORMED IN ~~LOC~~ HOSPITAL LAB): SARS Coronavirus 2: NEGATIVE

## 2019-12-12 LAB — ECHOCARDIOGRAM COMPLETE
AV Mean grad: 10 mmHg
AV Peak grad: 16.8 mmHg
Ao pk vel: 2.05 m/s

## 2019-12-12 LAB — PHOSPHORUS: Phosphorus: 5 mg/dL — ABNORMAL HIGH (ref 2.5–4.6)

## 2019-12-12 LAB — CBG MONITORING, ED
Glucose-Capillary: 129 mg/dL — ABNORMAL HIGH (ref 70–99)
Glucose-Capillary: 121 mg/dL — ABNORMAL HIGH (ref 70–99)
Glucose-Capillary: 101 mg/dL — ABNORMAL HIGH (ref 70–99)
Glucose-Capillary: 108 mg/dL — ABNORMAL HIGH (ref 70–99)

## 2019-12-12 LAB — LACTIC ACID, PLASMA: Lactic Acid, Venous: 2.2 mmol/L (ref 0.5–1.9)

## 2019-12-12 LAB — HEMOGLOBIN A1C
Hgb A1c MFr Bld: 6.6 % — ABNORMAL HIGH (ref 4.8–5.6)
Mean Plasma Glucose: 142.72 mg/dL

## 2019-12-12 MED ORDER — INSULIN ASPART 100 UNIT/ML ~~LOC~~ SOLN
0.0000 [IU] | Freq: Three times a day (TID) | SUBCUTANEOUS | Status: DC
  Administered 2019-12-13: 2 [IU] via SUBCUTANEOUS
  Administered 2019-12-13 (×2): 1 [IU] via SUBCUTANEOUS
  Administered 2019-12-14 (×2): 2 [IU] via SUBCUTANEOUS
  Administered 2019-12-14: 1 [IU] via SUBCUTANEOUS
  Administered 2019-12-15 (×2): 2 [IU] via SUBCUTANEOUS
  Administered 2019-12-16 (×2): 1 [IU] via SUBCUTANEOUS
  Administered 2019-12-16: 2 [IU] via SUBCUTANEOUS
  Administered 2019-12-17: 1 [IU] via SUBCUTANEOUS
  Filled 2019-12-12: qty 1
  Filled 2019-12-12: qty 0.09

## 2019-12-12 MED ORDER — THIAMINE HCL 100 MG/ML IJ SOLN: 100.0000 mg | Freq: Every day | INTRAMUSCULAR | Status: DC

## 2019-12-12 MED ORDER — ACETAMINOPHEN 500 MG PO TABS: 500.0000 mg | ORAL_TABLET | Freq: Four times a day (QID) | ORAL | Status: DC | PRN

## 2019-12-12 MED ORDER — ADULT MULTIVITAMIN W/MINERALS CH
1.0000 | ORAL_TABLET | Freq: Every day | ORAL | Status: DC
  Administered 2019-12-13 – 2019-12-17 (×5): 1 via ORAL
  Filled 2019-12-12 (×5): qty 1

## 2019-12-12 MED ORDER — LORAZEPAM 2 MG/ML IJ SOLN: 0.0000 mg | Freq: Four times a day (QID) | INTRAMUSCULAR | Status: DC

## 2019-12-12 MED ORDER — SENNOSIDES-DOCUSATE SODIUM 8.6-50 MG PO TABS: 1.0000 | ORAL_TABLET | Freq: Every evening | ORAL | Status: DC | PRN

## 2019-12-12 MED ORDER — GABAPENTIN 100 MG PO CAPS
100.0000 mg | ORAL_CAPSULE | Freq: Three times a day (TID) | ORAL | Status: DC
  Administered 2019-12-12 – 2019-12-17 (×15): 100 mg via ORAL
  Filled 2019-12-12 (×15): qty 1

## 2019-12-12 MED ORDER — OXYCODONE HCL 5 MG PO TABS
5.0000 mg | ORAL_TABLET | Freq: Once | ORAL | Status: AC
  Administered 2019-12-12: 5 mg via ORAL
  Filled 2019-12-12: qty 1

## 2019-12-12 MED ORDER — THIAMINE HCL 100 MG/ML IJ SOLN
Freq: Once | INTRAVENOUS | Status: AC
  Filled 2019-12-12: qty 1000

## 2019-12-12 MED ORDER — THIAMINE HCL 100 MG PO TABS
100.0000 mg | ORAL_TABLET | Freq: Every day | ORAL | Status: DC
  Administered 2019-12-13 – 2019-12-17 (×5): 100 mg via ORAL
  Filled 2019-12-12 (×5): qty 1

## 2019-12-12 MED ORDER — BUSPIRONE HCL 5 MG PO TABS
10.0000 mg | ORAL_TABLET | Freq: Three times a day (TID) | ORAL | Status: DC
  Administered 2019-12-12 – 2019-12-17 (×13): 10 mg via ORAL
  Filled 2019-12-12 (×4): qty 2
  Filled 2019-12-12: qty 1
  Filled 2019-12-12 (×8): qty 2

## 2019-12-12 MED ORDER — LORAZEPAM 1 MG PO TABS
1.0000 mg | ORAL_TABLET | ORAL | Status: DC | PRN
  Administered 2019-12-12: 1 mg via ORAL
  Administered 2019-12-12: 2 mg via ORAL
  Administered 2019-12-13: 1 mg via ORAL
  Filled 2019-12-12: qty 1
  Filled 2019-12-12: qty 2
  Filled 2019-12-12: qty 1

## 2019-12-12 MED ORDER — LORAZEPAM 2 MG/ML IJ SOLN: 1.0000 mg | INTRAMUSCULAR | Status: DC | PRN

## 2019-12-12 MED ORDER — ENOXAPARIN SODIUM 40 MG/0.4ML ~~LOC~~ SOLN: 40.0000 mg | SUBCUTANEOUS | Status: DC

## 2019-12-12 MED ORDER — ACETAMINOPHEN 650 MG RE SUPP: 325.0000 mg | Freq: Four times a day (QID) | RECTAL | Status: DC | PRN

## 2019-12-12 MED ORDER — ENOXAPARIN SODIUM 40 MG/0.4ML ~~LOC~~ SOLN
40.0000 mg | SUBCUTANEOUS | Status: DC
  Administered 2019-12-12 – 2019-12-13 (×2): 40 mg via SUBCUTANEOUS
  Filled 2019-12-12 (×2): qty 0.4

## 2019-12-12 MED ORDER — LORAZEPAM 2 MG/ML IJ SOLN: 0.0000 mg | Freq: Two times a day (BID) | INTRAMUSCULAR | Status: DC

## 2019-12-12 MED ORDER — HYDROMORPHONE HCL 1 MG/ML IJ SOLN: 0.5000 mg | INTRAMUSCULAR | Status: DC | PRN

## 2019-12-12 MED ORDER — DULOXETINE HCL 60 MG PO CPEP
60.0000 mg | ORAL_CAPSULE | Freq: Every day | ORAL | Status: DC
  Administered 2019-12-12 – 2019-12-17 (×6): 60 mg via ORAL
  Filled 2019-12-12 (×2): qty 1
  Filled 2019-12-12: qty 2
  Filled 2019-12-12 (×3): qty 1

## 2019-12-12 MED ORDER — FOLIC ACID 1 MG PO TABS
1.0000 mg | ORAL_TABLET | Freq: Every day | ORAL | Status: DC
  Administered 2019-12-13 – 2019-12-17 (×5): 1 mg via ORAL
  Filled 2019-12-12 (×5): qty 1

## 2019-12-12 MED ORDER — SODIUM CHLORIDE 0.9% FLUSH
3.0000 mL | Freq: Two times a day (BID) | INTRAVENOUS | Status: DC
  Administered 2019-12-12 – 2019-12-14 (×5): 3 mL via INTRAVENOUS

## 2019-12-12 MED ORDER — LABETALOL HCL 5 MG/ML IV SOLN: 5.0000 mg | INTRAVENOUS | Status: DC | PRN

## 2019-12-12 MED ORDER — LACTATED RINGERS IV BOLUS
1000.0000 mL | Freq: Once | INTRAVENOUS | Status: AC
  Administered 2019-12-12: 1000 mL via INTRAVENOUS

## 2019-12-12 MED ORDER — OXYCODONE HCL 5 MG PO TABS
5.0000 mg | ORAL_TABLET | ORAL | Status: DC | PRN
  Administered 2019-12-12 – 2019-12-14 (×7): 5 mg via ORAL
  Filled 2019-12-12 (×7): qty 1

## 2019-12-12 NOTE — Progress Notes (Signed)
Called for consult at 115pm  Per ERMD patient has LLE weakness, about 3-4/5 worse onset today with a recent history of BLE paresthesias. MRI shows significant stenosis L4-5 w HNP and moderate stenosis L3-4, L5-S1. Denied bowel or bladder changes. I recommended admission to medicine for syncopal workup and transfer to [facility] for neurosurgical eval as he may need a decompression/discectomy for the leg weakness and stenosis but needs medical clearance due to recent multiple syncopal episodes.   Thank you for allowing me to participate in this patient's care.  Please do not hesitate to call with questions or concerns.   Monia Pouch, DO Neurosurgeon [facility] Cell: [phone]

## 2019-12-12 NOTE — ED Notes (Signed)
ECHO at bedside.

## 2019-12-12 NOTE — H&P (Signed)
History and Physical        Hospital Admission Note Date: 12/12/2019  Patient name: Connor Vaughn Medical record number: [id number] Date of birth: 15-Nov-1960 Age: 59 y.o. Gender: male  PCP: Nonnie Done., MD  Patient coming from: Home Lives with: Wife At baseline, ambulates: Independently  Chief Complaint    Chief Complaint  Patient presents with  . Hypotension  . Loss of Consciousness      HPI:   This is a 59 year old male with history of alcohol abuse, HCV s/p treatment, frequent syncopal episodes, type 2 diabetes, depression/anxiety, cirrhosis, chronic back pain, hypertension and was recently started on losartan who presented to the ED on 7/21 from home following multiple episodes of syncope within a 30-minute time span.  EMS was contacted by his wife, BP was 100/60 sitting and decreased to 60/42 upon standing.  He was given 500 cc IV fluids and transported to [facility].  Today, he states he has bilateral upper thigh paresthesias at baseline but since his fall yesterday has had an acutely worsened left lower extremity weakness with paresthesias to his calf and difficulty with ambulation.  Also states that he hit his head upon falling and reported blurry vision today.  Additionally, he states that his syncopal episodes frequently occur when he goes from sitting to standing.  States he only started his losartan about 2 or 3 days ago though prior notes report 1 to 2 weeks ago.  Reports that his last drink was 2 days ago and had 2-3 beers.  Admits that he has had withdrawal symptoms in the past and questionable seizure from withdrawal as well.  ED Course: Afebrile, tachycardic, hypotensive upon arrival and given 1 L LR bolus improved blood pressure.  Lactic acid was elevated and improved after fluids  Vitals:   12/12/19 0943 12/12/19 1115  BP: 139/80 (!) 138/91  Pulse: (!) 114  105  Resp: 14 15  Temp:    SpO2: 100% 97%     Review of Systems:  Review of Systems  Constitutional: Negative for chills and fever.  Eyes: Positive for blurred vision. Negative for double vision and photophobia.  Respiratory: Negative for cough and shortness of breath.   Cardiovascular: Negative for chest pain, palpitations and leg swelling.  Gastrointestinal: Negative for nausea and vomiting.  Genitourinary: Negative for dysuria.  Musculoskeletal: Positive for back pain and falls.  Neurological: Positive for focal weakness and headaches.       Focal weakness of left lower extremity with paresthesias down leg  All other systems reviewed and are negative.   Medical/Social/Family History   Past Medical History: Past Medical History:  Diagnosis Date  . Cirrhosis (HCC)   . Diabetes mellitus, type II (HCC)   . DKA (diabetic ketoacidoses) (HCC) 04/04/2019  . H/O fracture    nasal x 2    Past Surgical History:  Procedure Laterality Date  . ESOPHAGOGASTRODUODENOSCOPY (EGD) WITH PROPOFOL N/A 04/05/2019   Procedure: ESOPHAGOGASTRODUODENOSCOPY (EGD) WITH PROPOFOL;  Surgeon: Jeani Hawking, MD;  Location: [facility] ENDOSCOPY;  Service: Endoscopy;  Laterality: N/A;  . HERNIA REPAIR     Age 59  . thoracocenteis      Medications: Prior to Admission medications   Medication Sig  Start Date End Date Taking? Authorizing Provider  busPIRone (BUSPAR) 10 MG tablet Take 10 mg by mouth 3 (three) times daily. 11/28/19  Yes [provider]  CYMBALTA 60 MG capsule Take 60 mg by mouth daily. 11/28/19  Yes [provider]  furosemide (LASIX) 40 MG tablet Take 2 tablets (80 mg total) by mouth daily. 01/11/16  Yes Rolly Salter, MD  gabapentin (NEURONTIN) 600 MG tablet Take 600 mg by mouth 3 (three) times daily. 11/28/19  Yes [provider]  glipiZIDE (GLUCOTROL) 10 MG tablet Take 10 mg by mouth 2 (two) times daily. 11/28/19  Yes [provider]  hydrOXYzine (ATARAX/VISTARIL)  25 MG tablet Take 25-50 mg by mouth every 6 (six) hours. 11/28/19  Yes [provider]  losartan (COZAAR) 50 MG tablet Take 50 mg by mouth daily. 12/03/19  Yes [provider]  methocarbamol (ROBAXIN) 500 MG tablet Take 1 tablet (500 mg total) by mouth every 8 (eight) hours as needed for muscle spasms. 05/12/18  Yes Terrilee Files, MD  omeprazole (PRILOSEC) 40 MG capsule Take 40 mg p.o. twice daily for 25 days and then 40 mg p.o. once daily after that. Patient not taking: Reported on 12/12/2019 04/16/19   Hughie Closs, MD  spironolactone (ALDACTONE) 100 MG tablet Take 2 tablets (200 mg total) by mouth daily. Patient not taking: Reported on 04/04/2019 01/11/16   Rolly Salter, MD    Allergies:   Allergies  Allergen Reactions  . Latex Rash    Social History:  reports that he quit smoking about 11 years ago. His smoking use included cigarettes. He has never used smokeless tobacco. He reports current alcohol use. He reports that he does not use drugs.  Family History: Family History  Problem Relation Age of Onset  . Diabetes Mother   . Heart failure Mother   . Valvular heart disease Mother   . Diabetes Mellitus II Father   . Heart failure Father   . Dementia Father   . Seizures Neg Hx      Objective   Physical Exam: Blood pressure (!) 138/91, pulse 105, temperature 97.6 F (36.4 C), temperature source Oral, resp. rate 15, SpO2 97 %.  Physical Exam Vitals and nursing note reviewed.  Constitutional:      Appearance: Normal appearance.  HENT:     Head: Normocephalic.     Comments: Superficial abrasion of bridge of nose (see picture) Eyes:     Conjunctiva/sclera: Conjunctivae normal.  Cardiovascular:     Rate and Rhythm: Regular rhythm. Tachycardia present.  Pulmonary:     Effort: Pulmonary effort is normal.     Breath sounds: Normal breath sounds.  Abdominal:     General: Abdomen is flat.     Palpations: Abdomen is soft.  Musculoskeletal:      Comments: Positive left lower extremity straight leg raise test  Skin:    Coloration: Skin is not jaundiced or pale.  Neurological:     Mental Status: He is alert. Mental status is at baseline.  Psychiatric:        Mood and Affect: Mood normal.        Behavior: Behavior normal.       LABS on Admission: I have personally reviewed all the labs and imaging below    Basic Metabolic Panel: Recent Labs  Lab 12/11/19 2223 12/11/19 2223 12/12/19 0246  NA 139  --  139  K 3.7  --  3.9  CL 98  --  101  CO2 26  --  26  GLUCOSE 175*  --  176*  BUN 21*  --  20  CREATININE 2.89*  --  2.19*  CALCIUM 8.3*  --  8.5*  MG 2.2   < > 1.9  PHOS  --   --  5.0*   < > = values in this interval not displayed.   Liver Function Tests: Recent Labs  Lab 12/11/19 2223 12/12/19 0246  AST 43* 40  ALT 31 29  ALKPHOS 79 79  BILITOT 1.1 0.3  PROT 6.4* 6.2*  ALBUMIN 3.6 3.5   No results for input(s): LIPASE, AMYLASE in the last 168 hours. No results for input(s): AMMONIA in the last 168 hours. CBC: Recent Labs  Lab 12/11/19 2222 12/11/19 2222 12/12/19 0246  WBC 9.7  --  8.1  NEUTROABS 6.3  --   --   HGB 14.0  --  13.2  HCT 42.2  --  40.3  MCV 85.4   < > 85.9  PLT 167  --  128*   < > = values in this interval not displayed.   Cardiac Enzymes: No results for input(s): CKTOTAL, CKMB, CKMBINDEX, TROPONINI in the last 168 hours. BNP: Invalid input(s): POCBNP CBG: Recent Labs  Lab 12/12/19 0523  GLUCAP 129*    Radiological Exams on Admission:  X-ray chest PA and lateral  Result Date: 12/12/2019 CLINICAL DATA:  Syncope, orthostatic hypotension EXAM: CHEST - 2 VIEW COMPARISON:  12/03/2019 FINDINGS: Frontal and lateral views of the chest demonstrate an unremarkable cardiac silhouette. No airspace disease, effusion, or pneumothorax. No acute bony abnormalities. IMPRESSION: 1. No acute intrathoracic process. Electronically Signed   By: Sharlet Salina M.D.   On: 12/12/2019 02:01   CT HEAD  WO CONTRAST  Result Date: 12/12/2019 CLINICAL DATA:  Syncope. EXAM: CT HEAD WITHOUT CONTRAST TECHNIQUE: Contiguous axial images were obtained from the base of the skull through the vertex without intravenous contrast. COMPARISON:  04/04/2019 FINDINGS: Brain: There is no evidence for acute hemorrhage, hydrocephalus, mass lesion, or abnormal extra-axial fluid collection. No definite CT evidence for acute infarction. Vascular: No hyperdense vessel or unexpected calcification. Skull: No evidence for fracture. No worrisome lytic or sclerotic lesion. Sinuses/Orbits: The visualized paranasal sinuses and mastoid air cells are clear. Visualized portions of the globes and intraorbital fat are unremarkable. Other: None. IMPRESSION: Unremarkable exam.  No acute intracranial abnormality. Electronically Signed   By: Kennith Center M.D.   On: 12/12/2019 12:29   MR LUMBAR SPINE WO CONTRAST  Result Date: 12/12/2019 CLINICAL DATA:  Low back pain with progressive neurologic deficit. Fall. EXAM: MRI LUMBAR SPINE WITHOUT CONTRAST TECHNIQUE: Multiplanar, multisequence MR imaging of the lumbar spine was performed. No intravenous contrast was administered. COMPARISON:  Lumbar radiographs 12/03/2019 FINDINGS: Segmentation:  Normal Alignment:  Slight retrolisthesis L1-2, L2-3, L3-4, L4-5, L5-S1 Vertebrae:  Normal bone marrow.  Negative for fracture or mass. Conus medullaris and cauda equina: Conus extends to the L1-2 level. Conus and cauda equina appear normal. Paraspinal and other soft tissues: Negative for paraspinous mass or adenopathy. Disc levels: L1-2: Mild disc degeneration and disc bulging. Left lateral osteophyte formation. Negative for spinal or foraminal stenosis L2-3: Mild disc degeneration and disc bulging. Mild facet hypertrophy bilaterally. Mild spinal stenosis and mild subarticular stenosis bilaterally L3-4: Disc degeneration with diffuse disc bulging and endplate spurring. Shallow central disc protrusion. Mild facet  hypertrophy bilaterally. Moderate spinal stenosis and moderate subarticular stenosis bilaterally L4-5: Disc degeneration with diffuse disc bulging and endplate spurring. Large central  disc protrusion with downgoing disc material causing compression of thecal sac and severe spinal stenosis. Moderate facet and ligamentum flavum hypertrophy contributes to stenosis. There is severe subarticular stenosis bilaterally L5-S1: Disc degeneration with prominent diffuse endplate osteophyte formation. Superimposed central disc protrusion with downgoing disc material. Moderate spinal stenosis and moderate subarticular and foraminal stenosis bilaterally. Bilateral facet hypertrophy. IMPRESSION: Mild spinal stenosis L2-3.  Moderate spinal stenosis L3-4 Severe spinal stenosis L4-5 with severe subarticular stenosis bilaterally. Large central disc protrusion Disc degeneration and prominent spurring L5-S1 with superimposed central disc protrusion. Moderate spinal stenosis and moderate subarticular and foraminal stenosis bilaterally L5-S1. Electronically Signed   By: Marlan Palau M.D.   On: 12/12/2019 11:24      EKG: Independently reviewed.  Appears at baseline, prolonged QT   A & P   Active Problems:   Syncope   AKI (acute kidney injury) (HCC)   1. Syncope with history of recurrent syncope, likely orthostatic a. Lactic acid elevated improved with IV fluids b. CT head to rule out brain bleed with blurred vision and headache c. Orthostatic vital signs d. Echo to rule out valvular disease e. Telemetry f. Hold home losartan g. Compression stockings  2. Acute on chronic low back pain with acute left lower extremity weakness, concern for herniated disc a. Occurred post fall b. Has a history of bilateral upper thigh paresthesias so likely some underlying lumbar stenosis c. Positive straight leg raise test on exam d. Renally dosed gabapentin and continue home Cymbalta e. Oxycodone for breakthrough pain f. MRI  lumbar spine g. PT eval  3. Alcohol abuse, concern for mild withdrawal a. Last drink was 2 days ago b. Seems to be going through mild withdrawal currently with tachycardia c. Reports of a history of concerning withdrawal symptoms d. CIWA protocol  4. SIRS secondary to fall and suspected withdrawal a. Tachycardic and with elevated lactic acid which improved with IV fluids b. Continue to treat underlying conditions  5. Type 2 diabetes a. Hold home meds b. Sensitive sliding scale c. HA1C  6. Cirrhosis with history of treated HCV and alcohol abuse a. Holding spironolactone and Lasix given AKI b. Daily weights and I/O  7. AKI a. Likely from hemodynamic changes as well as ARB b. Hold ARB and monitor after getting fluids  8. Prolonged QT a. Hold QT prolonging agents b. Telemetry  9. Hypertension a. Holding home meds due to orthostatic hypotension b. Labetalol as needed  10. Anxiety/depression a. Continue home meds   DVT prophylaxis: Lovenox   Code Status: DNR  Diet: Heart healthy/carb control Family Communication: Admission, patients condition and plan of care including tests being ordered have been discussed with the patient who indicates understanding and agrees with the plan and Code Status.   Disposition Plan: The appropriate patient status for this patient is INPATIENT. Inpatient status is judged to be reasonable and necessary in order to provide the required intensity of service to ensure the patient's safety. The patient's presenting symptoms, physical exam findings, and initial radiographic and laboratory data in the context of their chronic comorbidities is felt to place them at high risk for further clinical deterioration. Furthermore, it is not anticipated that the patient will be medically stable for discharge from the hospital within 2 midnights of admission. The following factors support the patient status of inpatient.   " The patient's presenting symptoms include  syncope and left leg weakness. " The worrisome physical exam findings include positive straight leg raise test and nasal bridge abrasion. "  The initial radiographic and laboratory data are worrisome because of AKI. " The chronic co-morbidities include diabetes, cirrhosis, alcohol use.   * I certify that at the point of admission it is my clinical judgment that the patient will require inpatient hospital care spanning beyond 2 midnights from the point of admission due to high intensity of service, high risk for further deterioration and high frequency of surveillance required.*   Status is: Inpatient  Remains inpatient appropriate because:Ongoing active pain requiring inpatient pain management, Ongoing diagnostic testing needed not appropriate for outpatient work up, IV treatments appropriate due to intensity of illness or inability to take PO and Inpatient level of care appropriate due to severity of illness   Dispo: The patient is from: Home              Anticipated d/c is to: TBD              Anticipated d/c date is: 2 days              Patient currently is not medically stable to d/c.        The medical decision making on this patient was of high complexity and the patient is at high risk for clinical deterioration, therefore this is a level 3  admission.  Consultants  . None  Procedures  . None  Time Spent on Admission: 75 minutes    Jae DireJared E Dayami Taitt, DO [facility] Pager [phone] 12/12/2019, 12:32 PM

## 2019-12-12 NOTE — Progress Notes (Signed)
  Echocardiogram 2D Echocardiogram has been performed.  Celene Skeen 12/12/2019, 1:17 PM

## 2019-12-13 ENCOUNTER — Inpatient Hospital Stay (HOSPITAL_COMMUNITY): Payer: Medicaid Other

## 2019-12-13 DIAGNOSIS — I951 Orthostatic hypotension: Secondary | ICD-10-CM

## 2019-12-13 LAB — GLUCOSE, CAPILLARY
Glucose-Capillary: 141 mg/dL — ABNORMAL HIGH (ref 70–99)
Glucose-Capillary: 139 mg/dL — ABNORMAL HIGH (ref 70–99)
Glucose-Capillary: 167 mg/dL — ABNORMAL HIGH (ref 70–99)
Glucose-Capillary: 144 mg/dL — ABNORMAL HIGH (ref 70–99)
Glucose-Capillary: 134 mg/dL — ABNORMAL HIGH (ref 70–99)

## 2019-12-13 LAB — FOLATE: Folate: 35.1 ng/mL (ref >5.9)

## 2019-12-13 LAB — IRON AND TIBC
Iron: 313 ug/dL — ABNORMAL HIGH (ref 45–182)
Saturation Ratios: 73 % — ABNORMAL HIGH (ref 17.9–39.5)
TIBC: 430 ug/dL (ref 250–450)
UIBC: 117 ug/dL

## 2019-12-13 LAB — VITAMIN B12: Vitamin B-12: 349 pg/mL (ref 180–914)

## 2019-12-13 LAB — RETICULOCYTES
Immature Retic Fract: 8 % (ref 2.3–15.9)
RBC.: 4.71 MIL/uL (ref 4.22–5.81)
Retic Count, Absolute: 43.8 10*3/uL (ref 19.0–186.0)
Retic Ct Pct: 0.9 % (ref 0.4–3.1)

## 2019-12-13 LAB — FERRITIN: Ferritin: 54 ng/mL (ref 24–336)

## 2019-12-13 LAB — TSH: TSH: 3.019 u[IU]/mL (ref 0.350–4.500)

## 2019-12-13 MED ORDER — CHLORDIAZEPOXIDE HCL 5 MG PO CAPS
15.0000 mg | ORAL_CAPSULE | Freq: Three times a day (TID) | ORAL | Status: DC
  Administered 2019-12-13: 15 mg via ORAL
  Filled 2019-12-13: qty 3

## 2019-12-13 MED ORDER — LACTATED RINGERS IV SOLN: INTRAVENOUS | Status: AC

## 2019-12-13 MED ORDER — PANTOPRAZOLE SODIUM 40 MG PO TBEC
40.0000 mg | DELAYED_RELEASE_TABLET | Freq: Every day | ORAL | Status: DC
  Administered 2019-12-13 – 2019-12-17 (×5): 40 mg via ORAL
  Filled 2019-12-13 (×5): qty 1

## 2019-12-13 NOTE — ED Notes (Signed)
Report called  

## 2019-12-13 NOTE — Progress Notes (Signed)
   12/13/19 0929  Orthostatic Lying   BP- Lying (!) 158/98  Pulse- Lying 98  Orthostatic Sitting  BP- Sitting (!) 144/98  Pulse- Sitting 99  Orthostatic Standing at 0 minutes  BP- Standing at 0 minutes (!) 136/111  Pulse- Standing at 0 minutes 103  Orthostatic Standing at 3 minutes  BP- Standing at 3 minutes (!) 145/92  Pulse- Standing at 3 minutes 99

## 2019-12-13 NOTE — Consult Note (Signed)
Neurosurgery Consult  Referring physician: Dr. Dairl Ponder Reason for referral: Spinal stenosis with leg weakness  Chief Complaint: fall with worsening LLE weakness  History of Present Illness: This is a 59 year old male with a history of chronic low back pain for greater than 10 years.  He states he has been dealing with it progressively for a very long time and it has only progressively worsened.  He mainly complains of pain in his buttock and thighs that worsens as he walks.  He does get some relief when he sits or leans over.  He now complains since a fall yesterday (he has had multiple vasovagal episodes recently) that his left leg has pain in the thigh and lateral aspect of his leg and he feels like that leg gives out whenever he walks.  He feels like he has to drag it when he walks now.  He denies any bowel or bladder changes.  He denies any saddle anesthesia.  He also complains of neck pain intermittently for about 2 years as well as intermittent numbness and tingling in his hands for a similar amount of time.  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  Past Medical History:  Diagnosis Date  . Cirrhosis (HCC)   . Diabetes mellitus, type II (HCC)   . DKA (diabetic ketoacidoses) (HCC) 04/04/2019  . H/O fracture    nasal x 2   Past Surgical History:  Procedure Laterality Date  . ESOPHAGOGASTRODUODENOSCOPY (EGD) WITH PROPOFOL N/A 04/05/2019   Procedure: ESOPHAGOGASTRODUODENOSCOPY (EGD) WITH PROPOFOL;  Surgeon: Jeani Hawking, MD;  Location: [facility];  Service: Endoscopy;  Laterality: N/A;  . HERNIA REPAIR     Age 47  . thoracocenteis      Medications: I have reviewed the patient's current medications. Allergies:  Allergies  Allergen Reactions  . Latex Rash    Family History  Problem Relation Age of Onset  . Diabetes Mother   . Heart failure Mother   . Valvular heart disease Mother   . Diabetes Mellitus II Father   . Heart failure Father   . Dementia Father   .  Seizures Neg Hx    Social History:  reports that he quit smoking about 11 years ago. His smoking use included cigarettes. He has never used smokeless tobacco. He reports current alcohol use. He reports that he does not use drugs.  ROS: 14 point review of systems was obtained which all the pertinent positives and negatives are listed in the HPI.  Physical Exam:  Vital signs in last 24 hours: Temp:  [97.6 F (36.4 C)-98.3 F (36.8 C)] 97.6 F (36.4 C) (07/23 0726) Pulse Rate:  [83-109] 88 (07/23 0800) Resp:  [11-19] 12 (07/23 0726) BP: (129-176)/(76-106) 153/98 (07/23 0726) SpO2:  [95 %-100 %] 98 % (07/23 0726) Weight:  [93.4 kg-94 kg] 94 kg (07/23 0444)  Awake alert oriented x3 Pupils equally reactive round to light EOMI Face symmetric Bilateral upper extremities 5 out of 5 strength throughout Right lower extremity 5 out of 5 strength throughout Left lower extremity 4+ out of 5 strength throughout, some pain limited effort Sensory slightly decreased in the left lateral leg and bilateral thighs anteriorly and laterally Positive brisk Hoffmann's bilaterally Bilateral 3/4 patellar reflexes   Impression/Assessment:  1.  Lumbar stenosis, moderate to severe, L3-4, L4-5, L5-S1 2.  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  Hyperreflexia  Plan:  -I reviewed the MRI of the lumbar spine and CT of the brain.  MRI of the lumbar spine reveals moderate to severe stenosis L3-S1, L4-5 is the most severe stenotic level with a superimposed herniated nucleus pulposus in the left lateral recess.  I discussed with him his findings of lumbar stenosis and his complaints of neurogenic claudication for years and recommend surgical decompression for this.  He has been trying to get into see somebody recently and address this longstanding problem.  He would like to address this while he is in the hospital at this time.  -I am obtaining an MRI of the cervical spine as he is  hyperreflexic throughout and has Hoffmann's bilaterally, he also complains of some neck pain and intermittent numbness tingling in his fingertips.  Once MRI of the cervical spine is complete I will discuss surgical options and further planning for him.   Thank you for allowing me to participate in this patient's care.  Please do not hesitate to call with questions or concerns.   Monia Pouch, DO Neurosurgeon [facility] Cell: [phone]

## 2019-12-13 NOTE — Progress Notes (Signed)
Patient admitted for Syncope episodes.  Arrived into the unit from [facility] at 0100 am.  Patient is alert and oriented, skin assessment done, abrasion noted on the upper part of the nose. Respirations even and unlabored. Vital signs stable on arrival.  Patient was made comfortable and oriented to room and unit. No sign of distress noted

## 2019-12-13 NOTE — ED Notes (Signed)
[facility] called for tx

## 2019-12-13 NOTE — Evaluation (Signed)
Physical Therapy Evaluation Patient Details Name: Connor Vaughn MRN: [id number] DOB: 1960/09/30 Today's Date: 12/13/2019   History of Present Illness  Pt is a 59 y.o. male admitted 12/11/19 with falls, worsening BLE paresthesias and LLE weakness. Workup revealed moderate to severe lumbar stenosis L3-S1, herniated nucleus pulposus L4-5. Also with hyperreflexia, awaiting cervical spine MRI. Potential for surgical intervention. PMH includes DM2, cirrhosis, HTN, chronic back pain, Hep C.    Clinical Impression  Pt presents with an overall decrease in functional mobility secondary to above. PTA, pt mod indep with intermittent use of SPC, limited community ambulator secondary to progressive BLE pain and weakness, lives with wife. Today, pt able to initiate gait training without DME; no pre-syncopal symptoms noted; demonstrates BLE instability, pain and weakness (LLE > RLE), as well as impaired balance strategies. Pt hopeful for surgical intervention; initiated educ re: sternal precautions, potential brace wear and DME needs. Pt would benefit from continued acute PT services to maximize functional mobility and independence prior to d/c home.     Follow Up Recommendations No PT follow up;Supervision - Intermittent (pending progression)    Equipment Recommendations   (TBD - may benefit from RW, [facility])    Recommendations for Other Services       Precautions / Restrictions Precautions Precautions: Fall;Back Precaution Comments: Back for comfort - potential for surgical intervention - initiated education about precautions, pt with good recall Restrictions Weight Bearing Restrictions: No      Mobility  Bed Mobility               General bed mobility comments: Received sitting in recliner  Transfers Overall transfer level: Needs assistance Equipment used: None Transfers: Sit to/from Stand Sit to Stand: Supervision            Ambulation/Gait Ambulation/Gait assistance: Min  guard Gait Distance (Feet): 200 Feet Assistive device: None Gait Pattern/deviations: Step-through pattern;Decreased stride length;Antalgic Gait velocity: Decreased   General Gait Details: Slow, mildly unsteady gait without DME, close min guard for balance, pt intermittently reaching to rail support in hallway; L knee instability noted  Stairs            Wheelchair Mobility    Modified Rankin (Stroke Patients Only)       Balance Overall balance assessment: Needs assistance   Sitting balance-Leahy Scale: Good       Standing balance-Leahy Scale: Fair                               Pertinent Vitals/Pain Pain Assessment: Faces Faces Pain Scale: Hurts little more Pain Location: LLE>RLE, lower back (L>R) Pain Descriptors / Indicators: Discomfort;Shooting;Guarding;Tingling Pain Intervention(s): Monitored during session    Home Living Family/patient expects to be discharged to:: Private residence Living Arrangements: Spouse/significant other Available Help at Discharge: Family;Friend(s);Available PRN/intermittently Type of Home: Mobile home Home Access: Stairs to enter Entrance Stairs-Rails: Right Entrance Stairs-Number of Steps: 2 Home Layout: One level Home Equipment: Cane - single point Additional Comments: Wife works full-time. Could have daughter available for assist if needed upon return home    Prior Function Level of Independence: Independent with assistive device(s)         Comments: Was independent, but most recently using SPC due to back/leg pain and weakness. Reports short community distances, but typically stays at home de to pain/weakness     Hand Dominance        Extremity/Trunk Assessment   Upper Extremity Assessment Upper Extremity  Assessment: Overall WFL for tasks assessed (functionally >3/5 throughout, not formally assessed; no reports of numbness/tingling this session)    Lower Extremity Assessment Lower Extremity  Assessment: RLE deficits/detail;LLE deficits/detail RLE Deficits / Details: R hip flex 4/5, knee flex 4/5, knee ext 4/5, ankle DF 5/5, ankle PF 4/5; reports "pins & needles" sensation from hips to knees, diminished sensation in lower leg as well but not as severe per pt report; sensation not formally tested RLE Sensation: decreased light touch LLE Deficits / Details: L hip flex 3/5, knee flex 3/5, knee ext 3/5, ankle DF 5/5, ankle PF 4/5; reports "pins & needles" sensation from hips to knees, diminished sensation in lower leg as well but not as severe per pt report; sensation not formally tested LLE Sensation: decreased light touch       Communication   Communication: No difficulties  Cognition Arousal/Alertness: Awake/alert Behavior During Therapy: WFL for tasks assessed/performed Overall Cognitive Status: Within Functional Limits for tasks assessed                                        General Comments General comments (skin integrity, edema, etc.): Initiated education on post-op back precautions, brace wear and potential DME needs if pt to have sx intervention    Exercises Other Exercises Other Exercises: Seated therex - hip flex, LAQ, ankle pumps   Assessment/Plan    PT Assessment Patient needs continued PT services  PT Problem List Decreased strength;Decreased activity tolerance;Decreased balance;Decreased mobility;Decreased knowledge of use of DME;Decreased knowledge of precautions;Impaired sensation;Cardiopulmonary status limiting activity       PT Treatment Interventions DME instruction;Gait training;Stair training;Functional mobility training;Therapeutic activities;Therapeutic exercise;Balance training;Patient/family education;Neuromuscular re-education    PT Goals (Current goals can be found in the Care Plan section)  Acute Rehab PT Goals Patient Stated Goal: Interested in surgical intervention PT Goal Formulation: With patient Time For Goal Achievement:  12/27/19 Potential to Achieve Goals: Good    Frequency Min 3X/week   Barriers to discharge        Co-evaluation               AM-PAC PT "6 Clicks" Mobility  Outcome Measure Help needed turning from your back to your side while in a flat bed without using bedrails?: A Little Help needed moving from lying on your back to sitting on the side of a flat bed without using bedrails?: A Little Help needed moving to and from a bed to a chair (including a wheelchair)?: A Little Help needed standing up from a chair using your arms (e.g., wheelchair or bedside chair)?: A Little Help needed to walk in hospital room?: A Little Help needed climbing 3-5 steps with a railing? : A Little 6 Click Score: 18    End of Session Equipment Utilized During Treatment: Gait belt Activity Tolerance: Patient tolerated treatment well Patient left: in chair;with call bell/phone within reach;with chair alarm set Nurse Communication: Mobility status PT Visit Diagnosis: Other abnormalities of gait and mobility (R26.89);Other symptoms and signs involving the nervous system (R29.898)    Time: 4854-6270 PT Time Calculation (min) (ACUTE ONLY): 25 min   Charges:   PT Evaluation $PT Eval Low Complexity: 1 Low PT Treatments $Gait Training: 8-22 mins       Ina Homes, PT, DPT Acute Rehabilitation Services  Pager [phone] Office [phone]  Malachy Chamber 12/13/2019, 1:24 PM

## 2019-12-13 NOTE — Progress Notes (Addendum)
PROGRESS NOTE                                                                                                                                                                                                             Patient Demographics:    Connor Vaughn, is a 59 y.o. male, DOB - Oct 21, 1960, XLK:[id number]  Admit date - 12/11/2019   Admitting Physician [name], MD  Outpatient Primary MD for the patient is [name], [name], MD  LOS - 1  Chief Complaint  Patient presents with  . Hypotension  . Loss of Consciousness       Brief Narrative - This is a 59 year old male with history of alcohol abuse, HCV s/p treatment, frequent syncopal episodes, type 2 diabetes, depression/anxiety, cirrhosis, chronic back pain, hypertension and was recently started on losartan who presented to the ED on 7/21 from home following multiple episodes of syncope within a 30-minute time span   Subjective:    Chou Busler today has, No headache, No chest pain, No abdominal pain - No Nausea, No new weakness tingling or numbness, No Cough - SOB.  He does have to have chronic low back pain.   Assessment  & Plan :     1.  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  Does have mild left leg weakness, currently supportive care, NS following, also getting MRI C-spine as he has some tingling and numbness in the upper arms along with TSH and B12 levels, PT OT for now.  Walker if he goes home.  IV fluids for hydration monitor blood pressure and orthostatics.  2.  Loss of consciousness.  This was after mechanical fall when he hit his head on the floor.  No seizure-like activity, no preceding symptoms, Head CT nonacute, no headache or focal deficits.  PT OT and monitor.  3.  Back pain.  Chronic.  Supportive care and as in #1 above.  4.  On CKD  2.  Baseline creatinine around 1.3, likely due to dehydration from home dose diuretics.  Offending medications held, hydrate and monitor.  NS now following.  5.  Alcohol use.  Patient had alcohol levels upon presentation however on detailed interview he says he only drinks once or twice every 2 weeks.  Will monitor.  If he withdraws then clearly he is not stating the truth and will treat appropriately.  6.  Dehydration and hypotension.  Hydrate with IV fluids monitor orthostatics, TED stockings. Stable TTE.  7. DM type II.  Sliding scale.  Lab Results  Component Value Date   HGBA1C 6.6 (H) 12/12/2019   CBG (last 3)  Recent Labs    12/12/19 2211 12/13/19 0442 12/13/19 0724  GLUCAP 108* 141* 139*     Condition - Extremely Guarded  Family Communication  : Called wife [name] on listed cell phone number [phone] no response switched off, called work  [phone] on 12/13/2019 at 10 AM again no response.  Code Status : Full Code  Consults  : NS   Procedures  :    TTE -  1. Technically difficult study, very limited views. Left ventricular ejection fraction, by estimation, is 60 to 65%. The left ventricle has grossly normal function. Left ventricular endocardial border not optimally defined to evaluate regional wall motion. Left ventricular diastolic function could not be evaluated.  2. Right ventricule is poorly visualized but appears systolic function is grossly normal. The right ventricular size appears mildly enlarged.  3. The mitral valve is normal in structure. No evidence of mitral valve regurgitation.  4. The aortic valve was not well visualized. Aortic valve regurgitation is not visualized. Aortic stenosis evaluation was limited given technically difficult study, but suspect mild AS (MG , Vmax 2.1 m/s).  MRI - Mild spinal stenosis L2-3.  Moderate spinal stenosis L3-4 Severe spinal stenosis L4-5 with severe subarticular stenosis bilaterally. Large central disc protrusion  Disc degeneration and prominent spurring L5-S1 with superimposed central disc protrusion. Moderate spinal stenosis and moderate subarticular and foraminal stenosis bilaterally L5-S1.  PUD Prophylaxis : PPI  Disposition Plan  :    Status is: Inpatient  Remains inpatient appropriate because:Ongoing diagnostic testing needed not appropriate for outpatient work up and Unsafe d/c plan   Dispo: The patient is from: Home              Anticipated d/c is to: Home              Anticipated d/c date is: 3 days              Patient currently is not medically stable to d/c.   DVT Prophylaxis  :  Lovenox    Lab Results  Component Value Date   PLT 128 (L) 12/12/2019    Diet :  Diet Order            Diet heart healthy/carb modified Room service appropriate? Yes; Fluid consistency: Thin  Diet effective now                  Inpatient Medications Scheduled Meds: . busPIRone  10 mg Oral TID  . DULoxetine  60 mg Oral Daily  . enoxaparin (LOVENOX) injection  40 mg Subcutaneous Q24H  . folic acid  1 mg Oral Daily  . gabapentin  100 mg Oral TID  . insulin aspart  0-9 Units Subcutaneous TID WC  . multivitamin with minerals  1 tablet Oral Daily  . sodium chloride flush  3 mL Intravenous Q12H  . thiamine  100 mg Oral Daily   Or  . thiamine  100 mg Intravenous Daily   Continuous Infusions: . lactated ringers 50 mL/hr at 12/13/19 0949   PRN Meds:.acetaminophen **OR** [DISCONTINUED] acetaminophen, labetalol, oxyCODONE, senna-docusate  Antibiotics  :   Anti-infectives (From admission, onward)   None          Objective:   Vitals:   12/13/19  0121 12/13/19 0444 12/13/19 0726 12/13/19 0800  BP:  (!) 135/76 (!) 153/98   Pulse:  88 83 88  Resp:  19 12   Temp: 97.6 F (36.4 C) 98.3 F (36.8 C) 97.6 F (36.4 C)   TempSrc: Oral Oral Oral   SpO2:  96% 98%   Weight: (!) 93.4 kg (!) 94 kg    Height:  5\' 6"  (1.676 m)      SpO2: 98 %  Wt Readings from Last 3 Encounters:  12/13/19  (!) 94 kg  04/16/19 95.9 kg  05/12/18 86.2 kg     Intake/Output Summary (Last 24 hours) at 12/13/2019 0951 Last data filed at 12/13/2019 0800 Gross per 24 hour  Intake --  Output 1900 ml  Net -1900 ml     Physical Exam  Awake Alert, No new F.N deficits, Normal affect [place], small bruise midline on the nasal bridge, pupils equal in size and reactive to light Supple Neck,No JVD, No cervical lymphadenopathy appriciated.  Symmetrical Chest wall movement, Good air movement bilaterally, CTAB RRR,No Gallops,Rubs or new Murmurs, No Parasternal Heave +ve B.Sounds, Abd Soft, No tenderness, No organomegaly appriciated, No rebound - guarding or rigidity. No Cyanosis, Clubbing or edema, No new Rash or bruise      Data Review:    Recent Labs  Lab 12/11/19 2222 12/12/19 0246  WBC 9.7 8.1  HGB 14.0 13.2  HCT 42.2 40.3  PLT 167 128*  MCV 85.4 85.9  MCH 28.3 28.1  MCHC 33.2 32.8  RDW 16.5* 16.7*  LYMPHSABS 2.1  --   MONOABS 1.1*  --   EOSABS 0.1  --   BASOSABS 0.1  --     Recent Labs  Lab 12/11/19 2222 12/11/19 2223 12/12/19 0246 12/12/19 1236  NA  --  139 139  --   K  --  3.7 3.9  --   CL  --  98 101  --   CO2  --  26 26  --   GLUCOSE  --  175* 176*  --   BUN  --  21* 20  --   CREATININE  --  2.89* 2.19*  --   CALCIUM  --  8.3* 8.5*  --   AST  --  43* 40  --   ALT  --  31 29  --   ALKPHOS  --  79 79  --   BILITOT  --  1.1 0.3  --   ALBUMIN  --  3.6 3.5  --   MG  --  2.2 1.9  --   INR 1.1  --   --   --   HGBA1C  --   --   --  6.6*    Recent Labs  Lab 12/12/19 0100  SARSCOV2NAA NEGATIVE    ------------------------------------------------------------------------------------------------------------------ No results for input(s): CHOL, HDL, LDLCALC, TRIG, CHOLHDL, LDLDIRECT in the last 72 hours.  Lab Results  Component Value Date   HGBA1C 6.6 (H) 12/12/2019    ------------------------------------------------------------------------------------------------------------------ No results for input(s): TSH, T4TOTAL, T3FREE, THYROIDAB in the last 72 hours.  Invalid input(s): FREET3 ------------------------------------------------------------------------------------------------------------------ No results for input(s): VITAMINB12, FOLATE, FERRITIN, TIBC, IRON, RETICCTPCT in the last 72 hours.  Coagulation profile Recent Labs  Lab 12/11/19 2222  INR 1.1    No results for input(s): DDIMER in the last 72 hours.  Cardiac Enzymes No results for input(s): CKMB, TROPONINI, MYOGLOBIN in the last 168 hours.  Invalid input(s): CK ------------------------------------------------------------------------------------------------------------------    Component Value Date/Time  BNP 40.1 04/12/2019 0550    Micro Results Recent Results (from the past 240 hour(s))  SARS Coronavirus 2 by RT PCR (hospital order, performed in [facility] hospital lab) Nasopharyngeal Nasopharyngeal Swab     Status: None   Collection Time: 12/12/19  1:00 AM   Specimen: Nasopharyngeal Swab  Result Value Ref Range Status   SARS Coronavirus 2 NEGATIVE NEGATIVE Final    Comment: (NOTE) SARS-CoV-2 target nucleic acids are NOT DETECTED.  The SARS-CoV-2 RNA is generally detectable in upper and lower respiratory specimens during the acute phase of infection. The lowest concentration of SARS-CoV-2 viral copies this assay can detect is 250 copies / mL. A negative result does not preclude SARS-CoV-2 infection and should not be used as the sole basis for treatment or other patient management decisions.  A negative result may occur with improper specimen collection / handling, submission of specimen other than nasopharyngeal swab, presence of viral mutation(s) within the areas targeted by this assay, and inadequate number of viral copies (<250 copies / mL). A negative result must  be combined with clinical observations, patient history, and epidemiological information.  Fact Sheet for Patients:   BoilerBrush.com.cy  Fact Sheet for Healthcare Providers: https://pope.com/  This test is not yet approved or  cleared by the [place] FDA and has been authorized for detection and/or diagnosis of SARS-CoV-2 by FDA under an Emergency Use Authorization (EUA).  This EUA will remain in effect (meaning this test can be used) for the duration of the COVID-19 declaration under Section 564(b)(1) of the Act, 21 U.S.C. section 360bbb-3(b)(1), unless the authorization is terminated or revoked sooner.  Performed at [facility], [address] [place], [place] [address]     Radiology Reports X-ray chest PA and lateral  Result Date: 12/12/2019 CLINICAL DATA:  Syncope, orthostatic hypotension EXAM: CHEST - 2 VIEW COMPARISON:  12/03/2019 FINDINGS: Frontal and lateral views of the chest demonstrate an unremarkable cardiac silhouette. No airspace disease, effusion, or pneumothorax. No acute bony abnormalities. IMPRESSION: 1. No acute intrathoracic process. Electronically Signed   By: Sharlet Salina M.D.   On: 12/12/2019 02:01   CT HEAD WO CONTRAST  Result Date: 12/12/2019 CLINICAL DATA:  Syncope. EXAM: CT HEAD WITHOUT CONTRAST TECHNIQUE: Contiguous axial images were obtained from the base of the skull through the vertex without intravenous contrast. COMPARISON:  04/04/2019 FINDINGS: Brain: There is no evidence for acute hemorrhage, hydrocephalus, mass lesion, or abnormal extra-axial fluid collection. No definite CT evidence for acute infarction. Vascular: No hyperdense vessel or unexpected calcification. Skull: No evidence for fracture. No worrisome lytic or sclerotic lesion. Sinuses/Orbits: The visualized paranasal sinuses and mastoid air cells are clear. Visualized portions of the globes and intraorbital fat are  unremarkable. Other: None. IMPRESSION: Unremarkable exam.  No acute intracranial abnormality. Electronically Signed   By: Kennith Center M.D.   On: 12/12/2019 12:29   MR LUMBAR SPINE WO CONTRAST  Result Date: 12/12/2019 CLINICAL DATA:  Low back pain with progressive neurologic deficit. Fall. EXAM: MRI LUMBAR SPINE WITHOUT CONTRAST TECHNIQUE: Multiplanar, multisequence MR imaging of the lumbar spine was performed. No intravenous contrast was administered. COMPARISON:  Lumbar radiographs 12/03/2019 FINDINGS: Segmentation:  Normal Alignment:  Slight retrolisthesis L1-2, L2-3, L3-4, L4-5, L5-S1 Vertebrae:  Normal bone marrow.  Negative for fracture or mass. Conus medullaris and cauda equina: Conus extends to the L1-2 level. Conus and cauda equina appear normal. Paraspinal and other soft tissues: Negative for paraspinous mass or adenopathy. Disc levels: L1-2: Mild disc degeneration  and disc bulging. Left lateral osteophyte formation. Negative for spinal or foraminal stenosis L2-3: Mild disc degeneration and disc bulging. Mild facet hypertrophy bilaterally. Mild spinal stenosis and mild subarticular stenosis bilaterally L3-4: Disc degeneration with diffuse disc bulging and endplate spurring. Shallow central disc protrusion. Mild facet hypertrophy bilaterally. Moderate spinal stenosis and moderate subarticular stenosis bilaterally L4-5: Disc degeneration with diffuse disc bulging and endplate spurring. Large central disc protrusion with downgoing disc material causing compression of thecal sac and severe spinal stenosis. Moderate facet and ligamentum flavum hypertrophy contributes to stenosis. There is severe subarticular stenosis bilaterally L5-S1: Disc degeneration with prominent diffuse endplate osteophyte formation. Superimposed central disc protrusion with downgoing disc material. Moderate spinal stenosis and moderate subarticular and foraminal stenosis bilaterally. Bilateral facet hypertrophy. IMPRESSION: Mild  spinal stenosis L2-3.  Moderate spinal stenosis L3-4 Severe spinal stenosis L4-5 with severe subarticular stenosis bilaterally. Large central disc protrusion Disc degeneration and prominent spurring L5-S1 with superimposed central disc protrusion. Moderate spinal stenosis and moderate subarticular and foraminal stenosis bilaterally L5-S1. Electronically Signed   By: Marlan Palauharles  Clark M.D.   On: 12/12/2019 11:24   ECHOCARDIOGRAM COMPLETE  Result Date: 12/12/2019    ECHOCARDIOGRAM REPORT   Patient Name:   Melissa MontaneSCOTT D Perrelli Date of Exam: 12/12/2019 Medical Rec #:  [id number]          Height:       66.0 in Accession #:    [id number]         Weight:       211.5 lb Date of Birth:  1961-02-04          BSA:          2.048 m Patient Age:    59 years           BP:           150/98 mmHg Patient Gender: M                  HR:           96 bpm. Exam Location:  Inpatient Procedure: 2D Echo Indications:    syncope  History:        Patient has prior history of Echocardiogram examinations, most                 recent 12/25/2015. Risk Factors:Diabetes and Former Smoker.  Sonographer:    Celene SkeenVijay Shankar RDCS (AE) Referring Phys: [id number] DAVID MANUEL ORTIZ  Sonographer Comments: No parasternal window and Technically difficult study due to poor echo windows. Image acquisition challenging due to patient body habitus and Image acquisition challenging due to respiratory motion. IMPRESSIONS  1. Technically difficult study, very limited views. Left ventricular ejection fraction, by estimation, is 60 to 65%. The left ventricle has grossly normal function. Left ventricular endocardial border not optimally defined to evaluate regional wall motion. Left ventricular diastolic function could not be evaluated.  2. Right ventricule is poorly visualized but appears systolic function is grossly normal. The right ventricular size appears mildly enlarged.  3. The mitral valve is normal in structure. No evidence of mitral valve regurgitation.  4. The aortic  valve was not well visualized. Aortic valve regurgitation is not visualized. Aortic stenosis evaluation was limited given technically difficult study, but suspect mild AS (MG 10mmHg, Vmax 2.1 m/s) FINDINGS  Left Ventricle: Left ventricular ejection fraction, by estimation, is 60 to 65%. The left ventricle has normal function. Left ventricular endocardial border not optimally defined to evaluate regional wall motion. The left ventricular  internal cavity size was normal in size. There is mild left ventricular hypertrophy. Left ventricular diastolic function could not be evaluated. Right Ventricle: The right ventricular size is mildly enlarged. Right vetricular wall thickness was not assessed. Right ventricular systolic function is normal. Left Atrium: Left atrial size was not well visualized. Right Atrium: Right atrial size was not well visualized. Pericardium: Trivial pericardial effusion is present. Mitral Valve: The mitral valve is normal in structure. No evidence of mitral valve regurgitation. Tricuspid Valve: The tricuspid valve is grossly normal. Tricuspid valve regurgitation is not demonstrated. Aortic Valve: The aortic valve was not well visualized. Aortic valve regurgitation is not visualized. Mild aortic stenosis is present. Aortic valve mean gradient measures 10.0 mmHg. Aortic valve peak gradient measures 16.8 mmHg. Pulmonic Valve: The pulmonic valve was not well visualized. Pulmonic valve regurgitation is not visualized. Aorta: The aortic root was not well visualized. IAS/Shunts: The interatrial septum was not well visualized.   Diastology LV e' lateral: 9.46 cm/s  AORTIC VALVE AV Vmax:           205.00 cm/s AV Vmean:          155.000 cm/s AV VTI:            0.343 m AV Peak Grad:      16.8 mmHg AV Mean Grad:      10.0 mmHg LVOT Vmax:         80.00 cm/s LVOT Vmean:        48.200 cm/s LVOT VTI:          0.119 m LVOT/AV VTI ratio: 0.35  SHUNTS Systemic VTI: 0.12 m Epifanio Lesches MD Electronically signed  by Epifanio Lesches MD Signature Date/Time: 12/12/2019/6:54:35 PM    Final     Time Spent in minutes  30   Susa Raring M.D on 12/13/2019 at 9:51 AM  To page go to [email or web address] - password [facility]

## 2019-12-14 LAB — CBC WITH DIFFERENTIAL/PLATELET
Abs Immature Granulocytes: 0 10*3/uL (ref 0.00–0.07)
Basophils Absolute: 0 10*3/uL (ref 0.0–0.1)
Basophils Relative: 1 %
Eosinophils Absolute: 0.1 10*3/uL (ref 0.0–0.5)
Eosinophils Relative: 4 %
HCT: 38.5 % — ABNORMAL LOW (ref 39.0–52.0)
Hemoglobin: 12.5 g/dL — ABNORMAL LOW (ref 13.0–17.0)
Immature Granulocytes: 0 %
Lymphocytes Relative: 27 %
Lymphs Abs: 0.7 10*3/uL (ref 0.7–4.0)
MCH: 27.4 pg (ref 26.0–34.0)
MCHC: 32.5 g/dL (ref 30.0–36.0)
MCV: 84.4 fL (ref 80.0–100.0)
Monocytes Absolute: 0.4 10*3/uL (ref 0.1–1.0)
Monocytes Relative: 14 %
Neutro Abs: 1.5 10*3/uL — ABNORMAL LOW (ref 1.7–7.7)
Neutrophils Relative %: 54 %
Platelets: 63 10*3/uL — ABNORMAL LOW (ref 150–400)
RBC: 4.56 MIL/uL (ref 4.22–5.81)
RDW: 15.6 % — ABNORMAL HIGH (ref 11.5–15.5)
WBC: 2.8 10*3/uL — ABNORMAL LOW (ref 4.0–10.5)
nRBC: 0 % (ref 0.0–0.2)

## 2019-12-14 LAB — APTT: aPTT: 28 seconds (ref 24–36)

## 2019-12-14 LAB — COMPREHENSIVE METABOLIC PANEL
ALT: 26 U/L (ref 0–44)
AST: 31 U/L (ref 15–41)
Albumin: 3.3 g/dL — ABNORMAL LOW (ref 3.5–5.0)
Alkaline Phosphatase: 73 U/L (ref 38–126)
Anion gap: 6 (ref 5–15)
BUN: 11 mg/dL (ref 6–20)
CO2: 26 mmol/L (ref 22–32)
Calcium: 9.1 mg/dL (ref 8.9–10.3)
Chloride: 103 mmol/L (ref 98–111)
Creatinine, Ser: 1.04 mg/dL (ref 0.61–1.24)
GFR calc Af Amer: >60 mL/min (ref >60)
GFR calc non Af Amer: >60 mL/min (ref >60)
Glucose, Bld: 191 mg/dL — ABNORMAL HIGH (ref 70–99)
Potassium: 4.1 mmol/L (ref 3.5–5.1)
Sodium: 135 mmol/L (ref 135–145)
Total Bilirubin: 1.1 mg/dL (ref 0.3–1.2)
Total Protein: 6 g/dL — ABNORMAL LOW (ref 6.5–8.1)

## 2019-12-14 LAB — BRAIN NATRIURETIC PEPTIDE: B Natriuretic Peptide: 79.7 pg/mL (ref 0.0–100.0)

## 2019-12-14 LAB — CBC
HCT: 38.7 % — ABNORMAL LOW (ref 39.0–52.0)
Hemoglobin: 12.7 g/dL — ABNORMAL LOW (ref 13.0–17.0)
MCH: 27.7 pg (ref 26.0–34.0)
MCHC: 32.8 g/dL (ref 30.0–36.0)
MCV: 84.5 fL (ref 80.0–100.0)
Platelets: 62 10*3/uL — ABNORMAL LOW (ref 150–400)
RBC: 4.58 MIL/uL (ref 4.22–5.81)
RDW: 15.6 % — ABNORMAL HIGH (ref 11.5–15.5)
WBC: 2.9 10*3/uL — ABNORMAL LOW (ref 4.0–10.5)
nRBC: 0 % (ref 0.0–0.2)

## 2019-12-14 LAB — PROTIME-INR
INR: 1.1 (ref 0.8–1.2)
Prothrombin Time: 13.3 seconds (ref 11.4–15.2)

## 2019-12-14 LAB — TYPE AND SCREEN
ABO/RH(D): O POS
Antibody Screen: NEGATIVE

## 2019-12-14 LAB — GLUCOSE, CAPILLARY
Glucose-Capillary: 173 mg/dL — ABNORMAL HIGH (ref 70–99)
Glucose-Capillary: 141 mg/dL — ABNORMAL HIGH (ref 70–99)
Glucose-Capillary: 163 mg/dL — ABNORMAL HIGH (ref 70–99)
Glucose-Capillary: 141 mg/dL — ABNORMAL HIGH (ref 70–99)

## 2019-12-14 LAB — MAGNESIUM: Magnesium: 1.8 mg/dL (ref 1.7–2.4)

## 2019-12-14 LAB — SURGICAL PCR SCREEN
MRSA, PCR: NEGATIVE
Staphylococcus aureus: NEGATIVE

## 2019-12-14 LAB — C-REACTIVE PROTEIN: CRP: 0.6 mg/dL (ref <1.0)

## 2019-12-14 MED ORDER — TRANEXAMIC ACID-NACL 1000-0.7 MG/100ML-% IV SOLN
1000.0000 mg | INTRAVENOUS | Status: DC
  Filled 2019-12-14: qty 100

## 2019-12-14 MED ORDER — METHOCARBAMOL 1000 MG/10ML IJ SOLN
500.0000 mg | Freq: Three times a day (TID) | INTRAVENOUS | Status: DC | PRN
  Filled 2019-12-14: qty 5

## 2019-12-14 MED ORDER — CHLORHEXIDINE GLUCONATE CLOTH 2 % EX PADS
6.0000 | MEDICATED_PAD | Freq: Every day | CUTANEOUS | Status: AC
  Administered 2019-12-14 – 2019-12-16 (×2): 6 via TOPICAL

## 2019-12-14 NOTE — Progress Notes (Addendum)
NEUROSURGERY PROGRESS NOTE  S: No issues overnight. Still BLE paresthesias, with therapy has a challenging time with LLE pain/strength  O: EXAM:  BP (!) 146/97   Pulse 87   Temp 97.8 F (36.6 C) (Oral)   Resp 16   Ht 5\' 6"  (1.676 m)   Wt (!) 101.6 kg   SpO2 96%   BMI 36.15 kg/m   Awake, alert, oriented  Speech fluent, appropriate  CN grossly intact  5/5 BUE 5/5 RLE LLE 4+/5 HF, KE, 4/5 DF/PF    ASSESSMENT:  59 y.o. male with: 1.  Lumbar stenosis, moderate to severe, L3-4, L4-5, L5-S1 with neurogenic claudication 2.  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  Please do not hesitate to call with questions or concerns.   09-20-1974, DO Neurosurgeon [facility] Cell: [phone]

## 2019-12-14 NOTE — Plan of Care (Signed)
  Problem: Education: Goal: Knowledge of disease or condition will improve Outcome: Progressing Goal: Understanding of discharge needs will improve Outcome: Progressing   Problem: Health Behavior/Discharge Planning: Goal: Ability to identify changes in lifestyle to reduce recurrence of condition will improve Outcome: Progressing Goal: Identification of resources available to assist in meeting health care needs will improve Outcome: Progressing   Problem: Physical Regulation: Goal: Complications related to the disease process, condition or treatment will be avoided or minimized Outcome: Progressing   Problem: Safety: Goal: Ability to remain free from injury will improve Outcome: Progressing   Problem: Clinical Measurements: Goal: Ability to maintain clinical measurements within normal limits will improve Outcome: Progressing Goal: Will remain free from infection Outcome: Progressing Goal: Diagnostic test results will improve Outcome: Progressing Goal: Respiratory complications will improve Outcome: Progressing Goal: Cardiovascular complication will be avoided Outcome: Progressing   Problem: Nutrition: Goal: Adequate nutrition will be maintained Outcome: Progressing   Problem: Coping: Goal: Level of anxiety will decrease Outcome: Progressing   Problem: Pain Managment: Goal: General experience of comfort will improve Outcome: Progressing

## 2019-12-14 NOTE — Progress Notes (Addendum)
PROGRESS NOTE                                                                                                                                                                                                             Patient Demographics:    Connor Vaughn, is a 59 y.o. male, DOB - 04/07/1961, WUJ:[id number]  Admit date - 12/11/2019   Admitting Physician [name], MD  Outpatient Primary MD for the patient is [name], [name], MD  LOS - 2  Chief Complaint  Patient presents with  . Hypotension  . Loss of Consciousness       Brief Narrative - This is a 59 year old male with history of alcohol abuse, HCV s/p treatment, frequent syncopal episodes, type 2 diabetes, depression/anxiety, cirrhosis, chronic back pain, hypertension and was recently started on losartan who presented to the ED on 7/21 from home following multiple episodes of syncope within a 30-minute time span   Subjective:   Patient in bed, appears comfortable, denies any headache, no fever, no chest pain or pressure, no shortness of breath , no abdominal pain.  Continues to have low back pain, no focal weakness at rest.    Assessment  & Plan :     1.  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  Does have mild left leg weakness at times upon exertion, so seems to have C6-C7 disc protrusion with some stenosis, TSH stable, B12 low normal, neurosurgery following and contemplating surgical correction this admission will defer management of this problem to neurosurgery.  Also being seen by PT OT.  2.  Loss of consciousness.  This was after mechanical fall when he hit his head on the floor.  No seizure-like activity, no preceding symptoms, Head CT nonacute, no headache or focal deficits.  PT OT and monitor.  3.  Back pain.  Chronic.  Supportive care and as in  #1 above.  4.  AKI on CKD 2.  Baseline creatinine around 1.3, likely due to dehydration from home dose diuretics.  Offending medications held, hydrate and monitor.  Monitor BMP  5.  Alcohol use.  Patient had alcohol levels upon presentation however on detailed interview he says he only drinks once or twice every 2 weeks.  Will monitor.  If he withdraws then clearly he is not stating the truth and will treat appropriately.  6.  History  of alcoholic and hep C cirrhosis - Hep C  has been treated, now claims to have quit alcohol for several months, has chronic thrombocytopenia, outpatient GI follow-up.  7. Dehydration and hypotension.  Stable echocardiogram, blood pressure and orthostatics have resolved after IV fluids and TED stockings.  8. DM type II.  Sliding scale.  Lab Results  Component Value Date   HGBA1C 6.6 (H) 12/12/2019   CBG (last 3)  Recent Labs    12/13/19 1541 12/13/19 2101 12/14/19 0747  GLUCAP 144* 134* 173*     Condition - Extremely Guarded  Family Communication  : Called wife [name] on listed cell phone number [phone] no response switched off, called work  [phone] on 12/13/2019 at 10 AM again no response.  Code Status : Full Code  Consults  : NS   Procedures  :    TTE -  1. Technically difficult study, very limited views. Left ventricular ejection fraction, by estimation, is 60 to 65%. The left ventricle has grossly normal function. Left ventricular endocardial border not optimally defined to evaluate regional wall motion. Left ventricular diastolic function could not be evaluated.  2. Right ventricule is poorly visualized but appears systolic function is grossly normal. The right ventricular size appears mildly enlarged.  3. The mitral valve is normal in structure. No evidence of mitral valve regurgitation.  4. The aortic valve was not well visualized. Aortic valve regurgitation is not visualized. Aortic stenosis evaluation was limited given technically  difficult study, but suspect mild AS (MG , Vmax 2.1 m/s).  MRI C Spine -  Cervical spondylosis superimposed upon a congenitally narrow cervical spinal canal, as outlined and most notably as follows. At C6-C7, a left center disc protrusion contributes to moderate spinal canal stenosis to the left. Additionally, the disc protrusion contacts and mildly flattens the left ventral aspect of the spinal cord. Additionally, the disc protrusion has the potential to affect the exiting left C7 nerve root within the left foraminal entry zone. No more than mild spinal canal stenosis at the remaining levels. Additional sites of neural foraminal narrowing as described and greatest bilaterally at C4-C5 (moderate/severe right, moderate left). Also of note, there is moderate right neural foraminal narrowing at C3-C4.  MRI L Spine - Mild spinal stenosis L2-3.  Moderate spinal stenosis L3-4 Severe spinal stenosis L4-5 with severe subarticular stenosis bilaterally. Large central disc protrusion Disc degeneration and prominent spurring L5-S1 with superimposed central disc protrusion. Moderate spinal stenosis and moderate subarticular and foraminal stenosis bilaterally L5-S1.   PUD Prophylaxis : PPI  Disposition Plan  :    Status is: Inpatient  Remains inpatient appropriate because:Ongoing diagnostic testing needed not appropriate for outpatient work up and Unsafe d/c plan   Dispo: The patient is from: Home              Anticipated d/c is to: Home              Anticipated d/c date is: 3 days              Patient currently is not medically stable to d/c.   DVT Prophylaxis  :  Lovenox    Lab Results  Component Value Date   PLT 128 (L) 12/12/2019    Diet :  Diet Order            Diet heart healthy/carb modified Room service appropriate? Yes; Fluid consistency: Thin  Diet effective now  Inpatient Medications Scheduled Meds: . busPIRone  10 mg Oral TID  . DULoxetine  60 mg Oral  Daily  . enoxaparin (LOVENOX) injection  40 mg Subcutaneous Q24H  . folic acid  1 mg Oral Daily  . gabapentin  100 mg Oral TID  . insulin aspart  0-9 Units Subcutaneous TID WC  . multivitamin with minerals  1 tablet Oral Daily  . pantoprazole  40 mg Oral Daily  . sodium chloride flush  3 mL Intravenous Q12H  . thiamine  100 mg Oral Daily   Or  . thiamine  100 mg Intravenous Daily   Continuous Infusions:  PRN Meds:.acetaminophen **OR** [DISCONTINUED] acetaminophen, labetalol, oxyCODONE, senna-docusate  Antibiotics  :   Anti-infectives (From admission, onward)   None          Objective:   Vitals:   12/14/19 0400 12/14/19 0500 12/14/19 0806 12/14/19 0807  BP: (!) 142/96  (!) 146/97   Pulse: 76  87   Resp: 12   16  Temp: 98.1 F (36.7 C)   97.8 F (36.6 C)  TempSrc: Oral   Oral  SpO2: 97%  96%   Weight:  (!) 101.6 kg    Height:        SpO2: 96 %  Wt Readings from Last 3 Encounters:  12/14/19 (!) 101.6 kg  04/16/19 95.9 kg  05/12/18 86.2 kg     Intake/Output Summary (Last 24 hours) at 12/14/2019 0915 Last data filed at 12/14/2019 9518 Gross per 24 hour  Intake 568.49 ml  Output 2000 ml  Net -1431.51 ml     Physical Exam  Awake Alert, No new F.N deficits, Normal affect [place], small bruise midline on the nasal bridge, pupils equal in size and reactive to light Supple Neck,No JVD, No cervical lymphadenopathy appriciated.  Symmetrical Chest wall movement, Good air movement bilaterally, CTAB RRR,No Gallops,Rubs or new Murmurs, No Parasternal Heave +ve B.Sounds, Abd Soft, No tenderness, No organomegaly appriciated, No rebound - guarding or rigidity. No Cyanosis, Clubbing or edema, No new Rash or bruise      Data Review:    Recent Labs  Lab 12/11/19 2222 12/12/19 0246  WBC 9.7 8.1  HGB 14.0 13.2  HCT 42.2 40.3  PLT 167 128*  MCV 85.4 85.9  MCH 28.3 28.1  MCHC 33.2 32.8  RDW 16.5* 16.7*  LYMPHSABS 2.1  --   MONOABS 1.1*  --   EOSABS 0.1  --    BASOSABS 0.1  --     Recent Labs  Lab 12/11/19 2222 12/11/19 2223 12/12/19 0246 12/12/19 1236 12/13/19 1104  NA  --  139 139  --   --   K  --  3.7 3.9  --   --   CL  --  98 101  --   --   CO2  --  26 26  --   --   GLUCOSE  --  175* 176*  --   --   BUN  --  21* 20  --   --   CREATININE  --  2.89* 2.19*  --   --   CALCIUM  --  8.3* 8.5*  --   --   AST  --  43* 40  --   --   ALT  --  31 29  --   --   ALKPHOS  --  79 79  --   --   BILITOT  --  1.1 0.3  --   --  ALBUMIN  --  3.6 3.5  --   --   MG  --  2.2 1.9  --   --   INR 1.1  --   --   --   --   TSH  --   --   --   --  3.019  HGBA1C  --   --   --  6.6*  --     Recent Labs  Lab 12/12/19 0100  SARSCOV2NAA NEGATIVE    ------------------------------------------------------------------------------------------------------------------ No results for input(s): CHOL, HDL, LDLCALC, TRIG, CHOLHDL, LDLDIRECT in the last 72 hours.  Lab Results  Component Value Date   HGBA1C 6.6 (H) 12/12/2019   ------------------------------------------------------------------------------------------------------------------ Recent Labs    12/13/19 1104  TSH 3.019   ------------------------------------------------------------------------------------------------------------------ Recent Labs    12/13/19 1104  VITAMINB12 349  FOLATE 35.1  FERRITIN 54  TIBC 430  IRON 313*  RETICCTPCT 0.9    Coagulation profile Recent Labs  Lab 12/11/19 2222  INR 1.1    No results for input(s): DDIMER in the last 72 hours.  Cardiac Enzymes No results for input(s): CKMB, TROPONINI, MYOGLOBIN in the last 168 hours.  Invalid input(s): CK ------------------------------------------------------------------------------------------------------------------    Component Value Date/Time   BNP 40.1 04/12/2019 0550    Micro Results Recent Results (from the past 240 hour(s))  SARS Coronavirus 2 by RT PCR (hospital order, performed in [facility]  hospital lab) Nasopharyngeal Nasopharyngeal Swab     Status: None   Collection Time: 12/12/19  1:00 AM   Specimen: Nasopharyngeal Swab  Result Value Ref Range Status   SARS Coronavirus 2 NEGATIVE NEGATIVE Final    Comment: (NOTE) SARS-CoV-2 target nucleic acids are NOT DETECTED.  The SARS-CoV-2 RNA is generally detectable in upper and lower respiratory specimens during the acute phase of infection. The lowest concentration of SARS-CoV-2 viral copies this assay can detect is 250 copies / mL. A negative result does not preclude SARS-CoV-2 infection and should not be used as the sole basis for treatment or other patient management decisions.  A negative result may occur with improper specimen collection / handling, submission of specimen other than nasopharyngeal swab, presence of viral mutation(s) within the areas targeted by this assay, and inadequate number of viral copies (<250 copies / mL). A negative result must be combined with clinical observations, patient history, and epidemiological information.  Fact Sheet for Patients:   BoilerBrush.com.cy  Fact Sheet for Healthcare Providers: https://pope.com/  This test is not yet approved or  cleared by the [place] FDA and has been authorized for detection and/or diagnosis of SARS-CoV-2 by FDA under an Emergency Use Authorization (EUA).  This EUA will remain in effect (meaning this test can be used) for the duration of the COVID-19 declaration under Section 564(b)(1) of the Act, 21 U.S.C. section 360bbb-3(b)(1), unless the authorization is terminated or revoked sooner.  Performed at [facility], [address], [place], [place] [address]     Radiology Reports X-ray chest PA and lateral  Result Date: 12/12/2019 CLINICAL DATA:  Syncope, orthostatic hypotension EXAM: CHEST - 2 VIEW COMPARISON:  12/03/2019 FINDINGS: Frontal and lateral views of the chest  demonstrate an unremarkable cardiac silhouette. No airspace disease, effusion, or pneumothorax. No acute bony abnormalities. IMPRESSION: 1. No acute intrathoracic process. Electronically Signed   By: Sharlet Salina M.D.   On: 12/12/2019 02:01   CT HEAD WO CONTRAST  Result Date: 12/12/2019 CLINICAL DATA:  Syncope. EXAM: CT HEAD WITHOUT CONTRAST TECHNIQUE: Contiguous axial images were  obtained from the base of the skull through the vertex without intravenous contrast. COMPARISON:  04/04/2019 FINDINGS: Brain: There is no evidence for acute hemorrhage, hydrocephalus, mass lesion, or abnormal extra-axial fluid collection. No definite CT evidence for acute infarction. Vascular: No hyperdense vessel or unexpected calcification. Skull: No evidence for fracture. No worrisome lytic or sclerotic lesion. Sinuses/Orbits: The visualized paranasal sinuses and mastoid air cells are clear. Visualized portions of the globes and intraorbital fat are unremarkable. Other: None. IMPRESSION: Unremarkable exam.  No acute intracranial abnormality. Electronically Signed   By: Kennith Center M.D.   On: 12/12/2019 12:29   MR CERVICAL SPINE WO CONTRAST  Result Date: 12/13/2019 CLINICAL DATA:  Myelopathy, acute or progressive. Additional history provided by scanning technologist: Patient reports neck pain intermittently for 2 years with intermittent numbness and tingling in hands, patient works as a Curator. EXAM: MRI CERVICAL SPINE WITHOUT CONTRAST TECHNIQUE: Multiplanar, multisequence MR imaging of the cervical spine was performed. No intravenous contrast was administered. COMPARISON:  CT cervical spine 05/12/2018 FINDINGS: Alignment: Reversal of the expected cervical lordosis. Minimal C3-C4 grade 1 anterolisthesis. Vertebrae: No significant osseous lesion or significant marrow edema. Cord: No definite spinal cord signal abnormality is identified. Posterior Fossa, vertebral arteries, paraspinal tissues: No abnormality identified within  included portions of the posterior fossa. Flow voids preserved within the imaged cervical vertebral arteries. STIR hyperintensity within the C4-C5 interspinous space appears vascular in nature. Paraspinal soft tissues otherwise unremarkable. Disc levels: Mild diffuse disc degeneration throughout the cervical spine. Congenitally narrow cervical spinal canal. C2-C3: Shallow disc bulge. Right-sided uncinate hypertrophy. Facet hypertrophy. Mild relative spinal canal narrowing. Mild right neural foraminal narrowing. C3-C4: Minimal grade 1 retrolisthesis. Disc uncovering with disc bulge. Bilateral disc osteophyte ridge/uncinate hypertrophy. Facet hypertrophy. Mild/moderate spinal canal stenosis. Moderate right neural foraminal narrowing. C4-C5: Disc bulge. Uncinate/facet hypertrophy. Mild/moderate spinal canal stenosis. Bilateral neural foraminal narrowing (moderate/severe right, moderate left). C5-C6: Shallow central disc protrusion. Facet hypertrophy. Mild spinal canal stenosis. No significant foraminal narrowing. C6-C7: Disc bulge. Superimposed left center disc protrusion. Facet hypertrophy. There is moderate spinal canal stenosis to the left. The disc protrusion contacts and mildly flattens the left ventral aspect of the spinal cord. Additionally, the disc protrusion has the potential to affect the exiting left C7 nerve root within the left foraminal entry zone. C7-T1: Facet and ligamentum flavum hypertrophy. No significant disc herniation or stenosis. IMPRESSION: Cervical spondylosis superimposed upon a congenitally narrow cervical spinal canal, as outlined and most notably as follows. At C6-C7, a left center disc protrusion contributes to moderate spinal canal stenosis to the left. Additionally, the disc protrusion contacts and mildly flattens the left ventral aspect of the spinal cord. Additionally, the disc protrusion has the potential to affect the exiting left C7 nerve root within the left foraminal entry zone.  No more than mild spinal canal stenosis at the remaining levels. Additional sites of neural foraminal narrowing as described and greatest bilaterally at C4-C5 (moderate/severe right, moderate left). Also of note, there is moderate right neural foraminal narrowing at C3-C4. Electronically Signed   By: Jackey Loge DO   On: 12/13/2019 14:59   MR LUMBAR SPINE WO CONTRAST  Result Date: 12/12/2019 CLINICAL DATA:  Low back pain with progressive neurologic deficit. Fall. EXAM: MRI LUMBAR SPINE WITHOUT CONTRAST TECHNIQUE: Multiplanar, multisequence MR imaging of the lumbar spine was performed. No intravenous contrast was administered. COMPARISON:  Lumbar radiographs 12/03/2019 FINDINGS: Segmentation:  Normal Alignment:  Slight retrolisthesis L1-2, L2-3, L3-4, L4-5, L5-S1 Vertebrae:  Normal bone marrow.  Negative for fracture or mass. Conus medullaris and cauda equina: Conus extends to the L1-2 level. Conus and cauda equina appear normal. Paraspinal and other soft tissues: Negative for paraspinous mass or adenopathy. Disc levels: L1-2: Mild disc degeneration and disc bulging. Left lateral osteophyte formation. Negative for spinal or foraminal stenosis L2-3: Mild disc degeneration and disc bulging. Mild facet hypertrophy bilaterally. Mild spinal stenosis and mild subarticular stenosis bilaterally L3-4: Disc degeneration with diffuse disc bulging and endplate spurring. Shallow central disc protrusion. Mild facet hypertrophy bilaterally. Moderate spinal stenosis and moderate subarticular stenosis bilaterally L4-5: Disc degeneration with diffuse disc bulging and endplate spurring. Large central disc protrusion with downgoing disc material causing compression of thecal sac and severe spinal stenosis. Moderate facet and ligamentum flavum hypertrophy contributes to stenosis. There is severe subarticular stenosis bilaterally L5-S1: Disc degeneration with prominent diffuse endplate osteophyte formation. Superimposed central disc  protrusion with downgoing disc material. Moderate spinal stenosis and moderate subarticular and foraminal stenosis bilaterally. Bilateral facet hypertrophy. IMPRESSION: Mild spinal stenosis L2-3.  Moderate spinal stenosis L3-4 Severe spinal stenosis L4-5 with severe subarticular stenosis bilaterally. Large central disc protrusion Disc degeneration and prominent spurring L5-S1 with superimposed central disc protrusion. Moderate spinal stenosis and moderate subarticular and foraminal stenosis bilaterally L5-S1. Electronically Signed   By: Marlan Palauharles  Clark M.D.   On: 12/12/2019 11:24   ECHOCARDIOGRAM COMPLETE  Result Date: 12/12/2019    ECHOCARDIOGRAM REPORT   Patient Name:   Melissa MontaneSCOTT D Borras Date of Exam: 12/12/2019 Medical Rec #:  [id number]          Height:       66.0 in Accession #:    [id number]         Weight:       211.5 lb Date of Birth:  [birth year]          BSA:          2.048 m Patient Age:    59 years           BP:           150/98 mmHg Patient Gender: M                  HR:           96 bpm. Exam Location:  Inpatient Procedure: 2D Echo Indications:    syncope  History:        Patient has prior history of Echocardiogram examinations, most                 recent 12/25/2015. Risk Factors:Diabetes and Former Smoker.  Sonographer:    Celene SkeenVijay Shankar RDCS (AE) Referring Phys: [id number] DAVID MANUEL ORTIZ  Sonographer Comments: No parasternal window and Technically difficult study due to poor echo windows. Image acquisition challenging due to patient body habitus and Image acquisition challenging due to respiratory motion. IMPRESSIONS  1. Technically difficult study, very limited views. Left ventricular ejection fraction, by estimation, is 60 to 65%. The left ventricle has grossly normal function. Left ventricular endocardial border not optimally defined to evaluate regional wall motion. Left ventricular diastolic function could not be evaluated.  2. Right ventricule is poorly visualized but appears systolic function  is grossly normal. The right ventricular size appears mildly enlarged.  3. The mitral valve is normal in structure. No evidence of mitral valve regurgitation.  4. The aortic valve was not well visualized. Aortic valve regurgitation is not visualized. Aortic stenosis evaluation was limited given technically difficult study, but suspect mild AS (MG  , Vmax 2.1 m/s) FINDINGS  Left Ventricle: Left ventricular ejection fraction, by estimation, is 60 to 65%. The left ventricle has normal function. Left ventricular endocardial border not optimally defined to evaluate regional wall motion. The left ventricular internal cavity size was normal in size. There is mild left ventricular hypertrophy. Left ventricular diastolic function could not be evaluated. Right Ventricle: The right ventricular size is mildly enlarged. Right vetricular wall thickness was not assessed. Right ventricular systolic function is normal. Left Atrium: Left atrial size was not well visualized. Right Atrium: Right atrial size was not well visualized. Pericardium: Trivial pericardial effusion is present. Mitral Valve: The mitral valve is normal in structure. No evidence of mitral valve regurgitation. Tricuspid Valve: The tricuspid valve is grossly normal. Tricuspid valve regurgitation is not demonstrated. Aortic Valve: The aortic valve was not well visualized. Aortic valve regurgitation is not visualized. Mild aortic stenosis is present. Aortic valve mean gradient measures 10.0 mmHg. Aortic valve peak gradient measures 16.8 mmHg. Pulmonic Valve: The pulmonic valve was not well visualized. Pulmonic valve regurgitation is not visualized. Aorta: The aortic root was not well visualized. IAS/Shunts: The interatrial septum was not well visualized.   Diastology LV e' lateral: 9.46 cm/s  AORTIC VALVE AV Vmax:           205.00 cm/s AV Vmean:          155.000 cm/s AV VTI:            0.343 m AV Peak Grad:      16.8 mmHg AV Mean Grad:      10.0 mmHg LVOT Vmax:          80.00 cm/s LVOT Vmean:        48.200 cm/s LVOT VTI:          0.119 m LVOT/AV VTI ratio: 0.35  SHUNTS Systemic VTI: 0.12 m Epifanio Lesches MD Electronically signed by Epifanio Lesches MD Signature Date/Time: 12/12/2019/6:54:35 PM    Final     Time Spent in minutes  30   Susa Raring M.D on 12/14/2019 at 9:15 AM  To page go to [email or web address] - password [facility]

## 2019-12-15 ENCOUNTER — Inpatient Hospital Stay (HOSPITAL_COMMUNITY): Payer: Medicaid Other | Admitting: Certified Registered Nurse Anesthetist

## 2019-12-15 ENCOUNTER — Encounter (HOSPITAL_COMMUNITY)
Admission: EM | Disposition: A | Discharge status: Home / Self Care | Payer: Self-pay | Source: Home / Self Care | Attending: Internal Medicine

## 2019-12-15 ENCOUNTER — Encounter (HOSPITAL_COMMUNITY): Payer: Self-pay | Admitting: Internal Medicine

## 2019-12-15 ENCOUNTER — Inpatient Hospital Stay (HOSPITAL_COMMUNITY): Payer: Medicaid Other

## 2019-12-15 DIAGNOSIS — M48062 Spinal stenosis, lumbar region with neurogenic claudication: Secondary | ICD-10-CM | POA: Diagnosis present

## 2019-12-15 HISTORY — PX: LUMBAR LAMINECTOMY/DECOMPRESSION MICRODISCECTOMY: SHX5026

## 2019-12-15 LAB — MAGNESIUM: Magnesium: 1.8 mg/dL (ref 1.7–2.4)

## 2019-12-15 LAB — CBC WITH DIFFERENTIAL/PLATELET
Abs Immature Granulocytes: 0.01 10*3/uL (ref 0.00–0.07)
Basophils Absolute: 0 10*3/uL (ref 0.0–0.1)
Basophils Relative: 1 %
Eosinophils Absolute: 0.1 10*3/uL (ref 0.0–0.5)
Eosinophils Relative: 4 %
HCT: 39 % (ref 39.0–52.0)
Hemoglobin: 12.7 g/dL — ABNORMAL LOW (ref 13.0–17.0)
Immature Granulocytes: 0 %
Lymphocytes Relative: 26 %
Lymphs Abs: 0.8 10*3/uL (ref 0.7–4.0)
MCH: 27.8 pg (ref 26.0–34.0)
MCHC: 32.6 g/dL (ref 30.0–36.0)
MCV: 85.3 fL (ref 80.0–100.0)
Monocytes Absolute: 0.5 10*3/uL (ref 0.1–1.0)
Monocytes Relative: 16 %
Neutro Abs: 1.7 10*3/uL (ref 1.7–7.7)
Neutrophils Relative %: 53 %
Platelets: 65 10*3/uL — ABNORMAL LOW (ref 150–400)
RBC: 4.57 MIL/uL (ref 4.22–5.81)
RDW: 15.6 % — ABNORMAL HIGH (ref 11.5–15.5)
WBC: 3.1 10*3/uL — ABNORMAL LOW (ref 4.0–10.5)
nRBC: 0 % (ref 0.0–0.2)

## 2019-12-15 LAB — CBC
HCT: 35.4 % — ABNORMAL LOW (ref 39.0–52.0)
Hemoglobin: 11.8 g/dL — ABNORMAL LOW (ref 13.0–17.0)
MCH: 27.7 pg (ref 26.0–34.0)
MCHC: 33.3 g/dL (ref 30.0–36.0)
MCV: 83.1 fL (ref 80.0–100.0)
Platelets: 75 10*3/uL — ABNORMAL LOW (ref 150–400)
RBC: 4.26 MIL/uL (ref 4.22–5.81)
RDW: 15.6 % — ABNORMAL HIGH (ref 11.5–15.5)
WBC: 5.1 10*3/uL (ref 4.0–10.5)
nRBC: 0 % (ref 0.0–0.2)

## 2019-12-15 LAB — GLUCOSE, CAPILLARY
Glucose-Capillary: 168 mg/dL — ABNORMAL HIGH (ref 70–99)
Glucose-Capillary: 143 mg/dL — ABNORMAL HIGH (ref 70–99)
Glucose-Capillary: 235 mg/dL — ABNORMAL HIGH (ref 70–99)
Glucose-Capillary: 176 mg/dL — ABNORMAL HIGH (ref 70–99)
Glucose-Capillary: 232 mg/dL — ABNORMAL HIGH (ref 70–99)

## 2019-12-15 LAB — BRAIN NATRIURETIC PEPTIDE: B Natriuretic Peptide: 37.6 pg/mL (ref 0.0–100.0)

## 2019-12-15 LAB — COMPREHENSIVE METABOLIC PANEL
ALT: 25 U/L (ref 0–44)
AST: 31 U/L (ref 15–41)
Albumin: 3.2 g/dL — ABNORMAL LOW (ref 3.5–5.0)
Alkaline Phosphatase: 76 U/L (ref 38–126)
Anion gap: 7 (ref 5–15)
BUN: 13 mg/dL (ref 6–20)
CO2: 27 mmol/L (ref 22–32)
Calcium: 9 mg/dL (ref 8.9–10.3)
Chloride: 102 mmol/L (ref 98–111)
Creatinine, Ser: 1.1 mg/dL (ref 0.61–1.24)
GFR calc Af Amer: >60 mL/min (ref >60)
GFR calc non Af Amer: >60 mL/min (ref >60)
Glucose, Bld: 150 mg/dL — ABNORMAL HIGH (ref 70–99)
Potassium: 3.9 mmol/L (ref 3.5–5.1)
Sodium: 136 mmol/L (ref 135–145)
Total Bilirubin: 0.9 mg/dL (ref 0.3–1.2)
Total Protein: 5.9 g/dL — ABNORMAL LOW (ref 6.5–8.1)

## 2019-12-15 LAB — C-REACTIVE PROTEIN: CRP: 0.8 mg/dL (ref <1.0)

## 2019-12-15 SURGERY — LUMBAR LAMINECTOMY/DECOMPRESSION MICRODISCECTOMY 3 LEVELS
Anesthesia: General | Site: Spine Lumbar

## 2019-12-15 MED ORDER — FENTANYL CITRATE (PF) 250 MCG/5ML IJ SOLN
INTRAMUSCULAR | Status: DC | PRN
  Administered 2019-12-15: 50 ug via INTRAVENOUS
  Administered 2019-12-15: 25 ug via INTRAVENOUS
  Administered 2019-12-15: 50 ug via INTRAVENOUS
  Administered 2019-12-15: 25 ug via INTRAVENOUS
  Administered 2019-12-15 (×2): 50 ug via INTRAVENOUS

## 2019-12-15 MED ORDER — CHLORHEXIDINE GLUCONATE 0.12 % MT SOLN
15.0000 mL | Freq: Once | OROMUCOSAL | Status: AC
  Administered 2019-12-15: 15 mL via OROMUCOSAL
  Filled 2019-12-15: qty 15

## 2019-12-15 MED ORDER — BACITRACIN ZINC 500 UNIT/GM EX OINT
TOPICAL_OINTMENT | CUTANEOUS | Status: DC | PRN
  Administered 2019-12-15: 1 via TOPICAL

## 2019-12-15 MED ORDER — SODIUM CHLORIDE 0.9 % IV SOLN: 250.0000 mL | INTRAVENOUS | Status: DC

## 2019-12-15 MED ORDER — MIDAZOLAM HCL 2 MG/2ML IJ SOLN
INTRAMUSCULAR | Status: DC | PRN
  Administered 2019-12-15: 2 mg via INTRAVENOUS

## 2019-12-15 MED ORDER — LIDOCAINE 2% (20 MG/ML) 5 ML SYRINGE
INTRAMUSCULAR | Status: DC | PRN
  Administered 2019-12-15: 80 mg via INTRAVENOUS

## 2019-12-15 MED ORDER — METOPROLOL TARTRATE 5 MG/5ML IV SOLN
5.0000 mg | Freq: Three times a day (TID) | INTRAVENOUS | Status: DC | PRN
  Administered 2019-12-15: 5 mg via INTRAVENOUS
  Filled 2019-12-15: qty 5

## 2019-12-15 MED ORDER — THROMBIN 5000 UNITS EX SOLR
CUTANEOUS | Status: AC
  Filled 2019-12-15: qty 5000

## 2019-12-15 MED ORDER — ONDANSETRON HCL 4 MG/2ML IJ SOLN: 4.0000 mg | Freq: Four times a day (QID) | INTRAMUSCULAR | Status: DC | PRN

## 2019-12-15 MED ORDER — MIDAZOLAM HCL 2 MG/2ML IJ SOLN
INTRAMUSCULAR | Status: AC
  Filled 2019-12-15: qty 2

## 2019-12-15 MED ORDER — ONDANSETRON HCL 4 MG/2ML IJ SOLN
INTRAMUSCULAR | Status: AC
  Filled 2019-12-15: qty 2

## 2019-12-15 MED ORDER — HEMOSTATIC AGENTS (NO CHARGE) OPTIME
TOPICAL | Status: DC | PRN
  Administered 2019-12-15 (×2): 1 via TOPICAL

## 2019-12-15 MED ORDER — PROPOFOL 10 MG/ML IV BOLUS
INTRAVENOUS | Status: AC
  Filled 2019-12-15: qty 20

## 2019-12-15 MED ORDER — MORPHINE SULFATE (PF) 2 MG/ML IV SOLN
2.0000 mg | INTRAVENOUS | Status: AC | PRN
  Administered 2019-12-15 – 2019-12-16 (×2): 2 mg via INTRAVENOUS
  Filled 2019-12-15 (×2): qty 1

## 2019-12-15 MED ORDER — ROCURONIUM BROMIDE 10 MG/ML (PF) SYRINGE
PREFILLED_SYRINGE | INTRAVENOUS | Status: AC
  Filled 2019-12-15: qty 10

## 2019-12-15 MED ORDER — FENTANYL CITRATE (PF) 100 MCG/2ML IJ SOLN: 25.0000 ug | INTRAMUSCULAR | Status: DC | PRN

## 2019-12-15 MED ORDER — METOPROLOL TARTRATE 50 MG PO TABS
50.0000 mg | ORAL_TABLET | Freq: Two times a day (BID) | ORAL | Status: DC
  Administered 2019-12-15 – 2019-12-17 (×4): 50 mg via ORAL
  Filled 2019-12-15 (×4): qty 1

## 2019-12-15 MED ORDER — DEXAMETHASONE SODIUM PHOSPHATE 10 MG/ML IJ SOLN
INTRAMUSCULAR | Status: DC | PRN
  Administered 2019-12-15: 5 mg via INTRAVENOUS

## 2019-12-15 MED ORDER — LACTATED RINGERS IV SOLN: INTRAVENOUS | Status: DC

## 2019-12-15 MED ORDER — METHYLPREDNISOLONE ACETATE 80 MG/ML IJ SUSP
INTRAMUSCULAR | Status: AC
  Filled 2019-12-15: qty 1

## 2019-12-15 MED ORDER — ORAL CARE MOUTH RINSE: 15.0000 mL | Freq: Once | OROMUCOSAL | Status: AC

## 2019-12-15 MED ORDER — MICROFIBRILLAR COLL HEMOSTAT EX POWD
CUTANEOUS | Status: DC | PRN
  Administered 2019-12-15: 1 g via TOPICAL

## 2019-12-15 MED ORDER — ONDANSETRON HCL 4 MG/2ML IJ SOLN
INTRAMUSCULAR | Status: DC | PRN
  Administered 2019-12-15: 4 mg via INTRAVENOUS

## 2019-12-15 MED ORDER — HYDRALAZINE HCL 20 MG/ML IJ SOLN: 10.0000 mg | Freq: Four times a day (QID) | INTRAMUSCULAR | Status: DC | PRN

## 2019-12-15 MED ORDER — MICROFIBRILLAR COLL HEMOSTAT EX POWD
CUTANEOUS | Status: AC
  Filled 2019-12-15: qty 5

## 2019-12-15 MED ORDER — THROMBIN 5000 UNITS EX SOLR
CUTANEOUS | Status: DC | PRN
  Administered 2019-12-15 (×2): 5000 [IU] via TOPICAL

## 2019-12-15 MED ORDER — FENTANYL CITRATE (PF) 250 MCG/5ML IJ SOLN
INTRAMUSCULAR | Status: AC
  Filled 2019-12-15: qty 5

## 2019-12-15 MED ORDER — SODIUM CHLORIDE 0.9% FLUSH
3.0000 mL | Freq: Two times a day (BID) | INTRAVENOUS | Status: DC
  Administered 2019-12-15: 3 mL via INTRAVENOUS

## 2019-12-15 MED ORDER — PHENYLEPHRINE HCL (PRESSORS) 10 MG/ML IV SOLN
INTRAVENOUS | Status: DC | PRN
  Administered 2019-12-15: 80 ug via INTRAVENOUS

## 2019-12-15 MED ORDER — MENTHOL 3 MG MT LOZG: 1.0000 | LOZENGE | OROMUCOSAL | Status: DC | PRN

## 2019-12-15 MED ORDER — PHENOL 1.4 % MT LIQD: 1.0000 | OROMUCOSAL | Status: DC | PRN

## 2019-12-15 MED ORDER — DEXAMETHASONE SODIUM PHOSPHATE 10 MG/ML IJ SOLN
INTRAMUSCULAR | Status: AC
  Filled 2019-12-15: qty 1

## 2019-12-15 MED ORDER — CEFAZOLIN SODIUM-DEXTROSE 2-4 GM/100ML-% IV SOLN
2.0000 g | Freq: Three times a day (TID) | INTRAVENOUS | Status: AC
  Administered 2019-12-15 – 2019-12-16 (×2): 2 g via INTRAVENOUS
  Filled 2019-12-15 (×2): qty 100

## 2019-12-15 MED ORDER — ACETAMINOPHEN 650 MG RE SUPP: 650.0000 mg | RECTAL | Status: DC | PRN

## 2019-12-15 MED ORDER — LIDOCAINE 2% (20 MG/ML) 5 ML SYRINGE
INTRAMUSCULAR | Status: AC
  Filled 2019-12-15: qty 5

## 2019-12-15 MED ORDER — PHENYLEPHRINE HCL-NACL 10-0.9 MG/250ML-% IV SOLN
INTRAVENOUS | Status: DC | PRN
  Administered 2019-12-15: 25 ug/min via INTRAVENOUS

## 2019-12-15 MED ORDER — LIDOCAINE-EPINEPHRINE 1 %-1:100000 IJ SOLN
INTRAMUSCULAR | Status: DC | PRN
  Administered 2019-12-15: 15 mL

## 2019-12-15 MED ORDER — KETAMINE HCL 50 MG/ML IJ SOLN
INTRAMUSCULAR | Status: DC | PRN
Start: 2019-12-15 — End: 2019-12-15
  Administered 2019-12-15: 30 mg via INTRAMUSCULAR
  Administered 2019-12-15 (×2): 10 mg via INTRAMUSCULAR

## 2019-12-15 MED ORDER — CEFAZOLIN SODIUM-DEXTROSE 2-3 GM-%(50ML) IV SOLR
INTRAVENOUS | Status: DC | PRN
  Administered 2019-12-15: 2 g via INTRAVENOUS

## 2019-12-15 MED ORDER — 0.9 % SODIUM CHLORIDE (POUR BTL) OPTIME
TOPICAL | Status: DC | PRN
  Administered 2019-12-15: 1000 mL

## 2019-12-15 MED ORDER — LIDOCAINE-EPINEPHRINE 1 %-1:100000 IJ SOLN
INTRAMUSCULAR | Status: AC
  Filled 2019-12-15: qty 1

## 2019-12-15 MED ORDER — SODIUM CHLORIDE 0.9% IV SOLUTION: Freq: Once | INTRAVENOUS | Status: DC

## 2019-12-15 MED ORDER — DEXMEDETOMIDINE (PRECEDEX) IN NS 20 MCG/5ML (4 MCG/ML) IV SYRINGE
PREFILLED_SYRINGE | INTRAVENOUS | Status: AC
  Filled 2019-12-15: qty 5

## 2019-12-15 MED ORDER — SODIUM CHLORIDE 0.9% FLUSH: 3.0000 mL | INTRAVENOUS | Status: DC | PRN

## 2019-12-15 MED ORDER — BUPIVACAINE-EPINEPHRINE 0.5% -1:200000 IJ SOLN
INTRAMUSCULAR | Status: AC
  Filled 2019-12-15: qty 1

## 2019-12-15 MED ORDER — DEXMEDETOMIDINE (PRECEDEX) IN NS 20 MCG/5ML (4 MCG/ML) IV SYRINGE
PREFILLED_SYRINGE | INTRAVENOUS | Status: DC | PRN
  Administered 2019-12-15 (×5): 4 ug via INTRAVENOUS

## 2019-12-15 MED ORDER — BUPIVACAINE HCL (PF) 0.5 % IJ SOLN
INTRAMUSCULAR | Status: DC | PRN
  Administered 2019-12-15: 15 mL

## 2019-12-15 MED ORDER — CEFAZOLIN SODIUM-DEXTROSE 2-4 GM/100ML-% IV SOLN
INTRAVENOUS | Status: AC
  Filled 2019-12-15: qty 100

## 2019-12-15 MED ORDER — KETAMINE HCL 50 MG/5ML IJ SOSY
PREFILLED_SYRINGE | INTRAMUSCULAR | Status: AC
  Filled 2019-12-15: qty 5

## 2019-12-15 MED ORDER — HYDROCODONE-ACETAMINOPHEN 5-325 MG PO TABS
1.0000 | ORAL_TABLET | ORAL | Status: DC | PRN
  Administered 2019-12-15 – 2019-12-17 (×9): 1 via ORAL
  Filled 2019-12-15 (×10): qty 1

## 2019-12-15 MED ORDER — ACETAMINOPHEN 10 MG/ML IV SOLN
INTRAVENOUS | Status: DC | PRN
  Administered 2019-12-15: 1000 mg via INTRAVENOUS

## 2019-12-15 MED ORDER — THROMBIN 5000 UNITS EX SOLR: OROMUCOSAL | Status: DC | PRN

## 2019-12-15 MED ORDER — ONDANSETRON HCL 4 MG PO TABS: 4.0000 mg | ORAL_TABLET | Freq: Four times a day (QID) | ORAL | Status: DC | PRN

## 2019-12-15 MED ORDER — SODIUM CHLORIDE 0.9% IV SOLUTION: Freq: Once | INTRAVENOUS | Status: AC

## 2019-12-15 MED ORDER — ACETAMINOPHEN 325 MG PO TABS: 650.0000 mg | ORAL_TABLET | ORAL | Status: DC | PRN

## 2019-12-15 MED ORDER — KETAMINE HCL 10 MG/ML IJ SOLN
INTRAMUSCULAR | Status: DC | PRN
Start: 2019-12-15 — End: 2019-12-15

## 2019-12-15 MED ORDER — PHENYLEPHRINE 40 MCG/ML (10ML) SYRINGE FOR IV PUSH (FOR BLOOD PRESSURE SUPPORT)
PREFILLED_SYRINGE | INTRAVENOUS | Status: AC
  Filled 2019-12-15: qty 10

## 2019-12-15 MED ORDER — PROPOFOL 10 MG/ML IV BOLUS
INTRAVENOUS | Status: DC | PRN
  Administered 2019-12-15: 150 mg via INTRAVENOUS

## 2019-12-15 MED ORDER — SUGAMMADEX SODIUM 200 MG/2ML IV SOLN
INTRAVENOUS | Status: DC | PRN
  Administered 2019-12-15: 200 mg via INTRAVENOUS

## 2019-12-15 MED ORDER — METHYLPREDNISOLONE ACETATE 80 MG/ML IJ SUSP
INTRAMUSCULAR | Status: DC | PRN
  Administered 2019-12-15: 80 mg

## 2019-12-15 MED ORDER — ROCURONIUM BROMIDE 10 MG/ML (PF) SYRINGE
PREFILLED_SYRINGE | INTRAVENOUS | Status: DC | PRN
  Administered 2019-12-15 (×2): 10 mg via INTRAVENOUS
  Administered 2019-12-15: 60 mg via INTRAVENOUS

## 2019-12-15 SURGICAL SUPPLY — 42 items
BAND RUBBER #18 3X1/16 STRL (MISCELLANEOUS) ×6 IMPLANT
BLADE SURG 11 STRL SS (BLADE) ×3 IMPLANT
BUR CARBIDE MATCH 3.0 (BURR) ×3 IMPLANT
CANISTER SUCT 3000ML PPV (MISCELLANEOUS) ×3 IMPLANT
CARTRIDGE OIL MAESTRO DRILL (MISCELLANEOUS) ×1 IMPLANT
DECANTER SPIKE VIAL GLASS SM (MISCELLANEOUS) ×3 IMPLANT
DERMABOND ADVANCED (GAUZE/BANDAGES/DRESSINGS)
DERMABOND ADVANCED .7 DNX12 (GAUZE/BANDAGES/DRESSINGS) IMPLANT
DIFFUSER DRILL AIR PNEUMATIC (MISCELLANEOUS) ×3 IMPLANT
DRAPE LAPAROTOMY 100X72X124 (DRAPES) ×3 IMPLANT
DRAPE MICROSCOPE LEICA 54X105 (DRAPES) ×3 IMPLANT
DRAPE SURG 17X23 STRL (DRAPES) ×3 IMPLANT
DRSG PAD ABDOMINAL 8X10 ST (GAUZE/BANDAGES/DRESSINGS) ×3 IMPLANT
DURAPREP 26ML APPLICATOR (WOUND CARE) ×3 IMPLANT
ELECT REM PT RETURN 9FT ADLT (ELECTROSURGICAL) ×3
ELECTRODE REM PT RTRN 9FT ADLT (ELECTROSURGICAL) ×1 IMPLANT
GLOVE BIOGEL PI IND STRL 6.5 (GLOVE) ×5 IMPLANT
GLOVE BIOGEL PI IND STRL 8 (GLOVE) ×3 IMPLANT
GLOVE BIOGEL PI INDICATOR 6.5 (GLOVE) ×10
GLOVE BIOGEL PI INDICATOR 8 (GLOVE) ×6
GLOVE SS N UNI LF 7.0 STRL (GLOVE) ×9 IMPLANT
GLOVE SS N UNI LF 8.0 STRL (GLOVE) ×3 IMPLANT
GOWN STRL REUS W/ TWL LRG LVL3 (GOWN DISPOSABLE) ×2 IMPLANT
GOWN STRL REUS W/ TWL XL LVL3 (GOWN DISPOSABLE) ×2 IMPLANT
GOWN STRL REUS W/TWL 2XL LVL3 (GOWN DISPOSABLE) IMPLANT
GOWN STRL REUS W/TWL LRG LVL3 (GOWN DISPOSABLE) ×4
GOWN STRL REUS W/TWL XL LVL3 (GOWN DISPOSABLE) ×4
HEMOSTAT POWDER KIT SURGIFOAM (HEMOSTASIS) ×3 IMPLANT
KIT BASIN OR (CUSTOM PROCEDURE TRAY) ×3 IMPLANT
KIT TURNOVER KIT B (KITS) ×3 IMPLANT
NS IRRIG 1000ML POUR BTL (IV SOLUTION) ×3 IMPLANT
OIL CARTRIDGE MAESTRO DRILL (MISCELLANEOUS) ×3
PACK LAMINECTOMY NEURO (CUSTOM PROCEDURE TRAY) ×3 IMPLANT
SPONGE SURGIFOAM ABS GEL SZ50 (HEMOSTASIS) ×3 IMPLANT
STAPLER VISISTAT 35W (STAPLE) ×3 IMPLANT
SUT VIC AB 0 CT1 18XCR BRD8 (SUTURE) ×1 IMPLANT
SUT VIC AB 0 CT1 8-18 (SUTURE) ×2
SUT VIC AB 2-0 CP2 18 (SUTURE) ×6 IMPLANT
TAPE PAPER 2X10 WHT MICROPORE (GAUZE/BANDAGES/DRESSINGS) ×3 IMPLANT
TOWEL GREEN STERILE (TOWEL DISPOSABLE) ×3 IMPLANT
TOWEL GREEN STERILE FF (TOWEL DISPOSABLE) ×3 IMPLANT
WATER STERILE IRR 1000ML POUR (IV SOLUTION) ×3 IMPLANT

## 2019-12-15 NOTE — Progress Notes (Signed)
This note also relates to the following rows which could not be included: ECG Heart Rate - Cannot attach notes to unvalidated device data   

## 2019-12-15 NOTE — Plan of Care (Signed)
  Problem: Education: Goal: Knowledge of disease or condition will improve Outcome: Progressing Goal: Understanding of discharge needs will improve Outcome: Progressing   Problem: Health Behavior/Discharge Planning: Goal: Ability to identify changes in lifestyle to reduce recurrence of condition will improve Outcome: Progressing Goal: Identification of resources available to assist in meeting health care needs will improve Outcome: Progressing   Problem: Physical Regulation: Goal: Complications related to the disease process, condition or treatment will be avoided or minimized Outcome: Progressing   Problem: Safety: Goal: Ability to remain free from injury will improve Outcome: Progressing   Problem: Clinical Measurements: Goal: Ability to maintain clinical measurements within normal limits will improve Outcome: Progressing Goal: Will remain free from infection Outcome: Progressing Goal: Diagnostic test results will improve Outcome: Progressing Goal: Respiratory complications will improve Outcome: Progressing Goal: Cardiovascular complication will be avoided Outcome: Progressing   Problem: Nutrition: Goal: Adequate nutrition will be maintained Outcome: Progressing   Problem: Coping: Goal: Level of anxiety will decrease Outcome: Progressing   Problem: Pain Managment: Goal: General experience of comfort will improve Outcome: Progressing   

## 2019-12-15 NOTE — Anesthesia Preprocedure Evaluation (Addendum)
Anesthesia Evaluation  Patient identified by MRN, date of birth, ID band Patient awake    Reviewed: Allergy & Precautions, NPO status , Patient's Chart, lab work & pertinent test results  Airway Mallampati: II  TM Distance: >3 FB Neck ROM: Limited    Dental  (+) Teeth Intact, Dental Advisory Given   Pulmonary former smoker,    Pulmonary exam normal breath sounds clear to auscultation       Cardiovascular hypertension, Pt. on medications Normal cardiovascular exam Rhythm:Regular Rate:Normal     Neuro/Psych Lumbar stenosis with BLE paresthesias    GI/Hepatic GERD  Medicated,(+) Cirrhosis     substance abuse  alcohol use, HCV s/p treatment   Endo/Other  diabetes, Type 2, Oral Hypoglycemic AgentsObesity   Renal/GU Renal disease     Musculoskeletal negative musculoskeletal ROS (+)   Abdominal   Peds  Hematology  (+) Blood dyscrasia (Thrombocytopenia), anemia ,   Anesthesia Other Findings Day of surgery medications reviewed with the patient.  Reproductive/Obstetrics                             Anesthesia Physical Anesthesia Plan  ASA: III  Anesthesia Plan: General   Post-op Pain Management:    Induction: Intravenous  PONV Risk Score and Plan: 3 and Midazolam, Dexamethasone and Ondansetron  Airway Management Planned: Oral ETT and Video Laryngoscope Planned  Additional Equipment:   Intra-op Plan:   Post-operative Plan: Extubation in OR  Informed Consent: I have reviewed the patients History and Physical, chart, labs and discussed the procedure including the risks, benefits and alternatives for the proposed anesthesia with the patient or authorized representative who has indicated his/her understanding and acceptance.   Patient has DNR.  Discussed DNR with patient and Suspend DNR.   Dental advisory given  Plan Discussed with: CRNA  Anesthesia Plan Comments:         Anesthesia Quick Evaluation

## 2019-12-15 NOTE — Progress Notes (Signed)
OT Cancellation Note  Patient Details Name: Connor Vaughn MRN: [id number] DOB: 01/01/1961   Cancelled Treatment:    Reason Eval/Treat Not Completed: Patient at procedure or test/ unavailable (off floor for sx)  [name] 12/15/2019, 8:15 AM   [name] OTR/L Acute Rehabilitation Services Pager: [phone] Office: [phone]

## 2019-12-15 NOTE — Progress Notes (Signed)
Saw patient at bedside.  Getting platelets at this time.  Ambulated well with improved strength in his left lower extremity.  Notes his sensation in his lower extremities and buttocks is improved and less tingling.  He has full strength in bilateral lower extremities.  He states he feels much improved.

## 2019-12-15 NOTE — Transfer of Care (Signed)
Immediate Anesthesia Transfer of Care Note  Patient: Connor Vaughn  Procedure(s) Performed: LUMBAR THREE-SACRAL ONE LAMINECTOMY WITH LUMBAR FOUR-FIVE DISCECTOMY  (N/A Spine Lumbar)  Patient Location: PACU  Anesthesia Type:General  Level of Consciousness: drowsy  Airway & Oxygen Therapy: Patient Spontanous Breathing  Post-op Assessment: Report given to RN and Post -op Vital signs reviewed and stable  Post vital signs: Reviewed and stable  Last Vitals:  Vitals Value Taken Time  BP 147/89 12/15/19 1142  Temp    Pulse 113 12/15/19 1144  Resp 12 12/15/19 1144  SpO2 95 % 12/15/19 1144  Vitals shown include unvalidated device data.  Last Pain:  Vitals:   12/15/19 0740  TempSrc:   PainSc: 6       Patients Stated Pain Goal: 0 (12/14/19 2309)  Complications: No complications documented.

## 2019-12-15 NOTE — Progress Notes (Signed)
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 We will transfuse PLT preop and monitor it closely and proceed with surgery, patient is agreeable and understands the risks benefits discussed. I offered that we could also delay the surgery to tomorrow or Tuesday and transfuse him platelets however it does not change the fact of his chronic liver disease and likely need for PLT transfusion.    Monia Pouch, DO Neurosurgeon [facility] Cell: [phone]

## 2019-12-15 NOTE — Progress Notes (Signed)
PROGRESS NOTE                                                                                                                                                                                                             Patient Demographics:    Connor MoulderScott Vaughn, is a 59 y.o. male, DOB - Nov 13, 1960, EAV:[id number]  Admit date - 12/11/2019   Admitting Physician Leroy SeaPrashant K Abdelaziz Westenberger, MD  Outpatient Primary MD for the patient is [name], Connor J., MD  LOS - 3  Chief Complaint  Patient presents with   Hypotension   Loss of Consciousness       Brief Narrative - This is a 59 year old male with history of alcohol abuse, HCV s/p treatment, frequent syncopal episodes, type 2 diabetes, depression/anxiety, cirrhosis, chronic back pain, hypertension and was recently started on losartan who presented to the ED on 7/21 from home following multiple episodes of syncope within a 30-minute time span   Subjective:   Patient in bed, appears comfortable, denies any headache, no fever, no chest pain or pressure, no shortness of breath , no abdominal pain.  Continues to have low back pain, no focal weakness at rest.  Going to the OR.   Assessment  & Plan :     1.  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  Does have mild left leg weakness at times upon exertion, so seems to have C6-C7 disc protrusion with some stenosis, TSH stable, B12 low normal, going PT OT, seen by neurosurgeon and he underwent L3-S1 open lami with left L4-5 discectomy on 12/15/2019.  Continue to monitor progress.  2.  Loss of consciousness.  This was after mechanical fall when he hit his head on the floor.  No seizure-like activity, no preceding symptoms, Head CT nonacute, no headache or focal deficits.  Echocardiogram shows preserved EF of 60% with mild AS, PT OT and  monitor.  3.  Back pain.  Chronic.  Supportive care and as in #1 above.  4.  AKI on CKD 2.  Baseline creatinine around 1.3, likely due to dehydration from home dose diuretics.  Offending medications held, hydrate and monitor.  AKI has resolved.  5.  Alcohol use.  Patient had alcohol levels upon presentation however on detailed interview he says he only drinks once or twice every 2 weeks.  Will monitor.  If he withdraws then clearly  he is not stating the truth and will treat appropriately.  6.  History of alcoholic and hep C cirrhosis - Hep C  has been treated, now claims to have quit alcohol for several months, has chronic thrombocytopenia, outpatient GI follow-up.  Have communicated to neurosurgeon to transfuse platelets on 12/15/2019 prior to surgery.  Platelet count this morning around 65,000.  7. Dehydration and hypotension.  Stable echocardiogram, blood pressure and orthostatics have resolved after IV fluids and TED stockings.  8. DM type II.  Sliding scale.  Lab Results  Component Value Date   HGBA1C 6.6 (H) 12/12/2019   CBG (last 3)  Recent Labs    12/14/19 2105 12/15/19 0720 12/15/19 1143  GLUCAP 141* 168* 143*     Condition - Extremely Guarded  Family Communication  : Called wife Connor Vaughn on listed cell phone number [phone] no response switched off, called work  [phone] on 12/13/2019 at 10 AM again no response.  Code Status : Full Code  Consults  : NS   Procedures  :    TTE -  1. Technically difficult study, very limited views. Left ventricular ejection fraction, by estimation, is 60 to 65%. The left ventricle has grossly normal function. Left ventricular endocardial border not optimally defined to evaluate regional wall motion. Left ventricular diastolic function could not be evaluated.  2. Right ventricule is poorly visualized but appears systolic function is grossly normal. The right ventricular size appears mildly enlarged.  3. The mitral valve is normal in  structure. No evidence of mitral valve regurgitation.  4. The aortic valve was not well visualized. Aortic valve regurgitation is not visualized. Aortic stenosis evaluation was limited given technically difficult study, but suspect mild AS (MG , Vmax 2.1 m/s).  MRI C Spine -  Cervical spondylosis superimposed upon a congenitally narrow cervical spinal canal, as outlined and most notably as follows. At C6-C7, a left center disc protrusion contributes to moderate spinal canal stenosis to the left. Additionally, the disc protrusion contacts and mildly flattens the left ventral aspect of the spinal cord. Additionally, the disc protrusion has the potential to affect the exiting left C7 nerve root within the left foraminal entry zone. No more than mild spinal canal stenosis at the remaining levels. Additional sites of neural foraminal narrowing as described and greatest bilaterally at C4-C5 (moderate/severe right, moderate left). Also of note, there is moderate right neural foraminal narrowing at C3-C4.  MRI L Spine - Mild spinal stenosis L2-3.  Moderate spinal stenosis L3-4 Severe spinal stenosis L4-5 with severe subarticular stenosis bilaterally. Large central disc protrusion Disc degeneration and prominent spurring L5-S1 with superimposed central disc protrusion. Moderate spinal stenosis and moderate subarticular and foraminal stenosis bilaterally L5-S1.   S/p L3-S1 open lami with left L4-5 discectomy 12/15/2019   PUD Prophylaxis : PPI  Disposition Plan  :    Status is: Inpatient  Remains inpatient appropriate because:Ongoing diagnostic testing needed not appropriate for outpatient work up and Unsafe d/c plan   Dispo: The patient is from: Home              Anticipated d/c is to: Home              Anticipated d/c date is: 3 days              Patient currently is not medically stable to d/c.   DVT Prophylaxis  :  Lovenox    Lab Results  Component Value Date   PLT 65 (L) 12/15/2019  Diet :  Diet Order            DIET SOFT Room service appropriate? Yes; Fluid consistency: Thin  Diet effective now                  Inpatient Medications Scheduled Meds:  sodium chloride   Intravenous Once   busPIRone  10 mg Oral TID   Chlorhexidine Gluconate Cloth  6 each Topical Daily   DULoxetine  60 mg Oral Daily   folic acid  1 mg Oral Daily   gabapentin  100 mg Oral TID   insulin aspart  0-9 Units Subcutaneous TID WC   metoprolol tartrate  50 mg Oral BID   multivitamin with minerals  1 tablet Oral Daily   pantoprazole  40 mg Oral Daily   sodium chloride flush  3 mL Intravenous Q12H   thiamine  100 mg Oral Daily   Or   thiamine  100 mg Intravenous Daily   Continuous Infusions:  sodium chloride     ceFAZolin      ceFAZolin (ANCEF) IV     methocarbamol (ROBAXIN) IV     PRN Meds:.acetaminophen **OR** acetaminophen, hydrALAZINE, HYDROcodone-acetaminophen, menthol-cetylpyridinium **OR** phenol, methocarbamol (ROBAXIN) IV, metoprolol tartrate, morphine injection, ondansetron **OR** ondansetron (ZOFRAN) IV, senna-docusate, sodium chloride flush  Antibiotics  :   Anti-infectives (From admission, onward)   Start     Dose/Rate Route Frequency Ordered Stop   12/15/19 1600  ceFAZolin (ANCEF) IVPB 2g/100 mL premix     Discontinue     2 g 200 mL/hr over 30 Minutes Intravenous Every 8 hours 12/15/19 1304 12/16/19 0759   12/15/19 0733  ceFAZolin (ANCEF) 2-4 GM/100ML-% IVPB       Note to Pharmacy: Marcelle Smiling Ch: cabinet override      12/15/19 0733 12/15/19 1944          Objective:   Vitals:   12/15/19 1205 12/15/19 1210 12/15/19 1225 12/15/19 1300  BP: 104/77 (!) 121/86 127/82 (!) 130/94  Pulse: (!) 116 (!) 115 97 (!) 117  Resp: 16 14 (!) 9 14  Temp:   98.4 F (36.9 C) 97.8 F (36.6 C)  TempSrc:    Oral  SpO2: 92% 91% 96%   Weight:      Height:        SpO2: 96 %  Wt Readings from Last 3 Encounters:  12/15/19 (!) 95.3 kg  04/16/19  95.9 kg  05/12/18 86.2 kg     Intake/Output Summary (Last 24 hours) at 12/15/2019 1351 Last data filed at 12/15/2019 1331 Gross per 24 hour  Intake 1926 ml  Output 2625 ml  Net -699 ml     Physical Exam  Awake Alert, No new F.N deficits, Normal affect [place], small bruise midline on the nasal bridge, pupils equal in size and reactive to light Supple Neck,No JVD, No cervical lymphadenopathy appriciated.  Symmetrical Chest wall movement, Good air movement bilaterally, CTAB RRR,No Gallops,Rubs or new Murmurs, No Parasternal Heave +ve B.Sounds, Abd Soft, No tenderness, No organomegaly appriciated, No rebound - guarding or rigidity.  L.Spine JP drain in place. No Cyanosis, Clubbing or edema, No new Rash or bruise      Data Review:    Recent Labs  Lab 12/11/19 2222 12/12/19 0246 12/14/19 0848 12/14/19 1301 12/15/19 0427  WBC 9.7 8.1 2.8* 2.9* 3.1*  HGB 14.0 13.2 12.5* 12.7* 12.7*  HCT 42.2 40.3 38.5* 38.7* 39.0  PLT 167 128* 63* 62* 65*  MCV 85.4 85.9 84.4  84.5 85.3  MCH 28.3 28.1 27.4 27.7 27.8  MCHC 33.2 32.8 32.5 32.8 32.6  RDW 16.5* 16.7* 15.6* 15.6* 15.6*  LYMPHSABS 2.1  --  0.7  --  0.8  MONOABS 1.1*  --  0.4  --  0.5  EOSABS 0.1  --  0.1  --  0.1  BASOSABS 0.1  --  0.0  --  0.0    Recent Labs  Lab 12/11/19 2222 12/11/19 2223 12/12/19 0246 12/12/19 1236 12/13/19 1104 12/14/19 0848 12/14/19 1301 12/15/19 0427  NA  --  139 139  --   --  135  --  136  K  --  3.7 3.9  --   --  4.1  --  3.9  CL  --  98 101  --   --  103  --  102  CO2  --  26 26  --   --  26  --  27  GLUCOSE  --  175* 176*  --   --  191*  --  150*  BUN  --  21* 20  --   --  11  --  13  CREATININE  --  2.89* 2.19*  --   --  1.04  --  1.10  CALCIUM  --  8.3* 8.5*  --   --  9.1  --  9.0  AST  --  43* 40  --   --  31  --  31  ALT  --  31 29  --   --  26  --  25  ALKPHOS  --  79 79  --   --  73  --  76  BILITOT  --  1.1 0.3  --   --  1.1  --  0.9  ALBUMIN  --  3.6 3.5  --   --  3.3*  --  3.2*  MG   --  2.2 1.9  --   --  1.8  --  1.8  CRP  --   --   --   --   --  0.6  --  0.8  INR 1.1  --   --   --   --   --  1.1  --   TSH  --   --   --   --  3.019  --   --   --   HGBA1C  --   --   --  6.6*  --   --   --   --   BNP  --   --   --   --   --  79.7  --  37.6    Recent Labs  Lab 12/12/19 0100 12/14/19 0848 12/15/19 0427  CRP  --  0.6 0.8  BNP  --  79.7 37.6  SARSCOV2NAA NEGATIVE  --   --     ------------------------------------------------------------------------------------------------------------------ No results for input(s): CHOL, HDL, LDLCALC, TRIG, CHOLHDL, LDLDIRECT in the last 72 hours.  Lab Results  Component Value Date   HGBA1C 6.6 (H) 12/12/2019   ------------------------------------------------------------------------------------------------------------------ Recent Labs    12/13/19 1104  TSH 3.019   ------------------------------------------------------------------------------------------------------------------ Recent Labs    12/13/19 1104  VITAMINB12 349  FOLATE 35.1  FERRITIN 54  TIBC 430  IRON 313*  RETICCTPCT 0.9    Coagulation profile Recent Labs  Lab 12/11/19 2222 12/14/19 1301  INR 1.1 1.1    No results for input(s): DDIMER in the last 72 hours.  Cardiac Enzymes No  results for input(s): CKMB, TROPONINI, MYOGLOBIN in the last 168 hours.  Invalid input(s): CK ------------------------------------------------------------------------------------------------------------------    Component Value Date/Time   BNP 37.6 12/15/2019 0427    Micro Results Recent Results (from the past 240 hour(s))  SARS Coronavirus 2 by RT PCR (hospital order, performed in [facility] hospital lab) Nasopharyngeal Nasopharyngeal Swab     Status: None   Collection Time: 12/12/19  1:00 AM   Specimen: Nasopharyngeal Swab  Result Value Ref Range Status   SARS Coronavirus 2 NEGATIVE NEGATIVE Final    Comment: (NOTE) SARS-CoV-2 target nucleic acids are NOT  DETECTED.  The SARS-CoV-2 RNA is generally detectable in upper and lower respiratory specimens during the acute phase of infection. The lowest concentration of SARS-CoV-2 viral copies this assay can detect is 250 copies / mL. A negative result does not preclude SARS-CoV-2 infection and should not be used as the sole basis for treatment or other patient management decisions.  A negative result may occur with improper specimen collection / handling, submission of specimen other than nasopharyngeal swab, presence of viral mutation(s) within the areas targeted by this assay, and inadequate number of viral copies (<250 copies / mL). A negative result must be combined with clinical observations, patient history, and epidemiological information.  Fact Sheet for Patients:   BoilerBrush.com.cy  Fact Sheet for Healthcare Providers: https://pope.com/  This test is not yet approved or  cleared by the [place] FDA and has been authorized for detection and/or diagnosis of SARS-CoV-2 by FDA under an Emergency Use Authorization (EUA).  This EUA will remain in effect (meaning this test can be used) for the duration of the COVID-19 declaration under Section 564(b)(1) of the Act, 21 U.S.C. section 360bbb-3(b)(1), unless the authorization is terminated or revoked sooner.  Performed at [facility], [address], [place], [place] [address]   Surgical pcr screen     Status: None   Collection Time: 12/14/19  5:41 PM   Specimen: Nasal Mucosa; Nasal Swab  Result Value Ref Range Status   MRSA, PCR NEGATIVE NEGATIVE Final   Staphylococcus aureus NEGATIVE NEGATIVE Final    Comment: (NOTE) The Xpert SA Assay (FDA approved for NASAL specimens in patients 36 years of age and older), is one component of a comprehensive surveillance program. It is not intended to diagnose infection nor to guide or monitor treatment. Performed at [facility], [address], [place], [place] [address]     Radiology Reports X-ray chest PA and lateral  Result Date: 12/12/2019 CLINICAL DATA:  Syncope, orthostatic hypotension EXAM: CHEST - 2 VIEW COMPARISON:  12/03/2019 FINDINGS: Frontal and lateral views of the chest demonstrate an unremarkable cardiac silhouette. No airspace disease, effusion, or pneumothorax. No acute bony abnormalities. IMPRESSION: 1. No acute intrathoracic process. Electronically Signed   By: Sharlet Salina M.D.   On: 12/12/2019 02:01   CT HEAD WO CONTRAST  Result Date: 12/12/2019 CLINICAL DATA:  Syncope. EXAM: CT HEAD WITHOUT CONTRAST TECHNIQUE: Contiguous axial images were obtained from the base of the skull through the vertex without intravenous contrast. COMPARISON:  04/04/2019 FINDINGS: Brain: There is no evidence for acute hemorrhage, hydrocephalus, mass lesion, or abnormal extra-axial fluid collection. No definite CT evidence for acute infarction. Vascular: No hyperdense vessel or unexpected calcification. Skull: No evidence for fracture. No worrisome lytic or sclerotic lesion. Sinuses/Orbits: The visualized paranasal sinuses and mastoid air cells are clear. Visualized portions of the globes and intraorbital fat are unremarkable. Other: None. IMPRESSION: Unremarkable exam.  No acute intracranial abnormality. Electronically Signed   By: Kennith Center M.D.   On: 12/12/2019 12:29   MR CERVICAL SPINE WO CONTRAST  Result Date: 12/13/2019 CLINICAL DATA:  Myelopathy, acute or progressive. Additional history provided by scanning technologist: Patient reports neck pain intermittently for 2 years with intermittent numbness and tingling in hands, patient works as a Curator. EXAM: MRI CERVICAL SPINE WITHOUT CONTRAST TECHNIQUE: Multiplanar, multisequence MR imaging of the cervical spine was performed. No intravenous contrast was administered. COMPARISON:  CT cervical spine 05/12/2018 FINDINGS: Alignment: Reversal of the expected  cervical lordosis. Minimal C3-C4 grade 1 anterolisthesis. Vertebrae: No significant osseous lesion or significant marrow edema. Cord: No definite spinal cord signal abnormality is identified. Posterior Fossa, vertebral arteries, paraspinal tissues: No abnormality identified within included portions of the posterior fossa. Flow voids preserved within the imaged cervical vertebral arteries. STIR hyperintensity within the C4-C5 interspinous space appears vascular in nature. Paraspinal soft tissues otherwise unremarkable. Disc levels: Mild diffuse disc degeneration throughout the cervical spine. Congenitally narrow cervical spinal canal. C2-C3: Shallow disc bulge. Right-sided uncinate hypertrophy. Facet hypertrophy. Mild relative spinal canal narrowing. Mild right neural foraminal narrowing. C3-C4: Minimal grade 1 retrolisthesis. Disc uncovering with disc bulge. Bilateral disc osteophyte ridge/uncinate hypertrophy. Facet hypertrophy. Mild/moderate spinal canal stenosis. Moderate right neural foraminal narrowing. C4-C5: Disc bulge. Uncinate/facet hypertrophy. Mild/moderate spinal canal stenosis. Bilateral neural foraminal narrowing (moderate/severe right, moderate left). C5-C6: Shallow central disc protrusion. Facet hypertrophy. Mild spinal canal stenosis. No significant foraminal narrowing. C6-C7: Disc bulge. Superimposed left center disc protrusion. Facet hypertrophy. There is moderate spinal canal stenosis to the left. The disc protrusion contacts and mildly flattens the left ventral aspect of the spinal cord. Additionally, the disc protrusion has the potential to affect the exiting left C7 nerve root within the left foraminal entry zone. C7-T1: Facet and ligamentum flavum hypertrophy. No significant disc herniation or stenosis. IMPRESSION: Cervical spondylosis superimposed upon a congenitally narrow cervical spinal canal, as outlined and most notably as follows. At C6-C7, a left center disc protrusion contributes to  moderate spinal canal stenosis to the left. Additionally, the disc protrusion contacts and mildly flattens the left ventral aspect of the spinal cord. Additionally, the disc protrusion has the potential to affect the exiting left C7 nerve root within the left foraminal entry zone. No more than mild spinal canal stenosis at the remaining levels. Additional sites of neural foraminal narrowing as described and greatest bilaterally at C4-C5 (moderate/severe right, moderate left). Also of note, there is moderate right neural foraminal narrowing at C3-C4. Electronically Signed   By: Jackey Loge DO   On: 12/13/2019 14:59   MR LUMBAR SPINE WO CONTRAST  Result Date: 12/12/2019 CLINICAL DATA:  Low back pain with progressive neurologic deficit. Fall. EXAM: MRI LUMBAR SPINE WITHOUT CONTRAST TECHNIQUE: Multiplanar, multisequence MR imaging of the lumbar spine was performed. No intravenous contrast was administered. COMPARISON:  Lumbar radiographs 12/03/2019 FINDINGS: Segmentation:  Normal Alignment:  Slight retrolisthesis L1-2, L2-3, L3-4, L4-5, L5-S1 Vertebrae:  Normal bone marrow.  Negative for fracture or mass. Conus medullaris and cauda equina: Conus extends to the L1-2 level. Conus and cauda equina appear normal. Paraspinal and other soft tissues: Negative for paraspinous mass or adenopathy. Disc levels: L1-2: Mild disc degeneration and disc bulging. Left lateral osteophyte formation. Negative for spinal or foraminal stenosis L2-3: Mild disc degeneration and disc bulging. Mild facet hypertrophy bilaterally. Mild spinal stenosis and mild subarticular stenosis bilaterally L3-4: Disc degeneration with diffuse disc bulging and endplate spurring. Shallow central disc  protrusion. Mild facet hypertrophy bilaterally. Moderate spinal stenosis and moderate subarticular stenosis bilaterally L4-5: Disc degeneration with diffuse disc bulging and endplate spurring. Large central disc protrusion with downgoing disc material causing  compression of thecal sac and severe spinal stenosis. Moderate facet and ligamentum flavum hypertrophy contributes to stenosis. There is severe subarticular stenosis bilaterally L5-S1: Disc degeneration with prominent diffuse endplate osteophyte formation. Superimposed central disc protrusion with downgoing disc material. Moderate spinal stenosis and moderate subarticular and foraminal stenosis bilaterally. Bilateral facet hypertrophy. IMPRESSION: Mild spinal stenosis L2-3.  Moderate spinal stenosis L3-4 Severe spinal stenosis L4-5 with severe subarticular stenosis bilaterally. Large central disc protrusion Disc degeneration and prominent spurring L5-S1 with superimposed central disc protrusion. Moderate spinal stenosis and moderate subarticular and foraminal stenosis bilaterally L5-S1. Electronically Signed   By: Marlan Palau M.D.   On: 12/12/2019 11:24   DG Lumbar Spine 1 View  Result Date: 12/15/2019 CLINICAL DATA:  L3-S1 laminectomy with L4-5 discectomy. EXAM: DG C-ARM 1-60 MIN; LUMBAR SPINE - 1 VIEW CONTRAST:  None. FLUOROSCOPY TIME:  Fluoroscopy Time:  0 minutes 2 seconds Radiation Exposure Index (if provided by the fluoroscopic device): 2.07 mGy Number of Acquired Spot Images: 1 COMPARISON:  MRI 12/12/2019 FINDINGS: Vertebral body alignment and heights are normal. Mild spondylosis of the lumbar spine. Mild disc space narrowing at the L4-5 and L5-S1 levels. Surgical instrument is present over the posterior soft tissues at the L4 level between the L3 and L4 spinous processes. IMPRESSION: Surgical instrumentation localization as described. Electronically Signed   By: Elberta Fortis M.D.   On: 12/15/2019 12:51   DG C-Arm 1-60 Min  Result Date: 12/15/2019 CLINICAL DATA:  L3-S1 laminectomy with L4-5 discectomy. EXAM: DG C-ARM 1-60 MIN; LUMBAR SPINE - 1 VIEW CONTRAST:  None. FLUOROSCOPY TIME:  Fluoroscopy Time:  0 minutes 2 seconds Radiation Exposure Index (if provided by the fluoroscopic device): 2.07 mGy  Number of Acquired Spot Images: 1 COMPARISON:  MRI 12/12/2019 FINDINGS: Vertebral body alignment and heights are normal. Mild spondylosis of the lumbar spine. Mild disc space narrowing at the L4-5 and L5-S1 levels. Surgical instrument is present over the posterior soft tissues at the L4 level between the L3 and L4 spinous processes. IMPRESSION: Surgical instrumentation localization as described. Electronically Signed   By: Elberta Fortis M.D.   On: 12/15/2019 12:51   ECHOCARDIOGRAM COMPLETE  Result Date: 12/12/2019    ECHOCARDIOGRAM REPORT   Patient Name:   JEMEL ONO Date of Exam: 12/12/2019 Medical Rec #:  [id number]          Height:       66.0 in Accession #:    [id number]         Weight:       211.5 lb Date of Birth:  May 14, 1961          BSA:          2.048 m Patient Age:    59 years           BP:           150/98 mmHg Patient Gender: M                  HR:           96 bpm. Exam Location:  Inpatient Procedure: 2D Echo Indications:    syncope  History:        Patient has prior history of Echocardiogram examinations, most  recent 12/25/2015. Risk Factors:Diabetes and Former Smoker.  Sonographer:    Celene Skeen RDCS (AE) Referring Phys: [id number] DAVID MANUEL ORTIZ  Sonographer Comments: No parasternal window and Technically difficult study due to poor echo windows. Image acquisition challenging due to patient body habitus and Image acquisition challenging due to respiratory motion. IMPRESSIONS  1. Technically difficult study, very limited views. Left ventricular ejection fraction, by estimation, is 60 to 65%. The left ventricle has grossly normal function. Left ventricular endocardial border not optimally defined to evaluate regional wall motion. Left ventricular diastolic function could not be evaluated.  2. Right ventricule is poorly visualized but appears systolic function is grossly normal. The right ventricular size appears mildly enlarged.  3. The mitral valve is normal in structure.  No evidence of mitral valve regurgitation.  4. The aortic valve was not well visualized. Aortic valve regurgitation is not visualized. Aortic stenosis evaluation was limited given technically difficult study, but suspect mild AS (MG , Vmax 2.1 m/s) FINDINGS  Left Ventricle: Left ventricular ejection fraction, by estimation, is 60 to 65%. The left ventricle has normal function. Left ventricular endocardial border not optimally defined to evaluate regional wall motion. The left ventricular internal cavity size was normal in size. There is mild left ventricular hypertrophy. Left ventricular diastolic function could not be evaluated. Right Ventricle: The right ventricular size is mildly enlarged. Right vetricular wall thickness was not assessed. Right ventricular systolic function is normal. Left Atrium: Left atrial size was not well visualized. Right Atrium: Right atrial size was not well visualized. Pericardium: Trivial pericardial effusion is present. Mitral Valve: The mitral valve is normal in structure. No evidence of mitral valve regurgitation. Tricuspid Valve: The tricuspid valve is grossly normal. Tricuspid valve regurgitation is not demonstrated. Aortic Valve: The aortic valve was not well visualized. Aortic valve regurgitation is not visualized. Mild aortic stenosis is present. Aortic valve mean gradient measures 10.0 mmHg. Aortic valve peak gradient measures 16.8 mmHg. Pulmonic Valve: The pulmonic valve was not well visualized. Pulmonic valve regurgitation is not visualized. Aorta: The aortic root was not well visualized. IAS/Shunts: The interatrial septum was not well visualized.   Diastology LV e' lateral: 9.46 cm/s  AORTIC VALVE AV Vmax:           205.00 cm/s AV Vmean:          155.000 cm/s AV VTI:            0.343 m AV Peak Grad:      16.8 mmHg AV Mean Grad:      10.0 mmHg LVOT Vmax:         80.00 cm/s LVOT Vmean:        48.200 cm/s LVOT VTI:          0.119 m LVOT/AV VTI ratio: 0.35  SHUNTS Systemic  VTI: 0.12 m Epifanio Lesches MD Electronically signed by Epifanio Lesches MD Signature Date/Time: 12/12/2019/6:54:35 PM    Final     Time Spent in minutes  30   Susa Raring M.D on 12/15/2019 at 1:51 PM  To page go to [email or web address] - password [facility]

## 2019-12-15 NOTE — Progress Notes (Signed)
   Providing Compassionate, Quality Care - Together  NEUROSURGERY PROGRESS NOTE    S: patient seen in PACU s/p lami, states his legs feel improved  O: EXAM:  BP (!) 147/89 (BP Location: Left Arm)   Pulse (!) 108   Temp (!) 97.3 F (36.3 C)   Resp 15   Ht 5\' 6"  (1.676 m)   Wt (!) 95.3 kg   SpO2 95%   BMI 33.89 kg/m   Awake, alert Speech fluent, appropriate  CN grossly intact  5/5 BUE/BLE  Incision c/d/i hmv in place, foley in place  ASSESSMENT:  59 y.o. 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  Please do not hesitate to call with questions or concerns.   12/17/2019, DO Neurosurgeon [facility] Cell: [phone]

## 2019-12-15 NOTE — Anesthesia Postprocedure Evaluation (Signed)
Anesthesia Post Note  Patient: Connor Vaughn  Procedure(s) Performed: LUMBAR THREE-SACRAL ONE LAMINECTOMY WITH LUMBAR FOUR-FIVE DISCECTOMY  (N/A Spine Lumbar)     Patient location during evaluation: PACU Anesthesia Type: General Level of consciousness: awake and alert Pain management: pain level controlled Vital Signs Assessment: post-procedure vital signs reviewed and stable Respiratory status: spontaneous breathing, nonlabored ventilation, respiratory function stable and patient connected to nasal cannula oxygen Cardiovascular status: blood pressure returned to baseline and stable Postop Assessment: no apparent nausea or vomiting Anesthetic complications: no   No complications documented.  Last Vitals:  Vitals:   12/15/19 1210 12/15/19 1225  BP: (!) 121/86 127/82  Pulse: (!) 115 97  Resp: 14 (!) 9  Temp:  36.9 C  SpO2: 91% 96%    Last Pain:  Vitals:   12/15/19 1140  TempSrc:   PainSc: 0-No pain                 Cecile Hearing

## 2019-12-15 NOTE — Anesthesia Procedure Notes (Signed)
Procedure Name: Intubation Date/Time: 12/15/2019 8:28 AM Performed by: Dairl Ponder, CRNA Pre-anesthesia Checklist: Patient identified, Emergency Drugs available, Suction available, Patient being monitored and Timeout performed Patient Re-evaluated:Patient Re-evaluated prior to induction Oxygen Delivery Method: Circle system utilized Preoxygenation: Pre-oxygenation with 100% oxygen Induction Type: IV induction Ventilation: Mask ventilation without difficulty Laryngoscope Size: Glidescope and 4 (limited neck ROM) Grade View: Grade I Tube type: Oral Tube size: 7.5 mm Number of attempts: 1 Placement Confirmation: ETT inserted through vocal cords under direct vision,  positive ETCO2 and breath sounds checked- equal and bilateral Secured at: 23 cm Tube secured with: Tape Dental Injury: Teeth and Oropharynx as per pre-operative assessment

## 2019-12-15 NOTE — Op Note (Signed)
PREOP DIAGNOSIS: L3-4 moderate to severe stenosis, L4-5 severe stenosis with left inferiorly migrated herniated nucleus pulposus, L5-S1 moderate stenosis with neurogenic claudication  POSTOP DIAGNOSIS: Same  PROCEDURE: 1. L3-L4, L4-L5, L5-S1 laminectomy and microdiscectomy for decompression of neural elements 2. L4-L5 microdiscectomy, removal of herniated nucleus pulposus 3. Use of operating microscope 4. Use of intraoperative fluoroscopy  SURGEON: Dr. Kendell Bane C. Marissa Lowrey, DO  ASSISTANT: Dr. Coletta Memos, MD  ANESTHESIA: General Endotracheal  EBL: 100cc  SPECIMENS: None  DRAINS: Medium hemovac  COMPLICATIONS: None  CONDITION: Stable  HISTORY: Connor Vaughn is a 59 y.o. 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  He had a recent fall and his left leg was weaker than normal.  He noted that he had been dragging it since his fall, MRI revealed L3-4 moderate stenosis, L4-5 severe stenosis with herniated nucleus pulposus, inferiorly migrated, and L5-S1 moderate stenosis.  I counseled him on the risks and benefits of the procedure including but not limited to heart attack, stroke, death, permanent or temporary neurologic deficit, CSF leak, wound infection, bleeding, need for further surgery.  He and his wife agreed to proceed with the lumbar laminectomy and discectomy.  PROCEDURE IN DETAIL: After informed consent was obtained and witnessed, the patient was brought to the operating room at [facility]. After induction of general anesthesia, the patient was positioned on the operative table in the prone position with all pressure points meticulously padded. The skin of the low back was then prepped and draped in the usual sterile fashion.  Under fluoroscopy, the correct level was identified and marked out on the skin, and after timeout was  conducted, the skin was infiltrated with local anesthetic. Skin incision was then made sharply and Bovie electrocautery was used to dissect the subcutaneous tissue until the lumbodorsal fascia was identified. The fashion was then incised using Bovie electrocautery and the lamina at the L3-S1 levels was identified and dissection was carried out in the subperiosteal plane. Self-retaining retractor was then placed, and intraoperative x-ray was taken to confirm we were at the correct level.  L3-4 Laminectomy: Using a high-speed drill, the L3 laminectomy was completed bilaterally with a partial medial facetectomy.  This was carried superiorly to the attachment point of the ligamentum flavum under the L3 lamina.  The superior portion of the L4 lamina was removed with the high-speed drill bilaterally until the epidural space was identified.  The ligamentum flavum was then removed bilaterally and the lateral edge of the thecal sac was identified.  The lateral recesses were carefully dissected with a blunt nerve hook to be free from ligamentum flavum.  The ligamentum flavum in the lateral recess was resected with Kerrison rongeurs.  There was a significant portion of epidural lipomatosis at this level which was resected using microcurettes and suction.  The thecal sac appeared very well decompressed and pulsatile.  Hemostasis at this level was obtained with Gelfoam and thrombin.  L4-5 Laminectomy: Using a high-speed drill, the L4 laminectomy was completed bilaterally with a partial medial facetectomy.  This was carried superiorly to the attachment point of the ligamentum flavum under the L4 lamina which was the remainder of the L4 lamina.  The superior portion of the L5 lamina was removed with the high-speed drill bilaterally until the epidural space was identified.  The ligamentum flavum was then removed bilaterally and the  lateral edge of the thecal sac was identified. The lateral recesses were carefully dissected with  a blunt nerve hook to be free from ligamentum flavum. The ligamentum flavum in the lateral recess was resected with Kerrison rongeurs. Dissection was then carried out on the left lateral recess superior and lateral to the nerve root to identify the disc herniation. The traversing nerve root appear tense and elevated and was carefully retracted to expose the disc herniation. The posterior annulus was then coagulated with bipolar forceps and incised with an 11-blade and using a combination of dissectors, curettes, and pituitary rongeurs, the inferiorly migrated herniated disc fragment was removed. The thecal sac appeared very well decompressed and pulsatile as well as the exiting and traversing nerve root.  Hemostasis at this level was obtained with Gelfoam and thrombin.  L5-S1 Laminectomy: Using a high-speed drill, the L5 laminectomy was completed bilaterally with a partial medial facetectomy.  This was carried superiorly to the attachment point of the ligamentum flavum under the L5 lamina, which was the remainder of the L5 lamina.  The superior portion of the S1 lamina was removed with the high-speed drill bilaterally until the epidural space was identified.  The ligamentum flavum was then removed bilaterally and the lateral edge of the thecal sac was identified.  The lateral recesses were carefully dissected with a blunt nerve hook to be free from ligamentum flavum.  The ligamentum flavum in the lateral recess was resected with Kerrison rongeurs.  There was a moderate amount of epidural lipomatosis at this level which was resected using microcurettes and suction.  The thecal sac appeared very well decompressed and pulsatile.  Hemostasis at this level was obtained with Gelfoam and thrombin.  Hemostasis was then secured using a combination of morcellized Gelfoam and thrombin and bipolar electrocautery. The wound is irrigated with copious amounts of antibiotic saline irrigation. The nerve root was then covered  with a long-acting steroid solution.  A medium Hemovac was tunneled out of the incision.  Self-retaining retractor was then removed, and the wound is closed in layers using a combination of interrupted 0-Vicryl and 2-0 Vicryl stitches. The skin was closed using staples.  Sterile dressing was applied.  At the end of the case all sponge, needle, and instrument counts were correct. The patient was then transferred to the stretcher, extubated and taken to the postanesthesia care unit in stable hemodynamic condition.

## 2019-12-15 NOTE — Progress Notes (Signed)
Patient came down from the floor for surgery. Upon IV assessment, patient stated his right arm has been hurting. Surgeon and CRNA in room. IV site reddened.  IV infiltrated so it was removed. Reddened area outlined with correct site surgical skin marker. Information relayed via telephone to the RN who will be taking care of patient upon return from surgery.

## 2019-12-16 ENCOUNTER — Encounter (HOSPITAL_COMMUNITY): Payer: Self-pay | Admitting: Neurological Surgery

## 2019-12-16 LAB — PREPARE PLATELET PHERESIS
Unit division: 0
Unit division: 0
Unit division: 0
Unit division: 0

## 2019-12-16 LAB — CBC WITH DIFFERENTIAL/PLATELET
Abs Immature Granulocytes: 0.03 10*3/uL (ref 0.00–0.07)
Basophils Absolute: 0 10*3/uL (ref 0.0–0.1)
Basophils Relative: 0 %
Eosinophils Absolute: 0 10*3/uL (ref 0.0–0.5)
Eosinophils Relative: 0 %
HCT: 32.1 % — ABNORMAL LOW (ref 39.0–52.0)
Hemoglobin: 10.5 g/dL — ABNORMAL LOW (ref 13.0–17.0)
Immature Granulocytes: 0 %
Lymphocytes Relative: 10 %
Lymphs Abs: 0.8 10*3/uL (ref 0.7–4.0)
MCH: 27.7 pg (ref 26.0–34.0)
MCHC: 32.7 g/dL (ref 30.0–36.0)
MCV: 84.7 fL (ref 80.0–100.0)
Monocytes Absolute: 0.8 10*3/uL (ref 0.1–1.0)
Monocytes Relative: 11 %
Neutro Abs: 5.8 10*3/uL (ref 1.7–7.7)
Neutrophils Relative %: 79 %
Platelets: 95 10*3/uL — ABNORMAL LOW (ref 150–400)
RBC: 3.79 MIL/uL — ABNORMAL LOW (ref 4.22–5.81)
RDW: 15.6 % — ABNORMAL HIGH (ref 11.5–15.5)
WBC: 7.4 10*3/uL (ref 4.0–10.5)
nRBC: 0 % (ref 0.0–0.2)

## 2019-12-16 LAB — BPAM PLATELET PHERESIS
Blood Product Expiration Date: 202107252359
Blood Product Expiration Date: 202107262359
Blood Product Expiration Date: 202107262359
Blood Product Expiration Date: 202107262359
ISSUE DATE / TIME: 202107250734
ISSUE DATE / TIME: 202107252306
ISSUE DATE / TIME: 202107251756
ISSUE DATE / TIME: 202107252306
Unit Type and Rh: 6200
Unit Type and Rh: 7300
Unit Type and Rh: 5100
Unit Type and Rh: 7300

## 2019-12-16 LAB — COMPREHENSIVE METABOLIC PANEL
ALT: 35 U/L (ref 0–44)
AST: 51 U/L — ABNORMAL HIGH (ref 15–41)
Albumin: 3.1 g/dL — ABNORMAL LOW (ref 3.5–5.0)
Alkaline Phosphatase: 71 U/L (ref 38–126)
Anion gap: 8 (ref 5–15)
BUN: 16 mg/dL (ref 6–20)
CO2: 27 mmol/L (ref 22–32)
Calcium: 8.8 mg/dL — ABNORMAL LOW (ref 8.9–10.3)
Chloride: 102 mmol/L (ref 98–111)
Creatinine, Ser: 1.26 mg/dL — ABNORMAL HIGH (ref 0.61–1.24)
GFR calc Af Amer: >60 mL/min (ref >60)
GFR calc non Af Amer: >60 mL/min (ref >60)
Glucose, Bld: 172 mg/dL — ABNORMAL HIGH (ref 70–99)
Potassium: 4.3 mmol/L (ref 3.5–5.1)
Sodium: 137 mmol/L (ref 135–145)
Total Bilirubin: 0.7 mg/dL (ref 0.3–1.2)
Total Protein: 5.7 g/dL — ABNORMAL LOW (ref 6.5–8.1)

## 2019-12-16 LAB — GLUCOSE, CAPILLARY
Glucose-Capillary: 145 mg/dL — ABNORMAL HIGH (ref 70–99)
Glucose-Capillary: 140 mg/dL — ABNORMAL HIGH (ref 70–99)
Glucose-Capillary: 211 mg/dL — ABNORMAL HIGH (ref 70–99)
Glucose-Capillary: 204 mg/dL — ABNORMAL HIGH (ref 70–99)

## 2019-12-16 LAB — BRAIN NATRIURETIC PEPTIDE: B Natriuretic Peptide: 102.3 pg/mL — ABNORMAL HIGH (ref 0.0–100.0)

## 2019-12-16 LAB — MAGNESIUM: Magnesium: 2 mg/dL (ref 1.7–2.4)

## 2019-12-16 LAB — C-REACTIVE PROTEIN: CRP: 1.3 mg/dL — ABNORMAL HIGH (ref <1.0)

## 2019-12-16 MED ORDER — METHOCARBAMOL 500 MG PO TABS: 500.0000 mg | ORAL_TABLET | Freq: Four times a day (QID) | ORAL | Status: DC | PRN

## 2019-12-16 NOTE — Evaluation (Signed)
Physical Therapy Re-Evaluation Patient Details Name: Connor Vaughn MRN: [id number] DOB: March 19, 1961 Today's Date: 12/16/2019   History of Present Illness  Pt is a 59 y.o. male admitted 12/11/19 with falls, worsening BLE paresthesias and LLE weakness. Workup revealed moderate to severe lumbar stenosis L3-S1, herniated nucleus pulposus L4-5. Also with hyperreflexia, awaiting cervical spine MRI. s/p L3-S1 laminectomy, L L4-5 discectomy 12/15/19. PMH includes DM2, cirrhosis, HTN, chronic back pain, Hep C.  Clinical Impression   Pt seen post-op with some improvement in lack and LE pain/altered sensation, but states he still has some numbness and tingling in L thigh. Pt ambulating well with use of RW, is able to ambulate without but prefers to use AD at this time for stability. Pt ambulated great hallway distance with cuing needed for form and safety post-operatively. Pt with good recall and carryover of spinal precautions, PT to continue to follow acutely.      Follow Up Recommendations No PT follow up;Supervision - Intermittent (pending progression)    Equipment Recommendations  Rolling walker with 5" wheels    Recommendations for Other Services       Precautions / Restrictions Precautions Precautions: Fall;Back Precaution Booklet Issued: No Precaution Comments: Verbally reviewed no bending, lifting, twisting, arching spine. Pt recalls 3/4 precautions from previous session Restrictions Weight Bearing Restrictions: No      Mobility  Bed Mobility               General bed mobility comments: Received sitting in recliner  Transfers Overall transfer level: Needs assistance Equipment used: None Transfers: Sit to/from Stand Sit to Stand: Supervision         General transfer comment: for safety, verbal cuing for hand placement when rising/sitting  Ambulation/Gait Ambulation/Gait assistance: Supervision Gait Distance (Feet): 400 Feet Assistive device: Rolling walker (2  wheeled) Gait Pattern/deviations: Step-through pattern;Decreased stride length;Antalgic;Shuffle;Trunk flexed Gait velocity: decr   General Gait Details: Supervision for safety, verbal cuing for upright posture, taking larger steps, and positioning in RW.  Stairs            Wheelchair Mobility    Modified Rankin (Stroke Patients Only)       Balance Overall balance assessment: Needs assistance   Sitting balance-Leahy Scale: Good       Standing balance-Leahy Scale: Fair Standing balance comment: can ambulate without RW, feels more steady with AD at this time                             Pertinent Vitals/Pain Pain Assessment: 0-10 Pain Score: 8  Pain Location: back Pain Descriptors / Indicators: Sore;Discomfort;Operative site guarding Pain Intervention(s): Limited activity within patient's tolerance;Monitored during session;Repositioned;Premedicated before session    Home Living Family/patient expects to be discharged to:: Private residence Living Arrangements: Spouse/significant other Available Help at Discharge: Family;Friend(s);Available PRN/intermittently Type of Home: Mobile home Home Access: Stairs to enter Entrance Stairs-Rails: Right Entrance Stairs-Number of Steps: 2 Home Layout: One level Home Equipment: Cane - single point Additional Comments: Wife works full-time. Could have daughter available for assist if needed upon return home    Prior Function Level of Independence: Independent with assistive device(s)         Comments: Was independent, but most recently using SPC due to back/leg pain and weakness. Reports short community distances, but typically stays at home de to pain/weakness     Hand Dominance        Extremity/Trunk Assessment   Upper Extremity Assessment Upper  Extremity Assessment: Defer to OT evaluation    Lower Extremity Assessment Lower Extremity Assessment: Generalized weakness LLE Deficits / Details: L hip  altered sensation, numbness and tingling but better than prior to surgery LLE Sensation: decreased light touch    Cervical / Trunk Assessment Cervical / Trunk Assessment: Other exceptions Cervical / Trunk Exceptions: s/p lumbar surgery  Communication   Communication: No difficulties  Cognition Arousal/Alertness: Awake/alert Behavior During Therapy: WFL for tasks assessed/performed Overall Cognitive Status: Within Functional Limits for tasks assessed                                        General Comments      Exercises Other Exercises Other Exercises: encouraged frequent ankle pumps while up in recliner for circulation   Assessment/Plan    PT Assessment Patient needs continued PT services  PT Problem List Decreased strength;Decreased activity tolerance;Decreased balance;Decreased mobility;Decreased knowledge of use of DME;Decreased knowledge of precautions;Impaired sensation;Cardiopulmonary status limiting activity       PT Treatment Interventions DME instruction;Gait training;Stair training;Functional mobility training;Therapeutic activities;Therapeutic exercise;Balance training;Patient/family education;Neuromuscular re-education    PT Goals (Current goals can be found in the Care Plan section)  Acute Rehab PT Goals Patient Stated Goal: home PT Goal Formulation: With patient Time For Goal Achievement: 12/27/19 Potential to Achieve Goals: Good    Frequency Min 3X/week   Barriers to discharge        Co-evaluation               AM-PAC PT "6 Clicks" Mobility  Outcome Measure Help needed turning from your back to your side while in a flat bed without using bedrails?: A Little Help needed moving from lying on your back to sitting on the side of a flat bed without using bedrails?: A Little Help needed moving to and from a bed to a chair (including a wheelchair)?: A Little Help needed standing up from a chair using your arms (e.g., wheelchair or  bedside chair)?: A Little Help needed to walk in hospital room?: A Little Help needed climbing 3-5 steps with a railing? : A Little 6 Click Score: 18    End of Session   Activity Tolerance: Patient tolerated treatment well Patient left: in chair;with call bell/phone within reach Nurse Communication: Mobility status PT Visit Diagnosis: Other abnormalities of gait and mobility (R26.89);Other symptoms and signs involving the nervous system (R29.898)    Time: 7829-5621 PT Time Calculation (min) (ACUTE ONLY): 21 min   Charges:   PT Evaluation $PT Re-evaluation: 1 Re-eval         Duell Holdren E, PT Acute Rehabilitation Services Pager [phone]  Office [phone]  Braleigh Massoud D Jancarlo Biermann 12/16/2019, 10:39 AM

## 2019-12-16 NOTE — Progress Notes (Signed)
PROGRESS NOTE                                                                                                                                                                                                             Patient Demographics:    Connor Vaughn, is a 59 y.o. male, DOB - [birth year], UXL:[id number]  Admit date - 12/11/2019   Admitting Physician Leroy Sea, MD  Outpatient Primary MD for the patient is [name], [name], MD  LOS - 4  Chief Complaint  Patient presents with  . Hypotension  . Loss of Consciousness       Brief Narrative - This is a 59 year old male with history of alcohol abuse, HCV s/p treatment, frequent syncopal episodes, type 2 diabetes, depression/anxiety, cirrhosis, chronic back pain, hypertension and was recently started on losartan who presented to the ED on 7/21 from home following multiple episodes of syncope within a 30-minute time span   Subjective:   Patient in bed, appears comfortable, denies any headache, no fever, no chest pain or pressure, no shortness of breath , no abdominal pain. No focal weakness.   Assessment  & Plan :     1.  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  Does have mild left leg weakness at times upon exertion, so seems to have C6-C7 disc protrusion with some stenosis, TSH stable, B12 low normal, going PT OT, seen by neurosurgeon and he underwent L3-S1 open lami with left L4-5 discectomy on 12/15/2019.  Continue to monitor progress.  Stable likely discharge home in the next 1 to 2 days.  2.  Loss of consciousness.  This was after mechanical fall when he hit his head on the floor.  No seizure-like activity, no preceding symptoms, Head CT nonacute, no headache or focal deficits.  Echocardiogram shows preserved EF of 60% with mild AS, PT OT and monitor.  3.   Back pain.  Chronic.  Supportive care and as in #1 above.  4.  AKI on CKD 2.  Baseline creatinine around 1.3, likely due to dehydration from home dose diuretics.  Offending medications held, hydrate and monitor.  AKI has resolved.  5.  Alcohol use.  Patient had alcohol levels upon presentation however on detailed interview he says he only drinks once or twice every 2 weeks.  Will monitor.  If he withdraws then clearly he is  not stating the truth and will treat appropriately.  6.  History of alcoholic and hep C cirrhosis - Hep C  has been treated, now claims to have quit alcohol for several months, has chronic thrombocytopenia, outpatient GI follow-up.  It is post platelet transfusion prior to surgery on 12/15/2019, platelet count stable.  7. Dehydration and hypotension.  Stable echocardiogram, blood pressure and orthostatics have resolved after IV fluids and TED stockings.  8. DM type II.  Sliding scale.  Lab Results  Component Value Date   HGBA1C 6.6 (H) 12/12/2019   CBG (last 3)  Recent Labs    12/15/19 1811 12/15/19 2021 12/16/19 0735  GLUCAP 176* 232* 145*     Condition - Extremely Guarded  Family Communication  : Called wife [name] on listed cell phone number [phone] no response switched off, called work  [phone] on 12/13/2019 at 10 AM again no response.  Code Status : Full Code  Consults  : NS   Procedures  :    TTE -  1. Technically difficult study, very limited views. Left ventricular ejection fraction, by estimation, is 60 to 65%. The left ventricle has grossly normal function. Left ventricular endocardial border not optimally defined to evaluate regional wall motion. Left ventricular diastolic function could not be evaluated.  2. Right ventricule is poorly visualized but appears systolic function is grossly normal. The right ventricular size appears mildly enlarged.  3. The mitral valve is normal in structure. No evidence of mitral valve regurgitation.  4. The  aortic valve was not well visualized. Aortic valve regurgitation is not visualized. Aortic stenosis evaluation was limited given technically difficult study, but suspect mild AS (MG , Vmax 2.1 m/s).  MRI C Spine -  Cervical spondylosis superimposed upon a congenitally narrow cervical spinal canal, as outlined and most notably as follows. At C6-C7, a left center disc protrusion contributes to moderate spinal canal stenosis to the left. Additionally, the disc protrusion contacts and mildly flattens the left ventral aspect of the spinal cord. Additionally, the disc protrusion has the potential to affect the exiting left C7 nerve root within the left foraminal entry zone. No more than mild spinal canal stenosis at the remaining levels. Additional sites of neural foraminal narrowing as described and greatest bilaterally at C4-C5 (moderate/severe right, moderate left). Also of note, there is moderate right neural foraminal narrowing at C3-C4.  MRI L Spine - Mild spinal stenosis L2-3.  Moderate spinal stenosis L3-4 Severe spinal stenosis L4-5 with severe subarticular stenosis bilaterally. Large central disc protrusion Disc degeneration and prominent spurring L5-S1 with superimposed central disc protrusion. Moderate spinal stenosis and moderate subarticular and foraminal stenosis bilaterally L5-S1.   S/p L3-S1 open lami with left L4-5 discectomy 12/15/2019   PUD Prophylaxis : PPI  Disposition Plan  :    Status is: Inpatient  Remains inpatient appropriate because:Ongoing diagnostic testing needed not appropriate for outpatient work up and Unsafe d/c plan   Dispo: The patient is from: Home              Anticipated d/c is to: Home              Anticipated d/c date is: 3 days              Patient currently is not medically stable to d/c.   DVT Prophylaxis  : SCDs per N. Surgery    Lab Results  Component Value Date   PLT 95 (L) 12/16/2019    Diet :  Diet  Order            Diet regular Room  service appropriate? Yes; Fluid consistency: Thin  Diet effective now                  Inpatient Medications Scheduled Meds: . busPIRone  10 mg Oral TID  . DULoxetine  60 mg Oral Daily  . folic acid  1 mg Oral Daily  . gabapentin  100 mg Oral TID  . insulin aspart  0-9 Units Subcutaneous TID WC  . metoprolol tartrate  50 mg Oral BID  . multivitamin with minerals  1 tablet Oral Daily  . pantoprazole  40 mg Oral Daily  . thiamine  100 mg Oral Daily   Continuous Infusions:  PRN Meds:.acetaminophen **OR** [DISCONTINUED] acetaminophen, hydrALAZINE, HYDROcodone-acetaminophen, methocarbamol, metoprolol tartrate, morphine injection, [DISCONTINUED] ondansetron **OR** ondansetron (ZOFRAN) IV, senna-docusate  Antibiotics  :   Anti-infectives (From admission, onward)   Start     Dose/Rate Route Frequency Ordered Stop   12/15/19 1600  ceFAZolin (ANCEF) IVPB 2g/100 mL premix        2 g 200 mL/hr over 30 Minutes Intravenous Every 8 hours 12/15/19 1304 12/16/19 0200   12/15/19 0733  ceFAZolin (ANCEF) 2-4 GM/100ML-% IVPB       Note to Pharmacy: Marcelle SmilingMelville, Allison Ch: cabinet override      12/15/19 0733 12/15/19 1944          Objective:   Vitals:   12/16/19 0110 12/16/19 0418 12/16/19 0451 12/16/19 0737  BP: 116/73 125/85  (!) 129/83  Pulse: 81 80  83  Resp: 13 15  16   Temp: 97.9 F (36.6 C) 97.7 F (36.5 C)  98 F (36.7 C)  TempSrc: Oral Oral  Oral  SpO2: 91% 97%  96%  Weight:   (!) 94.3 kg   Height:        SpO2: 96 %  Wt Readings from Last 3 Encounters:  12/16/19 (!) 94.3 kg  04/16/19 95.9 kg  05/12/18 86.2 kg     Intake/Output Summary (Last 24 hours) at 12/16/2019 0944 Last data filed at 12/16/2019 [id number] Gross per 24 hour  Intake 2206 ml  Output 3550 ml  Net -1344 ml     Physical Exam  Awake Alert, No new F.N deficits, Normal affect [place].AT,PERRAL Supple Neck,No JVD, No cervical lymphadenopathy appriciated.  Symmetrical Chest wall movement, Good air movement  bilaterally, CTAB RRR,No Gallops, Rubs or new Murmurs, No Parasternal Heave +ve B.Sounds, Abd Soft, No tenderness, No organomegaly appriciated, No rebound - guarding or rigidity. L.Spine JP drain in place. No Cyanosis, Clubbing or edema, No new Rash or bruise    Data Review:    Recent Labs  Lab 12/11/19 2222 12/12/19 0246 12/14/19 0848 12/14/19 1301 12/15/19 0427 12/15/19 1532 12/16/19 0339  WBC 9.7   < > 2.8* 2.9* 3.1* 5.1 7.4  HGB 14.0   < > 12.5* 12.7* 12.7* 11.8* 10.5*  HCT 42.2   < > 38.5* 38.7* 39.0 35.4* 32.1*  PLT 167   < > 63* 62* 65* 75* 95*  MCV 85.4   < > 84.4 84.5 85.3 83.1 84.7  MCH 28.3   < > 27.4 27.7 27.8 27.7 27.7  MCHC 33.2   < > 32.5 32.8 32.6 33.3 32.7  RDW 16.5*   < > 15.6* 15.6* 15.6* 15.6* 15.6*  LYMPHSABS 2.1  --  0.7  --  0.8  --  0.8  MONOABS 1.1*  --  0.4  --  0.5  --  0.8  EOSABS 0.1  --  0.1  --  0.1  --  0.0  BASOSABS 0.1  --  0.0  --  0.0  --  0.0   < > = values in this interval not displayed.    Recent Labs  Lab 12/11/19 2222 12/11/19 2223 12/12/19 0246 12/12/19 1236 12/13/19 1104 12/14/19 0848 12/14/19 1301 12/15/19 0427 12/16/19 0339  NA  --  139 139  --   --  135  --  136 137  K  --  3.7 3.9  --   --  4.1  --  3.9 4.3  CL  --  98 101  --   --  103  --  102 102  CO2  --  26 26  --   --  26  --  27 27  GLUCOSE  --  175* 176*  --   --  191*  --  150* 172*  BUN  --  21* 20  --   --  11  --  13 16  CREATININE  --  2.89* 2.19*  --   --  1.04  --  1.10 1.26*  CALCIUM  --  8.3* 8.5*  --   --  9.1  --  9.0 8.8*  AST  --  43* 40  --   --  31  --  31 51*  ALT  --  31 29  --   --  26  --  25 35  ALKPHOS  --  79 79  --   --  73  --  76 71  BILITOT  --  1.1 0.3  --   --  1.1  --  0.9 0.7  ALBUMIN  --  3.6 3.5  --   --  3.3*  --  3.2* 3.1*  MG  --  2.2 1.9  --   --  1.8  --  1.8 2.0  CRP  --   --   --   --   --  0.6  --  0.8 1.3*  INR 1.1  --   --   --   --   --  1.1  --   --   TSH  --   --   --   --  3.019  --   --   --   --   HGBA1C   --   --   --  6.6*  --   --   --   --   --   BNP  --   --   --   --   --  79.7  --  37.6 102.3*    Recent Labs  Lab 12/12/19 0100 12/14/19 0848 12/15/19 0427 12/16/19 0339  CRP  --  0.6 0.8 1.3*  BNP  --  79.7 37.6 102.3*  SARSCOV2NAA NEGATIVE  --   --   --     ------------------------------------------------------------------------------------------------------------------ No results for input(s): CHOL, HDL, LDLCALC, TRIG, CHOLHDL, LDLDIRECT in the last 72 hours.  Lab Results  Component Value Date   HGBA1C 6.6 (H) 12/12/2019   ------------------------------------------------------------------------------------------------------------------ Recent Labs    12/13/19 1104  TSH 3.019   ------------------------------------------------------------------------------------------------------------------ Recent Labs    12/13/19 1104  VITAMINB12 349  FOLATE 35.1  FERRITIN 54  TIBC 430  IRON 313*  RETICCTPCT 0.9    Coagulation profile Recent Labs  Lab 12/11/19 2222 12/14/19 1301  INR 1.1 1.1    No results for input(s): DDIMER  in the last 72 hours.  Cardiac Enzymes No results for input(s): CKMB, TROPONINI, MYOGLOBIN in the last 168 hours.  Invalid input(s): CK ------------------------------------------------------------------------------------------------------------------    Component Value Date/Time   BNP 102.3 (H) 12/16/2019 1610    Micro Results Recent Results (from the past 240 hour(s))  SARS Coronavirus 2 by RT PCR (hospital order, performed in [facility] hospital lab) Nasopharyngeal Nasopharyngeal Swab     Status: None   Collection Time: 12/12/19  1:00 AM   Specimen: Nasopharyngeal Swab  Result Value Ref Range Status   SARS Coronavirus 2 NEGATIVE NEGATIVE Final    Comment: (NOTE) SARS-CoV-2 target nucleic acids are NOT DETECTED.  The SARS-CoV-2 RNA is generally detectable in upper and lower respiratory specimens during the acute phase of infection. The  lowest concentration of SARS-CoV-2 viral copies this assay can detect is 250 copies / mL. A negative result does not preclude SARS-CoV-2 infection and should not be used as the sole basis for treatment or other patient management decisions.  A negative result may occur with improper specimen collection / handling, submission of specimen other than nasopharyngeal swab, presence of viral mutation(s) within the areas targeted by this assay, and inadequate number of viral copies (<250 copies / mL). A negative result must be combined with clinical observations, patient history, and epidemiological information.  Fact Sheet for Patients:   BoilerBrush.com.cy  Fact Sheet for Healthcare Providers: https://pope.com/  This test is not yet approved or  cleared by the [place] FDA and has been authorized for detection and/or diagnosis of SARS-CoV-2 by FDA under an Emergency Use Authorization (EUA).  This EUA will remain in effect (meaning this test can be used) for the duration of the COVID-19 declaration under Section 564(b)(1) of the Act, 21 U.S.C. section 360bbb-3(b)(1), unless the authorization is terminated or revoked sooner.  Performed at [facility], [address], [place], [place] [address]   Surgical pcr screen     Status: None   Collection Time: 12/14/19  5:41 PM   Specimen: Nasal Mucosa; Nasal Swab  Result Value Ref Range Status   MRSA, PCR NEGATIVE NEGATIVE Final   Staphylococcus aureus NEGATIVE NEGATIVE Final    Comment: (NOTE) The Xpert SA Assay (FDA approved for NASAL specimens in patients 94 years of age and older), is one component of a comprehensive surveillance program. It is not intended to diagnose infection nor to guide or monitor treatment. Performed at [facility], [address], [place], [place] [address]     Radiology Reports X-ray chest PA and lateral  Result Date:  12/12/2019 CLINICAL DATA:  Syncope, orthostatic hypotension EXAM: CHEST - 2 VIEW COMPARISON:  12/03/2019 FINDINGS: Frontal and lateral views of the chest demonstrate an unremarkable cardiac silhouette. No airspace disease, effusion, or pneumothorax. No acute bony abnormalities. IMPRESSION: 1. No acute intrathoracic process. Electronically Signed   By: Sharlet Salina M.D.   On: 12/12/2019 02:01   CT HEAD WO CONTRAST  Result Date: 12/12/2019 CLINICAL DATA:  Syncope. EXAM: CT HEAD WITHOUT CONTRAST TECHNIQUE: Contiguous axial images were obtained from the base of the skull through the vertex without intravenous contrast. COMPARISON:  04/04/2019 FINDINGS: Brain: There is no evidence for acute hemorrhage, hydrocephalus, mass lesion, or abnormal extra-axial fluid collection. No definite CT evidence for acute infarction. Vascular: No hyperdense vessel or unexpected calcification. Skull: No evidence for fracture. No worrisome lytic or sclerotic lesion. Sinuses/Orbits: The visualized paranasal sinuses and mastoid air cells are clear. Visualized portions of the globes  and intraorbital fat are unremarkable. Other: None. IMPRESSION: Unremarkable exam.  No acute intracranial abnormality. Electronically Signed   By: Kennith Center M.D.   On: 12/12/2019 12:29   MR CERVICAL SPINE WO CONTRAST  Result Date: 12/13/2019 CLINICAL DATA:  Myelopathy, acute or progressive. Additional history provided by scanning technologist: Patient reports neck pain intermittently for 2 years with intermittent numbness and tingling in hands, patient works as a Curator. EXAM: MRI CERVICAL SPINE WITHOUT CONTRAST TECHNIQUE: Multiplanar, multisequence MR imaging of the cervical spine was performed. No intravenous contrast was administered. COMPARISON:  CT cervical spine 05/12/2018 FINDINGS: Alignment: Reversal of the expected cervical lordosis. Minimal C3-C4 grade 1 anterolisthesis. Vertebrae: No significant osseous lesion or significant marrow edema.  Cord: No definite spinal cord signal abnormality is identified. Posterior Fossa, vertebral arteries, paraspinal tissues: No abnormality identified within included portions of the posterior fossa. Flow voids preserved within the imaged cervical vertebral arteries. STIR hyperintensity within the C4-C5 interspinous space appears vascular in nature. Paraspinal soft tissues otherwise unremarkable. Disc levels: Mild diffuse disc degeneration throughout the cervical spine. Congenitally narrow cervical spinal canal. C2-C3: Shallow disc bulge. Right-sided uncinate hypertrophy. Facet hypertrophy. Mild relative spinal canal narrowing. Mild right neural foraminal narrowing. C3-C4: Minimal grade 1 retrolisthesis. Disc uncovering with disc bulge. Bilateral disc osteophyte ridge/uncinate hypertrophy. Facet hypertrophy. Mild/moderate spinal canal stenosis. Moderate right neural foraminal narrowing. C4-C5: Disc bulge. Uncinate/facet hypertrophy. Mild/moderate spinal canal stenosis. Bilateral neural foraminal narrowing (moderate/severe right, moderate left). C5-C6: Shallow central disc protrusion. Facet hypertrophy. Mild spinal canal stenosis. No significant foraminal narrowing. C6-C7: Disc bulge. Superimposed left center disc protrusion. Facet hypertrophy. There is moderate spinal canal stenosis to the left. The disc protrusion contacts and mildly flattens the left ventral aspect of the spinal cord. Additionally, the disc protrusion has the potential to affect the exiting left C7 nerve root within the left foraminal entry zone. C7-T1: Facet and ligamentum flavum hypertrophy. No significant disc herniation or stenosis. IMPRESSION: Cervical spondylosis superimposed upon a congenitally narrow cervical spinal canal, as outlined and most notably as follows. At C6-C7, a left center disc protrusion contributes to moderate spinal canal stenosis to the left. Additionally, the disc protrusion contacts and mildly flattens the left ventral  aspect of the spinal cord. Additionally, the disc protrusion has the potential to affect the exiting left C7 nerve root within the left foraminal entry zone. No more than mild spinal canal stenosis at the remaining levels. Additional sites of neural foraminal narrowing as described and greatest bilaterally at C4-C5 (moderate/severe right, moderate left). Also of note, there is moderate right neural foraminal narrowing at C3-C4. Electronically Signed   By: Jackey Loge DO   On: 12/13/2019 14:59   MR LUMBAR SPINE WO CONTRAST  Result Date: 12/12/2019 CLINICAL DATA:  Low back pain with progressive neurologic deficit. Fall. EXAM: MRI LUMBAR SPINE WITHOUT CONTRAST TECHNIQUE: Multiplanar, multisequence MR imaging of the lumbar spine was performed. No intravenous contrast was administered. COMPARISON:  Lumbar radiographs 12/03/2019 FINDINGS: Segmentation:  Normal Alignment:  Slight retrolisthesis L1-2, L2-3, L3-4, L4-5, L5-S1 Vertebrae:  Normal bone marrow.  Negative for fracture or mass. Conus medullaris and cauda equina: Conus extends to the L1-2 level. Conus and cauda equina appear normal. Paraspinal and other soft tissues: Negative for paraspinous mass or adenopathy. Disc levels: L1-2: Mild disc degeneration and disc bulging. Left lateral osteophyte formation. Negative for spinal or foraminal stenosis L2-3: Mild disc degeneration and disc bulging. Mild facet hypertrophy bilaterally. Mild spinal stenosis and mild subarticular stenosis bilaterally L3-4: Disc  degeneration with diffuse disc bulging and endplate spurring. Shallow central disc protrusion. Mild facet hypertrophy bilaterally. Moderate spinal stenosis and moderate subarticular stenosis bilaterally L4-5: Disc degeneration with diffuse disc bulging and endplate spurring. Large central disc protrusion with downgoing disc material causing compression of thecal sac and severe spinal stenosis. Moderate facet and ligamentum flavum hypertrophy contributes to  stenosis. There is severe subarticular stenosis bilaterally L5-S1: Disc degeneration with prominent diffuse endplate osteophyte formation. Superimposed central disc protrusion with downgoing disc material. Moderate spinal stenosis and moderate subarticular and foraminal stenosis bilaterally. Bilateral facet hypertrophy. IMPRESSION: Mild spinal stenosis L2-3.  Moderate spinal stenosis L3-4 Severe spinal stenosis L4-5 with severe subarticular stenosis bilaterally. Large central disc protrusion Disc degeneration and prominent spurring L5-S1 with superimposed central disc protrusion. Moderate spinal stenosis and moderate subarticular and foraminal stenosis bilaterally L5-S1. Electronically Signed   By: Marlan Palau M.D.   On: 12/12/2019 11:24   DG Lumbar Spine 1 View  Result Date: 12/15/2019 CLINICAL DATA:  L3-S1 laminectomy with L4-5 discectomy. EXAM: DG C-ARM 1-60 MIN; LUMBAR SPINE - 1 VIEW CONTRAST:  None. FLUOROSCOPY TIME:  Fluoroscopy Time:  0 minutes 2 seconds Radiation Exposure Index (if provided by the fluoroscopic device): 2.07 mGy Number of Acquired Spot Images: 1 COMPARISON:  MRI 12/12/2019 FINDINGS: Vertebral body alignment and heights are normal. Mild spondylosis of the lumbar spine. Mild disc space narrowing at the L4-5 and L5-S1 levels. Surgical instrument is present over the posterior soft tissues at the L4 level between the L3 and L4 spinous processes. IMPRESSION: Surgical instrumentation localization as described. Electronically Signed   By: Elberta Fortis M.D.   On: 12/15/2019 12:51   DG C-Arm 1-60 Min  Result Date: 12/15/2019 CLINICAL DATA:  L3-S1 laminectomy with L4-5 discectomy. EXAM: DG C-ARM 1-60 MIN; LUMBAR SPINE - 1 VIEW CONTRAST:  None. FLUOROSCOPY TIME:  Fluoroscopy Time:  0 minutes 2 seconds Radiation Exposure Index (if provided by the fluoroscopic device): 2.07 mGy Number of Acquired Spot Images: 1 COMPARISON:  MRI 12/12/2019 FINDINGS: Vertebral body alignment and heights are  normal. Mild spondylosis of the lumbar spine. Mild disc space narrowing at the L4-5 and L5-S1 levels. Surgical instrument is present over the posterior soft tissues at the L4 level between the L3 and L4 spinous processes. IMPRESSION: Surgical instrumentation localization as described. Electronically Signed   By: Elberta Fortis M.D.   On: 12/15/2019 12:51   ECHOCARDIOGRAM COMPLETE  Result Date: 12/12/2019    ECHOCARDIOGRAM REPORT   Patient Name:   ZOLLIE ELLERY Date of Exam: 12/12/2019 Medical Rec #:  [id number]          Height:       66.0 in Accession #:    [id number]         Weight:       211.5 lb Date of Birth:  1960/08/12          BSA:          2.048 m Patient Age:    59 years           BP:           150/98 mmHg Patient Gender: M                  HR:           96 bpm. Exam Location:  Inpatient Procedure: 2D Echo Indications:    syncope  History:        Patient has prior history of Echocardiogram examinations, most  recent 12/25/2015. Risk Factors:Diabetes and Former Smoker.  Sonographer:    Celene Skeen RDCS (AE) Referring Phys: [id number] DAVID MANUEL ORTIZ  Sonographer Comments: No parasternal window and Technically difficult study due to poor echo windows. Image acquisition challenging due to patient body habitus and Image acquisition challenging due to respiratory motion. IMPRESSIONS  1. Technically difficult study, very limited views. Left ventricular ejection fraction, by estimation, is 60 to 65%. The left ventricle has grossly normal function. Left ventricular endocardial border not optimally defined to evaluate regional wall motion. Left ventricular diastolic function could not be evaluated.  2. Right ventricule is poorly visualized but appears systolic function is grossly normal. The right ventricular size appears mildly enlarged.  3. The mitral valve is normal in structure. No evidence of mitral valve regurgitation.  4. The aortic valve was not well visualized. Aortic valve  regurgitation is not visualized. Aortic stenosis evaluation was limited given technically difficult study, but suspect mild AS (MG , Vmax 2.1 m/s) FINDINGS  Left Ventricle: Left ventricular ejection fraction, by estimation, is 60 to 65%. The left ventricle has normal function. Left ventricular endocardial border not optimally defined to evaluate regional wall motion. The left ventricular internal cavity size was normal in size. There is mild left ventricular hypertrophy. Left ventricular diastolic function could not be evaluated. Right Ventricle: The right ventricular size is mildly enlarged. Right vetricular wall thickness was not assessed. Right ventricular systolic function is normal. Left Atrium: Left atrial size was not well visualized. Right Atrium: Right atrial size was not well visualized. Pericardium: Trivial pericardial effusion is present. Mitral Valve: The mitral valve is normal in structure. No evidence of mitral valve regurgitation. Tricuspid Valve: The tricuspid valve is grossly normal. Tricuspid valve regurgitation is not demonstrated. Aortic Valve: The aortic valve was not well visualized. Aortic valve regurgitation is not visualized. Mild aortic stenosis is present. Aortic valve mean gradient measures 10.0 mmHg. Aortic valve peak gradient measures 16.8 mmHg. Pulmonic Valve: The pulmonic valve was not well visualized. Pulmonic valve regurgitation is not visualized. Aorta: The aortic root was not well visualized. IAS/Shunts: The interatrial septum was not well visualized.   Diastology LV e' lateral: 9.46 cm/s  AORTIC VALVE AV Vmax:           205.00 cm/s AV Vmean:          155.000 cm/s AV VTI:            0.343 m AV Peak Grad:      16.8 mmHg AV Mean Grad:      10.0 mmHg LVOT Vmax:         80.00 cm/s LVOT Vmean:        48.200 cm/s LVOT VTI:          0.119 m LVOT/AV VTI ratio: 0.35  SHUNTS Systemic VTI: 0.12 m Epifanio Lesches MD Electronically signed by Epifanio Lesches MD Signature  Date/Time: 12/12/2019/6:54:35 PM    Final     Time Spent in minutes  30   Susa Raring M.D on 12/16/2019 at 9:44 AM  To page go to [email or web address] - password [facility]

## 2019-12-16 NOTE — Progress Notes (Signed)
Anti=Embolism stockings received.

## 2019-12-16 NOTE — Progress Notes (Signed)
Occupational Therapy Evaluation Patient Details Name: Connor Vaughn MRN: [id number] DOB: 05/28/1960 Today's Date: 12/16/2019    History of Present Illness Pt is a 59 y.o. male admitted 12/11/19 with falls, worsening BLE paresthesias and LLE weakness. Workup revealed moderate to severe lumbar stenosis L3-S1, herniated nucleus pulposus L4-5. Also with hyperreflexia, awaiting cervical spine MRI. s/p L3-S1 laminectomy, L L4-5 discectomy 12/15/19. PMH includes DM2, cirrhosis, HTN, chronic back pain, Hep C.   Clinical Impression   Patient lives with wife at home.  PTA patient independent, though was having increased difficulty due to pain and weakness from back.  Patient presenting today with decreased activity tolerance, balance, and strength.  Patient able to complete mobility with supervision and RW use.  Reviewed back precautions and discussed how to implement them in ADLs.  Patient has dressing equipment at home and verbalized how to properly use.  Without equipment patient requiring max assist with LB ADLs and min/mod with UB.  Will continue to follow with OT acutely to address the deficits listed below.      Follow Up Recommendations  No OT follow up    Equipment Recommendations  None recommended by OT    Recommendations for Other Services       Precautions / Restrictions Precautions Precautions: Fall;Back Precaution Booklet Issued: No Precaution Comments: Verbally reviewed no bending, lifting, twisting, arching spine. Pt recalls 3/4 precautions from previous session Restrictions Weight Bearing Restrictions: No      Mobility Bed Mobility               General bed mobility comments: Received sitting in recliner  Transfers Overall transfer level: Needs assistance Equipment used: Rolling walker (2 wheeled) Transfers: Sit to/from Stand Sit to Stand: Supervision         General transfer comment: for safety, verbal cuing for hand placement when rising/sitting     Balance Overall balance assessment: Needs assistance Sitting-balance support: Feet supported;No upper extremity supported Sitting balance-Leahy Scale: Good     Standing balance support: Bilateral upper extremity supported;During functional activity Standing balance-Leahy Scale: Fair Standing balance comment: can ambulate without RW, feels more steady with AD at this time                           ADL either performed or assessed with clinical judgement   ADL Overall ADL's : Needs assistance/impaired Eating/Feeding: Independent;Sitting   Grooming: Min guard;Standing   Upper Body Bathing: Sitting;Moderate assistance   Lower Body Bathing: Maximal assistance;Sit to/from stand Lower Body Bathing Details (indicate cue type and reason): without AE Upper Body Dressing : Moderate assistance;Sitting   Lower Body Dressing: Maximal assistance;Sit to/from stand Lower Body Dressing Details (indicate cue type and reason): without AE Toilet Transfer: Min guard;Regular Toilet;RW   Toileting- Clothing Manipulation and Hygiene: Supervision/safety;Sitting/lateral lean Toileting - Clothing Manipulation Details (indicate cue type and reason): Educated on form to not twist spine when reaching behind     Functional mobility during ADLs: Warehouse manager      Pertinent Vitals/Pain Pain Assessment: 0-10 Pain Score: 8  Pain Location: back Pain Descriptors / Indicators: Sore;Discomfort;Operative site guarding Pain Intervention(s): Limited activity within patient's tolerance;Monitored during session;Repositioned;RN gave pain meds during session     Hand Dominance     Extremity/Trunk Assessment Upper Extremity Assessment Upper Extremity Assessment: Generalized weakness   Lower Extremity  Assessment Lower Extremity Assessment: Defer to PT evaluation LLE Deficits / Details: L hip altered sensation, numbness and tingling but  better than prior to surgery LLE Sensation: decreased light touch   Cervical / Trunk Assessment Cervical / Trunk Assessment: Other exceptions Cervical / Trunk Exceptions: s/p lumbar surgery   Communication Communication Communication: No difficulties   Cognition Arousal/Alertness: Awake/alert Behavior During Therapy: WFL for tasks assessed/performed Overall Cognitive Status: Within Functional Limits for tasks assessed                                 General Comments: Recalled 3/4 back precautions.  Anticipatory and problem solving skills good   General Comments       Exercises Other Exercises Other Exercises: encouraged frequent ankle pumps while up in recliner for circulation   Shoulder Instructions      Home Living Family/patient expects to be discharged to:: Private residence Living Arrangements: Spouse/significant other Available Help at Discharge: Family;Friend(s);Available PRN/intermittently Type of Home: Mobile home Home Access: Stairs to enter Entrance Stairs-Number of Steps: 2 Entrance Stairs-Rails: Right Home Layout: One level     Bathroom Shower/Tub: Chief Strategy Officer: Standard     Home Equipment: Cane - single point;Adaptive equipment Adaptive Equipment: Reacher;Sock aid Additional Comments: Wife works full-time. Could have daughter available for assist if needed upon return home      Prior Functioning/Environment Level of Independence: Independent with assistive device(s)        Comments: Was independent, but most recently using SPC due to back/leg pain and weakness. Reports short community distances, but typically stays at home de to pain/weakness        OT Problem List: Decreased strength;Decreased range of motion;Decreased activity tolerance;Impaired balance (sitting and/or standing);Decreased knowledge of use of DME or AE;Decreased knowledge of precautions;Pain      OT Treatment/Interventions: Self-care/ADL  training;Therapeutic exercise;DME and/or AE instruction;Therapeutic activities;Patient/family education;Balance training    OT Goals(Current goals can be found in the care plan section) Acute Rehab OT Goals Patient Stated Goal: home OT Goal Formulation: With patient Time For Goal Achievement: 12/30/19 Potential to Achieve Goals: Good  OT Frequency: Min 2X/week   Barriers to D/C:            Co-evaluation              AM-PAC OT "6 Clicks" Daily Activity     Outcome Measure Help from another person eating meals?: None Help from another person taking care of personal grooming?: A Little Help from another person toileting, which includes using toliet, bedpan, or urinal?: A Little Help from another person bathing (including washing, rinsing, drying)?: A Lot Help from another person to put on and taking off regular upper body clothing?: A Lot Help from another person to put on and taking off regular lower body clothing?: A Lot 6 Click Score: 16   End of Session Equipment Utilized During Treatment: Engineer, water Communication: Mobility status  Activity Tolerance: Patient tolerated treatment well Patient left: in chair;with call bell/phone within reach  OT Visit Diagnosis: Unsteadiness on feet (R26.81);Muscle weakness (generalized) (M62.81);Pain Pain - part of body:  (back)                Time: 7829-5621 OT Time Calculation (min): 25 min Charges:  OT General Charges $OT Visit: 1 Visit OT Evaluation $OT Eval Moderate Complexity: 1 Mod OT Treatments $Self Care/Home Management : 8-22 mins  Barbie Banner,  OTR/L   Adella Hare 12/16/2019, 2:07 PM

## 2019-12-16 NOTE — Progress Notes (Signed)
   Providing Compassionate, Quality Care - Together  NEUROSURGERY PROGRESS NOTE   S: No issues overnight. Legs feel improved strength and sensation BL. Ambulated well  O: EXAM:  BP (!) 129/83 (BP Location: Left Arm)   Pulse 83   Temp 98 F (36.7 C) (Oral)   Resp 16   Ht 5\' 6"  (1.676 m)   Wt (!) 94.3 kg   SpO2 96%   BMI 33.57 kg/m   Awake, alert, oriented  Speech fluent, appropriate  CN grossly intact  5/5 BUE/BLE  Incision c/d/i hmv in place  ASSESSMENT:  59 y.o. male with  1.Lumbar stenosis,moderate to severe,L3-4, L4-5, L5-S1with neurogenic claudication 2.Herniated nucleus pulposus L4-5,causing severe canal stenosis  S/p L3-S1 open lami with left L4-5 discectomy 12/15/2019  PLAN: - floor - pt/ot - dvt ppx w SCDS NO LOVENOX please - neuro checks - monitor hmv - pain control - doing very well   Thank you for allowing me to participate in this patient's care.  Please do not hesitate to call with questions or concerns.   12/17/2019, DO Neurosurgeon [facility] Cell: [phone]

## 2019-12-16 NOTE — Plan of Care (Signed)
  Problem: Education: Goal: Knowledge of disease or condition will improve Outcome: Progressing Goal: Understanding of discharge needs will improve Outcome: Progressing   Problem: Health Behavior/Discharge Planning: Goal: Ability to identify changes in lifestyle to reduce recurrence of condition will improve Outcome: Progressing Goal: Identification of resources available to assist in meeting health care needs will improve Outcome: Progressing   Problem: Physical Regulation: Goal: Complications related to the disease process, condition or treatment will be avoided or minimized Outcome: Progressing   Problem: Clinical Measurements: Goal: Ability to maintain clinical measurements within normal limits will improve Outcome: Progressing Goal: Will remain free from infection Outcome: Progressing Goal: Diagnostic test results will improve Outcome: Progressing Goal: Respiratory complications will improve Outcome: Progressing Goal: Cardiovascular complication will be avoided Outcome: Progressing   Problem: Nutrition: Goal: Adequate nutrition will be maintained Outcome: Progressing   Problem: Coping: Goal: Level of anxiety will decrease Outcome: Progressing   Problem: Pain Managment: Goal: General experience of comfort will improve Outcome: Progressing

## 2019-12-16 NOTE — TOC Initial Note (Addendum)
Transition of Care [facility]) - Initial/Assessment Note    Patient Details  Name: Connor Vaughn MRN: [id number] Date of Birth: 1960-10-10  [facility] CM/SW Contact:    Mearl Latin, LCSW Phone Number: 12/16/2019, 12:32 PM  Clinical Narrative:                 CSW received consult for ETOH use. CSW spoke with patient. He reported that he lives at home with his wife who works during the day. CSW confirmed address and PCP (Dr. Elyn Peers with [facility]). CSW discussed alcohol use. Patient reported that he only drinks socially and he does not feel like he has any issues with his alcohol use. He pleasantly declined resources at this time. He does request a rolling walker if paid for by insurance. CSW will order from [employer] and have it delivered to the room prior to discharge. No other needs identified at this time.   Expected Discharge Plan: Home/Self Care Barriers to Discharge: Continued Medical Work up   Patient Goals and CMS Choice Patient states their goals for this hospitalization and ongoing recovery are:: Return home      Expected Discharge Plan and Services Expected Discharge Plan: Home/Self Care In-house Referral: Clinical Social Work Discharge Planning Services: CM Consult Post Acute Care Choice: Durable Medical Equipment Living arrangements for the past 2 months: Single Family Home                 DME Arranged: Walker rolling with seat DME Agency: [employer] Date DME Agency Contacted: 12/16/19 Time DME Agency Contacted: 1231 Representative spoke with at DME Agency: Ian Malkin HH Arranged: NA          Prior Living Arrangements/Services Living arrangements for the past 2 months: Single Family Home Lives with:: Spouse Patient language and need for interpreter reviewed:: Yes Do you feel safe going back to the place where you live?: Yes      Need for Family Participation in Patient Care: No (Comment) Care giver support system in place?: Yes (comment)    Criminal Activity/Legal Involvement Pertinent to Current Situation/Hospitalization: No - Comment as needed  Activities of Daily Living Home Assistive Devices/Equipment: Eyeglasses ADL Screening (condition at time of admission) Patient's cognitive ability adequate to safely complete daily activities?: Yes Is the patient deaf or have difficulty hearing?: No Does the patient have difficulty seeing, even when wearing glasses/contacts?: No Does the patient have difficulty concentrating, remembering, or making decisions?: No Patient able to express need for assistance with ADLs?: Yes Does the patient have difficulty dressing or bathing?: No Independently performs ADLs?: Yes (appropriate for developmental age) Does the patient have difficulty walking or climbing stairs?: Yes (secondary to syncope and weakness) Weakness of Legs: Both Weakness of Arms/Hands: None  Permission Sought/Granted Permission sought to share information with : Facility Industrial/product designer granted to share information with : Yes, Verbal Permission Granted     Permission granted to share info w AGENCY: [employer]        Emotional Assessment Appearance:: Appears stated age Attitude/Demeanor/Rapport: Gracious Affect (typically observed): Accepting, Appropriate, Pleasant Orientation: : Oriented to Self, Oriented to Place, Oriented to  Time, Oriented to Situation Alcohol / Substance Use: Alcohol Use Psych Involvement: No (comment)  Admission diagnosis:  Orthostatic hypotension [I95.1] Syncope and collapse [R55] Syncope [R55] AKI (acute kidney injury) (HCC) [N17.9] QT prolongation [R94.31] Neurogenic claudication due to lumbar spinal stenosis [W09.811] Patient Active Problem List   Diagnosis Date Noted  . Neurogenic claudication due to lumbar  spinal stenosis 12/15/2019  . AKI (acute kidney injury) (HCC) 12/12/2019  . DKA (diabetic ketoacidoses) (HCC) 04/04/2019  . Acute blood loss anemia 04/04/2019  .  Pancreatitis 04/04/2019  . Lactic acidosis 04/04/2019  . Encounter for central line placement   . Sepsis with acute renal failure without septic shock (HCC)   . Upper GI bleed   . Recurrent right pleural effusion 01/10/2016  . Alcoholic cirrhosis of liver with ascites (HCC)   . Pleural effusion 12/24/2015  . Pleural effusion, right 12/24/2015  . Alcohol abuse 12/24/2015  . Syncope 12/24/2015   PCP:  Nonnie Done., MD Pharmacy:   [facility] [address], [place] - [address] [facility] [place] [address] Phone: [phone] Fax: [phone]     Social Determinants of Health (SDOH) Interventions    Readmission Risk Interventions No flowsheet data found.

## 2019-12-16 NOTE — Progress Notes (Signed)
Patient is sitting up in chair with assistance of PT today.  I advised patient that surgeon called this morning and we discussed when he is discharged home for pain medication.

## 2019-12-17 LAB — COMPREHENSIVE METABOLIC PANEL
ALT: 27 U/L (ref 0–44)
AST: 38 U/L (ref 15–41)
Albumin: 3.1 g/dL — ABNORMAL LOW (ref 3.5–5.0)
Alkaline Phosphatase: 64 U/L (ref 38–126)
Anion gap: 5 (ref 5–15)
BUN: 16 mg/dL (ref 6–20)
CO2: 29 mmol/L (ref 22–32)
Calcium: 8.8 mg/dL — ABNORMAL LOW (ref 8.9–10.3)
Chloride: 105 mmol/L (ref 98–111)
Creatinine, Ser: 1.21 mg/dL (ref 0.61–1.24)
GFR calc Af Amer: >60 mL/min (ref >60)
GFR calc non Af Amer: >60 mL/min (ref >60)
Glucose, Bld: 182 mg/dL — ABNORMAL HIGH (ref 70–99)
Potassium: 4.6 mmol/L (ref 3.5–5.1)
Sodium: 139 mmol/L (ref 135–145)
Total Bilirubin: 0.6 mg/dL (ref 0.3–1.2)
Total Protein: 5.5 g/dL — ABNORMAL LOW (ref 6.5–8.1)

## 2019-12-17 LAB — CBC WITH DIFFERENTIAL/PLATELET
Abs Immature Granulocytes: 0.01 10*3/uL (ref 0.00–0.07)
Basophils Absolute: 0 10*3/uL (ref 0.0–0.1)
Basophils Relative: 1 %
Eosinophils Absolute: 0.1 10*3/uL (ref 0.0–0.5)
Eosinophils Relative: 2 %
HCT: 32.4 % — ABNORMAL LOW (ref 39.0–52.0)
Hemoglobin: 10.3 g/dL — ABNORMAL LOW (ref 13.0–17.0)
Immature Granulocytes: 0 %
Lymphocytes Relative: 28 %
Lymphs Abs: 1.4 10*3/uL (ref 0.7–4.0)
MCH: 27.7 pg (ref 26.0–34.0)
MCHC: 31.8 g/dL (ref 30.0–36.0)
MCV: 87.1 fL (ref 80.0–100.0)
Monocytes Absolute: 0.7 10*3/uL (ref 0.1–1.0)
Monocytes Relative: 14 %
Neutro Abs: 2.6 10*3/uL (ref 1.7–7.7)
Neutrophils Relative %: 55 %
Platelets: 82 10*3/uL — ABNORMAL LOW (ref 150–400)
RBC: 3.72 MIL/uL — ABNORMAL LOW (ref 4.22–5.81)
RDW: 16.1 % — ABNORMAL HIGH (ref 11.5–15.5)
WBC: 4.8 10*3/uL (ref 4.0–10.5)
nRBC: 0 % (ref 0.0–0.2)

## 2019-12-17 LAB — GLUCOSE, CAPILLARY: Glucose-Capillary: 142 mg/dL — ABNORMAL HIGH (ref 70–99)

## 2019-12-17 LAB — MAGNESIUM: Magnesium: 1.9 mg/dL (ref 1.7–2.4)

## 2019-12-17 LAB — C-REACTIVE PROTEIN: CRP: 0.7 mg/dL (ref <1.0)

## 2019-12-17 LAB — BRAIN NATRIURETIC PEPTIDE: B Natriuretic Peptide: 157.7 pg/mL — ABNORMAL HIGH (ref 0.0–100.0)

## 2019-12-17 MED ORDER — HYDROCODONE-ACETAMINOPHEN 5-325 MG PO TABS: 1.0000 | ORAL_TABLET | Freq: Four times a day (QID) | ORAL | 0 refills | Status: DC | PRN

## 2019-12-17 MED ORDER — SPIRONOLACTONE 25 MG PO TABS: 25.0000 mg | ORAL_TABLET | Freq: Every day | ORAL | 0 refills | Status: DC

## 2019-12-17 MED ORDER — FUROSEMIDE 40 MG PO TABS: 40.0000 mg | ORAL_TABLET | Freq: Every day | ORAL | 0 refills | Status: DC

## 2019-12-17 MED ORDER — METHOCARBAMOL 500 MG PO TABS: 500.0000 mg | ORAL_TABLET | Freq: Three times a day (TID) | ORAL | 1 refills | Status: DC | PRN

## 2019-12-17 MED ORDER — GABAPENTIN 300 MG PO CAPS: 300.0000 mg | ORAL_CAPSULE | Freq: Two times a day (BID) | ORAL | 0 refills | Status: DC

## 2019-12-17 MED ORDER — ACETAMINOPHEN 325 MG PO TABS: 650.0000 mg | ORAL_TABLET | ORAL | 0 refills | Status: DC | PRN

## 2019-12-17 MED ORDER — METOPROLOL TARTRATE 50 MG PO TABS: 50.0000 mg | ORAL_TABLET | Freq: Two times a day (BID) | ORAL | 0 refills | Status: DC

## 2019-12-17 NOTE — TOC Transition Note (Signed)
Transition of Care [facility]) - CM/SW Discharge Note   Patient Details  Name: Connor Vaughn MRN: [id number] Date of Birth: 08-25-1960  Transition of Care [facility]) CM/SW Contact:  Mearl Latin, LCSW Phone Number: 12/17/2019, 9:23 AM   Clinical Narrative:    Patient ready to discharge home by private vehicle. Please ensure rolling walker has been delivered by [employer] to room prior to discharge. No other needs identified.     Final next level of care: Home/Self Care Barriers to Discharge: Barriers Resolved   Patient Goals and CMS Choice Patient states their goals for this hospitalization and ongoing recovery are:: Return home      Discharge Placement                Patient to be transferred to facility by: Car   Patient and family notified of of transfer: 12/17/19  Discharge Plan and Services In-house Referral: Clinical Social Work Discharge Planning Services: CM Consult Post Acute Care Choice: Durable Medical Equipment          DME Arranged: Dan Humphreys rolling with seat DME Agency: [employer] Date DME Agency Contacted: 12/16/19 Time DME Agency Contacted: [phone] Representative spoke with at DME Agency: Ian Malkin HH Arranged: NA          Social Determinants of Health (SDOH) Interventions     Readmission Risk Interventions No flowsheet data found.

## 2019-12-17 NOTE — Progress Notes (Signed)
   Providing Compassionate, Quality Care - Together  NEUROSURGERY PROGRESS NOTE   S: No issues overnight. Back soreness, but continues t feel like legs are improved. Happy with results  O: EXAM:  BP 116/79   Pulse 93   Temp 98.3 F (36.8 C) (Oral)   Resp 16   Ht 5\' 6"  (1.676 m)   Wt (!) 94.8 kg   SpO2 96%   BMI 33.73 kg/m   Awake, alert, oriented  Speech fluent, appropriate  CN grossly intact  5/5 BUE/BLE  Incision cdi Drain removed  ASSESSMENT:  59 y.o. male with  1.Lumbar stenosis,moderate to severe,L3-4, L4-5, L5-S1with neurogenic claudication 2.Herniated nucleus pulposus L4-5,causing severe canal stenosis  S/p L3-S1 open lami with left L4-5 discectomy 12/15/2019  PLAN: -dc home Passed PT/OT Drain removed  F/u 2 weeks    Thank you for allowing me to participate in this patient's care.  Please do not hesitate to call with questions or concerns.   12/17/2019, DO Neurosurgeon [facility] Cell: [phone]

## 2019-12-17 NOTE — Progress Notes (Signed)
Physical Therapy Treatment Patient Details Name: Connor Vaughn MRN: [id number] DOB: Sep 18, 1960 Today's Date: 12/17/2019    History of Present Illness Pt is a 59 y.o. male admitted 12/11/19 with falls, worsening BLE paresthesias and LLE weakness. Workup revealed moderate to severe lumbar stenosis L3-S1, herniated nucleus pulposus L4-5. Also with hyperreflexia, awaiting cervical spine MRI. s/p L3-S1 laminectomy, L L4-5 discectomy 12/15/19. PMH includes DM2, cirrhosis, HTN, chronic back pain, Hep C.    PT Comments    Pt ambulating good hallway distance with good knowledge and application of back precautions during mobility, pt requiring min cuing for form with use of RW. Pt proficiently navigated steps, will have caregiver assist from wife and daughter as needed. Pt to d/c today, all education completed.    Follow Up Recommendations  No PT follow up;Supervision - Intermittent (pending progression)     Equipment Recommendations  Rolling walker with 5" wheels    Recommendations for Other Services       Precautions / Restrictions Precautions Precautions: Fall;Back Precaution Comments: Verbally reviewed no bending, lifting, twisting, arching spine. Pt recalls 3/4 precautions from previous session Restrictions Weight Bearing Restrictions: No Other Position/Activity Restrictions: no brace needed    Mobility  Bed Mobility Overal bed mobility: Needs Assistance Bed Mobility: Rolling;Sidelying to Sit Rolling: Supervision Sidelying to sit: Supervision       General bed mobility comments: verbal cuing for log roll technique, increased time and effort  Transfers Overall transfer level: Needs assistance Equipment used: Rolling walker (2 wheeled) Transfers: Sit to/from Stand Sit to Stand: Supervision;From elevated surface         General transfer comment: for safety, verbal cuing for hand placement when rising and sitting . Bed elevated to imitate bed height at  home  Ambulation/Gait Ambulation/Gait assistance: Supervision Gait Distance (Feet): 300 Feet Assistive device: Rolling walker (2 wheeled) Gait Pattern/deviations: Step-through pattern;Decreased stride length;Shuffle;Trunk flexed Gait velocity: decr   General Gait Details: supervision for safety, verbal cuing for upright posture, positioning in RW, and taking longer steps.   Stairs Stairs: Yes Stairs assistance: Min guard Stair Management: No rails;Forwards;Step to pattern Number of Stairs: 3 General stair comments: min guard with HHA provided for pt self-steadying. Verbal cuing for step-to pattern, caregiver role in standing below to on steps and providing HHA.   Wheelchair Mobility    Modified Rankin (Stroke Patients Only)       Balance Overall balance assessment: Needs assistance Sitting-balance support: Feet supported;No upper extremity supported Sitting balance-Leahy Scale: Good     Standing balance support: Bilateral upper extremity supported;During functional activity Standing balance-Leahy Scale: Fair                              Cognition Arousal/Alertness: Awake/alert Behavior During Therapy: WFL for tasks assessed/performed Overall Cognitive Status: Within Functional Limits for tasks assessed                                        Exercises      General Comments        Pertinent Vitals/Pain Pain Assessment: 0-10 Pain Score: 6  Pain Location: back Pain Descriptors / Indicators: Sore Pain Intervention(s): Limited activity within patient's tolerance;Monitored during session;Repositioned;Premedicated before session    Home Living  Prior Function            PT Goals (current goals can now be found in the care plan section) Acute Rehab PT Goals Patient Stated Goal: home PT Goal Formulation: With patient Time For Goal Achievement: 12/27/19 Potential to Achieve Goals: Good Progress towards  PT goals: Progressing toward goals    Frequency    Min 3X/week      PT Plan Current plan remains appropriate    Co-evaluation              AM-PAC PT "6 Clicks" Mobility   Outcome Measure  Help needed turning from your back to your side while in a flat bed without using bedrails?: A Little Help needed moving from lying on your back to sitting on the side of a flat bed without using bedrails?: A Little Help needed moving to and from a bed to a chair (including a wheelchair)?: A Little Help needed standing up from a chair using your arms (e.g., wheelchair or bedside chair)?: A Little Help needed to walk in hospital room?: A Little Help needed climbing 3-5 steps with a railing? : A Little 6 Click Score: 18    End of Session   Activity Tolerance: Patient tolerated treatment well Patient left: in chair;with call bell/phone within reach;with nursing/sitter in room (at sink for washup with NT) Nurse Communication: Mobility status PT Visit Diagnosis: Other abnormalities of gait and mobility (R26.89);Other symptoms and signs involving the nervous system (R29.898)     Time: 8250-0370 PT Time Calculation (min) (ACUTE ONLY): 14 min  Charges:  $Gait Training: 8-22 mins                     Nitika Jackowski E, PT Acute Rehabilitation Services Pager [phone]  Office [phone]   Marius Betts D Despina Hidden 12/17/2019, 9:27 AM

## 2019-12-17 NOTE — Plan of Care (Signed)
  Problem: Education: Goal: Knowledge of disease or condition will improve Outcome: Adequate for Discharge Goal: Understanding of discharge needs will improve Outcome: Adequate for Discharge   Problem: Health Behavior/Discharge Planning: Goal: Ability to identify changes in lifestyle to reduce recurrence of condition will improve Outcome: Adequate for Discharge Goal: Identification of resources available to assist in meeting health care needs will improve Outcome: Adequate for Discharge   Problem: Physical Regulation: Goal: Complications related to the disease process, condition or treatment will be avoided or minimized Outcome: Adequate for Discharge   Problem: Safety: Goal: Ability to remain free from injury will improve Outcome: Adequate for Discharge   Problem: Clinical Measurements: Goal: Ability to maintain clinical measurements within normal limits will improve Outcome: Adequate for Discharge Goal: Will remain free from infection Outcome: Adequate for Discharge Goal: Diagnostic test results will improve Outcome: Adequate for Discharge Goal: Respiratory complications will improve Outcome: Adequate for Discharge Goal: Cardiovascular complication will be avoided Outcome: Adequate for Discharge   Problem: Nutrition: Goal: Adequate nutrition will be maintained Outcome: Adequate for Discharge   Problem: Coping: Goal: Level of anxiety will decrease Outcome: Adequate for Discharge   Problem: Pain Managment: Goal: General experience of comfort will improve Outcome: Adequate for Discharge

## 2019-12-17 NOTE — Discharge Instructions (Signed)
Follow with Primary MD Nonnie Done., MD in 7 days   Get CBC, CMP, 2 view Chest X ray -  checked next visit within 1 week by Primary MD    Activity: As tolerated with Full fall precautions use walker/cane & assistance as needed  Disposition Home    Diet: Heart Healthy   Special Instructions: If you have smoked or chewed Tobacco  in the last 2 yrs please stop smoking, stop any regular Alcohol  and or any Recreational drug use.  On your next visit with your primary care physician please Get Medicines reviewed and adjusted.  Please request your Prim.MD to go over all Hospital Tests and Procedure/Radiological results at the follow up, please get all Hospital records sent to your Prim MD by signing hospital release before you go home.  If you experience worsening of your admission symptoms, develop shortness of breath, life threatening emergency, suicidal or homicidal thoughts you must seek medical attention immediately by calling 911 or calling your MD immediately  if symptoms less severe.  You Must read complete instructions/literature along with all the possible adverse reactions/side effects for all the Medicines you take and that have been prescribed to you. Take any new Medicines after you have completely understood and accpet all the possible adverse reactions/side effects.

## 2019-12-17 NOTE — Discharge Summary (Signed)
Connor Vaughn:[id number] DOB: 01-26-1961 DOA: 12/11/2019  PCP: Nonnie Done., MD  Admit date: 12/11/2019  Discharge date: 12/17/2019  Admitted From: Home  Disposition:  Home   Recommendations for Outpatient Follow-up:   Follow up with PCP in 1-2 weeks  PCP Please obtain BMP/CBC, 2 view CXR in 1week,  (see Discharge instructions)   PCP Please follow up on the following pending results: ZMonitorCBC, CMP and glycemic control closely.   Home Health: None Equipment/Devices: Agricultural consultant  Consultations: N. Surg Discharge Condition: Stable    CODE STATUS: Full    Diet Recommendation: Heart Healthy-  Low Carb    Chief Complaint  Patient presents with  . Hypotension  . Loss of Consciousness     Brief history of present illness from the day of admission and additional interim summary    This is a 59 year old male with history of alcohol abuse, HCVs/ptreatment, frequent syncopal episodes, type 2 diabetes, depression/anxiety, cirrhosis, chronic back pain, hypertension and was recently started on losartan who presented to the ED on 7/21 from home following multiple episodes of syncope within a 30-minute time span                                                                 Hospital Course   1.  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  Does have mild left leg weakness at times upon exertion, so seems to have C6-C7 disc protrusion with some stenosis, TSH stable, B12 low normal, going PT OT, seen by neurosurgeon and he underwent L3-S1 open lami with left L4-5 discectomy on 12/15/2019.  Tolerated surgery well cleared by neurosurgeon for home discharge,  will be discharged home with outpatient PCP and neurosurgery follow-up.  2.  Loss of consciousness.  This was after mechanical fall when he hit his head on the floor.  No seizure-like activity, no preceding symptoms, Head CT nonacute, no headache or focal deficits.  Echocardiogram shows preserved EF of 60% with mild AS, PT OT and monitor.  3.  Back pain.  Chronic.  Supportive care and as in #1 above.   4.  AKI on CKD 2.  Baseline creatinine around 1.3, likely due to dehydration from home dose diuretics.  Offending medications held, dehydration AKI has resolved and now close to baseline, will be discharged on half home dose of diuretics, continue to hold ARB.  PCP to monitor and adjust.  5.  Alcohol use.  Patient had alcohol levels upon presentation however on detailed interview he says he only drinks once or twice every 2 weeks.  PCP to monitor no signs of DTs.  6.  History of alcoholic and hep C cirrhosis -  Hep C  has been treated, now claims to have quit alcohol for several months, has chronic thrombocytopenia, outpatient GI follow-up.  It is post platelet transfusion prior to surgery on 12/15/2019, platelet count stable.  7. Dehydration and hypotension.  Stable echocardiogram, blood pressure and orthostatics have resolved after IV fluids and TED stockings.  Adjusted discharge medications and cut down diuretics to have him discontinued ARB.  CP to monitor and adjust.  8.  Essential hypertension blood placed on beta-blocker instead of ARB due to AKI on admission.   9.  DM type II.    Continue home regimen A1c was 6.6.    Discharge diagnosis     Active Problems:   Syncope   AKI (acute kidney injury) (HCC)   Neurogenic claudication due to lumbar spinal stenosis    Discharge instructions    Discharge Instructions    Discharge instructions   Complete by: As directed    Follow with Primary MD Nonnie Done., MD in 7 days   Get CBC, CMP, 2 view Chest X ray -  checked next visit  within 1 week by Primary MD    Activity: As tolerated with Full fall precautions use walker/cane & assistance as needed  Disposition Home    Diet: Heart Healthy Low Carb  Special Instructions: If you have smoked or chewed Tobacco  in the last 2 yrs please stop smoking, stop any regular Alcohol  and or any Recreational drug use.  On your next visit with your primary care physician please Get Medicines reviewed and adjusted.  Please request your Prim.MD to go over all Hospital Tests and Procedure/Radiological results at the follow up, please get all Hospital records sent to your Prim MD by signing hospital release before you go home.  If you experience worsening of your admission symptoms, develop shortness of breath, life threatening emergency, suicidal or homicidal thoughts you must seek medical attention immediately by calling 911 or calling your MD immediately  if symptoms less severe.  You Must read complete instructions/literature along with all the possible adverse reactions/side effects for all the Medicines you take and that have been prescribed to you. Take any new Medicines after you have completely understood and accpet all the possible adverse reactions/side effects.   Incentive spirometry RT   Complete by: As directed    Increase activity slowly   Complete by: As directed    Leave dressing on - Keep it clean, dry, and intact until clinic visit   Complete by: As directed       Discharge Medications   Allergies as of 12/17/2019      Reactions   Latex Rash      Medication List    STOP taking these medications   gabapentin 600 MG tablet Commonly known as: NEURONTIN Replaced by: gabapentin 300 MG capsule   losartan 50 MG tablet Commonly known as: COZAAR   omeprazole 40 MG capsule Commonly known as: PriLOSEC     TAKE these medications   acetaminophen 325 MG tablet Commonly known as: TYLENOL Take 2 tablets (650 mg total) by mouth every 4 (four) hours as needed  for mild pain, moderate pain or fever ((score 1 to 3) or temp > 100.5).   busPIRone 10 MG tablet Commonly known as: BUSPAR Take 10 mg by mouth 3 (three) times daily.   Cymbalta 60 MG capsule Generic drug: DULoxetine Take 60 mg by mouth daily.   furosemide 40 MG tablet Commonly known as: LASIX Take 1 tablet (40  mg total) by mouth daily. What changed: how much to take   gabapentin 300 MG capsule Commonly known as: NEURONTIN Take 1 capsule (300 mg total) by mouth 2 (two) times daily. Replaces: gabapentin 600 MG tablet   glipiZIDE 10 MG tablet Commonly known as: GLUCOTROL Take 10 mg by mouth 2 (two) times daily.   HYDROcodone-acetaminophen 5-325 MG tablet Commonly known as: NORCO/VICODIN Take 1 tablet by mouth every 6 (six) hours as needed for severe pain.   hydrOXYzine 25 MG tablet Commonly known as: ATARAX/VISTARIL Take 25-50 mg by mouth every 6 (six) hours.   methocarbamol 500 MG tablet Commonly known as: ROBAXIN Take 1 tablet (500 mg total) by mouth every 8 (eight) hours as needed for muscle spasms.   metoprolol tartrate 50 MG tablet Commonly known as: LOPRESSOR Take 1 tablet (50 mg total) by mouth 2 (two) times daily.   spironolactone 25 MG tablet Commonly known as: ALDACTONE Take 1 tablet (25 mg total) by mouth daily. What changed:   medication strength  how much to take            Durable Medical Equipment  (From admission, onward)         Start     Ordered   12/16/19 1304  For home use only DME Walker rolling  Once       Question Answer Comment  Walker: With 5 Inch Wheels   Patient needs a walker to treat with the following condition Weakness      12/16/19 1304           Discharge Care Instructions  (From admission, onward)         Start     Ordered   12/17/19 0000  Leave dressing on - Keep it clean, dry, and intact until clinic visit        12/17/19 0915           Follow-up Information    [name], [name], MD. Schedule an  appointment as soon as possible for a visit in 1 week(s).   Specialty: Family Medicine Contact information: [address] [place] [address] [phone]        Dawley, Alan Mulder, DO. Schedule an appointment as soon as possible for a visit in 1 week(s).   Contact information: [address] [place] [address] [place] [address] [phone]               Major procedures and Radiology Reports - PLEASE review detailed and final reports thoroughly  -        X-ray chest PA and lateral  Result Date: 12/12/2019 CLINICAL DATA:  Syncope, orthostatic hypotension EXAM: CHEST - 2 VIEW COMPARISON:  12/03/2019 FINDINGS: Frontal and lateral views of the chest demonstrate an unremarkable cardiac silhouette. No airspace disease, effusion, or pneumothorax. No acute bony abnormalities. IMPRESSION: 1. No acute intrathoracic process. Electronically Signed   By: Sharlet Salina M.D.   On: 12/12/2019 02:01   CT HEAD WO CONTRAST  Result Date: 12/12/2019 CLINICAL DATA:  Syncope. EXAM: CT HEAD WITHOUT CONTRAST TECHNIQUE: Contiguous axial images were obtained from the base of the skull through the vertex without intravenous contrast. COMPARISON:  04/04/2019 FINDINGS: Brain: There is no evidence for acute hemorrhage, hydrocephalus, mass lesion, or abnormal extra-axial fluid collection. No definite CT evidence for acute infarction. Vascular: No hyperdense vessel or unexpected calcification. Skull: No evidence for fracture. No worrisome lytic or sclerotic lesion. Sinuses/Orbits: The visualized paranasal sinuses and mastoid air cells are clear.  Visualized portions of the globes and intraorbital fat are unremarkable. Other: None. IMPRESSION: Unremarkable exam.  No acute intracranial abnormality. Electronically Signed   By: Kennith Center M.D.   On: 12/12/2019 12:29   MR CERVICAL SPINE WO CONTRAST  Result Date: 12/13/2019 CLINICAL DATA:  Myelopathy, acute or progressive. Additional history provided by scanning  technologist: Patient reports neck pain intermittently for 2 years with intermittent numbness and tingling in hands, patient works as a Curator. EXAM: MRI CERVICAL SPINE WITHOUT CONTRAST TECHNIQUE: Multiplanar, multisequence MR imaging of the cervical spine was performed. No intravenous contrast was administered. COMPARISON:  CT cervical spine 05/12/2018 FINDINGS: Alignment: Reversal of the expected cervical lordosis. Minimal C3-C4 grade 1 anterolisthesis. Vertebrae: No significant osseous lesion or significant marrow edema. Cord: No definite spinal cord signal abnormality is identified. Posterior Fossa, vertebral arteries, paraspinal tissues: No abnormality identified within included portions of the posterior fossa. Flow voids preserved within the imaged cervical vertebral arteries. STIR hyperintensity within the C4-C5 interspinous space appears vascular in nature. Paraspinal soft tissues otherwise unremarkable. Disc levels: Mild diffuse disc degeneration throughout the cervical spine. Congenitally narrow cervical spinal canal. C2-C3: Shallow disc bulge. Right-sided uncinate hypertrophy. Facet hypertrophy. Mild relative spinal canal narrowing. Mild right neural foraminal narrowing. C3-C4: Minimal grade 1 retrolisthesis. Disc uncovering with disc bulge. Bilateral disc osteophyte ridge/uncinate hypertrophy. Facet hypertrophy. Mild/moderate spinal canal stenosis. Moderate right neural foraminal narrowing. C4-C5: Disc bulge. Uncinate/facet hypertrophy. Mild/moderate spinal canal stenosis. Bilateral neural foraminal narrowing (moderate/severe right, moderate left). C5-C6: Shallow central disc protrusion. Facet hypertrophy. Mild spinal canal stenosis. No significant foraminal narrowing. C6-C7: Disc bulge. Superimposed left center disc protrusion. Facet hypertrophy. There is moderate spinal canal stenosis to the left. The disc protrusion contacts and mildly flattens the left ventral aspect of the spinal cord.  Additionally, the disc protrusion has the potential to affect the exiting left C7 nerve root within the left foraminal entry zone. C7-T1: Facet and ligamentum flavum hypertrophy. No significant disc herniation or stenosis. IMPRESSION: Cervical spondylosis superimposed upon a congenitally narrow cervical spinal canal, as outlined and most notably as follows. At C6-C7, a left center disc protrusion contributes to moderate spinal canal stenosis to the left. Additionally, the disc protrusion contacts and mildly flattens the left ventral aspect of the spinal cord. Additionally, the disc protrusion has the potential to affect the exiting left C7 nerve root within the left foraminal entry zone. No more than mild spinal canal stenosis at the remaining levels. Additional sites of neural foraminal narrowing as described and greatest bilaterally at C4-C5 (moderate/severe right, moderate left). Also of note, there is moderate right neural foraminal narrowing at C3-C4. Electronically Signed   By: Jackey Loge DO   On: 12/13/2019 14:59   MR LUMBAR SPINE WO CONTRAST  Result Date: 12/12/2019 CLINICAL DATA:  Low back pain with progressive neurologic deficit. Fall. EXAM: MRI LUMBAR SPINE WITHOUT CONTRAST TECHNIQUE: Multiplanar, multisequence MR imaging of the lumbar spine was performed. No intravenous contrast was administered. COMPARISON:  Lumbar radiographs 12/03/2019 FINDINGS: Segmentation:  Normal Alignment:  Slight retrolisthesis L1-2, L2-3, L3-4, L4-5, L5-S1 Vertebrae:  Normal bone marrow.  Negative for fracture or mass. Conus medullaris and cauda equina: Conus extends to the L1-2 level. Conus and cauda equina appear normal. Paraspinal and other soft tissues: Negative for paraspinous mass or adenopathy. Disc levels: L1-2: Mild disc degeneration and disc bulging. Left lateral osteophyte formation. Negative for spinal or foraminal stenosis L2-3: Mild disc degeneration and disc bulging. Mild facet hypertrophy bilaterally. Mild  spinal stenosis and  mild subarticular stenosis bilaterally L3-4: Disc degeneration with diffuse disc bulging and endplate spurring. Shallow central disc protrusion. Mild facet hypertrophy bilaterally. Moderate spinal stenosis and moderate subarticular stenosis bilaterally L4-5: Disc degeneration with diffuse disc bulging and endplate spurring. Large central disc protrusion with downgoing disc material causing compression of thecal sac and severe spinal stenosis. Moderate facet and ligamentum flavum hypertrophy contributes to stenosis. There is severe subarticular stenosis bilaterally L5-S1: Disc degeneration with prominent diffuse endplate osteophyte formation. Superimposed central disc protrusion with downgoing disc material. Moderate spinal stenosis and moderate subarticular and foraminal stenosis bilaterally. Bilateral facet hypertrophy. IMPRESSION: Mild spinal stenosis L2-3.  Moderate spinal stenosis L3-4 Severe spinal stenosis L4-5 with severe subarticular stenosis bilaterally. Large central disc protrusion Disc degeneration and prominent spurring L5-S1 with superimposed central disc protrusion. Moderate spinal stenosis and moderate subarticular and foraminal stenosis bilaterally L5-S1. Electronically Signed   By: Marlan Palau M.D.   On: 12/12/2019 11:24   DG Lumbar Spine 1 View  Result Date: 12/15/2019 CLINICAL DATA:  L3-S1 laminectomy with L4-5 discectomy. EXAM: DG C-ARM 1-60 MIN; LUMBAR SPINE - 1 VIEW CONTRAST:  None. FLUOROSCOPY TIME:  Fluoroscopy Time:  0 minutes 2 seconds Radiation Exposure Index (if provided by the fluoroscopic device): 2.07 mGy Number of Acquired Spot Images: 1 COMPARISON:  MRI 12/12/2019 FINDINGS: Vertebral body alignment and heights are normal. Mild spondylosis of the lumbar spine. Mild disc space narrowing at the L4-5 and L5-S1 levels. Surgical instrument is present over the posterior soft tissues at the L4 level between the L3 and L4 spinous processes. IMPRESSION: Surgical  instrumentation localization as described. Electronically Signed   By: Elberta Fortis M.D.   On: 12/15/2019 12:51   DG C-Arm 1-60 Min  Result Date: 12/15/2019 CLINICAL DATA:  L3-S1 laminectomy with L4-5 discectomy. EXAM: DG C-ARM 1-60 MIN; LUMBAR SPINE - 1 VIEW CONTRAST:  None. FLUOROSCOPY TIME:  Fluoroscopy Time:  0 minutes 2 seconds Radiation Exposure Index (if provided by the fluoroscopic device): 2.07 mGy Number of Acquired Spot Images: 1 COMPARISON:  MRI 12/12/2019 FINDINGS: Vertebral body alignment and heights are normal. Mild spondylosis of the lumbar spine. Mild disc space narrowing at the L4-5 and L5-S1 levels. Surgical instrument is present over the posterior soft tissues at the L4 level between the L3 and L4 spinous processes. IMPRESSION: Surgical instrumentation localization as described. Electronically Signed   By: Elberta Fortis M.D.   On: 12/15/2019 12:51   ECHOCARDIOGRAM COMPLETE  Result Date: 12/12/2019    ECHOCARDIOGRAM REPORT   Patient Name:   NYSIR FERGUSSON Date of Exam: 12/12/2019 Medical Rec #:  [id number]          Height:       66.0 in Accession #:    [id number]         Weight:       211.5 lb Date of Birth:  14-Nov-1960          BSA:          2.048 m Patient Age:    59 years           BP:           150/98 mmHg Patient Gender: M                  HR:           96 bpm. Exam Location:  Inpatient Procedure: 2D Echo Indications:    syncope  History:        Patient has prior  history of Echocardiogram examinations, most                 recent 12/25/2015. Risk Factors:Diabetes and Former Smoker.  Sonographer:    Celene SkeenVijay Shankar RDCS (AE) Referring Phys: [id number] DAVID MANUEL ORTIZ  Sonographer Comments: No parasternal window and Technically difficult study due to poor echo windows. Image acquisition challenging due to patient body habitus and Image acquisition challenging due to respiratory motion. IMPRESSIONS  1. Technically difficult study, very limited views. Left ventricular ejection fraction, by  estimation, is 60 to 65%. The left ventricle has grossly normal function. Left ventricular endocardial border not optimally defined to evaluate regional wall motion. Left ventricular diastolic function could not be evaluated.  2. Right ventricule is poorly visualized but appears systolic function is grossly normal. The right ventricular size appears mildly enlarged.  3. The mitral valve is normal in structure. No evidence of mitral valve regurgitation.  4. The aortic valve was not well visualized. Aortic valve regurgitation is not visualized. Aortic stenosis evaluation was limited given technically difficult study, but suspect mild AS (MG 10mmHg, Vmax 2.1 m/s) FINDINGS  Left Ventricle: Left ventricular ejection fraction, by estimation, is 60 to 65%. The left ventricle has normal function. Left ventricular endocardial border not optimally defined to evaluate regional wall motion. The left ventricular internal cavity size was normal in size. There is mild left ventricular hypertrophy. Left ventricular diastolic function could not be evaluated. Right Ventricle: The right ventricular size is mildly enlarged. Right vetricular wall thickness was not assessed. Right ventricular systolic function is normal. Left Atrium: Left atrial size was not well visualized. Right Atrium: Right atrial size was not well visualized. Pericardium: Trivial pericardial effusion is present. Mitral Valve: The mitral valve is normal in structure. No evidence of mitral valve regurgitation. Tricuspid Valve: The tricuspid valve is grossly normal. Tricuspid valve regurgitation is not demonstrated. Aortic Valve: The aortic valve was not well visualized. Aortic valve regurgitation is not visualized. Mild aortic stenosis is present. Aortic valve mean gradient measures 10.0 mmHg. Aortic valve peak gradient measures 16.8 mmHg. Pulmonic Valve: The pulmonic valve was not well visualized. Pulmonic valve regurgitation is not visualized. Aorta: The aortic root  was not well visualized. IAS/Shunts: The interatrial septum was not well visualized.   Diastology LV e' lateral: 9.46 cm/s  AORTIC VALVE AV Vmax:           205.00 cm/s AV Vmean:          155.000 cm/s AV VTI:            0.343 m AV Peak Grad:      16.8 mmHg AV Mean Grad:      10.0 mmHg LVOT Vmax:         80.00 cm/s LVOT Vmean:        48.200 cm/s LVOT VTI:          0.119 m LVOT/AV VTI ratio: 0.35  SHUNTS Systemic VTI: 0.12 m Epifanio Lescheshristopher Schumann MD Electronically signed by Epifanio Lescheshristopher Schumann MD Signature Date/Time: 12/12/2019/6:54:35 PM    Final     Micro Results     Recent Results (from the past 240 hour(s))  SARS Coronavirus 2 by RT PCR (hospital order, [facility]) Nasopharyngeal Nasopharyngeal Swab     Status: None   Collection Time: 12/12/19  1:00 AM   Specimen: Nasopharyngeal Swab  Result Value Ref Range Status   SARS Coronavirus 2 NEGATIVE NEGATIVE Final    Comment: (NOTE) SARS-CoV-2 target nucleic acids  are NOT DETECTED.  The SARS-CoV-2 RNA is generally detectable in upper and lower respiratory specimens during the acute phase of infection. The lowest concentration of SARS-CoV-2 viral copies this assay can detect is 250 copies / mL. A negative result does not preclude SARS-CoV-2 infection and should not be used as the sole basis for treatment or other patient management decisions.  A negative result may occur with improper specimen collection / handling, submission of specimen other than nasopharyngeal swab, presence of viral mutation(s) within the areas targeted by this assay, and inadequate number of viral copies (<250 copies / mL). A negative result must be combined with clinical observations, patient history, and epidemiological information.  Fact Sheet for Patients:   BoilerBrush.com.cy  Fact Sheet for Healthcare Providers: https://pope.com/  This test is not yet approved or  cleared by the [place] FDA and has been authorized for detection and/or diagnosis of SARS-CoV-2 by FDA under an Emergency Use Authorization (EUA).  This EUA will remain in effect (meaning this test can be used) for the duration of the COVID-19 declaration under Section 564(b)(1) of the Act, 21 U.S.C. section 360bbb-3(b)(1), unless the authorization is terminated or revoked sooner.  Performed at [facility], [address] [place], [place] [address]   Surgical pcr screen     Status: None   Collection Time: 12/14/19  5:41 PM   Specimen: Nasal Mucosa; Nasal Swab  Result Value Ref Range Status   MRSA, PCR NEGATIVE NEGATIVE Final   Staphylococcus aureus NEGATIVE NEGATIVE Final    Comment: (NOTE) The Xpert SA Assay (FDA approved for NASAL specimens in patients 84 years of age and older), is one component of a comprehensive surveillance program. It is not intended to diagnose infection nor to guide or monitor treatment. Performed at [facility], [address], [place], [place] [address]     Today   Subjective    Jais Demir today has no headache,no chest abdominal pain,no new weakness tingling or numbness, feels much better wants to go home today.    Objective   Blood pressure 113/83, pulse 75, temperature 98.3 F (36.8 C), temperature source Oral, resp. rate 16, height  (1.676 m), weight (!) 94.8 kg, SpO2 96 %.   Intake/Output Summary (Last 24 hours) at 12/17/2019 0916 Last data filed at 12/17/2019 0900 Gross per 24 hour  Intake 360 ml  Output 1625 ml  Net -1265 ml    Exam  Awake Alert, No new F.N deficits, Normal affect [place].AT,PERRAL Supple Neck,No JVD, No cervical lymphadenopathy appriciated.  Symmetrical Chest wall movement, Good air movement bilaterally, CTAB RRR,No Gallops,Rubs or new Murmurs, No Parasternal Heave +ve B.Sounds, Abd Soft, Non tender, No organomegaly appriciated, No rebound -guarding or rigidity. No Cyanosis, Clubbing or edema,  No new Rash or bruise, L spine drain in place.   Data Review   CBC w Diff:  Lab Results  Component Value Date   WBC 4.8 12/17/2019   HGB 10.3 (L) 12/17/2019   HCT 32.4 (L) 12/17/2019   PLT 82 (L) 12/17/2019   LYMPHOPCT 28 12/17/2019   MONOPCT 14 12/17/2019   EOSPCT 2 12/17/2019   BASOPCT 1 12/17/2019    CMP:  Lab Results  Component Value Date   NA 139 12/17/2019   K 4.6 12/17/2019   CL 105 12/17/2019   CO2 29 12/17/2019   BUN 16 12/17/2019   CREATININE 1.21 12/17/2019   PROT 5.5 (L) 12/17/2019   ALBUMIN 3.1 (L) 12/17/2019  BILITOT 0.6 12/17/2019   ALKPHOS 64 12/17/2019   AST 38 12/17/2019   ALT 27 12/17/2019  .   Total Time in preparing paper work, data evaluation and todays exam - 35 minutes  Susa Raring M.D on 12/17/2019 at 9:16 AM  [facility]   Office  [phone]

## 2019-12-17 NOTE — Progress Notes (Signed)
Pt is AOx4. VS per flowsheet. Denied SOB. Complained of surgical pain. PRN Norco given with positive effect. On tele. On room air. Hemovac site CDI. Pt accidentally disconnect tube from hemovac. Attending was made aware. Neurosurgery was contacted. Per Neurosurgery to reconnect. Hemovac was reconnected. Pt D/C planned for today. See flowsheet for full assessment. Discharge education provided. Pt verbalized understanding. Pt left via wheelchair with all of pt's belongings/walker and in stable condition.

## 2019-12-22 DIAGNOSIS — K922 Gastrointestinal hemorrhage, unspecified: Secondary | ICD-10-CM

## 2019-12-22 HISTORY — DX: Gastrointestinal hemorrhage, unspecified: K92.2

## 2020-01-13 ENCOUNTER — Other Ambulatory Visit: Payer: Self-pay

## 2020-01-13 ENCOUNTER — Encounter (HOSPITAL_COMMUNITY)
Admission: EM | Disposition: A | Discharge status: Home / Self Care | Payer: Self-pay | Source: Home / Self Care | Attending: Family Medicine

## 2020-01-13 ENCOUNTER — Inpatient Hospital Stay (HOSPITAL_COMMUNITY)
Admission: EM | Admit: 2020-01-13 | Discharge: 2020-01-18 | DRG: 432 | Disposition: A | Discharge status: Home / Self Care | Payer: Medicaid Other | Attending: Family Medicine | Admitting: Family Medicine

## 2020-01-13 ENCOUNTER — Encounter (HOSPITAL_COMMUNITY): Payer: Self-pay

## 2020-01-13 DIAGNOSIS — Z7984 Long term (current) use of oral hypoglycemic drugs: Secondary | ICD-10-CM

## 2020-01-13 DIAGNOSIS — K269 Duodenal ulcer, unspecified as acute or chronic, without hemorrhage or perforation: Secondary | ICD-10-CM | POA: Diagnosis present

## 2020-01-13 DIAGNOSIS — M545 Low back pain: Secondary | ICD-10-CM | POA: Diagnosis present

## 2020-01-13 DIAGNOSIS — R0789 Other chest pain: Secondary | ICD-10-CM | POA: Diagnosis present

## 2020-01-13 DIAGNOSIS — N179 Acute kidney failure, unspecified: Secondary | ICD-10-CM | POA: Diagnosis present

## 2020-01-13 DIAGNOSIS — N189 Chronic kidney disease, unspecified: Secondary | ICD-10-CM

## 2020-01-13 DIAGNOSIS — G8929 Other chronic pain: Secondary | ICD-10-CM | POA: Diagnosis present

## 2020-01-13 DIAGNOSIS — N50819 Testicular pain, unspecified: Secondary | ICD-10-CM | POA: Diagnosis present

## 2020-01-13 DIAGNOSIS — K7011 Alcoholic hepatitis with ascites: Secondary | ICD-10-CM | POA: Diagnosis present

## 2020-01-13 DIAGNOSIS — B1920 Unspecified viral hepatitis C without hepatic coma: Secondary | ICD-10-CM | POA: Diagnosis present

## 2020-01-13 DIAGNOSIS — K3189 Other diseases of stomach and duodenum: Secondary | ICD-10-CM | POA: Diagnosis present

## 2020-01-13 DIAGNOSIS — K920 Hematemesis: Secondary | ICD-10-CM

## 2020-01-13 DIAGNOSIS — R55 Syncope and collapse: Secondary | ICD-10-CM | POA: Diagnosis present

## 2020-01-13 DIAGNOSIS — F329 Major depressive disorder, single episode, unspecified: Secondary | ICD-10-CM | POA: Diagnosis present

## 2020-01-13 DIAGNOSIS — K7031 Alcoholic cirrhosis of liver with ascites: Secondary | ICD-10-CM | POA: Diagnosis present

## 2020-01-13 DIAGNOSIS — N289 Disorder of kidney and ureter, unspecified: Secondary | ICD-10-CM

## 2020-01-13 DIAGNOSIS — F419 Anxiety disorder, unspecified: Secondary | ICD-10-CM | POA: Diagnosis present

## 2020-01-13 DIAGNOSIS — K2101 Gastro-esophageal reflux disease with esophagitis, with bleeding: Secondary | ICD-10-CM | POA: Diagnosis present

## 2020-01-13 DIAGNOSIS — Z833 Family history of diabetes mellitus: Secondary | ICD-10-CM | POA: Diagnosis not present

## 2020-01-13 DIAGNOSIS — I129 Hypertensive chronic kidney disease with stage 1 through stage 4 chronic kidney disease, or unspecified chronic kidney disease: Secondary | ICD-10-CM | POA: Diagnosis present

## 2020-01-13 DIAGNOSIS — I8511 Secondary esophageal varices with bleeding: Secondary | ICD-10-CM | POA: Diagnosis present

## 2020-01-13 DIAGNOSIS — E1122 Type 2 diabetes mellitus with diabetic chronic kidney disease: Secondary | ICD-10-CM | POA: Diagnosis present

## 2020-01-13 DIAGNOSIS — Z20822 Contact with and (suspected) exposure to covid-19: Secondary | ICD-10-CM | POA: Diagnosis present

## 2020-01-13 DIAGNOSIS — R079 Chest pain, unspecified: Secondary | ICD-10-CM | POA: Diagnosis not present

## 2020-01-13 DIAGNOSIS — I6523 Occlusion and stenosis of bilateral carotid arteries: Secondary | ICD-10-CM | POA: Diagnosis present

## 2020-01-13 DIAGNOSIS — D696 Thrombocytopenia, unspecified: Secondary | ICD-10-CM | POA: Diagnosis not present

## 2020-01-13 DIAGNOSIS — F10230 Alcohol dependence with withdrawal, uncomplicated: Secondary | ICD-10-CM | POA: Diagnosis not present

## 2020-01-13 DIAGNOSIS — Z6834 Body mass index (BMI) 34.0-34.9, adult: Secondary | ICD-10-CM

## 2020-01-13 DIAGNOSIS — Z91040 Latex allergy status: Secondary | ICD-10-CM

## 2020-01-13 DIAGNOSIS — K766 Portal hypertension: Secondary | ICD-10-CM | POA: Diagnosis present

## 2020-01-13 DIAGNOSIS — F1020 Alcohol dependence, uncomplicated: Secondary | ICD-10-CM | POA: Diagnosis present

## 2020-01-13 DIAGNOSIS — Z23 Encounter for immunization: Secondary | ICD-10-CM | POA: Diagnosis not present

## 2020-01-13 DIAGNOSIS — E118 Type 2 diabetes mellitus with unspecified complications: Secondary | ICD-10-CM | POA: Diagnosis present

## 2020-01-13 DIAGNOSIS — R000 Tachycardia, unspecified: Secondary | ICD-10-CM | POA: Diagnosis present

## 2020-01-13 DIAGNOSIS — N1831 Chronic kidney disease, stage 3a: Secondary | ICD-10-CM | POA: Diagnosis present

## 2020-01-13 DIAGNOSIS — N183 Chronic kidney disease, stage 3 unspecified: Secondary | ICD-10-CM

## 2020-01-13 DIAGNOSIS — Z8249 Family history of ischemic heart disease and other diseases of the circulatory system: Secondary | ICD-10-CM

## 2020-01-13 DIAGNOSIS — E876 Hypokalemia: Secondary | ICD-10-CM | POA: Diagnosis present

## 2020-01-13 DIAGNOSIS — D62 Acute posthemorrhagic anemia: Secondary | ICD-10-CM | POA: Diagnosis present

## 2020-01-13 DIAGNOSIS — Z87891 Personal history of nicotine dependence: Secondary | ICD-10-CM

## 2020-01-13 DIAGNOSIS — N5089 Other specified disorders of the male genital organs: Secondary | ICD-10-CM | POA: Diagnosis present

## 2020-01-13 DIAGNOSIS — Z79899 Other long term (current) drug therapy: Secondary | ICD-10-CM

## 2020-01-13 DIAGNOSIS — E669 Obesity, unspecified: Secondary | ICD-10-CM | POA: Diagnosis present

## 2020-01-13 DIAGNOSIS — K922 Gastrointestinal hemorrhage, unspecified: Secondary | ICD-10-CM

## 2020-01-13 HISTORY — PX: ESOPHAGOGASTRODUODENOSCOPY: SHX5428

## 2020-01-13 HISTORY — DX: Sleep apnea, unspecified: G47.30

## 2020-01-13 LAB — COMPREHENSIVE METABOLIC PANEL
ALT: 38 U/L (ref 0–44)
AST: 52 U/L — ABNORMAL HIGH (ref 15–41)
Albumin: 3.9 g/dL (ref 3.5–5.0)
Alkaline Phosphatase: 82 U/L (ref 38–126)
Anion gap: 20 — ABNORMAL HIGH (ref 5–15)
BUN: 46 mg/dL — ABNORMAL HIGH (ref 6–20)
CO2: 23 mmol/L (ref 22–32)
Calcium: 9.5 mg/dL (ref 8.9–10.3)
Chloride: 89 mmol/L — ABNORMAL LOW (ref 98–111)
Creatinine, Ser: 1.49 mg/dL — ABNORMAL HIGH (ref 0.61–1.24)
GFR calc Af Amer: 60 mL/min — ABNORMAL LOW (ref >60)
GFR calc non Af Amer: 51 mL/min — ABNORMAL LOW (ref >60)
Glucose, Bld: 303 mg/dL — ABNORMAL HIGH (ref 70–99)
Potassium: 3.9 mmol/L (ref 3.5–5.1)
Sodium: 132 mmol/L — ABNORMAL LOW (ref 135–145)
Total Bilirubin: 3.5 mg/dL — ABNORMAL HIGH (ref 0.3–1.2)
Total Protein: 6.6 g/dL (ref 6.5–8.1)

## 2020-01-13 LAB — CBC WITH DIFFERENTIAL/PLATELET
Abs Immature Granulocytes: 0.12 10*3/uL — ABNORMAL HIGH (ref 0.00–0.07)
Basophils Absolute: 0.1 10*3/uL (ref 0.0–0.1)
Basophils Relative: 0 %
Eosinophils Absolute: 0 10*3/uL (ref 0.0–0.5)
Eosinophils Relative: 0 %
HCT: 30 % — ABNORMAL LOW (ref 39.0–52.0)
Hemoglobin: 10.1 g/dL — ABNORMAL LOW (ref 13.0–17.0)
Immature Granulocytes: 1 %
Lymphocytes Relative: 15 %
Lymphs Abs: 2.9 10*3/uL (ref 0.7–4.0)
MCH: 28.3 pg (ref 26.0–34.0)
MCHC: 33.7 g/dL (ref 30.0–36.0)
MCV: 84 fL (ref 80.0–100.0)
Monocytes Absolute: 2 10*3/uL — ABNORMAL HIGH (ref 0.1–1.0)
Monocytes Relative: 10 %
Neutro Abs: 14.2 10*3/uL — ABNORMAL HIGH (ref 1.7–7.7)
Neutrophils Relative %: 74 %
Platelets: 222 10*3/uL (ref 150–400)
RBC: 3.57 MIL/uL — ABNORMAL LOW (ref 4.22–5.81)
RDW: 15.3 % (ref 11.5–15.5)
WBC: 19.2 10*3/uL — ABNORMAL HIGH (ref 4.0–10.5)
nRBC: 0 % (ref 0.0–0.2)

## 2020-01-13 LAB — CBC
HCT: 15.9 % — ABNORMAL LOW (ref 39.0–52.0)
Hemoglobin: 5.3 g/dL — CL (ref 13.0–17.0)
MCH: 23.6 pg — ABNORMAL LOW (ref 26.0–34.0)
MCHC: 33.3 g/dL (ref 30.0–36.0)
MCV: 70.7 fL — ABNORMAL LOW (ref 80.0–100.0)
Platelets: 87 10*3/uL — ABNORMAL LOW (ref 150–400)
RBC: 2.25 MIL/uL — ABNORMAL LOW (ref 4.22–5.81)
RDW: 19.6 % — ABNORMAL HIGH (ref 11.5–15.5)
WBC: 2.9 10*3/uL — ABNORMAL LOW (ref 4.0–10.5)
nRBC: 4.1 % — ABNORMAL HIGH (ref 0.0–0.2)

## 2020-01-13 LAB — PROTIME-INR
INR: 1.3 — ABNORMAL HIGH (ref 0.8–1.2)
Prothrombin Time: 15.5 seconds — ABNORMAL HIGH (ref 11.4–15.2)

## 2020-01-13 LAB — LIPASE, BLOOD: Lipase: 28 U/L (ref 11–51)

## 2020-01-13 LAB — PREPARE RBC (CROSSMATCH)

## 2020-01-13 LAB — CBG MONITORING, ED: Glucose-Capillary: 220 mg/dL — ABNORMAL HIGH (ref 70–99)

## 2020-01-13 LAB — SARS CORONAVIRUS 2 BY RT PCR (HOSPITAL ORDER, PERFORMED IN ~~LOC~~ HOSPITAL LAB): SARS Coronavirus 2: NEGATIVE

## 2020-01-13 SURGERY — EGD (ESOPHAGOGASTRODUODENOSCOPY)
Anesthesia: Moderate Sedation

## 2020-01-13 SURGERY — EGD (ESOPHAGOGASTRODUODENOSCOPY)
Anesthesia: Moderate Sedation | Laterality: Left

## 2020-01-13 MED ORDER — MIDAZOLAM HCL (PF) 10 MG/2ML IJ SOLN
INTRAMUSCULAR | Status: DC | PRN
  Administered 2020-01-13 (×4): 2 mg via INTRAVENOUS

## 2020-01-13 MED ORDER — SODIUM CHLORIDE 0.9 % IV SOLN
50.0000 ug/h | INTRAVENOUS | Status: DC
  Administered 2020-01-13 – 2020-01-15 (×6): 50 ug/h via INTRAVENOUS
  Filled 2020-01-13 (×11): qty 1

## 2020-01-13 MED ORDER — ADULT MULTIVITAMIN W/MINERALS CH
1.0000 | ORAL_TABLET | Freq: Every day | ORAL | Status: DC
  Administered 2020-01-14 – 2020-01-16 (×3): 1 via ORAL
  Filled 2020-01-13 (×3): qty 1

## 2020-01-13 MED ORDER — PNEUMOCOCCAL VAC POLYVALENT 25 MCG/0.5ML IJ INJ
0.5000 mL | INJECTION | INTRAMUSCULAR | Status: DC
  Filled 2020-01-13: qty 0.5

## 2020-01-13 MED ORDER — FENTANYL CITRATE (PF) 100 MCG/2ML IJ SOLN
INTRAMUSCULAR | Status: DC | PRN
Start: 2020-01-13 — End: 2020-01-13
  Administered 2020-01-13 (×4): 25 ug via INTRAVENOUS

## 2020-01-13 MED ORDER — BUTAMBEN-TETRACAINE-BENZOCAINE 2-2-14 % EX AERO
INHALATION_SPRAY | CUTANEOUS | Status: DC | PRN
  Administered 2020-01-13: 2 via TOPICAL

## 2020-01-13 MED ORDER — DULOXETINE HCL 60 MG PO CPEP
60.0000 mg | ORAL_CAPSULE | Freq: Every day | ORAL | Status: DC
  Administered 2020-01-13 – 2020-01-18 (×6): 60 mg via ORAL
  Filled 2020-01-13: qty 1
  Filled 2020-01-13: qty 2
  Filled 2020-01-13: qty 1
  Filled 2020-01-13: qty 2
  Filled 2020-01-13: qty 1

## 2020-01-13 MED ORDER — SODIUM CHLORIDE 0.9% IV SOLUTION: Freq: Once | INTRAVENOUS | Status: AC

## 2020-01-13 MED ORDER — ACETAMINOPHEN 325 MG PO TABS: 650.0000 mg | ORAL_TABLET | Freq: Four times a day (QID) | ORAL | Status: DC | PRN

## 2020-01-13 MED ORDER — MIDAZOLAM HCL (PF) 5 MG/ML IJ SOLN
INTRAMUSCULAR | Status: AC
  Filled 2020-01-13: qty 2

## 2020-01-13 MED ORDER — LORAZEPAM 1 MG PO TABS: 1.0000 mg | ORAL_TABLET | ORAL | Status: DC | PRN

## 2020-01-13 MED ORDER — FENTANYL CITRATE (PF) 100 MCG/2ML IJ SOLN
INTRAMUSCULAR | Status: AC
  Filled 2020-01-13: qty 4

## 2020-01-13 MED ORDER — DIPHENHYDRAMINE HCL 50 MG/ML IJ SOLN
INTRAMUSCULAR | Status: AC
  Filled 2020-01-13: qty 1

## 2020-01-13 MED ORDER — THIAMINE HCL 100 MG/ML IJ SOLN
100.0000 mg | Freq: Every day | INTRAMUSCULAR | Status: DC
  Administered 2020-01-14: 100 mg via INTRAVENOUS

## 2020-01-13 MED ORDER — ONDANSETRON HCL 4 MG/2ML IJ SOLN
4.0000 mg | Freq: Four times a day (QID) | INTRAMUSCULAR | Status: DC | PRN
  Administered 2020-01-14 – 2020-01-17 (×4): 4 mg via INTRAVENOUS
  Filled 2020-01-13 (×4): qty 2

## 2020-01-13 MED ORDER — LORAZEPAM 2 MG/ML IJ SOLN: 1.0000 mg | INTRAMUSCULAR | Status: DC | PRN

## 2020-01-13 MED ORDER — SODIUM CHLORIDE 0.9 % IV SOLN
8.0000 mg/h | INTRAVENOUS | Status: DC
  Administered 2020-01-13 – 2020-01-15 (×5): 8 mg/h via INTRAVENOUS
  Filled 2020-01-13 (×12): qty 80

## 2020-01-13 MED ORDER — SODIUM CHLORIDE 0.9 % IV SOLN: INTRAVENOUS | Status: AC

## 2020-01-13 MED ORDER — OCTREOTIDE LOAD VIA INFUSION
50.0000 ug | Freq: Once | INTRAVENOUS | Status: AC
  Administered 2020-01-13: 50 ug via INTRAVENOUS
  Filled 2020-01-13: qty 25

## 2020-01-13 MED ORDER — ONDANSETRON HCL 4 MG/2ML IJ SOLN
4.0000 mg | Freq: Once | INTRAMUSCULAR | Status: AC
  Administered 2020-01-13: 4 mg via INTRAVENOUS
  Filled 2020-01-13: qty 2

## 2020-01-13 MED ORDER — THIAMINE HCL 100 MG PO TABS
100.0000 mg | ORAL_TABLET | Freq: Every day | ORAL | Status: DC
  Administered 2020-01-14 – 2020-01-16 (×3): 100 mg via ORAL
  Filled 2020-01-13 (×3): qty 1

## 2020-01-13 MED ORDER — ACETAMINOPHEN 650 MG RE SUPP: 650.0000 mg | Freq: Four times a day (QID) | RECTAL | Status: DC | PRN

## 2020-01-13 MED ORDER — INSULIN ASPART 100 UNIT/ML ~~LOC~~ SOLN
0.0000 [IU] | SUBCUTANEOUS | Status: DC
  Administered 2020-01-13 – 2020-01-14 (×4): 3 [IU] via SUBCUTANEOUS
  Administered 2020-01-15: 5 [IU] via SUBCUTANEOUS
  Administered 2020-01-15 (×2): 2 [IU] via SUBCUTANEOUS
  Administered 2020-01-15 (×2): 3 [IU] via SUBCUTANEOUS
  Administered 2020-01-15 – 2020-01-16 (×2): 2 [IU] via SUBCUTANEOUS
  Administered 2020-01-16: 1 [IU] via SUBCUTANEOUS
  Administered 2020-01-16: 3 [IU] via SUBCUTANEOUS
  Administered 2020-01-16: 2 [IU] via SUBCUTANEOUS
  Administered 2020-01-16 – 2020-01-17 (×3): 3 [IU] via SUBCUTANEOUS
  Administered 2020-01-17: 5 [IU] via SUBCUTANEOUS
  Administered 2020-01-17: 3 [IU] via SUBCUTANEOUS
  Administered 2020-01-17: 2 [IU] via SUBCUTANEOUS
  Administered 2020-01-17: 3 [IU] via SUBCUTANEOUS
  Administered 2020-01-17: 2 [IU] via SUBCUTANEOUS
  Administered 2020-01-18: 5 [IU] via SUBCUTANEOUS
  Administered 2020-01-18 (×3): 2 [IU] via SUBCUTANEOUS
  Filled 2020-01-13: qty 0.09

## 2020-01-13 MED ORDER — ONDANSETRON HCL 4 MG PO TABS
4.0000 mg | ORAL_TABLET | Freq: Four times a day (QID) | ORAL | Status: DC | PRN
  Administered 2020-01-18: 4 mg via ORAL
  Filled 2020-01-13 (×2): qty 1

## 2020-01-13 MED ORDER — GABAPENTIN 300 MG PO CAPS
300.0000 mg | ORAL_CAPSULE | Freq: Two times a day (BID) | ORAL | Status: DC
  Administered 2020-01-13 – 2020-01-18 (×10): 300 mg via ORAL
  Filled 2020-01-13 (×10): qty 1

## 2020-01-13 MED ORDER — HYDROCODONE-ACETAMINOPHEN 5-325 MG PO TABS
1.0000 | ORAL_TABLET | ORAL | Status: DC | PRN
  Administered 2020-01-14: 1 via ORAL
  Administered 2020-01-15 (×2): 2 via ORAL
  Administered 2020-01-15 (×3): 1 via ORAL
  Administered 2020-01-16 – 2020-01-18 (×6): 2 via ORAL
  Filled 2020-01-13: qty 2
  Filled 2020-01-13 (×3): qty 1
  Filled 2020-01-13 (×7): qty 2
  Filled 2020-01-13: qty 1

## 2020-01-13 MED ORDER — BUSPIRONE HCL 10 MG PO TABS
10.0000 mg | ORAL_TABLET | Freq: Three times a day (TID) | ORAL | Status: DC
  Administered 2020-01-13 – 2020-01-18 (×14): 10 mg via ORAL
  Filled 2020-01-13 (×15): qty 1

## 2020-01-13 MED ORDER — METHOCARBAMOL 500 MG PO TABS
500.0000 mg | ORAL_TABLET | Freq: Three times a day (TID) | ORAL | Status: DC | PRN
  Administered 2020-01-14 – 2020-01-18 (×6): 500 mg via ORAL
  Filled 2020-01-13 (×6): qty 1

## 2020-01-13 MED ORDER — FOLIC ACID 1 MG PO TABS
1.0000 mg | ORAL_TABLET | Freq: Every day | ORAL | Status: DC
  Administered 2020-01-14 – 2020-01-16 (×3): 1 mg via ORAL
  Filled 2020-01-13 (×3): qty 1

## 2020-01-13 MED ORDER — CIPROFLOXACIN IN D5W 400 MG/200ML IV SOLN
400.0000 mg | Freq: Two times a day (BID) | INTRAVENOUS | Status: DC
  Administered 2020-01-13 – 2020-01-15 (×5): 400 mg via INTRAVENOUS
  Filled 2020-01-13 (×6): qty 200

## 2020-01-13 MED ORDER — SODIUM CHLORIDE 0.9 % IV BOLUS
1000.0000 mL | Freq: Once | INTRAVENOUS | Status: AC
  Administered 2020-01-13: 1000 mL via INTRAVENOUS

## 2020-01-13 MED ORDER — PANTOPRAZOLE SODIUM 40 MG IV SOLR: 40.0000 mg | Freq: Two times a day (BID) | INTRAVENOUS | Status: DC

## 2020-01-13 MED ORDER — SODIUM CHLORIDE 0.9 % IV SOLN
80.0000 mg | Freq: Once | INTRAVENOUS | Status: AC
  Administered 2020-01-13: 18:00:00 80 mg via INTRAVENOUS
  Filled 2020-01-13: qty 80

## 2020-01-13 MED ORDER — SODIUM CHLORIDE 0.9% FLUSH
3.0000 mL | Freq: Two times a day (BID) | INTRAVENOUS | Status: DC
  Administered 2020-01-14 – 2020-01-18 (×7): 3 mL via INTRAVENOUS

## 2020-01-13 NOTE — ED Provider Notes (Signed)
[facility] EMERGENCY DEPARTMENT Provider Note  CSN: [id number] Arrival date & time: 01/13/20 1649    History Chief Complaint  Patient presents with  . Hematemesis    HPI  Connor Vaughn is a 59 y.o. male with history of alcoholic cirrhosis with HCV and known esophageal varices reports he began having some dry heaves last night with some mild pink emesis, but today began vomiting up large blood clots. He reports one episode of syncope, although he also has a history of same. He denies any chest pain, some abdominal cramping. He recently had a back surgery and has been taking hydrocodone, but today took some ibuprofen. He does not take NSAIDs on a regular basis. No longer drinks alcohol.   Past Medical History:  Diagnosis Date  . Cirrhosis (HCC)   . Diabetes mellitus, type II (HCC)   . DKA (diabetic ketoacidoses) (HCC) 04/04/2019  . H/O fracture    nasal x 2    Past Surgical History:  Procedure Laterality Date  . ESOPHAGOGASTRODUODENOSCOPY (EGD) WITH PROPOFOL N/A 04/05/2019   Procedure: ESOPHAGOGASTRODUODENOSCOPY (EGD) WITH PROPOFOL;  Surgeon: Jeani Hawking, MD;  Location: [facility];  Service: Endoscopy;  Laterality: N/A;  . HERNIA REPAIR     Age 80  . LUMBAR LAMINECTOMY/DECOMPRESSION MICRODISCECTOMY N/A 12/15/2019   Procedure: LUMBAR THREE-SACRAL ONE LAMINECTOMY WITH LUMBAR FOUR-FIVE DISCECTOMY ;  Surgeon: Bethann Goo, DO;  Location: [facility] OR;  Service: Neurosurgery;  Laterality: N/A;  . thoracocenteis      Family History  Problem Relation Age of Onset  . Diabetes Mother   . Heart failure Mother   . Valvular heart disease Mother   . Diabetes Mellitus II Father   . Heart failure Father   . Dementia Father   . Seizures Neg Hx     Social History   Tobacco Use  . Smoking status: Former Smoker    Types: Cigarettes    Quit date: 2010    Years since quitting: 11.6  . Smokeless tobacco: Never Used  Vaping Use  . Vaping Use: Never used  Substance Use Topics   . Alcohol use: Yes    Comment: "pint every few days"  . Drug use: No     Home Medications Prior to Admission medications   Medication Sig Start Date End Date Taking? Authorizing Provider  acetaminophen (TYLENOL) 325 MG tablet Take 2 tablets (650 mg total) by mouth every 4 (four) hours as needed for mild pain, moderate pain or fever ((score 1 to 3) or temp > 100.5). 12/17/19   Leroy Sea, MD  busPIRone (BUSPAR) 10 MG tablet Take 10 mg by mouth 3 (three) times daily. 11/28/19   [provider]  CYMBALTA 60 MG capsule Take 60 mg by mouth daily. 11/28/19   [provider]  furosemide (LASIX) 40 MG tablet Take 1 tablet (40 mg total) by mouth daily. 12/17/19   Leroy Sea, MD  gabapentin (NEURONTIN) 300 MG capsule Take 1 capsule (300 mg total) by mouth 2 (two) times daily. 12/17/19   Leroy Sea, MD  glipiZIDE (GLUCOTROL) 10 MG tablet Take 10 mg by mouth 2 (two) times daily. 11/28/19   [provider]  HYDROcodone-acetaminophen (NORCO/VICODIN) 5-325 MG tablet Take 1 tablet by mouth every 6 (six) hours as needed for severe pain. 12/17/19 01/16/20  Dawley, Troy C, DO  hydrOXYzine (ATARAX/VISTARIL) 25 MG tablet Take 25-50 mg by mouth every 6 (six) hours. 11/28/19   [provider]  methocarbamol (ROBAXIN) 500 MG  tablet Take 1 tablet (500 mg total) by mouth every 8 (eight) hours as needed for muscle spasms. 05/12/18   Terrilee Files, MD  methocarbamol (ROBAXIN) 500 MG tablet Take 1 tablet (500 mg total) by mouth every 8 (eight) hours as needed for muscle spasms. 12/17/19 01/16/20  Dawley, Troy C, DO  metoprolol tartrate (LOPRESSOR) 50 MG tablet Take 1 tablet (50 mg total) by mouth 2 (two) times daily. 12/17/19   Leroy Sea, MD  spironolactone (ALDACTONE) 25 MG tablet Take 1 tablet (25 mg total) by mouth daily. 12/17/19   Leroy Sea, MD     Allergies    Latex   Review of Systems   Review of Systems A comprehensive review of systems was  completed and negative except as noted in HPI.    Physical Exam BP (!) 155/73 (BP Location: Left Arm)   Pulse (!) 112   Temp 97.7 F (36.5 C) (Oral)   Resp (!) 21   Ht 5\' 6"  (1.676 m)   Wt 90.7 kg   SpO2 100%   BMI 32.28 kg/m   Physical Exam Vitals and nursing note reviewed.  Constitutional:      Appearance: Normal appearance.  HENT:     Head: Normocephalic and atraumatic.     Nose: Nose normal.     Mouth/Throat:     Mouth: Mucous membranes are moist.  Eyes:     Extraocular Movements: Extraocular movements intact.     Conjunctiva/sclera: Conjunctivae normal.  Cardiovascular:     Rate and Rhythm: Regular rhythm. Tachycardia present.  Pulmonary:     Effort: Pulmonary effort is normal.     Breath sounds: Normal breath sounds.  Abdominal:     General: Abdomen is flat.     Palpations: Abdomen is soft.     Tenderness: There is no abdominal tenderness.  Musculoskeletal:        General: No swelling. Normal range of motion.     Cervical back: Neck supple.  Skin:    General: Skin is warm and dry.  Neurological:     General: No focal deficit present.     Mental Status: He is alert.  Psychiatric:        Mood and Affect: Mood normal.      ED Results / Procedures / Treatments   Labs (all labs ordered are listed, but only abnormal results are displayed) Labs Reviewed  COMPREHENSIVE METABOLIC PANEL - Abnormal; Notable for the following components:      Result Value   Sodium 132 (*)    Chloride 89 (*)    Glucose, Bld 303 (*)    BUN 46 (*)    Creatinine, Ser 1.49 (*)    AST 52 (*)    Total Bilirubin 3.5 (*)    GFR calc non Af Amer 51 (*)    GFR calc Af Amer 60 (*)    Anion gap 20 (*)    All other components within normal limits  PROTIME-INR - Abnormal; Notable for the following components:   Prothrombin Time 15.5 (*)    INR 1.3 (*)    All other components within normal limits  CBC WITH DIFFERENTIAL/PLATELET - Abnormal; Notable for the following components:   WBC  19.2 (*)    RBC 3.57 (*)    Hemoglobin 10.1 (*)    HCT 30.0 (*)    Neutro Abs 14.2 (*)    Monocytes Absolute 2.0 (*)    Abs Immature Granulocytes 0.12 (*)  All other components within normal limits  SARS CORONAVIRUS 2 BY RT PCR (HOSPITAL ORDER, PERFORMED IN  HOSPITAL LAB)  SARS CORONAVIRUS 2 (TAT 6-24 HRS)  LIPASE, BLOOD  CBC  CBC  URINALYSIS, ROUTINE W REFLEX MICROSCOPIC  MAGNESIUM  HEMOGLOBIN A1C  SODIUM, URINE, RANDOM  CREATININE, URINE, RANDOM  OSMOLALITY, URINE  TYPE AND SCREEN    EKG EKG Interpretation  Date/Time:  Monday January 13 2020 16:55:14 EDT Ventricular Rate:  108 PR Interval:    QRS Duration: 78 QT Interval:  358 QTC Calculation: 480 R Axis:   -41 Text Interpretation: Sinus tachycardia Inferior infarct, old No significant change since last tracing Confirmed by Susy Frizzle [phone]) on 01/13/2020 6:14:04 PM   Radiology No results found.  Procedures .Critical Care Performed by: Pollyann Savoy, MD Authorized by: Pollyann Savoy, MD   Critical care provider statement:    Critical care time (minutes):  35   Critical care time was exclusive of:  Separately billable procedures and treating other patients   Critical care was necessary to treat or prevent imminent or life-threatening deterioration of the following conditions:  Circulatory failure and shock   Critical care was time spent personally by me on the following activities:  Discussions with consultants, evaluation of patient's response to treatment, examination of patient, ordering and performing treatments and interventions, ordering and review of laboratory studies, ordering and review of radiographic studies, pulse oximetry, re-evaluation of patient's condition, obtaining history from patient or surrogate and review of old charts    Medications Ordered in the ED Medications  pantoprazole (PROTONIX) 80 mg in sodium chloride 0.9 % 100 mL (0.8 mg/mL) infusion (8 mg/hr  Intravenous New Bag/Given 01/13/20 1827)  pantoprazole (PROTONIX) injection 40 mg (has no administration in time range)  octreotide (SANDOSTATIN) 2 mcg/mL load via infusion 50 mcg (50 mcg Intravenous Bolus from Bag 01/13/20 1753)    And  octreotide (SANDOSTATIN) 500 mcg in sodium chloride 0.9 % 250 mL (2 mcg/mL) infusion (50 mcg/hr Intravenous New Bag/Given 01/13/20 1755)  insulin aspart (novoLOG) injection 0-9 Units (has no administration in time range)  pantoprazole (PROTONIX) 80 mg in sodium chloride 0.9 % 100 mL IVPB (0 mg Intravenous Stopped 01/13/20 1842)  ondansetron (ZOFRAN) injection 4 mg (4 mg Intravenous Given 01/13/20 1759)  sodium chloride 0.9 % bolus 1,000 mL (1,000 mLs Intravenous New Bag/Given (Non-Interop) 01/13/20 1759)     MDM Rules/Calculators/A&P MDM Patient with known esophageal varices here with large volume hematemesis. See photo of emesis basin from EMS in Media tab. Protonix and Octreotide drips initiated. GI consulted early. Will check labs including CBC, CMP and coags. Pt's BP now is normal.  ED Course  I have reviewed the triage vital signs and the nursing notes.  Pertinent labs & imaging results that were available during my care of the patient were reviewed by me and considered in my medical decision making (see chart for details).  Clinical Course as of Jan 12 1933  Mon Jan 13, 2020  1733 Spoke with Dr. Loreta Ave, on call for Dr. Elnoria Howard who is the patient's Gastroenterologist. She is aware of the patient and would like to be called back when his labs have resulted.    [CS]  1733 CBC shows mild anemia, not significantly changed from baseline. Will continue to monitor closely. INR is 1.3, mildly increased from previous.    [CS]  1738 CMP shows BUN/Cr both elevated above baseline. Glucose is mildly elevated with anion gap of 20, but normal bicarb.  Total Bili is also elevated compared to previous.    [CS]  1739 Lipase is normal.    [CS]  1816 Discussed again with Dr. Loreta AveMann  who will come evaluate the patient. Hospitalist paged for admission.    [CS]  1832 Spoke with Dr. Adela Glimpseoutova, Hospitalist, who will evaluate for admission.    [CS]    Clinical Course User Index [CS] Pollyann SavoySheldon, Kennya Schwenn B, MD    Final Clinical Impression(s) / ED Diagnoses Final diagnoses:  Upper GI bleed    Rx / DC Orders ED Discharge Orders    None       Pollyann SavoySheldon, Kyla Duffy B, MD 01/13/20 [phone]
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
## 2020-01-13 NOTE — ED Triage Notes (Signed)
59 yo male BIBA from home. C/O dizziness and hematemesis. Pt has history esophageal varices as well as liver disease. Pt is a diabetic. Per EMS pt was actively vomiting on scene, which appeared to be coffee ground consistency. Pt states unable to hold down any food or fluid x 24 hours. Per EMS pt had another episode of emesis en route which composed of clots.    Vitals: 138/86  Hr 100 RR 18 cbg 307 spo2 100 % on 2 L for comfort

## 2020-01-13 NOTE — Op Note (Addendum)
[facility] Patient Name: Connor Vaughn Procedure Date: 01/13/2020 MRN: [id number] Attending MD: Charna Elizabeth , MD Date of Birth: 28-Aug-1960 CSN: [id number] Age: 59 Admit Type: Emergency Department Procedure:                Diagnostic EGD. Indications:              Hematemesis, Melena and acute post hemorrhagic                            anemia. Providers:                Charna Elizabeth, MD Referring MD:             Dema Severin PA-C Medicines:                Fentanyl 100 micrograms IV, Midazolam 5 mg IV Complications:            No immediate complications. Estimated Blood Loss:     Estimated blood loss: none. Procedure:                Pre-Anesthesia Assessment: - Prior to the                            procedure, a history and physical was performed,                            and patient medications and allergies were                            reviewed. The patient's tolerance of previous                            anesthesia was also reviewed. The risks and                            benefits of the procedure and the sedation options                            and risks were discussed with the patient. All                            questions were answered, and informed consent was                            obtained. Prior Anticoagulants: The patient has                            taken no previous anticoagulant or antiplatelet                            agents. ASA Grade Assessment: III - A patient with                            severe systemic disease. After reviewing the risks  and benefits, the patient was deemed in                            satisfactory condition to undergo the procedure.                            After obtaining informed consent, the endoscope was                            passed under direct vision. Throughout the                            procedure, the patient's blood pressure, pulse, and                             oxygen saturations were monitored continuously.The                            GIF-H190 ([id number] Olympus gastroscope was                            introduced through the and advanced to the second                            part of duodenum. The EGD was accomplished without                            difficulty. The patient tolerated the procedure                            well. Findings:      Three columns of grade III, large (> 5 mm) varices with no stigmata of       recent bleeding were found in the middle third of the esophagus and in       the lower third of the esophagus-there was patchy ulceration/esophagitis       noted over the varices; no red wale signs were present; no intervention       was done as the exact source of bleeding was not clear to me.      Clotted blood was found in the cardia, in the gastric fundus with some       blood in the gastric body-gastric varices could not be ruled out.      Red blood was found in the duodenal bulb.      The first portion of the duodenum was normal. Impression:               - Grade III and large (> 5 mm) esophageal varices                            with patchy esophagitis noted from 30-40 cm-there                            was no stigmata of recent bleeding from the varices.                           -  Clotted blood in the cardia, in the gastric                            fundus and some blood in the gastric body-gastric                            varices could not be ruled out.                           - Blood in the duodenal bulb.                           - Normal first portion of the duodenum.                           - No specimens collected. Moderate Sedation:      Moderate (conscious) sedation was administered by the endoscopy nurse       and supervised by the endoscopist. The following parameters were       monitored: oxygen saturation, heart rate, blood pressure, respiratory       rate, EKG, adequacy of pulmonary  ventilation, and response to care.       Total physician intraservice time was 14 minutes. Recommendation:           - NPO.                           - Continue IV PPI's and Octerotide for now.                           - Repeat upper endoscopy tomorrow for further                            evaluation and treatment.                           - Return to GI office in 1 week post discharge. Procedure Code(s):        --- Professional ---                           [phone], Esophagogastroduodenoscopy, flexible,                            transoral; diagnostic, including collection of                            specimen(s) by brushing or washing, when performed                            (separate procedure) Diagnosis Code(s):        --- Professional ---                           K92.0, Hematemesis                           K92.1, Melena (includes Hematochezia)  D62, Acute posthemorrhagic anemia                           I85.01, Esophageal varices with bleeding CPT copyright 2019 [employer]. All rights reserved. The codes documented in this report are preliminary and upon coder review may  be revised to meet current compliance requirements. Charna Elizabeth, MD Charna Elizabeth, MD 01/13/2020 8:36:50 PM This report has been signed electronically. Number of Addenda: 0

## 2020-01-13 NOTE — ED Notes (Signed)
Assumed care of patient at this time, nad noted, sr up x2, bed locked and low, call bell w/I reach.  Will continue to monitor.  GI at the bedside to perform EGD at this time.

## 2020-01-13 NOTE — H&P (View-Only) (Signed)
Reason for Consult: Vomited blood several times since Sunday. Referring Physician: ER MD  Connor Vaughn is an 59 y.o. male.  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  He claims he was doing well and was in his baseline state of health when he started having a lot of dry heaving and belching Sunday evening after which she started vomiting blood.  He is thrown up blood several times and as per the ER physician Dr. Renae Gloss he vomited some blood clots as well.  Patient says he took 2 ibuprofen today hoping it would help her symptoms but has not taken any other nonsteroidals since he had his back surgery on 12/15/2019 when he had lumbar laminectomy with lumbar 4 5 discectomy done by his neurosurgeon Dr. Kendell Bane Dawley.  Patient denies having any abdominal pain.  He has had some melena over the last few hours.  He claims he has not been able to keep even liquids down for the last couple of days.  He claims he took his glipizide today but has not taken any of his other medications because of his constant nausea vomiting and hematemesis.   Past Medical History:  Diagnosis Date  . Cirrhosis (HCC)   . Diabetes mellitus, type II (HCC)   . DKA (diabetic ketoacidoses) (HCC) 04/04/2019  . H/O fracture    nasal x 2   Past Surgical History:  Procedure Laterality Date  . ESOPHAGOGASTRODUODENOSCOPY (EGD) WITH PROPOFOL N/A 04/05/2019   Procedure: ESOPHAGOGASTRODUODENOSCOPY (EGD) WITH PROPOFOL;  Surgeon: Jeani Hawking, MD;  Location: [facility];  Service: Endoscopy;  Laterality: N/A;  . HERNIA REPAIR     Age 65  . LUMBAR LAMINECTOMY/DECOMPRESSION MICRODISCECTOMY N/A 12/15/2019   Procedure: LUMBAR THREE-SACRAL ONE LAMINECTOMY WITH LUMBAR FOUR-FIVE DISCECTOMY ;  Surgeon: Bethann Goo, DO;  Location: [facility] OR;   Service: Neurosurgery;  Laterality: N/A;  . thoracocenteis     Family History  Problem Relation Age of Onset  . Diabetes Mother   . Heart failure Mother   . Valvular heart disease Mother   . Diabetes Mellitus II Father   . Heart failure Father   . Dementia Father   . Seizures Neg Hx    Social History:  reports that he quit smoking about 11 years ago. His smoking use included cigarettes. He has never used smokeless tobacco. He reports current alcohol use. He reports that he does not use drugs.  Allergies:  Allergies  Allergen Reactions  . Latex Rash   Medications: I have reviewed the patient's current medications.  Results for orders placed or performed during the hospital encounter of 01/13/20 (from the past 48 hour(s))  Comprehensive metabolic panel     Status: Abnormal   Collection Time: 01/13/20  4:58 PM  Result Value Ref Range   Sodium 132 (L) 135 - 145 mmol/L   Potassium 3.9 3.5 - 5.1 mmol/L   Chloride 89 (L) 98 - 111 mmol/L   CO2 23 22 - 32 mmol/L   Glucose, Bld 303 (H) 70 - 99 mg/dL    Comment: Glucose reference range applies only to samples taken after fasting for at least 8 hours.   BUN 46 (H) 6 - 20 mg/dL   Creatinine, Ser 9.03 (H) 0.61 - 1.24 mg/dL   Calcium  9.5 8.9 - 10.3 mg/dL   Total Protein 6.6 6.5 - 8.1 g/dL   Albumin 3.9 3.5 - 5.0 g/dL   AST 52 (H) 15 - 41 U/L   ALT 38 0 - 44 U/L   Alkaline Phosphatase 82 38 - 126 U/L   Total Bilirubin 3.5 (H) 0.3 - 1.2 mg/dL   GFR calc non Af Amer 51 (L) >60 mL/min   GFR calc Af Amer 60 (L) >60 mL/min   Anion gap 20 (H) 5 - 15    Comment: Performed at [facility] [address], [place], [place] [address]  Lipase, blood     Status: None   Collection Time: 01/13/20  4:58 PM  Result Value Ref Range   Lipase 28 11 - 51 U/L    Comment: Performed at [facility], [address] [place], [place] [address]  Protime-INR     Status: Abnormal   Collection Time: 01/13/20  4:58 PM   Result Value Ref Range   Prothrombin Time 15.5 (H) 11.4 - 15.2 seconds   INR 1.3 (H) 0.8 - 1.2    Comment: (NOTE) INR goal varies based on device and disease states. Performed at [facility], [address] [place], [place] [address]   CBC with Differential     Status: Abnormal   Collection Time: 01/13/20  4:58 PM  Result Value Ref Range   WBC 19.2 (H) 4.0 - 10.5 K/uL   RBC 3.57 (L) 4.22 - 5.81 MIL/uL   Hemoglobin 10.1 (L) 13.0 - 17.0 g/dL   HCT 20.2 (L) 39 - 52 %   MCV 84.0 80.0 - 100.0 fL   MCH 28.3 26.0 - 34.0 pg   MCHC 33.7 30.0 - 36.0 g/dL   RDW 54.2 70.6 - 23.7 %   Platelets 222 150 - 400 K/uL   nRBC 0.0 0.0 - 0.2 %   Neutrophils Relative % 74 %   Neutro Abs 14.2 (H) 1.7 - 7.7 K/uL   Lymphocytes Relative 15 %   Lymphs Abs 2.9 0.7 - 4.0 K/uL   Monocytes Relative 10 %   Monocytes Absolute 2.0 (H) 0 - 1 K/uL   Eosinophils Relative 0 %   Eosinophils Absolute 0.0 0 - 0 K/uL   Basophils Relative 0 %   Basophils Absolute 0.1 0 - 0 K/uL   Immature Granulocytes 1 %   Abs Immature Granulocytes 0.12 (H) 0.00 - 0.07 K/uL    Comment: Performed at [facility], [address] [place], [place] [address]  Type and screen [facility]     Status: None   Collection Time: 01/13/20  4:58 PM  Result Value Ref Range   ABO/RH(D) O POS    Antibody Screen NEG    Sample Expiration      01/16/2020,2359 Performed at [facility], [address] [place], [place] [address]   Review of Systems  Constitutional: Negative.   HENT: Negative.   Eyes: Negative.   Respiratory: Negative.   Cardiovascular: Negative.   Gastrointestinal: Positive for blood in stool. Negative for abdominal pain.  Endocrine: Negative.   Genitourinary: Negative.   Musculoskeletal: Positive for arthralgias and back pain. Negative for gait problem, joint swelling, myalgias, neck pain and neck stiffness.  Skin: Positive for pallor. Negative  for color change, rash and wound.  Allergic/Immunologic: Negative.   Neurological: Negative.   Hematological: Negative.   Psychiatric/Behavioral: Negative.    Blood pressure (!) 148/92,  pulse 99, temperature 97.7 F (36.5 C), temperature source Oral, resp. rate 17, height 5\' 6"  (1.676 m), weight 90.7 kg, SpO2 100 %. Physical Exam Vitals reviewed.  Constitutional:      Appearance: Normal appearance.  HENT:     Head: Normocephalic and atraumatic.     Nose: Nose normal.     Mouth/Throat:     Mouth: Mucous membranes are dry.  Pulmonary:     Effort: Pulmonary effort is normal.     Breath sounds: Normal breath sounds.  Abdominal:     General: Abdomen is flat.     Palpations: Abdomen is soft.  Musculoskeletal:        General: Normal range of motion.     Cervical back: Normal range of motion and neck supple.  Skin:    General: Skin is warm and dry.  Neurological:     General: No focal deficit present.     Mental Status: He is alert and oriented to person, place, and time.   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 2) Recent back surgery for severe spinal stenosis. 3) AODM. 4) Acute kidney injury on chronic kidney disease. 5) Hypertension. 6) Obesity.  01/13/2020, 6:56 PM

## 2020-01-13 NOTE — ED Notes (Signed)
Mod/lg black stool, soft nonformed

## 2020-01-13 NOTE — Consult Note (Signed)
Reason for Consult: Vomited blood several times since Sunday. Referring Physician: ER MD  Connor Vaughn is an 59 y.o. male.  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  He claims he was doing well and was in his baseline state of health when he started having a lot of dry heaving and belching Sunday evening after which she started vomiting blood.  He is thrown up blood several times and as per the ER physician Dr. Shelton he vomited some blood clots as well.  Patient says he took 2 ibuprofen today hoping it would help her symptoms but has not taken any other nonsteroidals since he had his back surgery on 12/15/2019 when he had lumbar laminectomy with lumbar 4 5 discectomy done by his neurosurgeon Dr. Troy Dawley.  Patient denies having any abdominal pain.  He has had some melena over the last few hours.  He claims he has not been able to keep even liquids down for the last couple of days.  He claims he took his glipizide today but has not taken any of his other medications because of his constant nausea vomiting and hematemesis.   Past Medical History:  Diagnosis Date  . Cirrhosis (HCC)   . Diabetes mellitus, type II (HCC)   . DKA (diabetic ketoacidoses) (HCC) 04/04/2019  . H/O fracture    nasal x 2   Past Surgical History:  Procedure Laterality Date  . ESOPHAGOGASTRODUODENOSCOPY (EGD) WITH PROPOFOL N/A 04/05/2019   Procedure: ESOPHAGOGASTRODUODENOSCOPY (EGD) WITH PROPOFOL;  Surgeon: Hung, Patrick, MD;  Location: [facility];  Service: Endoscopy;  Laterality: N/A;  . HERNIA REPAIR     Age 4  . LUMBAR LAMINECTOMY/DECOMPRESSION MICRODISCECTOMY N/A 12/15/2019   Procedure: LUMBAR THREE-SACRAL ONE LAMINECTOMY WITH LUMBAR FOUR-FIVE DISCECTOMY ;  Surgeon: Dawley, Troy C, DO;  Location: [facility] OR;   Service: Neurosurgery;  Laterality: N/A;  . thoracocenteis     Family History  Problem Relation Age of Onset  . Diabetes Mother   . Heart failure Mother   . Valvular heart disease Mother   . Diabetes Mellitus II Father   . Heart failure Father   . Dementia Father   . Seizures Neg Hx    Social History:  reports that he quit smoking about 11 years ago. His smoking use included cigarettes. He has never used smokeless tobacco. He reports current alcohol use. He reports that he does not use drugs.  Allergies:  Allergies  Allergen Reactions  . Latex Rash   Medications: I have reviewed the patient's current medications.  Results for orders placed or performed during the hospital encounter of 01/13/20 (from the past 48 hour(s))  Comprehensive metabolic panel     Status: Abnormal   Collection Time: 01/13/20  4:58 PM  Result Value Ref Range   Sodium 132 (L) 135 - 145 mmol/L   Potassium 3.9 3.5 - 5.1 mmol/L   Chloride 89 (L) 98 - 111 mmol/L   CO2 23 22 - 32 mmol/L   Glucose, Bld 303 (H) 70 - 99 mg/dL    Comment: Glucose reference range applies only to samples taken after fasting for at least 8 hours.   BUN 46 (H) 6 - 20 mg/dL   Creatinine, Ser 1.49 (H) 0.61 - 1.24 mg/dL   Calcium   9.5 8.9 - 10.3 mg/dL   Total Protein 6.6 6.5 - 8.1 g/dL   Albumin 3.9 3.5 - 5.0 g/dL   AST 52 (H) 15 - 41 U/L   ALT 38 0 - 44 U/L   Alkaline Phosphatase 82 38 - 126 U/L   Total Bilirubin 3.5 (H) 0.3 - 1.2 mg/dL   GFR calc non Af Amer 51 (L) >60 mL/min   GFR calc Af Amer 60 (L) >60 mL/min   Anion gap 20 (H) 5 - 15    Comment: Performed at [facility] [address], [place], [place] [address]  Lipase, blood     Status: None   Collection Time: 01/13/20  4:58 PM  Result Value Ref Range   Lipase 28 11 - 51 U/L    Comment: Performed at [facility], [address] [place], [place] [address]  Protime-INR     Status: Abnormal   Collection Time: 01/13/20  4:58 PM   Result Value Ref Range   Prothrombin Time 15.5 (H) 11.4 - 15.2 seconds   INR 1.3 (H) 0.8 - 1.2    Comment: (NOTE) INR goal varies based on device and disease states. Performed at [facility], [address] [place], [place] [address]   CBC with Differential     Status: Abnormal   Collection Time: 01/13/20  4:58 PM  Result Value Ref Range   WBC 19.2 (H) 4.0 - 10.5 K/uL   RBC 3.57 (L) 4.22 - 5.81 MIL/uL   Hemoglobin 10.1 (L) 13.0 - 17.0 g/dL   HCT 20.2 (L) 39 - 52 %   MCV 84.0 80.0 - 100.0 fL   MCH 28.3 26.0 - 34.0 pg   MCHC 33.7 30.0 - 36.0 g/dL   RDW 54.2 70.6 - 23.7 %   Platelets 222 150 - 400 K/uL   nRBC 0.0 0.0 - 0.2 %   Neutrophils Relative % 74 %   Neutro Abs 14.2 (H) 1.7 - 7.7 K/uL   Lymphocytes Relative 15 %   Lymphs Abs 2.9 0.7 - 4.0 K/uL   Monocytes Relative 10 %   Monocytes Absolute 2.0 (H) 0 - 1 K/uL   Eosinophils Relative 0 %   Eosinophils Absolute 0.0 0 - 0 K/uL   Basophils Relative 0 %   Basophils Absolute 0.1 0 - 0 K/uL   Immature Granulocytes 1 %   Abs Immature Granulocytes 0.12 (H) 0.00 - 0.07 K/uL    Comment: Performed at [facility], [address] [place], [place] [address]  Type and screen [facility]     Status: None   Collection Time: 01/13/20  4:58 PM  Result Value Ref Range   ABO/RH(D) O POS    Antibody Screen NEG    Sample Expiration      01/16/2020,2359 Performed at [facility], [address] [place], [place] [address]   Review of Systems  Constitutional: Negative.   HENT: Negative.   Eyes: Negative.   Respiratory: Negative.   Cardiovascular: Negative.   Gastrointestinal: Positive for blood in stool. Negative for abdominal pain.  Endocrine: Negative.   Genitourinary: Negative.   Musculoskeletal: Positive for arthralgias and back pain. Negative for gait problem, joint swelling, myalgias, neck pain and neck stiffness.  Skin: Positive for pallor. Negative  for color change, rash and wound.  Allergic/Immunologic: Negative.   Neurological: Negative.   Hematological: Negative.   Psychiatric/Behavioral: Negative.    Blood pressure (!) 148/92,  pulse 99, temperature 97.7 F (36.5 C), temperature source Oral, resp. rate 17, height 5\' 6"  (1.676 m), weight 90.7 kg, SpO2 100 %. Physical Exam Vitals reviewed.  Constitutional:      Appearance: Normal appearance.  HENT:     Head: Normocephalic and atraumatic.     Nose: Nose normal.     Mouth/Throat:     Mouth: Mucous membranes are dry.  Pulmonary:     Effort: Pulmonary effort is normal.     Breath sounds: Normal breath sounds.  Abdominal:     General: Abdomen is flat.     Palpations: Abdomen is soft.  Musculoskeletal:        General: Normal range of motion.     Cervical back: Normal range of motion and neck supple.  Skin:    General: Skin is warm and dry.  Neurological:     General: No focal deficit present.     Mental Status: He is alert and oriented to person, place, and time.   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 2) Recent back surgery for severe spinal stenosis. 3) AODM. 4) Acute kidney injury on chronic kidney disease. 5) Hypertension. 6) Obesity.  01/13/2020, 6:56 PM

## 2020-01-14 ENCOUNTER — Inpatient Hospital Stay (HOSPITAL_COMMUNITY): Payer: Medicaid Other | Admitting: Anesthesiology

## 2020-01-14 ENCOUNTER — Inpatient Hospital Stay (HOSPITAL_COMMUNITY): Payer: Medicaid Other

## 2020-01-14 ENCOUNTER — Encounter (HOSPITAL_COMMUNITY)
Admission: EM | Disposition: A | Discharge status: Home / Self Care | Payer: Self-pay | Source: Home / Self Care | Attending: Family Medicine

## 2020-01-14 ENCOUNTER — Encounter (HOSPITAL_COMMUNITY): Payer: Self-pay | Admitting: Gastroenterology

## 2020-01-14 DIAGNOSIS — K7031 Alcoholic cirrhosis of liver with ascites: Principal | ICD-10-CM

## 2020-01-14 DIAGNOSIS — N179 Acute kidney failure, unspecified: Secondary | ICD-10-CM

## 2020-01-14 DIAGNOSIS — R55 Syncope and collapse: Secondary | ICD-10-CM

## 2020-01-14 DIAGNOSIS — F10230 Alcohol dependence with withdrawal, uncomplicated: Secondary | ICD-10-CM

## 2020-01-14 HISTORY — PX: ESOPHAGEAL BANDING: SHX5518

## 2020-01-14 HISTORY — PX: ESOPHAGOGASTRODUODENOSCOPY (EGD) WITH PROPOFOL: SHX5813

## 2020-01-14 LAB — CBC
HCT: 28.7 % — ABNORMAL LOW (ref 39.0–52.0)
HCT: 29.5 % — ABNORMAL LOW (ref 39.0–52.0)
Hemoglobin: 9.8 g/dL — ABNORMAL LOW (ref 13.0–17.0)
Hemoglobin: 10.3 g/dL — ABNORMAL LOW (ref 13.0–17.0)
MCH: 30 pg (ref 26.0–34.0)
MCH: 30.4 pg (ref 26.0–34.0)
MCHC: 34.1 g/dL (ref 30.0–36.0)
MCHC: 34.9 g/dL (ref 30.0–36.0)
MCV: 87.8 fL (ref 80.0–100.0)
MCV: 87 fL (ref 80.0–100.0)
Platelets: 117 10*3/uL — ABNORMAL LOW (ref 150–400)
Platelets: 140 10*3/uL — ABNORMAL LOW (ref 150–400)
RBC: 3.27 MIL/uL — ABNORMAL LOW (ref 4.22–5.81)
RBC: 3.39 MIL/uL — ABNORMAL LOW (ref 4.22–5.81)
RDW: 15.7 % — ABNORMAL HIGH (ref 11.5–15.5)
RDW: 15.7 % — ABNORMAL HIGH (ref 11.5–15.5)
WBC: 12.2 10*3/uL — ABNORMAL HIGH (ref 4.0–10.5)
WBC: 15.3 10*3/uL — ABNORMAL HIGH (ref 4.0–10.5)
nRBC: 0 % (ref 0.0–0.2)
nRBC: 0.2 % (ref 0.0–0.2)

## 2020-01-14 LAB — COMPREHENSIVE METABOLIC PANEL
ALT: 54 U/L — ABNORMAL HIGH (ref 0–44)
AST: 79 U/L — ABNORMAL HIGH (ref 15–41)
Albumin: 3.4 g/dL — ABNORMAL LOW (ref 3.5–5.0)
Alkaline Phosphatase: 69 U/L (ref 38–126)
Anion gap: 14 (ref 5–15)
BUN: 46 mg/dL — ABNORMAL HIGH (ref 6–20)
CO2: 27 mmol/L (ref 22–32)
Calcium: 8.4 mg/dL — ABNORMAL LOW (ref 8.9–10.3)
Chloride: 94 mmol/L — ABNORMAL LOW (ref 98–111)
Creatinine, Ser: 1.73 mg/dL — ABNORMAL HIGH (ref 0.61–1.24)
GFR calc Af Amer: 50 mL/min — ABNORMAL LOW (ref >60)
GFR calc non Af Amer: 43 mL/min — ABNORMAL LOW (ref >60)
Glucose, Bld: 209 mg/dL — ABNORMAL HIGH (ref 70–99)
Potassium: 3.7 mmol/L (ref 3.5–5.1)
Sodium: 135 mmol/L (ref 135–145)
Total Bilirubin: 1.6 mg/dL — ABNORMAL HIGH (ref 0.3–1.2)
Total Protein: 5.7 g/dL — ABNORMAL LOW (ref 6.5–8.1)

## 2020-01-14 LAB — CBC WITH DIFFERENTIAL/PLATELET
Abs Immature Granulocytes: 0.05 10*3/uL (ref 0.00–0.07)
Basophils Absolute: 0.1 10*3/uL (ref 0.0–0.1)
Basophils Relative: 0 %
Eosinophils Absolute: 0 10*3/uL (ref 0.0–0.5)
Eosinophils Relative: 0 %
HCT: 25.6 % — ABNORMAL LOW (ref 39.0–52.0)
Hemoglobin: 8.7 g/dL — ABNORMAL LOW (ref 13.0–17.0)
Immature Granulocytes: 0 %
Lymphocytes Relative: 23 %
Lymphs Abs: 3 10*3/uL (ref 0.7–4.0)
MCH: 30.3 pg (ref 26.0–34.0)
MCHC: 34 g/dL (ref 30.0–36.0)
MCV: 89.2 fL (ref 80.0–100.0)
Monocytes Absolute: 1.7 10*3/uL — ABNORMAL HIGH (ref 0.1–1.0)
Monocytes Relative: 13 %
Neutro Abs: 8 10*3/uL — ABNORMAL HIGH (ref 1.7–7.7)
Neutrophils Relative %: 64 %
Platelets: 108 10*3/uL — ABNORMAL LOW (ref 150–400)
RBC: 2.87 MIL/uL — ABNORMAL LOW (ref 4.22–5.81)
RDW: 15.9 % — ABNORMAL HIGH (ref 11.5–15.5)
WBC: 12.8 10*3/uL — ABNORMAL HIGH (ref 4.0–10.5)
nRBC: 0 % (ref 0.0–0.2)

## 2020-01-14 LAB — CBG MONITORING, ED
Glucose-Capillary: 164 mg/dL — ABNORMAL HIGH (ref 70–99)
Glucose-Capillary: 216 mg/dL — ABNORMAL HIGH (ref 70–99)
Glucose-Capillary: 216 mg/dL — ABNORMAL HIGH (ref 70–99)

## 2020-01-14 LAB — SODIUM, URINE, RANDOM: Sodium, Ur: <10 mmol/L

## 2020-01-14 LAB — HEMOGLOBIN A1C
Hgb A1c MFr Bld: 6.7 % — ABNORMAL HIGH (ref 4.8–5.6)
Mean Plasma Glucose: 146 mg/dL

## 2020-01-14 LAB — URINALYSIS, ROUTINE W REFLEX MICROSCOPIC
Bilirubin Urine: NEGATIVE
Glucose, UA: >=500 mg/dL — AB
Hgb urine dipstick: NEGATIVE
Ketones, ur: 20 mg/dL — AB
Leukocytes,Ua: NEGATIVE
Nitrite: NEGATIVE
Protein, ur: NEGATIVE mg/dL
Specific Gravity, Urine: 1.023 (ref 1.005–1.030)
pH: 6 (ref 5.0–8.0)

## 2020-01-14 LAB — MRSA PCR SCREENING: MRSA by PCR: NEGATIVE

## 2020-01-14 LAB — CREATININE, URINE, RANDOM: Creatinine, Urine: 161.85 mg/dL

## 2020-01-14 LAB — OSMOLALITY, URINE: Osmolality, Ur: 853 mOsm/kg (ref 300–900)

## 2020-01-14 LAB — GLUCOSE, CAPILLARY
Glucose-Capillary: 184 mg/dL — ABNORMAL HIGH (ref 70–99)
Glucose-Capillary: 217 mg/dL — ABNORMAL HIGH (ref 70–99)

## 2020-01-14 LAB — MAGNESIUM: Magnesium: 1.8 mg/dL (ref 1.7–2.4)

## 2020-01-14 LAB — TSH: TSH: 0.513 u[IU]/mL (ref 0.350–4.500)

## 2020-01-14 LAB — PHOSPHORUS: Phosphorus: 3.2 mg/dL (ref 2.5–4.6)

## 2020-01-14 SURGERY — ESOPHAGOGASTRODUODENOSCOPY (EGD) WITH PROPOFOL
Anesthesia: Monitor Anesthesia Care

## 2020-01-14 MED ORDER — SODIUM CHLORIDE 0.9 % IV SOLN: INTRAVENOUS | Status: DC

## 2020-01-14 MED ORDER — METOPROLOL TARTRATE 5 MG/5ML IV SOLN
5.0000 mg | INTRAVENOUS | Status: AC | PRN
  Administered 2020-01-14: 5 mg via INTRAVENOUS
  Filled 2020-01-14: qty 5

## 2020-01-14 MED ORDER — ORAL CARE MOUTH RINSE
15.0000 mL | Freq: Two times a day (BID) | OROMUCOSAL | Status: DC
  Administered 2020-01-14 – 2020-01-18 (×7): 15 mL via OROMUCOSAL

## 2020-01-14 MED ORDER — PROPOFOL 10 MG/ML IV BOLUS
INTRAVENOUS | Status: DC | PRN
  Administered 2020-01-14 (×2): 40 mg via INTRAVENOUS
  Administered 2020-01-14 (×2): 20 mg via INTRAVENOUS

## 2020-01-14 MED ORDER — FENTANYL CITRATE (PF) 100 MCG/2ML IJ SOLN
INTRAMUSCULAR | Status: AC
  Filled 2020-01-14: qty 2

## 2020-01-14 MED ORDER — SUCRALFATE 1 G PO TABS
1.0000 g | ORAL_TABLET | Freq: Three times a day (TID) | ORAL | Status: DC
  Administered 2020-01-15 – 2020-01-18 (×14): 1 g via ORAL
  Filled 2020-01-14 (×16): qty 1

## 2020-01-14 MED ORDER — CHLORHEXIDINE GLUCONATE CLOTH 2 % EX PADS
6.0000 | MEDICATED_PAD | Freq: Every day | CUTANEOUS | Status: DC
  Administered 2020-01-14 – 2020-01-17 (×4): 6 via TOPICAL

## 2020-01-14 MED ORDER — PROPOFOL 500 MG/50ML IV EMUL
INTRAVENOUS | Status: DC | PRN
  Administered 2020-01-14: 120 ug/kg/min via INTRAVENOUS

## 2020-01-14 MED ORDER — LACTATED RINGERS IV SOLN: INTRAVENOUS | Status: DC

## 2020-01-14 MED ORDER — PROPOFOL 500 MG/50ML IV EMUL
INTRAVENOUS | Status: AC
  Filled 2020-01-14: qty 50

## 2020-01-14 MED ORDER — ALUM & MAG HYDROXIDE-SIMETH 200-200-20 MG/5ML PO SUSP
15.0000 mL | ORAL | Status: DC | PRN
  Administered 2020-01-14: 15 mL via ORAL
  Filled 2020-01-14: qty 30

## 2020-01-14 MED ORDER — ONDANSETRON HCL 4 MG/2ML IJ SOLN
INTRAMUSCULAR | Status: DC | PRN
  Administered 2020-01-14: 4 mg via INTRAVENOUS

## 2020-01-14 MED ORDER — FENTANYL CITRATE (PF) 100 MCG/2ML IJ SOLN
25.0000 ug | Freq: Once | INTRAMUSCULAR | Status: AC
  Administered 2020-01-14: 25 ug via INTRAVENOUS

## 2020-01-14 SURGICAL SUPPLY — 15 items

## 2020-01-14 NOTE — Progress Notes (Signed)
Carotid duplex bilateral study completed.   Please see CV Proc for preliminary results.   Rami Waddle  

## 2020-01-14 NOTE — Op Note (Signed)
[facility] Patient Name: Connor Vaughn Procedure Date: 01/14/2020 MRN: [id number] Attending MD: Jeani HawkingPatrick Colie Fugitt , MD Date of Birth: 1961/03/18 CSN: [id number] Age: 59 Admit Type: Inpatient Procedure:                Upper GI endoscopy Indications:              Hematemesis Providers:                Jeani HawkingPatrick Eleonor Ocon, MD, Alexandria LodgeJarmilla Fucs RN, RN, Lawson Radararlene                            Davis, Technician, [place] LoreShannon M. Blanton CRNA, CRNA Referring MD:              Medicines:                Propofol per Anesthesia Complications:            No immediate complications. Estimated Blood Loss:     Estimated blood loss: none. Procedure:                Pre-Anesthesia Assessment:                           - Prior to the procedure, a History and Physical                            was performed, and patient medications and                            allergies were reviewed. The patient's tolerance of                            previous anesthesia was also reviewed. The risks                            and benefits of the procedure and the sedation                            options and risks were discussed with the patient.                            All questions were answered, and informed consent                            was obtained. Prior Anticoagulants: The patient has                            taken no previous anticoagulant or antiplatelet                            agents. ASA Grade Assessment: III - A patient with                            severe systemic disease. After reviewing the risks  and benefits, the patient was deemed in                            satisfactory condition to undergo the procedure.                           - Sedation was administered by an anesthesia                            professional. Deep sedation was attained.                           After obtaining informed consent, the endoscope was                            passed under  direct vision. Throughout the                            procedure, the patient's blood pressure, pulse, and                            oxygen saturations were monitored continuously. The                            GIF-H190 ([id number]) Olympus gastroscope was                            introduced through the mouth, and advanced to the                            second part of duodenum. The upper GI endoscopy was                            accomplished without difficulty. The patient                            tolerated the procedure well. Scope In: Scope Out: Findings:      Large (> 5 mm) varices were found in the middle third of the esophagus       and in the lower third of the esophagus. Seven bands were successfully       placed with incomplete eradication of varices. There was no bleeding       during the procedure.      LA Grade D (one or more mucosal breaks involving at least 75% of       esophageal circumference) esophagitis with no bleeding was found in the       lower third of the esophagus.      Mild portal hypertensive gastropathy was found in the gastric fundus and       in the gastric body.      One non-bleeding superficial duodenal ulcer with no stigmata of bleeding       was found in the duodenal bulb. The lesion was 10 mm in largest       dimension.      Two speed banders were required as two bands with the first  bander did       not deploy properly. A total of 7 bands were successfully placed and       there was a marked drop in the size of the proximal variceal columns. A       total of four variceal columns were visualized. There was no evidence of       any fundic varices. The duodenal bulb ulcer was superficial and it did       not display any evidence of bleeding. It was a clean-based ulcer. Impression:               - Large (> 5 mm) esophageal varices. Incompletely                            eradicated. Banded.                           - LA Grade D reflux  esophagitis with no bleeding.                           - Portal hypertensive gastropathy.                           - Non-bleeding duodenal ulcer with no stigmata of                            bleeding.                           - No specimens collected. Moderate Sedation:      Not Applicable - Patient had care per Anesthesia. Recommendation:           - Return patient to hospital ward for ongoing care.                           - Clear liquid diet.                           - Continue present medications.                           - Add sucralfate 1 gram TID.                           - Repeat EGD in 2-4 weeks for rebanding. Procedure Code(s):        --- Professional ---                           [phone], Esophagogastroduodenoscopy, flexible,                            transoral; with band ligation of esophageal/gastric                            varices Diagnosis Code(s):        --- Professional ---  I85.00, Esophageal varices without bleeding                           K21.00, Gastro-esophageal reflux disease with                            esophagitis, without bleeding                           K76.6, Portal hypertension                           K31.89, Other diseases of stomach and duodenum                           K26.9, Duodenal ulcer, unspecified as acute or                            chronic, without hemorrhage or perforation                           K92.0, Hematemesis CPT copyright 2019 [employer]. All rights reserved. The codes documented in this report are preliminary and upon coder review may  be revised to meet current compliance requirements. Jeani Hawking, MD Jeani Hawking, MD 01/14/2020 12:52:37 PM This report has been signed electronically. Number of Addenda: 0

## 2020-01-14 NOTE — ED Notes (Signed)
Pt resting in stretcher, eyes closed, breathing even and unlabored. NAD. Offers no complaints at this time.

## 2020-01-14 NOTE — Anesthesia Postprocedure Evaluation (Signed)
Anesthesia Post Note  Patient: TAYLER LASSEN  Procedure(s) Performed: ESOPHAGOGASTRODUODENOSCOPY (EGD) WITH PROPOFOL (N/A ) ESOPHAGEAL VARICES BANDING     Patient location during evaluation: PACU Anesthesia Type: MAC Level of consciousness: awake and alert Pain management: pain level controlled Vital Signs Assessment: post-procedure vital signs reviewed and stable Respiratory status: spontaneous breathing, nonlabored ventilation, respiratory function stable and patient connected to nasal cannula oxygen Cardiovascular status: stable and blood pressure returned to baseline Postop Assessment: no apparent nausea or vomiting Anesthetic complications: no   No complications documented.  Last Vitals:  Vitals:   01/14/20 1328 01/14/20 1400  BP:  (!) 182/96  Pulse: 99 (!) 107  Resp: 16 15  Temp:    SpO2: 97% 99%    Last Pain:  Vitals:   01/14/20 1328  TempSrc:   PainSc: [place]
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
## 2020-01-14 NOTE — Transfer of Care (Signed)
Immediate Anesthesia Transfer of Care Note  Patient: Connor Vaughn  Procedure(s) Performed: Procedure(s): ESOPHAGOGASTRODUODENOSCOPY (EGD) WITH PROPOFOL (N/A) ESOPHAGEAL VARICES BANDING  Patient Location: PACU  Anesthesia Type:MAC  Level of Consciousness:  sedated, patient cooperative and responds to stimulation  Airway & Oxygen Therapy:Patient Spontanous Breathing and Patient connected to face mask oxgen  Post-op Assessment:  Report given to PACU RN and Post -op Vital signs reviewed and stable  Post vital signs:  Reviewed and stable  Last Vitals:  Vitals:   01/14/20 1214 01/14/20 1253  BP: (!) 176/91 (!) 215/107  Pulse: (!) 107   Resp: 15 16  Temp: 36.6 C 37.2 C  SpO2: 09% 381%    Complications: No apparent anesthesia complications

## 2020-01-14 NOTE — Anesthesia Preprocedure Evaluation (Addendum)
Anesthesia Evaluation  Patient identified by MRN, date of birth, ID band Patient awake    Reviewed: Allergy & Precautions, NPO status , Patient's Chart, lab work & pertinent test results  Airway Mallampati: I  TM Distance: >3 FB Neck ROM: Full    Dental  (+) Teeth Intact, Dental Advisory Given   Pulmonary former smoker,    Pulmonary exam normal        Cardiovascular negative cardio ROS   Rhythm:Regular Rate:Tachycardia     Neuro/Psych PSYCHIATRIC DISORDERS    GI/Hepatic negative GI ROS, (+) Cirrhosis       ,   Endo/Other  diabetes, Type 2, Oral Hypoglycemic Agents  Renal/GU Renal disease     Musculoskeletal negative musculoskeletal ROS (+)   Abdominal Normal abdominal exam  (+) - obese,   Peds  Hematology   Anesthesia Other Findings   Reproductive/Obstetrics                            Anesthesia Physical  Anesthesia Plan  ASA: III  Anesthesia Plan: MAC   Post-op Pain Management:    Induction: Intravenous  PONV Risk Score and Plan: 0 and Propofol infusion  Airway Management Planned:   Additional Equipment: None  Intra-op Plan:   Post-operative Plan:   Informed Consent: I have reviewed the patients History and Physical, chart, labs and discussed the procedure including the risks, benefits and alternatives for the proposed anesthesia with the patient or authorized representative who has indicated his/her understanding and acceptance.       Plan Discussed with: CRNA  Anesthesia Plan Comments: (Echo: 1. Technically difficult study, very limited views. Left ventricular  ejection fraction, by estimation, is 60 to 65%. The left ventricle has  grossly normal function. Left ventricular endocardial border not optimally  defined to evaluate regional wall  motion. Left ventricular diastolic function could not be evaluated.  2. Right ventricule is poorly visualized but  appears systolic function is  grossly normal. The right ventricular size appears mildly enlarged.  3. The mitral valve is normal in structure. No evidence of mitral valve  regurgitation.  4. The aortic valve was not well visualized. Aortic valve regurgitation  is not visualized. Aortic stenosis evaluation was limited given  technically difficult study, but suspect mild AS (MG 59mHg, Vmax 2.1 m/s) )       Anesthesia Quick Evaluation

## 2020-01-14 NOTE — ED Notes (Signed)
Pt c/o "heart burn" like pain, dry heaving, MD made aware.

## 2020-01-14 NOTE — Progress Notes (Signed)
Inpatient Diabetes Program Recommendations  AACE/ADA: New Consensus Statement on Inpatient Glycemic Control (2015)  Target Ranges:  Prepandial:   less than 140 mg/dL      Peak postprandial:   less than 180 mg/dL (1-2 hours)      Critically ill patients:  140 - 180 mg/dL   Lab Results  Component Value Date   GLUCAP 216 (H) 01/14/2020   HGBA1C 6.6 (H) 12/12/2019    Review of Glycemic Control Results for Connor Vaughn, Connor Vaughn (MRN [id number]) as of 01/14/2020 11:30  Ref. Range 01/13/2020 21:46 01/14/2020 05:17 01/14/2020 09:16  Glucose-Capillary Latest Ref Range: 70 - 99 mg/dL 149 (H) 702 (H) 637 (H)   Diabetes history: DM 2 Outpatient Diabetes medications: Glipizide 10 mg bid Current orders for Inpatient glycemic control:  Novolog 0-9 units Q4 hours  Creat/BUN: 1.73/46 Hgb 5.3  Inpatient Diabetes Program Recommendations:    Please note 0400 dose of Novolog not given, glucose proceeded to increase to 216 this am. Watch on current regimen.  Thanks,  Christena Deem RN, MSN, BC-ADM Inpatient Diabetes Coordinator Team Pager [phone] (8a-5p)

## 2020-01-14 NOTE — Interval H&P Note (Signed)
History and Physical Interval Note:  01/14/2020 12:17 PM  Connor Vaughn  has presented today for surgery, with the diagnosis of Anemia, hx of cirrhosis.  The various methods of treatment have been discussed with the patient and family. After consideration of risks, benefits and other options for treatment, the patient has consented to  Procedure(s): ESOPHAGOGASTRODUODENOSCOPY (EGD) WITH PROPOFOL (N/A) as a surgical intervention.  The patient's history has been reviewed, patient examined, no change in status, stable for surgery.  I have reviewed the patient's chart and labs.  Questions were answered to the patient's satisfaction.     Kasch Borquez D
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
## 2020-01-14 NOTE — ED Notes (Signed)
Assumed care of pt at this time. Pt resting in stretcher, no s/sx of acute distress at this time.  

## 2020-01-15 ENCOUNTER — Encounter (HOSPITAL_COMMUNITY): Payer: Self-pay | Admitting: Gastroenterology

## 2020-01-15 DIAGNOSIS — R079 Chest pain, unspecified: Secondary | ICD-10-CM

## 2020-01-15 LAB — CBC WITH DIFFERENTIAL/PLATELET
Abs Immature Granulocytes: 0.1 10*3/uL — ABNORMAL HIGH (ref 0.00–0.07)
Basophils Absolute: 0.1 10*3/uL (ref 0.0–0.1)
Basophils Relative: 1 %
Eosinophils Absolute: 0.1 10*3/uL (ref 0.0–0.5)
Eosinophils Relative: 1 %
HCT: 28.2 % — ABNORMAL LOW (ref 39.0–52.0)
Hemoglobin: 9.7 g/dL — ABNORMAL LOW (ref 13.0–17.0)
Immature Granulocytes: 1 %
Lymphocytes Relative: 24 %
Lymphs Abs: 2.5 10*3/uL (ref 0.7–4.0)
MCH: 30 pg (ref 26.0–34.0)
MCHC: 34.4 g/dL (ref 30.0–36.0)
MCV: 87.3 fL (ref 80.0–100.0)
Monocytes Absolute: 1.5 10*3/uL — ABNORMAL HIGH (ref 0.1–1.0)
Monocytes Relative: 15 %
Neutro Abs: 6.1 10*3/uL (ref 1.7–7.7)
Neutrophils Relative %: 58 %
Platelets: 94 10*3/uL — ABNORMAL LOW (ref 150–400)
RBC: 3.23 MIL/uL — ABNORMAL LOW (ref 4.22–5.81)
RDW: 15.5 % (ref 11.5–15.5)
WBC: 10.4 10*3/uL (ref 4.0–10.5)
nRBC: 0.2 % (ref 0.0–0.2)

## 2020-01-15 LAB — GLUCOSE, CAPILLARY
Glucose-Capillary: 186 mg/dL — ABNORMAL HIGH (ref 70–99)
Glucose-Capillary: 196 mg/dL — ABNORMAL HIGH (ref 70–99)
Glucose-Capillary: 253 mg/dL — ABNORMAL HIGH (ref 70–99)
Glucose-Capillary: 226 mg/dL — ABNORMAL HIGH (ref 70–99)
Glucose-Capillary: 241 mg/dL — ABNORMAL HIGH (ref 70–99)

## 2020-01-15 LAB — BASIC METABOLIC PANEL
Anion gap: 10 (ref 5–15)
BUN: 31 mg/dL — ABNORMAL HIGH (ref 6–20)
CO2: 27 mmol/L (ref 22–32)
Calcium: 8 mg/dL — ABNORMAL LOW (ref 8.9–10.3)
Chloride: 96 mmol/L — ABNORMAL LOW (ref 98–111)
Creatinine, Ser: 1.43 mg/dL — ABNORMAL HIGH (ref 0.61–1.24)
GFR calc Af Amer: >60 mL/min (ref >60)
GFR calc non Af Amer: 54 mL/min — ABNORMAL LOW (ref >60)
Glucose, Bld: 207 mg/dL — ABNORMAL HIGH (ref 70–99)
Potassium: 3.1 mmol/L — ABNORMAL LOW (ref 3.5–5.1)
Sodium: 133 mmol/L — ABNORMAL LOW (ref 135–145)

## 2020-01-15 LAB — MAGNESIUM: Magnesium: 1.9 mg/dL (ref 1.7–2.4)

## 2020-01-15 MED ORDER — POTASSIUM CHLORIDE 20 MEQ PO PACK: 40.0000 meq | PACK | Freq: Two times a day (BID) | ORAL | Status: DC

## 2020-01-15 MED ORDER — POTASSIUM CHLORIDE 20 MEQ PO PACK
40.0000 meq | PACK | Freq: Two times a day (BID) | ORAL | Status: AC
  Administered 2020-01-15 (×2): 40 meq via ORAL
  Filled 2020-01-15 (×2): qty 2

## 2020-01-15 NOTE — Progress Notes (Signed)
Progress Note  Patient Name: Connor Vaughn Date of Encounter: 01/15/2020  [facility] Cardiologist: No primary care provider on file.   Subjective   Denies any chest pain or dyspnea.  Inpatient Medications    Scheduled Meds: . busPIRone  10 mg Oral TID  . Chlorhexidine Gluconate Cloth  6 each Topical Q0600  . DULoxetine  60 mg Oral Daily  . folic acid  1 mg Oral Daily  . gabapentin  300 mg Oral BID  . insulin aspart  0-9 Units Subcutaneous Q4H  . mouth rinse  15 mL Mouth Rinse BID  . multivitamin with minerals  1 tablet Oral Daily  . [START ON 01/17/2020] pantoprazole  40 mg Intravenous Q12H  . pneumococcal 23 valent vaccine  0.5 mL Intramuscular Tomorrow-1000  . potassium chloride  40 mEq Oral BID  . sodium chloride flush  3 mL Intravenous Q12H  . sucralfate  1 g Oral TID WC & HS  . thiamine  100 mg Oral Daily   Or  . thiamine  100 mg Intravenous Daily   Continuous Infusions: . ciprofloxacin Stopped (01/15/20 0946)  . lactated ringers 10 mL/hr at 01/14/20 1227  . octreotide  (SANDOSTATIN)    IV infusion 50 mcg/hr (01/15/20 0500)  . pantoprozole (PROTONIX) infusion 8 mg/hr (01/15/20 0500)   PRN Meds: acetaminophen **OR** acetaminophen, alum & mag hydroxide-simeth, HYDROcodone-acetaminophen, LORazepam **OR** LORazepam, methocarbamol, ondansetron **OR** ondansetron (ZOFRAN) IV   Vital Signs    Vitals:   01/15/20 0351 01/15/20 0400 01/15/20 0600 01/15/20 0744  BP:  134/62 110/64 120/80  Pulse:  85 97 84  Resp:  13 17 18   Temp: 97.7 F (36.5 C)   98.1 F (36.7 C)  TempSrc: Oral   Oral  SpO2:  97% 97% 95%  Weight:      Height:        Intake/Output Summary (Last 24 hours) at 01/15/2020 1050 Last data filed at 01/15/2020 1000 Gross per 24 hour  Intake 1562.17 ml  Output 1150 ml  Net 412.17 ml   Last 3 Weights 01/14/2020 01/14/2020 01/13/2020  Weight (lbs) 213 lb 10 oz 200 lb 200 lb  Weight (kg) 96.9 kg 90.719 kg 90.719 kg      Telemetry    NSR, rate  80-90s- Personally Reviewed  ECG    NSR, rate 108, Q waves in III/aVF - Personally Reviewed  Physical Exam   GEN: No acute distress.   Neck: No JVD Cardiac: RRR, no murmurs, rubs, or gallops.  Respiratory: Clear to auscultation bilaterally. GI: Soft, nontender, non-distended  MS: No edema; No deformity. Neuro:  Nonfocal  Psych: Normal affect   Labs    High Sensitivity Troponin:  No results for input(s): TROPONINIHS in the last 720 hours.    Chemistry Recent Labs  Lab 01/13/20 1658 01/14/20 0458 01/15/20 0210  NA 132* 135 133*  K 3.9 3.7 3.1*  CL 89* 94* 96*  CO2 23 27 27   GLUCOSE 303* 209* 207*  BUN 46* 46* 31*  CREATININE 1.49* 1.73* 1.43*  CALCIUM 9.5 8.4* 8.0*  PROT 6.6 5.7*  --   ALBUMIN 3.9 3.4*  --   AST 52* 79*  --   ALT 38 54*  --   ALKPHOS 82 69  --   BILITOT 3.5* 1.6*  --   GFRNONAA 51* 43* 54*  GFRAA 60* 50* >60  ANIONGAP 20* 14 10     Hematology Recent Labs  Lab 01/14/20 1100 01/14/20 1700 01/15/20 0210  WBC 12.2* 15.3* 10.4  RBC 3.27* 3.39* 3.23*  HGB 9.8* 10.3* 9.7*  HCT 28.7* 29.5* 28.2*  MCV 87.8 87.0 87.3  MCH 30.0 30.4 30.0  MCHC 34.1 34.9 34.4  RDW 15.7* 15.7* 15.5  PLT 117* 140* 94*    BNPNo results for input(s): BNP, PROBNP in the last 168 hours.   DDimer No results for input(s): DDIMER in the last 168 hours.   Radiology    VAS US CAROTID  Result Date: 01/14/2020 Carotid Arterial Duplex Study Indications:       Syncope. Risk Factors:      Diabetes, past history of smoking. Comparison Study:  No prior studies. Performing Technologist: Jean Rosenthalachel Hodge  Examination Guidelines: A complete evaluation includes B-mode imaging, spectral Doppler, color Doppler, and power Doppler as needed of all accessible portions of each vessel. Bilateral testing is considered an integral part of a complete examination. Limited examinations for reoccurring indications may be performed as noted.  Right Carotid Findings:  +----------+--------+--------+--------+------------------+--------+           PSV cm/sEDV cm/sStenosisPlaque DescriptionComments +----------+--------+--------+--------+------------------+--------+ CCA Prox  95      15                                         +----------+--------+--------+--------+------------------+--------+ CCA Distal80      13                                         +----------+--------+--------+--------+------------------+--------+ ICA Prox  43      13      1-39%   heterogenous               +----------+--------+--------+--------+------------------+--------+ ICA Distal83      27                                         +----------+--------+--------+--------+------------------+--------+ ECA       88      17                                         +----------+--------+--------+--------+------------------+--------+ +----------+--------+-------+----------------+-------------------+           PSV cm/sEDV cmsDescribe        Arm Pressure (mmHG) +----------+--------+-------+----------------+-------------------+ [id number]            Multiphasic, WNL                    +----------+--------+-------+----------------+-------------------+ +---------+--------+--+--------+--+---------+ VertebralPSV cm/s46EDV cm/s12Antegrade +---------+--------+--+--------+--+---------+  Left Carotid Findings: +----------+--------+--------+--------+------------------+--------+           PSV cm/sEDV cm/sStenosisPlaque DescriptionComments +----------+--------+--------+--------+------------------+--------+ CCA Prox  122     19                                         +----------+--------+--------+--------+------------------+--------+ CCA Distal102     22                                         +----------+--------+--------+--------+------------------+--------+  ICA Prox  70      18      1-39%   heterogenous                +----------+--------+--------+--------+------------------+--------+ ICA Distal100     34                                         +----------+--------+--------+--------+------------------+--------+ +----------+--------+--------+----------------+-------------------+           PSV cm/sEDV cm/sDescribe        Arm Pressure (mmHG) +----------+--------+--------+----------------+-------------------+ [id number]             Multiphasic, WNL                    +----------+--------+--------+----------------+-------------------+ +---------+--------+--+--------+--+---------+ VertebralPSV cm/s58EDV cm/s19Antegrade +---------+--------+--+--------+--+---------+   Summary: Right Carotid: Velocities in the right ICA are consistent with a 1-39% stenosis. Left Carotid: Velocities in the left ICA are consistent with a 1-39% stenosis. Vertebrals:  Bilateral vertebral arteries demonstrate antegrade flow. Subclavians: Normal flow hemodynamics were seen in bilateral subclavian              arteries. *See table(s) above for measurements and observations.  Electronically signed by Coral Else MD on 01/14/2020 at 5:39:43 PM.    Final     Cardiac Studies   TTE 12/12/19:  1. Technically difficult study, very limited views. Left ventricular  ejection fraction, by estimation, is 60 to 65%. The left ventricle has  grossly normal function. Left ventricular endocardial border not optimally  defined to evaluate regional wall  motion. Left ventricular diastolic function could not be evaluated.  2. Right ventricule is poorly visualized but appears systolic function is  grossly normal. The right ventricular size appears mildly enlarged.  3. The mitral valve is normal in structure. No evidence of mitral valve  regurgitation.  4. The aortic valve was not well visualized. Aortic valve regurgitation  is not visualized. Aortic stenosis evaluation was limited given  technically difficult study, but suspect mild  AS (MG , Vmax 2.1 m/s)   Patient Profile     59 y.o. male with a history of alcoholic cirrhosis with esophageal varices, hepatitis C, recurrent syncope, hypertension, type 2 diabetes mellitus, CKD stage II-III, depression, chronic back pain  who is being seen today for the evaluation of syncope  Assessment & Plan    Recurrent Syncope: suspect orthostatic hypotension vs vasovagal.    Recent Echo on 12/12/2019 showed LVEF of 60-65% with mild AS. It was a technically limited study with limited views. Carotid ultrasounds showed 1-39% stenosis of bilateral ICAs. - Continue to monitor on telemetry, will plan outpatient monitor  Chest Pain: description suggests noncardiac pain, as states that lasts for a few seconds and resolves.  No further work-up recommended in setting of GI bleed.  Can plan outpatient follow-up  Hematemesis Alcoholic Cirrhosis with History of Esophageal Varices - Started on Protonix and Octreotide drip in the ED.  - Hemoglobin dropped to 5.3. S/p 2 units of PRBCs. - GI consulted and underwent EGD with banding of varices  Hypertension: BP as high as 164/100 in the ED but has improved. On Lopressor 50mg  twice daily at home but this as not been started here.   AKI: Creatinine 1.73 today, improved to 1.4 today.  Home lasix/aldactone being held   For questions or updates, please contact [facility] Please consult [email or web address] for contact info under  Signed, Little Ishikawa, MD  01/15/2020, 10:50 AM

## 2020-01-15 NOTE — Progress Notes (Signed)
Patient's complaining of heartburn even after giving Maalox. Paged [facility]/ M.Katherina Right for additional orders. MD ordered to start the Sucralfate 1g order for bedtime. After follow-up the patient felt relief. Will continue to monitor the patient and educate patient with the post-op of EGD.

## 2020-01-15 NOTE — Progress Notes (Signed)
Patient is complaining of perineal swelling. Assessed w/ another nurse Bronson Curb, LPN). Patient has swelling to his perineal region. AMION page sent to Dr. Mahala Menghini.

## 2020-01-15 NOTE — Progress Notes (Signed)
Patient is in room. Patient walked to bed. Oriented to call bell and staff.

## 2020-01-15 NOTE — Progress Notes (Signed)
Called report to [facility] on 5W.

## 2020-01-15 NOTE — Progress Notes (Signed)
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 Loreta Ave consulted by ED started on Protonix and octreotide gtt. Patient underwent repeat EGD 8/24-large esophageal varices, grade D esophagitis and duodenal ulcer without bleeding or stigmata banding was done Cardiology consulted  Assessment & Plan:   Active Problems:   Syncope   Alcoholic cirrhosis of liver with ascites (HCC)   AKI (acute kidney injury) (HCC)   Hematemesis   Acute on chronic renal insufficiency   CKD (chronic kidney disease), stage III   DM (diabetes mellitus), type 2 with complications (HCC)   1. Upper GI bleed secondary to large varices with esophagitis a. EGD performed showed large varices which were banded as per report b. Currently on clear liquid diet c. Continues on Protonix and octreotide gtt. as per GI d. Given some nausea this morning probably keep everything as is for now-defer to GI for further plan 2. Syncope a. Appreciate cardiology input b. Seems to occur when he changes position and posture raising concern for orthostasis/he may have to altered hemodynamics because of increasing splanchnic circulation secondary to cirrhosis c. Cardiology  recommended holding Lopressor, Lasix and Aldactone for now d. Echo showed mild aortic stenosis LVEF 60-65% carotids are normal gradually reimplement meds 3. Hypokalemia a. Replace orally with K. Dur 40 b. Magnesium okay at 1.9 c. Monitor trends/arrhythmia 4. Recent lumbar surgery a. Continue Norco 1-2 tabs every 4 as needed Tylenol as first choice for pain b. Outpatient follow-up with neurosurgery 5. HCV status post treatment 6. Cirrhosis secondary to alcohol complicated by varices as above a. No evidences of withdrawal at this time b. Monitor and discontinue protocol low not actively withdrawing the next 24 to 48 hours 7. CKD 3 a. Creatinine is greatly improved since admission given diuretics b. Monitor trends in a.m. 8. Lumbar laminectomy 12/15/2019 9. Recurrent pleural effusions right-sided 10. Prior heavy drinker  DVT prophylaxis: SCD Code Status: Full none Family Communication: None  disposition: Inpatient  Status is: Inpatient  Remains inpatient appropriate because:Hemodynamically unstable   Dispo: The patient is from: Home              Anticipated d/c is to: Home              Anticipated d/c date is: 1 day              Patient currently is not medically stable to d/c.       Consultants:   GI  Cardiology  Procedures: EGD  Antimicrobials: None   Subjective: Some nausea this morning with clear emesis however was able to keep down broth without any issue no chest pain no fever no chills Feels a little bloated no diarrhea  Objective: Vitals:   01/15/20 0200 01/15/20 0351 01/15/20 0400 01/15/20 0600  BP: 128/77  134/62 110/64  Pulse: 88  85 97  Resp: 11  13 17   Temp:  97.7 F (36.5 C)    TempSrc:  Oral    SpO2: 96%  97% 97%  Weight:      Height:        Intake/Output Summary (Last 24 hours) at 01/15/2020 01/17/2020 Last data filed at 01/15/2020 0500 Gross per 24 hour  Intake 2193.42 ml  Output 850 ml  Net 1343.42 ml   Filed Weights   01/13/20 1738  01/14/20 1214 01/14/20 2100  Weight: 90.7 kg 90.7 kg 96.9 kg    Examination:  General exam: Weight coherent tired appearing white male looking about stated age Respiratory system: Clear no added sound no rales no rhonchi Cardiovascular system: S1-S2 no murmur rub or gallop seems to be in sinus Gastrointestinal system: Soft slightly bloated nondistended no rebound no guarding. Central nervous system: Intact no focal deficit Extremities: Soft extremities with no joint swellings or limitations to motion no lower extremity edema Skin: No lower extremity edema Psychiatry: Euthymic congruent  Data Reviewed: I have personally reviewed following labs and imaging studies Potassium 3.1 sodium 133 BUNs/creatinine down from admission 46/1.4-->31/1.43   Radiology Studies: VAS 02-25-2006 CAROTID  Result Date: 01/14/2020 Carotid Arterial Duplex Study Indications:       Syncope. Risk Factors:      Diabetes, past history of smoking. Comparison Study:  No prior studies. Performing Technologist: 01/16/2020  Examination Guidelines: A complete evaluation includes B-mode imaging, spectral Doppler, color Doppler, and power Doppler as needed of all accessible portions of each vessel. Bilateral testing is considered an integral part of a complete examination. Limited examinations for reoccurring indications may be performed as noted.  Right Carotid Findings: +----------+--------+--------+--------+------------------+--------+           PSV cm/sEDV cm/sStenosisPlaque DescriptionComments +----------+--------+--------+--------+------------------+--------+ CCA Prox  95      15                                         +----------+--------+--------+--------+------------------+--------+ CCA Distal80      13                                         +----------+--------+--------+--------+------------------+--------+ ICA Prox  43      13      1-39%   heterogenous                +----------+--------+--------+--------+------------------+--------+ ICA Distal83      27                                         +----------+--------+--------+--------+------------------+--------+ ECA       88      17                                         +----------+--------+--------+--------+------------------+--------+ +----------+--------+-------+----------------+-------------------+           PSV cm/sEDV cmsDescribe        Arm Pressure (mmHG) +----------+--------+-------+----------------+-------------------+ Jean Rosenthal            Multiphasic, WNL                    +----------+--------+-------+----------------+-------------------+ +---------+--------+--+--------+--+---------+  VertebralPSV cm/s46EDV cm/s12Antegrade +---------+--------+--+--------+--+---------+  Left Carotid Findings: +----------+--------+--------+--------+------------------+--------+           PSV cm/sEDV cm/sStenosisPlaque DescriptionComments +----------+--------+--------+--------+------------------+--------+ CCA Prox  122     19                                         +----------+--------+--------+--------+------------------+--------+ CCA Distal102     22                                         +----------+--------+--------+--------+------------------+--------+ ICA Prox  70      18      1-39%   heterogenous               +----------+--------+--------+--------+------------------+--------+ ICA Distal100     34                                         +----------+--------+--------+--------+------------------+--------+ +----------+--------+--------+----------------+-------------------+           PSV cm/sEDV cm/sDescribe        Arm Pressure (mmHG) +----------+--------+--------+----------------+-------------------+ [id number]             Multiphasic, WNL                    +----------+--------+--------+----------------+-------------------+  +---------+--------+--+--------+--+---------+ VertebralPSV cm/s58EDV cm/s19Antegrade +---------+--------+--+--------+--+---------+   Summary: Right Carotid: Velocities in the right ICA are consistent with a 1-39% stenosis. Left Carotid: Velocities in the left ICA are consistent with a 1-39% stenosis. Vertebrals:  Bilateral vertebral arteries demonstrate antegrade flow. Subclavians: Normal flow hemodynamics were seen in bilateral subclavian              arteries. *See table(s) above for measurements and observations.  Electronically signed by Coral ElseVance Brabham MD on 01/14/2020 at 5:39:43 PM.    Final      Scheduled Meds: . busPIRone  10 mg Oral TID  . Chlorhexidine Gluconate Cloth  6 each Topical Q0600  . DULoxetine  60 mg Oral Daily  . folic acid  1 mg Oral Daily  . gabapentin  300 mg Oral BID  . insulin aspart  0-9 Units Subcutaneous Q4H  . mouth rinse  15 mL Mouth Rinse BID  . multivitamin with minerals  1 tablet Oral Daily  . [START ON 01/17/2020] pantoprazole  40 mg Intravenous Q12H  . pneumococcal 23 valent vaccine  0.5 mL Intramuscular Tomorrow-1000  . potassium chloride  40 mEq Oral BID  . sodium chloride flush  3 mL Intravenous Q12H  . sucralfate  1 g Oral TID WC & HS  . thiamine  100 mg Oral Daily   Or  . thiamine  100 mg Intravenous Daily   Continuous Infusions: . ciprofloxacin Stopped (01/14/20 2201)  . lactated ringers 10 mL/hr at 01/14/20 1227  . octreotide  (SANDOSTATIN)    IV infusion 50 mcg/hr (01/15/20 0500)  . pantoprozole (PROTONIX) infusion 8 mg/hr (01/15/20 0500)     LOS: 2 days    Time spent: 25 minutes  Rhetta MuraJai-Gurmukh Shunta Mclaurin, MD [facility] To contact the attending provider between 7A-7P or the covering provider during after hours 7P-7A, please log into the web site [email or web address] and access using universal [place] password for that web site. If you do not  have the password, please call the hospital operator.  01/15/2020, 6:49 AM

## 2020-01-15 NOTE — Progress Notes (Signed)
Subjective: Since I last evaluated the patient, he seems to be doing fairly well.  He has had esophageal banding done by Dr. Luisa Hart on yesterday when 7 bands were placed.  He claims he has been able to tolerate chicken broth well and denies having any dysphagia at this time.  He still has black stools and had one small volume bowel movement earlier today.  He claims he has some pain in the back of his head and a swollen right testicle which he wants " checked out" prior to discharge.  He denies having any abdominal pain, nausea or vomiting.  Objective: Vital signs in last 24 hours: Temp:  [97.5 F (36.4 C)-98.1 F (36.7 C)] 97.8 F (36.6 C) (08/25 1606) Pulse Rate:  [84-110] 96 (08/25 1606) Resp:  [11-21] 16 (08/25 1606) BP: (110-170)/(62-101) 151/98 (08/25 1606) SpO2:  [95 %-100 %] 98 % (08/25 1606) Weight:  [96.9 kg] 96.9 kg (08/24 2100) Last BM Date: 01/15/20  Intake/Output from previous day: 08/24 0701 - 08/25 0700 In: 2193.4 [I.V.:1993.4; IV Piggyback:200] Out: 850 [Urine:850] Intake/Output this shift: Total I/O In: 1484.3 [P.O.:480; I.V.:648.2; IV Piggyback:356.1] Out: 800 [Urine:800]  General appearance: alert, pale, cooperative, appears stated age, no distress, moderately obese and pale Resp: clear to auscultation bilaterally Cardio: regular rate and rhythm, S1, S2 normal, no murmur, click, rub or gallop GI: soft, obese non-tender; bowel sounds normal; no masses,  no organomegaly Extremities: extremities normal, atraumatic, no cyanosis or edema  Lab Results: Recent Labs    01/14/20 1100 01/14/20 1700 01/15/20 0210  WBC 12.2* 15.3* 10.4  HGB 9.8* 10.3* 9.7*  HCT 28.7* 29.5* 28.2*  PLT 117* 140* 94*   BMET Recent Labs    01/13/20 1658 01/14/20 0458 01/15/20 0210  NA 132* 135 133*  K 3.9 3.7 3.1*  CL 89* 94* 96*  CO2 23 27 27   GLUCOSE 303* 209* 207*  BUN 46* 46* 31*  CREATININE 1.49* 1.73* 1.43*  CALCIUM 9.5 8.4* 8.0*   LFT Recent Labs     01/14/20 0458  PROT 5.7*  ALBUMIN 3.4*  AST 79*  ALT 54*  ALKPHOS 69  BILITOT 1.6*   PT/INR Recent Labs    01/13/20 1658  LABPROT 15.5*  INR 1.3*   Medications: I have reviewed the patient's current medications.  Assessment/Plan: Alcoholic hepatitis/history of Hepatitis C complicated by bleeding esophageal varices long with a superficial duodenal ulcer-varices were banded yesterday.  He seems to be doing fairly well and hemoglobin stable at 9.7 g/dL today.  We will continue to observe him for another day or 2 prior to discharge and arrange for follow-up in the office within a week of discharge.  LOS: 2 days   01/15/20 01/15/2020, 6:24 PM

## 2020-01-16 ENCOUNTER — Other Ambulatory Visit: Payer: Self-pay | Admitting: Student

## 2020-01-16 DIAGNOSIS — R55 Syncope and collapse: Secondary | ICD-10-CM

## 2020-01-16 LAB — COMPREHENSIVE METABOLIC PANEL
ALT: 113 U/L — ABNORMAL HIGH (ref 0–44)
AST: 147 U/L — ABNORMAL HIGH (ref 15–41)
Albumin: 3.4 g/dL — ABNORMAL LOW (ref 3.5–5.0)
Alkaline Phosphatase: 73 U/L (ref 38–126)
Anion gap: 8 (ref 5–15)
BUN: 14 mg/dL (ref 6–20)
CO2: 26 mmol/L (ref 22–32)
Calcium: 8.1 mg/dL — ABNORMAL LOW (ref 8.9–10.3)
Chloride: 98 mmol/L (ref 98–111)
Creatinine, Ser: 1.18 mg/dL (ref 0.61–1.24)
GFR calc Af Amer: >60 mL/min (ref >60)
GFR calc non Af Amer: >60 mL/min (ref >60)
Glucose, Bld: 171 mg/dL — ABNORMAL HIGH (ref 70–99)
Potassium: 3.8 mmol/L (ref 3.5–5.1)
Sodium: 132 mmol/L — ABNORMAL LOW (ref 135–145)
Total Bilirubin: 1.1 mg/dL (ref 0.3–1.2)
Total Protein: 5.4 g/dL — ABNORMAL LOW (ref 6.5–8.1)

## 2020-01-16 LAB — GLUCOSE, CAPILLARY
Glucose-Capillary: 149 mg/dL — ABNORMAL HIGH (ref 70–99)
Glucose-Capillary: 161 mg/dL — ABNORMAL HIGH (ref 70–99)
Glucose-Capillary: 175 mg/dL — ABNORMAL HIGH (ref 70–99)
Glucose-Capillary: 178 mg/dL — ABNORMAL HIGH (ref 70–99)
Glucose-Capillary: 225 mg/dL — ABNORMAL HIGH (ref 70–99)
Glucose-Capillary: 237 mg/dL — ABNORMAL HIGH (ref 70–99)
Glucose-Capillary: 245 mg/dL — ABNORMAL HIGH (ref 70–99)

## 2020-01-16 LAB — CBC WITH DIFFERENTIAL/PLATELET
Abs Immature Granulocytes: 0.03 10*3/uL (ref 0.00–0.07)
Basophils Absolute: 0 10*3/uL (ref 0.0–0.1)
Basophils Relative: 1 %
Eosinophils Absolute: 0.2 10*3/uL (ref 0.0–0.5)
Eosinophils Relative: 4 %
HCT: 27.8 % — ABNORMAL LOW (ref 39.0–52.0)
Hemoglobin: 9.3 g/dL — ABNORMAL LOW (ref 13.0–17.0)
Immature Granulocytes: 0 %
Lymphocytes Relative: 26 %
Lymphs Abs: 1.8 10*3/uL (ref 0.7–4.0)
MCH: 29.9 pg (ref 26.0–34.0)
MCHC: 33.5 g/dL (ref 30.0–36.0)
MCV: 89.4 fL (ref 80.0–100.0)
Monocytes Absolute: 0.8 10*3/uL (ref 0.1–1.0)
Monocytes Relative: 11 %
Neutro Abs: 4.1 10*3/uL (ref 1.7–7.7)
Neutrophils Relative %: 58 %
Platelets: 106 10*3/uL — ABNORMAL LOW (ref 150–400)
RBC: 3.11 MIL/uL — ABNORMAL LOW (ref 4.22–5.81)
RDW: 15.8 % — ABNORMAL HIGH (ref 11.5–15.5)
WBC: 6.9 10*3/uL (ref 4.0–10.5)
nRBC: 0 % (ref 0.0–0.2)

## 2020-01-16 MED ORDER — FUROSEMIDE 40 MG PO TABS
40.0000 mg | ORAL_TABLET | Freq: Every day | ORAL | Status: DC
  Administered 2020-01-16 – 2020-01-18 (×3): 40 mg via ORAL
  Filled 2020-01-16 (×3): qty 1

## 2020-01-16 MED ORDER — PANTOPRAZOLE SODIUM 40 MG PO TBEC
40.0000 mg | DELAYED_RELEASE_TABLET | Freq: Two times a day (BID) | ORAL | Status: DC
  Administered 2020-01-16 – 2020-01-18 (×5): 40 mg via ORAL
  Filled 2020-01-16 (×6): qty 1

## 2020-01-16 MED ORDER — SPIRONOLACTONE 25 MG PO TABS
100.0000 mg | ORAL_TABLET | Freq: Every day | ORAL | Status: DC
  Administered 2020-01-16 – 2020-01-18 (×3): 100 mg via ORAL
  Filled 2020-01-16 (×3): qty 4

## 2020-01-16 MED ORDER — CIPROFLOXACIN HCL 500 MG PO TABS
500.0000 mg | ORAL_TABLET | Freq: Two times a day (BID) | ORAL | Status: DC
  Administered 2020-01-16 – 2020-01-18 (×5): 500 mg via ORAL
  Filled 2020-01-16 (×5): qty 1

## 2020-01-16 NOTE — Progress Notes (Addendum)
Telemetry reviewed, no significant events.  Suspect syncope represents orthostatic hypotension vs vasovagal syncope, but will need to rule out arrhythmia.  Will plan for cardiac monitor on discharge.  Would be cautious with diuretics as appeared dry on admission and likely contributed to syncope, renal function has normalized with holding diuretics.  [facility] will sign off.   Medication Recommendations:  None Other recommendations (labs, testing, etc):  Zio patch x 2 weeks Follow up as an outpatient:  Will schedule follow-up in 1-2 months

## 2020-01-16 NOTE — Progress Notes (Signed)
Subjective: Complaining about scrotal swelling.  Objective: Vital signs in last 24 hours: Temp:  [97.5 F (36.4 C)-98.1 F (36.7 C)] 98.1 F (36.7 C) (08/26 0357) Pulse Rate:  [88-102] 102 (08/26 0357) Resp:  [16-19] 18 (08/26 0357) BP: (139-154)/(77-105) 139/88 (08/26 0357) SpO2:  [94 %-98 %] 94 % (08/26 0357) Last BM Date: 01/15/20  Intake/Output from previous day: 08/25 0701 - 08/26 0700 In: 2444.3 [P.O.:1440; I.V.:648.2; IV Piggyback:356.1] Out: 2650 [Urine:2650] Intake/Output this shift: Total I/O In: -  Out: 300 [Urine:300]  General appearance: alert and no distress GI: soft, non-tender; bowel sounds normal; no masses,  no organomegaly Severe scrotal swelling  Lab Results: Recent Labs    01/14/20 1700 01/15/20 0210 01/16/20 0259  WBC 15.3* 10.4 6.9  HGB 10.3* 9.7* 9.3*  HCT 29.5* 28.2* 27.8*  PLT 140* 94* 106*   BMET Recent Labs    01/14/20 0458 01/15/20 0210 01/16/20 0259  NA 135 133* 132*  K 3.7 3.1* 3.8  CL 94* 96* 98  CO2 27 27 26   GLUCOSE 209* 207* 171*  BUN 46* 31* 14  CREATININE 1.73* 1.43* 1.18  CALCIUM 8.4* 8.0* 8.1*   LFT Recent Labs    01/16/20 0259  PROT 5.4*  ALBUMIN 3.4*  AST 147*  ALT 113*  ALKPHOS 73  BILITOT 1.1   PT/INR Recent Labs    01/13/20 1658  LABPROT 15.5*  INR 1.3*   Hepatitis Panel No results for input(s): HEPBSAG, HCVAB, HEPAIGM, HEPBIGM in the last 72 hours. C-Diff No results for input(s): CDIFFTOX in the last 72 hours. Fecal Lactopherrin No results for input(s): FECLLACTOFRN in the last 72 hours.  Studies/Results: VAS 01/15/20 CAROTID  Result Date: 01/14/2020 Carotid Arterial Duplex Study Indications:       Syncope. Risk Factors:      Diabetes, past history of smoking. Comparison Study:  No prior studies. Performing Technologist: 01/16/2020  Examination Guidelines: A complete evaluation includes B-mode imaging, spectral Doppler, color Doppler, and power Doppler as needed of all accessible portions of  each vessel. Bilateral testing is considered an integral part of a complete examination. Limited examinations for reoccurring indications may be performed as noted.  Right Carotid Findings: +----------+--------+--------+--------+------------------+--------+           PSV cm/sEDV cm/sStenosisPlaque DescriptionComments +----------+--------+--------+--------+------------------+--------+ CCA Prox  95      15                                         +----------+--------+--------+--------+------------------+--------+ CCA Distal80      13                                         +----------+--------+--------+--------+------------------+--------+ ICA Prox  43      13      1-39%   heterogenous               +----------+--------+--------+--------+------------------+--------+ ICA Distal83      27                                         +----------+--------+--------+--------+------------------+--------+ ECA       88      17                                         +----------+--------+--------+--------+------------------+--------+ +----------+--------+-------+----------------+-------------------+  PSV cm/sEDV cmsDescribe        Arm Pressure (mmHG) +----------+--------+-------+----------------+-------------------+ [id number]            Multiphasic, WNL                    +----------+--------+-------+----------------+-------------------+ +---------+--------+--+--------+--+---------+ VertebralPSV cm/s46EDV cm/s12Antegrade +---------+--------+--+--------+--+---------+  Left Carotid Findings: +----------+--------+--------+--------+------------------+--------+           PSV cm/sEDV cm/sStenosisPlaque DescriptionComments +----------+--------+--------+--------+------------------+--------+ CCA Prox  122     19                                         +----------+--------+--------+--------+------------------+--------+ CCA Distal102     22                                          +----------+--------+--------+--------+------------------+--------+ ICA Prox  70      18      1-39%   heterogenous               +----------+--------+--------+--------+------------------+--------+ ICA Distal100     34                                         +----------+--------+--------+--------+------------------+--------+ +----------+--------+--------+----------------+-------------------+           PSV cm/sEDV cm/sDescribe        Arm Pressure (mmHG) +----------+--------+--------+----------------+-------------------+ [id number]             Multiphasic, WNL                    +----------+--------+--------+----------------+-------------------+ +---------+--------+--+--------+--+---------+ VertebralPSV cm/s58EDV cm/s19Antegrade +---------+--------+--+--------+--+---------+   Summary: Right Carotid: Velocities in the right ICA are consistent with a 1-39% stenosis. Left Carotid: Velocities in the left ICA are consistent with a 1-39% stenosis. Vertebrals:  Bilateral vertebral arteries demonstrate antegrade flow. Subclavians: Normal flow hemodynamics were seen in bilateral subclavian              arteries. *See table(s) above for measurements and observations.  Electronically signed by Coral Else MD on 01/14/2020 at 5:39:43 PM.    Final     Medications:  Scheduled: . busPIRone  10 mg Oral TID  . Chlorhexidine Gluconate Cloth  6 each Topical Q0600  . ciprofloxacin  500 mg Oral BID  . DULoxetine  60 mg Oral Daily  . folic acid  1 mg Oral Daily  . furosemide  40 mg Oral Daily  . gabapentin  300 mg Oral BID  . insulin aspart  0-9 Units Subcutaneous Q4H  . mouth rinse  15 mL Mouth Rinse BID  . multivitamin with minerals  1 tablet Oral Daily  . pantoprazole  40 mg Oral BID AC  . pneumococcal 23 valent vaccine  0.5 mL Intramuscular Tomorrow-1000  . sodium chloride flush  3 mL Intravenous Q12H  . spironolactone  100 mg Oral Daily  . sucralfate   1 g Oral TID WC & HS  . thiamine  100 mg Oral Daily   Or  . thiamine  100 mg Intravenous Daily   Continuous: . lactated ringers 10 mL/hr at 01/14/20 1227    Assessment/Plan: 1) Esophageal varices s/p banding. 2) Anemia - stable. 3) LA Grade D esophagitis. 4) Scrotal swelling.   The  patient admitted to drinking again over the past 3 weeks.  He is struggling with depression and his marriage is very difficult.  The resumption of ETOH is the reason for his recent cirrhotic decompensation.  During his previous admission scrotal swelling was an issue and this was secondary to the IV fluids and his cirrhosis.  He will be restarted on diuretics.  Plan: 1) Step I diuretics. 2) 2 gram sodium diet. 3) Change pantoprazole to PO BID. 4) D/C octreotide. 5) Change Cipro to oral and complete a total of 7 days. 6) D/C lactated ringers. 7) Follow electrolytes on diuretics.  LOS: 3 days   Hibba Schram D 01/16/2020, 8:39 AM

## 2020-01-16 NOTE — Progress Notes (Signed)
Patient c/o scrotal pain and swelling. I assessed the perineal area and the swelling is mostly to the left side of the scrotum with a little redness. AMION page sent to on-call attending M. Katherina Right, awaiting response. Will continue to monitor the patient.

## 2020-01-16 NOTE — Progress Notes (Signed)
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 Loreta Ave consulted by ED started on Protonix and octreotide gtt. Patient underwent repeat EGD 8/24-large esophageal varices, grade D esophagitis and duodenal ulcer without bleeding or stigmata banding was done Cardiology consulted  Assessment & Plan:   Active Problems:   Syncope   Alcoholic cirrhosis of liver with ascites (HCC)   AKI (acute kidney injury) (HCC)   Hematemesis   Acute on chronic renal insufficiency   CKD (chronic kidney disease), stage III   DM (diabetes mellitus), type 2 with complications (HCC)  1. Upper GI bleed secondary to large varices with esophagitis a. EGD 8/24 = large varices which were banded b. Patient currently on oral Protonix twice daily, regular diet c. Complete Cipro 8/29 2. Syncope a. Appreciate cardiology input-we will need Zio patch on discharge which will be mailed to him b. Echo showed mild aortic stenosis LVEF 60-65% carotids are normal 3. Hypokalemia a. Resolved 4. Recent lumbar surgery a. Continue Norco 1-2 tabs every 4 as needed Tylenol as first choice for pain b. Outpatient follow-up with neurosurgery 5. HCV status post  treatment 6. Cirrhosis secondary to alcohol complicated by varices as above a. Discontinue alcohol protocol b. Aldactone Lasix 100: 40 resumed 8/26 c. Slight increase in transaminitis secondary to his underlying cirrhosis-monitor trends over next several days 7. Large hernia/varicocele? a. Occurs with worsening of his ascites b. Place truss, RN is aware 8. CKD 3 a. Creatinine is greatly improved since admission  b. Monitor trends in a.m. 9. Lumbar laminectomy 12/15/2019 10. Recurrent pleural effusions right-sided 11. Prior heavy drinker  DVT prophylaxis: SCD Code Status: Full none Family Communication: None  disposition: Inpatient  Status is: Inpatient  Remains inpatient appropriate because:Hemodynamically unstable   Dispo: The patient is from: Home              Anticipated d/c is to: Home              Anticipated d/c date is: 1 day              Patient currently is not medically stable to d/c.  Consultants:   GI  Cardiology  Procedures: EGD  Antimicrobials: None  Subjective: Well Some groin pain No cp fever   Objective: Vitals:   01/15/20 2006 01/15/20 2012 01/16/20 0019 01/16/20 0357  BP: (!) 154/105 (!) 146/96 (!) 153/95 139/88  Pulse: 99 97 98 (!) 102  Resp: 18 18 19 18   Temp: 98.1 F (36.7 C) 98.1 F (36.7 C) 97.8 F (36.6 C) 98.1 F (36.7 C)  TempSrc:      SpO2: 95%  97% 94%  Weight:      Height:        Intake/Output Summary (Last 24 hours) at 01/16/2020 1314 Last data filed at 01/16/2020 1040 Gross per 24 hour  Intake 2534.27  ml  Output 3000 ml  Net -465.73 ml   Filed Weights   01/13/20 1738 01/14/20 1214 01/14/20 2100  Weight: 90.7 kg 90.7 kg 96.9 kg    Examination:  General exam: well Respiratory system: Clear no added sound no rales no rhonchi Cardiovascular system: S1-S2 slight tachy Gastrointestinal system: Soft slightly bloated nondistended no rebound no guarding. Large groin Central nervous system: Intact no focal  deficit Extremities: Soft extremities with no joint swellings or limitations to motion no lower extremity edema Skin: No lower extremity edema Psychiatry: Euthymic congruent  Data Reviewed: I have personally reviewed following labs and imaging studies Potassium 3.1 sodium 133 BUNs/creatinine down from admission 46/1.4-->31/1.43-->14/1.18   Radiology Studies: No results found.   Scheduled Meds: . busPIRone  10 mg Oral TID  . Chlorhexidine Gluconate Cloth  6 each Topical Q0600  . ciprofloxacin  500 mg Oral BID  . DULoxetine  60 mg Oral Daily  . furosemide  40 mg Oral Daily  . gabapentin  300 mg Oral BID  . insulin aspart  0-9 Units Subcutaneous Q4H  . mouth rinse  15 mL Mouth Rinse BID  . pantoprazole  40 mg Oral BID AC  . pneumococcal 23 valent vaccine  0.5 mL Intramuscular Tomorrow-1000  . sodium chloride flush  3 mL Intravenous Q12H  . spironolactone  100 mg Oral Daily  . sucralfate  1 g Oral TID WC & HS   Continuous Infusions:    LOS: 3 days    Time spent: 25 minutes  Rhetta Mura, MD [facility] To contact the attending provider between 7A-7P or the covering provider during after hours 7P-7A, please log into the web site [email or web address] and access using universal  password for that web site. If you do not have the password, please call the hospital operator.  01/16/2020, 1:14 PM

## 2020-01-16 NOTE — Progress Notes (Signed)
Ordered 2 week live Zio monitor for further evaluation of syncope per Dr. Bjorn Pippin. Please see rounding note from today for more details.  Corrin Parker, PA-C 01/16/2020 3:16 PM

## 2020-01-16 NOTE — TOC Initial Note (Signed)
Transition of Care [facility]) - Initial/Assessment Note    Patient Details  Name: Connor Vaughn MRN: [id number] Date of Birth: 09-21-1960  [facility]) CM/SW Contact:    Connor Rogue, LCSW Phone Number: 01/16/2020, 11:50 AM  Clinical Narrative:   Patient seen in follow up to MD consult for SA counseling.  Asked him for clarification, as H&P stated, in various places, "stopped drinking," "drinks on occasion," and "when not drinking shakes, and is feeling shaky,"  And there was no BAC registered for this visit. Connor Vaughn is eager to talk, explains that he used to be a 7 day a week drinker, had cut back to a couple drinks a week, but had a recent relapse and binged.  Went on to talk about his multiple stressors and triggers, including a marriage in which "we do not get along worth a hoot," financial worries as he has no income and has quickly accumulating medical bills, and depression/anxiety.  He cited supports of a neighbor/friend  Who supports him in his sobriety, as well as a church friend.  He declined any referrals for AA meetings or formal supports, stating he feels he can reach out to the above supports when need be. He uses a cane or walker at home for ambulation, and needs no other DME. No other needs identified.  TOC will continue to follow during the course of hospitalization.             Expected Discharge Plan: Home/Self Care Barriers to Discharge: No Barriers Identified   Patient Goals and CMS Choice Patient states their goals for this hospitalization and ongoing recovery are:: "I hope to get out of here soon."      Expected Discharge Plan and Services Expected Discharge Plan: Home/Self Care In-house Referral: Clinical Social Work     Living arrangements for the past 2 months: Single Family Home                                      Prior Living Arrangements/Services Living arrangements for the past 2 months: Single Family Home Lives with::  Spouse Patient language and need for interpreter reviewed:: Yes Do you feel safe going back to the place where you live?: Yes      Need for Family Participation in Patient Care: No (Comment) Care giver support system in place?: Yes (comment) Current home services: DME Criminal Activity/Legal Involvement Pertinent to Current Situation/Hospitalization: No - Comment as needed  Activities of Daily Living Home Assistive Devices/Equipment: Eyeglasses ADL Screening (condition at time of admission) Patient's cognitive ability adequate to safely complete daily activities?: Yes Is the patient deaf or have difficulty hearing?: No Does the patient have difficulty seeing, even when wearing glasses/contacts?: No Does the patient have difficulty concentrating, remembering, or making decisions?: No Patient able to express need for assistance with ADLs?: Yes Does the patient have difficulty dressing or bathing?: No Independently performs ADLs?: Yes (appropriate for developmental age) Does the patient have difficulty walking or climbing stairs?: No Weakness of Legs: None Weakness of Arms/Hands: None  Permission Sought/Granted                  Emotional Assessment Appearance:: Appears stated age Attitude/Demeanor/Rapport: Engaged Affect (typically observed): Appropriate Orientation: : Oriented to Self, Oriented to Place, Oriented to  Time, Oriented to Situation Alcohol / Substance Use: Not Applicable Psych Involvement: No (comment)  Admission diagnosis:  Hematemesis [K92.0] Upper GI bleed [K92.2] Patient Active Problem List   Diagnosis Date Noted  . Hematemesis 01/13/2020  . Acute on chronic renal insufficiency 01/13/2020  . CKD (chronic kidney disease), stage III 01/13/2020  . DM (diabetes mellitus), type 2 with complications (HCC) 01/13/2020  . Neurogenic claudication due to lumbar spinal stenosis 12/15/2019  . AKI (acute kidney injury) (HCC) 12/12/2019  . DKA (diabetic ketoacidoses)  (HCC) 04/04/2019  . Acute blood loss anemia 04/04/2019  . Pancreatitis 04/04/2019  . Lactic acidosis 04/04/2019  . Encounter for central line placement   . Sepsis with acute renal failure without septic shock (HCC)   . Upper GI bleed   . Recurrent right pleural effusion 01/10/2016  . Alcoholic cirrhosis of liver with ascites (HCC)   . Pleural effusion 12/24/2015  . Pleural effusion, right 12/24/2015  . Alcohol abuse 12/24/2015  . Syncope 12/24/2015   PCP:  Connor Vaughn., MD Pharmacy:   [facility] [address] [place] - [address] [facility] [place] [address] Phone: [phone] Fax: [phone]     Social Determinants of Health (SDOH) Interventions    Readmission Risk Interventions No flowsheet data found.

## 2020-01-17 LAB — CBC WITH DIFFERENTIAL/PLATELET
Abs Immature Granulocytes: 0.03 10*3/uL (ref 0.00–0.07)
Basophils Absolute: 0 10*3/uL (ref 0.0–0.1)
Basophils Relative: 1 %
Eosinophils Absolute: 0.2 10*3/uL (ref 0.0–0.5)
Eosinophils Relative: 4 %
HCT: 26.7 % — ABNORMAL LOW (ref 39.0–52.0)
Hemoglobin: 8.7 g/dL — ABNORMAL LOW (ref 13.0–17.0)
Immature Granulocytes: 1 %
Lymphocytes Relative: 31 %
Lymphs Abs: 1.4 10*3/uL (ref 0.7–4.0)
MCH: 29.9 pg (ref 26.0–34.0)
MCHC: 32.6 g/dL (ref 30.0–36.0)
MCV: 91.8 fL (ref 80.0–100.0)
Monocytes Absolute: 0.6 10*3/uL (ref 0.1–1.0)
Monocytes Relative: 13 %
Neutro Abs: 2.3 10*3/uL (ref 1.7–7.7)
Neutrophils Relative %: 50 %
Platelets: 93 10*3/uL — ABNORMAL LOW (ref 150–400)
RBC: 2.91 MIL/uL — ABNORMAL LOW (ref 4.22–5.81)
RDW: 16.1 % — ABNORMAL HIGH (ref 11.5–15.5)
WBC: 4.6 10*3/uL (ref 4.0–10.5)
nRBC: 0 % (ref 0.0–0.2)

## 2020-01-17 LAB — BPAM RBC
Blood Product Expiration Date: 202109222359
Blood Product Expiration Date: 202109222359
Blood Product Expiration Date: 202109222359
Blood Product Expiration Date: 202109222359
ISSUE DATE / TIME: 202108232352
ISSUE DATE / TIME: 202108240225
Unit Type and Rh: 5100
Unit Type and Rh: 5100
Unit Type and Rh: 5100
Unit Type and Rh: 5100

## 2020-01-17 LAB — COMPREHENSIVE METABOLIC PANEL
ALT: 148 U/L — ABNORMAL HIGH (ref 0–44)
AST: 171 U/L — ABNORMAL HIGH (ref 15–41)
Albumin: 3.1 g/dL — ABNORMAL LOW (ref 3.5–5.0)
Alkaline Phosphatase: 79 U/L (ref 38–126)
Anion gap: 6 (ref 5–15)
BUN: 13 mg/dL (ref 6–20)
CO2: 30 mmol/L (ref 22–32)
Calcium: 8 mg/dL — ABNORMAL LOW (ref 8.9–10.3)
Chloride: 101 mmol/L (ref 98–111)
Creatinine, Ser: 1.26 mg/dL — ABNORMAL HIGH (ref 0.61–1.24)
GFR calc Af Amer: >60 mL/min (ref >60)
GFR calc non Af Amer: >60 mL/min (ref >60)
Glucose, Bld: 225 mg/dL — ABNORMAL HIGH (ref 70–99)
Potassium: 3.7 mmol/L (ref 3.5–5.1)
Sodium: 137 mmol/L (ref 135–145)
Total Bilirubin: 0.6 mg/dL (ref 0.3–1.2)
Total Protein: 5.1 g/dL — ABNORMAL LOW (ref 6.5–8.1)

## 2020-01-17 LAB — TYPE AND SCREEN
ABO/RH(D): O POS
Antibody Screen: NEGATIVE
Unit division: 0
Unit division: 0
Unit division: 0
Unit division: 0

## 2020-01-17 LAB — GLUCOSE, CAPILLARY
Glucose-Capillary: 198 mg/dL — ABNORMAL HIGH (ref 70–99)
Glucose-Capillary: 199 mg/dL — ABNORMAL HIGH (ref 70–99)
Glucose-Capillary: 215 mg/dL — ABNORMAL HIGH (ref 70–99)
Glucose-Capillary: 222 mg/dL — ABNORMAL HIGH (ref 70–99)
Glucose-Capillary: 225 mg/dL — ABNORMAL HIGH (ref 70–99)
Glucose-Capillary: 254 mg/dL — ABNORMAL HIGH (ref 70–99)

## 2020-01-17 NOTE — Progress Notes (Addendum)
Inpatient Diabetes Program Recommendations  AACE/ADA: New Consensus Statement on Inpatient Glycemic Control (2015)  Target Ranges:  Prepandial:   less than 140 mg/dL      Peak postprandial:   less than 180 mg/dL (1-2 hours)      Critically ill patients:  140 - 180 mg/dL   Lab Results  Component Value Date   GLUCAP 215 (H) 01/17/2020   HGBA1C 6.7 (H) 01/13/2020    Review of Glycemic Control Results for Connor Vaughn, Connor Vaughn (MRN [id number]) as of 01/17/2020 10:09  Ref. Range 01/16/2020 07:46 01/16/2020 11:52 01/16/2020 16:10 01/16/2020 20:53 01/16/2020 23:56 01/17/2020 04:03 01/17/2020 07:55  Glucose-Capillary Latest Ref Range: 70 - 99 mg/dL 111 (H) 552 (H) 080 (H) 225 (H) 175 (H) 222 (H) 215 (H)   Diabetes history: DM 2 Outpatient Diabetes medications: Glipizide 10 mg bid Current orders for Inpatient glycemic control:  Novolog 0-9 units Q4 hours  Creat/BUN: 1.26/13 Hgb 8.7  Inpatient Diabetes Program Recommendations:    -Consider Levemir 8 units   Thanks,  Christena Deem RN, MSN, BC-ADM Inpatient Diabetes Coordinator Team Pager [phone] (8a-5p)

## 2020-01-17 NOTE — Progress Notes (Signed)
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 Loreta Ave consulted by ED started on Protonix and octreotide gtt. Patient underwent repeat EGD 8/24-large esophageal varices, grade D esophagitis and duodenal ulcer without bleeding or stigmata banding was done Cardiology consulted  Assessment & Plan:   Active Problems:   Syncope   Alcoholic cirrhosis of liver with ascites (HCC)   AKI (acute kidney injury) (HCC)   Hematemesis   Acute on chronic renal insufficiency   CKD (chronic kidney disease), stage III   DM (diabetes mellitus), type 2 with complications (HCC)  1. Upper GI bleed secondary to large varices with esophagitis a. EGD 8/24 = large varices which were banded b. Patient currently on oral Protonix twice daily, regular diet although this is limited a little bit by some nausea--will follow and see how he does today on liquids--encouraged him to eat bfast in am c. Complete Cipro 8/29 2. Syncope a. Appreciate cardiology input-we will need Zio patch on discharge which will be mailed to him b. Echo showed mild aortic stenosis LVEF 60-65% carotids are normal c. No further inpatient  work-up 3. Hypokalemia a. Resolved-repeat labs a.m. 4. Recent lumbar surgery a. Continue Norco 1-2 tabs every 4 as needed Tylenol as first choice for pain b. Outpatient follow-up with neurosurgery 5. HCV status post treatment 6. Cirrhosis secondary to alcohol complicated by varices as above a. Discontinue alcohol protocol b. Aldactone Lasix 100: 40 resumed 8/26 c. LFTs continue to rise slightly d. We will get an INR in the morning 7. Large hernia/varicocele? a. Occurs with worsening of his ascites b. Place truss, RN is aware 8. CKD 3 a. Creatinine is greatly improved since admission  b. Monitor trends as is now back on diuretics 9. Lumbar laminectomy 12/15/2019 10. Recurrent pleural effusions right-sided 11. Prior heavy drinker  DVT prophylaxis: SCD Code Status: Full none Family Communication: None  disposition: Inpatient  Status is: Inpatient  Remains inpatient appropriate because:Hemodynamically unstable   Dispo: The patient is from: Home              Anticipated d/c is to: Home              Anticipated d/c date is: 1 day              Patient currently is not medically stable to d/c.  Consultants:   GI  Cardiology  Procedures: EGD  Antimicrobials: None  Subjective: Nurse reported nausea this morning No chest pain no fever or chills Otherwise fair No cp no chills     Objective: Vitals:   01/16/20 0357 01/16/20 1355 01/16/20 2152 01/17/20 0401  BP: 139/88 (!) 144/95 (!) 150/91 (!) 146/95  Pulse: (!) 102  100 99  Resp: 18 16 16 16   Temp: 98.1 F (36.7 C) 97.9 F (36.6 C) (!) 97.5 F (36.4 C) 97.6 F (  36.4 C)  TempSrc:  Oral    SpO2: 94% 96% 100% 99%  Weight:      Height:        Intake/Output Summary (Last 24 hours) at 01/17/2020 1125 Last data filed at 01/17/2020 0801 Gross per 24 hour  Intake 240 ml  Output 2650 ml  Net -2410 ml   Filed Weights   01/13/20 1738 01/14/20 1214 01/14/20 2100  Weight: 90.7 kg 90.7 kg 96.9 kg     Examination:  General exam: well Respiratory system: Clear no added sound no rales no rhonchi Cardiovascular system: S1-S2 slight tachy Gastrointestinal system: Soft slightly bloated nondistended no rebound no guarding. Large groin Central nervous system: Intact no focal deficit Extremities: Soft extremities with no joint swellings or limitations to motion no lower extremity edema Skin: No lower extremity edema Psychiatry: Euthymic congruent  Data Reviewed: I have personally reviewed following labs and imaging studies Potassium 3.7 currently BUNs/creatinine 13/1.2 LFTs are up from admission 171/148 bilirubin 0.6  Radiology Studies: No results found.   Scheduled Meds:  busPIRone  10 mg Oral TID   Chlorhexidine Gluconate Cloth  6 each Topical Q0600   ciprofloxacin  500 mg Oral BID   DULoxetine  60 mg Oral Daily   furosemide  40 mg Oral Daily   gabapentin  300 mg Oral BID   insulin aspart  0-9 Units Subcutaneous Q4H   mouth rinse  15 mL Mouth Rinse BID   pantoprazole  40 mg Oral BID AC   pneumococcal 23 valent vaccine  0.5 mL Intramuscular Tomorrow-1000   sodium chloride flush  3 mL Intravenous Q12H   spironolactone  100 mg Oral Daily   sucralfate  1 g Oral TID WC & HS   Continuous Infusions:    LOS: 4 days    Time spent: 35 minutes  Rhetta Mura, MD [facility] To contact the attending provider between 7A-7P or the covering provider during after hours 7P-7A, please log into the web site [email or web address] and access using universal [place] password for that web site. If you do not have the password, please call the hospital operator.  01/17/2020, 11:25 AM

## 2020-01-17 NOTE — Progress Notes (Signed)
Subjective: Scrotal swelling subjectively improved.  Some nausea this AM.  Objective: Vital signs in last 24 hours: Temp:  [97.5 F (36.4 C)-97.9 F (36.6 C)] 97.6 F (36.4 C) (08/27 0401) Pulse Rate:  [99-100] 99 (08/27 0401) Resp:  [16] 16 (08/27 0401) BP: (144-150)/(91-95) 146/95 (08/27 0401) SpO2:  [96 %-100 %] 99 % (08/27 0401) Last BM Date: 01/16/20  Intake/Output from previous day: 08/26 0701 - 08/27 0700 In: 1170 [P.O.:1170] Out: 2800 [Urine:2800] Intake/Output this shift: Total I/O In: -  Out: 500 [Urine:500]  General appearance: alert and no distress GI: soft, non-tender; bowel sounds normal; no masses,  no organomegaly  Lab Results: Recent Labs    01/15/20 0210 01/16/20 0259 01/17/20 0333  WBC 10.4 6.9 4.6  HGB 9.7* 9.3* 8.7*  HCT 28.2* 27.8* 26.7*  PLT 94* 106* 93*   BMET Recent Labs    01/15/20 0210 01/16/20 0259 01/17/20 0333  NA 133* 132* 137  K 3.1* 3.8 3.7  CL 96* 98 101  CO2 27 26 30   GLUCOSE 207* 171* 225*  BUN 31* 14 13  CREATININE 1.43* 1.18 1.26*  CALCIUM 8.0* 8.1* 8.0*   LFT Recent Labs    01/17/20 0333  PROT 5.1*  ALBUMIN 3.1*  AST 171*  ALT 148*  ALKPHOS 79  BILITOT 0.6   PT/INR No results for input(s): LABPROT, INR in the last 72 hours. Hepatitis Panel No results for input(s): HEPBSAG, HCVAB, HEPAIGM, HEPBIGM in the last 72 hours. C-Diff No results for input(s): CDIFFTOX in the last 72 hours. Fecal Lactopherrin No results for input(s): FECLLACTOFRN in the last 72 hours.  Studies/Results: No results found.  Medications:  Scheduled: . busPIRone  10 mg Oral TID  . Chlorhexidine Gluconate Cloth  6 each Topical Q0600  . ciprofloxacin  500 mg Oral BID  . DULoxetine  60 mg Oral Daily  . furosemide  40 mg Oral Daily  . gabapentin  300 mg Oral BID  . insulin aspart  0-9 Units Subcutaneous Q4H  . mouth rinse  15 mL Mouth Rinse BID  . pantoprazole  40 mg Oral BID AC  . pneumococcal 23 valent vaccine  0.5 mL  Intramuscular Tomorrow-1000  . sodium chloride flush  3 mL Intravenous Q12H  . spironolactone  100 mg Oral Daily  . sucralfate  1 g Oral TID WC & HS   Continuous:   Assessment/Plan: 1) Esophageal variceal bleed s/p banding. 2) ETOH cirrhosis. 3) Scrotal swelling secondary to cirrhosis and IV fluids. 4) Syncope - Work up per Cardiology.   Objectively, there is no change with the scrotal swelling.  He does complain about testicular pain and swelling in his right inguinal region.  Evaluation was negative for any herniation.  His electrolytes are stable, but there is slight increase in his creatinine.  There is a minor drop in his HGB, but there is no overt evidence of bleeding.  Plan: 1) Continue with diuretics. 2) Maintain 2 gram sodium diet. 3) Monitor HGB and transfuse if necessary.  LOS: 4 days   Cassandr Cederberg D 01/17/2020, 11:02 AM

## 2020-01-18 DIAGNOSIS — I8511 Secondary esophageal varices with bleeding: Secondary | ICD-10-CM

## 2020-01-18 DIAGNOSIS — D62 Acute posthemorrhagic anemia: Secondary | ICD-10-CM

## 2020-01-18 DIAGNOSIS — N5089 Other specified disorders of the male genital organs: Secondary | ICD-10-CM

## 2020-01-18 LAB — COMPREHENSIVE METABOLIC PANEL
ALT: 119 U/L — ABNORMAL HIGH (ref 0–44)
AST: 98 U/L — ABNORMAL HIGH (ref 15–41)
Albumin: 3.2 g/dL — ABNORMAL LOW (ref 3.5–5.0)
Alkaline Phosphatase: 75 U/L (ref 38–126)
Anion gap: 10 (ref 5–15)
BUN: 11 mg/dL (ref 6–20)
CO2: 31 mmol/L (ref 22–32)
Calcium: 8.3 mg/dL — ABNORMAL LOW (ref 8.9–10.3)
Chloride: 94 mmol/L — ABNORMAL LOW (ref 98–111)
Creatinine, Ser: 0.95 mg/dL (ref 0.61–1.24)
GFR calc Af Amer: >60 mL/min (ref >60)
GFR calc non Af Amer: >60 mL/min (ref >60)
Glucose, Bld: 162 mg/dL — ABNORMAL HIGH (ref 70–99)
Potassium: 3.3 mmol/L — ABNORMAL LOW (ref 3.5–5.1)
Sodium: 135 mmol/L (ref 135–145)
Total Bilirubin: 1 mg/dL (ref 0.3–1.2)
Total Protein: 5.4 g/dL — ABNORMAL LOW (ref 6.5–8.1)

## 2020-01-18 LAB — CBC WITH DIFFERENTIAL/PLATELET
Abs Immature Granulocytes: 0.02 10*3/uL (ref 0.00–0.07)
Basophils Absolute: 0 10*3/uL (ref 0.0–0.1)
Basophils Relative: 1 %
Eosinophils Absolute: 0.2 10*3/uL (ref 0.0–0.5)
Eosinophils Relative: 4 %
HCT: 27 % — ABNORMAL LOW (ref 39.0–52.0)
Hemoglobin: 8.9 g/dL — ABNORMAL LOW (ref 13.0–17.0)
Immature Granulocytes: 0 %
Lymphocytes Relative: 26 %
Lymphs Abs: 1.2 10*3/uL (ref 0.7–4.0)
MCH: 29.7 pg (ref 26.0–34.0)
MCHC: 33 g/dL (ref 30.0–36.0)
MCV: 90 fL (ref 80.0–100.0)
Monocytes Absolute: 0.7 10*3/uL (ref 0.1–1.0)
Monocytes Relative: 14 %
Neutro Abs: 2.5 10*3/uL (ref 1.7–7.7)
Neutrophils Relative %: 55 %
Platelets: 91 10*3/uL — ABNORMAL LOW (ref 150–400)
RBC: 3 MIL/uL — ABNORMAL LOW (ref 4.22–5.81)
RDW: 16.6 % — ABNORMAL HIGH (ref 11.5–15.5)
WBC: 4.5 10*3/uL (ref 4.0–10.5)
nRBC: 0 % (ref 0.0–0.2)

## 2020-01-18 LAB — GLUCOSE, CAPILLARY
Glucose-Capillary: 170 mg/dL — ABNORMAL HIGH (ref 70–99)
Glucose-Capillary: 157 mg/dL — ABNORMAL HIGH (ref 70–99)
Glucose-Capillary: 297 mg/dL — ABNORMAL HIGH (ref 70–99)

## 2020-01-18 LAB — PROTIME-INR
INR: 1.1 (ref 0.8–1.2)
Prothrombin Time: 13.7 seconds (ref 11.4–15.2)

## 2020-01-18 MED ORDER — SUCRALFATE 1 G PO TABS: 1.0000 g | ORAL_TABLET | Freq: Three times a day (TID) | ORAL | 0 refills | Status: DC

## 2020-01-18 MED ORDER — METHOCARBAMOL 500 MG PO TABS: 500.0000 mg | ORAL_TABLET | Freq: Three times a day (TID) | ORAL | 0 refills | Status: DC | PRN

## 2020-01-18 MED ORDER — PANTOPRAZOLE SODIUM 40 MG PO TBEC
40.0000 mg | DELAYED_RELEASE_TABLET | Freq: Two times a day (BID) | ORAL | 2 refills | Status: DC
Start: 2020-01-18 — End: 2020-04-22

## 2020-01-18 MED ORDER — POTASSIUM CHLORIDE CRYS ER 20 MEQ PO TBCR
40.0000 meq | EXTENDED_RELEASE_TABLET | Freq: Two times a day (BID) | ORAL | Status: DC
  Administered 2020-01-18: 40 meq via ORAL
  Filled 2020-01-18: qty 2

## 2020-01-18 MED ORDER — POTASSIUM CHLORIDE CRYS ER 20 MEQ PO TBCR: 40.0000 meq | EXTENDED_RELEASE_TABLET | Freq: Two times a day (BID) | ORAL | 0 refills | Status: DC

## 2020-01-18 MED ORDER — ALUM & MAG HYDROXIDE-SIMETH 200-200-20 MG/5ML PO SUSP: 15.0000 mL | ORAL | 0 refills | Status: DC | PRN

## 2020-01-18 NOTE — Progress Notes (Addendum)
Daily Rounding Note for Dr Jeani Hawking  01/18/2020, 8:31 AM  LOS: 5 days   SUBJECTIVE:   Chief complaint:  Cirrhosis, variceal bleed, scrotal edema    Nausea and dry heaves yest, non-bloody scant emesis.  Diet regressed to full liquids.  Pt wants to try solids again No BM's > 2 days Chronic scrotal edema is better  OBJECTIVE:         Vital signs in last 24 hours:    Temp:  [97.8 F (36.6 C)-98.3 F (36.8 C)] 97.9 F (36.6 C) (08/28 0401) Pulse Rate:  [99-109] 99 (08/28 0401) Resp:  [16-20] 16 (08/28 0401) BP: (138-145)/(88-93) 144/93 (08/28 0401) SpO2:  [97 %-98 %] 97 % (08/28 0401) Last BM Date: 01/16/20 Filed Weights   01/13/20 1738 01/14/20 1214 01/14/20 2100  Weight: 90.7 kg 90.7 kg 96.9 kg   General: looks chronically ill.  Comfortable, alert, fluent speech, conversational and pleasant Heart: RRR Chest: clear bil Abdomen: soft, NT, ND.  Fullness/firm across upper abdomen scrotum about softball sized w/o erythema.    Extremities: minor, non-pitting pedal edema Neuro/Psych:  Plaeasant, talkative, fully alert and oriented.  Slightly anxious.  No tremor or weakness.    Intake/Output from previous day: 08/27 0701 - 08/28 0700 In: -  Out: 4350 [Urine:4350]  Intake/Output this shift: No intake/output data recorded.  Lab Results: Recent Labs    01/16/20 0259 01/17/20 0333 01/18/20 0456  WBC 6.9 4.6 4.5  HGB 9.3* 8.7* 8.9*  HCT 27.8* 26.7* 27.0*  PLT 106* 93* 91*   BMET Recent Labs    01/16/20 0259 01/17/20 0333 01/18/20 0456  NA 132* 137 135  K 3.8 3.7 3.3*  CL 98 101 94*  CO2 26 30 31   GLUCOSE 171* 225* 162*  BUN 14 13 11   CREATININE 1.18 1.26* 0.95  CALCIUM 8.1* 8.0* 8.3*   LFT Recent Labs    01/16/20 0259 01/17/20 0333 01/18/20 0456  PROT 5.4* 5.1* 5.4*  ALBUMIN 3.4* 3.1* 3.2*  AST 147* 171* 98*  ALT 113* 148* 119*  ALKPHOS 73 79 75  BILITOT 1.1 0.6 1.0   PT/INR Recent  Labs    01/18/20 0456  LABPROT 13.7  INR 1.1   Hepatitis Panel No results for input(s): HEPBSAG, HCVAB, HEPAIGM, HEPBIGM in the last 72 hours.  Studies/Results: No results found.   Scheduled Meds: . busPIRone  10 mg Oral TID  . Chlorhexidine Gluconate Cloth  6 each Topical Q0600  . ciprofloxacin  500 mg Oral BID  . DULoxetine  60 mg Oral Daily  . furosemide  40 mg Oral Daily  . gabapentin  300 mg Oral BID  . insulin aspart  0-9 Units Subcutaneous Q4H  . mouth rinse  15 mL Mouth Rinse BID  . pantoprazole  40 mg Oral BID AC  . pneumococcal 23 valent vaccine  0.5 mL Intramuscular Tomorrow-1000  . sodium chloride flush  3 mL Intravenous Q12H  . spironolactone  100 mg Oral Daily  . sucralfate  1 g Oral TID WC & HS   Continuous Infusions: PRN Meds:.alum & mag hydroxide-simeth, HYDROcodone-acetaminophen, methocarbamol, ondansetron **OR** ondansetron (ZOFRAN) IV   ASSESMENT:   * UGIB 8/24 EGD w banding large esoph varices. Current mgt w Protonix 40 po bid, Carafate AC/HS   D 6 Cipro (IV >> po), presume to SBP prophylaxis in setting of GIB  *   Blood loss anemia.   Hgb 5.3 >> 10.3 >> 8.9.  2 PRBCs , 8/24 x 1, 8/26 x 1.    *   Thrombocytopenia.  Non-critical.    *   Hypokalemia.    *   Scrotal edema, chronic.  On diuretics.  No recent ultrasound, CT, MR imaging of abdomen  Scrotal wall thickening per [place] 03/2019.   Minor ascites per 03/2019 ultrasound Aldactone 100/day, Lasxi 40/daily in place   *    Cirrhosis.  Treated HCV.  Recovering ETOH.    *   Large abd hernia vs varicocele.    *   CKD 3.  Creat improved 1.26 >> 0.95 last 24 hours.    *    DM  *   Lumbar laminectomy 12/15/19.    *   Diastolic hypertension.     PLAN   *   Stop cipro, has completed expected 5 d abx for SBP prophy 2 gm Na diet Replete potassium per Dr Mahala Menghini Follow CMET and CBC Ambulate w assistance.  If dose ok couold go home later today after receiving potassium.   Advance to 2 gm  Na, carb mod diet.     Connor Vaughn  01/18/2020, 8:31 AM Phone [phone]  I have discussed the case with the PA, and that is the plan I formulated. I personally interviewed and examined the patient.  No further GI bleeding.  Chronic scrotal edema that will take some time to improve on diuretics and sodium restriction. Mild hypokalemia. Underlying alcohol-related cirrhosis.  Patient expressed some concerns to me about the plan for discharge today because he still feels dizzy at times, has not been able to try solid food yet, feels a dry scratchy throat and some difficulty swallowing large tablets, he wants to make sure he can walk around some more to be stable at home.  He is stable from a GI perspective as long as he can tolerate some solid foods.  I have reassured him there is no obstruction in the esophagus.  I believe he will be able to tolerate a diet without difficulty.  If so, he can be discharged from a GI perspective if considered safe for discharge by [facility].  I spoke with Dr. Mahala Menghini because the patient requested a reevaluation by him later in the day to make sure he was safe for discharge.  Dr. Elnoria Howard will arrange follow-up regarding diuretic management and consideration of secondary variceal bleeding prophylaxis with beta-blockers.  Total time 35 minutes.  Extensive chart review and conversations required.  Charlie Pitter III Office: [phone]

## 2020-01-18 NOTE — Discharge Summary (Signed)
Physician Discharge Summary  Connor Vaughn ZOX:[id number] DOB: [birth year] DOA: 01/13/2020  PCP: Nonnie Done., MD  Admit date: 01/13/2020 Discharge date: 01/18/2020  Time spent: 45 minutes  Recommendations for Outpatient Follow-up:  1. Continue PPI as well as Carafate in the outpatient setting and will need screening endoscopy again in several months probably as per Dr. Elnoria Howard 2. Needs outpatient labs including CBC Chem-12 probably in 1 week 3. Recommend th discussing refills of meds with PCP or orthopedic surgeon who did his procedure 4. Cardiology will mail him a Zio patch on discharge to ensure that his syncope is worked up   Discharge Diagnoses:  Active Problems:   Syncope   Alcoholic cirrhosis of liver with ascites (HCC)   AKI (acute kidney injury) (HCC)   Hematemesis   Acute on chronic renal insufficiency   CKD (chronic kidney disease), stage III   DM (diabetes mellitus), type 2 with complications [facility])   Discharge Condition: Improved  Diet recommendation: Heart healthy  Filed Weights   01/13/20 1738 01/14/20 1214 01/14/20 2100  Weight: 90.7 kg 90.7 kg 96.9 kg    History of present illness:  59 year old white male Recent admission/discharge 12/11/2019-12/17/2019 for accidental fall and had back surgery at the time HCV status post treatment, 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 Loreta Ave consulted by ED started on Protonix and octreotide gtt. Patient underwent repeat  EGD 8/24-large esophageal varices, grade D esophagitis and duodenal ulcer without bleeding or stigmata banding was done Cardiology consulted  Hospital Course:  1. Upper GI bleed secondary to large varices with esophagitis a. EGD 8/24 = large varices which were banded b. Patient currently on oral Protonix twice daily, regular diet  being tolerated a little better today in no distress c. Completed Cipro 8/28 and ready for discharge 2. Syncope a. Appreciate cardiology input-we will need Zio patch on discharge which will be mailed to him b. Echo showed mild aortic stenosis LVEF 60-65% carotids are normal c. No further inpatient work-up 3. Hypokalemia a. Repleted b. Prescription refill--Potassium replacement in the outpatient setting as is on diuretics 4. Recent lumbar surgery a. Continue Norco 1-2 tabs every 4 as needed Tylenol as first choice for pain--no refills given on discharge b. Outpatient follow-up with neurosurgery 5. HCV status post treatment 6. Cirrhosis secondary to alcohol complicated by varices as above a. Discontinue alcohol protocol b. Aldactone Lasix 100: 40 resumed 8/26 c. LFTs were slightly elevated during hospital stay but stabilized d. INR was 1.1 7. Large hernia/varicocele? a. Occurs with worsening of his ascites b. Place truss if able but is ambulating 8. CKD 3 a. Creatinine is greatly improved since admission  b. Monitor trends as is now back on diuretics 9. Lumbar laminectomy 12/15/2019 10. Recurrent pleural effusions right-sided 11. Prior heavy drinker  Procedures: Multiple endoscopies as above Consultations:  GI  Discharge Exam: Vitals:   01/17/20 1948 01/18/20 0401  BP: (!) 145/88 (!) 144/93  Pulse: (!) 109 99  Resp: 20 16  Temp: 97.8 F (36.6 C) 97.9 F (36.6 C)  SpO2: 97% 97%    General:  Awake alert coherent no distress EOMI NCAT no focal deficit Cardiovascular: S1-S2 no murmur rub or gallop Respiratory: Clinically clear no rales or  rhonchi Abdomen soft nontender no rebound no guarding I did not examine his scrotum today but his belly is soft and he has no shifting dullness Neurologically intact no focal deficit   Discharge Instructions   Discharge Instructions    Diet - low sodium heart healthy   Complete by: As directed    Discharge instructions   Complete by: As directed    Take your medications as indicated You will notice you do not need some your blood pressure meds given your blood pressure was a little low this admission You can continue your other meds without fail Report bleeding to your regular emergency room if there is severe amounts of it You will need an acid reducer twice a day for the next month or so in addition to Carafate Please ensure you get follow-up labs in the outpatient setting with your primary physician in 1 week We will not be prescribing controlled substances unfortunately on discharge-follow-up with your pain physician   Increase activity slowly   Complete by: As directed      Allergies as of 01/18/2020      Reactions   Latex Rash      Medication List    STOP taking these medications   acetaminophen 325 MG tablet Commonly known as: TYLENOL   HYDROcodone-acetaminophen 5-325 MG tablet Commonly known as: NORCO/VICODIN   hydrOXYzine 25 MG tablet Commonly known as: ATARAX/VISTARIL   losartan 50 MG tablet Commonly known as: COZAAR   metoprolol tartrate 50 MG tablet Commonly known as: LOPRESSOR     TAKE these medications   alum & mag hydroxide-simeth 200-200-20 MG/5ML suspension Commonly known as: MAALOX/MYLANTA Take 15 mLs by mouth every 4 (four) hours as needed for indigestion or heartburn.   busPIRone 10 MG tablet Commonly known as: BUSPAR Take 10 mg by mouth 3 (three) times daily.   Cymbalta 60 MG capsule Generic drug: DULoxetine Take 60 mg by mouth daily.   furosemide 40 MG tablet Commonly known as: LASIX Take 1 tablet (40 mg total) by mouth daily.    gabapentin 300 MG capsule Commonly known as: NEURONTIN Take 1 capsule (300 mg total) by mouth 2 (two) times daily.   glipiZIDE 10 MG tablet Commonly known as: GLUCOTROL Take 10 mg by mouth 2 (two) times daily.   methocarbamol 500 MG tablet Commonly known as: ROBAXIN Take 1 tablet (500 mg total) by mouth every 8 (eight) hours as needed for muscle spasms. What changed: Another medication with the same name was removed. Continue taking this medication, and follow the directions you see here.   pantoprazole 40 MG tablet Commonly known as: PROTONIX Take 1 tablet (40 mg total) by mouth 2 (two) times daily before a meal.   potassium chloride SA 20 MEQ tablet Commonly known as: KLOR-CON Take 2 tablets (40 mEq total) by mouth 2 (two) times daily.   spironolactone 25 MG tablet Commonly known as: ALDACTONE Take 1 tablet (25 mg total) by mouth daily.   sucralfate 1 g tablet Commonly known as: CARAFATE Take 1 tablet (1 g total) by mouth 4 (four) times daily -  with meals and at bedtime.      Allergies  Allergen Reactions  . Latex Rash    Follow-up Information    Jodelle GrossLawrence, Kathryn M, NP Follow up.   Specialties: Nurse Practitioner, Radiology, Cardiology Why: Hospital follow-up scheduled for  03/20/2020 at 3:45pm with Lorin Picket, one of Dr. Campbell Lerner NPs. If this date/time does not work for you, please call our office to reschedule. Our office will also call you to help arrange heart monitor. Contact information: [address] [place] [address] [phone]                The results of significant diagnostics from this hospitalization (including imaging, microbiology, ancillary and laboratory) are listed below for reference.    Significant Diagnostic Studies: VAS US CAROTID  Result Date: 01/14/2020 Carotid Arterial Duplex Study Indications:       Syncope. Risk Factors:      Diabetes, past history of smoking. Comparison Study:  No prior studies.  Performing Technologist: Jean Rosenthal  Examination Guidelines: A complete evaluation includes B-mode imaging, spectral Doppler, color Doppler, and power Doppler as needed of all accessible portions of each vessel. Bilateral testing is considered an integral part of a complete examination. Limited examinations for reoccurring indications may be performed as noted.  Right Carotid Findings: +----------+--------+--------+--------+------------------+--------+           PSV cm/sEDV cm/sStenosisPlaque DescriptionComments +----------+--------+--------+--------+------------------+--------+ CCA Prox  95      15                                         +----------+--------+--------+--------+------------------+--------+ CCA Distal80      13                                         +----------+--------+--------+--------+------------------+--------+ ICA Prox  43      13      1-39%   heterogenous               +----------+--------+--------+--------+------------------+--------+ ICA Distal83      27                                         +----------+--------+--------+--------+------------------+--------+ ECA       88      17                                         +----------+--------+--------+--------+------------------+--------+ +----------+--------+-------+----------------+-------------------+           PSV cm/sEDV cmsDescribe        Arm Pressure (mmHG) +----------+--------+-------+----------------+-------------------+ [id number]            Multiphasic, WNL                    +----------+--------+-------+----------------+-------------------+ +---------+--------+--+--------+--+---------+ VertebralPSV cm/s46EDV cm/s12Antegrade +---------+--------+--+--------+--+---------+  Left Carotid Findings: +----------+--------+--------+--------+------------------+--------+           PSV cm/sEDV cm/sStenosisPlaque DescriptionComments  +----------+--------+--------+--------+------------------+--------+ CCA Prox  122     19                                         +----------+--------+--------+--------+------------------+--------+ CCA Distal102     22                                         +----------+--------+--------+--------+------------------+--------+  ICA Prox  70      18      1-39%   heterogenous               +----------+--------+--------+--------+------------------+--------+ ICA Distal100     34                                         +----------+--------+--------+--------+------------------+--------+ +----------+--------+--------+----------------+-------------------+           PSV cm/sEDV cm/sDescribe        Arm Pressure (mmHG) +----------+--------+--------+----------------+-------------------+ [id number]             Multiphasic, WNL                    +----------+--------+--------+----------------+-------------------+ +---------+--------+--+--------+--+---------+ VertebralPSV cm/s58EDV cm/s19Antegrade +---------+--------+--+--------+--+---------+   Summary: Right Carotid: Velocities in the right ICA are consistent with a 1-39% stenosis. Left Carotid: Velocities in the left ICA are consistent with a 1-39% stenosis. Vertebrals:  Bilateral vertebral arteries demonstrate antegrade flow. Subclavians: Normal flow hemodynamics were seen in bilateral subclavian              arteries. *See table(s) above for measurements and observations.  Electronically signed by Coral Else MD on 01/14/2020 at 5:39:43 PM.    Final     Microbiology: Recent Results (from the past 240 hour(s))  SARS Coronavirus 2 by RT PCR (hospital order, performed in [facility] hospital lab) Nasopharyngeal Nasopharyngeal Swab     Status: None   Collection Time: 01/13/20  5:03 PM   Specimen: Nasopharyngeal Swab  Result Value Ref Range Status   SARS Coronavirus 2 NEGATIVE NEGATIVE Final    Comment: (NOTE) SARS-CoV-2  target nucleic acids are NOT DETECTED.  The SARS-CoV-2 RNA is generally detectable in upper and lower respiratory specimens during the acute phase of infection. The lowest concentration of SARS-CoV-2 viral copies this assay can detect is 250 copies / mL. A negative result does not preclude SARS-CoV-2 infection and should not be used as the sole basis for treatment or other patient management decisions.  A negative result may occur with improper specimen collection / handling, submission of specimen other than nasopharyngeal swab, presence of viral mutation(s) within the areas targeted by this assay, and inadequate number of viral copies (<250 copies / mL). A negative result must be combined with clinical observations, patient history, and epidemiological information.  Fact Sheet for Patients:   BoilerBrush.com.cy  Fact Sheet for Healthcare Providers: https://pope.com/  This test is not yet approved or  cleared by the [place] FDA and has been authorized for detection and/or diagnosis of SARS-CoV-2 by FDA under an Emergency Use Authorization (EUA).  This EUA will remain in effect (meaning this test can be used) for the duration of the COVID-19 declaration under Section 564(b)(1) of the Act, 21 U.S.C. section 360bbb-3(b)(1), unless the authorization is terminated or revoked sooner.  Performed at [facility], [address], [place], [place] [address]   MRSA PCR Screening     Status: None   Collection Time: 01/14/20  7:56 PM   Specimen: Nasopharyngeal  Result Value Ref Range Status   MRSA by PCR NEGATIVE NEGATIVE Final    Comment:        The GeneXpert MRSA Assay (FDA approved for NASAL specimens only), is one component of a comprehensive MRSA colonization surveillance program. It is not intended to diagnose MRSA infection nor to guide or monitor  treatment for MRSA infections. Performed at [facility], [address], [place], [place] [address]      Labs: Basic Metabolic Panel: Recent Labs  Lab 01/14/20 0458 01/15/20 0210 01/16/20 0259 01/17/20 0333 01/18/20 0456  NA 135 133* 132* 137 135  K 3.7 3.1* 3.8 3.7 3.3*  CL 94* 96* 98 101 94*  CO2 GLUCOSE 209* 207* 171* 225* 162*  BUN 46* 31* CREATININE 1.73* 1.43* 1.18 1.26* 0.95  CALCIUM 8.4* 8.0* 8.1* 8.0* 8.3*  MG 1.8 1.9  --   --   --   PHOS 3.2  --   --   --   --    Liver Function Tests: Recent Labs  Lab 01/13/20 1658 01/14/20 0458 01/16/20 0259 01/17/20 0333 01/18/20 0456  AST 52* 79* 147* 171* 98*  ALT 38 54* 113* 148* 119*  ALKPHOS 82 69 73 79 75  BILITOT 3.5* 1.6* 1.1 0.6 1.0  PROT 6.6 5.7* 5.4* 5.1* 5.4*  ALBUMIN 3.9 3.4* 3.4* 3.1* 3.2*   Recent Labs  Lab 01/13/20 1658  LIPASE 28   No results for input(s): AMMONIA in the last 168 hours. CBC: Recent Labs  Lab 01/14/20 0518 01/14/20 1100 01/14/20 1700 01/15/20 0210 01/16/20 0259 01/17/20 0333 01/18/20 0456  WBC 12.8*   < > 15.3* 10.4 6.9 4.6 4.5  NEUTROABS 8.0*  --   --  6.1 4.1 2.3 2.5  HGB 8.7*   < > 10.3* 9.7* 9.3* 8.7* 8.9*  HCT 25.6*   < > 29.5* 28.2* 27.8* 26.7* 27.0*  MCV 89.2   < > 87.0 87.3 89.4 91.8 90.0  PLT 108*   < > 140* 94* 106* 93* 91*   < > = values in this interval not displayed.   Cardiac Enzymes: No results for input(s): CKTOTAL, CKMB, CKMBINDEX, TROPONINI in the last 168 hours. BNP: BNP (last 3 results) Recent Labs    12/15/19 0427 12/16/19 0339 12/17/19 0357  BNP 37.6 102.3* 157.7*    ProBNP (last 3 results) No results for input(s): PROBNP in the last 8760 hours.  CBG: Recent Labs  Lab 01/17/20 1635 01/17/20 1950 01/17/20 2354 01/18/20 0402 01/18/20 0739  GLUCAP 254* 225* 199* 170* 157*       Signed:  Rhetta Mura MD   [facility] 01/18/2020, 11:06 AM

## 2020-01-18 NOTE — Progress Notes (Signed)
Patient had lunch and did well with no nausea. Patient will be discharging home with belongings at 4:00pm. Medication education was provided. This nurse will be calling for transportation.

## 2020-01-23 ENCOUNTER — Telehealth: Payer: Self-pay | Admitting: *Deleted

## 2020-01-23 NOTE — Telephone Encounter (Signed)
Patient enrolled for [employer] to ship a 14 day ZIO AT long term monitor-Live Telemetry to his home.  Instructions reviewed briefly but will also be included in his monitor kit.

## 2020-01-28 ENCOUNTER — Ambulatory Visit (INDEPENDENT_AMBULATORY_CARE_PROVIDER_SITE_OTHER): Payer: Medicaid Other

## 2020-01-28 DIAGNOSIS — R55 Syncope and collapse: Secondary | ICD-10-CM
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
## 2020-02-21 ENCOUNTER — Other Ambulatory Visit: Payer: Self-pay

## 2020-02-21 ENCOUNTER — Ambulatory Visit (INDEPENDENT_AMBULATORY_CARE_PROVIDER_SITE_OTHER): Payer: Medicaid Other | Admitting: Physician Assistant

## 2020-02-21 ENCOUNTER — Encounter: Payer: Self-pay | Admitting: Physician Assistant

## 2020-02-21 VITALS — BP 137/96 | HR 110 | Ht 66.0 in | Wt 213.8 lb

## 2020-02-21 DIAGNOSIS — R55 Syncope and collapse: Secondary | ICD-10-CM | POA: Diagnosis not present

## 2020-02-21 DIAGNOSIS — E876 Hypokalemia: Secondary | ICD-10-CM

## 2020-02-21 DIAGNOSIS — I10 Essential (primary) hypertension: Secondary | ICD-10-CM

## 2020-02-21 DIAGNOSIS — I350 Nonrheumatic aortic (valve) stenosis: Secondary | ICD-10-CM

## 2020-02-21 MED ORDER — METOPROLOL TARTRATE 25 MG PO TABS: 25.0000 mg | ORAL_TABLET | Freq: Two times a day (BID) | ORAL | 3 refills | Status: DC

## 2020-02-21 NOTE — Patient Instructions (Signed)
Medication Instructions:   start taking Metoprolol tartrate 25 mg.  One tablet twice a day   *If you need a refill on your cardiac medications before your next appointment, please call your pharmacy*   Lab Work: BMP today   If you have labs (blood work) drawn today and your tests are completely normal, you will receive your results only by: Marland Kitchen MyChart Message (if you have MyChart) OR . A paper copy in the mail If you have any lab test that is abnormal or we need to change your treatment, we will call you to review the results.   Testing/Procedures: Not needed   Follow-Up: At [facility], you and your health needs are our priority.  As part of our continuing mission to provide you with exceptional heart care, we have created designated Provider Care Teams.  These Care Teams include your primary Cardiologist (physician) and Advanced Practice Providers (APPs -  Physician Assistants and Nurse Practitioners) who all work together to provide you with the care you need, when you need it.  We recommend signing up for the patient portal called "MyChart".  Sign up information is provided on this After Visit Summary.  MyChart is used to connect with patients for Virtual Visits (Telemedicine).  Patients are able to view lab/test results, encounter notes, upcoming appointments, etc.  Non-urgent messages can be sent to your provider as well.   To learn more about what you can do with MyChart, go to [email or web address].    Your next appointment:   6 month(s)  The format for your next appointment:   In Person  Provider:   Epifanio Lesches, MD   Other Instructions Blood pressure monitor given to patient

## 2020-02-22 LAB — BASIC METABOLIC PANEL
BUN/Creatinine Ratio: 12 (ref 9–20)
BUN: 12 mg/dL (ref 6–24)
CO2: 24 mmol/L (ref 20–29)
Calcium: 9.2 mg/dL (ref 8.7–10.2)
Chloride: 105 mmol/L (ref 96–106)
Creatinine, Ser: 0.99 mg/dL (ref 0.76–1.27)
GFR calc Af Amer: 97 mL/min/{1.73_m2} (ref >59)
GFR calc non Af Amer: 84 mL/min/{1.73_m2} (ref >59)
Glucose: 147 mg/dL — ABNORMAL HIGH (ref 65–99)
Potassium: 3.8 mmol/L (ref 3.5–5.2)
Sodium: 142 mmol/L (ref 134–144)

## 2020-03-02 ENCOUNTER — Other Ambulatory Visit: Payer: Self-pay | Admitting: *Deleted

## 2020-03-02 DIAGNOSIS — R55 Syncope and collapse: Secondary | ICD-10-CM

## 2020-03-02 DIAGNOSIS — I4729 Other ventricular tachycardia: Secondary | ICD-10-CM

## 2020-03-02 DIAGNOSIS — I472 Ventricular tachycardia: Secondary | ICD-10-CM

## 2020-03-03 ENCOUNTER — Encounter: Payer: Self-pay | Admitting: *Deleted

## 2020-03-12 ENCOUNTER — Telehealth: Payer: Self-pay | Admitting: Cardiology

## 2020-03-12 ENCOUNTER — Encounter: Payer: Self-pay | Admitting: Cardiology

## 2020-03-12 NOTE — Telephone Encounter (Signed)
Left message for patient regarding appointment for Cardiac MRI scheduled Monday 03/30/20 at 8:00 am at [facility]---arrival time is 7:30 am 1st floor admissions office---will mail information to patient and asked him to call with questions or concerns.

## 2020-03-20 ENCOUNTER — Ambulatory Visit: Payer: Medicaid Other | Admitting: Adult Health

## 2020-03-26 ENCOUNTER — Telehealth (HOSPITAL_COMMUNITY): Payer: Self-pay | Admitting: Emergency Medicine

## 2020-03-26 NOTE — Telephone Encounter (Signed)
Reaching out to patient to offer assistance regarding upcoming cardiac imaging study; pt verbalizes understanding of appt date/time, parking situation and where to check in, and verified current allergies; name and call back number provided for further questions should they arise Mertis Mosher RN Navigator Cardiac Imaging [facility] [phone] office [phone] cell  Pt denies implants, denies claustro 

## 2020-03-30 ENCOUNTER — Ambulatory Visit (HOSPITAL_COMMUNITY)
Admission: RE | Admit: 2020-03-30 | Discharge: 2020-03-30 | Disposition: A | Discharge status: Home / Self Care | Payer: Medicaid Other | Source: Ambulatory Visit | Attending: Cardiology | Admitting: Cardiology

## 2020-03-30 ENCOUNTER — Other Ambulatory Visit: Payer: Self-pay

## 2020-03-30 ENCOUNTER — Encounter (HOSPITAL_COMMUNITY): Payer: Self-pay

## 2020-03-30 DIAGNOSIS — R55 Syncope and collapse: Secondary | ICD-10-CM

## 2020-03-30 DIAGNOSIS — I472 Ventricular tachycardia: Secondary | ICD-10-CM | POA: Diagnosis not present

## 2020-03-30 DIAGNOSIS — I4729 Other ventricular tachycardia: Secondary | ICD-10-CM

## 2020-03-30 MED ORDER — GADOBUTROL 1 MMOL/ML IV SOLN
10.0000 mL | Freq: Once | INTRAVENOUS | Status: AC | PRN
  Administered 2020-03-30: 10 mL via INTRAVENOUS

## 2020-04-03 ENCOUNTER — Other Ambulatory Visit: Payer: Self-pay | Admitting: *Deleted

## 2020-04-03 DIAGNOSIS — I350 Nonrheumatic aortic (valve) stenosis: Secondary | ICD-10-CM

## 2020-04-07 ENCOUNTER — Other Ambulatory Visit: Payer: Self-pay

## 2020-04-07 ENCOUNTER — Ambulatory Visit (HOSPITAL_COMMUNITY): Payer: Medicaid Other | Attending: Cardiovascular Disease

## 2020-04-07 DIAGNOSIS — I350 Nonrheumatic aortic (valve) stenosis: Secondary | ICD-10-CM | POA: Diagnosis not present

## 2020-04-07 LAB — ECHOCARDIOGRAM COMPLETE
AR max vel: 1.5 cm2
AV Area VTI: 1.59 cm2
AV Area mean vel: 1.4 cm2
AV Mean grad: 17.3 mmHg
AV Peak grad: 29.9 mmHg
Ao pk vel: 2.73 m/s
Area-P 1/2: 3.99 cm2
S' Lateral: 2.3 cm

## 2020-04-09 ENCOUNTER — Telehealth: Payer: Self-pay | Admitting: Cardiology

## 2020-04-09 ENCOUNTER — Other Ambulatory Visit (HOSPITAL_COMMUNITY): Payer: Medicaid Other

## 2020-04-09 NOTE — Telephone Encounter (Signed)
spoke with the patient extensively, went through both echo and cardiac MRI, he decided to come in to the office and go into detail of the report with Dr. Bjorn Pippin

## 2020-04-09 NOTE — Telephone Encounter (Signed)
New Message:    Pt wants to know if he have to come in for his appointment tomorrow or can he find out from a telephone call?

## 2020-04-10 ENCOUNTER — Ambulatory Visit (INDEPENDENT_AMBULATORY_CARE_PROVIDER_SITE_OTHER): Payer: Medicaid Other | Admitting: Cardiology

## 2020-04-10 ENCOUNTER — Other Ambulatory Visit: Payer: Self-pay

## 2020-04-10 VITALS — BP 162/94 | HR 98 | Ht 66.0 in | Wt 213.0 lb

## 2020-04-10 DIAGNOSIS — I422 Other hypertrophic cardiomyopathy: Secondary | ICD-10-CM

## 2020-04-10 DIAGNOSIS — I4729 Other ventricular tachycardia: Secondary | ICD-10-CM

## 2020-04-10 DIAGNOSIS — I10 Essential (primary) hypertension: Secondary | ICD-10-CM

## 2020-04-10 DIAGNOSIS — I472 Ventricular tachycardia: Secondary | ICD-10-CM

## 2020-04-10 DIAGNOSIS — Q231 Congenital insufficiency of aortic valve: Secondary | ICD-10-CM

## 2020-04-10 DIAGNOSIS — R55 Syncope and collapse: Secondary | ICD-10-CM | POA: Diagnosis not present

## 2020-04-10 MED ORDER — CARVEDILOL 6.25 MG PO TABS: 6.2500 mg | ORAL_TABLET | Freq: Two times a day (BID) | ORAL | 3 refills | Status: DC

## 2020-04-10 NOTE — Patient Instructions (Signed)
Medication Instructions:  STOP metoprolol START carvedilol (Coreg) 6.25 mg two times daily  Please check your blood pressure at home daily, write it down.  Call the office or send message via Mychart with the readings in 2 weeks for Dr. Bjorn Pippin to review.   Lab Work: Nutritional therapist, Mag today  If you have labs (blood work) drawn today and your tests are completely normal, you will receive your results only by: Marland Kitchen MyChart Message (if you have MyChart) OR . A paper copy in the mail If you have any lab test that is abnormal or we need to change your treatment, we will call you to review the results.  Follow-Up: At [facility], you and your health needs are our priority.  As part of our continuing mission to provide you with exceptional heart care, we have created designated Provider Care Teams.  These Care Teams include your primary Cardiologist (physician) and Advanced Practice Providers (APPs -  Physician Assistants and Nurse Practitioners) who all work together to provide you with the care you need, when you need it.  We recommend signing up for the patient portal called "MyChart".  Sign up information is provided on this After Visit Summary.  MyChart is used to connect with patients for Virtual Visits (Telemedicine).  Patients are able to view lab/test results, encounter notes, upcoming appointments, etc.  Non-urgent messages can be sent to your provider as well.   To learn more about what you can do with MyChart, go to [email or web address].    Your next appointment:   3 month(s)  The format for your next appointment:   In Person  Provider:   Epifanio Lesches, MD   Other Instructions You have been referred to Cardiac Electrophysiologist --a scheduler will call to arrange this appointment
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
## 2020-04-11 LAB — BASIC METABOLIC PANEL
BUN/Creatinine Ratio: 12 (ref 9–20)
BUN: 11 mg/dL (ref 6–24)
CO2: 22 mmol/L (ref 20–29)
Calcium: 9.5 mg/dL (ref 8.7–10.2)
Chloride: 102 mmol/L (ref 96–106)
Creatinine, Ser: 0.95 mg/dL (ref 0.76–1.27)
GFR calc Af Amer: 102 mL/min/{1.73_m2} (ref >59)
GFR calc non Af Amer: 88 mL/min/{1.73_m2} (ref >59)
Glucose: 127 mg/dL — ABNORMAL HIGH (ref 65–99)
Potassium: 4.2 mmol/L (ref 3.5–5.2)
Sodium: 142 mmol/L (ref 134–144)

## 2020-04-11 LAB — MAGNESIUM: Magnesium: 2 mg/dL (ref 1.6–2.3)

## 2020-04-13 ENCOUNTER — Encounter: Payer: Self-pay | Admitting: *Deleted

## 2020-04-22 ENCOUNTER — Ambulatory Visit (INDEPENDENT_AMBULATORY_CARE_PROVIDER_SITE_OTHER): Payer: Medicaid Other | Admitting: Internal Medicine

## 2020-04-22 ENCOUNTER — Other Ambulatory Visit: Payer: Self-pay

## 2020-04-22 ENCOUNTER — Encounter: Payer: Self-pay | Admitting: Internal Medicine

## 2020-04-22 ENCOUNTER — Telehealth: Payer: Self-pay | Admitting: Cardiology

## 2020-04-22 VITALS — BP 144/96 | HR 98 | Ht 66.0 in | Wt 211.6 lb

## 2020-04-22 DIAGNOSIS — I422 Other hypertrophic cardiomyopathy: Secondary | ICD-10-CM

## 2020-04-22 DIAGNOSIS — I472 Ventricular tachycardia: Secondary | ICD-10-CM | POA: Diagnosis not present

## 2020-04-22 DIAGNOSIS — I4729 Other ventricular tachycardia: Secondary | ICD-10-CM

## 2020-04-22 DIAGNOSIS — R55 Syncope and collapse: Secondary | ICD-10-CM | POA: Diagnosis not present

## 2020-04-22 HISTORY — DX: Other ventricular tachycardia: I47.29

## 2020-04-22 LAB — CBC WITH DIFFERENTIAL/PLATELET
Basophils Absolute: 0.1 10*3/uL (ref 0.0–0.2)
Basos: 1 %
EOS (ABSOLUTE): 0.1 10*3/uL (ref 0.0–0.4)
Eos: 3 %
Hematocrit: 38.3 % (ref 37.5–51.0)
Hemoglobin: 12.2 g/dL — ABNORMAL LOW (ref 13.0–17.7)
Lymphocytes Absolute: 1.2 10*3/uL (ref 0.7–3.1)
Lymphs: 24 %
MCH: 23.2 pg — ABNORMAL LOW (ref 26.6–33.0)
MCHC: 31.9 g/dL (ref 31.5–35.7)
MCV: 73 fL — ABNORMAL LOW (ref 79–97)
Monocytes Absolute: 0.6 10*3/uL (ref 0.1–0.9)
Monocytes: 13 %
Neutrophils Absolute: 3 10*3/uL (ref 1.4–7.0)
Neutrophils: 59 %
Platelets: 99 10*3/uL — CL (ref 150–450)
RBC: 5.25 x10E6/uL (ref 4.14–5.80)
RDW: 22.7 % — ABNORMAL HIGH (ref 11.6–15.4)
WBC: 5 10*3/uL (ref 3.4–10.8)

## 2020-04-22 LAB — BASIC METABOLIC PANEL
BUN/Creatinine Ratio: 11 (ref 9–20)
BUN: 11 mg/dL (ref 6–24)
CO2: 24 mmol/L (ref 20–29)
Calcium: 9.1 mg/dL (ref 8.7–10.2)
Chloride: 102 mmol/L (ref 96–106)
Creatinine, Ser: 0.98 mg/dL (ref 0.76–1.27)
GFR calc Af Amer: 99 mL/min/{1.73_m2} (ref >59)
GFR calc non Af Amer: 85 mL/min/{1.73_m2} (ref >59)
Glucose: 251 mg/dL — ABNORMAL HIGH (ref 65–99)
Potassium: 4 mmol/L (ref 3.5–5.2)
Sodium: 136 mmol/L (ref 134–144)
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
## 2020-04-22 NOTE — Patient Instructions (Addendum)
Medication Instructions:  Your physician recommends that you continue on your current medications as directed. Please refer to the Current Medication list given to you today.  Labwork: You will get lab work today:  CBC and BMP  Testing/Procedures: None ordered.  Follow-Up:  SEE INSTRUCTION LETTER  Any Other Special Instructions Will Be Listed Below (If Applicable).  If you need a refill on your cardiac medications before your next appointment, please call your pharmacy.    Cardioverter Defibrillator Implantation  An implantable cardioverter defibrillator (ICD) is a small device that is placed under the skin in the chest or abdomen. An ICD consists of a battery, a small computer (pulse generator), and wires (leads) that go into the heart. An ICD is used to detect and correct two types of dangerous irregular heartbeats (arrhythmias):  A rapid heart rhythm (tachycardia).  An arrhythmia in which the lower chambers of the heart (ventricles) contract in an uncoordinated way (fibrillation). When an ICD detects tachycardia, it sends a low-energy shock to the heart to restore the heartbeat to normal (cardioversion). This signal is usually painless. If cardioversion does not work or if the ICD detects fibrillation, it delivers a high-energy shock to the heart (defibrillation) to restart the heart. This shock may feel like a strong jolt in the chest. Your health care provider may prescribe an ICD if:  You have had an arrhythmia that originated in the ventricles.  Your heart has been damaged by a disease or heart condition. Sometimes, ICDs are programmed to act as a device called a pacemaker. Pacemakers can be used to treat a slow heartbeat (bradycardia) or tachycardia by taking over the heart rate with electrical impulses. Tell a health care provider about:  Any allergies you have.  All medicines you are taking, including vitamins, herbs, eye drops, creams, and over-the-counter  medicines.  Any problems you or family members have had with anesthetic medicines.  Any blood disorders you have.  Any surgeries you have had.  Any medical conditions you have.  Whether you are pregnant or may be pregnant. What are the risks? Generally, this is a safe procedure. However, problems may occur, including:  Swelling, bleeding, or bruising.  Infection.  Blood clots.  Damage to other structures or organs, such as nerves, blood vessels, or the heart.  Allergic reactions to medicines used during the procedure. What happens before the procedure? Staying hydrated Follow instructions from your health care provider about hydration, which may include:  Up to 2 hours before the procedure - you may continue to drink clear liquids, such as water, clear fruit juice, black coffee, and plain tea. Eating and drinking restrictions Follow instructions from your health care provider about eating and drinking, which may include:  8 hours before the procedure - stop eating heavy meals or foods such as meat, fried foods, or fatty foods.  6 hours before the procedure - stop eating light meals or foods, such as toast or cereal.  6 hours before the procedure - stop drinking milk or drinks that contain milk.  2 hours before the procedure - stop drinking clear liquids. Medicine Ask your health care provider about:  Changing or stopping your normal medicines. This is important if you take diabetes medicines or blood thinners.  Taking medicines such as aspirin and ibuprofen. These medicines can thin your blood. Do not take these medicines before your procedure if your doctor tells you not to. Tests  You may have blood tests.  You may have a test to check  the electrical signals in your heart (electrocardiogram, ECG).  You may have imaging tests, such as a chest X-ray. General instructions  For 24 hours before the procedure, stop using products that contain nicotine or tobacco, such  as cigarettes and e-cigarettes. If you need help quitting, ask your health care provider.  Plan to have someone take you home from the hospital or clinic.  You may be asked to shower with a germ-killing soap. What happens during the procedure?  To reduce your risk of infection: ? Your health care team will wash or sanitize their hands. ? Your skin will be washed with soap. ? Hair may be removed from the surgical area.  Small monitors will be put on your body. They will be used to check your heart, blood pressure, and oxygen level.  An IV tube will be inserted into one of your veins.  You will be given one or more of the following: ? A medicine to help you relax (sedative). ? A medicine to numb the area (local anesthetic). ? A medicine to make you fall asleep (general anesthetic).  Leads will be guided through a blood vessel into your heart and attached to your heart muscles. Depending on the ICD, the leads may go into one ventricle or they may go into both ventricles and into an upper chamber of the heart. An X-ray machine (fluoroscope) will be usedto help guide the leads.  A small incision will be made to create a deep pocket under your skin.  The pulse generator will be placed into the pocket.  The ICD will be tested.  The incision will be closed with stitches (sutures), skin glue, or staples.  A bandage (dressing) will be placed over the incision. This procedure may vary among health care providers and hospitals. What happens after the procedure?  Your blood pressure, heart rate, breathing rate, and blood oxygen level will be monitored often until the medicines you were given have worn off.  A chest X-ray will be taken to check that the ICD is in the right place.  You will need to stay in the hospital for 1-2 days so your health care provider can make sure your ICD is working.  Do not drive for 24 hours if you received a sedative. Ask your health care provider when it is  safe for you to drive.  You may be given an identification card explaining that you have an ICD. Summary  An implantable cardioverter defibrillator (ICD) is a small device that is placed under the skin in the chest or abdomen. It is used to detect and correct dangerous irregular heartbeats (arrhythmias).  An ICD consists of a battery, a small computer (pulse generator), and wires (leads) that go into the heart.  When an ICD detects rapid heart rhythm (tachycardia), it sends a low-energy shock to the heart to restore the heartbeat to normal (cardioversion). If cardioversion does not work or if the ICD detects uncoordinated heart contractions (fibrillation), it delivers a high-energy shock to the heart (defibrillation) to restart the heart.  You will need to stay in the hospital for 1-2 days to make sure your ICD is working. This information is not intended to replace advice given to you by your health care provider. Make sure you discuss any questions you have with your health care provider. Document Revised: 04/21/2017 Document Reviewed: 05/18/2016 Elsevier Patient Education  2020 [employer].

## 2020-04-22 NOTE — Telephone Encounter (Signed)
Returned call to patient who states that since he last seen Dr. Bjorn Pippin 11/19 he had some blood pressure machine issues. Patient states that he started getting error messages on machine and was unable to get readings. Patient saw Dr. Ladona Ridgel today where he got another blood pressure machine. Patient wanted to let Dr. Bjorn Pippin know that he will start checking his blood pressure daily with his new blood pressure machine and will bring those readings in to the office in 2 weeks. Advised patient that this should be fine, and to let us know if there are any questions, issues or concerns in the mean time. Patient also wanted to let Dr. Bjorn Pippin know that he is scheduled to get an ICD placed by Dr. Ladona Ridgel 05/18/20. Advised patient I would forward message. Patient verbalized understanding.
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
## 2020-04-22 NOTE — Telephone Encounter (Signed)
Diogenes is calling stating he just received a new BP machine from Dr. Johney Frame due to his original being broken. He states Dr. Bjorn Pippin requested he document his BP readings for two weeks, but has been unable to do so due to this. He is wanting to know if Dr. Bjorn Pippin would be okay with him being to document his BP reading on Monday. Please advise.

## 2020-04-30 ENCOUNTER — Telehealth: Payer: Self-pay | Admitting: Internal Medicine

## 2020-04-30 NOTE — Telephone Encounter (Signed)
Called patient back. Patient stated that he is really concerned about driving to COVID testing place. Patient stated he has been told not to drive by some of doctors. Informed patient that as far as I know our testing site is the only place they want patient's to be tested. Informed patient that a message would be sent to Dr. Lubertha Basque nurse to see if she knew of any work around, but at this time he would need to go to the COVID site for [employer]. Patient did not mention night sweats. Patient stated he would try to get someone to drive him for his covid test when the date comes.

## 2020-04-30 NOTE — Telephone Encounter (Signed)
   Pt is calling, he would like to know if he can get his covid test to his pcp and will send the result to [place]. He said the covid test center is a little far away from him and he cant drive that far

## 2020-04-30 NOTE — Telephone Encounter (Signed)
    Pt is calling back to follow up, pt said the reason he called back because he was having nigh sweats for couple of days and would like to speak with a nurse about it

## 2020-05-01 ENCOUNTER — Telehealth: Payer: Self-pay | Admitting: Cardiology

## 2020-05-01 NOTE — Telephone Encounter (Signed)
Spoke with patient, he reports he has been short of breath and lightheaded, denies any syncopal episodes.  Also having night sweats over last 4 nights.  He denies any fevers or chills.  Given his worsening dyspnea, discussed going to the ED for evaluation.  He declined, states that he does not want to go to the ED today but will consider tomorrow if his symptoms are not improving.  Advised that any worsening dyspnea and lightheadedness or any syncopal episodes, should present to the ED.  If symptoms are improving, okay to see PCP early next week for work-up of his night sweats.

## 2020-05-01 NOTE — Telephone Encounter (Signed)
STAT if patient feels like he/she is going to faint   1) Are you dizzy now? Yes   2) Do you feel faint or have you passed out? Feels faint now   3) Do you have any other symptoms? Palpitations & night sweats  4) Have you checked your HR and BP (record if available)? States it is running high   Connor Vaughn states this has been occurring for the past 4 days.

## 2020-05-01 NOTE — Telephone Encounter (Addendum)
Pt calling in today to report night sweats for 4 days. He states he has soaked his sheets each night, which is uncommon for him. He is also experiencing severe dizziness, though he denies loss of consciousness during this time period. Reports history of syncope, however. Due to have PPM implanted 12/27.   BP 173/110. Heart rate 127. Taken all morning medications at 0800. Has felt "very weird" the last 4 days. Estimates he has lost about 10 lbs in the last 4-5 days.   The pt is very tearful on the phone today. Reports increase in anxiety related to being quarantined during Christmas ahead of his procedure and missing time with his family. States many family members have passed away suddenly, and this increases his anxiety substantially. Several minutes spent on phone with pt providing emotional support. At the end of our call, pt states he feels much better and even his lightheadedness is improved.  Advised the patient that I would reach out to Dr. Bjorn Pippin with this information and contact him with any recommendations. Advised pt to seek urgent medical evaluation for loss of consciousness, chest pain, shortness of breath, or worsening symptoms. The patient verbalizes understanding and agreement with plan.   Will route to Dr. Bjorn Pippin and Dr. Ladona Ridgel for review.

## 2020-05-13 ENCOUNTER — Telehealth: Payer: Self-pay | Admitting: Cardiology

## 2020-05-13 NOTE — Telephone Encounter (Signed)
Called to speak with the patient, who states that over the last couple of days he has been feeling short of breath with activity and a sense of lightheadedness. He states he has not lost consciousness. He states he has been going through a lot of stress and anxiety recently, and believes that may be driving his symptoms. Per Dr. Campbell Lerner phone note from 05/01/20, advised pt to present to the ER for worsening symptoms and to contact his PCP for follow up. Pt states he is due to see his PCP again early in January, but will consider going to ER if he gets any worse. Provided emotional support to patient and encouraged him to reach out to [place] with any further questions or concerns. The patient verbalizes understanding and agreement with plan.

## 2020-05-13 NOTE — Telephone Encounter (Signed)
   Pt c/o BP issue: STAT if pt c/o blurred vision, one-sided weakness or slurred speech  1. What are your last 5 BP readings?  145/85 85 138/84 86 135/79 89 125/78  126/15 115 173/97 109 - today  173/107 HR 109 - while on the phone   2. Are you having any other symptoms (ex. Dizziness, headache, blurred vision, passed out)? Lightheaded, SOB with activity   3. What is your BP issue? Pt calling to give BP readings, he said he tried managing his BP but lately it went up again, he said he gets lightheadedness and he gets up and gets SOB when he walks, he tried to move around

## 2020-05-14 ENCOUNTER — Other Ambulatory Visit (HOSPITAL_COMMUNITY)
Admission: RE | Admit: 2020-05-14 | Discharge: 2020-05-14 | Disposition: A | Discharge status: Home / Self Care | Payer: Medicaid Other | Source: Ambulatory Visit | Attending: Internal Medicine | Admitting: Internal Medicine

## 2020-05-14 DIAGNOSIS — Z20822 Contact with and (suspected) exposure to covid-19: Secondary | ICD-10-CM | POA: Diagnosis not present

## 2020-05-14 DIAGNOSIS — Z01812 Encounter for preprocedural laboratory examination: Secondary | ICD-10-CM | POA: Insufficient documentation

## 2020-05-14 LAB — SARS CORONAVIRUS 2 (TAT 6-24 HRS): SARS Coronavirus 2: NEGATIVE

## 2020-05-14 NOTE — Progress Notes (Signed)
Instructed patient on the following items: Arrival time 0530 Nothing to eat or drink after midnight No meds AM of procedure Responsible person to drive you home and stay with you for 24 hrs Wash with special soap night before and morning of procedure  

## 2020-05-18 ENCOUNTER — Ambulatory Visit (HOSPITAL_COMMUNITY)
Admission: RE | Disposition: A | Discharge status: Home / Self Care | Payer: Self-pay | Source: Home / Self Care | Attending: Internal Medicine

## 2020-05-18 ENCOUNTER — Other Ambulatory Visit: Payer: Self-pay

## 2020-05-18 ENCOUNTER — Ambulatory Visit (HOSPITAL_COMMUNITY)
Admission: RE | Admit: 2020-05-18 | Discharge: 2020-05-18 | Disposition: A | Discharge status: Home / Self Care | Payer: Medicaid Other | Attending: Internal Medicine | Admitting: Internal Medicine

## 2020-05-18 ENCOUNTER — Ambulatory Visit (HOSPITAL_COMMUNITY): Payer: Medicaid Other

## 2020-05-18 DIAGNOSIS — I472 Ventricular tachycardia: Secondary | ICD-10-CM | POA: Diagnosis not present

## 2020-05-18 DIAGNOSIS — Z7984 Long term (current) use of oral hypoglycemic drugs: Secondary | ICD-10-CM | POA: Diagnosis not present

## 2020-05-18 DIAGNOSIS — I421 Obstructive hypertrophic cardiomyopathy: Secondary | ICD-10-CM

## 2020-05-18 DIAGNOSIS — Z79899 Other long term (current) drug therapy: Secondary | ICD-10-CM | POA: Insufficient documentation

## 2020-05-18 DIAGNOSIS — Z8619 Personal history of other infectious and parasitic diseases: Secondary | ICD-10-CM | POA: Insufficient documentation

## 2020-05-18 DIAGNOSIS — Z87891 Personal history of nicotine dependence: Secondary | ICD-10-CM | POA: Diagnosis not present

## 2020-05-18 DIAGNOSIS — Z8249 Family history of ischemic heart disease and other diseases of the circulatory system: Secondary | ICD-10-CM | POA: Diagnosis not present

## 2020-05-18 DIAGNOSIS — R55 Syncope and collapse: Secondary | ICD-10-CM | POA: Diagnosis not present

## 2020-05-18 DIAGNOSIS — Z006 Encounter for examination for normal comparison and control in clinical research program: Secondary | ICD-10-CM | POA: Diagnosis not present

## 2020-05-18 DIAGNOSIS — I422 Other hypertrophic cardiomyopathy: Secondary | ICD-10-CM | POA: Diagnosis not present

## 2020-05-18 DIAGNOSIS — Z95810 Presence of automatic (implantable) cardiac defibrillator: Secondary | ICD-10-CM

## 2020-05-18 HISTORY — PX: ICD IMPLANT: EP1208

## 2020-05-18 LAB — GLUCOSE, CAPILLARY: Glucose-Capillary: 191 mg/dL — ABNORMAL HIGH (ref 70–99)

## 2020-05-18 SURGERY — ICD IMPLANT

## 2020-05-18 MED ORDER — FENTANYL CITRATE (PF) 100 MCG/2ML IJ SOLN
INTRAMUSCULAR | Status: AC
  Filled 2020-05-18: qty 2

## 2020-05-18 MED ORDER — HEPARIN (PORCINE) IN NACL 1000-0.9 UT/500ML-% IV SOLN
INTRAVENOUS | Status: AC
  Filled 2020-05-18: qty 500

## 2020-05-18 MED ORDER — CEFAZOLIN SODIUM-DEXTROSE 2-4 GM/100ML-% IV SOLN
2.0000 g | INTRAVENOUS | Status: AC
  Administered 2020-05-18: 08:00:00 2 g via INTRAVENOUS
  Filled 2020-05-18: qty 100

## 2020-05-18 MED ORDER — LIDOCAINE HCL (PF) 1 % IJ SOLN
INTRAMUSCULAR | Status: DC | PRN
  Administered 2020-05-18: 08:00:00 60 mL

## 2020-05-18 MED ORDER — MIDAZOLAM HCL 5 MG/5ML IJ SOLN
INTRAMUSCULAR | Status: DC | PRN
  Administered 2020-05-18: 2 mg via INTRAVENOUS
  Administered 2020-05-18: 1 mg via INTRAVENOUS

## 2020-05-18 MED ORDER — LIDOCAINE HCL 1 % IJ SOLN
INTRAMUSCULAR | Status: AC
  Filled 2020-05-18: qty 60

## 2020-05-18 MED ORDER — TRAMADOL HCL 50 MG PO TABS
50.0000 mg | ORAL_TABLET | Freq: Once | ORAL | Status: AC
  Administered 2020-05-18: 14:00:00 50 mg via ORAL
  Filled 2020-05-18: qty 1

## 2020-05-18 MED ORDER — FENTANYL CITRATE (PF) 100 MCG/2ML IJ SOLN
INTRAMUSCULAR | Status: DC | PRN
  Administered 2020-05-18: 12.5 ug via INTRAVENOUS
  Administered 2020-05-18: 25 ug via INTRAVENOUS

## 2020-05-18 MED ORDER — CHLORHEXIDINE GLUCONATE 4 % EX LIQD
4.0000 "application " | Freq: Once | CUTANEOUS | Status: DC
  Filled 2020-05-18: qty 60

## 2020-05-18 MED ORDER — MIDAZOLAM HCL 5 MG/5ML IJ SOLN
INTRAMUSCULAR | Status: AC
  Filled 2020-05-18: qty 5

## 2020-05-18 MED ORDER — ACETAMINOPHEN 325 MG PO TABS
325.0000 mg | ORAL_TABLET | ORAL | Status: DC | PRN
  Administered 2020-05-18: 650 mg via ORAL
  Filled 2020-05-18: qty 2

## 2020-05-18 MED ORDER — SODIUM CHLORIDE 0.9 % IV SOLN
INTRAVENOUS | Status: AC
  Filled 2020-05-18: qty 2

## 2020-05-18 MED ORDER — SODIUM CHLORIDE 0.9 % IV SOLN
80.0000 mg | INTRAVENOUS | Status: DC
  Filled 2020-05-18: qty 2

## 2020-05-18 MED ORDER — TRAMADOL HCL 50 MG PO TABS
ORAL_TABLET | ORAL | Status: AC
  Filled 2020-05-18: qty 1

## 2020-05-18 MED ORDER — HEPARIN (PORCINE) IN NACL 1000-0.9 UT/500ML-% IV SOLN
INTRAVENOUS | Status: DC | PRN
  Administered 2020-05-18: 500 mL

## 2020-05-18 MED ORDER — CEFAZOLIN SODIUM-DEXTROSE 1-4 GM/50ML-% IV SOLN: 1.0000 g | Freq: Four times a day (QID) | INTRAVENOUS | Status: DC

## 2020-05-18 MED ORDER — ONDANSETRON HCL 4 MG/2ML IJ SOLN: 4.0000 mg | Freq: Four times a day (QID) | INTRAMUSCULAR | Status: DC | PRN

## 2020-05-18 MED ORDER — CEFAZOLIN SODIUM-DEXTROSE 2-4 GM/100ML-% IV SOLN
INTRAVENOUS | Status: AC
  Filled 2020-05-18: qty 100

## 2020-05-18 MED ORDER — HEPARIN SODIUM (PORCINE) 1000 UNIT/ML IJ SOLN
INTRAMUSCULAR | Status: AC
  Filled 2020-05-18: qty 1

## 2020-05-18 MED ORDER — SODIUM CHLORIDE 0.9 % IV SOLN: INTRAVENOUS | Status: DC

## 2020-05-18 SURGICAL SUPPLY — 8 items
CABLE SURGICAL S-101-97-12 (CABLE) ×3 IMPLANT
COVER DOME SNAP 22 D (MISCELLANEOUS) ×3 IMPLANT
ICD ACTICOR DX (ICD Generator) ×3 IMPLANT
LEAD PLEXA S DX 65/15 414005 (Lead) ×3 IMPLANT
PAD PRO RADIOLUCENT 2001M-C (PAD) ×3 IMPLANT
SHEATH 7FR PRELUDE SNAP 13 (SHEATH) ×3 IMPLANT
SHEATH 8FR PRELUDE SNAP 13 (SHEATH) ×3 IMPLANT
TRAY PACEMAKER INSERTION (PACKS) ×3 IMPLANT

## 2020-05-18 NOTE — Progress Notes (Signed)
Pt c/o pain Connor Saber, Pa called and informed

## 2020-05-18 NOTE — Discharge Instructions (Signed)
Cardioverter Defibrillator Implantation, Care After This sheet gives you information about how to care for yourself after your procedure. Your health care provider may also give you more specific instructions. If you have problems or questions, contact your health care provider. What can I expect after the procedure? After the procedure, it is common to have:  Some pain. It may last a few days.  A slight bump over the skin where the device was placed. Sometimes, it is possible to feel the device under the skin. This is normal. During the months and years after your procedure, your health care provider will check the device, the leads, and the battery every few months. Eventually, when the battery is low, the device will be replaced. Follow these instructions at home: Medicines  Take over-the-counter and prescription medicines only as told by your health care provider.  If you were prescribed an antibiotic medicine, take it as told by your health care provider. Do not stop taking the antibiotic even if you start to feel better. Incision care      Follow instructions from your health care provider about how to take care of your incision area. Make sure you: ? Wash your hands with soap and water before you change your bandage (dressing). If soap and water are not available, use hand sanitizer. ? Change your dressing as told by your health care provider. ? Leave stitches (sutures), skin glue, or adhesive strips in place. These skin closures may need to stay in place for 2 weeks or longer. If adhesive strip edges start to loosen and curl up, you may trim the loose edges. Do not remove adhesive strips completely unless your health care provider tells you to do that.  Check your incision area every day for signs of infection. Check for: ? More redness, swelling, or pain. ? More fluid or blood. ? Warmth. ? Pus or a bad smell.  Do not use lotions or ointments near the incision area unless told  by your health care provider.  Keep the incision area clean and dry for 2-3 days after the procedure or for as long as told by your health care provider. It takes several weeks for the incision site to heal completely.  Do not take baths, swim, or use a hot tub until your health care provider approves. Activity  Try to walk a little every day. Exercising is important after this procedure. Also, use your shoulder on the side of the defibrillator in daily tasks that do not require a lot of motion.  For at least 6 weeks: ? Do not lift your upper arm above your shoulders. This means no tennis, golf, or swimming for this period of time. If you tend to sleep with your arm above your head, use a restraint to prevent this during sleep. ? Avoid sudden jerking, pulling, or chopping movements that pull your upper arm far away from your body.  Ask your health care provider when you may go back to work.  Check with your health care provider before you start to drive or play sports. Electric and magnetic fields  Tell all health care providers that you have a defibrillator. This may prevent them from giving you an MRI scan because strong magnets are used for that test.  If you must pass through a metal detector, quickly walk through it. Do not stop under the detector, and do not stand near it.  Avoid places or objects that have a strong electric or magnetic field, including: ?  [place] [employer]. At the airport, let officials know that you have a defibrillator. Your defibrillator ID card will let you be checked in a way that is safe for you and will not damage your defibrillator. Also, do not let a security person wave a magnetic wand near your defibrillator. That can make it stop working. ? Power plants. ? Large electrical generators. ? Anti-theft systems or electronic article surveillance (EAS). ? Radiofrequency transmission towers, such as cell phone and radio towers.  Do not use amateur (ham)  radio equipment or electric (arc) welding torches. Some devices are safe to use if held at least 12 inches (30 cm) from your defibrillator. These include power tools, lawn mowers, and speakers. If you are unsure if something is safe to use, ask your health care provider.  Do not use MP3 player headphones. They have magnets.  You may safely use electric blankets, heating pads, computers, and microwave ovens.  When using your cell phone, hold it to the ear that is on the opposite side from the defibrillator. Do not leave your cell phone in a pocket over the defibrillator. General instructions  Follow diet instructions from your health care provider, if this applies.  Always keep your defibrillator ID card with you. The card should list the implant date, device model, and manufacturer. Consider wearing a medical alert bracelet or necklace.  Have your defibrillator checked every 3-6 months or as often as told by your health care provider. Most defibrillators last for 4-8 years.  Keep all follow-up visits as told by your health care provider. This is important for your health care provider to make sure your chest is healing the way it should. Ask your health care provider when you should come back to have your stitches or staples taken out. Contact a health care provider if:  You feel one shock in your chest.  You gain weight suddenly.  Your legs or feet swell more than they have before.  It feels like your heart is fluttering or skipping beats (heart palpitations).  You have more redness, swelling, or pain around your incision.  You have more fluid or blood coming from your incision.  Your incision feels warm to the touch.  You have pus or a bad smell coming from your incision.  You have a fever. Get help right away if:  You have chest pain.  You feel more than one shock.  You feel more short of breath than you have felt before.  You feel more light-headed than you have felt  before.  Your incision starts to open up. This information is not intended to replace advice given to you by your health care provider. Make sure you discuss any questions you have with your health care provider. Document Revised: 08/03/2017 Document Reviewed: 10/14/2015 Elsevier Patient Education  2020 [employer]   Subcutaneous Cardioverter Defibrillator Implantation, Care After This sheet gives you information about how to care for yourself after your procedure. Your health care provider may also give you more specific instructions. If you have problems or questions, contact your health care provider. What can I expect after the procedure? After the procedure, it is common to have:  Some pain. It may last a few days.  A slight bump under the skin where the subcutaneous implantation cardioverter defibrillator (S-ICD) is. You may be able to feel the device under the skin. This is normal. Follow these instructions at home: Medicines  Take over-the-counter and prescription medicines only as told by your  health care provider.  If you were prescribed an antibiotic medicine, take it as told by your health care provider. Do not stop taking the antibiotic even if you start to feel better. Incision care      Follow instructions from your health care provider about how to take care of your incision. Make sure you: ? Wash your hands with soap and water before you change your bandage (dressing). If soap and water are not available, use hand sanitizer. ? Change your dressing as told by your health care provider. ? Leave stitches (sutures), skin glue, or adhesive strips in place. These skin closures may need to stay in place for 2 weeks or longer. If adhesive strip edges start to loosen and curl up, you may trim the loose edges. Do not remove adhesive strips completely unless your health care provider tells you to do that.  Check your incision area every day for signs of infection. Check  for: ? Redness, swelling, or pain. ? Fluid or blood. ? Warmth. ? Pus or a bad smell. Activity  Do not lift anything that is heavier than 10 lb (4.5 kg), or the limit that you are told, until your health care provider says that it is safe.  Avoid sports and any other activity that could cause a hit to the generator or leads. Ask your health care provider what activities are safe for you and when you may return to your normal activities.  Follow instructions from your health care provider about exercise and sexual activity restrictions after your procedure. Electric and magnetic fields  Tell all health care providers, including your dentist, that you have a defibrillator. They need to know this so they do not give you an MRI scan, which uses strong magnets.  When using your cell phone, hold it to the ear that is on the opposite side from the defibrillator. Do not leave your cell phone in a pocket over the defibrillator.  If you must pass through a metal detector, quickly walk through it. Do not stop under the detector, and do not stand near it.  Avoid places or objects that have a strong electric or magnetic field, including: ? [place] [employer]. At the airport, tell officials that you have a defibrillator. Your defibrillator ID card will let you be checked in a way that is safe for you and will not damage your defibrillator. Also, do not let a security person wave a magnetic wand near your defibrillator. That can make it stop working. ? Power plants. ? Large electrical generators. ? Anti-theft systems or electronic article surveillance (EAS). ? Radiofrequency transmission towers, such as cell phone and radio towers.  Do not use amateur (ham) radio equipment or electric (arc) welding torches.  Some devices are safe to use if they are held 12 inches (30 cm) or more away from your defibrillator. These include power tools, lawn mowers, and speakers. If you are not sure if something is  safe to use, ask your health care provider.  Do not use MP3 player headphones. They have magnets.  You may safely use electric blankets, heating pads, computers, and microwave ovens. General instructions  Do not take baths, swim, shower, or use a hot tub until your health care provider approves. You may need to take sponge baths until your health care provider says that you may bathe or shower.  Do not drive until your health care provider approves.  Always keep your defibrillator ID card with you. The card should list  the implant date, device model, and manufacturer. Consider wearing a medical alert bracelet or necklace that says that you have an S-ICD.  Do not use any products that contain nicotine or tobacco, such as cigarettes and e-cigarettes. If you need help quitting, ask your health care provider.  Have your defibrillator checked as often as told by your health care provider. Most S-ICDs last for 4-8 years before they need to be replaced. Contact a health care provider if:  You feel one shock in your chest.  You gain weight suddenly.  You have a fever.  You have severe pain, and medicines do not help.  You have redness, swelling, or pain around your incision area.  You have pus or a bad smell coming from your incision area.  You have fluid or blood coming from your incision.  Your incision area feels warm to the touch.  Your heart feels like it is fluttering or skipping beats (heart palpitations).  You feel increased anxiety or depression. Get help right away if:  You feel more than one shock.  You have chest pain.  You have problems breathing or have shortness of breath.  You have dizziness or fainting. Summary  After the procedure, you may have some pain, see a bump under your skin, and feel the device under your skin.  Check your incision area every day for signs of infection.  Be careful around electric and magnetic fields.  Always keep your  defibrillator ID card with you. This information is not intended to replace advice given to you by your health care provider. Make sure you discuss any questions you have with your health care provider. Document Revised: 08/07/2017 Document Reviewed: 08/11/2016 Elsevier Patient Education  2020 [employer].

## 2020-05-18 NOTE — H&P (Signed)
I have seen Connor Vaughn is a 59 y.o. male in the office today who has been referred by Dr. Bjorn Pippin for consideration of ICD implant for primary prevention of sudden death.  The patient's chart has been reviewed and they meet criteria for ICD implant.  I have had a thorough discussion with the patient reviewing options.  The patient and their family (if available) have had opportunities to ask questions and have them answered. The patient and I have decided together through a shared decision making process to proceed with ICD at this time.  Risks, benefits, alternatives to ICD implantation were discussed in detail with the patient today. The patient  understands that the risks include but are not limited to bleeding, infection, pneumothorax, perforation, tamponade, vascular damage, renal failure, MI, stroke, death, inappropriate shocks, and lead dislodgement and he wishes to proceed.  We will therefore schedule device implantation at the next available time.

## 2020-05-18 NOTE — Interval H&P Note (Signed)
History and Physical Interval Note:  05/18/2020 7:22 AM  Connor Vaughn  has presented today for surgery, with the diagnosis of cardiomyopathy.  The various methods of treatment have been discussed with the patient and family. After consideration of risks, benefits and other options for treatment, the patient has consented to  Procedure(s): ICD IMPLANT (N/A) as a surgical intervention.  The patient's history has been reviewed, patient examined, no change in status, stable for surgery.  I have reviewed the patient's chart and labs.  Questions were answered to the patient's satisfaction.     Lewayne Bunting

## 2020-05-19 ENCOUNTER — Encounter (HOSPITAL_COMMUNITY): Payer: Self-pay | Admitting: Internal Medicine

## 2020-05-19 ENCOUNTER — Telehealth: Payer: Self-pay

## 2020-05-19 MED FILL — Lidocaine HCl Local Inj 1%: INTRAMUSCULAR | Qty: 60 | Status: AC

## 2020-05-19 MED FILL — Gentamicin Sulfate Inj 40 MG/ML: INTRAMUSCULAR | Qty: 80 | Status: AC

## 2020-05-19 NOTE — Telephone Encounter (Signed)
Follow-up after same day discharge: Implant date: 05/18/20 MD: Lewayne Bunting, MD Device: Biotronik [id number] Acticor 7 HF-T QP Location: Left chest   Wound check visit: 05/28/2020 90 day MD follow-up: 08/25/2020  Remote Transmission received: Yes  Dressing removed: Unable to verify at this time.  Attempted to contact. No answer, LMOVM.

## 2020-05-21 NOTE — Telephone Encounter (Signed)
LMOM to call device clinic, # provided.

## 2020-05-25 NOTE — Telephone Encounter (Signed)
Patient returning call. Has a wound check appt on 05/28/20

## 2020-05-28 ENCOUNTER — Ambulatory Visit: Payer: Medicaid Other

## 2020-06-02 ENCOUNTER — Ambulatory Visit (INDEPENDENT_AMBULATORY_CARE_PROVIDER_SITE_OTHER): Payer: Medicaid Other | Admitting: Emergency Medicine

## 2020-06-02 ENCOUNTER — Other Ambulatory Visit: Payer: Self-pay

## 2020-06-02 DIAGNOSIS — I422 Other hypertrophic cardiomyopathy: Secondary | ICD-10-CM | POA: Diagnosis not present

## 2020-06-02 LAB — CUP PACEART INCLINIC DEVICE CHECK
Battery Voltage: 3.12 V
Brady Statistic RV Percent Paced: 0 %
Date Time Interrogation Session: 20220111144403
HighPow Impedance: 70 Ohm
Implantable Lead Implant Date: 20211227
Implantable Lead Location: 753860
Implantable Lead Model: 436909
Implantable Lead Serial Number: 81421424
Implantable Pulse Generator Implant Date: 20211227
Lead Channel Impedance Value: 560 Ohm
Lead Channel Pacing Threshold Amplitude: 0.7 V
Lead Channel Pacing Threshold Pulse Width: 0.4 ms
Lead Channel Sensing Intrinsic Amplitude: 18.7 mV
Lead Channel Sensing Intrinsic Amplitude: 7.9 mV
Lead Channel Setting Pacing Amplitude: 4 V
Lead Channel Setting Pacing Pulse Width: 0.4 ms
Lead Channel Setting Sensing Sensitivity: 0.8 mV
Pulse Gen Model: 429522
Pulse Gen Serial Number: 84812005

## 2020-06-02 NOTE — Progress Notes (Signed)
Wound check appointment. Steri-strips removed. Wound without redness or edema. Incision edges approximated, wound well healed. Normal device function. Thresholds, sensing, and impedances consistent with implant measurements. Device programmed at 4.0 V for extra safety margin until 3 month visit. Histogram distribution appropriate for patient and level of activity. 1 HVR (100-110 bpm), + Coreg 6.25 mg BID, asymptomatic.  Patient educated about wound care, arm mobility, lifting restrictions, shock plan. ROV in 3 months with implanting physician. Next remote scheduled 08/17/20.

## 2020-07-10 ENCOUNTER — Telehealth: Payer: Self-pay | Admitting: Internal Medicine

## 2020-07-10 NOTE — Telephone Encounter (Signed)
Patient reports of bump at the incision site with a small amount of redness noted x1 week. Denies any fever or chills. Offered patient to send a picture but states he does not know how and his wife is not home to help. Patient would like to come into device clinic to have site assessed. States he can not come until Monday d/t not able to drive and wife at work. States he will call back Monday (07/13/20) morning after he finds out about transportation. ED precautions reviewed with patient with verbal understanding.

## 2020-07-10 NOTE — Telephone Encounter (Signed)
  Patient states he has what looks like a lead sticking out from the site where he had his defibrillator placed. He states it is not bleeding but wasn't sure what to do about it.

## 2020-07-14 NOTE — Telephone Encounter (Signed)
Called patient to follow up about coming into the clinic for a wound check. States he was not able to get transportation figured out but he will try this week and call back. No further complaints of worsening.

## 2020-07-21 ENCOUNTER — Telehealth: Payer: Self-pay | Admitting: Cardiology

## 2020-07-21 NOTE — Telephone Encounter (Signed)
Pt called back In returning kaylas call.  In there process of type note call drop or not sure if pt hung up .

## 2020-07-21 NOTE — Telephone Encounter (Signed)
New Message:    Pt says he needs to talk to you about his Carvedilol and his Losartan.

## 2020-07-21 NOTE — Telephone Encounter (Signed)
Left message for patient to call back  

## 2020-07-21 NOTE — Telephone Encounter (Signed)
Returned call to patient who states that he saw his PCP yesterday and was switched to Losartan 25mg  and his carvedilol was stopped. Patient states he has been taking his carvedilol as prescribed since 11/21 by Dr. 12/21. Patient states his blood pressure yesterday at his PCP was elevated but states that he was worked up during that time and anxious. Patient states his blood pressures at home prior to that appointment have been running 130-140's systolic over 80'-90's diastolic with HR's in the 90's. Patient states that this morning his blood pressure was 143/86 with HR 98. While on the phone with patient his blood pressure was 166/106 with HR 109. Patient states he is currently worked up and anxious so he thinks that may be making his blood pressure elevated. Patient is not currently having any symptoms at present but does report having a right sided chest pain about a week ago but has not had any pain recently. Patient also reports intermittent dizziness but denies any syncope.   Moved patients appointment with Dr. Bjorn Pippin to 07/27/20 at 3:40pm.   Patient states that he wants to see if Dr. 09/26/20 wants him to go ahead and stop the carvedilol and start the losartan or if should hold off on starting the losartan and stay on the carvedilol until his appointment. Patient states he has been on his carvedilol since 11/21 and did take it today. Advised patient to continue monitoring blood pressure and HR. Patient aware of ED precautions should symptoms worsen or new symptoms occur. Advised patient I would forward message to Dr. 12/21 for review and advice. Patient verbalized understanding.

## 2020-07-22 NOTE — Telephone Encounter (Signed)
In patients with hypertrophic cardiomyopathy, beta-blockers like carvedilol are preferred and vasodilators like losartan are generally avoided.  Not sure why his carvedilol would have been stopped, can we get records from his PCP?  Would recommend holding off on the losartan and continuing the carvedilol.  Continue to check BP daily and bring log to appointment next week.

## 2020-07-22 NOTE — Telephone Encounter (Signed)
Melissa from [facility] center calling back. Phone: [phone]

## 2020-07-22 NOTE — Telephone Encounter (Signed)
Called PCP office to discuss-spoke to nurse.  She states she would send a message to primary provider to clarify and call back.  Advised her of Dr. Bjorn Pippin recommendations as well.

## 2020-07-23 NOTE — Telephone Encounter (Signed)
Attempt to return call to [facility] at [facility] hold for 20 mins.   Will reattempt

## 2020-07-23 NOTE — Telephone Encounter (Signed)
Spoke to [place] NP, she states at last OV patients BP was elevated so she increased his carvedilol to 12.5 mg BID.  He came in again on Monday and BP was still elevated.   She recommended adding losartan in addition to the coreg for BP control.     Called patient, he is taking coreg 12.5 mg BID and has not picked up losartan yet.   He has an appt with Dr. Bjorn Pippin on Monday and wanted to make sure this was okay with him prior to starting.   He has a BP cuff at home.  Advised to check BP 2 times daily and write it down, bring BP log and cuff to appt on Monday for Dr. Bjorn Pippin to review.  Advised to continue carvedilol, hold off on starting losartan.  Patient agreed and verbalized understanding.

## 2020-07-23 NOTE — Telephone Encounter (Signed)
Call placed to [facility], call was disconnected
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
## 2020-07-27 ENCOUNTER — Ambulatory Visit: Payer: Medicaid Other | Admitting: Cardiology

## 2020-07-28 ENCOUNTER — Telehealth: Payer: Self-pay

## 2020-07-28 NOTE — Telephone Encounter (Signed)
Received fax from [facility] office  "Pt was seen in our office on 07/20/20 and his carvedilol was increased from 6.25 to 12.5 mg and his losartan was decreased from 50 mg to 25 mg.  Please call if you have any other questions"

## 2020-07-31 NOTE — Telephone Encounter (Signed)
Patient reports that his wound site has no issue. Reports no edema, drainage, redness or bleeding. Confirmed next appointment with Dr Ladona Ridgel 08/25/20 and stressed importance of follow-up. Patient verbalized understanding.

## 2020-07-31 NOTE — Telephone Encounter (Signed)
LMOM to call device clinic and device clinic # provided.

## 2020-08-14 ENCOUNTER — Ambulatory Visit: Payer: Medicaid Other | Admitting: Cardiology

## 2020-08-17 ENCOUNTER — Ambulatory Visit (INDEPENDENT_AMBULATORY_CARE_PROVIDER_SITE_OTHER): Payer: Medicaid Other

## 2020-08-17 DIAGNOSIS — I422 Other hypertrophic cardiomyopathy: Secondary | ICD-10-CM

## 2020-08-17 DIAGNOSIS — R55 Syncope and collapse: Secondary | ICD-10-CM
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
## 2020-08-18 LAB — CUP PACEART REMOTE DEVICE CHECK
Date Time Interrogation Session: 20220328145036
Implantable Lead Implant Date: 20211227
Implantable Lead Location: 753860
Implantable Lead Model: 436909
Implantable Lead Serial Number: 81421424
Implantable Pulse Generator Implant Date: 20211227
Pulse Gen Model: 429522
Pulse Gen Serial Number: 84812005

## 2020-08-19 ENCOUNTER — Other Ambulatory Visit: Payer: Self-pay

## 2020-08-19 ENCOUNTER — Ambulatory Visit (INDEPENDENT_AMBULATORY_CARE_PROVIDER_SITE_OTHER): Payer: Medicaid Other | Admitting: Cardiology

## 2020-08-19 ENCOUNTER — Encounter: Payer: Self-pay | Admitting: Cardiology

## 2020-08-19 ENCOUNTER — Telehealth: Payer: Self-pay | Admitting: Cardiology

## 2020-08-19 VITALS — BP 114/84 | HR 83 | Ht 66.0 in | Wt 215.4 lb

## 2020-08-19 DIAGNOSIS — R55 Syncope and collapse: Secondary | ICD-10-CM

## 2020-08-19 DIAGNOSIS — I422 Other hypertrophic cardiomyopathy: Secondary | ICD-10-CM

## 2020-08-19 DIAGNOSIS — R079 Chest pain, unspecified: Secondary | ICD-10-CM

## 2020-08-19 DIAGNOSIS — R0600 Dyspnea, unspecified: Secondary | ICD-10-CM

## 2020-08-19 DIAGNOSIS — Q231 Congenital insufficiency of aortic valve: Secondary | ICD-10-CM | POA: Diagnosis not present

## 2020-08-19 DIAGNOSIS — R0609 Other forms of dyspnea: Secondary | ICD-10-CM

## 2020-08-19 NOTE — Telephone Encounter (Signed)
Follow Up:    Pt called back with the list of Cardia medicine he takes.  Carvedilol 6.5 mg per Dr Bjorn Pippin and primary doctor changed it to 12.5 mg- Spironolactone 25 mg  Furosemide 40 mg, Ferosul Sulf 3.25 mg Metoprolol 50 mg

## 2020-08-19 NOTE — Patient Instructions (Signed)
Medication Instructions:  Call when you get home to report your medications  *If you need a refill on your cardiac medications before your next appointment, please call your pharmacy*  Testing/Procedures: Coronary CTA (see instructions below)  Follow-Up: At [facility], you and your health needs are our priority.  As part of our continuing mission to provide you with exceptional heart care, we have created designated Provider Care Teams.  These Care Teams include your primary Cardiologist (physician) and Advanced Practice Providers (APPs -  Physician Assistants and Nurse Practitioners) who all work together to provide you with the care you need, when you need it.  We recommend signing up for the patient portal called "MyChart".  Sign up information is provided on this After Visit Summary.  MyChart is used to connect with patients for Virtual Visits (Telemedicine).  Patients are able to view lab/test results, encounter notes, upcoming appointments, etc.  Non-urgent messages can be sent to your provider as well.   To learn more about what you can do with MyChart, go to [email or web address].    Your next appointment:   3 month(s)  The format for your next appointment:   In Person  Provider:   Epifanio Lesches, MD      Coronary CTA instructions: Your cardiac CT will be scheduled at one of the below locations:   [facility] [address] [place], [place] [address] [phone]  OR  [facility] [address] [place], [place] [address] [phone]  If scheduled at [facility], please arrive at the [facility] main entrance (entrance A) of [facility] 30 minutes prior to test start time. Proceed to the [facility] Radiology Department (first floor) to check-in and test prep.  If scheduled at [facility], please arrive 15 mins early for check-in and test prep.  Please  follow these instructions carefully (unless otherwise directed):  Hold all erectile dysfunction medications at least 3 days (72 hrs) prior to test.  On the Night Before the Test: . Be sure to Drink plenty of water. . Do not consume any caffeinated/decaffeinated beverages or chocolate 12 hours prior to your test. . Do not take any antihistamines 12 hours prior to your test.  On the Day of the Test: . Drink plenty of water until 1 hour prior to the test. . Do not eat any food 4 hours prior to the test. . You may take your regular medications prior to the test.  . HOLD CARVEDILOL (COREG) AM OF PROCEDURE . Take 100mg  metoprolol (Lopressor) two hours prior to test. . HOLD Furosemide morning of the test.    After the Test: . Drink plenty of water. . After receiving IV contrast, you may experience a mild flushed feeling. This is normal. . On occasion, you may experience a mild rash up to 24 hours after the test. This is not dangerous. If this occurs, you can take Benadryl 25 mg and increase your fluid intake. . If you experience trouble breathing, this can be serious. If it is severe call 911 IMMEDIATELY. If it is mild, please call our office. . If you take any of these medications: Glipizide/Metformin, Avandament, Glucavance, please do not take 48 hours after completing test unless otherwise instructed.   Once we have confirmed authorization from your insurance company, we will call you to set up a date and time for your test. Based on how quickly your insurance processes prior authorizations requests, please allow  up to 4 weeks to be contacted for scheduling your Cardiac CT appointment. Be advised that routine Cardiac CT appointments could be scheduled as many as 8 weeks after your provider has ordered it.  For non-scheduling related questions, please contact the cardiac imaging nurse navigator should you have any questions/concerns: Rockwell Alexandria, Cardiac Imaging Nurse Navigator Larey Brick, Cardiac Imaging Nurse Navigator [facility] Direct Office Dial: [phone]   For scheduling needs, including cancellations and rescheduling, please call [place], [phone].

## 2020-08-19 NOTE — Telephone Encounter (Signed)
Let's stop metoprolol and increase coreg to 12.5 mg BID

## 2020-08-19 NOTE — Telephone Encounter (Signed)
Spoke to patient-aware to STOP metoprolol and continue continue carvedilol 12.5 mg two times daily.       He is aware AM of CCTA scan HOLD AM dose of coreg Take metoprolol 100 mg 2 hours prior  Patient changes insurance plans Monday and will call with updated information prior to submitting for authorization/sending in medication (to avoid confusion).

## 2020-08-19 NOTE — Telephone Encounter (Signed)
Left message to call back  Forwarded to MD to review --patient on carvedilol and metoprolol?

## 2020-08-24 NOTE — Addendum Note (Signed)
Addended by: Derenda Fennel on: 08/24/2020 05:41 PM   Modules accepted: Orders

## 2020-08-25 ENCOUNTER — Encounter: Payer: Medicaid Other | Admitting: Internal Medicine

## 2020-08-27 NOTE — Progress Notes (Signed)
Remote ICD transmission.   

## 2020-09-08 NOTE — Telephone Encounter (Signed)
Message sent to billing department to get updated insurance info

## 2020-09-08 NOTE — Telephone Encounter (Signed)
Billing left message for patient to call back with new insurance information

## 2020-09-10 NOTE — Telephone Encounter (Signed)
Order placed for CCTA-[facility] working on authorization.

## 2020-09-24 ENCOUNTER — Encounter: Payer: Self-pay | Admitting: *Deleted

## 2020-09-24 NOTE — Telephone Encounter (Signed)
This encounter was created in error - please disregard.

## 2020-09-24 NOTE — Telephone Encounter (Signed)
Informed CCTA scheduled 5/11 at [facility]?   [facility] hospital unable to do CCTA)  Connor Vaughn with [facility] at [phone] to discuss.   Advised Heart Calcium score ordered.  Advised this is incorrect and we ordered a coronary CTA-advised this is completed at [facility], [place], or [place].    Advised they will cancel calcium score scheduled next week.  Request order for CCTA faxed to [phone], they will get authorization and fax back to me.   Once auth received will get scheduled (preferrably on a center day) as they provide transportation for patient.

## 2020-10-05 ENCOUNTER — Telehealth: Payer: Self-pay | Admitting: Cardiology

## 2020-10-05 NOTE — Telephone Encounter (Addendum)
Attempt to return call x 2-continues to ring and then call ends.    Patient is having CCTA-needed PA through [employer].    They will need to fax to our office so we have auth to schedule.   Unsure how long Berkley Harvey is approved for-CCTA currently on hold.

## 2020-10-05 NOTE — Telephone Encounter (Signed)
Lawson Fiscal from [employer] care states she needs the exact test that the patient is needing done. She states she needs to know where to send the authorization for it. Phone: [phone]

## 2020-10-08 ENCOUNTER — Telehealth: Payer: Self-pay | Admitting: Cardiology

## 2020-10-08 NOTE — Telephone Encounter (Signed)
Spoke to Connor Vaughn at [place] states she has auth for CCTA.  She is aware CCTAs are on hold due to contrast shortage.  Berkley Harvey is thru 07/31.  She will fax auth on to have on file for scheduling once able.   Fax # provided.   Will follow up with her once scheduled.

## 2020-10-08 NOTE — Telephone Encounter (Signed)
Connor Vaughn, she states that she has not heard anything in regards to the CTA, she states that they tried to schedule but they were unable to get it done in [place]? - she would like a call back from [place] just to make sure they are on the same page.  I will route to RN.  She was thankful for call back.

## 2020-10-08 NOTE — Telephone Encounter (Signed)
Follow up:     Lawson Fiscal from [facility] calling concering this patient she would like to touch bases with the nurse.

## 2020-10-09 NOTE — Telephone Encounter (Signed)
Auth received via fax-sent to auth department to confirm and have on file to schedule.

## 2020-11-05 ENCOUNTER — Other Ambulatory Visit (HOSPITAL_COMMUNITY): Payer: Self-pay | Admitting: *Deleted

## 2020-11-05 ENCOUNTER — Telehealth (HOSPITAL_COMMUNITY): Payer: Self-pay | Admitting: *Deleted

## 2020-11-05 DIAGNOSIS — R079 Chest pain, unspecified: Secondary | ICD-10-CM

## 2020-11-05 NOTE — Telephone Encounter (Signed)
Reaching out to patient to offer assistance regarding upcoming cardiac imaging study; pt's care agency verbalizes understanding of appt date/time, parking situation and where to check in, pre-test NPO status and medications ordered, and verified current allergies; name and call back number provided for further questions should they arise  Larey Brick RN Navigator Cardiac Imaging [facility] [phone] office [phone] cell  Instructions given to Lawson Fiscal from [employer] and will be faxed to her as well.  Lawson Fiscal will arrange for pt's lab draw and fax results over once she has them. Will also call patient to go over instructions per Lori's request.

## 2020-11-06 ENCOUNTER — Telehealth (HOSPITAL_COMMUNITY): Payer: Self-pay | Admitting: *Deleted

## 2020-11-06 NOTE — Telephone Encounter (Signed)
Attempted to call patient regarding upcoming cardiac CT appointment. °Left message on voicemail with name and callback number ° °Sweet Jarvis RN Navigator Cardiac Imaging °[facility] °[phone] Office °[phone] Cell ° °

## 2020-11-09 ENCOUNTER — Other Ambulatory Visit: Payer: Self-pay

## 2020-11-09 ENCOUNTER — Ambulatory Visit (HOSPITAL_COMMUNITY)
Admission: RE | Admit: 2020-11-09 | Discharge: 2020-11-09 | Disposition: A | Discharge status: Home / Self Care | Payer: Medicaid Other | Source: Ambulatory Visit | Attending: Cardiology | Admitting: Cardiology

## 2020-11-09 DIAGNOSIS — I422 Other hypertrophic cardiomyopathy: Secondary | ICD-10-CM | POA: Diagnosis present

## 2020-11-09 DIAGNOSIS — I700 Atherosclerosis of aorta: Secondary | ICD-10-CM

## 2020-11-09 DIAGNOSIS — R0600 Dyspnea, unspecified: Secondary | ICD-10-CM | POA: Diagnosis present

## 2020-11-09 DIAGNOSIS — R079 Chest pain, unspecified: Secondary | ICD-10-CM | POA: Diagnosis not present

## 2020-11-09 DIAGNOSIS — R0609 Other forms of dyspnea: Secondary | ICD-10-CM

## 2020-11-09 MED ORDER — NITROGLYCERIN 0.4 MG SL SUBL: 0.8000 mg | SUBLINGUAL_TABLET | Freq: Once | SUBLINGUAL | Status: AC

## 2020-11-09 MED ORDER — NITROGLYCERIN 0.4 MG SL SUBL
SUBLINGUAL_TABLET | SUBLINGUAL | Status: AC
  Administered 2020-11-09: 14:00:00 0.8 mg via SUBLINGUAL
  Filled 2020-11-09: qty 2

## 2020-11-09 MED ORDER — IOHEXOL 300 MG/ML  SOLN
100.0000 mL | Freq: Once | INTRAMUSCULAR | Status: AC | PRN
  Administered 2020-11-09: 14:00:00 100 mL via INTRAVENOUS

## 2020-11-12 ENCOUNTER — Telehealth: Payer: Self-pay | Admitting: Cardiology

## 2020-11-12 ENCOUNTER — Other Ambulatory Visit: Payer: Self-pay | Admitting: *Deleted

## 2020-11-12 DIAGNOSIS — R911 Solitary pulmonary nodule: Secondary | ICD-10-CM

## 2020-11-12 NOTE — Telephone Encounter (Signed)
Spoke to patient-patient aware of results and verbalized understanding.   Verbal permission given to speak to Lawson Fiscal at [facility] as they coordinate his care and will do labs and coordinate follow up appts.    Left message for Lawson Fiscal to call back   Patient also wondering when he is released to drive again.   He reports last syncopal episode was "about" when he saw Dr. Bjorn Pippin last (March).   Advised this is typically 37m per [place] driving restrictions but will confirm with Dr. Bjorn Pippin.

## 2020-11-12 NOTE — Telephone Encounter (Signed)
Lawson Fiscal with [facility] is requesting to go over the patient's CTA results.

## 2020-11-13 NOTE — Telephone Encounter (Signed)
Yes needs to be syncope free for 6 months before can drive again

## 2020-11-16 ENCOUNTER — Ambulatory Visit (INDEPENDENT_AMBULATORY_CARE_PROVIDER_SITE_OTHER): Payer: Medicaid Other

## 2020-11-16 DIAGNOSIS — I422 Other hypertrophic cardiomyopathy: Secondary | ICD-10-CM | POA: Diagnosis not present

## 2020-11-17 LAB — CUP PACEART REMOTE DEVICE CHECK
Date Time Interrogation Session: 20220627114743
Implantable Lead Implant Date: 20211227
Implantable Lead Location: 753860
Implantable Lead Model: 436909
Implantable Lead Serial Number: 81421424
Implantable Pulse Generator Implant Date: 20211227
Pulse Gen Model: 429522
Pulse Gen Serial Number: 84812005

## 2020-11-20 NOTE — Telephone Encounter (Signed)
Discussed with nurse and NP at [facility] of results and follow up needed.  States labs completed April, will fax those to office.   Also aware of CT needed in 12 months.  Result faxed to NP.      Also attempted to call patient to make aware he must be syncope free for 6 months to drive.  Left detailed message to make aware (ok per DPR).

## 2020-11-24 IMAGING — CR DG CHEST 2V
2 series · 2 of 2 positions shown · non-contrast
Comparison: 12/03/2019

CLINICAL DATA: Syncope, orthostatic hypotension

EXAM:
CHEST - 2 VIEW

[w chest lat]
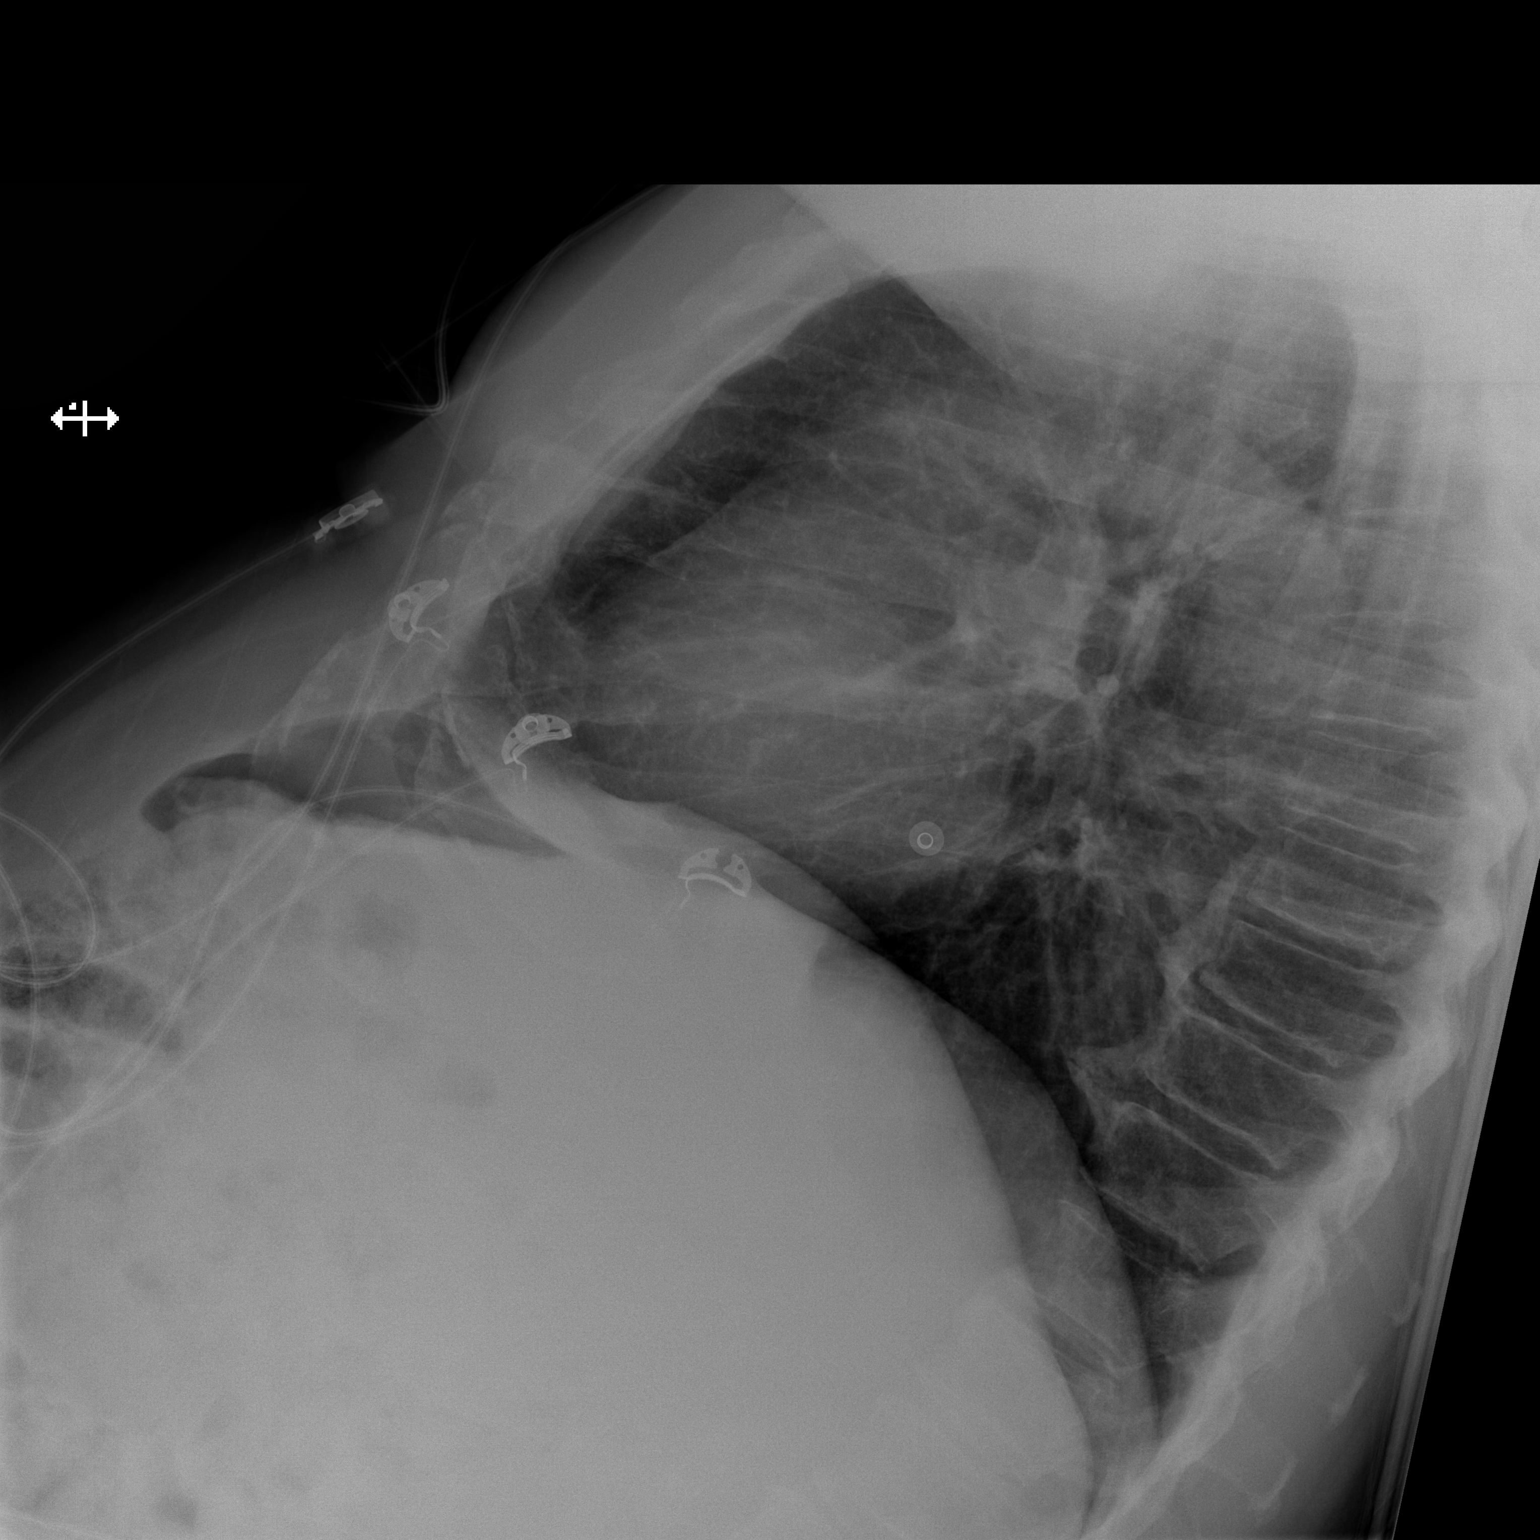

[x chest ap]
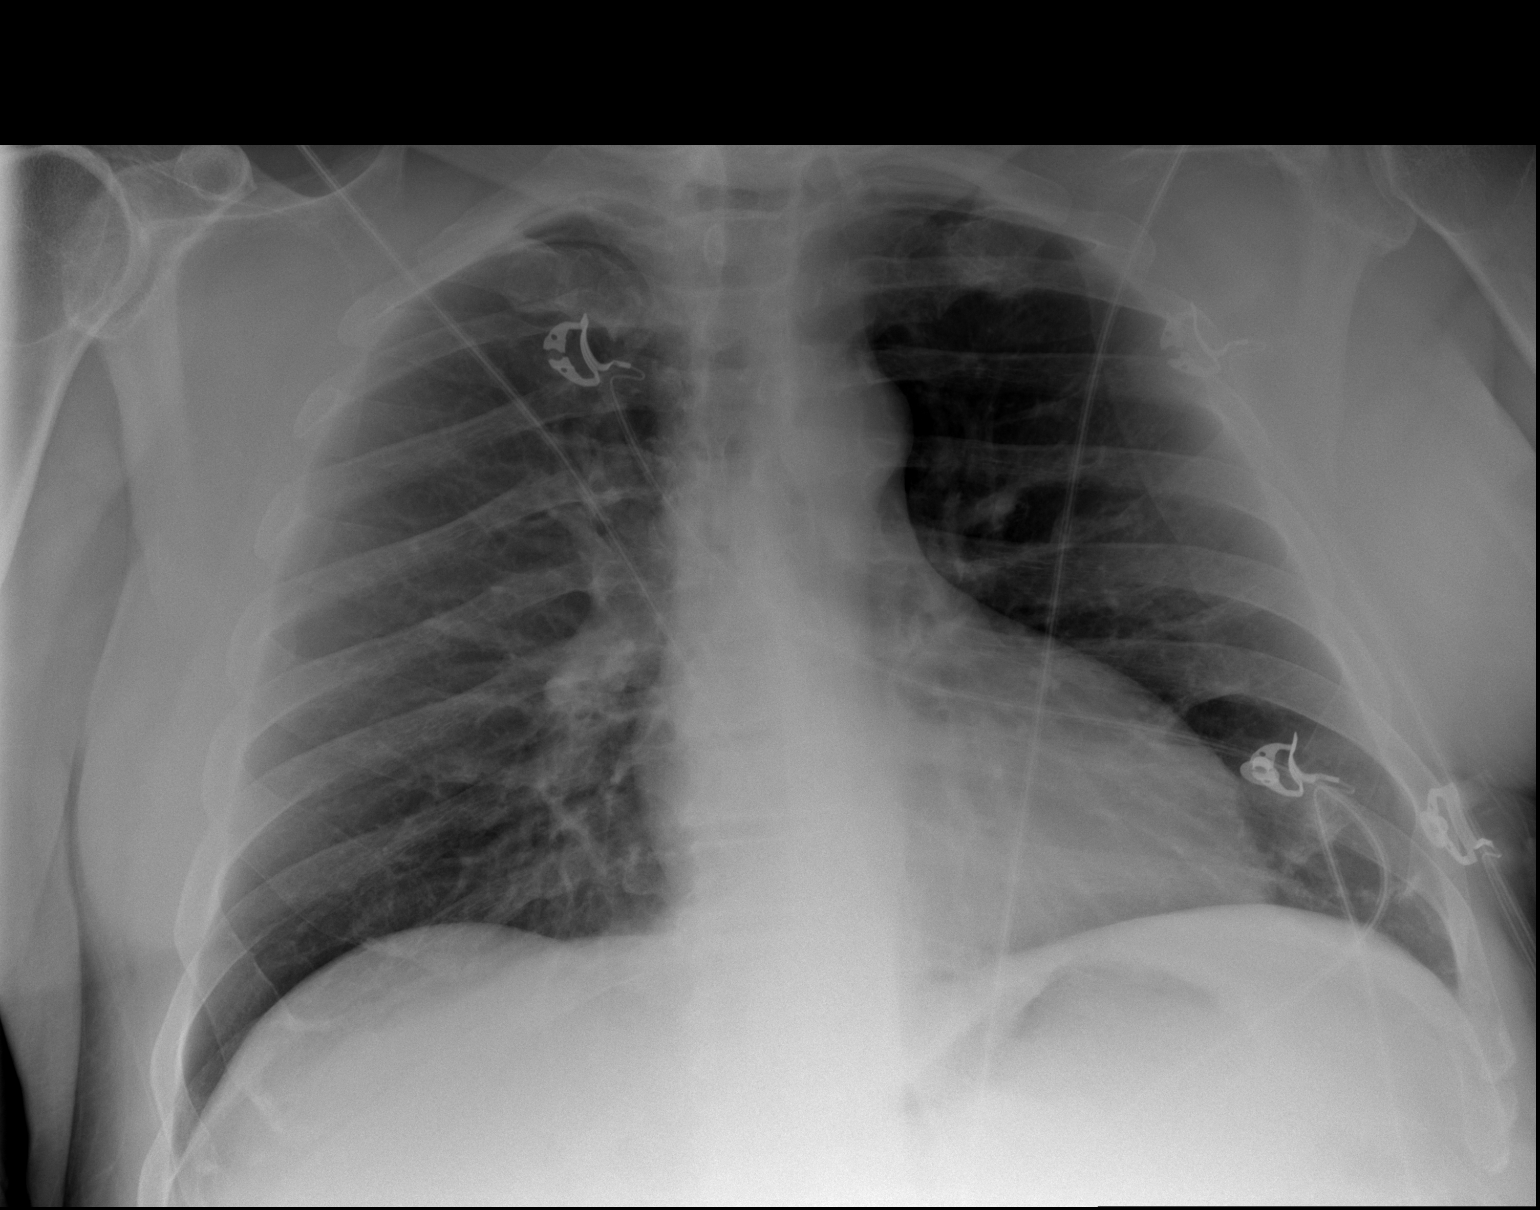

[2 of 2 positions shown; findings below may reference images not displayed]

FINDINGS: Frontal and lateral views of the chest demonstrate an unremarkable
cardiac silhouette. No airspace disease, effusion, or pneumothorax.
No acute bony abnormalities.
IMPRESSION: 1. No acute intrathoracic process.

## 2020-12-07 ENCOUNTER — Telehealth: Payer: Self-pay

## 2020-12-07 NOTE — Progress Notes (Signed)
Remote ICD transmission.   

## 2020-12-07 NOTE — Telephone Encounter (Signed)
Alert transmission received for HVR, this is known issue, will continue to monitor.  Noted that RV outputs programmed high.  Pt was scheduled for 08/25/20 91 day f/u, he did not show for appt.  Spoke with pt, he reports it was due to no transportation.  Stressed importance of appt.  Advised scheduling will contact to reschedule

## 2020-12-28 NOTE — Progress Notes (Signed)
Electrophysiology Office Note Date: 12/28/2020  ID:  Connor Vaughn, DOB 1961/05/04, MRN [id number]  PCP: [facility] Primary Cardiologist: Little Ishikawa, MD Electrophysiologist: Lewayne Bunting, MD   CC: Routine ICD follow-up  Connor Vaughn is a 60 y.o. male seen today for Lewayne Bunting, MD for routine electrophysiology followup.  Since last being seen in our clinic the patient reports doing well. He did have syncope several months ago in the setting of what sounds like a panic attack. No episode on device. he denies chest pain, palpitations, dyspnea, PND, orthopnea, nausea, vomiting, dizziness, edema, weight gain, or early satiety. He has not had ICD shocks.   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 and h/o syncope  Past Medical History:  Diagnosis Date   Cirrhosis (HCC)    Diabetes mellitus, type II (HCC)    DKA (diabetic ketoacidoses) 04/04/2019   H/O fracture    nasal x 2   Sleep apnea    UGIB (upper gastrointestinal bleed) 12/2019   Past Surgical History:  Procedure Laterality Date   ESOPHAGEAL BANDING  01/14/2020   Procedure: ESOPHAGEAL VARICES BANDING;  Surgeon: Jeani Hawking, MD;  Location: [facility];  Service: Endoscopy;;   ESOPHAGOGASTRODUODENOSCOPY N/A 01/13/2020   Procedure: ESOPHAGOGASTRODUODENOSCOPY (EGD);  Surgeon: Charna Elizabeth, MD;  Location: [facility];  Service: Endoscopy;  Laterality: N/A;   ESOPHAGOGASTRODUODENOSCOPY (EGD) WITH PROPOFOL N/A 04/05/2019   Procedure: ESOPHAGOGASTRODUODENOSCOPY (EGD) WITH PROPOFOL;  Surgeon: Jeani Hawking, MD;  Location: [facility];  Service: Endoscopy;  Laterality: N/A;   ESOPHAGOGASTRODUODENOSCOPY (EGD) WITH PROPOFOL N/A 01/14/2020   Procedure: ESOPHAGOGASTRODUODENOSCOPY (EGD) WITH PROPOFOL;  Surgeon: Jeani Hawking, MD;  Location: [facility];  Service: Endoscopy;  Laterality: N/A;   HERNIA REPAIR     Age 64   ICD IMPLANT N/A 05/18/2020   Procedure: ICD IMPLANT;   Surgeon: Marinus Maw, MD;  Location: [facility] INVASIVE CV LAB;  Service: Cardiovascular;  Laterality: N/A;   LUMBAR LAMINECTOMY/DECOMPRESSION MICRODISCECTOMY N/A 12/15/2019   Procedure: LUMBAR THREE-SACRAL ONE LAMINECTOMY WITH LUMBAR FOUR-FIVE DISCECTOMY ;  Surgeon: Bethann Goo, DO;  Location: [facility] OR;  Service: Neurosurgery;  Laterality: N/A;   thoracocenteis      Current Outpatient Medications  Medication Sig Dispense Refill   busPIRone (BUSPAR) 10 MG tablet Take 10 mg by mouth 3 (three) times daily.     carvedilol (COREG) 12.5 MG tablet Take 12.5 mg by mouth 2 (two) times daily with a meal.     CYMBALTA 60 MG capsule Take 60 mg by mouth daily.     FEROSUL 325 (65 Fe) MG tablet Take 325 mg by mouth daily.     furosemide (LASIX) 40 MG tablet Take 1 tablet (40 mg total) by mouth daily. 30 tablet 0   gabapentin (NEURONTIN) 300 MG capsule Take 1 capsule (300 mg total) by mouth 2 (two) times daily. 60 capsule 0   glipiZIDE (GLUCOTROL) 10 MG tablet Take 10 mg by mouth 2 (two) times daily.     HYDROcodone-acetaminophen (NORCO/VICODIN) 5-325 MG tablet Take 1 tablet by mouth every 8 (eight) hours as needed. for pain (Patient not taking: Reported on 11/09/2020)     hydrOXYzine (ATARAX/VISTARIL) 25 MG tablet Take 50 mg by mouth 2 (two) times daily as needed for itching.     methocarbamol (ROBAXIN) 500 MG tablet Take 1 tablet (500 mg total) by mouth every 8 (eight) hours as needed for muscle spasms. 15 tablet 0   spironolactone (ALDACTONE) 25 MG tablet Take 1  tablet (25 mg total) by mouth daily. 30 tablet 0   traMADol (ULTRAM) 50 MG tablet Take 50 mg by mouth as needed.     No current facility-administered medications for this visit.    Allergies:   Latex   Social History: Social History   Socioeconomic History   Marital status: Married    Spouse name: Not on file   Number of children: 2   Years of education: Not on file   Highest education level: High school graduate  Occupational History    Not on file  Tobacco Use   Smoking status: Former    Types: Cigarettes    Quit date: 2010    Years since quitting: 12.6   Smokeless tobacco: Never  Vaping Use   Vaping Use: Never used  Substance and Sexual Activity   Alcohol use: Yes    Comment: "pint every few days"   Drug use: No   Sexual activity: Yes  Other Topics Concern   Not on file  Social History Narrative   Lives at home with his wife   Right handed   No caffeine   Social Determinants of Corporate investment banker Strain: Not on file  Food Insecurity: Not on file  Transportation Needs: Not on file  Physical Activity: Not on file  Stress: Not on file  Social Connections: Not on file  Intimate Partner Violence: Not on file    Family History: Family History  Problem Relation Age of Onset   Diabetes Mother    Heart failure Mother    Valvular heart disease Mother    Diabetes Mellitus II Father    Heart failure Father    Dementia Father    Seizures Neg Hx     Review of Systems: All other systems reviewed and are otherwise negative except as noted above.   Physical Exam: There were no vitals filed for this visit.   GEN- The patient is well appearing, alert and oriented x 3 today.   HEENT: normocephalic, atraumatic; sclera clear, conjunctiva pink; hearing intact; oropharynx clear; neck supple, no JVP Lymph- no cervical lymphadenopathy Lungs- Clear to ausculation bilaterally, normal work of breathing.  No wheezes, rales, rhonchi Heart- Regular rate and rhythm, no murmurs, rubs or gallops, PMI not laterally displaced GI- soft, non-tender, non-distended, bowel sounds present, no hepatosplenomegaly Extremities- no clubbing or cyanosis. No edema; DP/PT/radial pulses 2+ bilaterally MS- no significant deformity or atrophy Skin- warm and dry, no rash or lesion; ICD pocket well healed Psych- euthymic mood, full affect Neuro- strength and sensation are intact  ICD interrogation- reviewed in detail today,  See  PACEART report  EKG:  EKG is ordered today. Personal review of EKG today shows NSR at 89, stable intervals   Recent Labs: 01/14/2020: TSH 0.513 01/18/2020: ALT 119 04/10/2020: Magnesium 2.0 04/22/2020: BUN 11; Creatinine, Ser 0.98; Hemoglobin 12.2; Platelets 99; Potassium 4.0; Sodium 136   Wt Readings from Last 3 Encounters:  08/19/20 215 lb 6.4 oz (97.7 kg)  05/18/20 210 lb (95.3 kg)  04/22/20 211 lb 9.6 oz (96 kg)     Other studies Reviewed: Additional studies/ records that were reviewed today include: Previous EP and gen cards notes  Assessment and Plan:  1.  HCM  s/p Biotronik single chamber ICD  Echo 04/07/2020 LVEF 60-65% euvolemic today Stable on an appropriate medical regimen Normal ICD function See Pace Art report No changes today  2. H/o Syncope Recurrence in the last few months, he thinks about 3 months  ago. No episode on device to correlate  3. NSVT Increase coreg to 18.75 mg BID  Labs today  Current medicines are reviewed at length with the patient today.   The patient does not have concerns regarding his medicines.  The following changes were made today:  increase coreg with elevated HRs at baseline and NSVT.  Labs/ tests ordered today include:  No orders of the defined types were placed in this encounter.  Disposition:   Follow up with Dr. Ladona Ridgel  12 months   Signed, Graciella Freer, PA-C  12/28/2020 8:10 AM  [facility] [address] [place] [address] [phone] (office) [phone] (fax)

## 2020-12-29 ENCOUNTER — Other Ambulatory Visit: Payer: Self-pay

## 2020-12-29 ENCOUNTER — Ambulatory Visit (INDEPENDENT_AMBULATORY_CARE_PROVIDER_SITE_OTHER): Payer: Medicaid Other | Admitting: Student

## 2020-12-29 ENCOUNTER — Encounter: Payer: Self-pay | Admitting: Student

## 2020-12-29 VITALS — BP 148/98 | HR 89 | Ht 66.0 in | Wt 214.6 lb

## 2020-12-29 DIAGNOSIS — I472 Ventricular tachycardia: Secondary | ICD-10-CM | POA: Diagnosis not present

## 2020-12-29 DIAGNOSIS — I4729 Other ventricular tachycardia: Secondary | ICD-10-CM

## 2020-12-29 DIAGNOSIS — I422 Other hypertrophic cardiomyopathy: Secondary | ICD-10-CM

## 2020-12-29 DIAGNOSIS — R55 Syncope and collapse: Secondary | ICD-10-CM

## 2020-12-29 LAB — CUP PACEART INCLINIC DEVICE CHECK
Battery Voltage: 3.11 V
Brady Statistic RV Percent Paced: 0 %
Date Time Interrogation Session: 20220809130302
HighPow Impedance: 79 Ohm
Implantable Lead Implant Date: 20211227
Implantable Lead Location: 753860
Implantable Lead Model: 436909
Implantable Lead Serial Number: 81421424
Implantable Pulse Generator Implant Date: 20211227
Lead Channel Impedance Value: 619 Ohm
Lead Channel Pacing Threshold Amplitude: 0.7 V
Lead Channel Pacing Threshold Pulse Width: 0.4 ms
Lead Channel Sensing Intrinsic Amplitude: 10.9 mV
Lead Channel Sensing Intrinsic Amplitude: 19.9 mV
Lead Channel Setting Pacing Amplitude: 2.5 V
Lead Channel Setting Pacing Pulse Width: 0.4 ms
Lead Channel Setting Sensing Sensitivity: 0.8 mV
Pulse Gen Model: 429522
Pulse Gen Serial Number: 84812005

## 2020-12-29 MED ORDER — CARVEDILOL 12.5 MG PO TABS: 18.7500 mg | ORAL_TABLET | Freq: Two times a day (BID) | ORAL | 6 refills | Status: DC

## 2020-12-29 NOTE — Patient Instructions (Signed)
Medication Instructions:  Your physician recommends that you continue on your current medications as directed. Please refer to the Current Medication list given to you today.  *If you need a refill on your cardiac medications before your next appointment, please call your pharmacy*   Lab Work: TODAY: CMET, Mg, CBC  If you have labs (blood work) drawn today and your tests are completely normal, you will receive your results only by: MyChart Message (if you have MyChart) OR A paper copy in the mail If you have any lab test that is abnormal or we need to change your treatment, we will call you to review the results.   Follow-Up: At [facility], you and your health needs are our priority.  As part of our continuing mission to provide you with exceptional heart care, we have created designated Provider Care Teams.  These Care Teams include your primary Cardiologist (physician) and Advanced Practice Providers (APPs -  Physician Assistants and Nurse Practitioners) who all work together to provide you with the care you need, when you need it.  We recommend signing up for the patient portal called "MyChart".  Sign up information is provided on this After Visit Summary.  MyChart is used to connect with patients for Virtual Visits (Telemedicine).  Patients are able to view lab/test results, encounter notes, upcoming appointments, etc.  Non-urgent messages can be sent to your provider as well.   To learn more about what you can do with MyChart, go to [email or web address].    Your next appointment:   2 mth f/u with Dr Bjorn Pippin  1 year(s)  The format for your next appointment:   In Person  Provider:   You may see Lewayne Bunting, MD or one of the following Advanced Practice Providers on your designated Care Team:   Francis Dowse, [place] "[facility]" [place], [place]

## 2020-12-30 LAB — COMPREHENSIVE METABOLIC PANEL
ALT: 22 IU/L (ref 0–44)
AST: 34 IU/L (ref 0–40)
Albumin/Globulin Ratio: 1.9 (ref 1.2–2.2)
Albumin: 4.1 g/dL (ref 3.8–4.9)
Alkaline Phosphatase: 108 IU/L (ref 44–121)
BUN/Creatinine Ratio: 16 (ref 9–20)
BUN: 16 mg/dL (ref 6–24)
Bilirubin Total: 1 mg/dL (ref 0.0–1.2)
CO2: 24 mmol/L (ref 20–29)
Calcium: 8.9 mg/dL (ref 8.7–10.2)
Chloride: 101 mmol/L (ref 96–106)
Creatinine, Ser: 0.97 mg/dL (ref 0.76–1.27)
Globulin, Total: 2.2 g/dL (ref 1.5–4.5)
Glucose: 247 mg/dL — ABNORMAL HIGH (ref 65–99)
Potassium: 4.1 mmol/L (ref 3.5–5.2)
Sodium: 137 mmol/L (ref 134–144)
Total Protein: 6.3 g/dL (ref 6.0–8.5)
eGFR: 90 mL/min/{1.73_m2} (ref >59)

## 2020-12-30 LAB — MAGNESIUM: Magnesium: 2 mg/dL (ref 1.6–2.3)

## 2020-12-30 LAB — CBC
Hematocrit: 44.7 % (ref 37.5–51.0)
Hemoglobin: 15.1 g/dL (ref 13.0–17.7)
MCH: 29.1 pg (ref 26.6–33.0)
MCHC: 33.8 g/dL (ref 31.5–35.7)
MCV: 86 fL (ref 79–97)
Platelets: 85 10*3/uL — CL (ref 150–450)
RBC: 5.19 x10E6/uL (ref 4.14–5.80)
RDW: 13.4 % (ref 11.6–15.4)
WBC: 4.4 10*3/uL (ref 3.4–10.8)

## 2021-02-15 ENCOUNTER — Ambulatory Visit (INDEPENDENT_AMBULATORY_CARE_PROVIDER_SITE_OTHER): Payer: Medicaid Other

## 2021-02-15 DIAGNOSIS — I422 Other hypertrophic cardiomyopathy: Secondary | ICD-10-CM | POA: Diagnosis not present

## 2021-02-16 LAB — CUP PACEART REMOTE DEVICE CHECK
Date Time Interrogation Session: 20220926155940
Implantable Lead Implant Date: 20211227
Implantable Lead Location: 753860
Implantable Lead Model: 436909
Implantable Lead Serial Number: 81421424
Implantable Pulse Generator Implant Date: 20211227
Pulse Gen Model: 429522
Pulse Gen Serial Number: 84812005

## 2021-02-20 ENCOUNTER — Other Ambulatory Visit: Payer: Self-pay

## 2021-02-20 ENCOUNTER — Emergency Department (HOSPITAL_COMMUNITY): Payer: Medicaid Other

## 2021-02-20 ENCOUNTER — Encounter (HOSPITAL_COMMUNITY): Payer: Self-pay | Admitting: Emergency Medicine

## 2021-02-20 ENCOUNTER — Emergency Department (HOSPITAL_COMMUNITY)
Admission: EM | Admit: 2021-02-20 | Discharge: 2021-02-20 | Disposition: A | Discharge status: Home / Self Care | Payer: Medicaid Other | Attending: Emergency Medicine | Admitting: Emergency Medicine

## 2021-02-20 DIAGNOSIS — R0781 Pleurodynia: Secondary | ICD-10-CM | POA: Insufficient documentation

## 2021-02-20 DIAGNOSIS — E1122 Type 2 diabetes mellitus with diabetic chronic kidney disease: Secondary | ICD-10-CM | POA: Diagnosis not present

## 2021-02-20 DIAGNOSIS — E1110 Type 2 diabetes mellitus with ketoacidosis without coma: Secondary | ICD-10-CM | POA: Insufficient documentation

## 2021-02-20 DIAGNOSIS — Z79899 Other long term (current) drug therapy: Secondary | ICD-10-CM | POA: Insufficient documentation

## 2021-02-20 DIAGNOSIS — F10129 Alcohol abuse with intoxication, unspecified: Secondary | ICD-10-CM | POA: Insufficient documentation

## 2021-02-20 DIAGNOSIS — Z87891 Personal history of nicotine dependence: Secondary | ICD-10-CM | POA: Insufficient documentation

## 2021-02-20 DIAGNOSIS — F10920 Alcohol use, unspecified with intoxication, uncomplicated: Secondary | ICD-10-CM

## 2021-02-20 DIAGNOSIS — R1013 Epigastric pain: Secondary | ICD-10-CM | POA: Insufficient documentation

## 2021-02-20 DIAGNOSIS — Y907 Blood alcohol level of 200-239 mg/100 ml: Secondary | ICD-10-CM | POA: Diagnosis not present

## 2021-02-20 DIAGNOSIS — Z91040 Latex allergy status: Secondary | ICD-10-CM | POA: Insufficient documentation

## 2021-02-20 DIAGNOSIS — R079 Chest pain, unspecified: Secondary | ICD-10-CM | POA: Diagnosis present

## 2021-02-20 DIAGNOSIS — N183 Chronic kidney disease, stage 3 unspecified: Secondary | ICD-10-CM | POA: Diagnosis not present

## 2021-02-20 DIAGNOSIS — R000 Tachycardia, unspecified: Secondary | ICD-10-CM | POA: Insufficient documentation

## 2021-02-20 DIAGNOSIS — Z7984 Long term (current) use of oral hypoglycemic drugs: Secondary | ICD-10-CM | POA: Insufficient documentation

## 2021-02-20 LAB — URINALYSIS, DIPSTICK ONLY
Bilirubin Urine: NEGATIVE
Glucose, UA: >=500 mg/dL — AB
Hgb urine dipstick: NEGATIVE
Ketones, ur: 5 mg/dL — AB
Leukocytes,Ua: NEGATIVE
Nitrite: NEGATIVE
Protein, ur: 30 mg/dL — AB
Specific Gravity, Urine: 1.038 — ABNORMAL HIGH (ref 1.005–1.030)
pH: 5 (ref 5.0–8.0)

## 2021-02-20 LAB — COMPREHENSIVE METABOLIC PANEL
ALT: 42 U/L (ref 0–44)
AST: 53 U/L — ABNORMAL HIGH (ref 15–41)
Albumin: 4 g/dL (ref 3.5–5.0)
Alkaline Phosphatase: 86 U/L (ref 38–126)
Anion gap: 12 (ref 5–15)
BUN: 16 mg/dL (ref 6–20)
CO2: 25 mmol/L (ref 22–32)
Calcium: 9.1 mg/dL (ref 8.9–10.3)
Chloride: 105 mmol/L (ref 98–111)
Creatinine, Ser: 1.05 mg/dL (ref 0.61–1.24)
GFR, Estimated: >60 mL/min (ref >60)
Glucose, Bld: 198 mg/dL — ABNORMAL HIGH (ref 70–99)
Potassium: 4.4 mmol/L (ref 3.5–5.1)
Sodium: 142 mmol/L (ref 135–145)
Total Bilirubin: 0.8 mg/dL (ref 0.3–1.2)
Total Protein: 6.8 g/dL (ref 6.5–8.1)

## 2021-02-20 LAB — CBC
HCT: 43.6 % (ref 39.0–52.0)
Hemoglobin: 14.5 g/dL (ref 13.0–17.0)
MCH: 28.9 pg (ref 26.0–34.0)
MCHC: 33.3 g/dL (ref 30.0–36.0)
MCV: 86.9 fL (ref 80.0–100.0)
Platelets: 168 10*3/uL (ref 150–400)
RBC: 5.02 MIL/uL (ref 4.22–5.81)
RDW: 15.8 % — ABNORMAL HIGH (ref 11.5–15.5)
WBC: 10.3 10*3/uL (ref 4.0–10.5)
nRBC: 0 % (ref 0.0–0.2)

## 2021-02-20 LAB — LIPASE, BLOOD: Lipase: 39 U/L (ref 11–51)

## 2021-02-20 LAB — D-DIMER, QUANTITATIVE: D-Dimer, Quant: 0.69 ug/mL-FEU — ABNORMAL HIGH (ref 0.00–0.50)

## 2021-02-20 LAB — TROPONIN I (HIGH SENSITIVITY)
Troponin I (High Sensitivity): 5 ng/L (ref <18)
Troponin I (High Sensitivity): 5 ng/L (ref <18)

## 2021-02-20 LAB — ETHANOL: Alcohol, Ethyl (B): 203 mg/dL — ABNORMAL HIGH (ref <10)

## 2021-02-20 LAB — CBG MONITORING, ED: Glucose-Capillary: 192 mg/dL — ABNORMAL HIGH (ref 70–99)

## 2021-02-20 LAB — AMMONIA: Ammonia: <10 umol/L (ref 9–35)

## 2021-02-20 MED ORDER — MORPHINE SULFATE (PF) 4 MG/ML IV SOLN
4.0000 mg | Freq: Once | INTRAVENOUS | Status: AC
Start: 2021-02-20 — End: 2021-02-20
  Administered 2021-02-20: 4 mg via INTRAVENOUS
  Filled 2021-02-20: qty 1

## 2021-02-20 MED ORDER — IOHEXOL 350 MG/ML SOLN
80.0000 mL | Freq: Once | INTRAVENOUS | Status: AC | PRN
  Administered 2021-02-20: 80 mL via INTRAVENOUS

## 2021-02-20 MED ORDER — ONDANSETRON HCL 4 MG/2ML IJ SOLN
4.0000 mg | Freq: Once | INTRAMUSCULAR | Status: AC
  Administered 2021-02-20: 4 mg via INTRAVENOUS
  Filled 2021-02-20: qty 2

## 2021-02-20 MED ORDER — SODIUM CHLORIDE 0.9 % IV BOLUS
500.0000 mL | Freq: Once | INTRAVENOUS | Status: AC
  Administered 2021-02-20: 500 mL via INTRAVENOUS

## 2021-02-20 MED ORDER — OXYCODONE-ACETAMINOPHEN 5-325 MG PO TABS
1.0000 | ORAL_TABLET | Freq: Once | ORAL | Status: AC
Start: 2021-02-20 — End: 2021-02-20
  Administered 2021-02-20: 1 via ORAL
  Filled 2021-02-20: qty 1

## 2021-02-20 MED ORDER — SODIUM CHLORIDE 0.9 % IV SOLN: INTRAVENOUS | Status: DC

## 2021-02-20 MED ORDER — ASPIRIN 81 MG PO CHEW
324.0000 mg | CHEWABLE_TABLET | Freq: Once | ORAL | Status: AC
  Administered 2021-02-20: 324 mg via ORAL
  Filled 2021-02-20: qty 4

## 2021-02-20 MED ORDER — PANTOPRAZOLE SODIUM 40 MG IV SOLR
40.0000 mg | Freq: Once | INTRAVENOUS | Status: AC
  Administered 2021-02-20: 40 mg via INTRAVENOUS
  Filled 2021-02-20: qty 40

## 2021-02-20 NOTE — ED Notes (Signed)
Biotronik tech paged for stat interrogation

## 2021-02-20 NOTE — ED Notes (Signed)
Patient transported to CT 

## 2021-02-20 NOTE — Discharge Instructions (Signed)
Everything with your device looks normal today.  You do not have any normal abnormal heart rhythms and there is no evidence of you having a heart attack.  There is no sign of blood clots.  You can take Tylenol as needed for the pain.  If you start noticing a lot of swelling or bruising around your device box you should call your cardiologist.  If you start passing out or having severe shortness of breath you should return to the emergency room.

## 2021-02-20 NOTE — ED Provider Notes (Signed)
[facility] EMERGENCY DEPARTMENT Provider Note   CSN: [id number] Arrival date & time: 02/20/21  1105     History Chief Complaint  Patient presents with   AICD Problem    Connor Vaughn is a 60 y.o. male.  HPI  Patient presents to the ED with complaints of chest pain.  Patient states that started this morning.  He has had constant pain that waxes and wanes in the left side of his chest.  Feels the pain going up and down his arms as well as his leg.  It is a sharp pain as well as a pressure.  At times it is more intense and patient wondered if his AICD went off.  He has had some increased stress and anxiety recently.  He also had an episode of nausea and vomiting this morning.  States he is not having any abdominal pain.  He denies any diarrhea.  No fevers or chills.  Patient last drank alcohol a couple days ago.  Past Medical History:  Diagnosis Date   Cirrhosis (HCC)    Diabetes mellitus, type II (HCC)    DKA (diabetic ketoacidoses) 04/04/2019   H/O fracture    nasal x 2   Sleep apnea    UGIB (upper gastrointestinal bleed) 12/2019    Patient Active Problem List   Diagnosis Date Noted   Hypertrophic cardiomyopathy (HCC) 04/22/2020   NSVT (nonsustained ventricular tachycardia) 04/22/2020   Hematemesis 01/13/2020   Acute on chronic renal insufficiency 01/13/2020   CKD (chronic kidney disease), stage III (HCC) 01/13/2020   DM (diabetes mellitus), type 2 with complications (HCC) 01/13/2020   Neurogenic claudication due to lumbar spinal stenosis 12/15/2019   AKI (acute kidney injury) (HCC) 12/12/2019   DKA (diabetic ketoacidoses) 04/04/2019   Acute blood loss anemia 04/04/2019   Pancreatitis 04/04/2019   Lactic acidosis 04/04/2019   Encounter for central line placement    Sepsis with acute renal failure without septic shock (HCC)    Upper GI bleed    Recurrent right pleural effusion 01/10/2016   Alcoholic cirrhosis of liver with ascites (HCC)     Pleural effusion 12/24/2015   Pleural effusion, right 12/24/2015   Alcohol abuse 12/24/2015   Syncope 12/24/2015    Past Surgical History:  Procedure Laterality Date   ESOPHAGEAL BANDING  01/14/2020   Procedure: ESOPHAGEAL VARICES BANDING;  Surgeon: Jeani Hawking, MD;  Location: [facility];  Service: Endoscopy;;   ESOPHAGOGASTRODUODENOSCOPY N/A 01/13/2020   Procedure: ESOPHAGOGASTRODUODENOSCOPY (EGD);  Surgeon: Charna Elizabeth, MD;  Location: [facility];  Service: Endoscopy;  Laterality: N/A;   ESOPHAGOGASTRODUODENOSCOPY (EGD) WITH PROPOFOL N/A 04/05/2019   Procedure: ESOPHAGOGASTRODUODENOSCOPY (EGD) WITH PROPOFOL;  Surgeon: Jeani Hawking, MD;  Location: [facility];  Service: Endoscopy;  Laterality: N/A;   ESOPHAGOGASTRODUODENOSCOPY (EGD) WITH PROPOFOL N/A 01/14/2020   Procedure: ESOPHAGOGASTRODUODENOSCOPY (EGD) WITH PROPOFOL;  Surgeon: Jeani Hawking, MD;  Location: [facility];  Service: Endoscopy;  Laterality: N/A;   HERNIA REPAIR     Age 89   ICD IMPLANT N/A 05/18/2020   Procedure: ICD IMPLANT;  Surgeon: Marinus Maw, MD;  Location: [facility] INVASIVE CV LAB;  Service: Cardiovascular;  Laterality: N/A;   LUMBAR LAMINECTOMY/DECOMPRESSION MICRODISCECTOMY N/A 12/15/2019   Procedure: LUMBAR THREE-SACRAL ONE LAMINECTOMY WITH LUMBAR FOUR-FIVE DISCECTOMY ;  Surgeon: Bethann Goo, DO;  Location: [facility] OR;  Service: Neurosurgery;  Laterality: N/A;   thoracocenteis         Family History  Problem Relation Age of Onset   Diabetes Mother  Heart failure Mother    Valvular heart disease Mother    Diabetes Mellitus II Father    Heart failure Father    Dementia Father    Seizures Neg Hx     Social History   Tobacco Use   Smoking status: Former    Types: Cigarettes    Quit date: 2010    Years since quitting: 12.7   Smokeless tobacco: Never  Vaping Use   Vaping Use: Never used  Substance Use Topics   Alcohol use: Yes    Comment: "pint every few days"   Drug use: No    Home  Medications Prior to Admission medications   Medication Sig Start Date End Date Taking? Authorizing Provider  carvedilol (COREG) 12.5 MG tablet Take 1.5 tablets (18.75 mg total) by mouth 2 (two) times daily. 12/29/20  Yes Graciella Freer, PA-C  busPIRone (BUSPAR) 10 MG tablet Take 10 mg by mouth 3 (three) times daily. 11/28/19   [provider]  CYMBALTA 60 MG capsule Take 60 mg by mouth daily. 11/28/19   [provider]  FEROSUL 325 (65 Fe) MG tablet Take 325 mg by mouth daily. 03/06/20   [provider]  furosemide (LASIX) 40 MG tablet Take 1 tablet (40 mg total) by mouth daily. 12/17/19   Leroy Sea, MD  gabapentin (NEURONTIN) 300 MG capsule Take 1 capsule (300 mg total) by mouth 2 (two) times daily. 12/17/19   Leroy Sea, MD  glipiZIDE (GLUCOTROL) 10 MG tablet Take 10 mg by mouth 2 (two) times daily. 11/28/19   [provider]  hydrOXYzine (ATARAX/VISTARIL) 25 MG tablet Take 50 mg by mouth 2 (two) times daily as needed for itching. 04/01/20   [provider]  methocarbamol (ROBAXIN) 500 MG tablet Take 1 tablet (500 mg total) by mouth every 8 (eight) hours as needed for muscle spasms. 01/18/20   Rhetta Mura, MD  spironolactone (ALDACTONE) 25 MG tablet Take 1 tablet (25 mg total) by mouth daily. 12/17/19   Leroy Sea, MD  traMADol (ULTRAM) 50 MG tablet Take 50 mg by mouth as needed. 07/17/20   [provider]    Allergies    Latex  Review of Systems   Review of Systems  Neurological:        Patient will complains of some numbness and weakness in his left leg  All other systems reviewed and are negative.  Physical Exam Updated Vital Signs BP 136/85   Pulse (!) 102   Temp 98.5 F (36.9 C) (Oral)   Resp 14   Ht 1.676 m (5\' 6" )   Wt 95.3 kg   SpO2 95%   BMI 33.89 kg/m   Physical Exam Vitals and nursing note reviewed.  Constitutional:      General: He is not in acute distress.    Appearance: He is  well-developed.  HENT:     Head: Normocephalic and atraumatic.     Right Ear: External ear normal.     Left Ear: External ear normal.  Eyes:     General: No scleral icterus.       Right eye: No discharge.        Left eye: No discharge.     Conjunctiva/sclera: Conjunctivae normal.  Neck:     Trachea: No tracheal deviation.  Cardiovascular:     Rate and Rhythm: Regular rhythm. Tachycardia present.  Pulmonary:     Effort: Pulmonary effort is normal. No respiratory distress.     Breath  sounds: Normal breath sounds. No stridor. No wheezing or rales.  Abdominal:     General: Bowel sounds are normal. There is no distension.     Palpations: Abdomen is soft.     Tenderness: There is abdominal tenderness. There is no guarding or rebound.     Comments: Mild tenderness epigastric region  Musculoskeletal:        General: No tenderness or deformity.     Cervical back: Neck supple.  Skin:    General: Skin is warm and dry.     Findings: No rash.  Neurological:     General: No focal deficit present.     Mental Status: He is alert.     Cranial Nerves: No cranial nerve deficit (no facial droop, extraocular movements intact, no slurred speech).     Sensory: No sensory deficit.     Motor: No abnormal muscle tone or seizure activity.     Coordination: Coordination normal.     Comments: More difficulty raising the left leg off the bed, 5 out of 5 plantar flexion and dorsiflexion bilaterally, normal sensation  Psychiatric:        Mood and Affect: Mood normal.    ED Results / Procedures / Treatments   Labs (all labs ordered are listed, but only abnormal results are displayed) Labs Reviewed  CBC - Abnormal; Notable for the following components:      Result Value   RDW 15.8 (*)    All other components within normal limits  COMPREHENSIVE METABOLIC PANEL - Abnormal; Notable for the following components:   Glucose, Bld 198 (*)    AST 53 (*)    All other components within normal limits  ETHANOL  - Abnormal; Notable for the following components:   Alcohol, Ethyl (B) 203 (*)    All other components within normal limits  D-DIMER, QUANTITATIVE - Abnormal; Notable for the following components:   D-Dimer, Quant 0.69 (*)    All other components within normal limits  CBG MONITORING, ED - Abnormal; Notable for the following components:   Glucose-Capillary 192 (*)    All other components within normal limits  LIPASE, BLOOD  AMMONIA  URINALYSIS, DIPSTICK ONLY  TROPONIN I (HIGH SENSITIVITY)  TROPONIN I (HIGH SENSITIVITY)    EKG EKG Interpretation  Date/Time:  Saturday February 20 2021 11:06:56 EDT Ventricular Rate:  107 PR Interval:  181 QRS Duration: 99 QT Interval:  361 QTC Calculation: 482 R Axis:   -51 Text Interpretation: Sinus tachycardia LAD, consider left anterior fascicular block Abnormal R-wave progression, early transition Borderline prolonged QT interval Since last tracing rate faster Confirmed by Linwood Dibbles [phone]) on 02/20/2021 11:30:51 AM  Radiology DG Chest Portable 1 View  Result Date: 02/20/2021 CLINICAL DATA:  Chest pain and back pain EXAM: PORTABLE CHEST 1 VIEW COMPARISON:  Chest radiograph dated 05/18/2020. FINDINGS: The heart size and mediastinal contours are within normal limits. Both lungs are clear. Degenerative changes are seen in the spine. A left subclavian approach device is redemonstrated. IMPRESSION: No active disease. Electronically Signed   By: Romona Curls M.D.   On: 02/20/2021 12:44    Procedures Procedures   Medications Ordered in ED Medications  0.9 %  sodium chloride infusion ( Intravenous New Bag/Given 02/20/21 1154)  pantoprazole (PROTONIX) injection 40 mg (has no administration in time range)  aspirin chewable tablet 324 mg (324 mg Oral Given 02/20/21 1158)  morphine 4 MG/ML injection 4 mg (4 mg Intravenous Given 02/20/21 1158)  ondansetron (ZOFRAN) injection 4 mg (  4 mg Intravenous Given 02/20/21 1158)    ED Course  I have reviewed the  triage vital signs and the nursing notes.  Pertinent labs & imaging results that were available during my care of the patient were reviewed by me and considered in my medical decision making (see chart for details).  Clinical Course as of 02/20/21 1602  Sat Feb 20, 2021  1555 Serial troponins are normal.  Ammonia is normal. [JK]  1555 CBC metabolic panel unremarkable [JK]  1555 D-dimer slightly elevated  [JK]  1555 Chest x-ray without acute findings [JK]    Clinical Course User Index [JK] Linwood Dibbles, MD   MDM Rules/Calculators/A&P                           Patient presented to ED with complaints of chest pain.  Patient initially thought his AICD had gone off.  His device was interrogated however and it did not fire.  Patient's labs were notable for elevated D-dimer as well as an alcohol level.  Patient continues to complain of pleuritic type chest pain.  We will proceed with CT angiogram to rule out PE.  If negative possible symptoms may be related to gastroesophageal reflux related to his alcohol use.  Return over to oncoming MD Final Clinical Impression(s) / ED Diagnoses Final diagnoses:  Chest pain, pleuritic  Alcoholic intoxication without complication [facility])    Rx / DC Orders ED Discharge Orders     None        Linwood Dibbles, MD 02/20/21 [phone]

## 2021-02-20 NOTE — ED Triage Notes (Addendum)
Pt arrives via EMS from home for c/I ICD firing multiple times since this am. Also c/o numbness in left arm and chest pressure.   Pt also states he son committed suicide 3 weeks ago. Pt endorses increased stress.

## 2021-02-20 NOTE — ED Provider Notes (Signed)
Patient's CTA of the chest is negative for PE or any other acute pathology.  He does have a 7 mm right posterior nodule which he is already aware of and has been following with his PCP.  On repeat evaluation he is still complaining of pain over his device pocket.  There is no erythema, hematoma or abnormality with his surgical scar.  He does complain of more pleuritic pain with moving his arm and when sitting up and taking deep breaths.  He was given a dose of pain control.  Troponins x2 are negative and low suspicion that this is ACS.   Gwyneth Sprout, MD 02/20/21 [phone]

## 2021-02-22 NOTE — Progress Notes (Signed)
Remote ICD transmission.   

## 2021-04-13 ENCOUNTER — Ambulatory Visit: Payer: Medicaid Other | Admitting: Physician Assistant

## 2021-05-03 ENCOUNTER — Ambulatory Visit (HOSPITAL_BASED_OUTPATIENT_CLINIC_OR_DEPARTMENT_OTHER): Payer: Medicaid Other | Admitting: Family

## 2021-05-11 DIAGNOSIS — I361 Nonrheumatic tricuspid (valve) insufficiency: Secondary | ICD-10-CM | POA: Diagnosis not present

## 2021-05-11 DIAGNOSIS — I350 Nonrheumatic aortic (valve) stenosis: Secondary | ICD-10-CM | POA: Diagnosis not present

## 2021-05-13 LAB — CUP PACEART REMOTE DEVICE CHECK
Date Time Interrogation Session: 20221222080744
Implantable Lead Implant Date: 20211227
Implantable Lead Location: 753860
Implantable Lead Model: 436909
Implantable Lead Serial Number: 81421424
Implantable Pulse Generator Implant Date: 20211227
Pulse Gen Model: 429522
Pulse Gen Serial Number: 84812005

## 2021-05-18 ENCOUNTER — Ambulatory Visit (INDEPENDENT_AMBULATORY_CARE_PROVIDER_SITE_OTHER): Payer: Medicaid Other

## 2021-05-18 DIAGNOSIS — I422 Other hypertrophic cardiomyopathy: Secondary | ICD-10-CM | POA: Diagnosis not present

## 2021-05-26 NOTE — Progress Notes (Signed)
Remote ICD transmission.   

## 2021-05-27 ENCOUNTER — Encounter (HOSPITAL_BASED_OUTPATIENT_CLINIC_OR_DEPARTMENT_OTHER): Payer: Self-pay

## 2021-05-30 NOTE — Progress Notes (Deleted)
Office Visit    Patient Name: Connor Vaughn Date of Encounter: 05/30/2021  PCP:  Care, [facility] Senior   [facility]  Cardiologist:  Little Ishikawa, MD  Advanced Practice Provider:  No care team member to display Electrophysiologist:  Lewayne Bunting, MD      Chief Complaint    Connor Vaughn is a 61 y.o. male with a hx of HCM s/p Biotronik single chamber ICD, HLD, syncope, NSVT, alcoholic cirrhosis with esophageal varices, HCV, CAD, HTN, diabetes presents today for follow up of HCM   Past Medical History    Past Medical History:  Diagnosis Date   Cirrhosis (HCC)    Diabetes mellitus, type II (HCC)    DKA (diabetic ketoacidoses) 04/04/2019   H/O fracture    nasal x 2   Sleep apnea    UGIB (upper gastrointestinal bleed) 12/2019   Past Surgical History:  Procedure Laterality Date   ESOPHAGEAL BANDING  01/14/2020   Procedure: ESOPHAGEAL VARICES BANDING;  Surgeon: Jeani Hawking, MD;  Location: [facility];  Service: Endoscopy;;   ESOPHAGOGASTRODUODENOSCOPY N/A 01/13/2020   Procedure: ESOPHAGOGASTRODUODENOSCOPY (EGD);  Surgeon: Charna Elizabeth, MD;  Location: [facility];  Service: Endoscopy;  Laterality: N/A;   ESOPHAGOGASTRODUODENOSCOPY (EGD) WITH PROPOFOL N/A 04/05/2019   Procedure: ESOPHAGOGASTRODUODENOSCOPY (EGD) WITH PROPOFOL;  Surgeon: Jeani Hawking, MD;  Location: [facility];  Service: Endoscopy;  Laterality: N/A;   ESOPHAGOGASTRODUODENOSCOPY (EGD) WITH PROPOFOL N/A 01/14/2020   Procedure: ESOPHAGOGASTRODUODENOSCOPY (EGD) WITH PROPOFOL;  Surgeon: Jeani Hawking, MD;  Location: [facility];  Service: Endoscopy;  Laterality: N/A;   HERNIA REPAIR     Age 9   ICD IMPLANT N/A 05/18/2020   Procedure: ICD IMPLANT;  Surgeon: Marinus Maw, MD;  Location: [facility] INVASIVE CV LAB;  Service: Cardiovascular;  Laterality: N/A;   LUMBAR LAMINECTOMY/DECOMPRESSION MICRODISCECTOMY N/A 12/15/2019   Procedure: LUMBAR THREE-SACRAL ONE LAMINECTOMY WITH  LUMBAR FOUR-FIVE DISCECTOMY ;  Surgeon: Bethann Goo, DO;  Location: [facility] OR;  Service: Neurosurgery;  Laterality: N/A;   thoracocenteis      Allergies  Allergies  Allergen Reactions   Latex Rash    History of Present Illness    Connor Vaughn is a 61 y.o. male with a hx of HCM s/p Biotronik single chamber ICD, HLD, syncope, NSVT, alcoholic cirrhosis with esophageal varices, HCV, CAD, HTN, diabetes last seen 12/2020.  Family history notable for grandfather with SCD at 16 yo, father died of MI at 69 yo, mother passed at 43 yo (was told she needed valve replacement and declined).   He was admitted 12/2019 following syncope and hematemesis. EGD with large esophageal varices s/p banding. Echo with normal LVEF, mild AS. ZIO 01/2020 with one 7 beat run NSVT. Cardiac MRI ordered due to difficult echo images. CMR 03/30/20 asymmetric hypertrophy cardiomyopathy, patchy LGE at RV insertion site with LGE representing 2% of total myocardial mass, normal biventricular size and function, bicuspid aortic valve with fusion of right an dleft cusps. Repeat echo 04/07/20 with normal biventricular function, bicuspid aortic valve with mild to moderate AS. He was referred to Dr. Ladona Ridgel and had ICD placed 05/18/20.   He has had episodes of syncope dating back to 2017. Typically with position changes. One episode 2019 while driving.   He was seen by Dr. Bjorn Pippin 10/2020 and due to exertional dyspnea underwent cardiac CTA showing mild nonobstructive 25-49% of the LCx with coronary calcium score of 144 placing him in the 78th percentile for age, sex, and race matched  control. Incidental finding of lung nodule recommended for noncontrast chest CT in 12 months. He was seen 12/2020 by EP noting episodes of syncope but no correlating events on ICD. He was recommended for EP follow up in 1 year. Echo 04/2021 at [facility] with normal LVEF and moderate aortic stenosis.  He presents today for follow up.  ***   EKGs/Labs/Other Studies Reviewed:   The following studies were reviewed today:  Echo 04/2021 at [facility]:  Conclusions 1. Known nonobstructive hypertrophic cardiomyopathy, bicuspic AV and AS with ICD with syncope and ventricular ectopy 2. Sinus rhythm. 3. There is normal global LV contractility 4. Overall LVSF is normal with EF 60-65% 5. Focal hypertrophy of basilar septum 66mm without SAM or outflow tract obstruction 6. the RV is mildly enlarged 7. The RVSF is normal 8. The RA is mildly enlarged 9. The aortic valve is bicuspid with mild calcification 1-. Moderate aortic stenosis with peak/mean pressure gradient of max PG] / [75mmHG mean PG}, the AV area by continuity equation is 0.79 cm square. NDI = 0.25.   EKG:  EKG is ordered today.  The ekg ordered today demonstrates ***  Recent Labs: 12/29/2020: Magnesium 2.0 02/20/2021: ALT 42; BUN 16; Creatinine, Ser 1.05; Hemoglobin 14.5; Platelets 168; Potassium 4.4; Sodium 142  Recent Lipid Panel No results found for: CHOL, TRIG, HDL, CHOLHDL, VLDL, LDLCALC, LDLDIRECT    Home Medications   No outpatient medications have been marked as taking for the 05/31/21 encounter (Appointment) with Alver Sorrow, NP.     Review of Systems      All other systems reviewed and are otherwise negative except as noted above.  Physical Exam    VS:  There were no vitals taken for this visit. , BMI There is no height or weight on file to calculate BMI.  Wt Readings from Last 3 Encounters:  02/20/21 210 lb (95.3 kg)  12/29/20 214 lb 9.6 oz (97.3 kg)  08/19/20 215 lb 6.4 oz (97.7 kg)     GEN: Well nourished, well developed, in no acute distress. HEENT: normal. Neck: Supple, no JVD, carotid bruits, or masses. Cardiac: ***RRR, no murmurs, rubs, or gallops. No clubbing, cyanosis, edema.  ***Radials/PT 2+ and equal bilaterally.  Respiratory:  ***Respirations regular and unlabored, clear to auscultation bilaterally. GI:  Soft, nontender, nondistended. MS: No deformity or atrophy. Skin: Warm and dry, no rash. Neuro:  Strength and sensation are intact. Psych: Normal affect.  Assessment & Plan    CAD - Cardiac CTA 10/2020 coronary calcium score 144 placing him in 78th percentile with mild CAD 25-49% calcified stenosis in LCx). ***  HCM -  CME 03/30/20 with asymmetric hypertrophy up to 37mm in basal septum consistentw ith hypertrophic cardiomyopathy. Ideally would avoid diuresis however has needed with cirrhosis.  S/p ICD - Follows with Dr. Ladona Ridgel. ***  Bicuspid aortic valve / Mild to moderate AS - ***  HTN -   HLD, LDL goal <70 -   Cirrhosis -   Recurrent syncope - Multifactorial orthostasis and NSVT. Now s/p ICD. ***  Disposition: Follow up {follow up:15908} with Little Ishikawa, MD or APP.  Signed, Alver Sorrow, NP 05/30/2021, 5:11 PM [facility]

## 2021-05-31 ENCOUNTER — Ambulatory Visit (HOSPITAL_BASED_OUTPATIENT_CLINIC_OR_DEPARTMENT_OTHER): Payer: Medicaid Other | Admitting: Family

## 2021-06-27 NOTE — Progress Notes (Signed)
Cardiology Office Note:    Date:  06/27/2021   ID:  Connor Vaughn, DOB 1961/03/05, MRN [id number]  PCP:  Care, [facility] Senior   [facility] Providers Cardiologist:  Little Ishikawa, MD Electrophysiologist:  Lewayne Bunting, MD      Referring MD: Care, Staywell Senior   Follow-up for hypertrophic cardiomyopathy  History of Present Illness:    Connor Vaughn is a 61 y.o. male with a hx of HTN, NSVT, pleural effusion, alcoholic cirrhosis of the liver with ascites, pancreatitis, upper GI bleed, DKA, AKI, CKD stage III, and neurogenic claudication of lumbar spine.  He was seen by Otilio Saber, PA-C 12/29/2020.  During that time he presented for a routine EP follow-up.  He continued to do well from a cardiac standpoint.  He did report several syncopal episodes that appeared to be panic attacks.  His device was interrogated and showed no episodes.  He denied chest pain, palpitations, dyspnea, PND, nausea and vomiting.  He denied weight gain and lower extremity swelling.  He had not had any ICD shocks.  He presents to the emergency department 02/20/2021 with complaints of chest discomfort.  He underwent CTA of his chest which was negative for PE and other acute pathology.  He was noted to have a stable 7 mm right posterior nodule on his lung.  He complained of pain over his device pocket.  There was no erythema, hematoma, or abnormality with his surgical scar.  It was felt that his pain was pleuritic in nature and was reproducible with arm movement, sitting up and taking deep breaths.  He was given a dose of pain medication, troponins were negative x2, low suspicion for ACS.  He was discharged in stable condition.  He presents to the clinic today for follow-up evaluation states he contracted COVID while right around Thanksgiving and was admitted to the hospital finally being discharged on December 24.  His breathing has returned to baseline.  He feels that he is deconditioned but  continues to increase his physical activity and has been working with physical therapy regularly.  His blood pressure is well controlled today at 118/80.  He does note occasional episodes of dizziness when he moves from a sitting to standing position.  He is moving more slowly which is helped with the symptoms.  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  We reviewed his hospitalization and EKG.  He expressed understanding.  I have asked him to continue to increase his physical activity as tolerated, follow a heart healthy low-sodium diet, and we will plan follow-up for 6 months.  Today he denies chest pain, shortness of breath, lower extremity edema, fatigue, palpitations, melena, hematuria, hemoptysis, diaphoresis, weakness, presyncope, syncope, orthopnea, and PND.   Past Medical History:  Diagnosis Date   Cirrhosis (HCC)    Diabetes mellitus, type II (HCC)    DKA (diabetic ketoacidoses) 04/04/2019   H/O fracture    nasal x 2   Sleep apnea    UGIB (upper gastrointestinal bleed) 12/2019    Past Surgical History:  Procedure Laterality Date   ESOPHAGEAL BANDING  01/14/2020   Procedure: ESOPHAGEAL VARICES BANDING;  Surgeon: Jeani Hawking, MD;  Location: [facility];  Service: Endoscopy;;   ESOPHAGOGASTRODUODENOSCOPY N/A 01/13/2020   Procedure: ESOPHAGOGASTRODUODENOSCOPY (EGD);  Surgeon: Charna Elizabeth, MD;  Location: [facility];  Service: Endoscopy;  Laterality: N/A;   ESOPHAGOGASTRODUODENOSCOPY (EGD) WITH PROPOFOL N/A 04/05/2019   Procedure: ESOPHAGOGASTRODUODENOSCOPY (EGD) WITH PROPOFOL;  Surgeon: Jeani Hawking, MD;  Location: [facility];  Service: Endoscopy;  Laterality: N/A;   ESOPHAGOGASTRODUODENOSCOPY (EGD) WITH PROPOFOL N/A 01/14/2020   Procedure: ESOPHAGOGASTRODUODENOSCOPY (EGD) WITH PROPOFOL;  Surgeon: Carol Ada, MD;  Location: [facility];  Service: Endoscopy;  Laterality: N/A;   HERNIA REPAIR     Age 13   ICD IMPLANT N/A 05/18/2020   Procedure:  ICD IMPLANT;  Surgeon: Evans Lance, MD;  Location: [facility] CV LAB;  Service: Cardiovascular;  Laterality: N/A;   LUMBAR LAMINECTOMY/DECOMPRESSION MICRODISCECTOMY N/A 12/15/2019   Procedure: LUMBAR THREE-SACRAL ONE LAMINECTOMY WITH LUMBAR FOUR-FIVE DISCECTOMY ;  Surgeon: Karsten Ro, DO;  Location: [facility];  Service: Neurosurgery;  Laterality: N/A;   thoracocenteis      Current Medications: No outpatient medications have been marked as taking for the 06/29/21 encounter (Appointment) with Deberah Pelton, NP.     Allergies:   Latex   Social History   Socioeconomic History   Marital status: Married    Spouse name: Not on file   Number of children: 2   Years of education: Not on file   Highest education level: High school graduate  Occupational History   Not on file  Tobacco Use   Smoking status: Former    Types: Cigarettes    Quit date: 2010    Years since quitting: 13.1   Smokeless tobacco: Never  Vaping Use   Vaping Use: Never used  Substance and Sexual Activity   Alcohol use: Yes    Comment: "pint every few days"   Drug use: No   Sexual activity: Yes  Other Topics Concern   Not on file  Social History Narrative   Lives at home with his wife   Right handed   No caffeine   Social Determinants of Radio broadcast assistant Strain: Not on file  Food Insecurity: Not on file  Transportation Needs: Not on file  Physical Activity: Not on file  Stress: Not on file  Social Connections: Not on file     Family History: The patient's family history includes Dementia in his father; Diabetes in his mother; Diabetes Mellitus II in his father; Heart failure in his father and mother; Valvular heart disease in his mother. There is no history of Seizures.  ROS:   Please see the history of present illness.     All other systems reviewed and are negative.   Risk Assessment/Calculations:           Physical Exam:    VS:  There were no vitals taken for this visit.     Wt Readings from Last 3 Encounters:  02/20/21 210 lb (95.3 kg)  12/29/20 214 lb 9.6 oz (97.3 kg)  08/19/20 215 lb 6.4 oz (97.7 kg)     GEN:  Well nourished, well developed in no acute distress HEENT: Normal NECK: No JVD; No carotid bruits LYMPHATICS: No lymphadenopathy CARDIAC: RRR, no murmurs, rubs, gallops RESPIRATORY:  Clear to auscultation without rales, wheezing or rhonchi  ABDOMEN: Soft, non-tender, non-distended MUSCULOSKELETAL:  No edema; No deformity  SKIN: Warm and dry NEUROLOGIC:  Alert and oriented x 3 PSYCHIATRIC:  Normal affect    EKGs/Labs/Other Studies Reviewed:    The following studies were reviewed today:  Echocardiogram 04/07/2020  IMPRESSIONS     1. Left ventricular ejection fraction, by estimation, is 60 to 65%. The  left ventricle has normal function. The left ventricle has no regional  wall motion abnormalities. There is mild concentric left ventricular  hypertrophy. Left ventricular diastolic  parameters are consistent with Grade I diastolic dysfunction (impaired  relaxation). The average left ventricular global longitudinal strain is  -20.7 %.   2. Right ventricular systolic function is normal. The right ventricular  size is normal. Tricuspid regurgitation signal is inadequate for assessing  PA pressure.   3. The mitral valve is normal in structure. No evidence of mitral valve  regurgitation.   4. The aortic valve is probably bicuspid. There is mild calcification of  the aortic valve. There is moderate thickening of the aortic valve. Aortic  valve regurgitation is not visualized. Mild to moderate aortic valve  stenosis.   Comparison(s): 03/06/15 EF 60-65%. EKG:  EKG is  ordered today.  The ekg ordered today demonstrates normal sinus rhythm inferior infarct undetermined age 18 bpm  Recent Labs: 12/29/2020: Magnesium 2.0 02/20/2021: ALT 42; BUN 16; Creatinine, Ser 1.05; Hemoglobin 14.5; Platelets 168; Potassium 4.4; Sodium 142  Recent Lipid  Panel No results found for: CHOL, TRIG, HDL, CHOLHDL, VLDL, LDLCALC, LDLDIRECT  ASSESSMENT & PLAN    Hypertrophic cardiomyopathy-denies increased lightheadedness, presyncope and syncopal episodes.  Euvolemic.  Weight stable.  Increasing physical activity and working with physical therapy.  Status post single-chamber ICD.  Echocardiogram 11/21 showed LVEF 60-65%.  Device remote interrogated 05/13/2021, histograms appropriate, leads and battery life stable, no changes were recommended. Continue carvedilol, furosemide, spironolactone Heart healthy low-sodium diet-salty 6 given Increase physical activity as tolerated  NSVT-denies recent episodes of accelerated or irregular heartbeat.  Last device interrogation showed appropriate histograms. Continue carvedilol, furosemide, spironolactone Increase physical activity as tolerated  Syncope-denies recent recurrent episodes.  Denies increased lightheadedness.  Continue to follow her EP and remote device checks.  CKD stage III-creatinine 1.05 on 02/20/2021 Follows with PCP   Disposition: Follow-up with Dr. Gardiner Rhyme or APP in 6 months.        Medication Adjustments/Labs and Tests Ordered: Current medicines are reviewed at length with the patient today.  Concerns regarding medicines are outlined above.  No orders of the defined types were placed in this encounter.  No orders of the defined types were placed in this encounter.   There are no Patient Instructions on file for this visit.   Signed, Deberah Pelton, NP  06/27/2021 12:49 PM      Notice: This dictation was prepared with Dragon dictation along with smaller phrase technology. Any transcriptional errors that result from this process are unintentional and may not be corrected upon review.  I spent 15 minutes examining this patient, reviewing medications, and using patient centered shared decision making involving her cardiac care.  Prior to her visit I spent greater than 20 minutes  reviewing her past medical history,  medications, and prior cardiac tests.

## 2021-06-29 ENCOUNTER — Other Ambulatory Visit: Payer: Self-pay

## 2021-06-29 ENCOUNTER — Ambulatory Visit (INDEPENDENT_AMBULATORY_CARE_PROVIDER_SITE_OTHER): Payer: Medicaid Other | Admitting: General Practice

## 2021-06-29 ENCOUNTER — Encounter (HOSPITAL_BASED_OUTPATIENT_CLINIC_OR_DEPARTMENT_OTHER): Payer: Self-pay | Admitting: General Practice

## 2021-06-29 VITALS — BP 118/80 | HR 95 | Ht 66.0 in | Wt 202.0 lb

## 2021-06-29 DIAGNOSIS — F1011 Alcohol abuse, in remission: Secondary | ICD-10-CM

## 2021-06-29 DIAGNOSIS — I422 Other hypertrophic cardiomyopathy: Secondary | ICD-10-CM | POA: Diagnosis not present

## 2021-06-29 DIAGNOSIS — I4729 Other ventricular tachycardia: Secondary | ICD-10-CM

## 2021-06-29 DIAGNOSIS — R55 Syncope and collapse: Secondary | ICD-10-CM

## 2021-06-29 DIAGNOSIS — N183 Chronic kidney disease, stage 3 unspecified: Secondary | ICD-10-CM | POA: Diagnosis not present

## 2021-06-29 NOTE — Patient Instructions (Signed)
Medication Instructions:  Your physician recommends that you continue on your current medications as directed. Please refer to the Current Medication list given to you today.  *If you need a refill on your cardiac medications before your next appointment, please call your pharmacy*   Follow-Up: At [facility], you and your health needs are our priority.  As part of our continuing mission to provide you with exceptional heart care, we have created designated Provider Care Teams.  These Care Teams include your primary Cardiologist (physician) and Advanced Practice Providers (APPs -  Physician Assistants and Nurse Practitioners) who all work together to provide you with the care you need, when you need it.  We recommend signing up for the patient portal called "MyChart".  Sign up information is provided on this After Visit Summary.  MyChart is used to connect with patients for Virtual Visits (Telemedicine).  Patients are able to view lab/test results, encounter notes, upcoming appointments, etc.  Non-urgent messages can be sent to your provider as well.   To learn more about what you can do with MyChart, go to [email or web address].    Your next appointment:   6 month(s)  The format for your next appointment:   In Person  Provider:   Dr. Bjorn Pippin of [facility] Fabian, NP  Other Instructions Edd Fabian, NP has recommended following a low sodium diet. Please review the information below. Thank you!

## 2021-08-16 ENCOUNTER — Ambulatory Visit (INDEPENDENT_AMBULATORY_CARE_PROVIDER_SITE_OTHER): Payer: Medicaid Other

## 2021-08-16 DIAGNOSIS — I422 Other hypertrophic cardiomyopathy: Secondary | ICD-10-CM | POA: Diagnosis not present

## 2021-08-17 LAB — CUP PACEART REMOTE DEVICE CHECK
Date Time Interrogation Session: 20230328084759
Implantable Lead Implant Date: 20211227
Implantable Lead Location: 753860
Implantable Lead Model: 436909
Implantable Lead Serial Number: 81421424
Implantable Pulse Generator Implant Date: 20211227
Pulse Gen Model: 429522
Pulse Gen Serial Number: 84812005

## 2021-08-25 NOTE — Progress Notes (Signed)
Remote ICD transmission.   

## 2021-10-14 ENCOUNTER — Other Ambulatory Visit: Payer: Self-pay | Admitting: Neurological Surgery

## 2021-10-14 ENCOUNTER — Other Ambulatory Visit (HOSPITAL_COMMUNITY): Payer: Self-pay | Admitting: Neurological Surgery

## 2021-10-14 DIAGNOSIS — M4306 Spondylolysis, lumbar region: Secondary | ICD-10-CM

## 2021-11-15 ENCOUNTER — Ambulatory Visit (INDEPENDENT_AMBULATORY_CARE_PROVIDER_SITE_OTHER): Payer: Medicaid Other

## 2021-11-15 DIAGNOSIS — R55 Syncope and collapse: Secondary | ICD-10-CM | POA: Diagnosis not present

## 2021-11-17 LAB — CUP PACEART REMOTE DEVICE CHECK
Date Time Interrogation Session: 20230627083922
Implantable Lead Implant Date: 20211227
Implantable Lead Location: 753860
Implantable Lead Model: 436909
Implantable Lead Serial Number: 81421424
Implantable Pulse Generator Implant Date: 20211227
Pulse Gen Model: 429522
Pulse Gen Serial Number: 84812005

## 2021-12-08 NOTE — Progress Notes (Signed)
Remote ICD transmission.   

## 2021-12-13 ENCOUNTER — Ambulatory Visit (HOSPITAL_COMMUNITY)
Admission: RE | Admit: 2021-12-13 | Discharge: 2021-12-13 | Disposition: A | Discharge status: Home / Self Care | Payer: Medicaid Other | Source: Ambulatory Visit | Attending: Neurological Surgery | Admitting: Neurological Surgery

## 2021-12-13 DIAGNOSIS — M4306 Spondylolysis, lumbar region: Secondary | ICD-10-CM | POA: Insufficient documentation

## 2021-12-13 NOTE — Progress Notes (Signed)
Patient here today at [facility] for MRI Lumbar scan wo contrast. Patient has Biotronik device. Brent-rep to program patient with auto-detection on.

## 2021-12-14 MED ORDER — PROPOFOL 10 MG/ML IV BOLUS
INTRAVENOUS | Status: AC
  Filled 2021-12-14: qty 20

## 2021-12-14 MED ORDER — MIDAZOLAM HCL 2 MG/2ML IJ SOLN
INTRAMUSCULAR | Status: AC
  Filled 2021-12-14: qty 2

## 2021-12-14 MED ORDER — FENTANYL CITRATE (PF) 250 MCG/5ML IJ SOLN
INTRAMUSCULAR | Status: AC
  Filled 2021-12-14: qty 5

## 2022-01-11 ENCOUNTER — Encounter: Payer: Medicaid Other | Admitting: Internal Medicine

## 2022-01-12 ENCOUNTER — Other Ambulatory Visit: Payer: Self-pay | Admitting: *Deleted

## 2022-01-12 DIAGNOSIS — R911 Solitary pulmonary nodule: Secondary | ICD-10-CM

## 2022-01-14 ENCOUNTER — Ambulatory Visit: Payer: Medicaid Other | Admitting: Cardiology

## 2022-02-03 IMAGING — CT CT ANGIO CHEST
2 of 6 series · 18 of 36 positions shown · IV contrast (omnipaque)
Comparison: Chest x-ray from same day. CT heart dated November 09, 2020.

CLINICAL DATA: Chest pressure.  ICD firing.  Elevated D-dimer.

EXAM:
CT ANGIOGRAPHY CHEST WITH CONTRAST
TECHNIQUE: Multidetector CT imaging of the chest was performed using the
standard protocol during bolus administration of intravenous
contrast. Multiplanar CT image reconstructions and MIPs were
obtained to evaluate the vascular anatomy.
CONTRAST:  80mL OMNIPAQUE IOHEXOL 350 MG/ML SOLN

[Series 7: pe thins · axial · 0.91mm/px · z∈[+987,+1239]mm · 17 of 402 slices shown]
[im 21/402  lung]
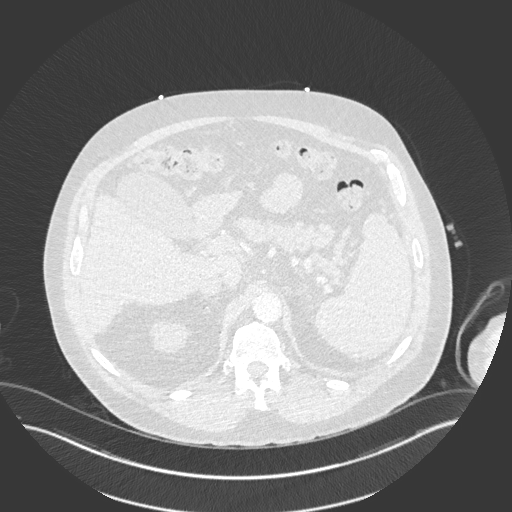
[im 41/402  mediastinal]
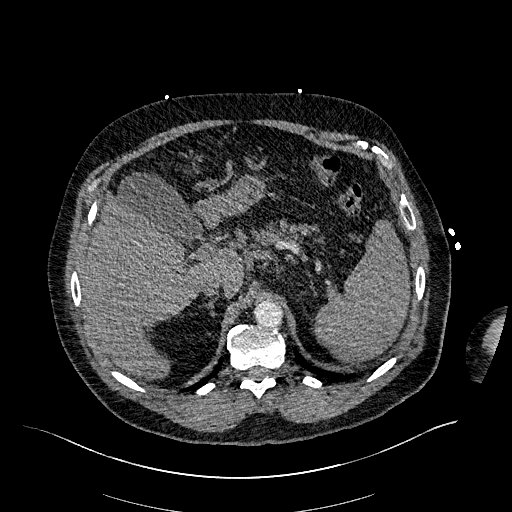
[im 61/402  lung]
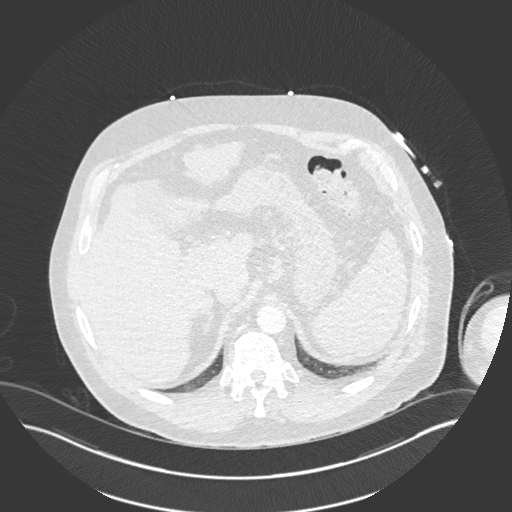
[im 81/402  mediastinal]
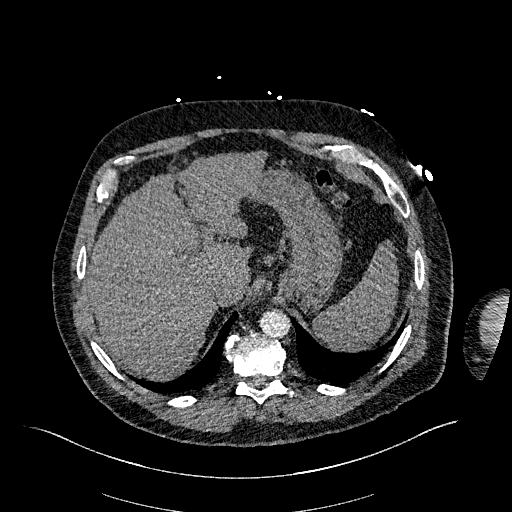
[im 121/402  lung]
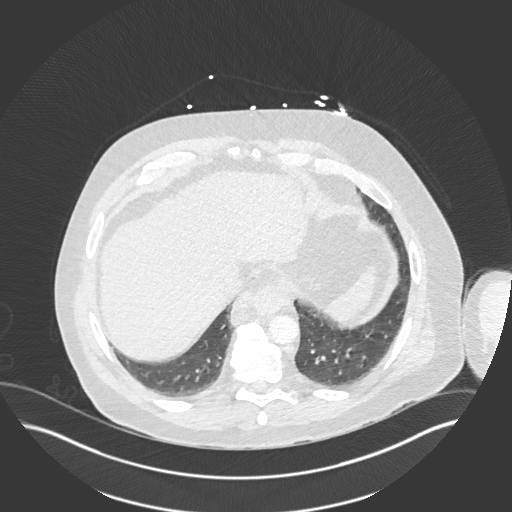
[im 141/402  mediastinal]
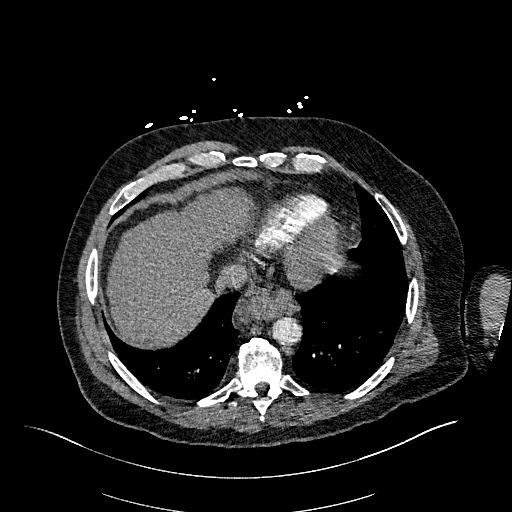
[im 161/402  lung]
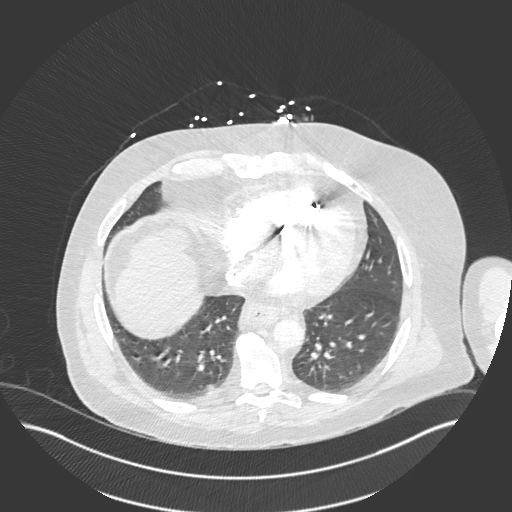
[im 181/402  mediastinal]
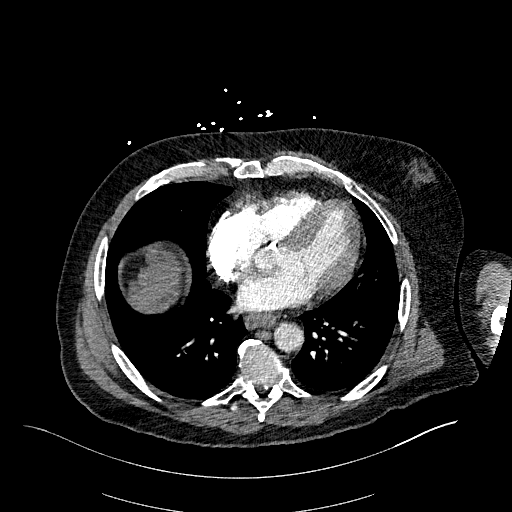
[im 201/402  lung]
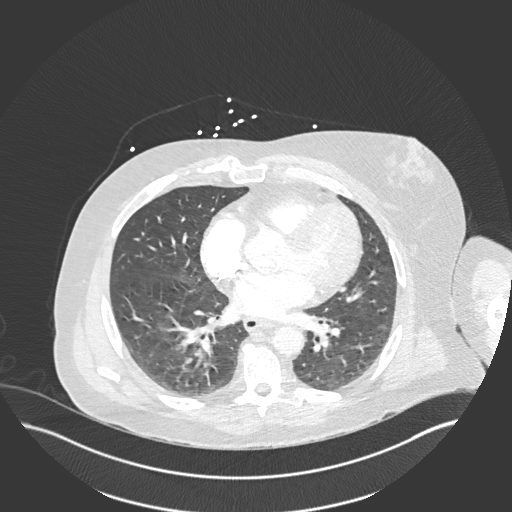
[im 221/402  mediastinal]
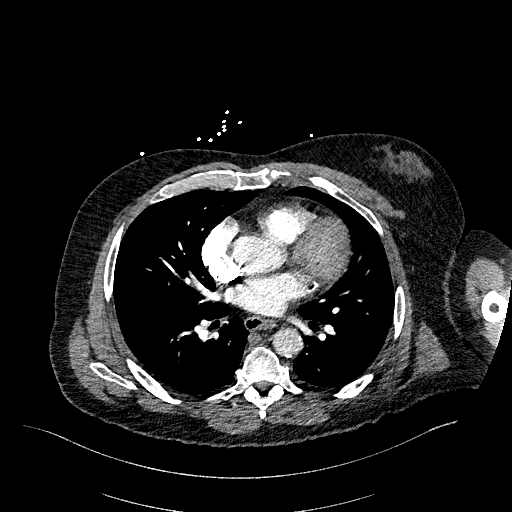
[im 241/402  lung]
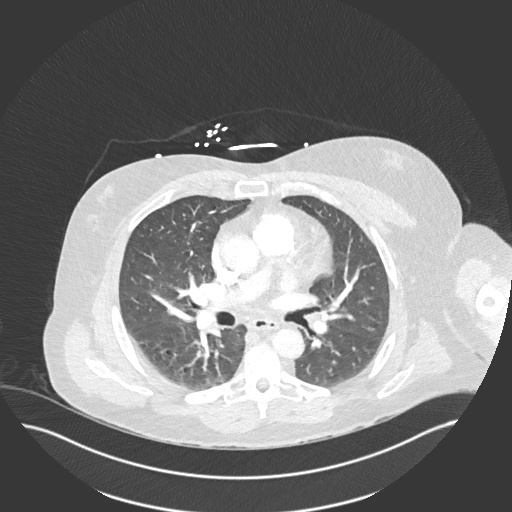
[im 261/402  mediastinal]
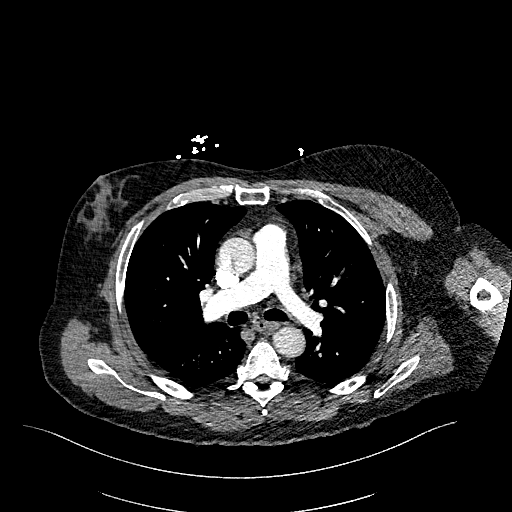
[im 281/402  lung]
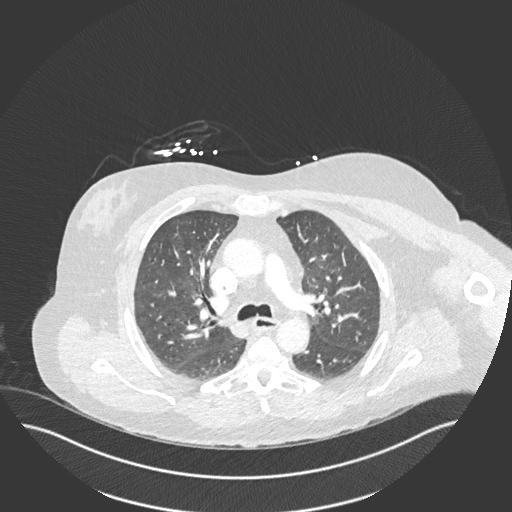
[im 321/402  mediastinal]
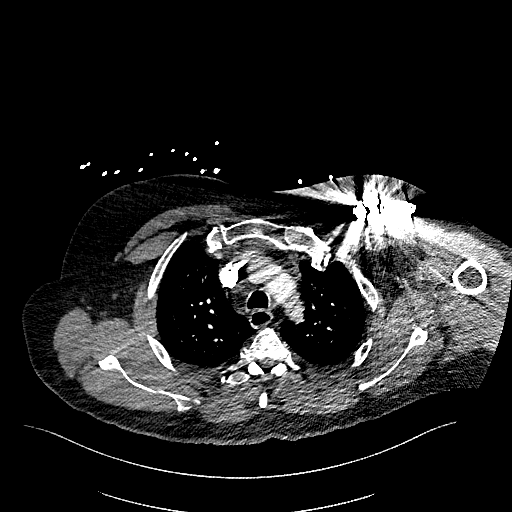
[im 341/402  lung]
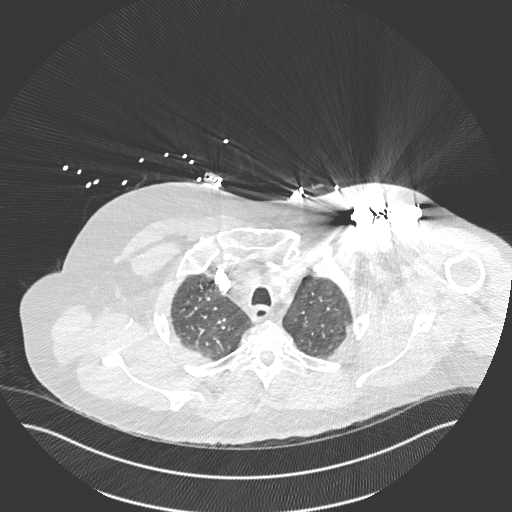
[im 361/402  mediastinal]
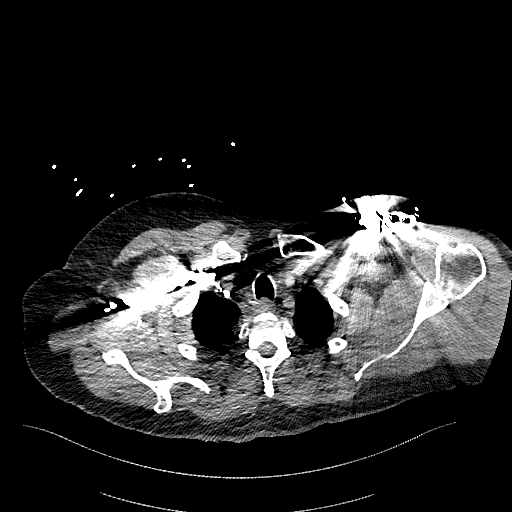
[im 381/402  lung]
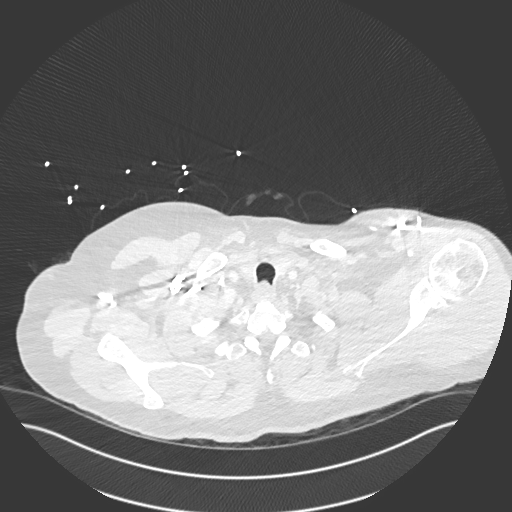

[Series 8: pe 2mm cor · coronal · 0.63mm/px · 1 of 179 slices shown]
[im 90/179  mediastinal]
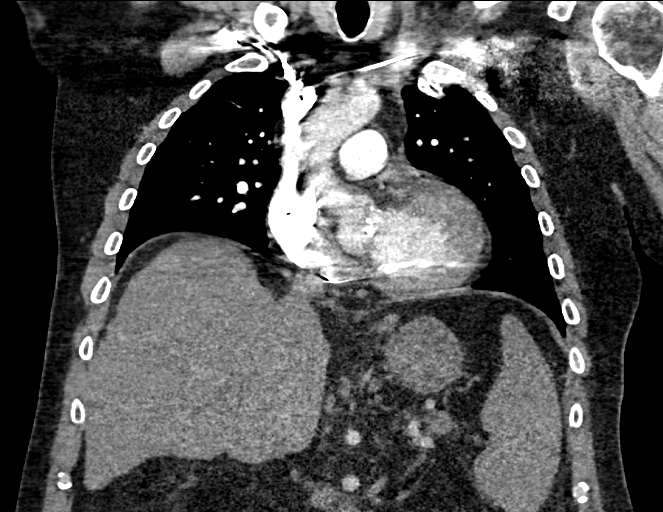

[18 of 36 positions shown; findings below may reference images not displayed]

FINDINGS: Cardiovascular: Satisfactory opacification of the pulmonary arteries
to the segmental level. No evidence of pulmonary embolism. The heart
is at the upper limits of normal in size. No pericardial effusion.
No thoracic aortic aneurysm or dissection. Coronary, aortic arch,
and branch vessel atherosclerotic vascular disease. Left chest wall
AICD.

Mediastinum/Nodes: No enlarged mediastinal, hilar, or axillary lymph
nodes. Thyroid gland, trachea, and esophagus demonstrate no
significant findings.

Lungs/Pleura: Unchanged 10 x 4 mm pulmonary nodule in the posterior
right lower lobe (series 6, image 89). No focal consolidation,
pleural effusion, or pneumothorax.

Upper Abdomen: No acute abnormality. Unchanged cirrhosis, mild
splenomegaly, and paraesophageal varices.

Musculoskeletal: Bilateral concha mass T0. No acute or significant
osseous findings.

Review of the MIP images confirms the above findings.
IMPRESSION: 1. No evidence of pulmonary embolism. No acute intrathoracic
process.
2. Unchanged 7 mm mean diameter pulmonary nodule in the posterior
right lower lobe. Follow up CT at 14-20 months (from today's scan)
is considered optional for low-risk patients, but is recommended for
high-risk patients. This recommendation follows the consensus
statement: Guidelines for Management of Incidental Pulmonary Nodules
Detected on CT Images: From the [HOSPITAL] 7299; Radiology
3. Unchanged cirrhosis and sequelae of portal hypertension.
4. Aortic Atherosclerosis (ZCSII-FHT.T).
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
## 2022-02-14 ENCOUNTER — Ambulatory Visit (INDEPENDENT_AMBULATORY_CARE_PROVIDER_SITE_OTHER): Payer: Medicaid Other

## 2022-02-14 DIAGNOSIS — I422 Other hypertrophic cardiomyopathy: Secondary | ICD-10-CM

## 2022-02-15 ENCOUNTER — Ambulatory Visit: Payer: Medicaid Other | Attending: Cardiology | Admitting: General Practice

## 2022-02-15 ENCOUNTER — Encounter: Payer: Self-pay | Admitting: General Practice

## 2022-02-15 VITALS — BP 130/88 | HR 90 | Ht 66.0 in | Wt 185.0 lb

## 2022-02-15 DIAGNOSIS — R55 Syncope and collapse: Secondary | ICD-10-CM

## 2022-02-15 DIAGNOSIS — N183 Chronic kidney disease, stage 3 unspecified: Secondary | ICD-10-CM | POA: Diagnosis not present

## 2022-02-15 DIAGNOSIS — I422 Other hypertrophic cardiomyopathy: Secondary | ICD-10-CM

## 2022-02-15 DIAGNOSIS — I4729 Other ventricular tachycardia: Secondary | ICD-10-CM

## 2022-02-15 LAB — CUP PACEART REMOTE DEVICE CHECK
Battery Voltage: 3.1 V
Brady Statistic RV Percent Paced: 0 %
Date Time Interrogation Session: 20230925120126
HighPow Impedance: 86 Ohm
Implantable Lead Implant Date: 20211227
Implantable Lead Location: 753860
Implantable Lead Model: 436909
Implantable Lead Serial Number: 81421424
Implantable Pulse Generator Implant Date: 20211227
Lead Channel Impedance Value: 677 Ohm
Lead Channel Pacing Threshold Amplitude: 0.8 V
Lead Channel Pacing Threshold Pulse Width: 0.4 ms
Lead Channel Sensing Intrinsic Amplitude: 8 mV
Lead Channel Sensing Intrinsic Amplitude: 9.5 mV
Lead Channel Setting Pacing Amplitude: 2.5 V
Lead Channel Setting Pacing Pulse Width: 0.4 ms
Lead Channel Setting Sensing Sensitivity: 0.8 mV
Pulse Gen Model: 429522
Pulse Gen Serial Number: 84812005

## 2022-02-15 NOTE — Patient Instructions (Signed)
Medication Instructions:  The current medical regimen is effective;  continue present plan and medications as directed. Please refer to the Current Medication list given to you today.  *If you need a refill on your cardiac medications before your next appointment, please call your pharmacy*   Lab Work: NONE  Other Instructions  MAINTAIN PHYSICAL ACTIVITY AS TOLERATED  PLEASE READ AND FOLLOW ATTACHED  SALTY 6   Follow-Up: At [facility], you and your health needs are our priority.  As part of our continuing mission to provide you with exceptional heart care, we have created designated Provider Care Teams.  These Care Teams include your primary Cardiologist (physician) and Advanced Practice Providers (APPs -  Physician Assistants and Nurse Practitioners) who all work together to provide you with the care you need, when you need it.  We recommend signing up for the patient portal called "MyChart".  Sign up information is provided on this After Visit Summary.  MyChart is used to connect with patients for Virtual Visits (Telemedicine).  Patients are able to view lab/test results, encounter notes, upcoming appointments, etc.  Non-urgent messages can be sent to your provider as well.   To learn more about what you can do with MyChart, go to [email or web address].    Your next appointment:   6-9 month(s)  The format for your next appointment:   In Person  Provider:   Donato Heinz, MD    Important Information About Sugar

## 2022-02-15 NOTE — Addendum Note (Signed)
Addended by: Hinton Dyer on: 02/15/2022 11:02 AM   Modules accepted: Orders

## 2022-03-04 NOTE — Progress Notes (Signed)
Remote ICD transmission.   

## 2022-03-22 ENCOUNTER — Ambulatory Visit: Payer: Medicaid Other | Attending: Internal Medicine | Admitting: Internal Medicine

## 2022-03-22 ENCOUNTER — Encounter: Payer: Self-pay | Admitting: Internal Medicine

## 2022-03-22 VITALS — BP 136/98 | HR 104 | Ht 66.0 in | Wt 187.2 lb

## 2022-03-22 DIAGNOSIS — I422 Other hypertrophic cardiomyopathy: Secondary | ICD-10-CM

## 2022-03-22 DIAGNOSIS — Z95810 Presence of automatic (implantable) cardiac defibrillator: Secondary | ICD-10-CM

## 2022-03-22 LAB — CUP PACEART INCLINIC DEVICE CHECK
Date Time Interrogation Session: 20231031103630
Implantable Lead Connection Status: 753985
Implantable Lead Implant Date: 20211227
Implantable Lead Location: 753860
Implantable Lead Model: 436909
Implantable Lead Serial Number: 81421424
Implantable Pulse Generator Implant Date: 20211227
Pulse Gen Model: 429522
Pulse Gen Serial Number: 84812005

## 2022-03-22 NOTE — Patient Instructions (Signed)

## 2022-03-22 NOTE — Progress Notes (Signed)
HPI [name] returns today for ongoing followup, s/p ICD insertion. He has a h/o HCM, NSVT and syncope. He also has a h/o hep c and cirrhosis. He has minimal sob or chest pain. He has DM. He has HTN. He also has a family h/o sudden death but no known prior diagnosis of HCM. He has had problems with cirrhosis and also has a h/o variceal bleeding with banding. In the interim he notes that he has not passed out. He would like to start back driving.  Allergies  Allergen Reactions   Latex Rash     Current Outpatient Medications  Medication Sig Dispense Refill   albuterol (VENTOLIN HFA) 108 (90 Base) MCG/ACT inhaler Inhale into the lungs every 6 (six) hours as needed for wheezing or shortness of breath.     busPIRone (BUSPAR) 15 MG tablet Take 15 mg by mouth 3 (three) times daily.     carvedilol (COREG) 12.5 MG tablet Take 1.5 tablets (18.75 mg total) by mouth 2 (two) times daily. 90 tablet 6   dapagliflozin propanediol (FARXIGA) 10 MG TABS tablet Take 10 mg by mouth daily.     DULoxetine (CYMBALTA) 30 MG capsule Take 30 mg by mouth daily.     DULoxetine (CYMBALTA) 60 MG capsule Take 60 mg by mouth daily.     ferrous sulfate 325 (65 FE) MG EC tablet Take 325 mg by mouth 3 (three) times daily with meals.     furosemide (LASIX) 20 MG tablet Take 20 mg by mouth daily.     gabapentin (NEURONTIN) 600 MG tablet Take 600 mg by mouth 3 (three) times daily.     glipiZIDE (GLUCOTROL) 10 MG tablet Take 10 mg by mouth 2 (two) times daily.     hydrOXYzine (VISTARIL) 25 MG capsule Take 25 mg by mouth 3 (three) times daily as needed.     ipratropium-albuterol (DUONEB) 0.5-2.5 (3) MG/3ML SOLN Take 3 mLs by nebulization.     lidocaine (LIDODERM) 5 % Place 1 patch onto the skin daily. Remove & Discard patch within 12 hours or as directed by MD     omeprazole (PRILOSEC) 20 MG capsule Take 20 mg by mouth daily.     ondansetron (ZOFRAN-ODT) 4 MG disintegrating tablet Take 4 mg by mouth every 8 (eight)  hours as needed for nausea or vomiting.     potassium chloride SA (KLOR-CON M) 20 MEQ tablet Take 20 mEq by mouth daily.     sitaGLIPtin (JANUVIA) 25 MG tablet Take 25 mg by mouth daily.     spironolactone (ALDACTONE) 25 MG tablet Take 1 tablet (25 mg total) by mouth daily. 30 tablet 0   No current facility-administered medications for this visit.     Past Medical History:  Diagnosis Date   Cirrhosis ([place])    Diabetes mellitus, type II ([place])    DKA (diabetic ketoacidoses) 04/04/2019   H/O fracture    nasal x 2   Sleep apnea    UGIB (upper gastrointestinal bleed) 12/2019    ROS:   All systems reviewed and negative except as noted in the HPI.   Past Surgical History:  Procedure Laterality Date   ESOPHAGEAL BANDING  01/14/2020   Procedure: ESOPHAGEAL VARICES BANDING;  Surgeon: Carol Ada, MD;  [facility];  Service: Endoscopy;;   ESOPHAGOGASTRODUODENOSCOPY N/A 01/13/2020   Procedure: ESOPHAGOGASTRODUODENOSCOPY (EGD);  Surgeon: Juanita Craver, MD;  Location: [facility];  Service: Endoscopy;  Laterality: N/A;   ESOPHAGOGASTRODUODENOSCOPY (EGD) WITH PROPOFOL N/A  04/05/2019   Procedure: ESOPHAGOGASTRODUODENOSCOPY (EGD) WITH PROPOFOL;  Surgeon: Jeani Hawking, MD;  [facility];  Service: Endoscopy;  Laterality: N/A;   ESOPHAGOGASTRODUODENOSCOPY (EGD) WITH PROPOFOL N/A 01/14/2020   Procedure: ESOPHAGOGASTRODUODENOSCOPY (EGD) WITH PROPOFOL;  Surgeon: Jeani Hawking, MD;  [facility];  Service: Endoscopy;  Laterality: N/A;   HERNIA REPAIR     Age 13   ICD IMPLANT N/A 05/18/2020   Procedure: ICD IMPLANT;  Surgeon: Marinus Maw, MD;  Location: [facility];  Service: Cardiovascular;  Laterality: N/A;   LUMBAR LAMINECTOMY/DECOMPRESSION MICRODISCECTOMY N/A 12/15/2019   Procedure: LUMBAR THREE-SACRAL ONE LAMINECTOMY WITH LUMBAR FOUR-FIVE DISCECTOMY ;  Surgeon: Bethann Goo, DO;  Location: [facility];  Service: Neurosurgery;  Laterality: N/A;    thoracocenteis       Family History  Problem Relation Age of Onset   Diabetes Mother    Heart failure Mother    Valvular heart disease Mother    Diabetes Mellitus II Father    Heart failure Father    Dementia Father    Seizures Neg Hx      Social History   Socioeconomic History   Marital status: Married    Spouse name: Not on file   Number of children: 2   Years of education: Not on file   Highest education level: High school graduate  Occupational History   Not on file  Tobacco Use   Smoking status: Former    Types: Cigarettes    Quit date: 2010    Years since quitting: 13.8   Smokeless tobacco: Never  Vaping Use   Vaping Use: Never used  Substance and Sexual Activity   Alcohol use: Yes    Comment: "pint every few days"   Drug use: No   Sexual activity: Yes  Other Topics Concern   Not on file  Social History Narrative   Lives at home with his wife   Right handed   No caffeine   Social Determinants of Corporate investment banker Strain: Not on file  Food Insecurity: Not on file  Transportation Needs: Not on file  Physical Activity: Not on file  Stress: Not on file  Social Connections: Not on file  Intimate Partner Violence: Not on file     BP (!) 136/98   Pulse (!) 104   Ht 5\' 6"  (1.676 m)   Wt 187 lb 3.2 oz (84.9 kg)   SpO2 98%   BMI 30.21 kg/m   Physical Exam:  Well appearing NAD HEENT: Unremarkable Neck:  No JVD, no thyromegally Lymphatics:  No adenopathy Back:  No CVA tenderness Lungs:  Clear with no wheezes; no egophony HEART:  Regular rate rhythm, no murmurs, no rubs, no clicks Abd:  soft, positive bowel sounds, no organomegally, no rebound, no guarding Ext:  2 plus pulses, no edema, no cyanosis, no clubbing Skin:  No rashes no nodules Neuro:  CN II through XII intact, motor grossly intact   DEVICE  Normal device function.  See PaceArt for details.   Assess/Plan:  1. HCM - he denies anginal symptoms or sob. He is encouraged to  maintain a low sodium diet. 2. NSVT - he has had this documented but unclear how symptomatic. No ICD therapies. 3. Syncope - no episodes in the last year. He may return to driving.  4. ICD - his Biotronik VDD device is working normally.    Ashtin Rosner,MD

## 2022-03-23 ENCOUNTER — Other Ambulatory Visit: Payer: Self-pay

## 2022-03-23 DIAGNOSIS — K7460 Unspecified cirrhosis of liver: Secondary | ICD-10-CM

## 2022-05-03 ENCOUNTER — Ambulatory Visit
Admission: RE | Admit: 2022-05-03 | Discharge: 2022-05-03 | Disposition: A | Discharge status: Home / Self Care | Payer: Medicaid Other | Source: Ambulatory Visit

## 2022-05-03 DIAGNOSIS — K7460 Unspecified cirrhosis of liver: Secondary | ICD-10-CM

## 2022-05-17 ENCOUNTER — Ambulatory Visit (INDEPENDENT_AMBULATORY_CARE_PROVIDER_SITE_OTHER): Payer: Medicaid Other

## 2022-05-17 DIAGNOSIS — I422 Other hypertrophic cardiomyopathy: Secondary | ICD-10-CM | POA: Diagnosis not present

## 2022-05-18 LAB — CUP PACEART REMOTE DEVICE CHECK
Date Time Interrogation Session: 20231224075841
Implantable Lead Connection Status: 753985
Implantable Lead Implant Date: 20211227
Implantable Lead Location: 753860
Implantable Lead Model: 436909
Implantable Lead Serial Number: 81421424
Implantable Pulse Generator Implant Date: 20211227
Pulse Gen Model: 429522
Pulse Gen Serial Number: 84812005

## 2022-06-13 NOTE — Progress Notes (Signed)
Remote ICD transmission.   
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
## 2022-08-15 ENCOUNTER — Ambulatory Visit (INDEPENDENT_AMBULATORY_CARE_PROVIDER_SITE_OTHER): Payer: Medicaid Other

## 2022-08-15 DIAGNOSIS — I4729 Other ventricular tachycardia: Secondary | ICD-10-CM | POA: Diagnosis not present

## 2022-08-15 LAB — CUP PACEART REMOTE DEVICE CHECK
Battery Voltage: 3.1 V
Brady Statistic RV Percent Paced: 0 %
Date Time Interrogation Session: 20240325112842
HighPow Impedance: 78 Ohm
Implantable Lead Connection Status: 753985
Implantable Lead Implant Date: 20211227
Implantable Lead Location: 753860
Implantable Lead Model: 436909
Implantable Lead Serial Number: 81421424
Implantable Pulse Generator Implant Date: 20211227
Lead Channel Impedance Value: 677 Ohm
Lead Channel Pacing Threshold Amplitude: 1 V
Lead Channel Pacing Threshold Pulse Width: 0.4 ms
Lead Channel Sensing Intrinsic Amplitude: 6.4 mV
Lead Channel Sensing Intrinsic Amplitude: 8.4 mV
Lead Channel Setting Pacing Amplitude: 2.5 V
Lead Channel Setting Pacing Pulse Width: 0.4 ms
Lead Channel Setting Sensing Sensitivity: 0.8 mV
Pulse Gen Model: 429522
Pulse Gen Serial Number: 84812005
Zone Setting Status: 755011

## 2022-08-16 ENCOUNTER — Ambulatory Visit: Payer: Medicaid Other | Admitting: Cardiology

## 2022-09-27 NOTE — Progress Notes (Signed)
Remote ICD transmission.   

## 2022-09-29 ENCOUNTER — Ambulatory Visit: Payer: Medicaid Other | Admitting: Nurse Practitioner
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
## 2022-10-19 ENCOUNTER — Ambulatory Visit: Payer: Medicaid Other | Admitting: Student

## 2022-11-02 ENCOUNTER — Ambulatory Visit: Payer: Medicaid Other | Attending: Cardiology | Admitting: Physician Assistant

## 2022-11-02 ENCOUNTER — Encounter: Payer: Self-pay | Admitting: Physician Assistant

## 2022-11-02 VITALS — BP 128/80 | HR 101 | Ht 66.0 in | Wt 167.8 lb

## 2022-11-02 DIAGNOSIS — K7031 Alcoholic cirrhosis of liver with ascites: Secondary | ICD-10-CM

## 2022-11-02 DIAGNOSIS — R55 Syncope and collapse: Secondary | ICD-10-CM | POA: Diagnosis not present

## 2022-11-02 DIAGNOSIS — I4729 Other ventricular tachycardia: Secondary | ICD-10-CM | POA: Diagnosis not present

## 2022-11-02 DIAGNOSIS — J90 Pleural effusion, not elsewhere classified: Secondary | ICD-10-CM | POA: Diagnosis not present

## 2022-11-02 NOTE — Progress Notes (Signed)
Cardiology Office Note:  .   Date:  11/02/2022  ID:  Connor Vaughn, DOB 06-04-1960, MRN [id number] PCP: [facility], [facility]  [facility] Providers Cardiologist:  Connor Ishikawa, MD Electrophysiologist:  Connor Bunting, MD     History of Present Illness: .   Connor Vaughn is a 62 y.o. 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  He had a history of syncope that appears to be panic attacks.  Device interrogation showed no significant arrhythmia to explain the syncope.  Most recent echocardiogram obtained on 04/07/2020 showed EF 60 to 65%, grade 1 DD, normal RV, no significant valve issue.  CT of the chest in October 2022 was negative for PE however did noted 7 mm right posterior nodule in his lung.  He required thoracentesis at [facility] [place] in September 2023.  His last visit was with Connor Vaughn on 02/15/2022 at which time he was doing okay.  Last device interrogation performed in March 2024 showed normal device function.  Patient presents today for 24-month follow-up.  Since the last visit, he says he has had 9 thoracentesis on the right side.  He does not seem to accumulate pleural effusion on the left side, only on the right side.  On physical exam today, he still has a moderate-sized right pleural effusion as there was significantly decreased breath sound in the lower one third of the right lung.  Recently, he also had worsening abdominal distention and was told he has ascites seen on imaging.  He has a bulge in the right inguinal area compatible with inguinal hernia.  Since last Wednesday, his PCP at [facility] in [place] has increased his Lasix from the previous 20 mg daily to 40 mg twice a day due to ascites and pleural effusion.  On  physical exam, he has no lower extremity edema.  He denies any chest pain.  He get tired after walking a certain distance.  I will obtain basic metabolic panel, BNP and echocardiogram.  My suspicion is that recurrent pleural effusion and ascites is not due to a cardiac cause but more related to his cirrhosis.  He can follow-up in 6 months.  If creatinine significantly increased, he may need to scale back on the diuretic.  Unfortunately if still ascites and pleural effusions truly related to cirrhosis, there is no good way of preventing it from recurring.  Pulmonology service can potentially consider pleurodesis as a way to prevent pleural effusion from recurring.  ROS:   Patient does get short of breath and fatigue.  He denies chest pain, palpitations, pnd, orthopnea, n, v, dizziness, syncope, edema, weight gain, or early satiety. All other systems reviewed and are otherwise negative except as noted above.    Studies Reviewed: Marland Kitchen    EKG: Normal sinus rhythm, heart rate borderline fast at 101 bpm.  Possible Q waves in the inferior lead  Cardiac Studies & Procedures       ECHOCARDIOGRAM  ECHOCARDIOGRAM COMPLETE 04/07/2020  Narrative ECHOCARDIOGRAM REPORT    Patient Name:   Connor Vaughn Date of Exam: 04/07/2020 Medical Rec #:  [id number]          Height:       66.0 in Accession #:    [id number]         Weight:  213.8 lb Date of Birth:  24-Apr-1961          BSA:          2.057 m Patient Age:    59 years           BP:           126/87 mmHg Patient Gender: M                  HR:           62 bpm. Exam Location:  [address]  Procedure: 2D Echo, 3D Echo, Cardiac Doppler, Color Doppler and Strain Analysis  Indications:    I35.9 Aortic valve disease  History:        Patient has prior history of Echocardiogram examinations, most recent 03/06/2015. Pulmonary HTN; Risk Factors:Hypertension, Sleep Apnea and Obesity. Anemia.  Sonographer:    Connor Vaughn, RDCS Referring Phys:  [id number] Connor Vaughn  IMPRESSIONS   1. Left ventricular ejection fraction, by estimation, is 60 to 65%. The left ventricle has normal function. The left ventricle has no regional wall motion abnormalities. There is mild concentric left ventricular hypertrophy. Left ventricular diastolic parameters are consistent with Grade I diastolic dysfunction (impaired relaxation). The average left ventricular global longitudinal strain is -20.7 %. 2. Right ventricular systolic function is normal. The right ventricular size is normal. Tricuspid regurgitation signal is inadequate for assessing PA pressure. 3. The mitral valve is normal in structure. No evidence of mitral valve regurgitation. 4. The aortic valve is probably bicuspid. There is mild calcification of the aortic valve. There is moderate thickening of the aortic valve. Aortic valve regurgitation is not visualized. Mild to moderate aortic valve stenosis.  Comparison(s): 03/06/15 EF 60-65%.  FINDINGS Left Ventricle: Left ventricular ejection fraction, by estimation, is 60 to 65%. The left ventricle has normal function. The left ventricle has no regional wall motion abnormalities. The average left ventricular global longitudinal strain is -20.7 %. The left ventricular internal cavity size was normal in size. There is mild concentric left ventricular hypertrophy. Left ventricular diastolic parameters are consistent with Grade I diastolic dysfunction (impaired relaxation). Normal left ventricular filling pressure.  Right Ventricle: The right ventricular size is normal. No increase in right ventricular wall thickness. Right ventricular systolic function is normal. Tricuspid regurgitation signal is inadequate for assessing PA pressure.  Left Atrium: Left atrial size was normal in size.  Right Atrium: Right atrial size was normal in size.  Pericardium: There is no evidence of pericardial effusion.  Mitral Valve: The mitral valve is normal in  structure. No evidence of mitral valve regurgitation.  Tricuspid Valve: The tricuspid valve is normal in structure. Tricuspid valve regurgitation is not demonstrated.  Aortic Valve: The aortic valve is probably bicuspid. There is mild calcification of the aortic valve. There is moderate thickening of the aortic valve. Aortic valve regurgitation is not visualized. Mild to moderate aortic stenosis is present. Aortic valve mean gradient measures 17.3 mmHg. Aortic valve peak gradient measures 29.9 mmHg. Aortic valve area, by VTI measures 1.59 cm.  Pulmonic Valve: The pulmonic valve was not well visualized. Pulmonic valve regurgitation is not visualized.  Aorta: No coarctation is seen. The aortic root and ascending aorta are structurally normal, with no evidence of dilitation.  IAS/Shunts: The interatrial septum was not well visualized.   LEFT VENTRICLE PLAX 2D LVIDd:         3.70 cm  Diastology LVIDs:         2.30 cm  LV e' medial:    8.59 cm/s LV PW:         1.30 cm  LV E/e' medial:  8.3 LV IVS:        1.30 cm  LV e' lateral:   7.83 cm/s LVOT diam:     2.30 cm  LV E/e' lateral: 9.1 LV SV:         76 LV SV Index:   37       2D Longitudinal Strain LVOT Area:     4.15 cm 2D Strain GLS (A2C):   -19.4 % 2D Strain GLS (A3C):   -21.8 % 2D Strain GLS (A4C):   -20.9 % 2D Strain GLS Avg:     -20.7 %  3D Volume EF: 3D EF:        53 % LV EDV:       122 ml LV ESV:       57 ml LV SV:        65 ml  RIGHT VENTRICLE RV Basal diam:  3.90 cm RV S prime:     11.90 cm/s TAPSE (M-mode): 1.7 cm  LEFT ATRIUM             Index       RIGHT ATRIUM           Index LA diam:        3.10 cm 1.51 cm/m  RA Pressure: 3.00 mmHg LA Vol (A2C):   30.1 ml 14.63 ml/m RA Area:     13.90 cm LA Vol (A4C):   41.8 ml 20.32 ml/m RA Volume:   33.40 ml  16.23 ml/m LA Biplane Vol: 35.4 ml 17.21 ml/m AORTIC VALVE AV Area (Vmax):    1.50 cm AV Area (Vmean):   1.40 cm AV Area (VTI):     1.59 cm AV Vmax:            273.33 cm/s AV Vmean:          196.667 cm/s AV VTI:            0.481 m AV Peak Grad:      29.9 mmHg AV Mean Grad:      17.3 mmHg LVOT Vmax:         99.00 cm/s LVOT Vmean:        66.500 cm/s LVOT VTI:          0.184 m LVOT/AV VTI ratio: 0.38  AORTA Ao Root diam: 3.50 cm Ao Asc diam:  3.60 cm  MITRAL VALVE               TRICUSPID VALVE Estimated RAP:  3.00 mmHg  MV E velocity: 71.30 cm/s  SHUNTS MV A velocity: 96.50 cm/s  Systemic VTI:  0.18 m MV E/A ratio:  0.74        Systemic Diam: 2.30 cm  Mihai Croitoru MD Electronically signed by Thurmon Fair MD Signature Date/Time: 04/07/2020/2:04:52 PM    Final    MONITORS  LONG TERM MONITOR-LIVE TELEMETRY (3-14 DAYS) 02/27/2020  Narrative  One run of NSVT lasting 7 beats  13 days of data recorded on Zio monitor. Patient had a min HR of 63 bpm, max HR of 179 bpm, and avg HR of 91 bpm. Predominant underlying rhythm was Sinus Rhythm. No atrial fibrillation, high degree block, or pauses noted. One run of NSVT lasting 7 beats.  One run of SVT lasting 4 beats.  Isolated atrial and ventricular ectopy was rare (<1%). There were 8  triggered events, corresponding to sinus rhythm +/- PACs.   CT SCANS  CT CORONARY MORPH W/CTA COR W/SCORE 11/09/2020  Addendum 11/09/2020  3:46 PM ADDENDUM REPORT: 11/09/2020 15:44  CLINICAL DATA:  62 yo male with chest pain  EXAM: Cardiac/Coronary CTA  TECHNIQUE: A non-contrast, gated CT scan was obtained with axial slices of 3 mm through the heart for calcium scoring. Calcium scoring was performed using the Agatston method. A 120 kV prospective, gated, contrast cardiac scan was obtained. Gantry rotation speed was 250 msecs and collimation was 0.6 mm. Two sublingual nitroglycerin tablets (0.8 mg) were given. The 3D data set was reconstructed in 5% intervals of the 35-75% of the R-R cycle. Diastolic phases were analyzed on a dedicated workstation using MPR, MIP, and VRT modes. The patient received  95 cc of contrast.  FINDINGS: Image quality: Excellent.  Noise artifact is: Limited.  Coronary Arteries:  Normal coronary origin.  Right dominance.  Left main: The left main is a large caliber vessel with a normal take off from the left coronary cusp that bifurcates to form a left anterior descending artery and a left circumflex artery. There is no plaque or stenosis.  Left anterior descending artery: The LAD is a large vessel that has minimal (0-24) eccentric calcified stenosis in the proximal vessel. The LAD gives off 4 patent diagonal branches (large D1 and D2 and small D3 and D4).  Left circumflex artery: The LCX is non-dominant and there is mild (25-49) calcified stenosis in the mid vessel. The LCX terminates in small OM.  Right coronary artery: The RCA is dominant with normal take off from the right coronary cusp. There is no evidence of plaque or stenosis. The RCA terminates as a PDA.  Right Atrium: Right atrial size is within normal limits.  Right Ventricle: The right ventricular cavity is within normal limits. ICD lead in RV.  Left Atrium: Left atrial size is normal in size with no left atrial appendage filling defect.  Left Ventricle: The ventricular cavity size is within normal limits. There are no stigmata of prior infarction. There is no abnormal filling defect. Proximal septal thickening (18 mm) noted.  Pulmonary arteries: Normal in size without proximal filling defect.  Pulmonary veins: Normal pulmonary venous drainage.  Pericardium: Normal thickness with no significant effusion or calcium present.  Cardiac valves: The aortic valve is bicuspid and calcified. The mitral valve is normal structure without significant calcification.  Aorta: Normal caliber with no significant disease.  Extra-cardiac findings: See attached radiology report for non-cardiac structures.  IMPRESSION: 1. Coronary calcium score of 144. This was 20 percentile for age-, sex,  and race-matched controls.  2. Normal coronary origin with right dominance.  3. Mild CAD (most significant 25-49 calcified stenosis in Lcx).  4. ICD lead  5. Proximal septal thickening noted  4. Aortic atherosclerosis.  RECOMMENDATIONS: CAD-RADS 2: Mild non-obstructive CAD (25-49%). Consider non-atherosclerotic causes of chest pain. Consider preventive therapy and risk factor modification.  Olga Millers, MD   Electronically Signed By: Olga Millers M.D. On: 11/09/2020 15:44  Narrative EXAM: OVER-READ INTERPRETATION  CT CHEST  The following report is an over-read performed by radiologist Dr. Trudie Reed of [facility] on 11/09/2020. This over-read does not include interpretation of cardiac or coronary anatomy or pathology. The coronary calcium score/coronary CTA interpretation by the cardiologist is attached.  COMPARISON:  None.  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 Small pulmonary nodule in the posterior aspect of the right lower  lobe (axial image 53 of series 12) measuring 10 x 4 mm (mean diameter of 7 mm). Within the visualized portions of the thorax there are no suspicious appearing pulmonary nodules or masses, there is no acute consolidative airspace disease, no pleural effusions, no pneumothorax and no lymphadenopathy. Visualized portions of the upper demonstrated a shrunken appearance and nodular contour of the liver, indicative of advanced cirrhosis. There are no aggressive appearing lytic or blastic lesions noted in the visualized portions of the skeleton.  IMPRESSION: 1. Small pulmonary nodule in the right lower lobe with a mean diameter of 7 mm. This is nonspecific, but warrants attention on follow-up imaging. Non-contrast chest CT at 6-12 months is recommended. If the nodule is stable at time of repeat CT, then future CT at 18-24 months (from today's scan) is considered optional for  low-risk patients, but is recommended for high-risk patients. This recommendation follows the consensus statement: Guidelines for Management of Incidental Pulmonary Nodules Detected on CT Images: From the Fleischner Society 2017; Radiology 2017; 284:228-243. 2. Cirrhosis with extensive distal paraesophageal varices.  Electronically Signed: By: Trudie Reed M.D. On: 11/09/2020 14:15   CARDIAC MRI  MR CARDIAC MORPHOLOGY W WO CONTRAST 04/01/2020  Narrative CLINICAL DATA:  NSVT  EXAM: CARDIAC MRI  TECHNIQUE: The patient was scanned on a 1.5 Tesla Siemens magnet. A dedicated cardiac coil was used. Functional imaging was done using Fiesta sequences. 2,3, and 4 chamber views were done to assess for RWMA's. Modified Simpson's rule using a short axis stack was used to calculate an ejection fraction on a dedicated work [employer]. The patient received 12 cc of Gadavist. After 10 minutes inversion recovery sequences were used to assess for infiltration and scar tissue.  CONTRAST:  12 cc  of Gadavist  FINDINGS: Left ventricle:  -Asymmetric hypertrophy measuring up to 18mm in basal septum (10mm in posterior wall)  -Small size  -Normal systolic function  -Normal to mildly elevated ECV (28%)  -Patchy LGE at RV insertion site and basal anteroseptum. LGE represents 2% of total myocardial mass  LV EF: 68% (Normal 56-78%)  Absolute volumes:  LV EDV: 72mL (Normal 77-195 mL)  LV ESV: 23mL (Normal 19-72 mL)  LV SV: 49mL (Normal 51-133 mL)  CO: 5.7L/min (Normal 2.8-8.8 L/min)  Indexed volumes:  LV EDV: 41mL/sq-m (Normal 47-92 mL/sq-m)  LV ESV: 63mL/sq-m (Normal 13-30 mL/sq-m)  LV SV: 49mL/sq-m (Normal 32-62 mL/sq-m)  CI: 2.7L/min/sq-m (Normal 1.7-4.2 L/min/sq-m)  Right ventricle: Small size, normal systolic function  RV EF:  53% (Normal 47-74%)  Absolute volumes:  RV EDV: 81mL (Normal 88-227 mL)  RV ESV: 39mL (Normal 23-103 mL)  RV SV:  43mL (Normal 52-138 mL)  CO: 4.9L/min (Normal 2.8-8.8 L/min)  Indexed volumes:  RV EDV: 7mL/sq-m (Normal 55-105 mL/sq-m)  RV ESV: 68mL/sq-m (Normal 15-43 mL/sq-m)  RV SV: 69mL/sq-m (Normal 32-64 mL/sq-m)  CI: 2.3L/min/sq-m (Normal 1.7-4.2 L/min/sq-m)  Left atrium: Normal size  Right atrium: Normal size  Mitral valve: Mild to moderate regurgitation  Aortic valve: Appears bicuspid with fusion of left and right cusps. Trivial regurgitation.  Tricuspid valve: No regurgitation  Pulmonic valve: No regurgitation  Aorta: Normal proximal ascending aorta  Pericardium: Normal  IMPRESSION: 1. Asymmetric hypertrophy measuring up to 18mm in basal septum (10mm in posterior wall), consistent with hypertrophic cardiomyopathy  2. Patchy late gadolinium enhancement at RV insertion site/basal anteroseptum. LGE represents 2% of total myocardial mass  3.  Small LV size with normal systolic function (EF 68%)  4.  Small  RV size with normal systolic function (EF 53%)  5. Aortic valve appears bicuspid with fusion of left and right cusps. Valve appears stenotic, though peak velocity across aortic valve was not measured. Recommend echocardiogram to evaluate severity of aortic stenosis, as could be contributing to LV hypertrophy as above   Electronically Signed By: Epifanio Lesches MD On: 04/01/2020 20:35         Risk Assessment/Calculations:             Physical Exam:   VS:  BP 128/80 (BP Location: Left Arm, Patient Position: Sitting, Cuff Size: Normal)   Pulse (!) 101   Ht 5\' 6"  (1.676 m)   Wt 167 lb 12.8 oz (76.1 kg)   SpO2 99%   BMI 27.08 kg/m    Wt Readings from Last 3 Encounters:  11/02/22 167 lb 12.8 oz (76.1 kg)  03/22/22 187 lb 3.2 oz (84.9 kg)  02/15/22 185 lb (83.9 kg)    GEN: Well nourished, well developed in no acute distress NECK: No JVD; No carotid bruits CARDIAC: RRR, no murmurs, rubs, gallops RESPIRATORY: Diminished breath sound in the right base of  the lung. ABDOMEN: Soft, non-tender.  Mildly distended EXTREMITIES:  No edema; No deformity   ASSESSMENT AND PLAN: .   Pleural effusion: His PCP increased his Lasix from 20 mg daily to 40 mg twice daily last week.  Will obtain basic metabolic panel to make sure he has not been dehydrated.  Patient says he had a total of 9 right thoracentesis in the past 6 months at [facility].  The pleural effusion on the right side keep recurs.  He also mentions he has some ascites as well.  I will obtain BNP and echocardiogram.  But I suspect the recurrent pleural effusion and ascites is more related to cirrhosis rather than cardiac issue.  If pleural effusion keep recurs, may consider pleurodesis by pulmonology service.  Hypertrophic cardiomyopathy with history of NSVT status post Biotronik ICD: Followed by Dr. Ladona Ridgel.  No device shocks  Alcoholic cirrhosis: Followed by primary care provider, likely contributed to ascites.  History of syncope: No finding on ICD.       Dispo: 29-month follow-up  Signed, Azalee Course, PA

## 2022-11-02 NOTE — Patient Instructions (Addendum)
Medication Instructions:   Your physician recommends that you continue on your current medications as directed. Please refer to the Current Medication list given to you today.  *If you need a refill on your cardiac medications before your next appointment, please call your pharmacy*  Lab Work: Azalee Course, PA-C recommends that you have lab work TODAY:  BMP BNP   If you have labs (blood work) drawn today and your tests are completely normal, you will receive your results only by: MyChart Message (if you have MyChart) OR A paper copy in the mail If you have any lab test that is abnormal or we need to change your treatment, we will call you to review the results.  Testing/Procedures: Your physician has requested that you have an echocardiogram. Echocardiography is a painless test that uses sound waves to create images of your heart. It provides your doctor with information about the size and shape of your heart and how well your heart's chambers and valves are working. This procedure takes approximately one hour. There are no restrictions for this procedure. Please do NOT wear cologne, perfume, aftershave, or lotions (deodorant is allowed). Please arrive 15 minutes prior to your appointment time.  Follow-Up: At [facility], you and your health needs are our priority.  As part of our continuing mission to provide you with exceptional heart care, we have created designated Provider Care Teams.  These Care Teams include your primary Cardiologist (physician) and Advanced Practice Providers (APPs -  Physician Assistants and Nurse Practitioners) who all work together to provide you with the care you need, when you need it.  We recommend signing up for the patient portal called "MyChart".  Sign up information is provided on this After Visit Summary.  MyChart is used to connect with patients for Virtual Visits (Telemedicine).  Patients are able to view lab/test results, encounter notes, upcoming  appointments, etc.  Non-urgent messages can be sent to your provider as well.   To learn more about what you can do with MyChart, go to [email or web address].    Your next appointment:   6 month(s)  Provider:   Little Ishikawa, MD       Other Instructions

## 2022-11-03 LAB — BASIC METABOLIC PANEL
BUN/Creatinine Ratio: 12 (ref 10–24)
BUN: 10 mg/dL (ref 8–27)
CO2: 22 mmol/L (ref 20–29)
Calcium: 9.2 mg/dL (ref 8.6–10.2)
Chloride: 98 mmol/L (ref 96–106)
Creatinine, Ser: 0.83 mg/dL (ref 0.76–1.27)
Glucose: 309 mg/dL — ABNORMAL HIGH (ref 70–99)
Potassium: 4.5 mmol/L (ref 3.5–5.2)
Sodium: 136 mmol/L (ref 134–144)
eGFR: 100 mL/min/{1.73_m2} (ref >59)

## 2022-11-03 LAB — BRAIN NATRIURETIC PEPTIDE: BNP: 27.3 pg/mL (ref 0.0–100.0)

## 2022-11-04 NOTE — Progress Notes (Signed)
BNP normal, suggest recurrent pleural effusion is unlikely to be due to volume overload. Renal function and electrolyte normal despite recent increase in diuretic.

## 2022-11-09 ENCOUNTER — Telehealth (HOSPITAL_COMMUNITY): Payer: Self-pay | Admitting: Physician Assistant

## 2022-11-09 NOTE — Telephone Encounter (Signed)
Echocardiogram was cancelled for reason below:  11/08/2022 4:36 PM ZO:XWRUE, SHAWANA A  Cancel Rsn: Patient (going have it done a Elmer Lupita Leash from [employer] [facility])).

## 2022-11-09 NOTE — Telephone Encounter (Signed)
Noted ./cy 

## 2022-11-10 ENCOUNTER — Telehealth: Payer: Self-pay

## 2022-11-10 NOTE — Telephone Encounter (Addendum)
Left voice message for the patient to give our office a call back for results. Will try calling again.  ----- Message from Azalee Course, [place] sent at 11/04/2022  3:51 PM EDT ----- BNP normal, suggest recurrent pleural effusion is unlikely to be due to volume overload. Renal function and electrolyte normal despite recent increase in diuretic.

## 2022-11-14 ENCOUNTER — Ambulatory Visit: Payer: Medicaid Other

## 2022-11-14 DIAGNOSIS — R55 Syncope and collapse: Secondary | ICD-10-CM

## 2022-11-18 ENCOUNTER — Telehealth: Payer: Self-pay | Admitting: Cardiology

## 2022-11-18 NOTE — Telephone Encounter (Signed)
Pt returning nurses call from 6/20 regarding test results. Please advise

## 2022-11-18 NOTE — Telephone Encounter (Signed)
The patient has been notified of the result and verbalized understanding.  All questions (if any) were answered. Asencion Gowda, LPN 02/04/7828 5:62 AM

## 2022-11-25 LAB — CUP PACEART REMOTE DEVICE CHECK
Date Time Interrogation Session: 20240628125357
Implantable Lead Connection Status: 753985
Implantable Lead Implant Date: 20211227
Implantable Lead Location: 753860
Implantable Lead Model: 436909
Implantable Lead Serial Number: 81421424
Implantable Pulse Generator Implant Date: 20211227
Pulse Gen Model: 429522
Pulse Gen Serial Number: 84812005

## 2022-12-01 ENCOUNTER — Other Ambulatory Visit: Payer: Medicaid Other

## 2022-12-01 DIAGNOSIS — I361 Nonrheumatic tricuspid (valve) insufficiency: Secondary | ICD-10-CM | POA: Diagnosis not present

## 2022-12-01 DIAGNOSIS — J90 Pleural effusion, not elsewhere classified: Secondary | ICD-10-CM | POA: Diagnosis not present

## 2022-12-01 DIAGNOSIS — I350 Nonrheumatic aortic (valve) stenosis: Secondary | ICD-10-CM | POA: Diagnosis not present

## 2022-12-05 NOTE — Progress Notes (Signed)
 Remote ICD transmission.   

## 2022-12-21 ENCOUNTER — Encounter: Payer: Self-pay | Admitting: Adult Health

## 2023-01-24 ENCOUNTER — Emergency Department (HOSPITAL_COMMUNITY): Payer: Medicaid Other

## 2023-01-24 ENCOUNTER — Observation Stay (HOSPITAL_COMMUNITY)
Admission: EM | Admit: 2023-01-24 | Discharge: 2023-01-29 | Disposition: A | Discharge status: Home / Self Care | Payer: Medicaid Other | Attending: Internal Medicine | Admitting: Internal Medicine

## 2023-01-24 ENCOUNTER — Other Ambulatory Visit: Payer: Self-pay

## 2023-01-24 ENCOUNTER — Encounter (HOSPITAL_COMMUNITY): Payer: Self-pay

## 2023-01-24 DIAGNOSIS — Z91040 Latex allergy status: Secondary | ICD-10-CM | POA: Insufficient documentation

## 2023-01-24 DIAGNOSIS — I129 Hypertensive chronic kidney disease with stage 1 through stage 4 chronic kidney disease, or unspecified chronic kidney disease: Secondary | ICD-10-CM | POA: Diagnosis not present

## 2023-01-24 DIAGNOSIS — K7031 Alcoholic cirrhosis of liver with ascites: Secondary | ICD-10-CM | POA: Diagnosis present

## 2023-01-24 DIAGNOSIS — R0600 Dyspnea, unspecified: Principal | ICD-10-CM

## 2023-01-24 DIAGNOSIS — R0602 Shortness of breath: Secondary | ICD-10-CM | POA: Diagnosis present

## 2023-01-24 DIAGNOSIS — Z87891 Personal history of nicotine dependence: Secondary | ICD-10-CM | POA: Insufficient documentation

## 2023-01-24 DIAGNOSIS — D500 Iron deficiency anemia secondary to blood loss (chronic): Secondary | ICD-10-CM | POA: Diagnosis not present

## 2023-01-24 DIAGNOSIS — E118 Type 2 diabetes mellitus with unspecified complications: Secondary | ICD-10-CM | POA: Diagnosis present

## 2023-01-24 DIAGNOSIS — N1831 Chronic kidney disease, stage 3a: Secondary | ICD-10-CM | POA: Diagnosis not present

## 2023-01-24 DIAGNOSIS — E1122 Type 2 diabetes mellitus with diabetic chronic kidney disease: Secondary | ICD-10-CM | POA: Insufficient documentation

## 2023-01-24 DIAGNOSIS — I422 Other hypertrophic cardiomyopathy: Secondary | ICD-10-CM

## 2023-01-24 DIAGNOSIS — Z23 Encounter for immunization: Secondary | ICD-10-CM | POA: Diagnosis not present

## 2023-01-24 DIAGNOSIS — I8500 Esophageal varices without bleeding: Secondary | ICD-10-CM | POA: Diagnosis not present

## 2023-01-24 DIAGNOSIS — J90 Pleural effusion, not elsewhere classified: Secondary | ICD-10-CM | POA: Diagnosis not present

## 2023-01-24 DIAGNOSIS — Z66 Do not resuscitate: Secondary | ICD-10-CM | POA: Insufficient documentation

## 2023-01-24 DIAGNOSIS — Z79899 Other long term (current) drug therapy: Secondary | ICD-10-CM | POA: Insufficient documentation

## 2023-01-24 DIAGNOSIS — K766 Portal hypertension: Secondary | ICD-10-CM | POA: Diagnosis present

## 2023-01-24 DIAGNOSIS — Z95810 Presence of automatic (implantable) cardiac defibrillator: Secondary | ICD-10-CM | POA: Diagnosis not present

## 2023-01-24 LAB — HEPATIC FUNCTION PANEL
ALT: 24 U/L (ref 0–44)
AST: 35 U/L (ref 15–41)
Albumin: 3 g/dL — ABNORMAL LOW (ref 3.5–5.0)
Alkaline Phosphatase: 127 U/L — ABNORMAL HIGH (ref 38–126)
Bilirubin, Direct: 0.1 mg/dL (ref 0.0–0.2)
Indirect Bilirubin: 0.5 mg/dL (ref 0.3–0.9)
Total Bilirubin: 0.6 mg/dL (ref 0.3–1.2)
Total Protein: 6.6 g/dL (ref 6.5–8.1)

## 2023-01-24 LAB — CBC WITH DIFFERENTIAL/PLATELET
Abs Immature Granulocytes: 0.02 10*3/uL (ref 0.00–0.07)
Basophils Absolute: 0.1 10*3/uL (ref 0.0–0.1)
Basophils Relative: 1 %
Eosinophils Absolute: 0.1 10*3/uL (ref 0.0–0.5)
Eosinophils Relative: 1 %
HCT: 31.9 % — ABNORMAL LOW (ref 39.0–52.0)
Hemoglobin: 9.5 g/dL — ABNORMAL LOW (ref 13.0–17.0)
Immature Granulocytes: 0 %
Lymphocytes Relative: 21 %
Lymphs Abs: 1.2 10*3/uL (ref 0.7–4.0)
MCH: 23.8 pg — ABNORMAL LOW (ref 26.0–34.0)
MCHC: 29.8 g/dL — ABNORMAL LOW (ref 30.0–36.0)
MCV: 79.8 fL — ABNORMAL LOW (ref 80.0–100.0)
Monocytes Absolute: 0.8 10*3/uL (ref 0.1–1.0)
Monocytes Relative: 14 %
Neutro Abs: 3.6 10*3/uL (ref 1.7–7.7)
Neutrophils Relative %: 63 %
Platelets: 163 10*3/uL (ref 150–400)
RBC: 4 MIL/uL — ABNORMAL LOW (ref 4.22–5.81)
RDW: 14.6 % (ref 11.5–15.5)
WBC: 5.8 10*3/uL (ref 4.0–10.5)
nRBC: 0 % (ref 0.0–0.2)

## 2023-01-24 LAB — BASIC METABOLIC PANEL
Anion gap: 10 (ref 5–15)
BUN: 12 mg/dL (ref 6–20)
CO2: 24 mmol/L (ref 22–32)
Calcium: 8.7 mg/dL — ABNORMAL LOW (ref 8.9–10.3)
Chloride: 99 mmol/L (ref 98–111)
Creatinine, Ser: 0.72 mg/dL (ref 0.61–1.24)
GFR, Estimated: >60 mL/min (ref >60)
Glucose, Bld: 264 mg/dL — ABNORMAL HIGH (ref 70–99)
Potassium: 3.5 mmol/L (ref 3.5–5.1)
Sodium: 133 mmol/L — ABNORMAL LOW (ref 135–145)

## 2023-01-24 LAB — BODY FLUID CELL COUNT WITH DIFFERENTIAL
Eos, Fluid: 0 %
Lymphs, Fluid: 42 %
Monocyte-Macrophage-Serous Fluid: 52 % (ref 50–90)
Neutrophil Count, Fluid: 6 % (ref 0–25)
Total Nucleated Cell Count, Fluid: 103 uL (ref 0–1000)

## 2023-01-24 LAB — BRAIN NATRIURETIC PEPTIDE: B Natriuretic Peptide: 40 pg/mL (ref 0.0–100.0)

## 2023-01-24 MED ORDER — ALBUMIN HUMAN 25 % IV SOLN
50.0000 g | Freq: Once | INTRAVENOUS | Status: AC
  Administered 2023-01-24: 50 g via INTRAVENOUS
  Filled 2023-01-24: qty 200

## 2023-01-24 MED ORDER — SODIUM CHLORIDE 0.9 % IV SOLN: INTRAVENOUS | Status: DC

## 2023-01-24 MED ORDER — HYDROMORPHONE HCL 1 MG/ML IJ SOLN
1.0000 mg | Freq: Once | INTRAMUSCULAR | Status: AC
  Administered 2023-01-24: 1 mg via INTRAVENOUS
  Filled 2023-01-24: qty 1

## 2023-01-24 NOTE — Assessment & Plan Note (Signed)
Continue coreg 18.75 mg bid.

## 2023-01-24 NOTE — ED Triage Notes (Signed)
EMS reports from home, called out for increasing SOB x 2 weeks. Peripheral edema. Hx CHF.  BP 156/87 HR 100 RR 18 Sp02 96 RA CBG 348  20R forearm.

## 2023-01-24 NOTE — Procedures (Addendum)
Date of Procedure: 01/24/23 10:11 PM Procedure: Right Ultrasound guided thoracentesis Operator: Carollee Herter, DO  Indications for procedure: Pleural effusion  Description of procedure:  After verbal consent was obtained from the patient, the patient was placed in the upright sitting position.  2 patient Gennifer's are used due to the patient.  Preprocedural pause was performed by the nurse and myself.  The Right posterior chest wall was imaged with ultrasound.  Suitable pocket of fluid was found on theRight of the chest.  This area was marked with a marking pen.  Subsequently, this area was draped and prepped in the usual sterile fashion with 2% chlorhexidine.  The selected area was anesthetized with of 1% lidocaine without epinephrine.  Using a thoracentesis needle catheter combination, theRight pleural space was punctured on the first attempt. Clear, yellow Pleural fluid was aspirated.  The catheter was advanced into the pleural space and the needle was removed in its entirety.  The catheter was connected to a three-way stopcock.  Subsequently,4000 mL of clear, yellow pleural fluid was hand aspirated using a 60 mL syringe.  Catheter was removed in its entirety with the tip visualized.  Sterile Band-Aid was placed over the puncture site.  The patient tolerated the procedure well without complication or difficulty.  A stat portable chest x-ray will be ordered to rule out pneumothorax.  Pleural fluid will be sent off for routine cytology, cell count, glucose, LDH, total protein, culture.  Estimated blood loss < 1 ml.  Carollee Herter, DO [facility]  01/24/23 10:11 PM

## 2023-01-24 NOTE — Assessment & Plan Note (Signed)
Continue with Januvia and Glucotrol.  Add sliding scale insulin.  Check A1c.

## 2023-01-24 NOTE — Assessment & Plan Note (Addendum)
Observation med/tele bed. Pt agrees to bedside thoracentesis.  Sent for routine analysis.  May need to give IV albumin for large volume thoracentesis.

## 2023-01-24 NOTE — Assessment & Plan Note (Signed)
Verified that pt wants to be DNR/DNI

## 2023-01-24 NOTE — Assessment & Plan Note (Addendum)
Patient has some ascites on bedside ultrasound.  However this is not enough for safe bedside paracentesis.  Patient states that he is interested in TIPS procedure due to his recurrent hepatic hydrothorax/recurrent pleural effusions.  Patient has seen Dr. Elnoria Howard in the past.  Will consult Dr. Elnoria Howard.  I suspect that his recurrent pleural effusions are actually a right hepatic hydrothorax given the rapidity in which the pleural fluid reaccumulate's.  I did not think that pleurodesis would help him.  Will consult Dr. Elnoria Howard to decide if he would benefit from TIPS.

## 2023-01-24 NOTE — Assessment & Plan Note (Signed)
Chronic Stable 

## 2023-01-24 NOTE — Assessment & Plan Note (Addendum)
Chronic. Cardiac MRI from 03-2020 document HCOM

## 2023-01-24 NOTE — ED Notes (Signed)
Patient tolerating albumin administration well. Resting comfortably at this time. This RN did not observe any signs of adverse reaction during first 15 minutes of albumin administration. Thoracentesis site is still intact. Pt laying flat at this time.

## 2023-01-24 NOTE — Assessment & Plan Note (Signed)
Stable

## 2023-01-24 NOTE — ED Provider Notes (Signed)
[place] EMERGENCY DEPARTMENT AT [facility] Provider Note   CSN: [id number] Arrival date & time: 01/24/23  1828     History  Chief Complaint  Patient presents with   Shortness of Breath    Connor Vaughn is a 62 y.o. male.  62 year old male who presents with shortness of breath x 2 weeks.  History of right-sided pleural effusion requiring thoracentesis.  Last time was drained was a few weeks ago.  He says since that time he has had increasing shortness of breath that is worse with exertion.  Also notes increasing peripheral edema.  Denies any chest pain or chest pressure.  Cough has been nonproductive.  Called EMS and was transported here       Home Medications Prior to Admission medications   Medication Sig Start Date End Date Taking? Authorizing Provider  albuterol (VENTOLIN HFA) 108 (90 Base) MCG/ACT inhaler Inhale into the lungs every 6 (six) hours as needed for wheezing or shortness of breath.    [provider]  busPIRone (BUSPAR) 15 MG tablet Take 15 mg by mouth 3 (three) times daily.    [provider]  carvedilol (COREG) 12.5 MG tablet Take 1.5 tablets (18.75 mg total) by mouth 2 (two) times daily. 12/29/20   Graciella Freer, PA-C  dapagliflozin propanediol (FARXIGA) 10 MG TABS tablet Take 10 mg by mouth daily.    [provider]  DULoxetine (CYMBALTA) 30 MG capsule Take 30 mg by mouth daily.    [provider]  DULoxetine (CYMBALTA) 60 MG capsule Take 60 mg by mouth daily.    [provider]  ferrous sulfate 325 (65 FE) MG EC tablet Take 325 mg by mouth 3 (three) times daily with meals.    [provider]  furosemide (LASIX) 20 MG tablet Take 80 mg by mouth daily. Two tabs twice daily    [provider]  gabapentin (NEURONTIN) 600 MG tablet Take 600 mg by mouth 3 (three) times daily.    [provider]  glipiZIDE (GLUCOTROL) 10 MG tablet Take 10 mg by mouth 2 (two) times daily.  11/28/19   [provider]  hydrOXYzine (VISTARIL) 25 MG capsule Take 25 mg by mouth 3 (three) times daily as needed.    [provider]  ipratropium-albuterol (DUONEB) 0.5-2.5 (3) MG/3ML SOLN Take 3 mLs by nebulization.    [provider]  lidocaine (LIDODERM) 5 % Place 1 patch onto the skin daily. Remove & Discard patch within 12 hours or as directed by MD    [provider]  omeprazole (PRILOSEC) 20 MG capsule Take 20 mg by mouth daily.    [provider]  ondansetron (ZOFRAN-ODT) 4 MG disintegrating tablet Take 4 mg by mouth every 8 (eight) hours as needed for nausea or vomiting.    [provider]  potassium chloride SA (KLOR-CON M) 20 MEQ tablet Take 20 mEq by mouth daily.    [provider]  sitaGLIPtin (JANUVIA) 25 MG tablet Take 25 mg by mouth daily.    [provider]  spironolactone (ALDACTONE) 25 MG tablet Take 1 tablet (25 mg total) by mouth daily. 12/17/19   Leroy Sea, MD      Allergies    Latex    Review of Systems   Review of Systems  All other systems reviewed and are negative.   Physical Exam Updated Vital Signs BP 120/87   Pulse (!) 110   Temp 97.9 F (36.6 C) (Oral)  Resp 19   SpO2 98%  Physical Exam Vitals and nursing note reviewed.  Constitutional:      General: He is not in acute distress.    Appearance: Normal appearance. He is well-developed. He is not toxic-appearing.  HENT:     Head: Normocephalic and atraumatic.  Eyes:     General: Lids are normal.     Conjunctiva/sclera: Conjunctivae normal.     Pupils: Pupils are equal, round, and reactive to light.  Neck:     Thyroid: No thyroid mass.     Trachea: No tracheal deviation.  Cardiovascular:     Rate and Rhythm: Normal rate and regular rhythm.     Heart sounds: Normal heart sounds. No murmur heard.    No gallop.  Pulmonary:     Effort: Pulmonary effort is normal. Tachypnea and prolonged expiration present.     Breath  sounds: No stridor. Examination of the right-upper field reveals decreased breath sounds. Examination of the right-lower field reveals rhonchi. Decreased breath sounds and rhonchi present. No wheezing or rales.  Abdominal:     General: There is no distension.     Palpations: Abdomen is soft.     Tenderness: There is no abdominal tenderness. There is no rebound.  Musculoskeletal:        General: No tenderness. Normal range of motion.     Cervical back: Normal range of motion and neck supple.  Skin:    General: Skin is warm and dry.     Findings: No abrasion or rash.  Neurological:     Mental Status: He is alert and oriented to person, place, and time. Mental status is at baseline.     GCS: GCS eye subscore is 4. GCS verbal subscore is 5. GCS motor subscore is 6.     Cranial Nerves: Cranial nerves are intact. No cranial nerve deficit.     Sensory: No sensory deficit.     Motor: Motor function is intact.  Psychiatric:        Attention and Perception: Attention normal.        Speech: Speech normal.        Behavior: Behavior normal.     ED Results / Procedures / Treatments   Labs (all labs ordered are listed, but only abnormal results are displayed) Labs Reviewed  CBC WITH DIFFERENTIAL/PLATELET  BRAIN NATRIURETIC PEPTIDE  BASIC METABOLIC PANEL    EKG EKG Interpretation Date/Time:  Tuesday January 24 2023 19:23:05 EDT Ventricular Rate:  110 PR Interval:  170 QRS Duration:  88 QT Interval:  358 QTC Calculation: 485 R Axis:   -25  Text Interpretation: Sinus tachycardia Abnormal R-wave progression, early transition Inferior infarct, old Confirmed by Lorre Nick ([id number]) on 01/24/2023 7:33:26 PM  Radiology No results found.  Procedures Procedures    Medications Ordered in ED Medications  0.9 %  sodium chloride infusion (has no administration in time range)    ED Course/ Medical Decision Making/ A&P                                 Medical Decision Making Amount  and/or Complexity of Data Reviewed Labs: ordered. Radiology: ordered.  Risk Prescription drug management.   Patient's EKG shows sinus tachycardia for interpretation.  Chest x-ray shows evidence of right-sided pleural effusion.  Due to patient's increasing dyspnea patient will need to have IR guided thoracentesis.  BNP is normal.  Patient does have significant peripheral  edema.  Patient may benefit from IV Lasix.  Will require inpatient hospitalization.  Will consult the hospitalist team  CRITICAL CARE Performed by: Toy Baker Total critical care time: 45 minutes Critical care time was exclusive of separately billable procedures and treating other patients. Critical care was necessary to treat or prevent imminent or life-threatening deterioration. Critical care was time spent personally by me on the following activities: development of treatment plan with patient and/or surrogate as well as nursing, discussions with consultants, evaluation of patient's response to treatment, examination of patient, obtaining history from patient or surrogate, ordering and performing treatments and interventions, ordering and review of laboratory studies, ordering and review of radiographic studies, pulse oximetry and re-evaluation of patient's condition.        Final Clinical Impression(s) / ED Diagnoses Final diagnoses:  None    Rx / DC Orders ED Discharge Orders     None         Lorre Nick, MD 01/24/23 2042

## 2023-01-24 NOTE — H&P (Signed)
History and Physical    Connor Vaughn WGN:[id number] DOB: Oct 08, 1960 DOA: 01/24/2023  DOS: the patient was seen and examined on 01/24/2023  PCP: Ricky Stabs, NP-C   Patient coming from: Home  I have personally briefly reviewed patient's old medical records in [facility]  CC: SOB HPI: 62 year old Caucasian male history of alcoholic cirrhosis with ascites, recurrent right pleural effusion/hepatic hydrothorax, history of hypertrophic cardiomyopathy, history of SVT status post ICD, type 2 diabetes, CKD stage III, portal hypertension presents to the ER today with worsening shortness of breath.  He had a 4 L thoracentesis at [facility] on September 12, 2022.  He states that his lungs are filling up with fluid again.  He drinks greater than 60 ounces of water a day.  He is on Lasix and Aldactone.  He follows up with cardiology and GI.  Denies any fever.  States he takes his Lasix and his Aldactone regularly.  Does not miss any doses.  He does still drink alcohol occasionally.  On arrival temp 97.9 heart rate 108 blood pressure 147/98 satting 98% on room air.  White count 5.8, hemoglobin 9.5, platelets 163  Sodium 133, potassium 3.5, bicarb 24, BUN of 12, creatinine 0.72, glucose of 264  BNP normal at 40  Total protein 6.6, albumin 3.0, AST 35, ALT 24, alk phos 127, total bili 0.6  Chest x-ray shows large right pleural effusion.  [facility] hospitalist consulted.   ED Course: large right pleural effusion on Cxr. Pt amenable to bedside thoracentesis.  Review of Systems:  Review of Systems  Constitutional:  Positive for malaise/fatigue. Negative for chills and fever.  HENT: Negative.    Eyes: Negative.   Respiratory:  Positive for shortness of breath.   Cardiovascular:  Positive for leg swelling.  Gastrointestinal:  Negative for abdominal pain and blood in stool.       Abd bloating  Musculoskeletal: Negative.   Skin: Negative.   Neurological: Negative.    Endo/Heme/Allergies: Negative.   Psychiatric/Behavioral: Negative.    All other systems reviewed and are negative.   Past Medical History:  Diagnosis Date   Alcohol abuse 12/24/2015   Cirrhosis (HCC)    Diabetes mellitus, type II (HCC)    DKA (diabetic ketoacidoses) 04/04/2019   H/O fracture    nasal x 2   Sleep apnea    UGIB (upper gastrointestinal bleed) 12/2019    Past Surgical History:  Procedure Laterality Date   ESOPHAGEAL BANDING  01/14/2020   Procedure: ESOPHAGEAL VARICES BANDING;  Surgeon: Jeani Hawking, MD;  Location: [facility] ENDOSCOPY;  Service: Endoscopy;;   ESOPHAGOGASTRODUODENOSCOPY N/A 01/13/2020   Procedure: ESOPHAGOGASTRODUODENOSCOPY (EGD);  Surgeon: Charna Elizabeth, MD;  Location: [facility];  Service: Endoscopy;  Laterality: N/A;   ESOPHAGOGASTRODUODENOSCOPY (EGD) WITH PROPOFOL N/A 04/05/2019   Procedure: ESOPHAGOGASTRODUODENOSCOPY (EGD) WITH PROPOFOL;  Surgeon: Jeani Hawking, MD;  Location: [facility] ENDOSCOPY;  Service: Endoscopy;  Laterality: N/A;   ESOPHAGOGASTRODUODENOSCOPY (EGD) WITH PROPOFOL N/A 01/14/2020   Procedure: ESOPHAGOGASTRODUODENOSCOPY (EGD) WITH PROPOFOL;  Surgeon: Jeani Hawking, MD;  Location: [facility] ENDOSCOPY;  Service: Endoscopy;  Laterality: N/A;   HERNIA REPAIR     Age 86   ICD IMPLANT N/A 05/18/2020   Procedure: ICD IMPLANT;  Surgeon: Marinus Maw, MD;  Location: [facility];  Service: Cardiovascular;  Laterality: N/A;   LUMBAR LAMINECTOMY/DECOMPRESSION MICRODISCECTOMY N/A 12/15/2019   Procedure: LUMBAR THREE-SACRAL ONE LAMINECTOMY WITH LUMBAR FOUR-FIVE DISCECTOMY ;  Surgeon: Bethann Goo, DO;  Location: [facility] OR;  Service: Neurosurgery;  Laterality: N/A;   thoracocenteis       reports that he quit smoking about 14 years ago. His smoking use included cigarettes. He has never used smokeless tobacco. He reports current alcohol use. He reports that he does not use drugs.  Allergies  Allergen Reactions   Latex Rash    Family History  Problem  Relation Age of Onset   Diabetes Mother    Heart failure Mother    Valvular heart disease Mother    Diabetes Mellitus II Father    Heart failure Father    Dementia Father    Seizures Neg Hx     Prior to Admission medications   Medication Sig Start Date End Date Taking? Authorizing Provider  albuterol (VENTOLIN HFA) 108 (90 Base) MCG/ACT inhaler Inhale into the lungs every 6 (six) hours as needed for wheezing or shortness of breath.    [provider]  busPIRone (BUSPAR) 15 MG tablet Take 15 mg by mouth 3 (three) times daily.    [provider]  carvedilol (COREG) 12.5 MG tablet Take 1.5 tablets (18.75 mg total) by mouth 2 (two) times daily. 12/29/20   Graciella Freer, PA-C  dapagliflozin propanediol (FARXIGA) 10 MG TABS tablet Take 10 mg by mouth daily.    [provider]  DULoxetine (CYMBALTA) 30 MG capsule Take 30 mg by mouth daily.    [provider]  DULoxetine (CYMBALTA) 60 MG capsule Take 60 mg by mouth daily.    [provider]  ferrous sulfate 325 (65 FE) MG EC tablet Take 325 mg by mouth 3 (three) times daily with meals.    [provider]  furosemide (LASIX) 20 MG tablet Take 80 mg by mouth daily. Two tabs twice daily    [provider]  gabapentin (NEURONTIN) 600 MG tablet Take 600 mg by mouth 3 (three) times daily.    [provider]  glipiZIDE (GLUCOTROL) 10 MG tablet Take 10 mg by mouth 2 (two) times daily. 11/28/19   [provider]  hydrOXYzine (VISTARIL) 25 MG capsule Take 25 mg by mouth 3 (three) times daily as needed.    [provider]  ipratropium-albuterol (DUONEB) 0.5-2.5 (3) MG/3ML SOLN Take 3 mLs by nebulization.    [provider]  lidocaine (LIDODERM) 5 % Place 1 patch onto the skin daily. Remove & Discard patch within 12 hours or as directed by MD    [provider]  omeprazole (PRILOSEC) 20 MG capsule Take 20 mg by mouth daily.    [provider]  ondansetron (ZOFRAN-ODT) 4 MG disintegrating tablet Take 4 mg by mouth every 8 (eight) hours as needed for nausea or vomiting.    [provider]  potassium chloride SA (KLOR-CON M) 20 MEQ tablet Take 20 mEq by mouth daily.    [provider]  sitaGLIPtin (JANUVIA) 25 MG tablet Take 25 mg by mouth daily.    [provider]  spironolactone (ALDACTONE) 25 MG tablet Take 1 tablet (25 mg total) by mouth daily. 12/17/19   Leroy Sea, MD    Physical Exam: Vitals:   01/24/23 1842 01/24/23 1900 01/24/23 1923 01/24/23 2000  BP: (!) 147/98 120/87  (!) 148/90  Pulse: (!) 108 (!) 110 (!) 110 (!) 106  Resp: 20  19 20   Temp: 97.9 F (36.6 C)     TempSrc: Oral     SpO2: 98% 96% 98% 97%    Physical Exam Vitals and nursing note reviewed.  Constitutional:  General: He is not in acute distress.    Appearance: Normal appearance. He is not toxic-appearing or diaphoretic.  HENT:     Head: Normocephalic and atraumatic.     Nose: Nose normal.  Eyes:     General: No scleral icterus. Cardiovascular:     Rate and Rhythm: Regular rhythm. Tachycardia present.     Pulses: Normal pulses.  Pulmonary:     Effort: No respiratory distress.     Comments: Large right pleural effusion on bedside thoracic U/s No BS on right side Abdominal:     General: There is distension.     Palpations: Abdomen is soft.     Tenderness: There is no left CVA tenderness or guarding.     Comments: Small/moderate abd ascites. Not safe for bedside paracentesis  Musculoskeletal:     Right lower leg: 1+ Edema present.     Left lower leg: 1+ Edema present.  Skin:    General: Skin is warm and dry.     Capillary Refill: Capillary refill takes less than 2 seconds.  Neurological:     General: No focal deficit present.     Mental Status: He is alert and oriented to person, place, and time.      Labs on Admission: I have personally reviewed following labs and imaging studies  CBC: Recent  Labs  Lab 01/24/23 1917  WBC 5.8  NEUTROABS 3.6  HGB 9.5*  HCT 31.9*  MCV 79.8*  PLT 163   Basic Metabolic Panel: Recent Labs  Lab 01/24/23 1917  NA 133*  K 3.5  CL 99  CO2 24  GLUCOSE 264*  BUN 12  CREATININE 0.72  CALCIUM 8.7*   GFR: CrCl cannot be calculated (Unknown ideal weight.). Liver Function Tests: Recent Labs  Lab 01/24/23 1917  AST 35  ALT 24  ALKPHOS 127*  BILITOT 0.6  PROT 6.6  ALBUMIN 3.0*   BNP (last 3 results) Recent Labs    11/02/22 1157 01/24/23 1917  BNP 27.3 40.0   Urine analysis:    Component Value Date/Time   COLORURINE YELLOW 02/20/2021 1512   APPEARANCEUR CLEAR 02/20/2021 1512   LABSPEC 1.038 (H) 02/20/2021 1512   PHURINE 5.0 02/20/2021 1512   GLUCOSEU >=500 (A) 02/20/2021 1512   HGBUR NEGATIVE 02/20/2021 1512   BILIRUBINUR NEGATIVE 02/20/2021 1512   KETONESUR 5 (A) 02/20/2021 1512   PROTEINUR 30 (A) 02/20/2021 1512   NITRITE NEGATIVE 02/20/2021 1512   LEUKOCYTESUR NEGATIVE 02/20/2021 1512    Radiological Exams on Admission: I have personally reviewed images DG Chest Port 1 View  Result Date: 01/24/2023 CLINICAL DATA:  Increasing shortness of breath for 2 weeks. Peripheral edema. History of congestive heart failure. EXAM: PORTABLE CHEST 1 VIEW COMPARISON:  01/12/2023 FINDINGS: Cardiac pacemaker. Heart size is normal for technique. Interval development of a large right pleural effusion. Likely compressive atelectasis or consolidation in the right lung. No pneumothorax. Left lung is clear. IMPRESSION: Interval development of a large right pleural effusion since prior study. Electronically Signed   By: Burman Nieves M.D.   On: 01/24/2023 21:00    EKG: My personal interpretation of EKG shows: sinus tachycardia  Assessment/Plan Active Problems:   Alcoholic cirrhosis of liver with ascites (HCC)   Recurrent right pleural effusion/hepatic hydrothorax   CKD stage 3a, GFR 45-59 ml/min (HCC)   DM (diabetes mellitus), type 2 with  complications (HCC)   Hypertrophic cardiomyopathy (HCC)   ICD (implantable cardioverter-defibrillator) in place   Portal hypertension (HCC)   DNR (  do not resuscitate)/DNI(Do Not Intubate)    Assessment and Plan: DNR (do not resuscitate)/DNI(Do Not Intubate) Verified that pt wants to be DNR/DNI  Portal hypertension (HCC) Continue coreg 18.75 mg bid.  ICD (implantable cardioverter-defibrillator) in place Chronic. Stable.  Hypertrophic cardiomyopathy (HCC) Chronic. Cardiac MRI from 03-2020 document HCOM  DM (diabetes mellitus), type 2 with complications (HCC) Continue with Januvia and Glucotrol.  Add sliding scale insulin.  Check A1c.  CKD stage 3a, GFR 45-59 ml/min (HCC) Stable.  Recurrent right pleural effusion/hepatic hydrothorax Observation med/tele bed. Pt agrees to bedside thoracentesis.  Sent for routine analysis.  May need to give IV albumin for large volume thoracentesis.  Alcoholic cirrhosis of liver with ascites (HCC) Patient has some ascites on bedside ultrasound.  However this is not enough for safe bedside paracentesis.  Patient states that he is interested in TIPS procedure due to his recurrent hepatic hydrothorax/recurrent pleural effusions.  Patient has seen Dr. Elnoria Howard in the past.  Will consult Dr. Elnoria Howard.  I suspect that his recurrent pleural effusions are actually a right hepatic hydrothorax given the rapidity in which the pleural fluid reaccumulate's.  I did not think that pleurodesis would help him.  Will consult Dr. Elnoria Howard to decide if he would benefit from TIPS.   DVT prophylaxis: SCDs Code Status: DNR/DNI(Do NOT Intubate). Verified with pt at bedside Family Communication: no family at bedside  Disposition Plan: return home  Consults called: GI consult to Dr. Elnoria Howard placed via secure chat  Admission status: Observation, Telemetry bed   Carollee Herter, DO [facility] 01/24/2023, 10:36 PM

## 2023-01-24 NOTE — ED Notes (Signed)
Lab called at this time to add on hepatic function panel.

## 2023-01-24 NOTE — Subjective & Objective (Signed)
CC: SOB HPI: 62 year old Caucasian male history of alcoholic cirrhosis with ascites, recurrent right pleural effusion/hepatic hydrothorax, history of hypertrophic cardiomyopathy, history of SVT status post ICD, type 2 diabetes, CKD stage III, portal hypertension presents to the ER today with worsening shortness of breath.  He had a 4 L thoracentesis at [facility] on September 12, 2022.  He states that his lungs are filling up with fluid again.  He drinks greater than 60 ounces of water a day.  He is on Lasix and Aldactone.  He follows up with cardiology and GI.  Denies any fever.  States he takes his Lasix and his Aldactone regularly.  Does not miss any doses.  He does still drink alcohol occasionally.  On arrival temp 97.9 heart rate 108 blood pressure 147/98 satting 98% on room air.  White count 5.8, hemoglobin 9.5, platelets 163  Sodium 133, potassium 3.5, bicarb 24, BUN of 12, creatinine 0.72, glucose of 264  BNP normal at 40  Total protein 6.6, albumin 3.0, AST 35, ALT 24, alk phos 127, total bili 0.6  Chest x-ray shows large right pleural effusion.  [facility] hospitalist consulted.

## 2023-01-25 DIAGNOSIS — J90 Pleural effusion, not elsewhere classified: Secondary | ICD-10-CM | POA: Diagnosis not present

## 2023-01-25 LAB — IRON AND TIBC
Iron: 13 ug/dL — ABNORMAL LOW (ref 45–182)
Saturation Ratios: 4 % — ABNORMAL LOW (ref 17.9–39.5)
TIBC: 367 ug/dL (ref 250–450)
UIBC: 354 ug/dL

## 2023-01-25 LAB — VITAMIN B12: Vitamin B-12: 590 pg/mL (ref 180–914)

## 2023-01-25 LAB — COMPREHENSIVE METABOLIC PANEL
ALT: 18 U/L (ref 0–44)
AST: 25 U/L (ref 15–41)
Albumin: 3 g/dL — ABNORMAL LOW (ref 3.5–5.0)
Alkaline Phosphatase: 94 U/L (ref 38–126)
Anion gap: 8 (ref 5–15)
BUN: 13 mg/dL (ref 6–20)
CO2: 27 mmol/L (ref 22–32)
Calcium: 8.1 mg/dL — ABNORMAL LOW (ref 8.9–10.3)
Chloride: 96 mmol/L — ABNORMAL LOW (ref 98–111)
Creatinine, Ser: 0.8 mg/dL (ref 0.61–1.24)
GFR, Estimated: >60 mL/min (ref >60)
Glucose, Bld: 330 mg/dL — ABNORMAL HIGH (ref 70–99)
Potassium: 3.5 mmol/L (ref 3.5–5.1)
Sodium: 131 mmol/L — ABNORMAL LOW (ref 135–145)
Total Bilirubin: 0.9 mg/dL (ref 0.3–1.2)
Total Protein: 5.8 g/dL — ABNORMAL LOW (ref 6.5–8.1)

## 2023-01-25 LAB — PROTEIN, PLEURAL OR PERITONEAL FLUID: Total protein, fluid: <3 g/dL

## 2023-01-25 LAB — CBC WITH DIFFERENTIAL/PLATELET
Abs Immature Granulocytes: 0.01 10*3/uL (ref 0.00–0.07)
Basophils Absolute: 0 10*3/uL (ref 0.0–0.1)
Basophils Relative: 1 %
Eosinophils Absolute: 0.1 10*3/uL (ref 0.0–0.5)
Eosinophils Relative: 2 %
HCT: 24.9 % — ABNORMAL LOW (ref 39.0–52.0)
Hemoglobin: 7.4 g/dL — ABNORMAL LOW (ref 13.0–17.0)
Immature Granulocytes: 0 %
Lymphocytes Relative: 21 %
Lymphs Abs: 0.7 10*3/uL (ref 0.7–4.0)
MCH: 23.7 pg — ABNORMAL LOW (ref 26.0–34.0)
MCHC: 29.7 g/dL — ABNORMAL LOW (ref 30.0–36.0)
MCV: 79.8 fL — ABNORMAL LOW (ref 80.0–100.0)
Monocytes Absolute: 0.5 10*3/uL (ref 0.1–1.0)
Monocytes Relative: 13 %
Neutro Abs: 2.2 10*3/uL (ref 1.7–7.7)
Neutrophils Relative %: 63 %
Platelets: 109 10*3/uL — ABNORMAL LOW (ref 150–400)
RBC: 3.12 MIL/uL — ABNORMAL LOW (ref 4.22–5.81)
RDW: 14.6 % (ref 11.5–15.5)
WBC: 3.5 10*3/uL — ABNORMAL LOW (ref 4.0–10.5)
nRBC: 0 % (ref 0.0–0.2)

## 2023-01-25 LAB — GLUCOSE, CAPILLARY
Glucose-Capillary: 281 mg/dL — ABNORMAL HIGH (ref 70–99)
Glucose-Capillary: 87 mg/dL (ref 70–99)
Glucose-Capillary: 149 mg/dL — ABNORMAL HIGH (ref 70–99)
Glucose-Capillary: 182 mg/dL — ABNORMAL HIGH (ref 70–99)

## 2023-01-25 LAB — ALBUMIN, PLEURAL OR PERITONEAL FLUID: Albumin, Fluid: <1.5 g/dL

## 2023-01-25 LAB — RETICULOCYTES
Immature Retic Fract: 30.2 % — ABNORMAL HIGH (ref 2.3–15.9)
RBC.: 3.06 MIL/uL — ABNORMAL LOW (ref 4.22–5.81)
Retic Count, Absolute: 92.1 10*3/uL (ref 19.0–186.0)
Retic Ct Pct: 3 % (ref 0.4–3.1)

## 2023-01-25 LAB — HEMOGLOBIN A1C
Hgb A1c MFr Bld: 10 % — ABNORMAL HIGH (ref 4.8–5.6)
Mean Plasma Glucose: 240.3 mg/dL

## 2023-01-25 LAB — HIV ANTIBODY (ROUTINE TESTING W REFLEX): HIV Screen 4th Generation wRfx: NONREACTIVE

## 2023-01-25 LAB — HEMOGLOBIN AND HEMATOCRIT, BLOOD
HCT: 25 % — ABNORMAL LOW (ref 39.0–52.0)
HCT: 25.9 % — ABNORMAL LOW (ref 39.0–52.0)
Hemoglobin: 7.4 g/dL — ABNORMAL LOW (ref 13.0–17.0)
Hemoglobin: 7.8 g/dL — ABNORMAL LOW (ref 13.0–17.0)

## 2023-01-25 LAB — MAGNESIUM: Magnesium: 1.7 mg/dL (ref 1.7–2.4)

## 2023-01-25 LAB — FOLATE: Folate: 21.7 ng/mL (ref >5.9)

## 2023-01-25 LAB — LACTATE DEHYDROGENASE, PLEURAL OR PERITONEAL FLUID: LD, Fluid: 32 U/L — ABNORMAL HIGH (ref 3–23)

## 2023-01-25 LAB — GLUCOSE, PLEURAL OR PERITONEAL FLUID: Glucose, Fluid: 286 mg/dL

## 2023-01-25 LAB — FERRITIN: Ferritin: 6 ng/mL — ABNORMAL LOW (ref 24–336)

## 2023-01-25 MED ORDER — MELATONIN 5 MG PO TABS
10.0000 mg | ORAL_TABLET | Freq: Every evening | ORAL | Status: DC | PRN
  Administered 2023-01-25: 10 mg via ORAL
  Filled 2023-01-25 (×2): qty 2

## 2023-01-25 MED ORDER — CARVEDILOL 6.25 MG PO TABS
18.7500 mg | ORAL_TABLET | Freq: Two times a day (BID) | ORAL | Status: DC
  Administered 2023-01-25 – 2023-01-29 (×9): 18.75 mg via ORAL
  Filled 2023-01-25 (×12): qty 1

## 2023-01-25 MED ORDER — ACETAMINOPHEN 325 MG PO TABS
650.0000 mg | ORAL_TABLET | Freq: Four times a day (QID) | ORAL | Status: DC | PRN
  Administered 2023-01-25 – 2023-01-27 (×3): 650 mg via ORAL
  Filled 2023-01-25 (×3): qty 2

## 2023-01-25 MED ORDER — ACETAMINOPHEN 650 MG RE SUPP: 650.0000 mg | Freq: Four times a day (QID) | RECTAL | Status: DC | PRN

## 2023-01-25 MED ORDER — TRAZODONE HCL 50 MG PO TABS
50.0000 mg | ORAL_TABLET | Freq: Every evening | ORAL | Status: DC | PRN
  Administered 2023-01-26 (×2): 50 mg via ORAL
  Filled 2023-01-25 (×3): qty 1

## 2023-01-25 MED ORDER — INSULIN ASPART 100 UNIT/ML IJ SOLN: 0.0000 [IU] | Freq: Every day | INTRAMUSCULAR | Status: DC

## 2023-01-25 MED ORDER — FUROSEMIDE 40 MG PO TABS
80.0000 mg | ORAL_TABLET | Freq: Every day | ORAL | Status: DC
  Administered 2023-01-25 – 2023-01-26 (×2): 80 mg via ORAL
  Filled 2023-01-25 (×2): qty 2

## 2023-01-25 MED ORDER — ONDANSETRON HCL 4 MG PO TABS: 4.0000 mg | ORAL_TABLET | Freq: Four times a day (QID) | ORAL | Status: DC | PRN

## 2023-01-25 MED ORDER — GUAIFENESIN-DM 100-10 MG/5ML PO SYRP
5.0000 mL | ORAL_SOLUTION | ORAL | Status: DC | PRN
  Administered 2023-01-25 – 2023-01-29 (×10): 5 mL via ORAL
  Filled 2023-01-25 (×10): qty 10

## 2023-01-25 MED ORDER — ONDANSETRON HCL 4 MG/2ML IJ SOLN
4.0000 mg | Freq: Four times a day (QID) | INTRAMUSCULAR | Status: DC | PRN
  Administered 2023-01-25: 4 mg via INTRAVENOUS
  Filled 2023-01-25: qty 2

## 2023-01-25 MED ORDER — LIDOCAINE 5 % EX PTCH
1.0000 | MEDICATED_PATCH | CUTANEOUS | Status: DC
  Administered 2023-01-25 – 2023-01-28 (×4): 1 via TRANSDERMAL
  Filled 2023-01-25 (×6): qty 1

## 2023-01-25 MED ORDER — DAPAGLIFLOZIN PROPANEDIOL 10 MG PO TABS
10.0000 mg | ORAL_TABLET | Freq: Every day | ORAL | Status: DC
  Administered 2023-01-25 – 2023-01-29 (×5): 10 mg via ORAL
  Filled 2023-01-25 (×5): qty 1

## 2023-01-25 MED ORDER — SPIRONOLACTONE 25 MG PO TABS
25.0000 mg | ORAL_TABLET | Freq: Every day | ORAL | Status: DC
  Administered 2023-01-25 – 2023-01-26 (×2): 25 mg via ORAL
  Filled 2023-01-25 (×2): qty 1

## 2023-01-25 MED ORDER — PANTOPRAZOLE SODIUM 40 MG PO TBEC
40.0000 mg | DELAYED_RELEASE_TABLET | Freq: Every day | ORAL | Status: DC
  Administered 2023-01-25 – 2023-01-29 (×5): 40 mg via ORAL
  Filled 2023-01-25 (×5): qty 1

## 2023-01-25 MED ORDER — LINAGLIPTIN 5 MG PO TABS
5.0000 mg | ORAL_TABLET | Freq: Every day | ORAL | Status: DC
  Administered 2023-01-25 – 2023-01-29 (×5): 5 mg via ORAL
  Filled 2023-01-25 (×5): qty 1

## 2023-01-25 MED ORDER — INSULIN ASPART 100 UNIT/ML IJ SOLN
0.0000 [IU] | Freq: Three times a day (TID) | INTRAMUSCULAR | Status: DC
  Administered 2023-01-25: 1 [IU] via SUBCUTANEOUS
  Administered 2023-01-25: 5 [IU] via SUBCUTANEOUS
  Administered 2023-01-26: 2 [IU] via SUBCUTANEOUS
  Administered 2023-01-26: 7 [IU] via SUBCUTANEOUS
  Administered 2023-01-26 – 2023-01-28 (×5): 2 [IU] via SUBCUTANEOUS
  Administered 2023-01-28: 5 [IU] via SUBCUTANEOUS
  Administered 2023-01-28: 2 [IU] via SUBCUTANEOUS

## 2023-01-25 MED ORDER — HYDROMORPHONE HCL 1 MG/ML IJ SOLN
1.0000 mg | Freq: Once | INTRAMUSCULAR | Status: AC
  Administered 2023-01-25: 1 mg via INTRAVENOUS
  Filled 2023-01-25: qty 1

## 2023-01-25 MED ORDER — FUROSEMIDE 10 MG/ML IJ SOLN
40.0000 mg | Freq: Once | INTRAMUSCULAR | Status: AC
  Administered 2023-01-25: 40 mg via INTRAVENOUS
  Filled 2023-01-25: qty 4

## 2023-01-25 MED ORDER — GLIPIZIDE 10 MG PO TABS
10.0000 mg | ORAL_TABLET | Freq: Two times a day (BID) | ORAL | Status: DC
  Administered 2023-01-25 – 2023-01-29 (×9): 10 mg via ORAL
  Filled 2023-01-25 (×3): qty 2
  Filled 2023-01-25 (×2): qty 1
  Filled 2023-01-25 (×2): qty 2
  Filled 2023-01-25 (×2): qty 1

## 2023-01-25 MED ORDER — INFLUENZA VIRUS VACC SPLIT PF (FLUZONE) 0.5 ML IM SUSY
0.5000 mL | PREFILLED_SYRINGE | INTRAMUSCULAR | Status: AC
  Administered 2023-01-26: 0.5 mL via INTRAMUSCULAR
  Filled 2023-01-25: qty 0.5

## 2023-01-25 MED ORDER — POTASSIUM CHLORIDE CRYS ER 20 MEQ PO TBCR
20.0000 meq | EXTENDED_RELEASE_TABLET | Freq: Every day | ORAL | Status: DC
  Administered 2023-01-25 – 2023-01-29 (×5): 20 meq via ORAL
  Filled 2023-01-25 (×5): qty 1

## 2023-01-25 NOTE — ED Notes (Signed)
Albumin finished at this time. Pt tolerated well. Thoracentesis site checked and intact. Pt transferred to 1417 by this RN.

## 2023-01-25 NOTE — Progress Notes (Signed)
PROGRESS NOTE EDVARDO Vaughn  BJY:[id number] DOB: [birth year] DOA: 01/24/2023 PCP: Ricky Stabs, NP-C  Brief Narrative/Hospital Course: 66 yom w/ alcoholic cirrhosis with ascites, recurrent right pleural effusion/hepatic hydrothorax, history of hypertrophic cardiomyopathy, history of SVT status post ICD, type 2 diabetes, CKD stage III, portal hypertension presents to the ER today with worsening shortness of breath.  He had a 4 L thoracentesis at [facility] on September 12, 2022.  He states that his lungs are filling up with fluid again.  He drinks greater than 60 ounces of water a day.  He is on Lasix and Aldactone.  He follows up with cardiology and GI. IN YQ:MVHQ 97.9 heart rate 108 blood pressure 147/98 satting 98% on room air. WBC-5.8, hemoglobin 9.5, platelets 163 Sodium 133, potassium 3.5, bicarb 24, BUN of 12, creatinine 0.72, glucose of 264 BNP normal at 40.Total protein 6.6, albumin 3.0, AST 35, ALT 24, alk phos 127, total bili 0.6.Chest x-ray shows large right pleural effusion. [facility] hospital[facility] consulted.Underwent thoracentesis with removal of 4 L. Patient was given IV albumin monitored overnight Pleural fluid albumin less than 1.5 LDH 32 total nucleated cells 103,Gram stain no organism. GI consulted due to patient request for TIPS eval   Subjective: Patient seen and examined this morning resting comfortably alert awake Gets winded and unsteady on ambulation/ Daughter at the bedside But GI evaluation   Assessment and Plan: Principal Problem:   Recurrent right pleural effusion/hepatic hydrothorax Active Problems:   Alcoholic cirrhosis of liver with ascites (HCC)   CKD stage 3a, GFR 45-59 ml/min (HCC)   DM (diabetes mellitus), type 2 with complications (HCC)   Hypertrophic cardiomyopathy (HCC)   ICD (implantable cardioverter-defibrillator) in place   Portal hypertension (HCC)   DNR (do not resuscitate)/DNI(Do Not Intubate)   Alcoholic cirrhosis of liver with  ascites Recurrent right pleural effusion/hepatic hydrothorax Underwent thoracentesis with removal of 4 L.Patient was given IV albumin monitored overnight Pleural fluid albumin less than 1.5 LDH 32 total nucleated cells 103,Gram stain no organism. GI consulted due to patient request for TIPS eval. he is on Aldactone, Lasix.  Acute on chronic anemia: Hemoglobin at baseline around 9.5 g now downtrending at 7.4, reports reviewed he had black-colored stool several days ago. Denies obvious bleeding.  Reports he had colonoscopy with 2 polyps removed 6 months ago and also had EGD with banding.  GI consulted.  Check Hemoccult, check serial H&H, ANEMIA PANEL. Recent Labs    01/24/23 1917 01/25/23 0512 01/25/23 0746  HGB 9.5* 7.4* 7.4*  MCV 79.8* 79.8*  --   RETICCTPCT  --  3.0  --     Portal hypertension Continue coreg 18.75 mg bid.  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  Add sliding scale insulin.With uncontrolled HbA1c Recent Labs  Lab 01/25/23 0512 01/25/23 0729 01/25/23 1135  GLUCAP  --  281* 87  HGBA1C 10.0*  --   --     CKD stage 3a: Stable.  DVT prophylaxis: SCDs Start: 01/25/23 0156 Code Status:   Code Status: Limited: Do not attempt resuscitation (DNR) -DNR-LIMITED -Do Not Intubate/DNI  Family Communication: plan of care discussed with patient/duaghter at bedside. Patient status is:  admitted as observation but remains hospitalized for ongoing  because of recurrent pleural effusion, acute on chronic anemia Level of care: Telemetry   Dispo: The patient is from: home  Anticipated disposition: TBD Objective: Vitals last 24 hrs: Vitals:   01/25/23 0159 01/25/23 0538 01/25/23 0644 01/25/23 1343  BP:  111/73  99/70  Pulse:  91  88  Resp:  20  18  Temp:  97.6 F (36.4 C)  97.7 F (36.5 C)  TempSrc:  Oral  Oral  SpO2:  95%  95%  Weight: 74.2 kg  74.2 kg   Height:  5\' 6"  (1.676 m)      Weight change:   Physical Examination: General exam: alert awake, older than stated age HEENT:Oral mucosa moist, Ear/Nose WNL grossly Respiratory system: bilaterally clear BS, no use of accessory muscle Cardiovascular system: S1 & S2 +, No JVD. Gastrointestinal system: Abdomen soft,NT,ND, BS+ Nervous System:Alert, awake, moving extremities. Extremities: LE edema neg,distal peripheral pulses palpable.  Skin: No rashes,no icterus. MSK: Normal muscle bulk,tone, power  Medications reviewed:  Scheduled Meds:  carvedilol  18.75 mg Oral BID   dapagliflozin propanediol  10 mg Oral Daily   furosemide  80 mg Oral Daily   glipiZIDE  10 mg Oral BID WC   [START ON 01/26/2023] influenza vac split trivalent PF  0.5 mL Intramuscular Tomorrow-1000   insulin aspart  0-5 Units Subcutaneous QHS   insulin aspart  0-9 Units Subcutaneous TID WC   lidocaine  1 patch Transdermal Q24H   linagliptin  5 mg Oral Daily   pantoprazole  40 mg Oral Daily   potassium chloride SA  20 mEq Oral Daily   spironolactone  25 mg Oral Daily   Continuous Infusions:    Diet Order             Diet heart healthy/carb modified Room service appropriate? Yes; Fluid consistency: Thin; Fluid restriction: 1500 mL Fluid  Diet effective now                  Intake/Output Summary (Last 24 hours) at 01/25/2023 1404 Last data filed at 01/25/2023 1145 Gross per 24 hour  Intake 600 ml  Output 1950 ml  Net -1350 ml   Net IO Since Admission: -1,350 mL [01/25/23 1404]  Wt Readings from Last 3 Encounters:  01/25/23 74.2 kg  11/02/22 76.1 kg  03/22/22 84.9 kg     Unresulted Labs (From admission, onward)     Start     Ordered   01/26/23 0500  CBC  Daily,   R      01/25/23 0921   01/26/23 0500  Basic metabolic panel  Daily,   R      01/25/23 0921   01/25/23 1900  Hemoglobin and hematocrit, blood  Once-Timed,   TIMED        01/25/23 0921   01/25/23 0921  Vitamin B12  (Anemia Panel (PNL))  Add-on,   AD         01/25/23 0921   01/25/23 0921  Folate  (Anemia Panel (PNL))  Add-on,   AD        01/25/23 0921   01/25/23 0921  Iron and TIBC  (Anemia Panel (PNL))  Add-on,   AD        01/25/23 0921   01/25/23 0921  Ferritin  (Anemia Panel (PNL))  Add-on,   AD        01/25/23 3614          Data Reviewed: I have personally reviewed following labs and imaging studies CBC: Recent Labs  Lab 01/24/23 1917 01/25/23 0512 01/25/23 0746  WBC 5.8 3.5*  --   NEUTROABS 3.6 2.2  --  HGB 9.5* 7.4* 7.4*  HCT 31.9* 24.9* 25.0*  MCV 79.8* 79.8*  --   PLT 163 109*  --    Basic Metabolic Panel: Recent Labs  Lab 01/24/23 1917 01/25/23 0512  NA 133* 131*  K 3.5 3.5  CL 99 96*  CO2 24 27  GLUCOSE 264* 330*  BUN 12 13  CREATININE 0.72 0.80  CALCIUM 8.7* 8.1*  MG  --  1.7   GFR: Estimated Creatinine Clearance: 88.6 mL/min (by C-G formula based on SCr of 0.8 mg/dL). Liver Function Tests: Recent Labs  Lab 01/24/23 1917 01/25/23 0512  AST 35 25  ALT 24 18  ALKPHOS 127* 94  BILITOT 0.6 0.9  PROT 6.6 5.8*  ALBUMIN 3.0* 3.0*  HbA1C: Recent Labs    01/25/23 0512  HGBA1C 10.0*   CBG: Recent Labs  Lab 01/25/23 0729 01/25/23 1135  GLUCAP 281* 87   Recent Results (from the past 240 hour(s))  Body fluid culture w Gram Stain     Status: None (Preliminary result)   Collection Time: 01/24/23 10:10 PM   Specimen: Pleural Fluid  Result Value Ref Range Status   Specimen Description   Final    PLEURAL Performed at [facility], [address], [place], [place] [address]    Special Requests   Final    NONE Performed at [facility], [address] [place], [place] [address]    Gram Stain   Final    RARE WBC PRESENT, PREDOMINANTLY MONONUCLEAR NO ORGANISMS SEEN Performed at [facility], [address] [place], [place] [address]    Culture PENDING  Incomplete   Report Status PENDING  Incomplete    Antimicrobials: Anti-infectives (From  admission, onward)    None      Culture/Microbiology    Component Value Date/Time   SDES  01/24/2023 2210    PLEURAL Performed at [facility], [address], [place], [place] [address]    SPECREQUEST  01/24/2023 2210    NONE Performed at [facility], [address], [place], [place] [address]    CULT PENDING 01/24/2023 2210   REPTSTATUS PENDING 01/24/2023 2210  Radiology Studies: DG CHEST PORT 1 VIEW  Result Date: 01/24/2023 CLINICAL DATA:  Post thoracentesis EXAM: PORTABLE CHEST 1 VIEW COMPARISON:  01/24/2023 FINDINGS: Cardiac pacemaker. Heart size and pulmonary vascularity are normal. Near complete evacuation of right pleural effusion post thoracentesis. No pneumothorax. Residual atelectasis or infiltration seen in the right lung base. Left lung is clear. IMPRESSION: Near complete evacuation of right pleural effusion post thoracentesis with residual atelectasis in the lung base. No pneumothorax. Electronically Signed   By: Burman Nieves M.D.   On: 01/24/2023 22:59   DG Chest Port 1 View  Result Date: 01/24/2023 CLINICAL DATA:  Increasing shortness of breath for 2 weeks. Peripheral edema. History of congestive heart failure. EXAM: PORTABLE CHEST 1 VIEW COMPARISON:  01/12/2023 FINDINGS: Cardiac pacemaker. Heart size is normal for technique. Interval development of a large right pleural effusion. Likely compressive atelectasis or consolidation in the right lung. No pneumothorax. Left lung is clear. IMPRESSION: Interval development of a large right pleural effusion since prior study. Electronically Signed   By: Burman Nieves M.D.   On: 01/24/2023 21:00     LOS: 0 days   Lanae Boast, MD [facility]  01/25/2023, 2:04 PM

## 2023-01-25 NOTE — Plan of Care (Signed)
  Problem: Pain Managment: Goal: General experience of comfort will improve Outcome: Progressing   Problem: Safety: Goal: Ability to remain free from injury will improve Outcome: Progressing   Problem: Skin Integrity: Goal: Risk for impaired skin integrity will decrease Outcome: Progressing   

## 2023-01-25 NOTE — Progress Notes (Addendum)
   Post-thoracentesis cxr shows near complete resolution of large right sided pleural effusion.  Pleural fluid analysis reveals that it is a transudate as expected.  Cytology pending.  Before Thoracentesis After Thoracentesis       Carollee Herter, DO [facility]

## 2023-01-25 NOTE — Inpatient Diabetes Management (Addendum)
Inpatient Diabetes Program Recommendations  AACE/ADA: New Consensus Statement on Inpatient Glycemic Control (2015)  Target Ranges:  Prepandial:   less than 140 mg/dL      Peak postprandial:   less than 180 mg/dL (1-2 hours)      Critically ill patients:  140 - 180 mg/dL   Lab Results  Component Value Date   GLUCAP 281 (H) 01/25/2023   HGBA1C 10.0 (H) 01/25/2023    Review of Glycemic Control  Diabetes history: DM2 Outpatient Diabetes medications: Tradjenta 5 every day, Farxiga 10 every day, glipizide 10 BID Current orders for Inpatient glycemic control: Novolog 0-9 TID with meals and 0-5 HS, tradjenta 5 every day, Farxiga 10 every day, glipizide 10 BID  HgbA1C - 10.0% FBS above goal this am  Inpatient Diabetes Program Recommendations:    Consider adding Semglee 10 units daily  Will follow while inpatient.  Thank you. Ailene Ards, RD, LDN, CDCES Inpatient Diabetes Coordinator [phone]  Addendum:  Spoke with pt at bedside regarding his HgbA1C of 10% (average blood sugar of 240 mg/dL) Pt states he admits to eating large quanities of fruit, drinks small amount of juice. We discussed eating variety of foods using plate method. Stressed importance of getting HgbA1C down to < 8%.  Pt states he knows what to do to get back on track. Explained how hyperglycemia leads to damage within blood vessels which lead to the common complications seen with uncontrolled diabetes. Stressed to the patient the importance of improving glycemic control to prevent further complications from uncontrolled diabetes. Discussed impact of nutrition, exercise, stress, sickness, and medications on diabetes control.  Continue to follow.  Thank you. Ailene Ards, RD, LDN, CDCES Inpatient Diabetes Coordinator [phone]

## 2023-01-25 NOTE — Hospital Course (Addendum)
62 yom w/ alcoholic cirrhosis with ascites, recurrent right pleural effusion/hepatic hydrothorax, history of hypertrophic cardiomyopathy, history of SVT status post ICD, type 2 diabetes, CKD stage III, portal hypertension presents to the ER today with worsening shortness of breath.  He had a 4 L thoracentesis at [facility] on September 12, 2022.  He states that his lungs are filling up with fluid again.  He drinks greater than 60 ounces of water a day.  He is on Lasix and Aldactone.  He follows up with cardiology and GI. IN HQ:IONG 97.9 heart rate 108 blood pressure 147/98 satting 98% on room air. WBC-5.8, hemoglobin 9.5, platelets 163 Sodium 133, potassium 3.5, bicarb 24, BUN of 12, creatinine 0.72, glucose of 264 BNP normal at 40.Total protein 6.6, albumin 3.0, AST 35, ALT 24, alk phos 127, total bili 0.6.Chest x-ray shows large right pleural effusion. [facility] hospitalist consulted.Underwent thoracentesis with removal of 4 L. Patient was given IV albumin monitored overnight Pleural fluid albumin less than 1.5 LDH 32 total nucleated cells 103,Gram stain no organism. GI consulted due to patient request for TIPS eval IR completed CT angio as well as echocardiogram preparation for TI PS although radiologically amenable J wanted to see if patient completely fails on medical management of the recurrent pleural effusion if so they will arrange for outpatient TIPS procedure. Due to drop in hemoglobin and occult positive GI plan for EGD: EGD showed esophageal varices,placed 4 bands. he had a colonoscopy in 04/2022, so colonoscopy was not repeated. He has a f/u with his GI in  in a few weeks.Ok to d/c home today as per GI on furosemide 40 mg and spironolactone 100 mg a day.

## 2023-01-25 NOTE — ED Notes (Signed)
Pt resting quietly at this time and in no acute distress. Pt is tolerating albumin administration well. No complaints at this time. Albumin came in four 12.5 gram vials from pyxis. Verified with pharmacy prior to administration that patient was to receive four vials (total of 50g) each at a rate of 60 ml/hr. Pt is receiving the fourth and final vial at this time.

## 2023-01-25 NOTE — ED Notes (Signed)
ED TO INPATIENT HANDOFF REPORT  ED Nurse Name and Phone #: Claiborne Rigg Name/Age/Gender Connor Vaughn 62 y.o. male Room/Bed: WA23/WA23  Code Status   Code Status: Limited: Do not attempt resuscitation (DNR) -DNR-LIMITED -Do Not Intubate/DNI   Home/SNF/Other Home Patient oriented to: self, place, time, and situation Is this baseline? Yes   Triage Complete: Triage complete  Chief Complaint Recurrent right pleural effusion [J90]  Triage Note EMS reports from home, called out for increasing SOB x 2 weeks. Peripheral edema. Hx CHF.  BP 156/87 HR 100 RR 18 Sp02 96 RA CBG 348  20R forearm.   Allergies Allergies  Allergen Reactions   Latex Rash    Level of Care/Admitting Diagnosis ED Disposition     ED Disposition  Admit   Condition  --   Comment  Hospital Area: [facility] [[id number]]  Level of Care: Telemetry [5]  Admit to tele based on following criteria: Complex arrhythmia (Bradycardia/Tachycardia)  May place patient in observation at [facility] or [facility] if equivalent level of care is available:: No  Covid Evaluation: Asymptomatic - no recent exposure (last 10 days) testing not required  Diagnosis: Recurrent right pleural effusion [782956]  Admitting Physician: Imogene Burn, ERIC [[id number]]  Attending Physician: Imogene Burn, ERIC [[id number]]          B Medical/Surgery History Past Medical History:  Diagnosis Date   Alcohol abuse 12/24/2015   Cirrhosis (HCC)    Diabetes mellitus, type II (HCC)    DKA (diabetic ketoacidoses) 04/04/2019   H/O fracture    nasal x 2   Sleep apnea    UGIB (upper gastrointestinal bleed) 12/2019   Past Surgical History:  Procedure Laterality Date   ESOPHAGEAL BANDING  01/14/2020   Procedure: ESOPHAGEAL VARICES BANDING;  Surgeon: Jeani Hawking, MD;  Location: [facility];  Service: Endoscopy;;   ESOPHAGOGASTRODUODENOSCOPY N/A 01/13/2020   Procedure: ESOPHAGOGASTRODUODENOSCOPY (EGD);  Surgeon: Charna Elizabeth,  MD;  Location: [facility];  Service: Endoscopy;  Laterality: N/A;   ESOPHAGOGASTRODUODENOSCOPY (EGD) WITH PROPOFOL N/A 04/05/2019   Procedure: ESOPHAGOGASTRODUODENOSCOPY (EGD) WITH PROPOFOL;  Surgeon: Jeani Hawking, MD;  Location: [facility];  Service: Endoscopy;  Laterality: N/A;   ESOPHAGOGASTRODUODENOSCOPY (EGD) WITH PROPOFOL N/A 01/14/2020   Procedure: ESOPHAGOGASTRODUODENOSCOPY (EGD) WITH PROPOFOL;  Surgeon: Jeani Hawking, MD;  Location: [facility];  Service: Endoscopy;  Laterality: N/A;   HERNIA REPAIR     Age 32   ICD IMPLANT N/A 05/18/2020   Procedure: ICD IMPLANT;  Surgeon: Marinus Maw, MD;  Location: [facility] INVASIVE CV LAB;  Service: Cardiovascular;  Laterality: N/A;   LUMBAR LAMINECTOMY/DECOMPRESSION MICRODISCECTOMY N/A 12/15/2019   Procedure: LUMBAR THREE-SACRAL ONE LAMINECTOMY WITH LUMBAR FOUR-FIVE DISCECTOMY ;  Surgeon: Bethann Goo, DO;  Location: [facility] OR;  Service: Neurosurgery;  Laterality: N/A;   thoracocenteis       A IV Location/Drains/Wounds Patient Lines/Drains/Airways Status     Active Line/Drains/Airways     Name Placement date Placement time Site Days   Peripheral IV 01/24/23 20 G Anterior;Right Forearm 01/24/23  1843  Forearm  1   Incision (Closed) 12/15/19 Back 12/15/19  1129  -- 1137            Intake/Output Last 24 hours No intake or output data in the 24 hours ending 01/25/23 0019  Labs/Imaging Results for orders placed or performed during the hospital encounter of 01/24/23 (from the past 48 hour(s))  CBC with Differential/Platelet     Status: Abnormal   Collection Time: 01/24/23  7:17  PM  Result Value Ref Range   WBC 5.8 4.0 - 10.5 K/uL   RBC 4.00 (L) 4.22 - 5.81 MIL/uL   Hemoglobin 9.5 (L) 13.0 - 17.0 g/dL   HCT 16.1 (L) 09.6 - 04.5 %   MCV 79.8 (L) 80.0 - 100.0 fL   MCH 23.8 (L) 26.0 - 34.0 pg   MCHC 29.8 (L) 30.0 - 36.0 g/dL   RDW 40.9 81.1 - 91.4 %   Platelets 163 150 - 400 K/uL   nRBC 0.0 0.0 - 0.2 %   Neutrophils Relative % 63 %    Neutro Abs 3.6 1.7 - 7.7 K/uL   Lymphocytes Relative 21 %   Lymphs Abs 1.2 0.7 - 4.0 K/uL   Monocytes Relative 14 %   Monocytes Absolute 0.8 0.1 - 1.0 K/uL   Eosinophils Relative 1 %   Eosinophils Absolute 0.1 0.0 - 0.5 K/uL   Basophils Relative 1 %   Basophils Absolute 0.1 0.0 - 0.1 K/uL   Immature Granulocytes 0 %   Abs Immature Granulocytes 0.02 0.00 - 0.07 K/uL    Comment: Performed at [facility], [address] [place], [place] [address]  Brain natriuretic peptide     Status: None   Collection Time: 01/24/23  7:17 PM  Result Value Ref Range   B Natriuretic Peptide 40.0 0.0 - 100.0 pg/mL    Comment: Performed at [facility], [address] [place], [place] [address]  Basic metabolic panel     Status: Abnormal   Collection Time: 01/24/23  7:17 PM  Result Value Ref Range   Sodium 133 (L) 135 - 145 mmol/L   Potassium 3.5 3.5 - 5.1 mmol/L   Chloride 99 98 - 111 mmol/L   CO2 24 22 - 32 mmol/L   Glucose, Bld 264 (H) 70 - 99 mg/dL    Comment: Glucose reference range applies only to samples taken after fasting for at least 8 hours.   BUN 12 6 - 20 mg/dL   Creatinine, Ser 8.65 0.61 - 1.24 mg/dL   Calcium 8.7 (L) 8.9 - 10.3 mg/dL   GFR, Estimated >78 >46 mL/min    Comment: (NOTE) Calculated using the CKD-EPI Creatinine Equation (2021)    Anion gap 10 5 - 15    Comment: Performed at [facility], [address] [place], [place] [address]  Hepatic function panel     Status: Abnormal   Collection Time: 01/24/23  7:17 PM  Result Value Ref Range   Total Protein 6.6 6.5 - 8.1 g/dL   Albumin 3.0 (L) 3.5 - 5.0 g/dL   AST 35 15 - 41 U/L   ALT 24 0 - 44 U/L   Alkaline Phosphatase 127 (H) 38 - 126 U/L   Total Bilirubin 0.6 0.3 - 1.2 mg/dL   Bilirubin, Direct 0.1 0.0 - 0.2 mg/dL   Indirect Bilirubin 0.5 0.3 - 0.9 mg/dL    Comment: Performed at [facility], [address] [place], [place] [address]   Albumin, pleural or peritoneal fluid      Status: None   Collection Time: 01/24/23 10:10 PM  Result Value Ref Range   Albumin, Fluid <1.5 g/dL    Comment: (NOTE) No normal range established for this test Results should be evaluated in conjunction with serum values    Fluid Type-FALB PLEURAL     Comment: Performed at [facility], [address], [place], [place] [address]  Body fluid cell  count with differential     Status: Abnormal   Collection Time: 01/24/23 10:10 PM  Result Value Ref Range   Fluid Type-FCT PLEURAL    Color, Fluid STRAW (A) YELLOW   Appearance, Fluid CLEAR CLEAR   Total Nucleated Cell Count, Fluid 103 0 - 1,000 cu mm   Neutrophil Count, Fluid 6 0 - 25 %   Lymphs, Fluid 42 %   Monocyte-Macrophage-Serous Fluid 52 50 - 90 %   Eos, Fluid 0 %   Other Cells, Fluid OTHER CELLS IDENTIFIED AS MESOTHELIAL CELLS %    Comment: CORRELATE WITH CYTOLOGY. Performed at [facility], [address], [place], [place] [address]   Glucose, pleural or peritoneal fluid     Status: None   Collection Time: 01/24/23 10:10 PM  Result Value Ref Range   Glucose, Fluid 286 mg/dL    Comment: (NOTE) No normal range established for this test Results should be evaluated in conjunction with serum values    Fluid Type-FGLU PLEURAL     Comment: Performed at [facility], [address], [place], [place] [address]  Lactate dehydrogenase (pleural or peritoneal fluid)     Status: Abnormal   Collection Time: 01/24/23 10:10 PM  Result Value Ref Range   LD, Fluid 32 (H) 3 - 23 U/L    Comment: (NOTE) Results should be evaluated in conjunction with serum values    Fluid Type-FLDH PLEURAL     Comment: Performed at [facility], [address], [place], [place] [address]  Protein, pleural or peritoneal fluid     Status: None   Collection Time: 01/24/23 10:10 PM  Result Value Ref Range   Total protein, fluid <3.0 g/dL     Comment: (NOTE) No normal range established for this test Results should be evaluated in conjunction with serum values    Fluid Type-FTP PLEURAL     Comment: Performed at [facility], [address], [place], [place] [address]   DG CHEST PORT 1 VIEW  Result Date: 01/24/2023 CLINICAL DATA:  Post thoracentesis EXAM: PORTABLE CHEST 1 VIEW COMPARISON:  01/24/2023 FINDINGS: Cardiac pacemaker. Heart size and pulmonary vascularity are normal. Near complete evacuation of right pleural effusion post thoracentesis. No pneumothorax. Residual atelectasis or infiltration seen in the right lung base. Left lung is clear. IMPRESSION: Near complete evacuation of right pleural effusion post thoracentesis with residual atelectasis in the lung base. No pneumothorax. Electronically Signed   By: Burman Nieves M.D.   On: 01/24/2023 22:59   DG Chest Port 1 View  Result Date: 01/24/2023 CLINICAL DATA:  Increasing shortness of breath for 2 weeks. Peripheral edema. History of congestive heart failure. EXAM: PORTABLE CHEST 1 VIEW COMPARISON:  01/12/2023 FINDINGS: Cardiac pacemaker. Heart size is normal for technique. Interval development of a large right pleural effusion. Likely compressive atelectasis or consolidation in the right lung. No pneumothorax. Left lung is clear. IMPRESSION: Interval development of a large right pleural effusion since prior study. Electronically Signed   By: Burman Nieves M.D.   On: 01/24/2023 21:00    Pending Labs Unresulted Labs (From admission, onward)     Start     Ordered   01/24/23 2214  Body fluid culture w Gram Stain  (Thoracentesis Labs Panel)  Once,   URGENT       Question:  Are there also cytology or pathology orders on this specimen?  Answer:  Yes   01/24/23 2214   Signed and Held  HIV Antibody (routine testing w rflx)  (HIV Antibody (Routine testing w reflex) panel)  Add-on,   R        Signed and Held   Signed and Held  Comprehensive metabolic panel   Tomorrow morning,   R        Signed and Held   Signed and Held  CBC with Differential/Platelet  Tomorrow morning,   R        Signed and Held   Signed and Held  Magnesium  Tomorrow morning,   R        Signed and Held   Signed and Held  Hemoglobin A1c  Once,   R       Comments: To assess prior glycemic control    Signed and Held            Vitals/Pain Today's Vitals   01/24/23 2315 01/24/23 2330 01/25/23 0000 01/25/23 0002  BP:  (!) 159/99 (!) 138/96   Pulse: (!) 109 (!) 114 (!) 111   Resp: 12 16 12    Temp:      TempSrc:      SpO2: 94% 95% 95%   PainSc:    6     Isolation Precautions No active isolations  Medications Medications  0.9 %  sodium chloride infusion (has no administration in time range)  HYDROmorphone (DILAUDID) injection 1 mg (1 mg Intravenous Given 01/24/23 2209)  albumin human 25 % solution 50 g (50 g Intravenous New Bag/Given 01/24/23 2239)    Mobility walks     Focused Assessments Pulmonary Assessment Handoff:  Lung sounds: R Breath Sounds: Diminished O2 Device: Nasal Cannula O2 Flow Rate (L/min): 2 L/min    R Recommendations: See Admitting Provider Note  Report given to:   Additional Notes: pt had bedside right sided thoracentesis in ED. Site is intact. Pt has been laying flat. Pt is on 2L nasal cannula bc his O2 sat dropped to 89%. Pt had over 4 Liters removed. Albumin is infusing at this time.

## 2023-01-25 NOTE — ED Notes (Signed)
Thoracentesis site is still intact. Pt c/o right upper chest/shoulder pain. Dr. Imogene Burn notified. Medicated per MAR.

## 2023-01-25 NOTE — Consult Note (Signed)
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 male.  HPI: Connor Vaughn is a 62 year old white male with a longstanding history of alcohol abuse with ascites and recurrent right pleural effusions complicated by history of hypertrophic cardiomyopathy, SVT s/p ICD, Type 2 diabetes with portal hypertension and came to the ER with worsening shortness of breath. He last saw Dr. Jeani Hawking in his office in 2018 and had an EGD done by him on 01/14/2020 for hematemesis when 7 bands were placed on large esophageal varices; LA grade D esophagitis was also noted along with portal hypertensive gastropathy with 10 mm nonbleeding duodenal ulcer. He was was subsequently lost to follow-up since then. Patient tells me he was at a Nursing home in [place] and was referred to another gastroenterologist there but cannot tell me who he saw for his ongoing care while he was at the nursing home.  He recently had thoracentesis with removal of 4 L pleural fluid on 09/12/2022. He is requesting a consultation for TIPS.  Past Medical History:  Diagnosis Date   Alcohol abuse/portal hypertension 12/24/2015   Cirrhosis with ascites/hepatic hydrothorax    Diabetes mellitus, type II (HCC)    DKA (diabetic ketoacidoses) 04/04/2019   H/O fracture    Hypertrophic cardiomyopathy   SVT s/p ICD impalnt   Sleep apnea    UGIB (upper gastrointestinal bleed) 12/2019   Past Surgical History:  Procedure Laterality Date   ESOPHAGEAL BANDING  01/14/2020   Procedure: ESOPHAGEAL VARICES BANDING;  Surgeon: Jeani Hawking, MD;  Location: [facility];  Service: Endoscopy;;   ESOPHAGOGASTRODUODENOSCOPY N/A 01/13/2020   Procedure: ESOPHAGOGASTRODUODENOSCOPY (EGD);  Surgeon: Charna Elizabeth, MD;  Location: [facility];  Service: Endoscopy;  Laterality: N/A;   ESOPHAGOGASTRODUODENOSCOPY (EGD) WITH PROPOFOL N/A 04/05/2019   Procedure:  ESOPHAGOGASTRODUODENOSCOPY (EGD) WITH PROPOFOL;  Surgeon: Jeani Hawking, MD;  Location: [facility];  Service: Endoscopy;  Laterality: N/A;   ESOPHAGOGASTRODUODENOSCOPY (EGD) WITH PROPOFOL N/A 01/14/2020   Procedure: ESOPHAGOGASTRODUODENOSCOPY (EGD) WITH PROPOFOL;  Surgeon: Jeani Hawking, MD;  Location: [facility];  Service: Endoscopy;  Laterality: N/A;   HERNIA REPAIR     Age 63   ICD IMPLANT N/A 05/18/2020   Procedure: ICD IMPLANT;  Surgeon: Marinus Maw, MD;  Location: [facility]ASIVE CV LAB;  Service: Cardiovascular;  Laterality: N/A;   LUMBAR LAMINECTOMY/DECOMPRESSION MICRODISCECTOMY N/A 12/15/2019   Procedure: LUMBAR THREE-SACRAL ONE LAMINECTOMY WITH LUMBAR FOUR-FIVE DISCECTOMY ;  Surgeon: Bethann Goo, DO;  Location: [facility] OR;  Service: Neurosurgery;  Laterality: N/A;   thoracocenteis     Family History  Problem Relation Age of Onset   Diabetes Mother    Heart failure Mother    Valvular heart disease Mother    Diabetes Mellitus II Father    Heart failure Father    Dementia Father    Seizures Neg Hx    Social History:  reports that he quit smoking about 14 years ago. His smoking use included cigarettes. He has never used smokeless tobacco. He reports current alcohol use. He reports that he does not use drugs.  Allergies:  Allergies  Allergen Reactions   Latex Rash   Medications: I have reviewed the patient's current medications. Prior to Admission:  Medications Prior to Admission  Medication Sig Dispense Refill Last Dose   albuterol (VENTOLIN HFA) 108 (90 Base) MCG/ACT inhaler Inhale into the lungs every 6 (six) hours as needed for wheezing or shortness of breath.  unknown   benzonatate (TESSALON) 100 MG capsule Take 100 mg by mouth 3 (three) times daily as needed for cough.   unknown   busPIRone (BUSPAR) 5 MG tablet Take 5 mg by mouth 2 (two) times daily.   01/24/2023   carboxymethylcellulose (REFRESH PLUS) 0.5 % SOLN Place 1 drop into both eyes 2 (two) times daily as needed.    unknown   carvedilol (COREG) 12.5 MG tablet Take 1.5 tablets (18.75 mg total) by mouth 2 (two) times daily. (Patient taking differently: Take 12.5 mg by mouth 2 (two) times daily.) 90 tablet 6 01/24/2023   dapagliflozin propanediol (FARXIGA) 10 MG TABS tablet Take 10 mg by mouth daily.   01/24/2023   DULoxetine (CYMBALTA) 30 MG capsule Take 30 mg by mouth every evening.   01/23/2023   DULoxetine (CYMBALTA) 60 MG capsule Take 60 mg by mouth in the morning.   01/24/2023   ferrous sulfate 325 (65 FE) MG EC tablet Take 325 mg by mouth daily with breakfast.   01/24/2023   fluticasone (FLONASE) 50 MCG/ACT nasal spray Place 1 spray into both nostrils daily as needed for allergies or rhinitis.   01/24/2023   furosemide (LASIX) 80 MG tablet Take 80 mg by mouth 2 (two) times daily.   01/24/2023   guaiFENesin-codeine (ROBITUSSIN AC) 100-10 MG/5ML syrup Take 5 mLs by mouth 3 (three) times daily as needed for cough.   unknown   hydrOXYzine (VISTARIL) 25 MG capsule Take 25 mg by mouth 3 (three) times daily as needed for anxiety.   unknown   ipratropium-albuterol (DUONEB) 0.5-2.5 (3) MG/3ML SOLN Take 3 mLs by nebulization every 6 (six) hours as needed.   unknown   lidocaine 4 % Place 1 patch onto the skin 2 (two) times daily as needed.   unknown   montelukast (SINGULAIR) 10 MG tablet Take 10 mg by mouth daily.   01/24/2023   ondansetron (ZOFRAN) 4 MG tablet Take 4 mg by mouth every 6 (six) hours as needed for nausea or vomiting.   unknown   pantoprazole (PROTONIX) 40 MG tablet Take 40 mg by mouth daily.   01/24/2023   sitaGLIPtin (JANUVIA) 50 MG tablet Take 50 mg by mouth daily.   01/24/2023   spironolactone (ALDACTONE) 100 MG tablet Take 200 mg by mouth 2 (two) times daily.   01/24/2023   Scheduled:  carvedilol  18.75 mg Oral BID   dapagliflozin propanediol  10 mg Oral Daily   furosemide  80 mg Oral Daily   glipiZIDE  10 mg Oral BID WC   [START ON 01/26/2023] influenza vac split trivalent PF  0.5 mL Intramuscular Tomorrow-1000    insulin aspart  0-5 Units Subcutaneous QHS   insulin aspart  0-9 Units Subcutaneous TID WC   lidocaine  1 patch Transdermal Q24H   linagliptin  5 mg Oral Daily   pantoprazole  40 mg Oral Daily   potassium chloride SA  20 mEq Oral Daily   spironolactone  25 mg Oral Daily   Continuous: OZH:[id number] **OR** acetaminophen, guaiFENesin-dextromethorphan, melatonin, ondansetron **OR** ondansetron (ZOFRAN) IV  Results for orders placed or performed during the hospital encounter of 01/24/23 (from the past 48 hour(s))  CBC with Differential/Platelet     Status: Abnormal   Collection Time: 01/24/23  7:17 PM  Result Value Ref Range   WBC 5.8 4.0 - 10.5 K/uL   RBC 4.00 (L) 4.22 - 5.81 MIL/uL   Hemoglobin 9.5 (L) 13.0 - 17.0 g/dL   HCT 28.4 (L) 13.2 -  52.0 %   MCV 79.8 (L) 80.0 - 100.0 fL   MCH 23.8 (L) 26.0 - 34.0 pg   MCHC 29.8 (L) 30.0 - 36.0 g/dL   RDW 29.5 28.4 - 13.2 %   Platelets 163 150 - 400 K/uL   nRBC 0.0 0.0 - 0.2 %   Neutrophils Relative % 63 %   Neutro Abs 3.6 1.7 - 7.7 K/uL   Lymphocytes Relative 21 %   Lymphs Abs 1.2 0.7 - 4.0 K/uL   Monocytes Relative 14 %   Monocytes Absolute 0.8 0.1 - 1.0 K/uL   Eosinophils Relative 1 %   Eosinophils Absolute 0.1 0.0 - 0.5 K/uL   Basophils Relative 1 %   Basophils Absolute 0.1 0.0 - 0.1 K/uL   Immature Granulocytes 0 %   Abs Immature Granulocytes 0.02 0.00 - 0.07 K/uL    Comment: Performed at [facility], [address], [place], [place] [address]  Brain natriuretic peptide     Status: None   Collection Time: 01/24/23  7:17 PM  Result Value Ref Range   B Natriuretic Peptide 40.0 0.0 - 100.0 pg/mL    Comment: Performed at [facility], [address], [place], [place] [address]  Basic metabolic panel     Status: Abnormal   Collection Time: 01/24/23  7:17 PM  Result Value Ref Range   Sodium 133 (L) 135 - 145 mmol/L   Potassium 3.5 3.5 - 5.1 mmol/L   Chloride 99 98 - 111 mmol/L   CO2 24 22  - 32 mmol/L   Glucose, Bld 264 (H) 70 - 99 mg/dL    Comment: Glucose reference range applies only to samples taken after fasting for at least 8 hours.   BUN 12 6 - 20 mg/dL   Creatinine, Ser 6.64 0.61 - 1.24 mg/dL   Calcium 8.7 (L) 8.9 - 10.3 mg/dL   GFR, Estimated >40 >34 mL/min    Comment: (NOTE) Calculated using the CKD-EPI Creatinine Equation (2021)    Anion gap 10 5 - 15    Comment: Performed at [facility], [address] [place], [place] [address]  Hepatic function panel     Status: Abnormal   Collection Time: 01/24/23  7:17 PM  Result Value Ref Range   Total Protein 6.6 6.5 - 8.1 g/dL   Albumin 3.0 (L) 3.5 - 5.0 g/dL   AST 35 15 - 41 U/L   ALT 24 0 - 44 U/L   Alkaline Phosphatase 127 (H) 38 - 126 U/L   Total Bilirubin 0.6 0.3 - 1.2 mg/dL   Bilirubin, Direct 0.1 0.0 - 0.2 mg/dL   Indirect Bilirubin 0.5 0.3 - 0.9 mg/dL    Comment: Performed at [facility], [address] [place], [place] [address]  Albumin, pleural or peritoneal fluid      Status: None   Collection Time: 01/24/23 10:10 PM  Result Value Ref Range   Albumin, Fluid <1.5 g/dL    Comment: (NOTE) No normal range established for this test Results should be evaluated in conjunction with serum values    Fluid Type-FALB PLEURAL     Comment: Performed at [facility], [address], [place], [place] [address]  Body fluid cell count with differential     Status: Abnormal   Collection Time: 01/24/23 10:10 PM  Result Value Ref Range   Fluid Type-FCT PLEURAL    Color, Fluid STRAW (A) YELLOW   Appearance, Fluid CLEAR CLEAR  Total Nucleated Cell Count, Fluid 103 0 - 1,000 cu mm   Neutrophil Count, Fluid 6 0 - 25 %   Lymphs, Fluid 42 %   Monocyte-Macrophage-Serous Fluid 52 50 - 90 %   Eos, Fluid 0 %   Other Cells, Fluid OTHER CELLS IDENTIFIED AS MESOTHELIAL CELLS %    Comment: CORRELATE WITH CYTOLOGY. Performed at [facility], [address], [place], [place] [address]   Glucose, pleural or peritoneal fluid     Status: None   Collection Time: 01/24/23 10:10 PM  Result Value Ref Range   Glucose, Fluid 286 mg/dL    Comment: (NOTE) No normal range established for this test Results should be evaluated in conjunction with serum values    Fluid Type-FGLU PLEURAL     Comment: Performed at [facility], [address] [place], [place] [address]  Lactate dehydrogenase (pleural or peritoneal fluid)     Status: Abnormal   Collection Time: 01/24/23 10:10 PM  Result Value Ref Range   LD, Fluid 32 (H) 3 - 23 U/L    Comment: (NOTE) Results should be evaluated in conjunction with serum values    Fluid Type-FLDH PLEURAL     Comment: Performed at [facility], [address], [place], [place] [address]  Protein, pleural or peritoneal fluid     Status: None   Collection Time: 01/24/23 10:10 PM  Result Value Ref Range   Total protein, fluid <3.0 g/dL    Comment: (NOTE) No normal range established for this test Results should be evaluated in conjunction with serum values    Fluid Type-FTP PLEURAL     Comment: Performed at [facility], [address], [place], [place] [address]  Body fluid culture w Gram Stain     Status: None (Preliminary result)   Collection Time: 01/24/23 10:10 PM   Specimen: Pleural Fluid  Result Value Ref Range   Specimen Description      PLEURAL Performed at [facility], [address] [place], [place] [address]    Special Requests      NONE Performed at [facility], [address] [place], [place] [address]    Gram Stain      RARE WBC PRESENT, PREDOMINANTLY MONONUCLEAR NO ORGANISMS SEEN Performed at [facility], [address], [place], [place] [address]    Culture PENDING    Report Status PENDING   HIV Antibody (routine testing w rflx)     Status: None   Collection Time: 01/25/23  5:12  AM  Result Value Ref Range   HIV Screen 4th Generation wRfx Non Reactive Non Reactive    Comment: Performed at [facility], [address], [place], [place] [address]  Comprehensive metabolic panel     Status: Abnormal   Collection Time: 01/25/23  5:12 AM  Result Value Ref Range   Sodium 131 (L) 135 - 145 mmol/L   Potassium 3.5 3.5 - 5.1 mmol/L   Chloride 96 (L) 98 - 111 mmol/L   CO2 27 22 - 32 mmol/L   Glucose, Bld 330 (H) 70 - 99 mg/dL    Comment: Glucose reference range applies only to samples taken after fasting for at least 8 hours.   BUN 13 6 - 20 mg/dL   Creatinine, Ser 2.84 0.61 - 1.24 mg/dL   Calcium 8.1 (L) 8.9 - 10.3 mg/dL   Total Protein 5.8 (L) 6.5 - 8.1 g/dL  Albumin 3.0 (L) 3.5 - 5.0 g/dL   AST 25 15 - 41 U/L   ALT 18 0 - 44 U/L   Alkaline Phosphatase 94 38 - 126 U/L   Total Bilirubin 0.9 0.3 - 1.2 mg/dL   GFR, Estimated >16 >10 mL/min    Comment: (NOTE) Calculated using the CKD-EPI Creatinine Equation (2021)    Anion gap 8 5 - 15    Comment: Performed at [facility], [address] [place], [place] [address]  CBC with Differential/Platelet     Status: Abnormal   Collection Time: 01/25/23  5:12 AM  Result Value Ref Range   WBC 3.5 (L) 4.0 - 10.5 K/uL   RBC 3.12 (L) 4.22 - 5.81 MIL/uL   Hemoglobin 7.4 (L) 13.0 - 17.0 g/dL   HCT 40.9 (L) 81.1 - 91.4 %   MCV 79.8 (L) 80.0 - 100.0 fL   MCH 23.7 (L) 26.0 - 34.0 pg   MCHC 29.7 (L) 30.0 - 36.0 g/dL   RDW 78.2 95.6 - 21.3 %   Platelets 109 (L) 150 - 400 K/uL    Comment: SPECIMEN CHECKED FOR CLOTS Immature Platelet Fraction may be clinically indicated, consider ordering this additional test [id number] REPEATED TO VERIFY    nRBC 0.0 0.0 - 0.2 %   Neutrophils Relative % 63 %   Neutro Abs 2.2 1.7 - 7.7 K/uL   Lymphocytes Relative 21 %   Lymphs Abs 0.7 0.7 - 4.0 K/uL   Monocytes Relative 13 %   Monocytes Absolute 0.5 0.1 - 1.0 K/uL   Eosinophils Relative 2 %   Eosinophils Absolute  0.1 0.0 - 0.5 K/uL   Basophils Relative 1 %   Basophils Absolute 0.0 0.0 - 0.1 K/uL   Immature Granulocytes 0 %   Abs Immature Granulocytes 0.01 0.00 - 0.07 K/uL    Comment: Performed at [facility], [address] [place], [place] [address]  Magnesium     Status: None   Collection Time: 01/25/23  5:12 AM  Result Value Ref Range   Magnesium 1.7 1.7 - 2.4 mg/dL    Comment: Performed at [facility], [address], [place], [place] [address]  Hemoglobin A1c     Status: Abnormal   Collection Time: 01/25/23  5:12 AM  Result Value Ref Range   Hgb A1c MFr Bld 10.0 (H) 4.8 - 5.6 %    Comment: (NOTE) Pre diabetes:          5.7%-6.4%  Diabetes:              >6.4%  Glycemic control for   <7.0% adults with diabetes    Mean Plasma Glucose 240.3 mg/dL    Comment: Performed at [facility], [address], [place], [place] [address]  Reticulocytes     Status: Abnormal   Collection Time: 01/25/23  5:12 AM  Result Value Ref Range   Retic Ct Pct 3.0 0.4 - 3.1 %   RBC. 3.06 (L) 4.22 - 5.81 MIL/uL   Retic Count, Absolute 92.1 19.0 - 186.0 K/uL   Immature Retic Fract 30.2 (H) 2.3 - 15.9 %    Comment: Performed at [facility], [address], [place], [place] [address]  Glucose, capillary     Status: Abnormal   Collection Time: 01/25/23  7:29 AM  Result Value Ref Range   Glucose-Capillary 281 (H) 70 - 99 mg/dL    Comment: Glucose reference range applies only to samples  taken after fasting for at least 8 hours.  Hemoglobin and hematocrit, blood     Status: Abnormal   Collection Time: 01/25/23  7:46 AM  Result Value Ref Range   Hemoglobin 7.4 (L) 13.0 - 17.0 g/dL   HCT 40.9 (L) 81.1 - 91.4 %    Comment: Performed at [facility], [address], [place], [place] [address]  Glucose, capillary     Status: None   Collection Time: 01/25/23 11:35 AM  Result Value Ref Range   Glucose-Capillary 87 70 - 99 mg/dL     Comment: Glucose reference range applies only to samples taken after fasting for at least 8 hours.    DG CHEST PORT 1 VIEW  Result Date: 01/24/2023 CLINICAL DATA:  Post thoracentesis EXAM: PORTABLE CHEST 1 VIEW COMPARISON:  01/24/2023 FINDINGS: Cardiac pacemaker. Heart size and pulmonary vascularity are normal. Near complete evacuation of right pleural effusion post thoracentesis. No pneumothorax. Residual atelectasis or infiltration seen in the right lung base. Left lung is clear. IMPRESSION: Near complete evacuation of right pleural effusion post thoracentesis with residual atelectasis in the lung base. No pneumothorax. Electronically Signed   By: Burman Nieves M.D.   On: 01/24/2023 22:59   DG Chest Port 1 View  Result Date: 01/24/2023 CLINICAL DATA:  Increasing shortness of breath for 2 weeks. Peripheral edema. History of congestive heart failure. EXAM: PORTABLE CHEST 1 VIEW COMPARISON:  01/12/2023 FINDINGS: Cardiac pacemaker. Heart size is normal for technique. Interval development of a large right pleural effusion. Likely compressive atelectasis or consolidation in the right lung. No pneumothorax. Left lung is clear. IMPRESSION: Interval development of a large right pleural effusion since prior study. Electronically Signed   By: Burman Nieves M.D.   On: 01/24/2023 21:00    Review of Systems  Constitutional: Negative.   HENT: Negative.    Eyes: Negative.   Respiratory:  Positive for cough and shortness of breath. Negative for choking.   Cardiovascular: Negative.   Gastrointestinal:  Positive for abdominal distention. Negative for abdominal pain, anal bleeding, blood in stool, constipation, diarrhea, nausea, rectal pain and vomiting.  Endocrine: Negative.   Musculoskeletal:  Positive for arthralgias.  Allergic/Immunologic: Negative.   Neurological:  Positive for weakness.  Hematological:  Bruises/bleeds easily.  Psychiatric/Behavioral:  The patient is nervous/anxious.    Blood  pressure 99/70, pulse 88, temperature 97.7 F (36.5 C), temperature source Oral, resp. rate 18, height 5\' 6"  (1.676 m), weight 74.2 kg, SpO2 95%. Physical Exam Constitutional:      General: He is not in acute distress.    Appearance: He is well-developed. He is not ill-appearing or toxic-appearing.  HENT:     Head: Normocephalic and atraumatic.     Mouth/Throat:     Pharynx: Oropharynx is clear.  Eyes:     Extraocular Movements: Extraocular movements intact.     Pupils: Pupils are equal, round, and reactive to light.  Cardiovascular:     Rate and Rhythm: Normal rate and regular rhythm.  Pulmonary:     Effort: Pulmonary effort is normal.     Breath sounds: Normal breath sounds.  Abdominal:     Palpations: Abdomen is soft. There is no splenomegaly.     Tenderness: There is no abdominal tenderness.  Musculoskeletal:     Cervical back: Normal range of motion and neck supple.     Right lower leg: No edema.     Left lower leg: No edema.  Skin:    General: Skin is warm  and dry.     Findings: Ecchymosis present.  Neurological:     Mental Status: He is alert and oriented to person, place, and time.   Assessment/Plan: 1) Alcoholic cirrhosis with ascites, portal hypertension/hepatic hydrothorax, esophageal varices status post banding in 2021-noncompliance with no follow-up with GI since 2021-patient is requesting consultation for TIPS-I do not think he is a candidate for TIPS but I will defer this to the IR.  2) Hypertrophic cardiomyopathy/SVT s/p ICD 3) AODM. 4) CKD-Stage 3a. 5) DNR/DNI. Charna Elizabeth 01/25/2023, 3:36 PM

## 2023-01-26 ENCOUNTER — Observation Stay (HOSPITAL_COMMUNITY): Payer: Medicaid Other

## 2023-01-26 ENCOUNTER — Observation Stay (HOSPITAL_BASED_OUTPATIENT_CLINIC_OR_DEPARTMENT_OTHER): Payer: Medicaid Other

## 2023-01-26 DIAGNOSIS — J90 Pleural effusion, not elsewhere classified: Secondary | ICD-10-CM | POA: Diagnosis not present

## 2023-01-26 DIAGNOSIS — I5030 Unspecified diastolic (congestive) heart failure: Secondary | ICD-10-CM | POA: Diagnosis not present

## 2023-01-26 LAB — BASIC METABOLIC PANEL
Anion gap: 9 (ref 5–15)
BUN: 13 mg/dL (ref 6–20)
CO2: 29 mmol/L (ref 22–32)
Calcium: 8.3 mg/dL — ABNORMAL LOW (ref 8.9–10.3)
Chloride: 96 mmol/L — ABNORMAL LOW (ref 98–111)
Creatinine, Ser: 0.87 mg/dL (ref 0.61–1.24)
GFR, Estimated: >60 mL/min (ref >60)
Glucose, Bld: 254 mg/dL — ABNORMAL HIGH (ref 70–99)
Potassium: 3.3 mmol/L — ABNORMAL LOW (ref 3.5–5.1)
Sodium: 134 mmol/L — ABNORMAL LOW (ref 135–145)

## 2023-01-26 LAB — GLUCOSE, CAPILLARY
Glucose-Capillary: 177 mg/dL — ABNORMAL HIGH (ref 70–99)
Glucose-Capillary: 177 mg/dL — ABNORMAL HIGH (ref 70–99)
Glucose-Capillary: 305 mg/dL — ABNORMAL HIGH (ref 70–99)
Glucose-Capillary: 135 mg/dL — ABNORMAL HIGH (ref 70–99)

## 2023-01-26 LAB — ECHOCARDIOGRAM COMPLETE
AR max vel: 1.48 cm2
AV Area VTI: 1.43 cm2
AV Area mean vel: 1.45 cm2
AV Mean grad: 20 mmHg
AV Peak grad: 36.1 mmHg
Ao pk vel: 3 m/s
Area-P 1/2: 4.35 cm2
Calc EF: 61.6 %
Est EF: >75
Height: 66 in
S' Lateral: 2.9 cm
Single Plane A2C EF: 58.7 %
Single Plane A4C EF: 62.8 %
Weight: 2550.28 [oz_av]

## 2023-01-26 LAB — CBC
HCT: 28.9 % — ABNORMAL LOW (ref 39.0–52.0)
Hemoglobin: 8.6 g/dL — ABNORMAL LOW (ref 13.0–17.0)
MCH: 24 pg — ABNORMAL LOW (ref 26.0–34.0)
MCHC: 29.8 g/dL — ABNORMAL LOW (ref 30.0–36.0)
MCV: 80.5 fL (ref 80.0–100.0)
Platelets: 102 10*3/uL — ABNORMAL LOW (ref 150–400)
RBC: 3.59 MIL/uL — ABNORMAL LOW (ref 4.22–5.81)
RDW: 14.6 % (ref 11.5–15.5)
WBC: 2.4 10*3/uL — ABNORMAL LOW (ref 4.0–10.5)
nRBC: 0 % (ref 0.0–0.2)

## 2023-01-26 LAB — PROTIME-INR
INR: 1.1 (ref 0.8–1.2)
Prothrombin Time: 14 s (ref 11.4–15.2)

## 2023-01-26 LAB — OCCULT BLOOD X 1 CARD TO LAB, STOOL: Fecal Occult Bld: POSITIVE — AB

## 2023-01-26 MED ORDER — IOHEXOL 350 MG/ML SOLN
100.0000 mL | Freq: Once | INTRAVENOUS | Status: AC | PRN
  Administered 2023-01-26: 100 mL via INTRAVENOUS

## 2023-01-26 MED ORDER — FUROSEMIDE 40 MG PO TABS
40.0000 mg | ORAL_TABLET | Freq: Every day | ORAL | Status: DC
  Administered 2023-01-28 – 2023-01-29 (×2): 40 mg via ORAL
  Filled 2023-01-26 (×3): qty 1

## 2023-01-26 MED ORDER — SPIRONOLACTONE 25 MG PO TABS
100.0000 mg | ORAL_TABLET | Freq: Every day | ORAL | Status: DC
  Administered 2023-01-28 – 2023-01-29 (×2): 100 mg via ORAL
  Filled 2023-01-26 (×3): qty 4

## 2023-01-26 MED ORDER — SODIUM CHLORIDE (PF) 0.9 % IJ SOLN
INTRAMUSCULAR | Status: AC
  Filled 2023-01-26: qty 50

## 2023-01-26 MED ORDER — SENNOSIDES-DOCUSATE SODIUM 8.6-50 MG PO TABS
1.0000 | ORAL_TABLET | Freq: Two times a day (BID) | ORAL | Status: DC
  Administered 2023-01-26 – 2023-01-29 (×7): 1 via ORAL
  Filled 2023-01-26 (×7): qty 1

## 2023-01-26 NOTE — Progress Notes (Signed)
  Echocardiogram 2D Echocardiogram has been performed.  Connor Vaughn 01/26/2023, 12:10 PM

## 2023-01-26 NOTE — Plan of Care (Signed)
  Problem: Coping: Goal: Level of anxiety will decrease Outcome: Progressing   Problem: Safety: Goal: Ability to remain free from injury will improve Outcome: Progressing   Problem: Skin Integrity: Goal: Risk for impaired skin integrity will decrease Outcome: Progressing   

## 2023-01-26 NOTE — Progress Notes (Signed)
PROGRESS NOTE Connor Vaughn  EXB:[id number] DOB: Sep 12, 1960 DOA: 01/24/2023 PCP: Ricky Stabs, NP-C  Brief Narrative/Hospital Course: 34 yom w/ alcoholic cirrhosis with ascites, recurrent right pleural effusion/hepatic hydrothorax, history of hypertrophic cardiomyopathy, history of SVT status post ICD, type 2 diabetes, CKD stage III, portal hypertension presents to the ER today with worsening shortness of breath.  He had a 4 L thoracentesis at [facility] on September 12, 2022.  He states that his lungs are filling up with fluid again.  He drinks greater than 60 ounces of water a day.  He is on Lasix and Aldactone.  He follows up with cardiology and GI. IN NU:UVOZ 97.9 heart rate 108 blood pressure 147/98 satting 98% on room air. WBC-5.8, hemoglobin 9.5, platelets 163 Sodium 133, potassium 3.5, bicarb 24, BUN of 12, creatinine 0.72, glucose of 264 BNP normal at 40.Total protein 6.6, albumin 3.0, AST 35, ALT 24, alk phos 127, total bili 0.6.Chest x-ray shows large right pleural effusion. [facility] consulted.Underwent thoracentesis with removal of 4 L. Patient was given IV albumin monitored overnight Pleural fluid albumin less than 1.5 LDH 32 total nucleated cells 103,Gram stain no organism. GI consulted due to patient request for TIPS eval   Subjective: Patient seen and examined this morning, he has been ambulating, complains of cough, on room air.     Assessment and Plan: Principal Problem:   Recurrent right pleural effusion/hepatic hydrothorax Active Problems:   Alcoholic cirrhosis of liver with ascites (HCC)   CKD stage 3a, GFR 45-59 ml/min (HCC)   DM (diabetes mellitus), type 2 with complications (HCC)   Hypertrophic cardiomyopathy (HCC)   ICD (implantable cardioverter-defibrillator) in place   Portal hypertension (HCC)   DNR (do not resuscitate)/DNI(Do Not Intubate)   Alcoholic cirrhosis of liver with ascites Recurrent right pleural effusion/hepatic  hydrothorax Underwent thoracentesis with removal of 4 L.Patient was given IV albumin.  Currently doing well on room air.  Patient reports he has had at least 4 thoracentesis this year and almost had  about 13 since his diagnosis. Pleural fluid albumin less than 1.5 LDH 32 total nucleated cells 103,Gram stain no organism.GI has been consulted and advised IR evaluation to consider for TI PS.  Echo ordered.  Acute on chronic anemia: Hemoglobin at baseline around 9.5 g downtrended to recent program now increasing anemia panel shows iron deficiency Denies obvious bleeding.  Reports he had colonoscopy with 2 polyps removed 6 months ago and also had EGD with banding.  Continue PPI GI following  Recent Labs    01/24/23 1917 01/25/23 0512 01/25/23 0746 01/25/23 1906 01/26/23 0452  HGB 9.5* 7.4* 7.4* 7.8* 8.6*  MCV 79.8* 79.8*  --   --  80.5  VITAMINB12  --   --   --  590  --   FOLATE  --   --   --  21.7  --   FERRITIN  --   --   --  6*  --   TIBC  --   --   --  367  --   IRON  --   --   --  13*  --   RETICCTPCT  --  3.0  --   --   --     Portal hypertension Continue coreg  bid.  Hypertrophic cardiomyopathy   ICD in place: Stable.Cardiac MRI from 03-2020 document HCOM.   T2DM:  Continue sliding scale insulin, glipizide and Januvia/farxiga, he has elevated Ahba1c at 10 Recent Labs  Lab 01/25/23 [phone]  01/25/23 0729 01/25/23 1135 01/25/23 1605 01/25/23 2040 01/26/23 0747  GLUCAP  --  281* 87 149* 182* 177*  HGBA1C 10.0*  --   --   --   --   --     CKD likely stage 2: his creatinine is at 0.8  DVT prophylaxis: SCDs Start: 01/25/23 0156 Code Status:   Code Status: Limited: Do not attempt resuscitation (DNR) -DNR-LIMITED -Do Not Intubate/DNI  Family Communication: plan of care discussed with patient/duaghter at bedside. Patient status is:  admitted as observation but remains hospitalized for ongoing recurrent pleural effusion and to consider for TI PS, changing to inpatient   Level of  care: Telemetry  Dispo: The patient is from: home            Anticipated disposition: TBD Objective: Vitals last 24 hrs: Vitals:   01/25/23 1343 01/25/23 1957 01/26/23 0435 01/26/23 0500  BP: 99/70 106/76 90/63   Pulse: 88 88 87   Resp: 18 18 18    Temp: 97.7 F (36.5 C) 97.9 F (36.6 C) 97.9 F (36.6 C)   TempSrc: Oral Oral Oral   SpO2: 95% 98% 93%   Weight:    72.3 kg  Height:       Weight change: -1.9 kg  Physical Examination: General exam: alert awake, oriented at baseline, older than stated age HEENT:Oral mucosa moist, Ear/Nose WNL grossly Respiratory system: Bilaterally clear BS,no use of accessory muscle Cardiovascular system: S1 & S2 +, No JVD. Gastrointestinal system: Abdomen soft,NT,ND, BS+ Nervous System: Alert, awake, moving all extremities,and following commands. Extremities: LE edema neg,distal peripheral pulses palpable and warm.  Skin: No rashes,no icterus. MSK: Normal muscle bulk,tone, power   Medications reviewed:  Scheduled Meds:  carvedilol  18.75 mg Oral BID   dapagliflozin propanediol  10 mg Oral Daily   furosemide  80 mg Oral Daily   glipiZIDE  10 mg Oral BID WC   insulin aspart  0-5 Units Subcutaneous QHS   insulin aspart  0-9 Units Subcutaneous TID WC   lidocaine  1 patch Transdermal Q24H   linagliptin  5 mg Oral Daily   pantoprazole  40 mg Oral Daily   potassium chloride SA  20 mEq Oral Daily   spironolactone  25 mg Oral Daily   Continuous Infusions:    Diet Order             Diet heart healthy/carb modified Room service appropriate? Yes; Fluid consistency: Thin; Fluid restriction: 1500 mL Fluid  Diet effective now                  Intake/Output Summary (Last 24 hours) at 01/26/2023 1026 Last data filed at 01/26/2023 0447 Gross per 24 hour  Intake 480 ml  Output 1625 ml  Net -1145 ml   Net IO Since Admission: -2,265 mL [01/26/23 1026]  Wt Readings from Last 3 Encounters:  01/26/23 72.3 kg  11/02/22 76.1 kg  03/22/22 84.9 kg      Unresulted Labs (From admission, onward)     Start     Ordered   01/26/23 0914  Protime-INR  Once,   R        01/26/23 0914   01/26/23 0500  CBC  Daily,   R      01/25/23 0921   01/26/23 0500  Basic metabolic panel  Daily,   R      01/25/23 4782          Data Reviewed: I have personally reviewed following labs and  imaging studies CBC: Recent Labs  Lab 01/24/23 1917 01/25/23 0512 01/25/23 0746 01/25/23 1906 01/26/23 0452  WBC 5.8 3.5*  --   --  2.4*  NEUTROABS 3.6 2.2  --   --   --   HGB 9.5* 7.4* 7.4* 7.8* 8.6*  HCT 31.9* 24.9* 25.0* 25.9* 28.9*  MCV 79.8* 79.8*  --   --  80.5  PLT 163 109*  --   --  102*   Basic Metabolic Panel: Recent Labs  Lab 01/24/23 1917 01/25/23 0512 01/26/23 0452  NA 133* 131* 134*  K 3.5 3.5 3.3*  CL 99 96* 96*  CO2 24 27 29   GLUCOSE 264* 330* 254*  BUN 12 13 13   CREATININE 0.72 0.80 0.87  CALCIUM 8.7* 8.1* 8.3*  MG  --  1.7  --    GFR: Estimated Creatinine Clearance: 81.5 mL/min (by C-G formula based on SCr of 0.87 mg/dL). Liver Function Tests: Recent Labs  Lab 01/24/23 1917 01/25/23 0512  AST 35 25  ALT 24 18  ALKPHOS 127* 94  BILITOT 0.6 0.9  PROT 6.6 5.8*  ALBUMIN 3.0* 3.0*  HbA1C: Recent Labs    01/25/23 0512  HGBA1C 10.0*   CBG: Recent Labs  Lab 01/25/23 0729 01/25/23 1135 01/25/23 1605 01/25/23 2040 01/26/23 0747  GLUCAP 281* 87 149* 182* 177*   Recent Results (from the past 240 hour(s))  Body fluid culture w Gram Stain     Status: None (Preliminary result)   Collection Time: 01/24/23 10:10 PM   Specimen: Pleural Fluid  Result Value Ref Range Status   Specimen Description   Final    PLEURAL Performed at [facility], [address], [place], [place] [address]    Special Requests   Final    NONE Performed at [facility], [address], [place], [place] [address]    Gram Stain   Final    RARE WBC PRESENT, PREDOMINANTLY MONONUCLEAR NO ORGANISMS SEEN     Culture   Final    NO GROWTH 1 DAY Performed at [facility], [address], [place], [place] [address]    Report Status PENDING  Incomplete    Antimicrobials: Anti-infectives (From admission, onward)    None      Culture/Microbiology    Component Value Date/Time   SDES  01/24/2023 2210    PLEURAL Performed at [facility], [address] [place], [place] [address]    SPECREQUEST  01/24/2023 2210    NONE Performed at [facility], [address], [place], [place] [address]    CULT  01/24/2023 2210    NO GROWTH 1 DAY Performed at [facility], [address] [place], [place] [address]    REPTSTATUS PENDING 01/24/2023 2210  Radiology Studies: DG CHEST PORT 1 VIEW  Result Date: 01/24/2023 CLINICAL DATA:  Post thoracentesis EXAM: PORTABLE CHEST 1 VIEW COMPARISON:  01/24/2023 FINDINGS: Cardiac pacemaker. Heart size and pulmonary vascularity are normal. Near complete evacuation of right pleural effusion post thoracentesis. No pneumothorax. Residual atelectasis or infiltration seen in the right lung base. Left lung is clear. IMPRESSION: Near complete evacuation of right pleural effusion post thoracentesis with residual atelectasis in the lung base. No pneumothorax. Electronically Signed   By: Burman Nieves M.D.   On: 01/24/2023 22:59   DG Chest Port 1 View  Result Date: 01/24/2023 CLINICAL DATA:  Increasing shortness of breath for 2 weeks. Peripheral edema. History of congestive heart  failure. EXAM: PORTABLE CHEST 1 VIEW COMPARISON:  01/12/2023 FINDINGS: Cardiac pacemaker. Heart size is normal for technique. Interval development of a large right pleural effusion. Likely compressive atelectasis or consolidation in the right lung. No pneumothorax. Left lung is clear. IMPRESSION: Interval development of a large right pleural effusion since prior study. Electronically Signed   By: Burman Nieves M.D.   On: 01/24/2023 21:00     LOS:  0 days   Lanae Boast, MD [facility]  01/26/2023, 10:26 AM

## 2023-01-26 NOTE — Consult Note (Signed)
Chief Complaint: Patient was seen in consultation today for hepatic hydrothorax  Referring Physician(s): Dr. Lanae Boast  Supervising Physician: Marliss Coots  Patient Status: [facility] - In-pt  History of Present Illness: Connor Vaughn is a 62 y.o. male with past medical history of poorly controlled DM, sleep apnea, back pain s/p spinal fusion, alcoholic cirrhosis with ongoing EtOH use, and esophgeal varices s/p banding ~2 years ago presents with shortness of breath, recurrent right pleural effusion.  Patient underwent thoracentesis at time of admission removing 4L of serous fluid.  IR consulted for possible TIPS procedure in the setting or recurrent hydrothorax.   PA to bedside this afternoon to discuss with patient and his wife.  He reports several year history of cirrhosis and associated complications including bleeding varices requiring banding, foggy mentation, and recurrent hydrothorax.  Patient reports he has had several thoracenteses, most recently at [facility] and that over the past few months he has seen a significant increase in volume of fluid removed from 1.5 L per session to now 4.0L removed. He is interested in TIPS should it be offered.   Past Medical History:  Diagnosis Date   Alcohol abuse 12/24/2015   Cirrhosis (HCC)    Diabetes mellitus, type II (HCC)    DKA (diabetic ketoacidoses) 04/04/2019   H/O fracture    nasal x 2   Sleep apnea    UGIB (upper gastrointestinal bleed) 12/2019    Past Surgical History:  Procedure Laterality Date   ESOPHAGEAL BANDING  01/14/2020   Procedure: ESOPHAGEAL VARICES BANDING;  Surgeon: Jeani Hawking, MD;  Location: [facility] ENDOSCOPY;  Service: Endoscopy;;   ESOPHAGOGASTRODUODENOSCOPY N/A 01/13/2020   Procedure: ESOPHAGOGASTRODUODENOSCOPY (EGD);  Surgeon: Charna Elizabeth, MD;  Location: [facility];  Service: Endoscopy;  Laterality: N/A;   ESOPHAGOGASTRODUODENOSCOPY (EGD) WITH PROPOFOL N/A 04/05/2019   Procedure:  ESOPHAGOGASTRODUODENOSCOPY (EGD) WITH PROPOFOL;  Surgeon: Jeani Hawking, MD;  Location: [facility] ENDOSCOPY;  Service: Endoscopy;  Laterality: N/A;   ESOPHAGOGASTRODUODENOSCOPY (EGD) WITH PROPOFOL N/A 01/14/2020   Procedure: ESOPHAGOGASTRODUODENOSCOPY (EGD) WITH PROPOFOL;  Surgeon: Jeani Hawking, MD;  Location: [facility] ENDOSCOPY;  Service: Endoscopy;  Laterality: N/A;   HERNIA REPAIR     Age 41   ICD IMPLANT N/A 05/18/2020   Procedure: ICD IMPLANT;  Surgeon: Marinus Maw, MD;  Location: [facility] INVASIVE CV LAB;  Service: Cardiovascular;  Laterality: N/A;   LUMBAR LAMINECTOMY/DECOMPRESSION MICRODISCECTOMY N/A 12/15/2019   Procedure: LUMBAR THREE-SACRAL ONE LAMINECTOMY WITH LUMBAR FOUR-FIVE DISCECTOMY ;  Surgeon: Bethann Goo, DO;  Location: [facility] OR;  Service: Neurosurgery;  Laterality: N/A;   thoracocenteis      Allergies: Latex  Medications: Prior to Admission medications   Medication Sig Start Date End Date Taking? Authorizing Provider  albuterol (VENTOLIN HFA) 108 (90 Base) MCG/ACT inhaler Inhale into the lungs every 6 (six) hours as needed for wheezing or shortness of breath.   Yes [provider]  benzonatate (TESSALON) 100 MG capsule Take 100 mg by mouth 3 (three) times daily as needed for cough.   Yes [provider]  busPIRone (BUSPAR) 5 MG tablet Take 5 mg by mouth 2 (two) times daily.   Yes [provider]  carboxymethylcellulose (REFRESH PLUS) 0.5 % SOLN Place 1 drop into both eyes 2 (two) times daily as needed.   Yes [provider]  carvedilol (COREG) 12.5 MG tablet Take 1.5 tablets (18.75 mg total) by mouth 2 (two) times daily. Patient taking differently: Take 12.5 mg by mouth 2 (two) times daily. 12/29/20  Yes  Graciella Freer, PA-C  dapagliflozin propanediol (FARXIGA) 10 MG TABS tablet Take 10 mg by mouth daily.   Yes [provider]  DULoxetine (CYMBALTA) 30 MG capsule Take 30 mg by mouth every evening.   Yes [provider]   DULoxetine (CYMBALTA) 60 MG capsule Take 60 mg by mouth in the morning.   Yes [provider]  ferrous sulfate 325 (65 FE) MG EC tablet Take 325 mg by mouth daily with breakfast.   Yes [provider]  fluticasone (FLONASE) 50 MCG/ACT nasal spray Place 1 spray into both nostrils daily as needed for allergies or rhinitis.   Yes [provider]  furosemide (LASIX) 80 MG tablet Take 80 mg by mouth 2 (two) times daily.   Yes [provider]  guaiFENesin-codeine (ROBITUSSIN AC) 100-10 MG/5ML syrup Take 5 mLs by mouth 3 (three) times daily as needed for cough.   Yes [provider]  hydrOXYzine (VISTARIL) 25 MG capsule Take 25 mg by mouth 3 (three) times daily as needed for anxiety.   Yes [provider]  ipratropium-albuterol (DUONEB) 0.5-2.5 (3) MG/3ML SOLN Take 3 mLs by nebulization every 6 (six) hours as needed.   Yes [provider]  lidocaine 4 % Place 1 patch onto the skin 2 (two) times daily as needed.   Yes [provider]  montelukast (SINGULAIR) 10 MG tablet Take 10 mg by mouth daily.   Yes [provider]  ondansetron (ZOFRAN) 4 MG tablet Take 4 mg by mouth every 6 (six) hours as needed for nausea or vomiting.   Yes [provider]  pantoprazole (PROTONIX) 40 MG tablet Take 40 mg by mouth daily.   Yes [provider]  sitaGLIPtin (JANUVIA) 50 MG tablet Take 50 mg by mouth daily.   Yes [provider]  spironolactone (ALDACTONE) 100 MG tablet Take 200 mg by mouth 2 (two) times daily.   Yes [provider]     Family History  Problem Relation Age of Onset   Diabetes Mother    Heart failure Mother    Valvular heart disease Mother    Diabetes Mellitus II Father    Heart failure Father    Dementia Father    Seizures Neg Hx     Social History   Socioeconomic History   Marital status: Married    Spouse name: Not on file   Number of children: 2   Years of education: Not  on file   Highest education level: High school graduate  Occupational History   Not on file  Tobacco Use   Smoking status: Former    Current packs/day: 0.00    Types: Cigarettes    Quit date: 2010    Years since quitting: 14.6   Smokeless tobacco: Never  Vaping Use   Vaping status: Never Used  Substance and Sexual Activity   Alcohol use: Yes    Comment: "pint every few days"   Drug use: No   Sexual activity: Yes  Other Topics Concern   Not on file  Social History Narrative   Lives at home with his wife   Right handed   No caffeine   Social Determinants of Health   Financial Resource Strain: Not on file  Food Insecurity: No Food Insecurity (01/25/2023)   Hunger Vital Sign    Worried About Running Out of Food in the Last Year: Never true    Ran Out of Food in the Last Year: Never true  Transportation Needs:  No Transportation Needs (01/25/2023)   PRAPARE - Administrator, Civil Service (Medical): No    Lack of Transportation (Non-Medical): No  Physical Activity: Not on file  Stress: Not on file  Social Connections: Not on file     Review of Systems: A 12 point ROS discussed and pertinent positives are indicated in the HPI above.  All other systems are negative.  Review of Systems  Constitutional:  Negative for fatigue and fever.  Respiratory:  Positive for cough and shortness of breath.   Cardiovascular:  Negative for chest pain.  Gastrointestinal:  Negative for abdominal pain, nausea and vomiting.  Genitourinary:  Positive for scrotal swelling.  Musculoskeletal:  Negative for back pain.  Psychiatric/Behavioral:  Negative for behavioral problems and confusion.     Vital Signs: BP 105/69 (BP Location: Right Arm)   Pulse 97   Temp 97.9 F (36.6 C) (Oral)   Resp 20   Ht 5\' 6"  (1.676 m)   Wt 159 lb 6.3 oz (72.3 kg)   SpO2 95%   BMI 25.73 kg/m   Physical Exam Vitals and nursing note reviewed.  Constitutional:      General: He is not in acute  distress.    Appearance: He is well-developed. He is not ill-appearing.  Cardiovascular:     Rate and Rhythm: Normal rate and regular rhythm.  Pulmonary:     Effort: Pulmonary effort is normal.     Breath sounds: Normal breath sounds.  Skin:    General: Skin is warm and dry.  Neurological:     General: No focal deficit present.     Mental Status: He is alert and oriented to person, place, and time.  Psychiatric:        Mood and Affect: Mood normal.        Behavior: Behavior normal.      Imaging: ECHOCARDIOGRAM COMPLETE  Result Date: 01/26/2023    ECHOCARDIOGRAM REPORT   Patient Name:   Connor Vaughn Date of Exam: 01/26/2023 Medical Rec #:  [id number]          Height:       66.0 in Accession #:    [id number]         Weight:       159.4 lb Date of Birth:  [birth year]          BSA:          1.816 m Patient Age:    62 years           BP:           90/63 mmHg Patient Gender: M                  HR:           88 bpm. Exam Location:  Inpatient Procedure: 2D Echo, 3D Echo, Cardiac Doppler and Color Doppler Indications:    I50.40* Unspecified combined systolic (congestive) and diastolic                 (congestive) heart failure  History:        Patient has prior history of Echocardiogram examinations, most                 recent 04/07/2020. Hypertrophic Cardiomyopathy, Abnormal ECG and                 Defibrillator, Aortic Valve Disease, Arrythmias:NSVT; Risk  Factors:Diabetes. ETOH.  Sonographer:    Sheralyn Boatman RDCS Referring Phys: [id number] [facility]  Sonographer Comments: Technically difficult study due to poor echo windows. Image acquisition challenging due to respiratory motion. Apical window extremely lateral near armpit. IMPRESSIONS  1. Left ventricular ejection fraction, by estimation, is >75%. The left ventricle has hyperdynamic function. The left ventricle has no regional wall motion abnormalities. There is severe left ventricular hypertrophy of the basal-septal segment. Left  ventricular diastolic parameters are consistent with Grade I diastolic dysfunction (impaired relaxation).  2. Right ventricular systolic function is normal. The right ventricular size is normal. Tricuspid regurgitation signal is inadequate for assessing PA pressure.  3. The mitral valve is myxomatous. No evidence of mitral valve regurgitation. No evidence of mitral stenosis. There is mild prolapse of posterior leaflet of the mitral valve.  4. The aortic valve is bicuspid. Aortic valve regurgitation is trivial. Moderate aortic valve stenosis.  5. 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 There is mild dilatation of the aortic root, measuring 41 mm. FINDINGS  Left Ventricle: Left ventricular ejection fraction, by estimation, is >75%. The left ventricle has hyperdynamic function. The left ventricle has no regional wall motion abnormalities. The left ventricular internal cavity size was small. There is severe left ventricular hypertrophy of the basal-septal segment. Left ventricular diastolic parameters are consistent with Grade I diastolic dysfunction (impaired relaxation). Right Ventricle: The right ventricular size is normal. Right ventricular systolic function is normal. Tricuspid regurgitation signal is inadequate for assessing PA pressure. The tricuspid regurgitant velocity is 1.76 m/s, and with an assumed right atrial  pressure of 3 mmHg, the estimated right ventricular systolic pressure is 15.4 mmHg. Left Atrium: Left atrial size was normal in size. Right Atrium: Right atrial size was normal in size. Pericardium: There is no evidence of pericardial effusion. Mitral Valve: The mitral valve is myxomatous. There is mild prolapse of posterior leaflet of the mitral valve. No evidence of mitral valve regurgitation. No evidence of mitral valve stenosis. Tricuspid Valve: The tricuspid valve is grossly normal. Tricuspid valve regurgitation is trivial. No evidence of tricuspid stenosis.  Aortic Valve: The aortic valve is bicuspid. Aortic valve regurgitation is trivial. Moderate aortic stenosis is present. Aortic valve mean gradient measures 20.0 mmHg. Aortic valve peak gradient measures 36.1 mmHg. Aortic valve area, by VTI measures 1.43 cm. Pulmonic Valve: The pulmonic valve was normal in structure. Pulmonic valve regurgitation is not visualized. No evidence of pulmonic stenosis. 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 There is mild dilatation of the aortic root, measuring 41 mm. IAS/Shunts: No atrial level shunt detected by color flow Doppler. Additional Comments: A device lead is visualized.  LEFT VENTRICLE PLAX 2D LVIDd:         4.60 cm     Diastology LVIDs:         2.90 cm     LV e' medial:    7.72 cm/s LV PW:         1.10 cm     LV E/e' medial:  11.7 LV IVS:        1.00 cm     LV e' lateral:   7.18 cm/s LVOT diam:     2.30 cm     LV E/e' lateral: 12.6 LV SV:         84 LV SV Index:   46 LVOT Area:     4.15 cm  LV Volumes (MOD) LV vol d, MOD A2C: 65.1 ml  LV vol d, MOD A4C: 69.0 ml LV vol s, MOD A2C: 26.9 ml LV vol s, MOD A4C: 25.7 ml LV SV MOD A2C:     38.2 ml LV SV MOD A4C:     69.0 ml LV SV MOD BP:      46.0 ml RIGHT VENTRICLE             IVC RV S prime:     12.60 cm/s  IVC diam: 0.90 cm TAPSE (M-mode): 2.6 cm LEFT ATRIUM             Index        RIGHT ATRIUM           Index LA diam:        2.80 cm 1.54 cm/m   RA Area:     10.20 cm LA Vol (A2C):   28.7 ml 15.80 ml/m  RA Volume:   20.70 ml  11.40 ml/m LA Vol (A4C):   24.2 ml 13.33 ml/m LA Biplane Vol: 26.7 ml 14.70 ml/m  AORTIC VALVE AV Area (Vmax):    1.48 cm AV Area (Vmean):   1.45 cm AV Area (VTI):     1.43 cm AV Vmax:           300.40 cm/s AV Vmean:          207.200 cm/s AV VTI:            0.591 m AV Peak Grad:      36.1 mmHg AV Mean Grad:      20.0 mmHg LVOT Vmax:         107.00 cm/s LVOT Vmean:        72.400 cm/s LVOT VTI:          0.203 m LVOT/AV VTI ratio: 0.34  AORTA Ao  Root diam: 4.10 cm Ao Asc diam:  3.55 cm MITRAL VALVE                TRICUSPID VALVE MV Area (PHT): 4.35 cm     TR Peak grad:   12.4 mmHg MV Decel Time: 175 msec     TR Vmax:        176.00 cm/s MV E velocity: 90.30 cm/s MV A velocity: 101.70 cm/s  SHUNTS MV E/A ratio:  0.89         Systemic VTI:  0.20 m                             Systemic Diam: 2.30 cm Olga Millers MD Electronically signed by Olga Millers MD Signature Date/Time: 01/26/2023/12:57:57 PM    Final    DG CHEST PORT 1 VIEW  Result Date: 01/24/2023 CLINICAL DATA:  Post thoracentesis EXAM: PORTABLE CHEST 1 VIEW COMPARISON:  01/24/2023 FINDINGS: Cardiac pacemaker. Heart size and pulmonary vascularity are normal. Near complete evacuation of right pleural effusion post thoracentesis. No pneumothorax. Residual atelectasis or infiltration seen in the right lung base. Left lung is clear. IMPRESSION: Near complete evacuation of right pleural effusion post thoracentesis with residual atelectasis in the lung base. No pneumothorax. Electronically Signed   By: Burman Nieves M.D.   On: 01/24/2023 22:59   DG Chest Port 1 View  Result Date: 01/24/2023 CLINICAL DATA:  Increasing shortness of breath for 2 weeks. Peripheral edema. History of congestive heart failure. EXAM: PORTABLE CHEST 1 VIEW COMPARISON:  01/12/2023 FINDINGS: Cardiac pacemaker. Heart size is normal for technique. Interval development of a large right pleural  effusion. Likely compressive atelectasis or consolidation in the right lung. No pneumothorax. Left lung is clear. IMPRESSION: Interval development of a large right pleural effusion since prior study. Electronically Signed   By: Burman Nieves M.D.   On: 01/24/2023 21:00    Labs:  CBC: Recent Labs    01/24/23 1917 01/25/23 0512 01/25/23 0746 01/25/23 1906 01/26/23 0452  WBC 5.8 3.5*  --   --  2.4*  HGB 9.5* 7.4* 7.4* 7.8* 8.6*  HCT 31.9* 24.9* 25.0* 25.9* 28.9*  PLT 163 109*  --   --  102*    COAGS: Recent Labs     01/26/23 1001  INR 1.1    BMP: Recent Labs    11/02/22 1157 01/24/23 1917 01/25/23 0512 01/26/23 0452  NA 136 133* 131* 134*  K 4.5 3.5 3.5 3.3*  CL 98 99 96* 96*  CO2 22 24 27 29   GLUCOSE 309* 264* 330* 254*  BUN 10 12 13 13   CALCIUM 9.2 8.7* 8.1* 8.3*  CREATININE 0.83 0.72 0.80 0.87  GFRNONAA  --  >60 >60 >60    LIVER FUNCTION TESTS: Recent Labs    01/24/23 1917 01/25/23 0512  BILITOT 0.6 0.9  AST 35 25  ALT 24 18  ALKPHOS 127* 94  PROT 6.6 5.8*  ALBUMIN 3.0* 3.0*    TUMOR MARKERS: No results for input(s): "AFPTM", "CEA", "CA199", "CHROMGRNA" in the last 8760 hours.  Assessment and Plan: Recurrent hydrothorax Alcoholic cirrhosis Patient admitted with recurrent hydrothorax s/p removal of 4L fluid on admission.  IR consulted for possible TIPS procedure.  CTA BRTO protocol, ECHO, and additional lab work obtained.  All reveal compensated hepatic cirrhosis with recurrent hydrothorax-- large volume fluid reaccumulated on CTA today.  ECHO with adequate heart function, no evidence of right heart failure.  Na 131, TBili 0.9, SCr 0.8, INR 1.1, albumin 3.0   MELD score: 13 FIPS score: -0.79 Child Pugh Class B  In review of case and anatomy by Dr. Elby Showers, patient would be a candidate for TIPS creation.  At this time, GI does not favor TIPS preferring to maximize medical management which is appropriate.   If TIPS desired in the future, patient would need to be transferred to [facility] and case would need to posted with anesthesia.  In the interim, will likely continue to require repeat thoracentesis. Fortunately, he does not require O2 at this time. States he is able to ambulate from the room, down the hallway and back, with mild nagging cough.  If TIPS to be considered, please reconsult IR.  Based on progress with GI after discharge, outpatient TIPS can also be considered.    Thank you for this interesting consult.  I greatly enjoyed meeting Connor Vaughn and look  forward to participating in their care.  A copy of this report was sent to the requesting provider on this date.  Electronically Signed: Hoyt Koch, PA 01/26/2023, 4:06 PM   I spent a total of 40 Minutes    in face to face in clinical consultation, greater than 50% of which was counseling/coordinating care for recurrent hydrothorax.

## 2023-01-26 NOTE — TOC CM/SW Note (Signed)
Transition of Care [facility]) - Inpatient Brief Assessment   Patient Details  Name: Connor Vaughn MRN: [id number] Date of Birth: Apr 08, 1961  Transition of Care [facility]) CM/SW Contact:    Howell Rucks, RN Phone Number: 01/26/2023, 11:03 AM   Clinical Narrative: Met with pt at bedside to introduce role of TOC/NCM and review for dc planning, pt reports he has a PCP, several specialist and pharmacy in place. Pt report he has no current home care services, reports he goes to [facility] 2x/wk, has a SW who assists and obtains his medications, reports he has a walker that he uses as needed. Pt reports his spouse or his daughter will provide transportation at discharge. TOC Brief Assessment completed. No TOC needs identified at this time.     Transition of Care Asessment: Insurance and Status: Insurance coverage has been reviewed Patient has primary care physician: Yes Home environment has been reviewed: return home with support from spouse/family Prior level of function:: Independent with walker as needed Prior/Current Home Services: No current home services Social Determinants of Health Reivew: SDOH reviewed no interventions necessary Readmission risk has been reviewed: Yes Transition of care needs: no transition of care needs at this time

## 2023-01-26 NOTE — Progress Notes (Signed)
Mobility Specialist - Progress Note   01/26/23 1317  Mobility  Activity Ambulated with assistance in hallway  Level of Assistance Modified independent, requires aide device or extra time  Assistive Device Front wheel walker  Distance Ambulated (ft) 480 ft  Activity Response Tolerated well  Mobility Referral Yes  $Mobility charge 1 Mobility  Mobility Specialist Start Time (ACUTE ONLY) 0107  Mobility Specialist Stop Time (ACUTE ONLY) 0116  Mobility Specialist Time Calculation (min) (ACUTE ONLY) 9 min   Pt received in bed and agreeable to mobility. No complaints during session. Pt to bed after session with all needs met. Bed alarm on.  [facility]

## 2023-01-26 NOTE — Progress Notes (Signed)
Subjective: No complaints.  Objective: Vital signs in last 24 hours: Temp:  [97.7 F (36.5 C)-97.9 F (36.6 C)] 97.9 F (36.6 C) (09/05 0435) Pulse Rate:  [87-88] 87 (09/05 0435) Resp:  [18] 18 (09/05 0435) BP: (90-106)/(63-76) 90/63 (09/05 0435) SpO2:  [93 %-98 %] 93 % (09/05 0435) Weight:  [72.3 kg] 72.3 kg (09/05 0500) Last BM Date : 01/24/23  Intake/Output from previous day: 09/04 0701 - 09/05 0700 In: 600 [P.O.:600] Out: 2025 [Urine:2025] Intake/Output this shift: No intake/output data recorded.  General appearance: alert and no distress GI: soft, non-tender; bowel sounds normal; no masses,  no organomegaly Pulm: Decreased breath sounds up to the mid portion of the right lung, left lung field is CTA  Lab Results: Recent Labs    01/24/23 1917 01/25/23 0512 01/25/23 0746 01/25/23 1906 01/26/23 0452  WBC 5.8 3.5*  --   --  2.4*  HGB 9.5* 7.4* 7.4* 7.8* 8.6*  HCT 31.9* 24.9* 25.0* 25.9* 28.9*  PLT 163 109*  --   --  102*   BMET Recent Labs    01/24/23 1917 01/25/23 0512 01/26/23 0452  NA 133* 131* 134*  K 3.5 3.5 3.3*  CL 99 96* 96*  CO2 24 27 29   GLUCOSE 264* 330* 254*  BUN 12 13 13   CREATININE 0.72 0.80 0.87  CALCIUM 8.7* 8.1* 8.3*   LFT Recent Labs    01/24/23 1917 01/25/23 0512  PROT 6.6 5.8*  ALBUMIN 3.0* 3.0*  AST 35 25  ALT 24 18  ALKPHOS 127* 94  BILITOT 0.6 0.9  BILIDIR 0.1  --   IBILI 0.5  --    PT/INR Recent Labs    01/26/23 1001  LABPROT 14.0  INR 1.1   Hepatitis Panel No results for input(s): "HEPBSAG", "HCVAB", "HEPAIGM", "HEPBIGM" in the last 72 hours. C-Diff No results for input(s): "CDIFFTOX" in the last 72 hours. Fecal Lactopherrin No results for input(s): "FECLLACTOFRN" in the last 72 hours.  Studies/Results: DG CHEST PORT 1 VIEW  Result Date: 01/24/2023 CLINICAL DATA:  Post thoracentesis EXAM: PORTABLE CHEST 1 VIEW COMPARISON:  01/24/2023 FINDINGS: Cardiac pacemaker. Heart size and pulmonary vascularity are  normal. Near complete evacuation of right pleural effusion post thoracentesis. No pneumothorax. Residual atelectasis or infiltration seen in the right lung base. Left lung is clear. IMPRESSION: Near complete evacuation of right pleural effusion post thoracentesis with residual atelectasis in the lung base. No pneumothorax. Electronically Signed   By: Burman Nieves M.D.   On: 01/24/2023 22:59   DG Chest Port 1 View  Result Date: 01/24/2023 CLINICAL DATA:  Increasing shortness of breath for 2 weeks. Peripheral edema. History of congestive heart failure. EXAM: PORTABLE CHEST 1 VIEW COMPARISON:  01/12/2023 FINDINGS: Cardiac pacemaker. Heart size is normal for technique. Interval development of a large right pleural effusion. Likely compressive atelectasis or consolidation in the right lung. No pneumothorax. Left lung is clear. IMPRESSION: Interval development of a large right pleural effusion since prior study. Electronically Signed   By: Burman Nieves M.D.   On: 01/24/2023 21:00    Medications: Scheduled:  carvedilol  18.75 mg Oral BID   dapagliflozin propanediol  10 mg Oral Daily   [START ON 01/27/2023] furosemide  40 mg Oral Daily   glipiZIDE  10 mg Oral BID WC   insulin aspart  0-5 Units Subcutaneous QHS   insulin aspart  0-9 Units Subcutaneous TID WC   lidocaine  1 patch Transdermal Q24H   linagliptin  5 mg Oral Daily  pantoprazole  40 mg Oral Daily   potassium chloride SA  20 mEq Oral Daily   senna-docusate  1 tablet Oral BID   [START ON 01/27/2023] spironolactone  100 mg Oral Daily   Continuous:  Assessment/Plan: 1) Hepatic hydrothorax. 2) Alcoholic cirrhosis.   The patient is NOT a candidate for TIPS at this time.  He is not on a <2,000 mg sodium diet.  The patient needs to be on this diet with concurrent diruetic treatment before considering TIPS.  Only when he fails medical management will he be considered for the intervention.  Also, in the past, he did respond very well to medical  management.  Plan: 1) 2 gram sodium diet. 2) Furosemide 40 mg every day and spironolactone 100 mg every day.  LOS: 0 days   Cassy Sprowl D 01/26/2023, 12:49 PM

## 2023-01-27 ENCOUNTER — Observation Stay (HOSPITAL_COMMUNITY): Payer: Medicaid Other

## 2023-01-27 DIAGNOSIS — J90 Pleural effusion, not elsewhere classified: Secondary | ICD-10-CM | POA: Diagnosis not present

## 2023-01-27 LAB — BASIC METABOLIC PANEL
Anion gap: 10 (ref 5–15)
BUN: 16 mg/dL (ref 6–20)
CO2: 27 mmol/L (ref 22–32)
Calcium: 8.3 mg/dL — ABNORMAL LOW (ref 8.9–10.3)
Chloride: 98 mmol/L (ref 98–111)
Creatinine, Ser: 0.99 mg/dL (ref 0.61–1.24)
GFR, Estimated: >60 mL/min (ref >60)
Glucose, Bld: 179 mg/dL — ABNORMAL HIGH (ref 70–99)
Potassium: 3.2 mmol/L — ABNORMAL LOW (ref 3.5–5.1)
Sodium: 135 mmol/L (ref 135–145)

## 2023-01-27 LAB — CBC
HCT: 26.1 % — ABNORMAL LOW (ref 39.0–52.0)
Hemoglobin: 8 g/dL — ABNORMAL LOW (ref 13.0–17.0)
MCH: 24.2 pg — ABNORMAL LOW (ref 26.0–34.0)
MCHC: 30.7 g/dL (ref 30.0–36.0)
MCV: 79.1 fL — ABNORMAL LOW (ref 80.0–100.0)
Platelets: 111 10*3/uL — ABNORMAL LOW (ref 150–400)
RBC: 3.3 MIL/uL — ABNORMAL LOW (ref 4.22–5.81)
RDW: 14.6 % (ref 11.5–15.5)
WBC: 2.8 10*3/uL — ABNORMAL LOW (ref 4.0–10.5)
nRBC: 0 % (ref 0.0–0.2)

## 2023-01-27 LAB — CYTOLOGY - NON PAP

## 2023-01-27 LAB — GLUCOSE, CAPILLARY
Glucose-Capillary: 156 mg/dL — ABNORMAL HIGH (ref 70–99)
Glucose-Capillary: 172 mg/dL — ABNORMAL HIGH (ref 70–99)
Glucose-Capillary: 195 mg/dL — ABNORMAL HIGH (ref 70–99)
Glucose-Capillary: 142 mg/dL — ABNORMAL HIGH (ref 70–99)

## 2023-01-27 MED ORDER — LIDOCAINE HCL 1 % IJ SOLN
INTRAMUSCULAR | Status: AC
  Filled 2023-01-27: qty 20

## 2023-01-27 MED ORDER — PEG 3350-KCL-NA BICARB-NACL 420 G PO SOLR: 4000.0000 mL | Freq: Once | ORAL | Status: DC

## 2023-01-27 MED ORDER — POTASSIUM CHLORIDE CRYS ER 20 MEQ PO TBCR
40.0000 meq | EXTENDED_RELEASE_TABLET | Freq: Once | ORAL | Status: AC
  Administered 2023-01-27: 40 meq via ORAL
  Filled 2023-01-27: qty 2

## 2023-01-27 MED ORDER — TRAMADOL HCL 50 MG PO TABS
50.0000 mg | ORAL_TABLET | Freq: Once | ORAL | Status: AC
  Administered 2023-01-27: 50 mg via ORAL
  Filled 2023-01-27: qty 1

## 2023-01-27 NOTE — Progress Notes (Signed)
Patient back from IR post thoracentesis,site band aide clean/dry/intact. Vitals WNL

## 2023-01-27 NOTE — Progress Notes (Signed)
Subjective: No complaints after the thoracentesis.  Objective: Vital signs in last 24 hours: Temp:  [98 F (36.7 C)] 98 F (36.7 C) (09/06 1249) Pulse Rate:  [88-93] 88 (09/06 1249) Resp:  [18-20] 18 (09/06 1249) BP: (96-121)/(66-83) 107/71 (09/06 1249) SpO2:  [95 %-97 %] 97 % (09/06 1249) Weight:  [78 kg] 78 kg (09/06 0500) Last BM Date : 01/26/23  Intake/Output from previous day: 09/05 0701 - 09/06 0700 In: 360 [P.O.:360] Out: 1575 [Urine:1575] Intake/Output this shift: Total I/O In: 240 [P.O.:240] Out: -   General appearance: alert and no distress GI: soft, non-tender; bowel sounds normal; no masses,  no organomegaly  Lab Results: Recent Labs    01/25/23 0512 01/25/23 0746 01/25/23 1906 01/26/23 0452 01/27/23 0505  WBC 3.5*  --   --  2.4* 2.8*  HGB 7.4*   < > 7.8* 8.6* 8.0*  HCT 24.9*   < > 25.9* 28.9* 26.1*  PLT 109*  --   --  102* 111*   < > = values in this interval not displayed.   BMET Recent Labs    01/25/23 0512 01/26/23 0452 01/27/23 0505  NA 131* 134* 135  K 3.5 3.3* 3.2*  CL 96* 96* 98  CO2 27 29 27   GLUCOSE 330* 254* 179*  BUN 13 13 16   CREATININE 0.80 0.87 0.99  CALCIUM 8.1* 8.3* 8.3*   LFT Recent Labs    01/24/23 1917 01/25/23 0512  PROT 6.6 5.8*  ALBUMIN 3.0* 3.0*  AST 35 25  ALT 24 18  ALKPHOS 127* 94  BILITOT 0.6 0.9  BILIDIR 0.1  --   IBILI 0.5  --    PT/INR Recent Labs    01/26/23 1001  LABPROT 14.0  INR 1.1   Hepatitis Panel No results for input(s): "HEPBSAG", "HCVAB", "HEPAIGM", "HEPBIGM" in the last 72 hours. C-Diff No results for input(s): "CDIFFTOX" in the last 72 hours. Fecal Lactopherrin No results for input(s): "FECLLACTOFRN" in the last 72 hours.  Studies/Results: US THORACENTESIS ASP PLEURAL SPACE W/IMG GUIDE  Result Date: 01/27/2023 INDICATION: 62 year old male inpatient. History of hepatic hydrothorax. Is requesting a therapeutic right-sided thoracentesis EXAM: ULTRASOUND GUIDED THERAPEUTIC  RIGHT-SIDED THORACENTESIS MEDICATIONS: Lidocaine 1% 10 mL COMPLICATIONS: None immediate. PROCEDURE: An ultrasound guided thoracentesis was thoroughly discussed with the patient and questions answered. The benefits, risks, alternatives and complications were also discussed. The patient understands and wishes to proceed with the procedure. Written consent was obtained. Ultrasound was performed to localize and mark an adequate pocket of fluid in the right chest. The area was then prepped and draped in the normal sterile fashion. 1% Lidocaine was used for local anesthesia. Under ultrasound guidance a 19 gauge, 7-cm, Yueh catheter was introduced. Thoracentesis was performed. The catheter was removed and a dressing applied. FINDINGS: A total of approximately 2.2 L of straw-colored fluid was removed. IMPRESSION: Successful ultrasound guided right-sided therapeutic thoracentesis yielding 2.2 L of pleural fluid. Read by: Anders Grant, NP Electronically Signed   By: Gilmer Mor D.O.   On: 01/27/2023 15:35   DG CHEST PORT 1 VIEW  Result Date: 01/27/2023 CLINICAL DATA:  Status post right-sided thoracentesis. EXAM: PORTABLE CHEST 1 VIEW COMPARISON:  One-view chest x-ray 01/24/2023 FINDINGS: No right-sided pneumothorax is present. A small amount of residual fluid or basilar atelectasis is present. The lung volumes are low. AICD wire is stable. Heart size is normal. IMPRESSION: 1. No right-sided pneumothorax following thoracentesis. 2. Small amount of residual fluid or basilar atelectasis. Electronically Signed   By:  Marin Roberts M.D.   On: 01/27/2023 12:58   CT Angio Abd/Pel w/ and/or w/o  Addendum Date: 01/26/2023   ADDENDUM REPORT: 01/26/2023 17:39 ADDENDUM: Main portal vein measures approximately 1.3 cm. There is a short right portal vein. The 3 main intrahepatic portal veins are the left portal vein, anterior branch of the right portal vein and posterior branch of the right portal vein. Electronically  Signed   By: Richarda Overlie M.D.   On: 01/26/2023 17:39   Result Date: 01/26/2023 CLINICAL DATA:  Recurrent right pleural effusion. Hepatic hydrothorax. Esophageal varices. EXAM: CTA ABDOMEN AND PELVIS WITHOUT AND WITH CONTRAST TECHNIQUE: Multidetector CT imaging of the abdomen and pelvis was performed using the standard protocol during bolus administration of intravenous contrast. Multiplanar reconstructed images and MIPs were obtained and reviewed to evaluate the vascular anatomy. RADIATION DOSE REDUCTION: This exam was performed according to the departmental dose-optimization program which includes automated exposure control, adjustment of the mA and/or kV according to patient size and/or use of iterative reconstruction technique. CONTRAST:  OMNIPAQUE IOHEXOL 350 MG/ML SOLN COMPARISON:  Chest CT abdomen and pelvis 12/15/2022 FINDINGS: VASCULAR Aorta: Atherosclerotic disease abdominal aorta without aneurysm, dissection or significant stenosis. Celiac: Patent without evidence of aneurysm, dissection, vasculitis or significant stenosis. SMA: Patent without evidence of aneurysm, dissection, vasculitis or significant stenosis. Renals: Both renal arteries are patent without evidence of aneurysm, dissection, vasculitis, fibromuscular dysplasia or significant stenosis. IMA: Patent Inflow: Atherosclerotic calcifications involving bilateral iliac arteries without aneurysm dissection or significant stenosis. Proximal Outflow: Proximal femoral arteries are patent bilaterally. Veins: Again noted are enlarged distal esophageal varices. Largest esophageal varices measures 1.5 cm in transverse dimension. The esophageal varices appear to be associated with the left gastric vein. Intrahepatic portal veins are patent. There are 3 main intrahepatic portal veins. Superior mesenteric vein and splenic vein are patent. Bilateral renal veins are patent. No significant gastro renal shunt. No significant gastric varices identified.  Review of the MIP images confirms the above findings. NON-VASCULAR Lower chest: Again noted is a large right pleural effusion with compressive atelectasis in the right lower lobe. Left lung base is clear. ICD lead extends into the right ventricle. Calcifications at the aortic valve. Hepatobiliary: Liver is diffusely nodular and compatible with cirrhosis. No suspicious hepatic lesion identified. Normal appearance of the gallbladder. Pancreas: Unremarkable. No pancreatic ductal dilatation or surrounding inflammatory changes. Spleen: Again noted is splenic enlargement. Heterogeneous enhancement in the spleen. Focal irregularity along the anterior aspect of the spleen image 24/10 is similar to the previous examination and could be associated with a previous infarct. There is a new low-density structure along the anterior aspect of the spleen on image 27/10 that measures 2.4 cm. This was not clearly present on the recent comparison examination. This may also be associated with a recent splenic infarct. Adrenals/Urinary Tract: Normal adrenal glands. Negative kidney stones. No hydronephrosis. No suspicious renal lesions. Normal appearance of the urinary bladder. Stomach/Bowel: No evidence for bowel dilatation or obstruction. High-density material in the gastric cardia region. Mesenteric edema throughout the abdomen and surrounding bowel structures. No evidence for focal bowel inflammation. Lymphatic: Small periaortic lymph nodes. No significant lymph node enlargement in the abdomen or pelvis. Reproductive: Prostate is unremarkable. Other: Again noted is large amount of fluid extending of the right inguinal canal and right side of the scrotum. This fluid has slightly increased since 12/15/2022. Small amount of fluid in the pelvis and abdomen. Abdominal fluid is predominantly around the liver. Diffuse mesenteric edema in the  abdomen and pelvis is similar to the prior CT. Musculoskeletal: Bilateral laminectomy defects at L4  and L5. No acute bone abnormality. IMPRESSION: VASCULAR 1. Large esophageal varices associated with the left gastric vein. 2. Portal venous system is patent. Three main intrahepatic portal veins. 3. Atherosclerotic calcifications involving the abdominal aorta without aneurysm. NON-VASCULAR 1. Cirrhosis with splenomegaly, esophageal varices, right pleural effusion and small amount of ascites. Findings are compatible with portal hypertension. 2. Large right pleural effusion with compressive atelectasis. 3. Heterogeneous appearance of the enlarged spleen. New focal low density in the anterior aspect of the spleen may be cystic and measures roughly 2.4 cm. This could be associated with a recent infarct. 4. Diffuse mesenteric edema. 5. Right inguinal hernia with a large amount of fluid extending into the right scrotum. Electronically Signed: By: Richarda Overlie M.D. On: 01/26/2023 16:08   ECHOCARDIOGRAM COMPLETE  Result Date: 01/26/2023    ECHOCARDIOGRAM REPORT   Patient Name:   Connor Vaughn Date of Exam: 01/26/2023 Medical Rec #:  [id number]          Height:       66.0 in Accession #:    [id number]         Weight:       159.4 lb Date of Birth:  07-27-1960          BSA:          1.816 m Patient Age:    62 years           BP:           90/63 mmHg Patient Gender: M                  HR:           88 bpm. Exam Location:  Inpatient Procedure: 2D Echo, 3D Echo, Cardiac Doppler and Color Doppler Indications:    I50.40* Unspecified combined systolic (congestive) and diastolic                 (congestive) heart failure  History:        Patient has prior history of Echocardiogram examinations, most                 recent 04/07/2020. Hypertrophic Cardiomyopathy, Abnormal ECG and                 Defibrillator, Aortic Valve Disease, Arrythmias:NSVT; Risk                 Factors:Diabetes. ETOH.  Sonographer:    Sheralyn Boatman RDCS Referring Phys: [id number] [facility]  Sonographer Comments: Technically difficult study due to poor echo  windows. Image acquisition challenging due to respiratory motion. Apical window extremely lateral near armpit. IMPRESSIONS  1. Left ventricular ejection fraction, by estimation, is >75%. The left ventricle has hyperdynamic function. The left ventricle has no regional wall motion abnormalities. There is severe left ventricular hypertrophy of the basal-septal segment. Left ventricular diastolic parameters are consistent with Grade I diastolic dysfunction (impaired relaxation).  2. Right ventricular systolic function is normal. The right ventricular size is normal. Tricuspid regurgitation signal is inadequate for assessing PA pressure.  3. The mitral valve is myxomatous. No evidence of mitral valve regurgitation. No evidence of mitral stenosis. There is mild prolapse of posterior leaflet of the mitral valve.  4. The aortic valve is bicuspid. Aortic valve regurgitation is trivial. Moderate aortic valve stenosis.  5. 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 There is mild  dilatation of the aortic root, measuring 41 mm. FINDINGS  Left Ventricle: Left ventricular ejection fraction, by estimation, is >75%. The left ventricle has hyperdynamic function. The left ventricle has no regional wall motion abnormalities. The left ventricular internal cavity size was small. There is severe left ventricular hypertrophy of the basal-septal segment. Left ventricular diastolic parameters are consistent with Grade I diastolic dysfunction (impaired relaxation). Right Ventricle: The right ventricular size is normal. Right ventricular systolic function is normal. Tricuspid regurgitation signal is inadequate for assessing PA pressure. The tricuspid regurgitant velocity is 1.76 m/s, and with an assumed right atrial  pressure of 3 mmHg, the estimated right ventricular systolic pressure is 15.4 mmHg. Left Atrium: Left atrial size was normal in size. Right Atrium: Right atrial size was normal in size.  Pericardium: There is no evidence of pericardial effusion. Mitral Valve: The mitral valve is myxomatous. There is mild prolapse of posterior leaflet of the mitral valve. No evidence of mitral valve regurgitation. No evidence of mitral valve stenosis. Tricuspid Valve: The tricuspid valve is grossly normal. Tricuspid valve regurgitation is trivial. No evidence of tricuspid stenosis. Aortic Valve: The aortic valve is bicuspid. Aortic valve regurgitation is trivial. Moderate aortic stenosis is present. Aortic valve mean gradient measures 20.0 mmHg. Aortic valve peak gradient measures 36.1 mmHg. Aortic valve area, by VTI measures 1.43 cm. Pulmonic Valve: The pulmonic valve was normal in structure. Pulmonic valve regurgitation is not visualized. No evidence of pulmonic stenosis. 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 There is mild dilatation of the aortic root, measuring 41 mm. IAS/Shunts: No atrial level shunt detected by color flow Doppler. Additional Comments: A device lead is visualized.  LEFT VENTRICLE PLAX 2D LVIDd:         4.60 cm     Diastology LVIDs:         2.90 cm     LV e' medial:    7.72 cm/s LV PW:         1.10 cm     LV E/e' medial:  11.7 LV IVS:        1.00 cm     LV e' lateral:   7.18 cm/s LVOT diam:     2.30 cm     LV E/e' lateral: 12.6 LV SV:         84 LV SV Index:   46 LVOT Area:     4.15 cm  LV Volumes (MOD) LV vol d, MOD A2C: 65.1 ml LV vol d, MOD A4C: 69.0 ml LV vol s, MOD A2C: 26.9 ml LV vol s, MOD A4C: 25.7 ml LV SV MOD A2C:     38.2 ml LV SV MOD A4C:     69.0 ml LV SV MOD BP:      46.0 ml RIGHT VENTRICLE             IVC RV S prime:     12.60 cm/s  IVC diam: 0.90 cm TAPSE (M-mode): 2.6 cm LEFT ATRIUM             Index        RIGHT ATRIUM           Index LA diam:        2.80 cm 1.54 cm/m   RA Area:     10.20 cm LA Vol (A2C):   28.7 ml 15.80 ml/m  RA Volume:   20.70 ml  11.40 ml/m LA Vol (A4C):   24.2 ml 13.33 ml/m  LA Biplane Vol: 26.7  ml 14.70 ml/m  AORTIC VALVE AV Area (Vmax):    1.48 cm AV Area (Vmean):   1.45 cm AV Area (VTI):     1.43 cm AV Vmax:           300.40 cm/s AV Vmean:          207.200 cm/s AV VTI:            0.591 m AV Peak Grad:      36.1 mmHg AV Mean Grad:      20.0 mmHg LVOT Vmax:         107.00 cm/s LVOT Vmean:        72.400 cm/s LVOT VTI:          0.203 m LVOT/AV VTI ratio: 0.34  AORTA Ao Root diam: 4.10 cm Ao Asc diam:  3.55 cm MITRAL VALVE                TRICUSPID VALVE MV Area (PHT): 4.35 cm     TR Peak grad:   12.4 mmHg MV Decel Time: 175 msec     TR Vmax:        176.00 cm/s MV E velocity: 90.30 cm/s MV A velocity: 101.70 cm/s  SHUNTS MV E/A ratio:  0.89         Systemic VTI:  0.20 m                             Systemic Diam: 2.30 cm Olga Millers MD Electronically signed by Olga Millers MD Signature Date/Time: 01/26/2023/12:57:57 PM    Final     Medications: Scheduled:  carvedilol  18.75 mg Oral BID   dapagliflozin propanediol  10 mg Oral Daily   furosemide  40 mg Oral Daily   glipiZIDE  10 mg Oral BID WC   insulin aspart  0-5 Units Subcutaneous QHS   insulin aspart  0-9 Units Subcutaneous TID WC   lidocaine  1 patch Transdermal Q24H   linagliptin  5 mg Oral Daily   pantoprazole  40 mg Oral Daily   polyethylene glycol-electrolytes  4,000 mL Oral Once   potassium chloride SA  20 mEq Oral Daily   senna-docusate  1 tablet Oral BID   spironolactone  100 mg Oral Daily   Continuous:  Assessment/Plan: 1) Hepatic hydrothorax. 2) Anemia. 3) Heme positive stool.   The patient has a baseline level of anemia over the past four years.  Prior to this time, with his cirrhosis, he had a normal HGB in the 15-16 g/dL range.  The patient does not report any hematochezia, melena, hematemesis, or hemoptysis.  Further evaluation will be performed with an EGD/colonoscopy.  Plan: 1) EGD/colonoscopy tomorrow with Dr. Marca Ancona.  ([facility] staffing is an issue and there is a possibility that he cannot have the procedure  tomorrow. 2) Continue <2000 mg sodium diet and Step I diuretics.  Again, in the past, with this type of intervention he had a resolution of his hepatic hydrothorax.  LOS: 0 days   Tyrihanna Wingert D 01/27/2023, 3:55 PM

## 2023-01-27 NOTE — Progress Notes (Signed)
Mobility Specialist - Progress Note   01/27/23 0900  Mobility  Activity Ambulated with assistance in hallway;Ambulated with assistance to bathroom  Level of Assistance Modified independent, requires aide device or extra time  Assistive Device Front wheel walker  Distance Ambulated (ft) 480 ft  Range of Motion/Exercises Active  Activity Response Tolerated well  Mobility Referral Yes  $Mobility charge 1 Mobility  Mobility Specialist Start Time (ACUTE ONLY) 0845  Mobility Specialist Stop Time (ACUTE ONLY) 0900  Mobility Specialist Time Calculation (min) (ACUTE ONLY) 15 min   Pt was found in bed and agreeable to ambulate. No complaints with session. At EOS returned to bed with all needs met. Call bell in reach.  Billey Chang Mobility Specialist

## 2023-01-27 NOTE — Progress Notes (Signed)
PROGRESS NOTE Connor Vaughn  HQI:[id number] DOB: November 12, 1960 DOA: 01/24/2023 PCP: Ricky Stabs, NP-C  Brief Narrative/Hospital Course: 62 yom w/ alcoholic cirrhosis with ascites, recurrent right pleural effusion/hepatic hydrothorax, history of hypertrophic cardiomyopathy, history of SVT status post ICD, type 2 diabetes, CKD stage III, portal hypertension presents to the ER today with worsening shortness of breath.  He had a 4 L thoracentesis at [facility] on September 12, 2022.  He states that his lungs are filling up with fluid again.  He drinks greater than 60 ounces of water a day.  He is on Lasix and Aldactone.  He follows up with cardiology and GI. IN XL:KGMW 97.9 heart rate 108 blood pressure 147/98 satting 98% on room air. WBC-5.8, hemoglobin 9.5, platelets 163 Sodium 133, potassium 3.5, bicarb 24, BUN of 12, creatinine 0.72, glucose of 264 BNP normal at 40.Total protein 6.6, albumin 3.0, AST 35, ALT 24, alk phos 127, total bili 0.6.Chest x-ray shows large right pleural effusion. [facility] hospital[facility] consulted.Underwent thoracentesis with removal of 4 L. Patient was given IV albumin monitored overnight Pleural fluid albumin less than 1.5 LDH 32 total nucleated cells 103,Gram stain no organism. GI consulted due to patient request for TIPS eval IR completed CT angio as well as echocardiogram preparation for TI PS although radiologically amenable J wanted to see if patient completely fails on medical management of the recurrent pleural effusion if so they will arrange for outpatient TIPS procedure. Patient got ultrasound thoracentesis 9/6, 2.2 l removed.Also noted to have Hemoccult positive stool and anemia on admission 9.5 g and has been downtrending holding in mid 8 g range.   Subjective: Seen and examined Resting well. he complain of black stool   Assessment and Plan: Principal Problem:   Recurrent right pleural effusion/hepatic hydrothorax Active Problems:   Alcoholic  cirrhosis of liver with ascites (HCC)   CKD stage 3a, GFR 45-59 ml/min (HCC)   DM (diabetes mellitus), type 2 with complications (HCC)   Hypertrophic cardiomyopathy (HCC)   ICD (implantable cardioverter-defibrillator) in place   Portal hypertension (HCC)   DNR (do not resuscitate)/DNI(Do Not Intubate)   Alcoholic cirrhosis of liver with ascites Recurrent right pleural effusion/hepatic hydrothorax Underwent thoracentesis with removal of 4 L.Patient was given IV albumin.  Currently doing well on room air.  Patient reports he has had at least 4 thoracentesis this year and almost had  about 13 since his diagnosis. Pleural fluid albumin less than 1.5 LDH 32 total nucleated cells 103,Gram stain no organism.GI consulted due to patient request for TIPS eval IR completed CT angio as well as echocardiogram preparation for TI PS although radiologically amenable J wanted to see if patient completely fails on medical management of the recurrent pleural effusion if so they will arrange for outpatient TIPS procedure. S/p ultrasound thoracentesis 01/27/23: 2.2 l removed. Cont lasix, Coreg, continue on low salt and fluid restricted diet and will need very close follow-up.  Acute on chronic anemia Melanotic stool History of esophageal varices banding and colon polyp: Hemoglobin at baseline around 9.5 g downtrended-no current recent hemoglobin available, FOBT is positive planning for EGD colonoscopy tomorrow per GI.  Continue PPI.  Recent Labs    01/25/23 0512 01/25/23 0746 01/25/23 1906 01/26/23 0452 01/27/23 0505  HGB 7.4* 7.4* 7.8* 8.6* 8.0*  MCV 79.8*  --   --  80.5 79.1*  VITAMINB12  --   --  590  --   --   FOLATE  --   --  21.7  --   --  FERRITIN  --   --  6*  --   --   TIBC  --   --  367  --   --   IRON  --   --  13*  --   --   RETICCTPCT 3.0  --   --   --   --     Portal hypertension Continue coreg  bid.  Hypertrophic cardiomyopathy   ICD in place: Stable.Cardiac MRI from 03-2020 document  HCOM.  Echo shows EF more than 75%, G1 DD severe LVH basal septal segment aortic valve bicuspid mitral valve myxomatous   T2DM:  Continue sliding scale insulin, glipizide and Januvia/farxiga, he has elevated Ahba1c at 10 Recent Labs  Lab 01/25/23 0512 01/25/23 0729 01/26/23 1144 01/26/23 1635 01/26/23 2053 01/27/23 0743 01/27/23 1245  GLUCAP  --    < > 177* 305* 135* 156* 172*  HGBA1C 10.0*  --   --   --   --   --   --    < > = values in this interval not displayed.    CKD likely stage 2: his creatinine is at 0.8  DVT prophylaxis: SCDs Start: 01/25/23 0156 Code Status:   Code Status: Limited: Do not attempt resuscitation (DNR) -DNR-LIMITED -Do Not Intubate/DNI  Family Communication: plan of care discussed with patient/duaghter at bedside. Patient status is: admitted as observation but remains hospitalized for ongoing recurrent pleural effusion and anemia  Level of care: Telemetry  Dispo: The patient is from: home            Anticipated disposition: TBD Objective: Vitals last 24 hrs: Vitals:   01/27/23 0949 01/27/23 1117 01/27/23 1126 01/27/23 1249  BP: 96/69 121/83 112/78 107/71  Pulse: 90   88  Resp:    18  Temp:    98 F (36.7 C)  TempSrc:    Oral  SpO2:    97%  Weight:      Height:       Weight change: 5.7 kg  Physical Examination: General exam: alert awake, oriented at baseline, older than stated age HEENT:Oral mucosa moist, Ear/Nose WNL grossly Respiratory system: Bilaterally clear BS,no use of accessory muscle Cardiovascular system: S1 & S2 +, No JVD. Gastrointestinal system: Abdomen soft,NT,ND, BS+ Nervous System: Alert, awake, moving all extremities,and following commands. Extremities: LE edema neg,distal peripheral pulses palpable and warm.  Skin: No rashes,no icterus. MSK: Normal muscle bulk,tone, power   Medications reviewed:  Scheduled Meds:  carvedilol  18.75 mg Oral BID   dapagliflozin propanediol  10 mg Oral Daily   furosemide  40 mg Oral  Daily   glipiZIDE  10 mg Oral BID WC   insulin aspart  0-5 Units Subcutaneous QHS   insulin aspart  0-9 Units Subcutaneous TID WC   lidocaine  1 patch Transdermal Q24H   linagliptin  5 mg Oral Daily   pantoprazole  40 mg Oral Daily   potassium chloride SA  20 mEq Oral Daily   senna-docusate  1 tablet Oral BID   spironolactone  100 mg Oral Daily   Continuous Infusions:    Diet Order             Diet 2 gram sodium Room service appropriate? Yes; Fluid consistency: Thin  Diet effective now                  Intake/Output Summary (Last 24 hours) at 01/27/2023 1510 Last data filed at 01/27/2023 0900 Gross per 24 hour  Intake 240 ml  Output 1475 ml  Net -1235 ml   Net IO Since Admission: -3,240 mL [01/27/23 1510]  Wt Readings from Last 3 Encounters:  01/27/23 78 kg  11/02/22 76.1 kg  03/22/22 84.9 kg     Unresulted Labs (From admission, onward)     Start     Ordered   01/26/23 0500  CBC  Daily,   R      01/25/23 0921   01/26/23 0500  Basic metabolic panel  Daily,   R      01/25/23 0921          Data Reviewed: I have personally reviewed following labs and imaging studies CBC: Recent Labs  Lab 01/24/23 1917 01/25/23 0512 01/25/23 0746 01/25/23 1906 01/26/23 0452 01/27/23 0505  WBC 5.8 3.5*  --   --  2.4* 2.8*  NEUTROABS 3.6 2.2  --   --   --   --   HGB 9.5* 7.4* 7.4* 7.8* 8.6* 8.0*  HCT 31.9* 24.9* 25.0* 25.9* 28.9* 26.1*  MCV 79.8* 79.8*  --   --  80.5 79.1*  PLT 163 109*  --   --  102* 111*   Basic Metabolic Panel: Recent Labs  Lab 01/24/23 1917 01/25/23 0512 01/26/23 0452 01/27/23 0505  NA 133* 131* 134* 135  K 3.5 3.5 3.3* 3.2*  CL 99 96* 96* 98  CO2 24 27 29 27   GLUCOSE 264* 330* 254* 179*  BUN 12 13 13 16   CREATININE 0.72 0.80 0.87 0.99  CALCIUM 8.7* 8.1* 8.3* 8.3*  MG  --  1.7  --   --    GFR: Estimated Creatinine Clearance: 78 mL/min (by C-G formula based on SCr of 0.99 mg/dL). Liver Function Tests: Recent Labs  Lab 01/24/23 1917  01/25/23 0512  AST 35 25  ALT 24 18  ALKPHOS 127* 94  BILITOT 0.6 0.9  PROT 6.6 5.8*  ALBUMIN 3.0* 3.0*  HbA1C: Recent Labs    01/25/23 0512  HGBA1C 10.0*   CBG: Recent Labs  Lab 01/26/23 1144 01/26/23 1635 01/26/23 2053 01/27/23 0743 01/27/23 1245  GLUCAP 177* 305* 135* 156* 172*   Recent Results (from the past 240 hour(s))  Body fluid culture w Gram Stain     Status: None (Preliminary result)   Collection Time: 01/24/23 10:10 PM   Specimen: Pleural Fluid  Result Value Ref Range Status   Specimen Description   Final    PLEURAL Performed at [facility], [address], [place], [place] [address]    Special Requests   Final    NONE Performed at [facility], [address] [place], [place] [address]    Gram Stain   Final    RARE WBC PRESENT, PREDOMINANTLY MONONUCLEAR NO ORGANISMS SEEN    Culture   Final    NO GROWTH 2 DAYS Performed at  [facility], [address], [place], [place] [address]    Report Status PENDING  Incomplete    Antimicrobials: Anti-infectives (From admission, onward)    None      Culture/Microbiology    Component Value Date/Time   SDES  01/24/2023 2210    PLEURAL Performed at [facility], [address] [place], [place] [address]    SPECREQUEST  01/24/2023 2210    NONE Performed at [facility], [address], [place], [place] [address]    CULT  01/24/2023 2210    NO GROWTH 2 DAYS Performed at [facility],  [address] [place], [place] [address]    REPTSTATUS PENDING 01/24/2023 2210  Radiology Studies: DG CHEST PORT 1 VIEW  Result Date: 01/27/2023 CLINICAL DATA:  Status post right-sided thoracentesis. EXAM: PORTABLE CHEST 1 VIEW COMPARISON:  One-view chest x-ray 01/24/2023 FINDINGS: No right-sided pneumothorax is present. A small amount of residual fluid or basilar atelectasis is present. The lung volumes are low. AICD wire is  stable. Heart size is normal. IMPRESSION: 1. No right-sided pneumothorax following thoracentesis. 2. Small amount of residual fluid or basilar atelectasis. Electronically Signed   By: Marin Roberts M.D.   On: 01/27/2023 12:58   CT Angio Abd/Pel w/ and/or w/o  Addendum Date: 01/26/2023   ADDENDUM REPORT: 01/26/2023 17:39 ADDENDUM: Main portal vein measures approximately 1.3 cm. There is a short right portal vein. The 3 main intrahepatic portal veins are the left portal vein, anterior branch of the right portal vein and posterior branch of the right portal vein. Electronically Signed   By: Richarda Overlie M.D.   On: 01/26/2023 17:39   Result Date: 01/26/2023 CLINICAL DATA:  Recurrent right pleural effusion. Hepatic hydrothorax. Esophageal varices. EXAM: CTA ABDOMEN AND PELVIS WITHOUT AND WITH CONTRAST TECHNIQUE: Multidetector CT imaging of the abdomen and pelvis was performed using the standard protocol during bolus administration of intravenous contrast. Multiplanar reconstructed images and MIPs were obtained and reviewed to evaluate the vascular anatomy. RADIATION DOSE REDUCTION: This exam was performed according to the departmental dose-optimization program which includes automated exposure control, adjustment of the mA and/or kV according to patient size and/or use of iterative reconstruction technique. CONTRAST:  OMNIPAQUE IOHEXOL 350 MG/ML SOLN COMPARISON:  Chest CT abdomen and pelvis 12/15/2022 FINDINGS: VASCULAR Aorta: Atherosclerotic disease abdominal aorta without aneurysm, dissection or significant stenosis. Celiac: Patent without evidence of aneurysm, dissection, vasculitis or significant stenosis. SMA: Patent without evidence of aneurysm, dissection, vasculitis or significant stenosis. Renals: Both renal arteries are patent without evidence of aneurysm, dissection, vasculitis, fibromuscular dysplasia or significant stenosis. IMA: Patent Inflow: Atherosclerotic calcifications involving  bilateral iliac arteries without aneurysm dissection or significant stenosis. Proximal Outflow: Proximal femoral arteries are patent bilaterally. Veins: Again noted are enlarged distal esophageal varices. Largest esophageal varices measures 1.5 cm in transverse dimension. The esophageal varices appear to be associated with the left gastric vein. Intrahepatic portal veins are patent. There are 3 main intrahepatic portal veins. Superior mesenteric vein and splenic vein are patent. Bilateral renal veins are patent. No significant gastro renal shunt. No significant gastric varices identified. Review of the MIP images confirms the above findings. NON-VASCULAR Lower chest: Again noted is a large right pleural effusion with compressive atelectasis in the right lower lobe. Left lung base is clear. ICD lead extends into the right ventricle. Calcifications at the aortic valve. Hepatobiliary: Liver is diffusely nodular and compatible with cirrhosis. No suspicious hepatic lesion identified. Normal appearance of the gallbladder. Pancreas: Unremarkable. No pancreatic ductal dilatation or surrounding inflammatory changes. Spleen: Again noted is splenic enlargement. Heterogeneous enhancement in the spleen. Focal irregularity along the anterior aspect of the spleen image 24/10 is similar to the previous examination and could be associated with a previous infarct. There is a new low-density structure along the anterior aspect of the spleen on image 27/10 that measures 2.4 cm. This was not clearly present on the recent comparison examination. This may also be associated with a recent splenic infarct. Adrenals/Urinary Tract: Normal adrenal glands. Negative kidney stones. No hydronephrosis. No suspicious renal lesions. Normal appearance of the urinary bladder. Stomach/Bowel: No  evidence for bowel dilatation or obstruction. High-density material in the gastric cardia region. Mesenteric edema throughout the abdomen and surrounding bowel  structures. No evidence for focal bowel inflammation. Lymphatic: Small periaortic lymph nodes. No significant lymph node enlargement in the abdomen or pelvis. Reproductive: Prostate is unremarkable. Other: Again noted is large amount of fluid extending of the right inguinal canal and right side of the scrotum. This fluid has slightly increased since 12/15/2022. Small amount of fluid in the pelvis and abdomen. Abdominal fluid is predominantly around the liver. Diffuse mesenteric edema in the abdomen and pelvis is similar to the prior CT. Musculoskeletal: Bilateral laminectomy defects at L4 and L5. No acute bone abnormality. IMPRESSION: VASCULAR 1. Large esophageal varices associated with the left gastric vein. 2. Portal venous system is patent. Three main intrahepatic portal veins. 3. Atherosclerotic calcifications involving the abdominal aorta without aneurysm. NON-VASCULAR 1. Cirrhosis with splenomegaly, esophageal varices, right pleural effusion and small amount of ascites. Findings are compatible with portal hypertension. 2. Large right pleural effusion with compressive atelectasis. 3. Heterogeneous appearance of the enlarged spleen. New focal low density in the anterior aspect of the spleen may be cystic and measures roughly 2.4 cm. This could be associated with a recent infarct. 4. Diffuse mesenteric edema. 5. Right inguinal hernia with a large amount of fluid extending into the right scrotum. Electronically Signed: By: Richarda Overlie M.D. On: 01/26/2023 16:08   ECHOCARDIOGRAM COMPLETE  Result Date: 01/26/2023    ECHOCARDIOGRAM REPORT   Patient Name:   JAMAAR BOTTORFF Date of Exam: 01/26/2023 Medical Rec #:  [id number]          Height:       66.0 in Accession #:    [id number]         Weight:       159.4 lb Date of Birth:  01-08-1961          BSA:          1.816 m Patient Age:    62 years           BP:           90/63 mmHg Patient Gender: M                  HR:           88 bpm. Exam Location:  Inpatient  Procedure: 2D Echo, 3D Echo, Cardiac Doppler and Color Doppler Indications:    I50.40* Unspecified combined systolic (congestive) and diastolic                 (congestive) heart failure  History:        Patient has prior history of Echocardiogram examinations, most                 recent 04/07/2020. Hypertrophic Cardiomyopathy, Abnormal ECG and                 Defibrillator, Aortic Valve Disease, Arrythmias:NSVT; Risk                 Factors:Diabetes. ETOH.  Sonographer:    Sheralyn Boatman RDCS Referring Phys: [id number] [facility]  Sonographer Comments: Technically difficult study due to poor echo windows. Image acquisition challenging due to respiratory motion. Apical window extremely lateral near armpit. IMPRESSIONS  1. Left ventricular ejection fraction, by estimation, is >75%. The left ventricle has hyperdynamic function. The left ventricle has no regional wall motion abnormalities. There is severe left ventricular hypertrophy of the basal-septal segment.  Left ventricular diastolic parameters are consistent with Grade I diastolic dysfunction (impaired relaxation).  2. Right ventricular systolic function is normal. The right ventricular size is normal. Tricuspid regurgitation signal is inadequate for assessing PA pressure.  3. The mitral valve is myxomatous. No evidence of mitral valve regurgitation. No evidence of mitral stenosis. There is mild prolapse of posterior leaflet of the mitral valve.  4. The aortic valve is bicuspid. Aortic valve regurgitation is trivial. Moderate aortic valve stenosis.  5. 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 There is mild dilatation of the aortic root, measuring 41 mm. FINDINGS  Left Ventricle: Left ventricular ejection fraction, by estimation, is >75%. The left ventricle has hyperdynamic function. The left ventricle has no regional wall motion abnormalities. The left ventricular internal cavity size was small. There is severe left ventricular  hypertrophy of the basal-septal segment. Left ventricular diastolic parameters are consistent with Grade I diastolic dysfunction (impaired relaxation). Right Ventricle: The right ventricular size is normal. Right ventricular systolic function is normal. Tricuspid regurgitation signal is inadequate for assessing PA pressure. The tricuspid regurgitant velocity is 1.76 m/s, and with an assumed right atrial  pressure of 3 mmHg, the estimated right ventricular systolic pressure is 15.4 mmHg. Left Atrium: Left atrial size was normal in size. Right Atrium: Right atrial size was normal in size. Pericardium: There is no evidence of pericardial effusion. Mitral Valve: The mitral valve is myxomatous. There is mild prolapse of posterior leaflet of the mitral valve. No evidence of mitral valve regurgitation. No evidence of mitral valve stenosis. Tricuspid Valve: The tricuspid valve is grossly normal. Tricuspid valve regurgitation is trivial. No evidence of tricuspid stenosis. Aortic Valve: The aortic valve is bicuspid. Aortic valve regurgitation is trivial. Moderate aortic stenosis is present. Aortic valve mean gradient measures 20.0 mmHg. Aortic valve peak gradient measures 36.1 mmHg. Aortic valve area, by VTI measures 1.43 cm. Pulmonic Valve: The pulmonic valve was normal in structure. Pulmonic valve regurgitation is not visualized. No evidence of pulmonic stenosis. 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 There is mild dilatation of the aortic root, measuring 41 mm. IAS/Shunts: No atrial level shunt detected by color flow Doppler. Additional Comments: A device lead is visualized.  LEFT VENTRICLE PLAX 2D LVIDd:         4.60 cm     Diastology LVIDs:         2.90 cm     LV e' medial:    7.72 cm/s LV PW:         1.10 cm     LV E/e' medial:  11.7 LV IVS:        1.00 cm     LV e' lateral:   7.18 cm/s LVOT diam:     2.30 cm     LV E/e' lateral: 12.6 LV SV:         84 LV SV  Index:   46 LVOT Area:     4.15 cm  LV Volumes (MOD) LV vol d, MOD A2C: 65.1 ml LV vol d, MOD A4C: 69.0 ml LV vol s, MOD A2C: 26.9 ml LV vol s, MOD A4C: 25.7 ml LV SV MOD A2C:     38.2 ml LV SV MOD A4C:     69.0 ml LV SV MOD BP:      46.0 ml RIGHT VENTRICLE             IVC RV S prime:     12.60  cm/s  IVC diam: 0.90 cm TAPSE (M-mode): 2.6 cm LEFT ATRIUM             Index        RIGHT ATRIUM           Index LA diam:        2.80 cm 1.54 cm/m   RA Area:     10.20 cm LA Vol (A2C):   28.7 ml 15.80 ml/m  RA Volume:   20.70 ml  11.40 ml/m LA Vol (A4C):   24.2 ml 13.33 ml/m LA Biplane Vol: 26.7 ml 14.70 ml/m  AORTIC VALVE AV Area (Vmax):    1.48 cm AV Area (Vmean):   1.45 cm AV Area (VTI):     1.43 cm AV Vmax:           300.40 cm/s AV Vmean:          207.200 cm/s AV VTI:            0.591 m AV Peak Grad:      36.1 mmHg AV Mean Grad:      20.0 mmHg LVOT Vmax:         107.00 cm/s LVOT Vmean:        72.400 cm/s LVOT VTI:          0.203 m LVOT/AV VTI ratio: 0.34  AORTA Ao Root diam: 4.10 cm Ao Asc diam:  3.55 cm MITRAL VALVE                TRICUSPID VALVE MV Area (PHT): 4.35 cm     TR Peak grad:   12.4 mmHg MV Decel Time: 175 msec     TR Vmax:        176.00 cm/s MV E velocity: 90.30 cm/s MV A velocity: 101.70 cm/s  SHUNTS MV E/A ratio:  0.89         Systemic VTI:  0.20 m                             Systemic Diam: 2.30 cm Olga Millers MD Electronically signed by Olga Millers MD Signature Date/Time: 01/26/2023/12:57:57 PM    Final      LOS: 0 days   Lanae Boast, MD [facility]  01/27/2023, 3:10 PM

## 2023-01-27 NOTE — Plan of Care (Signed)
  Problem: Fluid Volume: Goal: Ability to maintain a balanced intake and output will improve Outcome: Progressing   Problem: Coping: Goal: Ability to adjust to condition or change in health will improve Outcome: Progressing   Problem: Education: Goal: Ability to describe self-care measures that may prevent or decrease complications (Diabetes Survival Skills Education) will improve Outcome: Progressing Goal: Individualized Educational Video(s) Outcome: Progressing

## 2023-01-27 NOTE — Procedures (Signed)
Ultrasound-guided  therapeutic right sided thoracentesis performed yielding 2.2 liters of straw colored fluid. No immediate complications. Follow-up chest x-ray pending. EBL is < 2 ml.

## 2023-01-28 DIAGNOSIS — J90 Pleural effusion, not elsewhere classified: Secondary | ICD-10-CM | POA: Diagnosis not present

## 2023-01-28 LAB — BODY FLUID CULTURE W GRAM STAIN: Culture: NO GROWTH

## 2023-01-28 LAB — BASIC METABOLIC PANEL
Anion gap: 6 (ref 5–15)
BUN: 12 mg/dL (ref 6–20)
CO2: 26 mmol/L (ref 22–32)
Calcium: 8.2 mg/dL — ABNORMAL LOW (ref 8.9–10.3)
Chloride: 102 mmol/L (ref 98–111)
Creatinine, Ser: 0.64 mg/dL (ref 0.61–1.24)
GFR, Estimated: >60 mL/min (ref >60)
Glucose, Bld: 98 mg/dL (ref 70–99)
Potassium: 3.9 mmol/L (ref 3.5–5.1)
Sodium: 134 mmol/L — ABNORMAL LOW (ref 135–145)

## 2023-01-28 LAB — CBC
HCT: 27 % — ABNORMAL LOW (ref 39.0–52.0)
Hemoglobin: 7.9 g/dL — ABNORMAL LOW (ref 13.0–17.0)
MCH: 23.3 pg — ABNORMAL LOW (ref 26.0–34.0)
MCHC: 29.3 g/dL — ABNORMAL LOW (ref 30.0–36.0)
MCV: 79.6 fL — ABNORMAL LOW (ref 80.0–100.0)
Platelets: 97 10*3/uL — ABNORMAL LOW (ref 150–400)
RBC: 3.39 MIL/uL — ABNORMAL LOW (ref 4.22–5.81)
RDW: 14.7 % (ref 11.5–15.5)
WBC: 2.5 10*3/uL — ABNORMAL LOW (ref 4.0–10.5)
nRBC: 0 % (ref 0.0–0.2)

## 2023-01-28 LAB — GLUCOSE, CAPILLARY
Glucose-Capillary: 164 mg/dL — ABNORMAL HIGH (ref 70–99)
Glucose-Capillary: 263 mg/dL — ABNORMAL HIGH (ref 70–99)
Glucose-Capillary: 185 mg/dL — ABNORMAL HIGH (ref 70–99)
Glucose-Capillary: 166 mg/dL — ABNORMAL HIGH (ref 70–99)

## 2023-01-28 MED ORDER — FLUTICASONE PROPIONATE 50 MCG/ACT NA SUSP: 1.0000 | Freq: Every day | NASAL | Status: DC | PRN

## 2023-01-28 MED ORDER — PREGABALIN 75 MG PO CAPS
75.0000 mg | ORAL_CAPSULE | Freq: Once | ORAL | Status: AC
  Administered 2023-01-28: 75 mg via ORAL
  Filled 2023-01-28: qty 1

## 2023-01-28 MED ORDER — POLYVINYL ALCOHOL 1.4 % OP SOLN: 1.0000 [drp] | Freq: Two times a day (BID) | OPHTHALMIC | Status: DC | PRN

## 2023-01-28 NOTE — Progress Notes (Signed)
Mobility Specialist - Progress Note   01/28/23 1105  Mobility  Activity Ambulated with assistance in hallway  Level of Assistance Modified independent, requires aide device or extra time  Assistive Device Front wheel walker  Distance Ambulated (ft) 480 ft  Range of Motion/Exercises Active  Activity Response Tolerated well  Mobility Referral Yes  $Mobility charge 1 Mobility  Mobility Specialist Start Time (ACUTE ONLY) 1045  Mobility Specialist Stop Time (ACUTE ONLY) 1105  Mobility Specialist Time Calculation (min) (ACUTE ONLY) 20 min   Pt was found in bed and agreeable to ambulate. At EOS stated feeling a little SOB and had a coughing spell. Returned to bed with all needs met. Call bell in reach.  Billey Chang Mobility Specialist

## 2023-01-28 NOTE — Plan of Care (Signed)

## 2023-01-28 NOTE — H&P (View-Only) (Signed)
Cross Coverage for Dr.Hung and Dr.Mann  Subjective: Patient thinks he may have had a colonoscopy in the last 6 months to 1 year. He is aware that he had an EGD requiring banding of esophageal varices.  Objective: Vital signs in last 24 hours: Temp:  [97.5 F (36.4 C)-98 F (36.7 C)] 97.5 F (36.4 C) (09/07 0513) Pulse Rate:  [84-88] 84 (09/07 0513) Resp:  [16-20] 20 (09/07 0513) BP: (105-107)/(69-72) 105/72 (09/07 0513) SpO2:  [97 %] 97 % (09/07 0513) Weight:  [77.2 kg] 77.2 kg (09/07 0513) Weight change: -0.8 kg Last BM Date : 01/27/23  PE: Alert, awake, oriented x 3 GENERAL: Decreased breath sounds on right side  ABDOMEN: Nondistended abdomen EXTREMITIES: No edema  Lab Results: Results for orders placed or performed during the hospital encounter of 01/24/23 (from the past 48 hour(s))  Glucose, capillary     Status: Abnormal   Collection Time: 01/26/23 11:44 AM  Result Value Ref Range   Glucose-Capillary 177 (H) 70 - 99 mg/dL    Comment: Glucose reference range applies only to samples taken after fasting for at least 8 hours.  Occult blood card to lab, stool     Status: Abnormal   Collection Time: 01/26/23  3:07 PM  Result Value Ref Range   Fecal Occult Bld POSITIVE (A) NEGATIVE    Comment: Performed at [facility], [address] [place], [place] [address]  Glucose, capillary     Status: Abnormal   Collection Time: 01/26/23  4:35 PM  Result Value Ref Range   Glucose-Capillary 305 (H) 70 - 99 mg/dL    Comment: Glucose reference range applies only to samples taken after fasting for at least 8 hours.  Glucose, capillary     Status: Abnormal   Collection Time: 01/26/23  8:53 PM  Result Value Ref Range   Glucose-Capillary 135 (H) 70 - 99 mg/dL    Comment: Glucose reference range applies only to samples taken after fasting for at least 8 hours.  CBC     Status: Abnormal   Collection Time: 01/27/23  5:05 AM  Result Value Ref Range   WBC 2.8 (L) 4.0 -  10.5 K/uL   RBC 3.30 (L) 4.22 - 5.81 MIL/uL   Hemoglobin 8.0 (L) 13.0 - 17.0 g/dL   HCT 60.4 (L) 54.0 - 98.1 %   MCV 79.1 (L) 80.0 - 100.0 fL   MCH 24.2 (L) 26.0 - 34.0 pg   MCHC 30.7 30.0 - 36.0 g/dL   RDW 19.1 47.8 - 29.5 %   Platelets 111 (L) 150 - 400 K/uL    Comment: Immature Platelet Fraction may be clinically indicated, consider ordering this additional test [id number] REPEATED TO VERIFY    nRBC 0.0 0.0 - 0.2 %    Comment: Performed at [facility], [address], [place], [place] [address]  Basic metabolic panel     Status: Abnormal   Collection Time: 01/27/23  5:05 AM  Result Value Ref Range   Sodium 135 135 - 145 mmol/L   Potassium 3.2 (L) 3.5 - 5.1 mmol/L   Chloride 98 98 - 111 mmol/L   CO2 27 22 - 32 mmol/L   Glucose, Bld 179 (H) 70 - 99 mg/dL    Comment: Glucose reference range applies only to samples taken after fasting for at least 8 hours.   BUN 16 6 - 20 mg/dL   Creatinine, Ser 6.29 0.61 - 1.24 mg/dL   Calcium 8.3 (L) 8.9 -  10.3 mg/dL   GFR, Estimated >65 >78 mL/min    Comment: (NOTE) Calculated using the CKD-EPI Creatinine Equation (2021)    Anion gap 10 5 - 15    Comment: Performed at [facility], [address], [place], [place] [address]  Glucose, capillary     Status: Abnormal   Collection Time: 01/27/23  7:43 AM  Result Value Ref Range   Glucose-Capillary 156 (H) 70 - 99 mg/dL    Comment: Glucose reference range applies only to samples taken after fasting for at least 8 hours.  Glucose, capillary     Status: Abnormal   Collection Time: 01/27/23 12:45 PM  Result Value Ref Range   Glucose-Capillary 172 (H) 70 - 99 mg/dL    Comment: Glucose reference range applies only to samples taken after fasting for at least 8 hours.  Glucose, capillary     Status: Abnormal   Collection Time: 01/27/23  4:09 PM  Result Value Ref Range   Glucose-Capillary 195 (H) 70 - 99 mg/dL    Comment: Glucose reference range applies only  to samples taken after fasting for at least 8 hours.  Glucose, capillary     Status: Abnormal   Collection Time: 01/27/23  9:04 PM  Result Value Ref Range   Glucose-Capillary 142 (H) 70 - 99 mg/dL    Comment: Glucose reference range applies only to samples taken after fasting for at least 8 hours.  CBC     Status: Abnormal   Collection Time: 01/28/23  5:01 AM  Result Value Ref Range   WBC 2.5 (L) 4.0 - 10.5 K/uL   RBC 3.39 (L) 4.22 - 5.81 MIL/uL   Hemoglobin 7.9 (L) 13.0 - 17.0 g/dL   HCT 95.2 (L) 84.1 - 32.4 %   MCV 79.6 (L) 80.0 - 100.0 fL   MCH 23.3 (L) 26.0 - 34.0 pg   MCHC 29.3 (L) 30.0 - 36.0 g/dL   RDW 40.1 02.7 - 25.3 %   Platelets 97 (L) 150 - 400 K/uL    Comment: SPECIMEN CHECKED FOR CLOTS Immature Platelet Fraction may be clinically indicated, consider ordering this additional test [id number] CONSISTENT WITH PREVIOUS RESULT REPEATED TO VERIFY    nRBC 0.0 0.0 - 0.2 %    Comment: Performed at [facility], [address] [place], [place] [address]  Basic metabolic panel     Status: Abnormal   Collection Time: 01/28/23  5:01 AM  Result Value Ref Range   Sodium 134 (L) 135 - 145 mmol/L   Potassium 3.9 3.5 - 5.1 mmol/L   Chloride 102 98 - 111 mmol/L   CO2 26 22 - 32 mmol/L   Glucose, Bld 98 70 - 99 mg/dL    Comment: Glucose reference range applies only to samples taken after fasting for at least 8 hours.   BUN 12 6 - 20 mg/dL   Creatinine, Ser 6.38 0.61 - 1.24 mg/dL   Calcium 8.2 (L) 8.9 - 10.3 mg/dL   GFR, Estimated >75 >64 mL/min    Comment: (NOTE) Calculated using the CKD-EPI Creatinine Equation (2021)    Anion gap 6 5 - 15    Comment: Performed at [facility], [address], [place], [place] [address]  Glucose, capillary     Status: Abnormal   Collection Time: 01/28/23  7:53 AM  Result Value Ref Range   Glucose-Capillary 164 (H) 70 - 99 mg/dL    Comment: Glucose reference range applies only to samples taken  after  fasting for at least 8 hours.    Studies/Results: US THORACENTESIS ASP PLEURAL SPACE W/IMG GUIDE  Result Date: 01/27/2023 INDICATION: 62 year old male inpatient. History of hepatic hydrothorax. Is requesting a therapeutic right-sided thoracentesis EXAM: ULTRASOUND GUIDED THERAPEUTIC RIGHT-SIDED THORACENTESIS MEDICATIONS: Lidocaine 1% 10 mL COMPLICATIONS: None immediate. PROCEDURE: An ultrasound guided thoracentesis was thoroughly discussed with the patient and questions answered. The benefits, risks, alternatives and complications were also discussed. The patient understands and wishes to proceed with the procedure. Written consent was obtained. Ultrasound was performed to localize and mark an adequate pocket of fluid in the right chest. The area was then prepped and draped in the normal sterile fashion. 1% Lidocaine was used for local anesthesia. Under ultrasound guidance a 19 gauge, 7-cm, Yueh catheter was introduced. Thoracentesis was performed. The catheter was removed and a dressing applied. FINDINGS: A total of approximately 2.2 L of straw-colored fluid was removed. IMPRESSION: Successful ultrasound guided right-sided therapeutic thoracentesis yielding 2.2 L of pleural fluid. Read by: Anders Grant, NP Electronically Signed   By: Gilmer Mor D.O.   On: 01/27/2023 15:35   DG CHEST PORT 1 VIEW  Result Date: 01/27/2023 CLINICAL DATA:  Status post right-sided thoracentesis. EXAM: PORTABLE CHEST 1 VIEW COMPARISON:  One-view chest x-ray 01/24/2023 FINDINGS: No right-sided pneumothorax is present. A small amount of residual fluid or basilar atelectasis is present. The lung volumes are low. AICD wire is stable. Heart size is normal. IMPRESSION: 1. No right-sided pneumothorax following thoracentesis. 2. Small amount of residual fluid or basilar atelectasis. Electronically Signed   By: Marin Roberts M.D.   On: 01/27/2023 12:58   ECHOCARDIOGRAM COMPLETE  Result Date: 01/26/2023    ECHOCARDIOGRAM  REPORT   Patient Name:   Connor Vaughn Date of Exam: 01/26/2023 Medical Rec #:  [id number]          Height:       66.0 in Accession #:    [id number]         Weight:       159.4 lb Date of Birth:  02/23/1961          BSA:          1.816 m Patient Age:    62 years           BP:           90/63 mmHg Patient Gender: M                  HR:           88 bpm. Exam Location:  Inpatient Procedure: 2D Echo, 3D Echo, Cardiac Doppler and Color Doppler Indications:    I50.40* Unspecified combined systolic (congestive) and diastolic                 (congestive) heart failure  History:        Patient has prior history of Echocardiogram examinations, most                 recent 04/07/2020. Hypertrophic Cardiomyopathy, Abnormal ECG and                 Defibrillator, Aortic Valve Disease, Arrythmias:NSVT; Risk                 Factors:Diabetes. ETOH.  Sonographer:    Sheralyn Boatman RDCS Referring Phys: [id number] [facility]  Sonographer Comments: Technically difficult study due to poor echo windows. Image acquisition challenging due to respiratory motion. Apical window extremely lateral near  armpit. IMPRESSIONS  1. Left ventricular ejection fraction, by estimation, is >75%. The left ventricle has hyperdynamic function. The left ventricle has no regional wall motion abnormalities. There is severe left ventricular hypertrophy of the basal-septal segment. Left ventricular diastolic parameters are consistent with Grade I diastolic dysfunction (impaired relaxation).  2. Right ventricular systolic function is normal. The right ventricular size is normal. Tricuspid regurgitation signal is inadequate for assessing PA pressure.  3. The mitral valve is myxomatous. No evidence of mitral valve regurgitation. No evidence of mitral stenosis. There is mild prolapse of posterior leaflet of the mitral valve.  4. The aortic valve is bicuspid. Aortic valve regurgitation is trivial. Moderate aortic valve stenosis.  5. 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 There is mild dilatation of the aortic root, measuring 41 mm. FINDINGS  Left Ventricle: Left ventricular ejection fraction, by estimation, is >75%. The left ventricle has hyperdynamic function. The left ventricle has no regional wall motion abnormalities. The left ventricular internal cavity size was small. There is severe left ventricular hypertrophy of the basal-septal segment. Left ventricular diastolic parameters are consistent with Grade I diastolic dysfunction (impaired relaxation). Right Ventricle: The right ventricular size is normal. Right ventricular systolic function is normal. Tricuspid regurgitation signal is inadequate for assessing PA pressure. The tricuspid regurgitant velocity is 1.76 m/s, and with an assumed right atrial  pressure of 3 mmHg, the estimated right ventricular systolic pressure is 15.4 mmHg. Left Atrium: Left atrial size was normal in size. Right Atrium: Right atrial size was normal in size. Pericardium: There is no evidence of pericardial effusion. Mitral Valve: The mitral valve is myxomatous. There is mild prolapse of posterior leaflet of the mitral valve. No evidence of mitral valve regurgitation. No evidence of mitral valve stenosis. Tricuspid Valve: The tricuspid valve is grossly normal. Tricuspid valve regurgitation is trivial. No evidence of tricuspid stenosis. Aortic Valve: The aortic valve is bicuspid. Aortic valve regurgitation is trivial. Moderate aortic stenosis is present. Aortic valve mean gradient measures 20.0 mmHg. Aortic valve peak gradient measures 36.1 mmHg. Aortic valve area, by VTI measures 1.43 cm. Pulmonic Valve: The pulmonic valve was normal in structure. Pulmonic valve regurgitation is not visualized. No evidence of pulmonic stenosis. 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 There is mild dilatation of the aortic root, measuring 41 mm. IAS/Shunts: No atrial level  shunt detected by color flow Doppler. Additional Comments: A device lead is visualized.  LEFT VENTRICLE PLAX 2D LVIDd:         4.60 cm     Diastology LVIDs:         2.90 cm     LV e' medial:    7.72 cm/s LV PW:         1.10 cm     LV E/e' medial:  11.7 LV IVS:        1.00 cm     LV e' lateral:   7.18 cm/s LVOT diam:     2.30 cm     LV E/e' lateral: 12.6 LV SV:         84 LV SV Index:   46 LVOT Area:     4.15 cm  LV Volumes (MOD) LV vol d, MOD A2C: 65.1 ml LV vol d, MOD A4C: 69.0 ml LV vol s, MOD A2C: 26.9 ml LV vol s, MOD A4C: 25.7 ml LV SV MOD A2C:     38.2 ml LV SV MOD A4C:  69.0 ml LV SV MOD BP:      46.0 ml RIGHT VENTRICLE             IVC RV S prime:     12.60 cm/s  IVC diam: 0.90 cm TAPSE (M-mode): 2.6 cm LEFT ATRIUM             Index        RIGHT ATRIUM           Index LA diam:        2.80 cm 1.54 cm/m   RA Area:     10.20 cm LA Vol (A2C):   28.7 ml 15.80 ml/m  RA Volume:   20.70 ml  11.40 ml/m LA Vol (A4C):   24.2 ml 13.33 ml/m LA Biplane Vol: 26.7 ml 14.70 ml/m  AORTIC VALVE AV Area (Vmax):    1.48 cm AV Area (Vmean):   1.45 cm AV Area (VTI):     1.43 cm AV Vmax:           300.40 cm/s AV Vmean:          207.200 cm/s AV VTI:            0.591 m AV Peak Grad:      36.1 mmHg AV Mean Grad:      20.0 mmHg LVOT Vmax:         107.00 cm/s LVOT Vmean:        72.400 cm/s LVOT VTI:          0.203 m LVOT/AV VTI ratio: 0.34  AORTA Ao Root diam: 4.10 cm Ao Asc diam:  3.55 cm MITRAL VALVE                TRICUSPID VALVE MV Area (PHT): 4.35 cm     TR Peak grad:   12.4 mmHg MV Decel Time: 175 msec     TR Vmax:        176.00 cm/s MV E velocity: 90.30 cm/s MV A velocity: 101.70 cm/s  SHUNTS MV E/A ratio:  0.89         Systemic VTI:  0.20 m                             Systemic Diam: 2.30 cm Olga Millers MD Electronically signed by Olga Millers MD Signature Date/Time: 01/26/2023/12:57:57 PM    Final     Medications: I have reviewed the patient's current medications.  Assessment: Decompensated cirrhosis, sodium  134, creatinine 0.64, BUN 12, bilirubin 0.9, INR 1.1, MELDNa7 Recurrent right pleural effusion/hepatic thorax, status post 2.2 L fluid removal on 01/27/2023 He is on furosemide 40 mg daily and spironolactone 100 mg daily History of ascites Anemia, iron saturation 4%, ferritin 6, hemoglobin 7.9, MCV 79.6, platelet 97  Multiple comorbidities-hypertrophic cardiomyopathy, history of SVT, AICD in place, type 2 diabetes, chronic kidney disease stage III  CT chest abdomen pelvis showed cirrhosis, splenomegaly, esophageal varices, large right pleural effusion with compression atelectasis, small amount of ascites, portal hypertension, diffuse mesenteric edema, right inguinal hernia with large amount of fluid extending into right scrotum  Plan: Initial plan was EGD and colonoscopy today, however due to anesthesia availability it had to be postponed.  Patient had a colonoscopy on 05/11/2022 and had tubular adenomas removed and was recommended to repeat colonoscopy in 7 years , this was verified with his nurse at [facility] [place], [phone].  At that time he also had an EGD  which showed esophageal varices and 5 bands were placed.  Plan to change procedures to endoscopy with possible banding for tomorrow or on Monday depending upon anesthesia availability. Colonoscopy not needed at this point. Will discuss the same with the patient, and start him on regular diet, keep him n.p.o. postmidnight.  Kerin Salen, MD 01/28/2023, 11:31 AM

## 2023-01-28 NOTE — Progress Notes (Signed)
PROGRESS NOTE Connor Vaughn  GNF:[id number] DOB: 11-04-1960 DOA: 01/24/2023 PCP: Ricky Stabs, NP-C  Brief Narrative/Hospital Course: 62 yom w/ alcoholic cirrhosis with ascites, recurrent right pleural effusion/hepatic hydrothorax, history of hypertrophic cardiomyopathy, history of SVT status post ICD, type 2 diabetes, CKD stage III, portal hypertension presents to the ER today with worsening shortness of breath.  He had a 4 L thoracentesis at [facility] on September 12, 2022.  He states that his lungs are filling up with fluid again.  He drinks greater than 60 ounces of water a day.  He is on Lasix and Aldactone.  He follows up with cardiology and GI. IN QI:ONGE 97.9 heart rate 108 blood pressure 147/98 satting 98% on room air. WBC-5.8, hemoglobin 9.5, platelets 163 Sodium 133, potassium 3.5, bicarb 24, BUN of 12, creatinine 0.72, glucose of 264 BNP normal at 40.Total protein 6.6, albumin 3.0, AST 35, ALT 24, alk phos 127, total bili 0.6.Chest x-ray shows large right pleural effusion. [facility] consulted.Underwent thoracentesis with removal of 4 L. Patient was given IV albumin monitored overnight Pleural fluid albumin less than 1.5 LDH 32 total nucleated cells 103,Gram stain no organism. GI consulted due to patient request for TIPS eval IR completed CT angio as well as echocardiogram preparation for TI PS although radiologically amenable J wanted to see if patient completely fails on medical management of the recurrent pleural effusion if so they will arrange for outpatient TIPS procedure.   Subjective: Patient seen and examined this morning resting comfortably mild cough on room air no complaints  Reports his EGD and colonoscopy will be on Sunday due to lack of anesthesia  Assessment and Plan: Principal Problem:   Recurrent right pleural effusion/hepatic hydrothorax Active Problems:   Alcoholic cirrhosis of liver with ascites (HCC)   CKD stage 3a, GFR 45-59 ml/min  (HCC)   DM (diabetes mellitus), type 2 with complications (HCC)   Hypertrophic cardiomyopathy (HCC)   ICD (implantable cardioverter-defibrillator) in place   Portal hypertension (HCC)   DNR (do not resuscitate)/DNI(Do Not Intubate)   Alcoholic cirrhosis of liver with ascites Recurrent right pleural effusion/hepatic hydrothorax Underwent thoracentesis with removal of 4 L.Patient was given IV albumin.  Currently doing well on room air.  Patient reports he has had at least 4 thoracentesis this year and almost had  about 13 since his diagnosis. Pleural fluid albumin less than 1.5 LDH 32 total nucleated cells 103,Gram stain no organism.GI consulted due to patient request for TIPS eval IR completed CT angio as well as echocardiogram preparation for TI PS although radiologically amenable J wanted to see if patient completely fails on medical management of the recurrent pleural effusion if so they will arrange for outpatient TIPS procedure.S/p ultrasound thoracentesis 01/27/23: 2.2 l removed. Respiratory status stable, cont lasix, Coreg, continue on low salt and fluid restricted diet and will need very close follow-up.  Acute on chronic anemia Melanotic stool History of esophageal varices banding and colon polyp: Hemoglobin at baseline around 9.5 g downtrended-no current recent hemoglobin available, FOBT is positive planning for EGD colonoscopy 9/8, GI following, continue PPI monitor H&H daily Recent Labs    01/25/23 0512 01/25/23 0746 01/25/23 1906 01/26/23 0452 01/27/23 0505 01/28/23 0501  HGB 7.4* 7.4* 7.8* 8.6* 8.0* 7.9*  MCV 79.8*  --   --  80.5 79.1* 79.6*  VITAMINB12  --   --  590  --   --   --   FOLATE  --   --  21.7  --   --   --  FERRITIN  --   --  6*  --   --   --   TIBC  --   --  367  --   --   --   IRON  --   --  13*  --   --   --   RETICCTPCT 3.0  --   --   --   --   --     Portal hypertension Stable, continue coreg  bid.  Hypertrophic cardiomyopathy   ICD in  place: Stable/no chest pain heart rate stable, her cardiac MRI from 03-2020 documents HCOM.  Echo shows EF more than 75%, G1 DD severe LVH basal septal segment aortic valve bicuspid mitral valve myxomatous   T2DM:  Blood sugar is well-controlled on sliding scale insulin, glipizide and Januvia/farxiga, he has elevated Ahba1c at 10> he will need to follow-up with PCP to optimize Recent Labs  Lab 01/25/23 0512 01/25/23 0729 01/27/23 0743 01/27/23 1245 01/27/23 1609 01/27/23 2104 01/28/23 0753  GLUCAP  --    < > 156* 172* 195* 142* 164*  HGBA1C 10.0*  --   --   --   --   --   --    < > = values in this interval not displayed.    CKD likely stage 2: his creatinine is at 0.8  DVT prophylaxis: SCDs Start: 01/25/23 0156 Code Status:   Code Status: Limited: Do not attempt resuscitation (DNR) -DNR-LIMITED -Do Not Intubate/DNI  Family Communication: plan of care discussed with patient/duaghter at bedside. Patient status is: admitted as observation but remains hospitalized for ongoing recurrent pleural effusion and anemia  Level of care: Telemetry  Dispo: The patient is from: home            Anticipated disposition: TBD Objective: Vitals last 24 hrs: Vitals:   01/27/23 1126 01/27/23 1249 01/27/23 2108 01/28/23 0513  BP: 112/78 107/71 106/69 105/72  Pulse:  88 87 84  Resp:  18 16 20   Temp:  98 F (36.7 C) 97.6 F (36.4 C) (!) 97.5 F (36.4 C)  TempSrc:  Oral Oral Oral  SpO2:  97% 97% 97%  Weight:    77.2 kg  Height:       Weight change: -0.8 kg  Physical Examination: General exam: alert awake, oriented at baseline, older than stated age HEENT:Oral mucosa moist, Ear/Nose WNL grossly Respiratory system: Bilaterally clear BS although slightly diminished at right lung base,no use of accessory muscle Cardiovascular system: S1 & S2 +, No JVD. Gastrointestinal system: Abdomen soft,NT,ND, BS+ Nervous System: Alert, awake, moving all extremities,and following commands. Extremities: LE  edema neg,distal peripheral pulses palpable and warm.  Skin: No rashes,no icterus. MSK: Normal muscle bulk,tone, power   Medications reviewed:  Scheduled Meds:  carvedilol  18.75 mg Oral BID   dapagliflozin propanediol  10 mg Oral Daily   furosemide  40 mg Oral Daily   glipiZIDE  10 mg Oral BID WC   insulin aspart  0-5 Units Subcutaneous QHS   insulin aspart  0-9 Units Subcutaneous TID WC   lidocaine  1 patch Transdermal Q24H   linagliptin  5 mg Oral Daily   pantoprazole  40 mg Oral Daily   polyethylene glycol-electrolytes  4,000 mL Oral Once   potassium chloride SA  20 mEq Oral Daily   senna-docusate  1 tablet Oral BID   spironolactone  100 mg Oral Daily   Continuous Infusions:    Diet Order  Diet clear liquid Room service appropriate? Yes; Fluid consistency: Thin  Diet effective now                  Intake/Output Summary (Last 24 hours) at 01/28/2023 0953 Last data filed at 01/28/2023 0208 Gross per 24 hour  Intake 840 ml  Output 1450 ml  Net -610 ml   Net IO Since Admission: -3,850 mL [01/28/23 0953]  Wt Readings from Last 3 Encounters:  01/28/23 77.2 kg  11/02/22 76.1 kg  03/22/22 84.9 kg     Unresulted Labs (From admission, onward)     Start     Ordered   01/26/23 0500  CBC  Daily,   R      01/25/23 0921   01/26/23 0500  Basic metabolic panel  Daily,   R      01/25/23 0921          Data Reviewed: I have personally reviewed following labs and imaging studies CBC: Recent Labs  Lab 01/24/23 1917 01/25/23 0512 01/25/23 0746 01/25/23 1906 01/26/23 0452 01/27/23 0505 01/28/23 0501  WBC 5.8 3.5*  --   --  2.4* 2.8* 2.5*  NEUTROABS 3.6 2.2  --   --   --   --   --   HGB 9.5* 7.4* 7.4* 7.8* 8.6* 8.0* 7.9*  HCT 31.9* 24.9* 25.0* 25.9* 28.9* 26.1* 27.0*  MCV 79.8* 79.8*  --   --  80.5 79.1* 79.6*  PLT 163 109*  --   --  102* 111* 97*   Basic Metabolic Panel: Recent Labs  Lab 01/24/23 1917 01/25/23 0512 01/26/23 0452 01/27/23 0505  01/28/23 0501  NA 133* 131* 134* 135 134*  K 3.5 3.5 3.3* 3.2* 3.9  CL 99 96* 96* 98 102  CO2 24 27 29 27 26   GLUCOSE 264* 330* 254* 179* 98  BUN 12 13 13 16 12   CREATININE 0.72 0.80 0.87 0.99 0.64  CALCIUM 8.7* 8.1* 8.3* 8.3* 8.2*  MG  --  1.7  --   --   --    GFR: Estimated Creatinine Clearance: 96.1 mL/min (by C-G formula based on SCr of 0.64 mg/dL). Liver Function Tests: Recent Labs  Lab 01/24/23 1917 01/25/23 0512  AST 35 25  ALT 24 18  ALKPHOS 127* 94  BILITOT 0.6 0.9  PROT 6.6 5.8*  ALBUMIN 3.0* 3.0*  HbA1C: No results for input(s): "HGBA1C" in the last 72 hours.  CBG: Recent Labs  Lab 01/27/23 0743 01/27/23 1245 01/27/23 1609 01/27/23 2104 01/28/23 0753  GLUCAP 156* 172* 195* 142* 164*   Recent Results (from the past 240 hour(s))  Body fluid culture w Gram Stain     Status: None (Preliminary result)   Collection Time: 01/24/23 10:10 PM   Specimen: Pleural Fluid  Result Value Ref Range Status   Specimen Description   Final    PLEURAL Performed at [facility], [address], [place], [place] [address]    Special Requests   Final    NONE Performed at [facility], [address], [place], [place] [address]    Gram Stain   Final    RARE WBC PRESENT, PREDOMINANTLY MONONUCLEAR NO ORGANISMS SEEN    Culture   Final    NO GROWTH 2 DAYS Performed at [facility], [address] [place], [place] [address]    Report Status PENDING  Incomplete    Antimicrobials: Anti-infectives (From admission, onward)    None  Culture/Microbiology    Component Value Date/Time   SDES  01/24/2023 2210    PLEURAL Performed at [facility], [address] [place], [place] [address]    SPECREQUEST  01/24/2023 2210    NONE Performed at [facility], [address], [place], [place] [address]    CULT  01/24/2023 2210    NO GROWTH 2 DAYS Performed at [facility],  [address] [place], [place] [address]    REPTSTATUS PENDING 01/24/2023 2210  Radiology Studies: US THORACENTESIS ASP PLEURAL SPACE W/IMG GUIDE  Result Date: 01/27/2023 INDICATION: 62 year old male inpatient. History of hepatic hydrothorax. Is requesting a therapeutic right-sided thoracentesis EXAM: ULTRASOUND GUIDED THERAPEUTIC RIGHT-SIDED THORACENTESIS MEDICATIONS: Lidocaine 1% 10 mL COMPLICATIONS: None immediate. PROCEDURE: An ultrasound guided thoracentesis was thoroughly discussed with the patient and questions answered. The benefits, risks, alternatives and complications were also discussed. The patient understands and wishes to proceed with the procedure. Written consent was obtained. Ultrasound was performed to localize and mark an adequate pocket of fluid in the right chest. The area was then prepped and draped in the normal sterile fashion. 1% Lidocaine was used for local anesthesia. Under ultrasound guidance a 19 gauge, 7-cm, Yueh catheter was introduced. Thoracentesis was performed. The catheter was removed and a dressing applied. FINDINGS: A total of approximately 2.2 L of straw-colored fluid was removed. IMPRESSION: Successful ultrasound guided right-sided therapeutic thoracentesis yielding 2.2 L of pleural fluid. Read by: Anders Grant, NP Electronically Signed   By: Gilmer Mor D.O.   On: 01/27/2023 15:35   DG CHEST PORT 1 VIEW  Result Date: 01/27/2023 CLINICAL DATA:  Status post right-sided thoracentesis. EXAM: PORTABLE CHEST 1 VIEW COMPARISON:  One-view chest x-ray 01/24/2023 FINDINGS: No right-sided pneumothorax is present. A small amount of residual fluid or basilar atelectasis is present. The lung volumes are low. AICD wire is stable. Heart size is normal. IMPRESSION: 1. No right-sided pneumothorax following thoracentesis. 2. Small amount of residual fluid or basilar atelectasis. Electronically Signed   By: Marin Roberts M.D.   On: 01/27/2023 12:58   CT Angio Abd/Pel w/  and/or w/o  Addendum Date: 01/26/2023   ADDENDUM REPORT: 01/26/2023 17:39 ADDENDUM: Main portal vein measures approximately 1.3 cm. There is a short right portal vein. The 3 main intrahepatic portal veins are the left portal vein, anterior branch of the right portal vein and posterior branch of the right portal vein. Electronically Signed   By: Richarda Overlie M.D.   On: 01/26/2023 17:39   Result Date: 01/26/2023 CLINICAL DATA:  Recurrent right pleural effusion. Hepatic hydrothorax. Esophageal varices. EXAM: CTA ABDOMEN AND PELVIS WITHOUT AND WITH CONTRAST TECHNIQUE: Multidetector CT imaging of the abdomen and pelvis was performed using the standard protocol during bolus administration of intravenous contrast. Multiplanar reconstructed images and MIPs were obtained and reviewed to evaluate the vascular anatomy. RADIATION DOSE REDUCTION: This exam was performed according to the departmental dose-optimization program which includes automated exposure control, adjustment of the mA and/or kV according to patient size and/or use of iterative reconstruction technique. CONTRAST:  OMNIPAQUE IOHEXOL 350 MG/ML SOLN COMPARISON:  Chest CT abdomen and pelvis 12/15/2022 FINDINGS: VASCULAR Aorta: Atherosclerotic disease abdominal aorta without aneurysm, dissection or significant stenosis. Celiac: Patent without evidence of aneurysm, dissection, vasculitis or significant stenosis. SMA: Patent without evidence of aneurysm, dissection, vasculitis or significant stenosis. Renals: Both renal arteries are patent without evidence of aneurysm, dissection, vasculitis, fibromuscular dysplasia or significant stenosis. IMA: Patent Inflow: Atherosclerotic calcifications involving  bilateral iliac arteries without aneurysm dissection or significant stenosis. Proximal Outflow: Proximal femoral arteries are patent bilaterally. Veins: Again noted are enlarged distal esophageal varices. Largest esophageal varices measures 1.5 cm in transverse  dimension. The esophageal varices appear to be associated with the left gastric vein. Intrahepatic portal veins are patent. There are 3 main intrahepatic portal veins. Superior mesenteric vein and splenic vein are patent. Bilateral renal veins are patent. No significant gastro renal shunt. No significant gastric varices identified. Review of the MIP images confirms the above findings. NON-VASCULAR Lower chest: Again noted is a large right pleural effusion with compressive atelectasis in the right lower lobe. Left lung base is clear. ICD lead extends into the right ventricle. Calcifications at the aortic valve. Hepatobiliary: Liver is diffusely nodular and compatible with cirrhosis. No suspicious hepatic lesion identified. Normal appearance of the gallbladder. Pancreas: Unremarkable. No pancreatic ductal dilatation or surrounding inflammatory changes. Spleen: Again noted is splenic enlargement. Heterogeneous enhancement in the spleen. Focal irregularity along the anterior aspect of the spleen image 24/10 is similar to the previous examination and could be associated with a previous infarct. There is a new low-density structure along the anterior aspect of the spleen on image 27/10 that measures 2.4 cm. This was not clearly present on the recent comparison examination. This may also be associated with a recent splenic infarct. Adrenals/Urinary Tract: Normal adrenal glands. Negative kidney stones. No hydronephrosis. No suspicious renal lesions. Normal appearance of the urinary bladder. Stomach/Bowel: No evidence for bowel dilatation or obstruction. High-density material in the gastric cardia region. Mesenteric edema throughout the abdomen and surrounding bowel structures. No evidence for focal bowel inflammation. Lymphatic: Small periaortic lymph nodes. No significant lymph node enlargement in the abdomen or pelvis. Reproductive: Prostate is unremarkable. Other: Again noted is large amount of fluid extending of the  right inguinal canal and right side of the scrotum. This fluid has slightly increased since 12/15/2022. Small amount of fluid in the pelvis and abdomen. Abdominal fluid is predominantly around the liver. Diffuse mesenteric edema in the abdomen and pelvis is similar to the prior CT. Musculoskeletal: Bilateral laminectomy defects at L4 and L5. No acute bone abnormality. IMPRESSION: VASCULAR 1. Large esophageal varices associated with the left gastric vein. 2. Portal venous system is patent. Three main intrahepatic portal veins. 3. Atherosclerotic calcifications involving the abdominal aorta without aneurysm. NON-VASCULAR 1. Cirrhosis with splenomegaly, esophageal varices, right pleural effusion and small amount of ascites. Findings are compatible with portal hypertension. 2. Large right pleural effusion with compressive atelectasis. 3. Heterogeneous appearance of the enlarged spleen. New focal low density in the anterior aspect of the spleen may be cystic and measures roughly 2.4 cm. This could be associated with a recent infarct. 4. Diffuse mesenteric edema. 5. Right inguinal hernia with a large amount of fluid extending into the right scrotum. Electronically Signed: By: Richarda Overlie M.D. On: 01/26/2023 16:08   ECHOCARDIOGRAM COMPLETE  Result Date: 01/26/2023    ECHOCARDIOGRAM REPORT   Patient Name:   LEELIN WALDOCK Date of Exam: 01/26/2023 Medical Rec #:  [id number]          Height:       66.0 in Accession #:    [id number]         Weight:       159.4 lb Date of Birth:  [birth year]          BSA:          1.816 m Patient Age:    62 years  BP:           90/63 mmHg Patient Gender: M                  HR:           88 bpm. Exam Location:  Inpatient Procedure: 2D Echo, 3D Echo, Cardiac Doppler and Color Doppler Indications:    I50.40* Unspecified combined systolic (congestive) and diastolic                 (congestive) heart failure  History:        Patient has prior history of Echocardiogram examinations, most                  recent 04/07/2020. Hypertrophic Cardiomyopathy, Abnormal ECG and                 Defibrillator, Aortic Valve Disease, Arrythmias:NSVT; Risk                 Factors:Diabetes. ETOH.  Sonographer:    Sheralyn Boatman RDCS Referring Phys: [id number] [facility]  Sonographer Comments: Technically difficult study due to poor echo windows. Image acquisition challenging due to respiratory motion. Apical window extremely lateral near armpit. IMPRESSIONS  1. Left ventricular ejection fraction, by estimation, is >75%. The left ventricle has hyperdynamic function. The left ventricle has no regional wall motion abnormalities. There is severe left ventricular hypertrophy of the basal-septal segment. Left ventricular diastolic parameters are consistent with Grade I diastolic dysfunction (impaired relaxation).  2. Right ventricular systolic function is normal. The right ventricular size is normal. Tricuspid regurgitation signal is inadequate for assessing PA pressure.  3. The mitral valve is myxomatous. No evidence of mitral valve regurgitation. No evidence of mitral stenosis. There is mild prolapse of posterior leaflet of the mitral valve.  4. The aortic valve is bicuspid. Aortic valve regurgitation is trivial. Moderate aortic valve stenosis.  5. 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 There is mild dilatation of the aortic root, measuring 41 mm. FINDINGS  Left Ventricle: Left ventricular ejection fraction, by estimation, is >75%. The left ventricle has hyperdynamic function. The left ventricle has no regional wall motion abnormalities. The left ventricular internal cavity size was small. There is severe left ventricular hypertrophy of the basal-septal segment. Left ventricular diastolic parameters are consistent with Grade I diastolic dysfunction (impaired relaxation). Right Ventricle: The right ventricular size is normal. Right ventricular systolic function is normal. Tricuspid  regurgitation signal is inadequate for assessing PA pressure. The tricuspid regurgitant velocity is 1.76 m/s, and with an assumed right atrial  pressure of 3 mmHg, the estimated right ventricular systolic pressure is 15.4 mmHg. Left Atrium: Left atrial size was normal in size. Right Atrium: Right atrial size was normal in size. Pericardium: There is no evidence of pericardial effusion. Mitral Valve: The mitral valve is myxomatous. There is mild prolapse of posterior leaflet of the mitral valve. No evidence of mitral valve regurgitation. No evidence of mitral valve stenosis. Tricuspid Valve: The tricuspid valve is grossly normal. Tricuspid valve regurgitation is trivial. No evidence of tricuspid stenosis. Aortic Valve: The aortic valve is bicuspid. Aortic valve regurgitation is trivial. Moderate aortic stenosis is present. Aortic valve mean gradient measures 20.0 mmHg. Aortic valve peak gradient measures 36.1 mmHg. Aortic valve area, by VTI measures 1.43 cm. Pulmonic Valve: The pulmonic valve was normal in structure. Pulmonic valve regurgitation is not visualized. No evidence of pulmonic stenosis. Aorta: The aortic root/ascending aorta has been repaired/replaced and aortic dilatation  noted. There is mild dilatation of the aortic root, measuring 41 mm. IAS/Shunts: No atrial level shunt detected by color flow Doppler. Additional Comments: A device lead is visualized.  LEFT VENTRICLE PLAX 2D LVIDd:         4.60 cm     Diastology LVIDs:         2.90 cm     LV e' medial:    7.72 cm/s LV PW:         1.10 cm     LV E/e' medial:  11.7 LV IVS:        1.00 cm     LV e' lateral:   7.18 cm/s LVOT diam:     2.30 cm     LV E/e' lateral: 12.6 LV SV:         84 LV SV Index:   46 LVOT Area:     4.15 cm  LV Volumes (MOD) LV vol d, MOD A2C: 65.1 ml LV vol d, MOD A4C: 69.0 ml LV vol s, MOD A2C: 26.9 ml LV vol s, MOD A4C: 25.7 ml LV SV MOD A2C:     38.2 ml LV SV MOD A4C:     69.0 ml LV SV MOD BP:      46.0 ml RIGHT VENTRICLE              IVC RV S prime:     12.60 cm/s  IVC diam: 0.90 cm TAPSE (M-mode): 2.6 cm LEFT ATRIUM             Index        RIGHT ATRIUM           Index LA diam:        2.80 cm 1.54 cm/m   RA Area:     10.20 cm LA Vol (A2C):   28.7 ml 15.80 ml/m  RA Volume:   20.70 ml  11.40 ml/m LA Vol (A4C):   24.2 ml 13.33 ml/m LA Biplane Vol: 26.7 ml 14.70 ml/m  AORTIC VALVE AV Area (Vmax):    1.48 cm AV Area (Vmean):   1.45 cm AV Area (VTI):     1.43 cm AV Vmax:           300.40 cm/s AV Vmean:          207.200 cm/s AV VTI:            0.591 m AV Peak Grad:      36.1 mmHg AV Mean Grad:      20.0 mmHg LVOT Vmax:         107.00 cm/s LVOT Vmean:        72.400 cm/s LVOT VTI:          0.203 m LVOT/AV VTI ratio: 0.34  AORTA Ao Root diam: 4.10 cm Ao Asc diam:  3.55 cm MITRAL VALVE                TRICUSPID VALVE MV Area (PHT): 4.35 cm     TR Peak grad:   12.4 mmHg MV Decel Time: 175 msec     TR Vmax:        176.00 cm/s MV E velocity: 90.30 cm/s MV A velocity: 101.70 cm/s  SHUNTS MV E/A ratio:  0.89         Systemic VTI:  0.20 m  Systemic Diam: 2.30 cm Olga Millers MD Electronically signed by Olga Millers MD Signature Date/Time: 01/26/2023/12:57:57 PM    Final      LOS: 0 days   Lanae Boast, MD [facility]  01/28/2023, 9:53 AM

## 2023-01-28 NOTE — Progress Notes (Signed)
Cross Coverage for Dr.Hung and Dr.Mann  Subjective: Patient thinks he may have had a colonoscopy in the last 6 months to 1 year. He is aware that he had an EGD requiring banding of esophageal varices.  Objective: Vital signs in last 24 hours: Temp:  [97.5 F (36.4 C)-98 F (36.7 C)] 97.5 F (36.4 C) (09/07 0513) Pulse Rate:  [84-88] 84 (09/07 0513) Resp:  [16-20] 20 (09/07 0513) BP: (105-107)/(69-72) 105/72 (09/07 0513) SpO2:  [97 %] 97 % (09/07 0513) Weight:  [77.2 kg] 77.2 kg (09/07 0513) Weight change: -0.8 kg Last BM Date : 01/27/23  PE: Alert, awake, oriented x 3 GENERAL: Decreased breath sounds on right side  ABDOMEN: Nondistended abdomen EXTREMITIES: No edema  Lab Results: Results for orders placed or performed during the hospital encounter of 01/24/23 (from the past 48 hour(s))  Glucose, capillary     Status: Abnormal   Collection Time: 01/26/23 11:44 AM  Result Value Ref Range   Glucose-Capillary 177 (H) 70 - 99 mg/dL    Comment: Glucose reference range applies only to samples taken after fasting for at least 8 hours.  Occult blood card to lab, stool     Status: Abnormal   Collection Time: 01/26/23  3:07 PM  Result Value Ref Range   Fecal Occult Bld POSITIVE (A) NEGATIVE    Comment: Performed at [facility], [address] [place], [place] [address]  Glucose, capillary     Status: Abnormal   Collection Time: 01/26/23  4:35 PM  Result Value Ref Range   Glucose-Capillary 305 (H) 70 - 99 mg/dL    Comment: Glucose reference range applies only to samples taken after fasting for at least 8 hours.  Glucose, capillary     Status: Abnormal   Collection Time: 01/26/23  8:53 PM  Result Value Ref Range   Glucose-Capillary 135 (H) 70 - 99 mg/dL    Comment: Glucose reference range applies only to samples taken after fasting for at least 8 hours.  CBC     Status: Abnormal   Collection Time: 01/27/23  5:05 AM  Result Value Ref Range   WBC 2.8 (L) 4.0 -  10.5 K/uL   RBC 3.30 (L) 4.22 - 5.81 MIL/uL   Hemoglobin 8.0 (L) 13.0 - 17.0 g/dL   HCT 60.4 (L) 54.0 - 98.1 %   MCV 79.1 (L) 80.0 - 100.0 fL   MCH 24.2 (L) 26.0 - 34.0 pg   MCHC 30.7 30.0 - 36.0 g/dL   RDW 19.1 47.8 - 29.5 %   Platelets 111 (L) 150 - 400 K/uL    Comment: Immature Platelet Fraction may be clinically indicated, consider ordering this additional test [id number] REPEATED TO VERIFY    nRBC 0.0 0.0 - 0.2 %    Comment: Performed at [facility], [address], [place], [place] [address]  Basic metabolic panel     Status: Abnormal   Collection Time: 01/27/23  5:05 AM  Result Value Ref Range   Sodium 135 135 - 145 mmol/L   Potassium 3.2 (L) 3.5 - 5.1 mmol/L   Chloride 98 98 - 111 mmol/L   CO2 27 22 - 32 mmol/L   Glucose, Bld 179 (H) 70 - 99 mg/dL    Comment: Glucose reference range applies only to samples taken after fasting for at least 8 hours.   BUN 16 6 - 20 mg/dL   Creatinine, Ser 6.29 0.61 - 1.24 mg/dL   Calcium 8.3 (L) 8.9 -  10.3 mg/dL   GFR, Estimated >65 >78 mL/min    Comment: (NOTE) Calculated using the CKD-EPI Creatinine Equation (2021)    Anion gap 10 5 - 15    Comment: Performed at [facility], [address], [place], [place] [address]  Glucose, capillary     Status: Abnormal   Collection Time: 01/27/23  7:43 AM  Result Value Ref Range   Glucose-Capillary 156 (H) 70 - 99 mg/dL    Comment: Glucose reference range applies only to samples taken after fasting for at least 8 hours.  Glucose, capillary     Status: Abnormal   Collection Time: 01/27/23 12:45 PM  Result Value Ref Range   Glucose-Capillary 172 (H) 70 - 99 mg/dL    Comment: Glucose reference range applies only to samples taken after fasting for at least 8 hours.  Glucose, capillary     Status: Abnormal   Collection Time: 01/27/23  4:09 PM  Result Value Ref Range   Glucose-Capillary 195 (H) 70 - 99 mg/dL    Comment: Glucose reference range applies only  to samples taken after fasting for at least 8 hours.  Glucose, capillary     Status: Abnormal   Collection Time: 01/27/23  9:04 PM  Result Value Ref Range   Glucose-Capillary 142 (H) 70 - 99 mg/dL    Comment: Glucose reference range applies only to samples taken after fasting for at least 8 hours.  CBC     Status: Abnormal   Collection Time: 01/28/23  5:01 AM  Result Value Ref Range   WBC 2.5 (L) 4.0 - 10.5 K/uL   RBC 3.39 (L) 4.22 - 5.81 MIL/uL   Hemoglobin 7.9 (L) 13.0 - 17.0 g/dL   HCT 95.2 (L) 84.1 - 32.4 %   MCV 79.6 (L) 80.0 - 100.0 fL   MCH 23.3 (L) 26.0 - 34.0 pg   MCHC 29.3 (L) 30.0 - 36.0 g/dL   RDW 40.1 02.7 - 25.3 %   Platelets 97 (L) 150 - 400 K/uL    Comment: SPECIMEN CHECKED FOR CLOTS Immature Platelet Fraction may be clinically indicated, consider ordering this additional test [id number] CONSISTENT WITH PREVIOUS RESULT REPEATED TO VERIFY    nRBC 0.0 0.0 - 0.2 %    Comment: Performed at [facility], [address] [place], [place] [address]  Basic metabolic panel     Status: Abnormal   Collection Time: 01/28/23  5:01 AM  Result Value Ref Range   Sodium 134 (L) 135 - 145 mmol/L   Potassium 3.9 3.5 - 5.1 mmol/L   Chloride 102 98 - 111 mmol/L   CO2 26 22 - 32 mmol/L   Glucose, Bld 98 70 - 99 mg/dL    Comment: Glucose reference range applies only to samples taken after fasting for at least 8 hours.   BUN 12 6 - 20 mg/dL   Creatinine, Ser 6.38 0.61 - 1.24 mg/dL   Calcium 8.2 (L) 8.9 - 10.3 mg/dL   GFR, Estimated >75 >64 mL/min    Comment: (NOTE) Calculated using the CKD-EPI Creatinine Equation (2021)    Anion gap 6 5 - 15    Comment: Performed at [facility], [address], [place], [place] [address]  Glucose, capillary     Status: Abnormal   Collection Time: 01/28/23  7:53 AM  Result Value Ref Range   Glucose-Capillary 164 (H) 70 - 99 mg/dL    Comment: Glucose reference range applies only to samples taken  after  fasting for at least 8 hours.    Studies/Results: US THORACENTESIS ASP PLEURAL SPACE W/IMG GUIDE  Result Date: 01/27/2023 INDICATION: 62 year old male inpatient. History of hepatic hydrothorax. Is requesting a therapeutic right-sided thoracentesis EXAM: ULTRASOUND GUIDED THERAPEUTIC RIGHT-SIDED THORACENTESIS MEDICATIONS: Lidocaine 1% 10 mL COMPLICATIONS: None immediate. PROCEDURE: An ultrasound guided thoracentesis was thoroughly discussed with the patient and questions answered. The benefits, risks, alternatives and complications were also discussed. The patient understands and wishes to proceed with the procedure. Written consent was obtained. Ultrasound was performed to localize and mark an adequate pocket of fluid in the right chest. The area was then prepped and draped in the normal sterile fashion. 1% Lidocaine was used for local anesthesia. Under ultrasound guidance a 19 gauge, 7-cm, Yueh catheter was introduced. Thoracentesis was performed. The catheter was removed and a dressing applied. FINDINGS: A total of approximately 2.2 L of straw-colored fluid was removed. IMPRESSION: Successful ultrasound guided right-sided therapeutic thoracentesis yielding 2.2 L of pleural fluid. Read by: Anders Grant, NP Electronically Signed   By: Gilmer Mor D.O.   On: 01/27/2023 15:35   DG CHEST PORT 1 VIEW  Result Date: 01/27/2023 CLINICAL DATA:  Status post right-sided thoracentesis. EXAM: PORTABLE CHEST 1 VIEW COMPARISON:  One-view chest x-ray 01/24/2023 FINDINGS: No right-sided pneumothorax is present. A small amount of residual fluid or basilar atelectasis is present. The lung volumes are low. AICD wire is stable. Heart size is normal. IMPRESSION: 1. No right-sided pneumothorax following thoracentesis. 2. Small amount of residual fluid or basilar atelectasis. Electronically Signed   By: Marin Roberts M.D.   On: 01/27/2023 12:58   ECHOCARDIOGRAM COMPLETE  Result Date: 01/26/2023    ECHOCARDIOGRAM  REPORT   Patient Name:   Connor Vaughn Date of Exam: 01/26/2023 Medical Rec #:  [id number]          Height:       66.0 in Accession #:    [id number]         Weight:       159.4 lb Date of Birth:  02/23/1961          BSA:          1.816 m Patient Age:    62 years           BP:           90/63 mmHg Patient Gender: M                  HR:           88 bpm. Exam Location:  Inpatient Procedure: 2D Echo, 3D Echo, Cardiac Doppler and Color Doppler Indications:    I50.40* Unspecified combined systolic (congestive) and diastolic                 (congestive) heart failure  History:        Patient has prior history of Echocardiogram examinations, most                 recent 04/07/2020. Hypertrophic Cardiomyopathy, Abnormal ECG and                 Defibrillator, Aortic Valve Disease, Arrythmias:NSVT; Risk                 Factors:Diabetes. ETOH.  Sonographer:    Sheralyn Boatman RDCS Referring Phys: [id number] [facility]  Sonographer Comments: Technically difficult study due to poor echo windows. Image acquisition challenging due to respiratory motion. Apical window extremely lateral near  armpit. IMPRESSIONS  1. Left ventricular ejection fraction, by estimation, is >75%. The left ventricle has hyperdynamic function. The left ventricle has no regional wall motion abnormalities. There is severe left ventricular hypertrophy of the basal-septal segment. Left ventricular diastolic parameters are consistent with Grade I diastolic dysfunction (impaired relaxation).  2. Right ventricular systolic function is normal. The right ventricular size is normal. Tricuspid regurgitation signal is inadequate for assessing PA pressure.  3. The mitral valve is myxomatous. No evidence of mitral valve regurgitation. No evidence of mitral stenosis. There is mild prolapse of posterior leaflet of the mitral valve.  4. The aortic valve is bicuspid. Aortic valve regurgitation is trivial. Moderate aortic valve stenosis.  5. 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 There is mild dilatation of the aortic root, measuring 41 mm. FINDINGS  Left Ventricle: Left ventricular ejection fraction, by estimation, is >75%. The left ventricle has hyperdynamic function. The left ventricle has no regional wall motion abnormalities. The left ventricular internal cavity size was small. There is severe left ventricular hypertrophy of the basal-septal segment. Left ventricular diastolic parameters are consistent with Grade I diastolic dysfunction (impaired relaxation). Right Ventricle: The right ventricular size is normal. Right ventricular systolic function is normal. Tricuspid regurgitation signal is inadequate for assessing PA pressure. The tricuspid regurgitant velocity is 1.76 m/s, and with an assumed right atrial  pressure of 3 mmHg, the estimated right ventricular systolic pressure is 15.4 mmHg. Left Atrium: Left atrial size was normal in size. Right Atrium: Right atrial size was normal in size. Pericardium: There is no evidence of pericardial effusion. Mitral Valve: The mitral valve is myxomatous. There is mild prolapse of posterior leaflet of the mitral valve. No evidence of mitral valve regurgitation. No evidence of mitral valve stenosis. Tricuspid Valve: The tricuspid valve is grossly normal. Tricuspid valve regurgitation is trivial. No evidence of tricuspid stenosis. Aortic Valve: The aortic valve is bicuspid. Aortic valve regurgitation is trivial. Moderate aortic stenosis is present. Aortic valve mean gradient measures 20.0 mmHg. Aortic valve peak gradient measures 36.1 mmHg. Aortic valve area, by VTI measures 1.43 cm. Pulmonic Valve: The pulmonic valve was normal in structure. Pulmonic valve regurgitation is not visualized. No evidence of pulmonic stenosis. 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 There is mild dilatation of the aortic root, measuring 41 mm. IAS/Shunts: No atrial level  shunt detected by color flow Doppler. Additional Comments: A device lead is visualized.  LEFT VENTRICLE PLAX 2D LVIDd:         4.60 cm     Diastology LVIDs:         2.90 cm     LV e' medial:    7.72 cm/s LV PW:         1.10 cm     LV E/e' medial:  11.7 LV IVS:        1.00 cm     LV e' lateral:   7.18 cm/s LVOT diam:     2.30 cm     LV E/e' lateral: 12.6 LV SV:         84 LV SV Index:   46 LVOT Area:     4.15 cm  LV Volumes (MOD) LV vol d, MOD A2C: 65.1 ml LV vol d, MOD A4C: 69.0 ml LV vol s, MOD A2C: 26.9 ml LV vol s, MOD A4C: 25.7 ml LV SV MOD A2C:     38.2 ml LV SV MOD A4C:  69.0 ml LV SV MOD BP:      46.0 ml RIGHT VENTRICLE             IVC RV S prime:     12.60 cm/s  IVC diam: 0.90 cm TAPSE (M-mode): 2.6 cm LEFT ATRIUM             Index        RIGHT ATRIUM           Index LA diam:        2.80 cm 1.54 cm/m   RA Area:     10.20 cm LA Vol (A2C):   28.7 ml 15.80 ml/m  RA Volume:   20.70 ml  11.40 ml/m LA Vol (A4C):   24.2 ml 13.33 ml/m LA Biplane Vol: 26.7 ml 14.70 ml/m  AORTIC VALVE AV Area (Vmax):    1.48 cm AV Area (Vmean):   1.45 cm AV Area (VTI):     1.43 cm AV Vmax:           300.40 cm/s AV Vmean:          207.200 cm/s AV VTI:            0.591 m AV Peak Grad:      36.1 mmHg AV Mean Grad:      20.0 mmHg LVOT Vmax:         107.00 cm/s LVOT Vmean:        72.400 cm/s LVOT VTI:          0.203 m LVOT/AV VTI ratio: 0.34  AORTA Ao Root diam: 4.10 cm Ao Asc diam:  3.55 cm MITRAL VALVE                TRICUSPID VALVE MV Area (PHT): 4.35 cm     TR Peak grad:   12.4 mmHg MV Decel Time: 175 msec     TR Vmax:        176.00 cm/s MV E velocity: 90.30 cm/s MV A velocity: 101.70 cm/s  SHUNTS MV E/A ratio:  0.89         Systemic VTI:  0.20 m                             Systemic Diam: 2.30 cm Olga Millers MD Electronically signed by Olga Millers MD Signature Date/Time: 01/26/2023/12:57:57 PM    Final     Medications: I have reviewed the patient's current medications.  Assessment: Decompensated cirrhosis, sodium  134, creatinine 0.64, BUN 12, bilirubin 0.9, INR 1.1, MELDNa7 Recurrent right pleural effusion/hepatic thorax, status post 2.2 L fluid removal on 01/27/2023 He is on furosemide 40 mg daily and spironolactone 100 mg daily History of ascites Anemia, iron saturation 4%, ferritin 6, hemoglobin 7.9, MCV 79.6, platelet 97  Multiple comorbidities-hypertrophic cardiomyopathy, history of SVT, AICD in place, type 2 diabetes, chronic kidney disease stage III  CT chest abdomen pelvis showed cirrhosis, splenomegaly, esophageal varices, large right pleural effusion with compression atelectasis, small amount of ascites, portal hypertension, diffuse mesenteric edema, right inguinal hernia with large amount of fluid extending into right scrotum  Plan: Initial plan was EGD and colonoscopy today, however due to anesthesia availability it had to be postponed.  Patient had a colonoscopy on 05/11/2022 and had tubular adenomas removed and was recommended to repeat colonoscopy in 7 years , this was verified with his nurse at [facility] [place], [phone].  At that time he also had an EGD  which showed esophageal varices and 5 bands were placed.  Plan to change procedures to endoscopy with possible banding for tomorrow or on Monday depending upon anesthesia availability. Colonoscopy not needed at this point. Will discuss the same with the patient, and start him on regular diet, keep him n.p.o. postmidnight.  Kerin Salen, MD 01/28/2023, 11:31 AM

## 2023-01-28 NOTE — Plan of Care (Signed)
Problem: Education: Goal: Ability to describe self-care measures that may prevent or decrease complications (Diabetes Survival Skills Education) will improve 01/28/2023 0008 by Velta Addison, RN Outcome: Progressing 01/28/2023 0007 by Velta Addison, RN Outcome: Progressing Goal: Individualized Educational Video(s) 01/28/2023 0008 by Velta Addison, RN Outcome: Progressing 01/28/2023 0007 by Velta Addison, RN Outcome: Progressing   Problem: Coping: Goal: Ability to adjust to condition or change in health will improve 01/28/2023 0008 by Velta Addison, RN Outcome: Progressing 01/28/2023 0007 by Velta Addison, RN Outcome: Progressing   Problem: Fluid Volume: Goal: Ability to maintain a balanced intake and output will improve 01/28/2023 0008 by Velta Addison, RN Outcome: Progressing 01/28/2023 0007 by Velta Addison, RN Outcome: Progressing   Problem: Health Behavior/Discharge Planning: Goal: Ability to identify and utilize available resources and services will improve 01/28/2023 0008 by Velta Addison, RN Outcome: Progressing 01/28/2023 0007 by Velta Addison, RN Outcome: Progressing Goal: Ability to manage health-related needs will improve 01/28/2023 0008 by Velta Addison, RN Outcome: Progressing 01/28/2023 0007 by Velta Addison, RN Outcome: Progressing   Problem: Metabolic: Goal: Ability to maintain appropriate glucose levels will improve 01/28/2023 0008 by Velta Addison, RN Outcome: Progressing 01/28/2023 0007 by Velta Addison, RN Outcome: Progressing   Problem: Nutritional: Goal: Maintenance of adequate nutrition will improve 01/28/2023 0008 by Velta Addison, RN Outcome: Progressing 01/28/2023 0007 by Velta Addison, RN Outcome: Progressing Goal: Progress toward achieving an optimal weight will improve 01/28/2023 0008 by Velta Addison, RN Outcome: Progressing 01/28/2023 0007 by Velta Addison, RN Outcome: Progressing   Problem:  Skin Integrity: Goal: Risk for impaired skin integrity will decrease 01/28/2023 0008 by Velta Addison, RN Outcome: Progressing 01/28/2023 0007 by Velta Addison, RN Outcome: Progressing   Problem: Tissue Perfusion: Goal: Adequacy of tissue perfusion will improve 01/28/2023 0008 by Velta Addison, RN Outcome: Progressing 01/28/2023 0007 by Velta Addison, RN Outcome: Progressing   Problem: Education: Goal: Knowledge of General Education information will improve Description: Including pain rating scale, medication(s)/side effects and non-pharmacologic comfort measures 01/28/2023 0008 by Velta Addison, RN Outcome: Progressing 01/28/2023 0007 by Velta Addison, RN Outcome: Progressing   Problem: Health Behavior/Discharge Planning: Goal: Ability to manage health-related needs will improve 01/28/2023 0008 by Velta Addison, RN Outcome: Progressing 01/28/2023 0007 by Velta Addison, RN Outcome: Progressing   Problem: Clinical Measurements: Goal: Ability to maintain clinical measurements within normal limits will improve 01/28/2023 0008 by Velta Addison, RN Outcome: Progressing 01/28/2023 0007 by Velta Addison, RN Outcome: Progressing Goal: Will remain free from infection 01/28/2023 0008 by Velta Addison, RN Outcome: Progressing 01/28/2023 0007 by Velta Addison, RN Outcome: Progressing Goal: Diagnostic test results will improve 01/28/2023 0008 by Velta Addison, RN Outcome: Progressing 01/28/2023 0007 by Velta Addison, RN Outcome: Progressing Goal: Respiratory complications will improve 01/28/2023 0008 by Velta Addison, RN Outcome: Progressing 01/28/2023 0007 by Velta Addison, RN Outcome: Progressing Goal: Cardiovascular complication will be avoided 01/28/2023 0008 by Velta Addison, RN Outcome: Progressing 01/28/2023 0007 by Velta Addison, RN Outcome: Progressing   Problem: Activity: Goal: Risk for activity intolerance will  decrease 01/28/2023 0008 by Velta Addison, RN Outcome: Progressing 01/28/2023 0007 by Velta Addison, RN Outcome: Progressing   Problem: Nutrition: Goal: Adequate nutrition will be maintained 01/28/2023 0008 by Velta Addison, RN Outcome: Progressing 01/28/2023 0007 by Velta Addison, RN Outcome: Progressing  Problem: Coping: Goal: Level of anxiety will decrease 01/28/2023 0008 by Velta Addison, RN Outcome: Progressing 01/28/2023 0007 by Velta Addison, RN Outcome: Progressing   Problem: Elimination: Goal: Will not experience complications related to bowel motility 01/28/2023 0008 by Velta Addison, RN Outcome: Progressing 01/28/2023 0007 by Velta Addison, RN Outcome: Progressing Goal: Will not experience complications related to urinary retention 01/28/2023 0008 by Velta Addison, RN Outcome: Progressing 01/28/2023 0007 by Velta Addison, RN Outcome: Progressing   Problem: Pain Managment: Goal: General experience of comfort will improve 01/28/2023 0008 by Velta Addison, RN Outcome: Progressing 01/28/2023 0007 by Velta Addison, RN Outcome: Progressing   Problem: Safety: Goal: Ability to remain free from injury will improve 01/28/2023 0008 by Velta Addison, RN Outcome: Progressing 01/28/2023 0007 by Velta Addison, RN Outcome: Progressing   Problem: Skin Integrity: Goal: Risk for impaired skin integrity will decrease 01/28/2023 0008 by Velta Addison, RN Outcome: Progressing 01/28/2023 0007 by Velta Addison, RN Outcome: Progressing

## 2023-01-29 ENCOUNTER — Encounter (HOSPITAL_COMMUNITY)
Admission: EM | Disposition: A | Discharge status: Home / Self Care | Payer: Self-pay | Source: Home / Self Care | Attending: Emergency Medicine

## 2023-01-29 ENCOUNTER — Observation Stay (HOSPITAL_COMMUNITY): Payer: Medicaid Other | Admitting: Anesthesiology

## 2023-01-29 ENCOUNTER — Encounter (HOSPITAL_COMMUNITY): Payer: Self-pay | Admitting: Internal Medicine

## 2023-01-29 DIAGNOSIS — D500 Iron deficiency anemia secondary to blood loss (chronic): Secondary | ICD-10-CM | POA: Diagnosis not present

## 2023-01-29 DIAGNOSIS — I8500 Esophageal varices without bleeding: Secondary | ICD-10-CM

## 2023-01-29 DIAGNOSIS — J90 Pleural effusion, not elsewhere classified: Secondary | ICD-10-CM

## 2023-01-29 DIAGNOSIS — Z87891 Personal history of nicotine dependence: Secondary | ICD-10-CM | POA: Diagnosis not present

## 2023-01-29 HISTORY — PX: ESOPHAGOGASTRODUODENOSCOPY (EGD) WITH PROPOFOL: SHX5813

## 2023-01-29 HISTORY — PX: ESOPHAGEAL BANDING: SHX5518

## 2023-01-29 LAB — GLUCOSE, CAPILLARY
Glucose-Capillary: 157 mg/dL — ABNORMAL HIGH (ref 70–99)
Glucose-Capillary: 232 mg/dL — ABNORMAL HIGH (ref 70–99)

## 2023-01-29 LAB — CBC
HCT: 27.9 % — ABNORMAL LOW (ref 39.0–52.0)
Hemoglobin: 8.3 g/dL — ABNORMAL LOW (ref 13.0–17.0)
MCH: 23.6 pg — ABNORMAL LOW (ref 26.0–34.0)
MCHC: 29.7 g/dL — ABNORMAL LOW (ref 30.0–36.0)
MCV: 79.5 fL — ABNORMAL LOW (ref 80.0–100.0)
Platelets: 107 10*3/uL — ABNORMAL LOW (ref 150–400)
RBC: 3.51 MIL/uL — ABNORMAL LOW (ref 4.22–5.81)
RDW: 14.7 % (ref 11.5–15.5)
WBC: 3.3 10*3/uL — ABNORMAL LOW (ref 4.0–10.5)
nRBC: 0 % (ref 0.0–0.2)

## 2023-01-29 LAB — BASIC METABOLIC PANEL
Anion gap: 10 (ref 5–15)
BUN: 15 mg/dL (ref 6–20)
CO2: 22 mmol/L (ref 22–32)
Calcium: 8.6 mg/dL — ABNORMAL LOW (ref 8.9–10.3)
Chloride: 104 mmol/L (ref 98–111)
Creatinine, Ser: 0.89 mg/dL (ref 0.61–1.24)
GFR, Estimated: >60 mL/min (ref >60)
Glucose, Bld: 216 mg/dL — ABNORMAL HIGH (ref 70–99)
Potassium: 3.9 mmol/L (ref 3.5–5.1)
Sodium: 136 mmol/L (ref 135–145)

## 2023-01-29 SURGERY — ESOPHAGOGASTRODUODENOSCOPY (EGD) WITH PROPOFOL
Anesthesia: Monitor Anesthesia Care

## 2023-01-29 MED ORDER — SODIUM CHLORIDE 0.9 % IV SOLN: INTRAVENOUS | Status: DC

## 2023-01-29 MED ORDER — LACTATED RINGERS IV SOLN
INTRAVENOUS | Status: DC | PRN
Start: 2023-01-29 — End: 2023-01-29

## 2023-01-29 MED ORDER — GLIPIZIDE 10 MG PO TABS: 10.0000 mg | ORAL_TABLET | Freq: Two times a day (BID) | ORAL | Status: DC

## 2023-01-29 MED ORDER — PROPOFOL 500 MG/50ML IV EMUL
INTRAVENOUS | Status: AC
  Filled 2023-01-29: qty 50

## 2023-01-29 MED ORDER — FUROSEMIDE 40 MG PO TABS: 40.0000 mg | ORAL_TABLET | Freq: Every day | ORAL | 0 refills | Status: DC

## 2023-01-29 MED ORDER — IPRATROPIUM-ALBUTEROL 0.5-2.5 (3) MG/3ML IN SOLN
3.0000 mL | Freq: Four times a day (QID) | RESPIRATORY_TRACT | Status: DC | PRN
  Administered 2023-01-29: 3 mL via RESPIRATORY_TRACT
  Filled 2023-01-29: qty 3

## 2023-01-29 MED ORDER — PROPOFOL 500 MG/50ML IV EMUL
INTRAVENOUS | Status: DC | PRN
  Administered 2023-01-29: 125 ug/kg/min via INTRAVENOUS

## 2023-01-29 MED ORDER — PROPOFOL 10 MG/ML IV BOLUS
INTRAVENOUS | Status: DC | PRN
  Administered 2023-01-29 (×3): 20 mg via INTRAVENOUS

## 2023-01-29 MED ORDER — ALUM & MAG HYDROXIDE-SIMETH 200-200-20 MG/5ML PO SUSP
30.0000 mL | ORAL | Status: AC
  Administered 2023-01-29: 30 mL via ORAL
  Filled 2023-01-29: qty 30

## 2023-01-29 MED ORDER — LIDOCAINE 2% (20 MG/ML) 5 ML SYRINGE
INTRAMUSCULAR | Status: DC | PRN
  Administered 2023-01-29: 80 mg via INTRAVENOUS

## 2023-01-29 MED ORDER — LACTATED RINGERS IV SOLN
INTRAVENOUS | Status: AC | PRN
Start: 2023-01-29 — End: 2023-01-29
  Administered 2023-01-29: 1000 mL via INTRAVENOUS

## 2023-01-29 SURGICAL SUPPLY — 25 items

## 2023-01-29 NOTE — Transfer of Care (Signed)
Immediate Anesthesia Transfer of Care Note  Patient: Connor Vaughn  Procedure(s) Performed: ESOPHAGOGASTRODUODENOSCOPY (EGD) WITH PROPOFOL ESOPHAGEAL BANDING  Patient Location: PACU  Anesthesia Type:MAC  Level of Consciousness: awake, alert , and oriented  Airway & Oxygen Therapy: Patient Spontanous Breathing and Patient connected to face mask oxygen  Post-op Assessment: Report given to RN and Post -op Vital signs reviewed and stable  Post vital signs: Reviewed and stable  Last Vitals:  Vitals Value Taken Time  BP    Temp    Pulse 96 01/29/23 0829  Resp 27 01/29/23 0829  SpO2 100 % 01/29/23 0829  Vitals shown include unfiled device data.  Last Pain:  Vitals:   01/29/23 0800  TempSrc: Temporal  PainSc: 0-No pain      Patients Stated Pain Goal: 4 (01/27/23 2125)  Complications: No notable events documented.

## 2023-01-29 NOTE — Op Note (Signed)
[facility] Patient Name: Connor Vaughn Procedure Date: 01/29/2023 MRN: [id number] Attending MD: Kerin Salen , MD, [id number] Date of Birth: 08-Apr-1961 CSN: [id number] Age: 62 Admit Type: Inpatient Procedure:                Upper GI endoscopy Indications:              Iron deficiency anemia secondary to chronic blood                            loss, For therapy of esophageal varices Providers:                Kerin Salen, MD, Adin Hector, RN, Roselie Awkward, RN,                            Brion Aliment, Technician Referring MD:             [facility] Medicines:                Monitored Anesthesia Care Complications:            No immediate complications. Estimated Blood Loss:     Estimated blood loss: none. Procedure:                Pre-Anesthesia Assessment:                           - Prior to the procedure, a History and Physical                            was performed, and patient medications and                            allergies were reviewed. The patient's tolerance of                            previous anesthesia was also reviewed. The risks                            and benefits of the procedure and the sedation                            options and risks were discussed with the patient.                            All questions were answered, and informed consent                            was obtained. Prior Anticoagulants: The patient has                            taken no anticoagulant or antiplatelet agents. ASA                            Grade Assessment: IV - A patient with severe  systemic disease that is a constant threat to life.                            After reviewing the risks and benefits, the patient                            was deemed in satisfactory condition to undergo the                            procedure.                           After obtaining informed consent, the endoscope was                             passed under direct vision. Throughout the                            procedure, the patient's blood pressure, pulse, and                            oxygen saturations were monitored continuously. The                            [id number]-H190 ([id number] Olympus endoscope was introduced                            through the mouth, and advanced to the second part                            of duodenum. The upper GI endoscopy was                            accomplished without difficulty. The patient                            tolerated the procedure well. Scope In: Scope Out: Findings:      Grade III varices were found in the middle third of the esophagus and in       the lower third of the esophagus. Four bands were successfully placed       with complete eradication, resulting in deflation of varices. There was       no bleeding during and at the end of the procedure.      A large amount of food (residue) was found in the cardia, in the gastric       fundus and in the gastric body.      The exam was otherwise without abnormality.      The examined duodenum was normal. Impression:               - Grade III esophageal varices. Completely                            eradicated. Banded.                           -  A large amount of food (residue) in the stomach.                           - The examination was otherwise normal.                           - Normal examined duodenum.                           - No specimens collected. Moderate Sedation:      Patient did not receive moderate sedation for this procedure, but       instead received monitored anesthesia care. Recommendation:           - Advance diet as tolerated.                           - Continue present medications.                           - Repeat upper endoscopy in 6 months for                            retreatment. Procedure Code(s):        --- Professional ---                           [id number],  Esophagogastroduodenoscopy, flexible,                            transoral; with band ligation of esophageal/gastric                            varices Diagnosis Code(s):        --- Professional ---                           I85.00, Esophageal varices without bleeding                           D50.0, Iron deficiency anemia secondary to blood                            loss (chronic) CPT copyright 2022 [employer]. All rights reserved. The codes documented in this report are preliminary and upon coder review may  be revised to meet current compliance requirements. Kerin Salen, MD 01/29/2023 8:23:59 AM This report has been signed electronically. Number of Addenda: 0

## 2023-01-29 NOTE — Anesthesia Preprocedure Evaluation (Addendum)
Anesthesia Evaluation  Patient identified by MRN, date of birth, ID band  Reviewed: Allergy & Precautions, NPO status , Patient's Chart, lab work & pertinent test results  Airway Mallampati: II  TM Distance: >3 FB Neck ROM: Full    Dental no notable dental hx.    Pulmonary sleep apnea , former smoker Home oxygen Pleural effusion   Pulmonary exam normal        Cardiovascular +CHF  Normal cardiovascular exam+ dysrhythmias Supra Ventricular Tachycardia + Cardiac Defibrillator   ECHO: 1. Left ventricular ejection fraction, by estimation, is >75%. The left  ventricle has hyperdynamic function. The left ventricle has no regional  wall motion abnormalities. There is severe left ventricular hypertrophy of  the basal-septal segment. Left  ventricular diastolic parameters are consistent with Grade I diastolic  dysfunction (impaired relaxation).   2. Right ventricular systolic function is normal. The right ventricular  size is normal. Tricuspid regurgitation signal is inadequate for assessing  PA pressure.   3. The mitral valve is myxomatous. No evidence of mitral valve  regurgitation. No evidence of mitral stenosis. There is mild prolapse of  posterior leaflet of the mitral valve.   4. The aortic valve is bicuspid. Aortic valve regurgitation is trivial.  Moderate aortic valve stenosis.   5. 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      Neuro/Psych negative neurological ROS  negative psych ROS   GI/Hepatic negative GI ROS,,,(+) Cirrhosis     substance abuse    Endo/Other  diabetes    Renal/GU Renal disease     Musculoskeletal negative musculoskeletal ROS (+)    Abdominal   Peds  Hematology  (+) Blood dyscrasia, anemia   Anesthesia Other Findings Anemia  Heme positive stool  Reproductive/Obstetrics                              Anesthesia Physical Anesthesia Plan  ASA: 4  Anesthesia Plan: MAC   Post-op Pain Management:    Induction: Intravenous  PONV Risk Score and Plan: 1 and Propofol infusion and Treatment may vary due to age or medical condition  Airway Management Planned: Nasal Cannula  Additional Equipment:   Intra-op Plan:   Post-operative Plan:   Informed Consent: I have reviewed the patients History and Physical, chart, labs and discussed the procedure including the risks, benefits and alternatives for the proposed anesthesia with the patient or authorized representative who has indicated his/her understanding and acceptance.   Patient has DNR.  Suspend DNR.   Dental advisory given  Plan Discussed with: CRNA  Anesthesia Plan Comments:         Anesthesia Quick Evaluation

## 2023-01-29 NOTE — Interval H&P Note (Signed)
History and Physical Interval Note: 62/male with cirrhosis and history of esophageal varices requiring banding, with anemia for an EGD with propofol.  01/29/2023 7:58 AM  Connor Vaughn  has presented today for EGD with possible banding, with the diagnosis of Anemia and heme positive stool.  The various methods of treatment have been discussed with the patient and family. After consideration of risks, benefits and other options for treatment, the patient has consented to  Procedure(s): ESOPHAGOGASTRODUODENOSCOPY (EGD) WITH PROPOFOL (N/A) as a surgical intervention.  The patient's history has been reviewed, patient examined, no change in status, stable for surgery.  I have reviewed the patient's chart and labs.  Questions were answered to the patient's satisfaction.     Kerin Salen

## 2023-01-29 NOTE — Anesthesia Postprocedure Evaluation (Signed)
Anesthesia Post Note  Patient: Connor Vaughn  Procedure(s) Performed: ESOPHAGOGASTRODUODENOSCOPY (EGD) WITH PROPOFOL ESOPHAGEAL BANDING     Patient location during evaluation: Endoscopy Anesthesia Type: MAC Level of consciousness: awake Pain management: pain level controlled Vital Signs Assessment: post-procedure vital signs reviewed and stable Respiratory status: spontaneous breathing, nonlabored ventilation and respiratory function stable Cardiovascular status: blood pressure returned to baseline and stable Postop Assessment: no apparent nausea or vomiting Anesthetic complications: no   No notable events documented.  Last Vitals:  Vitals:   01/29/23 1000 01/29/23 1001  BP:    Pulse:    Resp:    Temp:    SpO2: (!) 89% 99%    Last Pain:  Vitals:   01/29/23 0935  TempSrc:   PainSc: 8                  Marvie Brevik P Keveon Amsler

## 2023-01-29 NOTE — Discharge Summary (Signed)
Physician Discharge Summary  COTEY HAM ZOX:[id number] DOB: 06/24/1960 DOA: 01/24/2023  PCP: Ricky Stabs, NP-C  Admit date: 01/24/2023 Discharge date: 01/29/2023 Recommendations for Outpatient Follow-up:  Follow up with PCP in 1 weeks-call for appointment Please obtain BMP/CBC in one week Follow-up with GI as outpatient  Discharge Dispo: Home Discharge Condition: Stable Code Status:   Code Status: Limited: Do not attempt resuscitation (DNR) -DNR-LIMITED -Do Not Intubate/DNI  Diet recommendation:  Diet Order             Diet regular Room service appropriate? Yes; Fluid consistency: Thin  Diet effective now                    Brief/Interim Summary: 62 yom w/ alcoholic cirrhosis with ascites, recurrent right pleural effusion/hepatic hydrothorax, history of hypertrophic cardiomyopathy, history of SVT status post ICD, type 2 diabetes, CKD stage III, portal hypertension presents to the ER today with worsening shortness of breath.  He had a 4 L thoracentesis at [facility] on September 12, 2022.  He states that his lungs are filling up with fluid again.  He drinks greater than 60 ounces of water a day.  He is on Lasix and Aldactone.  He follows up with cardiology and GI. IN WJ:XBJY 97.9 heart rate 108 blood pressure 147/98 satting 98% on room air. WBC-5.8, hemoglobin 9.5, platelets 163 Sodium 133, potassium 3.5, bicarb 24, BUN of 12, creatinine 0.72, glucose of 264 BNP normal at 40.Total protein 6.6, albumin 3.0, AST 35, ALT 24, alk phos 127, total bili 0.6.Chest x-ray shows large right pleural effusion. [facility] hospitalist consulted.Underwent thoracentesis with removal of 4 L. Patient was given IV albumin monitored overnight Pleural fluid albumin less than 1.5 LDH 32 total nucleated cells 103,Gram stain no organism. GI consulted due to patient request for TIPS eval IR completed CT angio as well as echocardiogram preparation for TI PS although radiologically amenable J wanted  to see if patient completely fails on medical management of the recurrent pleural effusion if so they will arrange for outpatient TIPS procedure. Due to drop in hemoglobin and occult positive GI plan for EGD: EGD showed esophageal varices,placed 4 bands. he had a colonoscopy in 04/2022, so colonoscopy was not repeated. He has a f/u with his GI in [place] in a few weeks.Ok to d/c home today as per GI on furosemide 40 mg and spironolactone 100 mg a day.     Discharge Diagnoses:  Principal Problem:   Recurrent right pleural effusion/hepatic hydrothorax Active Problems:   Alcoholic cirrhosis of liver with ascites (HCC)   CKD stage 3a, GFR 45-59 ml/min (HCC)   DM (diabetes mellitus), type 2 with complications (HCC)   Hypertrophic cardiomyopathy (HCC)   ICD (implantable cardioverter-defibrillator) in place   Portal hypertension (HCC)   DNR (do not resuscitate)/DNI(Do Not Intubate)   Alcoholic cirrhosis of liver with ascites Recurrent right pleural effusion/hepatic hydrothorax Underwent thoracentesis with removal of 4 L.Patient was given IV albumin.  Currently doing well on room air.  Patient reports he has had at least 4 thoracentesis this year and almost had  about 13 since his diagnosis. Pleural fluid albumin less than 1.5 LDH 32 total nucleated cells 103,Gram stain no organism.GI consulted due to patient request for TIPS eval IR completed CT angio as well as echocardiogram preparation for TI PS although radiologically amenable but GI wanted to see if patient completely fails on medical management of the recurrent pleural effusion if so they will arrange for outpatient TIPS  procedure.S/p ultrasound thoracentesis 01/27/23: 2.2 l removed. Respiratory status stable, cont lasix, Coreg, aldactone, low salt and fluid restricted diet   Acute on chronic anemia Melanotic stool History of esophageal varices banding and colon polyp: Hemoglobin at baseline around 9.5 g downtrended-no current recent  hemoglobin available, FOBT is positive>EGD showed esophageal varices,placed 4 bands. he had a colonoscopy in 04/2022, so colonoscopy was not repeated. He has a f/u with his GI in [place] in a few weeks.Ok to d/c home today as per GI on furosemide 40 mg and spironolactone 100 mg a day. Recent Labs    01/25/23 0512 01/25/23 0746 01/25/23 1906 01/26/23 0452 01/27/23 0505 01/28/23 0501 01/29/23 0456  HGB 7.4*   < > 7.8* 8.6* 8.0* 7.9* 8.3*  MCV 79.8*  --   --  80.5 79.1* 79.6* 79.5*  VITAMINB12  --   --  590  --   --   --   --   FOLATE  --   --  21.7  --   --   --   --   FERRITIN  --   --  6*  --   --   --   --   TIBC  --   --  367  --   --   --   --   IRON  --   --  13*  --   --   --   --   RETICCTPCT 3.0  --   --   --   --   --   --    < > = values in this interval not displayed.    Portal hypertension Esophageal varices; Stable, continue coreg Aldactone, 4 bands were applied today  Hypertrophic cardiomyopathy   ICD in place: Stable/no chest pain heart rate stable, her cardiac MRI from 03-2020 documents HCOM.  Echo shows EF more than 75%, G1 DD severe LVH basal septal segment aortic valve bicuspid mitral valve myxomatous   T2DM:  Patient will resume his home regimen upon discharge follow-up with PCP to adjust  and begin ACHS monitoring of blood sugar at home  Recent Labs  Lab 01/25/23 0512 01/25/23 0729 01/28/23 0753 01/28/23 1229 01/28/23 1651 01/28/23 2059 01/29/23 0805  GLUCAP  --    < > 164* 263* 185* 166* 157*  HGBA1C 10.0*  --   --   --   --   --   --    < > = values in this interval not displayed.    CKD likely stage 2: his creatinine is at 0.8  Consults: GI and IR Subjective: Alert awake oriented per procedure seen earlier had some reflux and discomfort MiraLAX was given, He feels well enough to go home today  Discharge Exam: Vitals:   01/29/23 1000 01/29/23 1001  BP:    Pulse:    Resp:    Temp:    SpO2: (!) 89% 99%   General: Pt is alert, awake,  not in acute distress Cardiovascular: RRR, S1/S2 +, no rubs, no gallops Respiratory: CTA bilaterally, no wheezing, no rhonchi Abdominal: Soft, NT, ND, bowel sounds + Extremities: no edema, no cyanosis  Discharge Instructions  Discharge Instructions     Discharge instructions   Complete by: As directed    Please call call MD or return to ER for similar or worsening recurring problem that brought you to hospital or if any fever,nausea/vomiting,abdominal pain, uncontrolled pain, chest pain,  shortness of breath or any other alarming symptoms.  Please follow-up your  doctor as instructed in a week time and call the office for appointment.  Please avoid alcohol, smoking, or any other illicit substance and maintain healthy habits including taking your regular medications as prescribed.  You were cared for by a hospitalist during your hospital stay. If you have any questions about your discharge medications or the care you received while you were in the hospital after you are discharged, you can call the unit and ask to speak with the hospitalist on call if the hospitalist that took care of you is not available.  Once you are discharged, your primary care physician will handle any further medical issues. Please note that NO REFILLS for any discharge medications will be authorized once you are discharged, as it is imperative that you return to your primary care physician (or establish a relationship with a primary care physician if you do not have one) for your aftercare needs so that they can reassess your need for medications and monitor your lab values   Increase activity slowly   Complete by: As directed       Allergies as of 01/29/2023       Reactions   Latex Rash        Medication List     TAKE these medications    albuterol 108 (90 Base) MCG/ACT inhaler Commonly known as: VENTOLIN HFA Inhale into the lungs every 6 (six) hours as needed for wheezing or shortness of breath.    benzonatate 100 MG capsule Commonly known as: TESSALON Take 100 mg by mouth 3 (three) times daily as needed for cough.   busPIRone 5 MG tablet Commonly known as: BUSPAR Take 5 mg by mouth 2 (two) times daily.   carboxymethylcellulose 0.5 % Soln Commonly known as: REFRESH PLUS Place 1 drop into both eyes 2 (two) times daily as needed.   carvedilol 12.5 MG tablet Commonly known as: COREG Take 1.5 tablets (18.75 mg total) by mouth 2 (two) times daily. What changed: how much to take   dapagliflozin propanediol 10 MG Tabs tablet Commonly known as: FARXIGA Take 10 mg by mouth daily.   DULoxetine 30 MG capsule Commonly known as: CYMBALTA Take 30 mg by mouth every evening.   DULoxetine 60 MG capsule Commonly known as: CYMBALTA Take 60 mg by mouth in the morning.   ferrous sulfate 325 (65 FE) MG EC tablet Take 325 mg by mouth daily with breakfast.   fluticasone 50 MCG/ACT nasal spray Commonly known as: FLONASE Place 1 spray into both nostrils daily as needed for allergies or rhinitis.   furosemide 40 MG tablet Commonly known as: LASIX Take 1 tablet (40 mg total) by mouth daily. Two tabs twice daily What changed:  medication strength how much to take Another medication with the same name was removed. Continue taking this medication, and follow the directions you see here.   glipiZIDE 10 MG tablet Commonly known as: GLUCOTROL Take 1 tablet (10 mg total) by mouth 2 (two) times daily.   guaiFENesin-codeine 100-10 MG/5ML syrup Commonly known as: ROBITUSSIN AC Take 5 mLs by mouth 3 (three) times daily as needed for cough.   hydrOXYzine 25 MG capsule Commonly known as: VISTARIL Take 25 mg by mouth 3 (three) times daily as needed for anxiety.   ipratropium-albuterol 0.5-2.5 (3) MG/3ML Soln Commonly known as: DUONEB Take 3 mLs by nebulization every 6 (six) hours as needed.   lidocaine 4 % Place 1 patch onto the skin 2 (two) times daily as needed.   montelukast  10 MG  tablet Commonly known as: SINGULAIR Take 10 mg by mouth daily.   ondansetron 4 MG tablet Commonly known as: ZOFRAN Take 4 mg by mouth every 6 (six) hours as needed for nausea or vomiting.   pantoprazole 40 MG tablet Commonly known as: PROTONIX Take 40 mg by mouth daily.   sitaGLIPtin 50 MG tablet Commonly known as: JANUVIA Take 50 mg by mouth daily.   spironolactone 100 MG tablet Commonly known as: ALDACTONE Take 200 mg by mouth 2 (two) times daily.        Follow-up Information     Ricky Stabs, NP-C Follow up in 1 week(s).   Specialty: Adult Health Nurse Practitioner Contact information: [address] [place] [address] [phone]                Allergies  Allergen Reactions   Latex Rash    The results of significant diagnostics from this hospitalization (including imaging, microbiology, ancillary and laboratory) are listed below for reference.    Microbiology: Recent Results (from the past 240 hour(s))  Body fluid culture w Gram Stain     Status: None   Collection Time: 01/24/23 10:10 PM   Specimen: Pleural Fluid  Result Value Ref Range Status   Specimen Description   Final    PLEURAL Performed at [facility], [address], [place], [place] [address]    Special Requests   Final    NONE Performed at [facility], [address] [place], [place] [address]    Gram Stain   Final    RARE WBC PRESENT, PREDOMINANTLY MONONUCLEAR NO ORGANISMS SEEN    Culture   Final    NO GROWTH 3 DAYS Performed at [facility], [address], [place], [place] [address]    Report Status 01/28/2023 FINAL  Final    Procedures/Studies: US THORACENTESIS ASP PLEURAL SPACE W/IMG GUIDE  Result Date: 01/27/2023 INDICATION: 62 year old male inpatient. History of hepatic hydrothorax. Is requesting a therapeutic right-sided thoracentesis EXAM: ULTRASOUND GUIDED THERAPEUTIC RIGHT-SIDED THORACENTESIS  MEDICATIONS: Lidocaine 1% 10 mL COMPLICATIONS: None immediate. PROCEDURE: An ultrasound guided thoracentesis was thoroughly discussed with the patient and questions answered. The benefits, risks, alternatives and complications were also discussed. The patient understands and wishes to proceed with the procedure. Written consent was obtained. Ultrasound was performed to localize and mark an adequate pocket of fluid in the right chest. The area was then prepped and draped in the normal sterile fashion. 1% Lidocaine was used for local anesthesia. Under ultrasound guidance a 19 gauge, 7-cm, Yueh catheter was introduced. Thoracentesis was performed. The catheter was removed and a dressing applied. FINDINGS: A total of approximately 2.2 L of straw-colored fluid was removed. IMPRESSION: Successful ultrasound guided right-sided therapeutic thoracentesis yielding 2.2 L of pleural fluid. Read by: Anders Grant, NP Electronically Signed   By: Gilmer Mor D.O.   On: 01/27/2023 15:35   DG CHEST PORT 1 VIEW  Result Date: 01/27/2023 CLINICAL DATA:  Status post right-sided thoracentesis. EXAM: PORTABLE CHEST 1 VIEW COMPARISON:  One-view chest x-ray 01/24/2023 FINDINGS: No right-sided pneumothorax is present. A small amount of residual fluid or basilar atelectasis is present. The lung volumes are low. AICD wire is stable. Heart size is normal. IMPRESSION: 1. No right-sided pneumothorax following thoracentesis. 2. Small amount of residual fluid or basilar atelectasis. Electronically Signed   By: Marin Roberts M.D.   On: 01/27/2023 12:58   CT Angio Abd/Pel w/ and/or  w/o  Addendum Date: 01/26/2023   ADDENDUM REPORT: 01/26/2023 17:39 ADDENDUM: Main portal vein measures approximately 1.3 cm. There is a short right portal vein. The 3 main intrahepatic portal veins are the left portal vein, anterior branch of the right portal vein and posterior branch of the right portal vein. Electronically Signed   By: Richarda Overlie M.D.    On: 01/26/2023 17:39   Result Date: 01/26/2023 CLINICAL DATA:  Recurrent right pleural effusion. Hepatic hydrothorax. Esophageal varices. EXAM: CTA ABDOMEN AND PELVIS WITHOUT AND WITH CONTRAST TECHNIQUE: Multidetector CT imaging of the abdomen and pelvis was performed using the standard protocol during bolus administration of intravenous contrast. Multiplanar reconstructed images and MIPs were obtained and reviewed to evaluate the vascular anatomy. RADIATION DOSE REDUCTION: This exam was performed according to the departmental dose-optimization program which includes automated exposure control, adjustment of the mA and/or kV according to patient size and/or use of iterative reconstruction technique. CONTRAST:  OMNIPAQUE IOHEXOL 350 MG/ML SOLN COMPARISON:  Chest CT abdomen and pelvis 12/15/2022 FINDINGS: VASCULAR Aorta: Atherosclerotic disease abdominal aorta without aneurysm, dissection or significant stenosis. Celiac: Patent without evidence of aneurysm, dissection, vasculitis or significant stenosis. SMA: Patent without evidence of aneurysm, dissection, vasculitis or significant stenosis. Renals: Both renal arteries are patent without evidence of aneurysm, dissection, vasculitis, fibromuscular dysplasia or significant stenosis. IMA: Patent Inflow: Atherosclerotic calcifications involving bilateral iliac arteries without aneurysm dissection or significant stenosis. Proximal Outflow: Proximal femoral arteries are patent bilaterally. Veins: Again noted are enlarged distal esophageal varices. Largest esophageal varices measures 1.5 cm in transverse dimension. The esophageal varices appear to be associated with the left gastric vein. Intrahepatic portal veins are patent. There are 3 main intrahepatic portal veins. Superior mesenteric vein and splenic vein are patent. Bilateral renal veins are patent. No significant gastro renal shunt. No significant gastric varices identified. Review of the MIP images confirms  the above findings. NON-VASCULAR Lower chest: Again noted is a large right pleural effusion with compressive atelectasis in the right lower lobe. Left lung base is clear. ICD lead extends into the right ventricle. Calcifications at the aortic valve. Hepatobiliary: Liver is diffusely nodular and compatible with cirrhosis. No suspicious hepatic lesion identified. Normal appearance of the gallbladder. Pancreas: Unremarkable. No pancreatic ductal dilatation or surrounding inflammatory changes. Spleen: Again noted is splenic enlargement. Heterogeneous enhancement in the spleen. Focal irregularity along the anterior aspect of the spleen image 24/10 is similar to the previous examination and could be associated with a previous infarct. There is a new low-density structure along the anterior aspect of the spleen on image 27/10 that measures 2.4 cm. This was not clearly present on the recent comparison examination. This may also be associated with a recent splenic infarct. Adrenals/Urinary Tract: Normal adrenal glands. Negative kidney stones. No hydronephrosis. No suspicious renal lesions. Normal appearance of the urinary bladder. Stomach/Bowel: No evidence for bowel dilatation or obstruction. High-density material in the gastric cardia region. Mesenteric edema throughout the abdomen and surrounding bowel structures. No evidence for focal bowel inflammation. Lymphatic: Small periaortic lymph nodes. No significant lymph node enlargement in the abdomen or pelvis. Reproductive: Prostate is unremarkable. Other: Again noted is large amount of fluid extending of the right inguinal canal and right side of the scrotum. This fluid has slightly increased since 12/15/2022. Small amount of fluid in the pelvis and abdomen. Abdominal fluid is predominantly around the liver. Diffuse mesenteric edema in the abdomen and pelvis is similar to the prior CT. Musculoskeletal: Bilateral laminectomy defects at L4 and  L5. No acute bone abnormality.  IMPRESSION: VASCULAR 1. Large esophageal varices associated with the left gastric vein. 2. Portal venous system is patent. Three main intrahepatic portal veins. 3. Atherosclerotic calcifications involving the abdominal aorta without aneurysm. NON-VASCULAR 1. Cirrhosis with splenomegaly, esophageal varices, right pleural effusion and small amount of ascites. Findings are compatible with portal hypertension. 2. Large right pleural effusion with compressive atelectasis. 3. Heterogeneous appearance of the enlarged spleen. New focal low density in the anterior aspect of the spleen may be cystic and measures roughly 2.4 cm. This could be associated with a recent infarct. 4. Diffuse mesenteric edema. 5. Right inguinal hernia with a large amount of fluid extending into the right scrotum. Electronically Signed: By: Richarda Overlie M.D. On: 01/26/2023 16:08   ECHOCARDIOGRAM COMPLETE  Result Date: 01/26/2023    ECHOCARDIOGRAM REPORT   Patient Name:   Connor Vaughn Date of Exam: 01/26/2023 Medical Rec #:  [id number]          Height:       66.0 in Accession #:    [id number]         Weight:       159.4 lb Date of Birth:  11/05/1960          BSA:          1.816 m Patient Age:    62 years           BP:           90/63 mmHg Patient Gender: M                  HR:           88 bpm. Exam Location:  Inpatient Procedure: 2D Echo, 3D Echo, Cardiac Doppler and Color Doppler Indications:    I50.40* Unspecified combined systolic (congestive) and diastolic                 (congestive) heart failure  History:        Patient has prior history of Echocardiogram examinations, most                 recent 04/07/2020. Hypertrophic Cardiomyopathy, Abnormal ECG and                 Defibrillator, Aortic Valve Disease, Arrythmias:NSVT; Risk                 Factors:Diabetes. ETOH.  Sonographer:    Sheralyn Boatman RDCS Referring Phys: [id number] [facility]  Sonographer Comments: Technically difficult study due to poor echo windows. Image acquisition challenging due  to respiratory motion. Apical window extremely lateral near armpit. IMPRESSIONS  1. Left ventricular ejection fraction, by estimation, is >75%. The left ventricle has hyperdynamic function. The left ventricle has no regional wall motion abnormalities. There is severe left ventricular hypertrophy of the basal-septal segment. Left ventricular diastolic parameters are consistent with Grade I diastolic dysfunction (impaired relaxation).  2. Right ventricular systolic function is normal. The right ventricular size is normal. Tricuspid regurgitation signal is inadequate for assessing PA pressure.  3. The mitral valve is myxomatous. No evidence of mitral valve regurgitation. No evidence of mitral stenosis. There is mild prolapse of posterior leaflet of the mitral valve.  4. The aortic valve is bicuspid. Aortic valve regurgitation is trivial. Moderate aortic valve stenosis.  5. 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 There is mild dilatation of the aortic root, measuring 41 mm. FINDINGS  Left Ventricle: Left ventricular ejection fraction,  by estimation, is >75%. The left ventricle has hyperdynamic function. The left ventricle has no regional wall motion abnormalities. The left ventricular internal cavity size was small. There is severe left ventricular hypertrophy of the basal-septal segment. Left ventricular diastolic parameters are consistent with Grade I diastolic dysfunction (impaired relaxation). Right Ventricle: The right ventricular size is normal. Right ventricular systolic function is normal. Tricuspid regurgitation signal is inadequate for assessing PA pressure. The tricuspid regurgitant velocity is 1.76 m/s, and with an assumed right atrial  pressure of 3 mmHg, the estimated right ventricular systolic pressure is 15.4 mmHg. Left Atrium: Left atrial size was normal in size. Right Atrium: Right atrial size was normal in size. Pericardium: There is no evidence of pericardial  effusion. Mitral Valve: The mitral valve is myxomatous. There is mild prolapse of posterior leaflet of the mitral valve. No evidence of mitral valve regurgitation. No evidence of mitral valve stenosis. Tricuspid Valve: The tricuspid valve is grossly normal. Tricuspid valve regurgitation is trivial. No evidence of tricuspid stenosis. Aortic Valve: The aortic valve is bicuspid. Aortic valve regurgitation is trivial. Moderate aortic stenosis is present. Aortic valve mean gradient measures 20.0 mmHg. Aortic valve peak gradient measures 36.1 mmHg. Aortic valve area, by VTI measures 1.43 cm. Pulmonic Valve: The pulmonic valve was normal in structure. Pulmonic valve regurgitation is not visualized. No evidence of pulmonic stenosis. 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 There is mild dilatation of the aortic root, measuring 41 mm. IAS/Shunts: No atrial level shunt detected by color flow Doppler. Additional Comments: A device lead is visualized.  LEFT VENTRICLE PLAX 2D LVIDd:         4.60 cm     Diastology LVIDs:         2.90 cm     LV e' medial:    7.72 cm/s LV PW:         1.10 cm     LV E/e' medial:  11.7 LV IVS:        1.00 cm     LV e' lateral:   7.18 cm/s LVOT diam:     2.30 cm     LV E/e' lateral: 12.6 LV SV:         84 LV SV Index:   46 LVOT Area:     4.15 cm  LV Volumes (MOD) LV vol d, MOD A2C: 65.1 ml LV vol d, MOD A4C: 69.0 ml LV vol s, MOD A2C: 26.9 ml LV vol s, MOD A4C: 25.7 ml LV SV MOD A2C:     38.2 ml LV SV MOD A4C:     69.0 ml LV SV MOD BP:      46.0 ml RIGHT VENTRICLE             IVC RV S prime:     12.60 cm/s  IVC diam: 0.90 cm TAPSE (M-mode): 2.6 cm LEFT ATRIUM             Index        RIGHT ATRIUM           Index LA diam:        2.80 cm 1.54 cm/m   RA Area:     10.20 cm LA Vol (A2C):   28.7 ml 15.80 ml/m  RA Volume:   20.70 ml  11.40 ml/m LA Vol (A4C):   24.2 ml 13.33 ml/m LA Biplane Vol: 26.7 ml 14.70 ml/m  AORTIC VALVE AV Area (Vmax):  1.48 cm AV Area (Vmean):   1.45 cm AV Area (VTI):     1.43 cm AV Vmax:           300.40 cm/s AV Vmean:          207.200 cm/s AV VTI:            0.591 m AV Peak Grad:      36.1 mmHg AV Mean Grad:      20.0 mmHg LVOT Vmax:         107.00 cm/s LVOT Vmean:        72.400 cm/s LVOT VTI:          0.203 m LVOT/AV VTI ratio: 0.34  AORTA Ao Root diam: 4.10 cm Ao Asc diam:  3.55 cm MITRAL VALVE                TRICUSPID VALVE MV Area (PHT): 4.35 cm     TR Peak grad:   12.4 mmHg MV Decel Time: 175 msec     TR Vmax:        176.00 cm/s MV E velocity: 90.30 cm/s MV A velocity: 101.70 cm/s  SHUNTS MV E/A ratio:  0.89         Systemic VTI:  0.20 m                             Systemic Diam: 2.30 cm Olga Millers MD Electronically signed by Olga Millers MD Signature Date/Time: 01/26/2023/12:57:57 PM    Final    DG CHEST PORT 1 VIEW  Result Date: 01/24/2023 CLINICAL DATA:  Post thoracentesis EXAM: PORTABLE CHEST 1 VIEW COMPARISON:  01/24/2023 FINDINGS: Cardiac pacemaker. Heart size and pulmonary vascularity are normal. Near complete evacuation of right pleural effusion post thoracentesis. No pneumothorax. Residual atelectasis or infiltration seen in the right lung base. Left lung is clear. IMPRESSION: Near complete evacuation of right pleural effusion post thoracentesis with residual atelectasis in the lung base. No pneumothorax. Electronically Signed   By: Burman Nieves M.D.   On: 01/24/2023 22:59   DG Chest Port 1 View  Result Date: 01/24/2023 CLINICAL DATA:  Increasing shortness of breath for 2 weeks. Peripheral edema. History of congestive heart failure. EXAM: PORTABLE CHEST 1 VIEW COMPARISON:  01/12/2023 FINDINGS: Cardiac pacemaker. Heart size is normal for technique. Interval development of a large right pleural effusion. Likely compressive atelectasis or consolidation in the right lung. No pneumothorax. Left lung is clear. IMPRESSION: Interval development of a large right pleural effusion since prior study.  Electronically Signed   By: Burman Nieves M.D.   On: 01/24/2023 21:00    Labs: BNP (last 3 results) Recent Labs    11/02/22 1157 01/24/23 1917  BNP 27.3 40.0   Basic Metabolic Panel: Recent Labs  Lab 01/25/23 0512 01/26/23 0452 01/27/23 0505 01/28/23 0501 01/29/23 0456  NA 131* 134* 135 134* 136  K 3.5 3.3* 3.2* 3.9 3.9  CL 96* 96* 98 102 104  CO2 27 29 27 26 22   GLUCOSE 330* 254* 179* 98 216*  BUN 13 13 16 12 15   CREATININE 0.80 0.87 0.99 0.64 0.89  CALCIUM 8.1* 8.3* 8.3* 8.2* 8.6*  MG 1.7  --   --   --   --    Liver Function Tests: Recent Labs  Lab 01/24/23 1917 01/25/23 0512  AST 35 25  ALT 24 18  ALKPHOS 127* 94  BILITOT 0.6 0.9  PROT 6.6 5.8*  ALBUMIN 3.0* 3.0*   No results for input(s): "LIPASE", "AMYLASE" in the last 168 hours. No results for input(s): "AMMONIA" in the last 168 hours. CBC: Recent Labs  Lab 01/24/23 1917 01/25/23 0512 01/25/23 0746 01/25/23 1906 01/26/23 0452 01/27/23 0505 01/28/23 0501 01/29/23 0456  WBC 5.8 3.5*  --   --  2.4* 2.8* 2.5* 3.3*  NEUTROABS 3.6 2.2  --   --   --   --   --   --   HGB 9.5* 7.4*   < > 7.8* 8.6* 8.0* 7.9* 8.3*  HCT 31.9* 24.9*   < > 25.9* 28.9* 26.1* 27.0* 27.9*  MCV 79.8* 79.8*  --   --  80.5 79.1* 79.6* 79.5*  PLT 163 109*  --   --  102* 111* 97* 107*   < > = values in this interval not displayed.  CBG: Recent Labs  Lab 01/28/23 0753 01/28/23 1229 01/28/23 1651 01/28/23 2059 01/29/23 0805  GLUCAP 164* 263* 185* 166* 157*      Component Value Date/Time   COLORURINE YELLOW 02/20/2021 1512   APPEARANCEUR CLEAR 02/20/2021 1512   LABSPEC 1.038 (H) 02/20/2021 1512   PHURINE 5.0 02/20/2021 1512   GLUCOSEU >=500 (A) 02/20/2021 1512   HGBUR NEGATIVE 02/20/2021 1512   BILIRUBINUR NEGATIVE 02/20/2021 1512   KETONESUR 5 (A) 02/20/2021 1512   PROTEINUR 30 (A) 02/20/2021 1512   NITRITE NEGATIVE 02/20/2021 1512   LEUKOCYTESUR NEGATIVE 02/20/2021 1512   Sepsis Labs Recent Labs  Lab  01/26/23 0452 01/27/23 0505 01/28/23 0501 01/29/23 0456  WBC 2.4* 2.8* 2.5* 3.3*   Microbiology Recent Results (from the past 240 hour(s))  Body fluid culture w Gram Stain     Status: None   Collection Time: 01/24/23 10:10 PM   Specimen: Pleural Fluid  Result Value Ref Range Status   Specimen Description   Final    PLEURAL Performed at [facility], [address], [place], [place] [address]    Special Requests   Final    NONE Performed at [facility], [address] [place], [place] [address]    Gram Stain   Final    RARE WBC PRESENT, PREDOMINANTLY MONONUCLEAR NO ORGANISMS SEEN    Culture   Final    NO GROWTH 3 DAYS Performed at [facility], [address] [place], [place] [address]    Report Status 01/28/2023 FINAL  Final    Time coordinating discharge: 25 minutes  SIGNED: Lanae Boast, MD  [facility] 01/29/2023, 11:24 AM  If 7PM-7AM, please contact night-coverage [email or web address]

## 2023-02-02 ENCOUNTER — Encounter (HOSPITAL_COMMUNITY): Payer: Self-pay | Admitting: Gastroenterology

## 2023-02-02 ENCOUNTER — Other Ambulatory Visit: Payer: Self-pay

## 2023-02-02 DIAGNOSIS — K7031 Alcoholic cirrhosis of liver with ascites: Secondary | ICD-10-CM

## 2023-02-20 ENCOUNTER — Ambulatory Visit (INDEPENDENT_AMBULATORY_CARE_PROVIDER_SITE_OTHER): Payer: Medicaid Other

## 2023-02-20 DIAGNOSIS — I4729 Other ventricular tachycardia: Secondary | ICD-10-CM | POA: Diagnosis not present

## 2023-02-21 LAB — CUP PACEART REMOTE DEVICE CHECK
Date Time Interrogation Session: 20240930162903
Implantable Lead Connection Status: 753985
Implantable Lead Implant Date: 20211227
Implantable Lead Location: 753860
Implantable Lead Model: 436909
Implantable Lead Serial Number: 81421424
Implantable Pulse Generator Implant Date: 20211227
Pulse Gen Model: 429522
Pulse Gen Serial Number: 84812005

## 2023-03-07 NOTE — Progress Notes (Signed)
Remote ICD transmission.   

## 2023-03-20 NOTE — Progress Notes (Unsigned)
Electrophysiology Office Note:   ID:  Connor, Vaughn 1961/05/19, MRN [id number]  Primary Cardiologist: Little Ishikawa, MD Electrophysiologist: Lewayne Bunting, MD  {Click to update primary MD,subspecialty MD or APP then REFRESH:1}    History of Present Illness:   Connor Vaughn is a 62 y.o. male with h/o HCM and syncope s/p ICD, NSVT, alcoholic cirrhosis with ascites, DM2, CKD III, and portal hypertension seen today for post hospital follow up.    Admitted 9/3 - 9/8 for recurrent R pleural effusion and hepatic hydrothorax. Underwent thoracentesis.   Since discharge from hospital the patient reports doing ***.  he denies chest pain, palpitations, dyspnea, PND, orthopnea, nausea, vomiting, dizziness, syncope, edema, weight gain, or early satiety.      Review of systems complete and found to be negative unless listed in HPI.   EP Information / Studies Reviewed:    EKG is not ordered today. EKG from 01/24/2023 reviewed which showed sinus tachycardia at 110 bpm       PPM Interrogation-  reviewed in detail today,  See PACEART report.  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 and h/o syncope   Physical Exam:   VS:  There were no vitals taken for this visit.   Wt Readings from Last 3 Encounters:  01/29/23 165 lb 9.1 oz (75.1 kg)  11/02/22 167 lb 12.8 oz (76.1 kg)  03/22/22 187 lb 3.2 oz (84.9 kg)     GEN: Well nourished, well developed in no acute distress NECK: No JVD; No carotid bruits CARDIAC: {EPRHYTHM:28826}, no murmurs, rubs, gallops RESPIRATORY:  Clear to auscultation without rales, wheezing or rhonchi  ABDOMEN: Soft, non-tender, non-distended EXTREMITIES:  No edema; No deformity   ASSESSMENT AND PLAN:    CHM  s/p Biotronik single chamber ICD  H/o Syncope euvolemic today Stable on an appropriate medical regimen Normal ICD function See [facility] Art report No changes today  Cirrhosis Portal HTN H/o Ascites DM2 Per PCP /  GI  Disposition:   Follow up with {EPPROVIDERS:28135} {EPFOLLOW UP:28173}   Signed, Graciella Freer, PA-C

## 2023-03-21 ENCOUNTER — Ambulatory Visit: Payer: Medicaid Other | Attending: Student | Admitting: Student

## 2023-03-21 ENCOUNTER — Encounter: Payer: Self-pay | Admitting: Student

## 2023-03-21 VITALS — BP 112/74 | HR 93 | Ht 66.0 in | Wt 155.8 lb

## 2023-03-21 DIAGNOSIS — I4729 Other ventricular tachycardia: Secondary | ICD-10-CM

## 2023-03-21 DIAGNOSIS — R55 Syncope and collapse: Secondary | ICD-10-CM | POA: Diagnosis not present

## 2023-03-21 DIAGNOSIS — N183 Chronic kidney disease, stage 3 unspecified: Secondary | ICD-10-CM

## 2023-03-21 DIAGNOSIS — I422 Other hypertrophic cardiomyopathy: Secondary | ICD-10-CM

## 2023-03-21 DIAGNOSIS — Z95810 Presence of automatic (implantable) cardiac defibrillator: Secondary | ICD-10-CM

## 2023-03-21 DIAGNOSIS — K7031 Alcoholic cirrhosis of liver with ascites: Secondary | ICD-10-CM

## 2023-03-21 LAB — CUP PACEART INCLINIC DEVICE CHECK
Date Time Interrogation Session: 20241029121032
Implantable Lead Connection Status: 753985
Implantable Lead Implant Date: 20211227
Implantable Lead Location: 753860
Implantable Lead Model: 436909
Implantable Lead Serial Number: 81421424
Implantable Pulse Generator Implant Date: 20211227
Pulse Gen Model: 429522
Pulse Gen Serial Number: 84812005

## 2023-03-21 NOTE — Patient Instructions (Signed)
Medication Instructions:  Your physician recommends that you continue on your current medications as directed. Please refer to the Current Medication list given to you today.  *If you need a refill on your cardiac medications before your next appointment, please call your pharmacy*  Lab Work: None ordered If you have labs (blood work) drawn today and your tests are completely normal, you will receive your results only by: MyChart Message (if you have MyChart) OR A paper copy in the mail If you have any lab test that is abnormal or we need to change your treatment, we will call you to review the results.  Follow-Up: At [facility], you and your health needs are our priority.  As part of our continuing mission to provide you with exceptional heart care, we have created designated Provider Care Teams.  These Care Teams include your primary Cardiologist (physician) and Advanced Practice Providers (APPs -  Physician Assistants and Nurse Practitioners) who all work together to provide you with the care you need, when you need it.  We recommend signing up for the patient portal called "MyChart".  Sign up information is provided on this After Visit Summary.  MyChart is used to connect with patients for Virtual Visits (Telemedicine).  Patients are able to view lab/test results, encounter notes, upcoming appointments, etc.  Non-urgent messages can be sent to your provider as well.   To learn more about what you can do with MyChart, go to [email or web address].    Your next appointment:   1 year(s)  Provider:   Lewayne Bunting, MD

## 2023-03-31 ENCOUNTER — Ambulatory Visit
Admission: RE | Admit: 2023-03-31 | Discharge: 2023-03-31 | Disposition: A | Discharge status: Home / Self Care | Payer: Medicaid Other | Source: Ambulatory Visit

## 2023-03-31 DIAGNOSIS — K7031 Alcoholic cirrhosis of liver with ascites: Secondary | ICD-10-CM

## 2023-03-31 HISTORY — PX: IR RADIOLOGIST EVAL & MGMT: IMG5224

## 2023-03-31 NOTE — Progress Notes (Signed)
Reason for visit: Portal hypertension with hydrothorax   Care Team: PCP: Ricky Stabs, NP-C at [facility] Cardiologist:  Little Ishikawa, MD Gastroenterology: Magda Bernheim, MD and [facility]  Virtual Visit via Telephone Note   I connected with Connor Vaughn on 03/31/23 by telephone and verified that I am speaking with the correct person using two identifiers. I discussed the limitations, risks, security and privacy concerns of performing an evaluation and management service by telephone and the availability of in-person appointments.  History of Present Illness:  Connor Vaughn is a 60 y.o. 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 Connor Vaughn is known to VIR s/p serial R thoracenteses, most recently 03/20/23 w 3.5 L drawn.  Connor Vaughn is closely followed by [facility] and started on diuretics (spironolactone 100mg  + furosemide 40mg  BID). He denies AMS and has not had additional variceal bleeding since 12/2019. Limited US during most recent thoracentesis, revealed a small volume of ascites however Connor Vaughn denies paracentesis or abdominal distention / discomfort.  Review of Systems: A 12-point ROS discussed, and pertinent positives are indicated in the HPI above.  All other systems are negative.    Past Medical History:  Diagnosis Date   Alcohol abuse 12/24/2015   Cirrhosis (HCC)    Diabetes mellitus, type II (HCC)    DKA (diabetic ketoacidoses) 04/04/2019   H/O fracture    nasal x 2   Sleep apnea    UGIB (upper gastrointestinal bleed) 12/2019    Past Surgical History:  Procedure Laterality Date   ESOPHAGEAL BANDING  01/14/2020   Procedure: ESOPHAGEAL VARICES BANDING;  Surgeon: Jeani Hawking, MD;  Location: [facility]OSCOPY;  Service: Endoscopy;;   ESOPHAGEAL BANDING  01/29/2023   Procedure:  ESOPHAGEAL BANDING;  Surgeon: Kerin Salen, MD;  Location: [facility]OS[facility];  Service: Gastroenterology;;   ESOPHAGOGASTRODUODENOSCOPY N/A 01/13/2020   Procedure: ESOPHAGOGASTRODUODENOSCOPY (EGD);  Surgeon: Charna Elizabeth, MD;  Location: [facility];  Service: Endoscopy;  Laterality: N/A;   ESOPHAGOGASTRODUODENOSCOPY (EGD) WITH PROPOFOL N/A 04/05/2019   Procedure: ESOPHAGOGASTRODUODENOSCOPY (EGD) WITH PROPOFOL;  Surgeon: Jeani Hawking, MD;  Location: [facility];  Service: Endoscopy;  Laterality: N/A;   ESOPHAGOGASTRODUODENOSCOPY (EGD) WITH PROPOFOL N/A 01/14/2020   Procedure: ESOPHAGOGASTRODUODENOSCOPY (EGD) WITH PROPOFOL;  Surgeon: Jeani Hawking, MD;  Location: [facility];  Service: Endoscopy;  Laterality: N/A;   ESOPHAGOGASTRODUODENOSCOPY (EGD) WITH PROPOFOL N/A 01/29/2023   Procedure: ESOPHAGOGASTRODUODENOSCOPY (EGD) WITH PROPOFOL;  Surgeon: Kerin Salen, MD;  Location: [facility];  Service: Gastroenterology;  Laterality: N/A;   HERNIA REPAIR     Age 37   ICD IMPLANT N/A 05/18/2020   Procedure: ICD IMPLANT;  Surgeon: Marinus Maw, MD;  Location: [facility] INVASIVE CV LAB;  Service: Cardiovascular;  Laterality: N/A;   LUMBAR LAMINECTOMY/DECOMPRESSION MICRODISCECTOMY N/A 12/15/2019   Procedure: LUMBAR THREE-SACRAL ONE LAMINECTOMY WITH LUMBAR FOUR-FIVE DISCECTOMY ;  Surgeon: Bethann Goo, DO;  Location: [facility] OR;  Service: Neurosurgery;  Laterality: N/A;   thoracocenteis      Allergies: Latex  Medications: Prior to Admission medications   Medication Sig Start Date End Date Taking? Authorizing Provider  albuterol (VENTOLIN HFA) 108 (90 Base) MCG/ACT inhaler Inhale into the lungs every 6 (six) hours as needed for wheezing or shortness of breath.    [provider]  azithromycin (ZITHROMAX) 250 MG tablet 250 mg. Dose pack 03/16/23  [provider]  benzonatate (TESSALON) 100 MG capsule Take 100 mg by mouth 3 (three) times daily as needed for cough.    [provider]   busPIRone (BUSPAR) 5 MG tablet Take 5 mg by mouth 2 (two) times daily.    [provider]  carboxymethylcellulose (REFRESH PLUS) 0.5 % SOLN Place 1 drop into both eyes 2 (two) times daily as needed.    [provider]  carvedilol (COREG) 12.5 MG tablet Take 1.5 tablets (18.75 mg total) by mouth 2 (two) times daily. Patient taking differently: Take 12.5 mg by mouth 2 (two) times daily. 12/29/20   Graciella Freer, PA-C  dapagliflozin propanediol (FARXIGA) 10 MG TABS tablet Take 10 mg by mouth daily.    [provider]  DULoxetine (CYMBALTA) 30 MG capsule Take 30 mg by mouth every evening.    [provider]  DULoxetine (CYMBALTA) 60 MG capsule Take 60 mg by mouth in the morning.    [provider]  ferrous sulfate 325 (65 FE) MG EC tablet Take 325 mg by mouth daily with breakfast.    [provider]  fluticasone (FLONASE) 50 MCG/ACT nasal spray Place 1 spray into both nostrils daily as needed for allergies or rhinitis.    [provider]  furosemide (LASIX) 40 MG tablet Take 1 tablet (40 mg total) by mouth daily. Two tabs twice daily 01/29/23   Lanae Boast, MD  glipiZIDE (GLUCOTROL) 10 MG tablet Take 1 tablet (10 mg total) by mouth 2 (two) times daily. 01/29/23   Lanae Boast, MD  guaiFENesin-codeine (ROBITUSSIN AC) 100-10 MG/5ML syrup Take 5 mLs by mouth 3 (three) times daily as needed for cough.    [provider]  hydrOXYzine (VISTARIL) 25 MG capsule Take 25 mg by mouth 3 (three) times daily as needed for anxiety.    [provider]  ipratropium-albuterol (DUONEB) 0.5-2.5 (3) MG/3ML SOLN Take 3 mLs by nebulization every 6 (six) hours as needed.    [provider]  lidocaine 4 % Place 1 patch onto the skin 2 (two) times daily as needed.    [provider]  montelukast (SINGULAIR) 10 MG tablet Take 10 mg by mouth daily.    [provider]  ondansetron (ZOFRAN) 4 MG tablet Take 4 mg by mouth  every 6 (six) hours as needed for nausea or vomiting.    [provider]  pantoprazole (PROTONIX) 40 MG tablet Take 40 mg by mouth daily.    [provider]  sitaGLIPtin (JANUVIA) 50 MG tablet Take 50 mg by mouth daily.    [provider]  spironolactone (ALDACTONE) 100 MG tablet Take 200 mg by mouth 2 (two) times daily.    [provider]     Family History  Problem Relation Age of Onset   Diabetes Mother    Heart failure Mother    Valvular heart disease Mother    Diabetes Mellitus II Father    Heart failure Father    Dementia Father    Seizures Neg Hx     Social History   Socioeconomic History   Marital status: Married    Spouse name: Not on file   Number of children: 2   Years of education: Not on file   Highest education level: High school graduate  Occupational History   Not on file  Tobacco Use   Smoking status: Former    Current packs/day: 0.00    Types: Cigarettes    Quit date: 2010  Years since quitting: 14.8   Smokeless tobacco: Never  Vaping Use   Vaping status: Never Used  Substance and Sexual Activity   Alcohol use: Yes    Comment: "pint every few days"   Drug use: No   Sexual activity: Yes  Other Topics Concern   Not on file  Social History Narrative   Lives at home with his wife   Right handed   No caffeine   Social Determinants of Health   Financial Resource Strain: Not on file  Food Insecurity: No Food Insecurity (01/25/2023)   Hunger Vital Sign    Worried About Running Out of Food in the Last Year: Never true    Ran Out of Food in the Last Year: Never true  Transportation Needs: No Transportation Needs (01/25/2023)   PRAPARE - Administrator, Civil Service (Medical): No    Lack of Transportation (Non-Medical): No  Physical Activity: Not on file  Stress: Not on file  Social Connections: Not on file    Review of Systems As above  Vital Signs: There were no vitals taken for this  visit.  Physical Exam  Deferred secondary to virtual visit.    Imaging:  BRTO protocol CT Abdomen, 01/26/23 Independently reviewed, showing R hepatic hydrothorax, patent PV, EVs from L gastric vein and small volume of perihepatic ascites. Anatomy appears favorable for TIPS placement from a RHV to RPV approach.     CUP PACEART INCLINIC DEVICE CHECK  Result Date: 03/21/2023 ICD check in clinic. Normal device function. Thresholds and sensing consistent with previous device measurements. Impedance trends stable over time. No mode switches. No ventricular arrhythmias. Histograms with R shift chronically. No changes made this session. Device programmed at appropriate safety margins. Estimated longevity 100%.  Connor Vaughn enrolled in remote follow-up. Patient education completed See attached   Labs:  CBC: Recent Labs    01/26/23 0452 01/27/23 0505 01/28/23 0501 01/29/23 0456  WBC 2.4* 2.8* 2.5* 3.3*  HGB 8.6* 8.0* 7.9* 8.3*  HCT 28.9* 26.1* 27.0* 27.9*  PLT 102* 111* 97* 107*    COAGS: Recent Labs    01/26/23 1001  INR 1.1    BMP: Recent Labs    01/26/23 0452 01/27/23 0505 01/28/23 0501 01/29/23 0456  NA 134* 135 134* 136  K 3.3* 3.2* 3.9 3.9  CL 96* 98 102 104  CO2 29 27 26 22   GLUCOSE 254* 179* 98 216*  BUN 13 16 12 15   CALCIUM 8.3* 8.3* 8.2* 8.6*  CREATININE 0.87 0.99 0.64 0.89  GFRNONAA >60 >60 >60 >60    LIVER FUNCTION TESTS: Recent Labs    01/24/23 1917 01/25/23 0512  BILITOT 0.6 0.9  AST 35 25  ALT 24 18  ALKPHOS 127* 94  PROT 6.6 5.8*  ALBUMIN 3.0* 3.0*    MELD 3.0: 9 at 01/27/2023  5:05 AM MELD-Na: 7 at 01/27/2023  5:05 AM Calculated from: Serum Creatinine: 0.99 mg/dL (Using min of 1 mg/dL) at 11/22/7104  2:69 AM Serum Sodium: 135 mmol/L at 01/27/2023  5:05 AM Total Bilirubin: 0.9 mg/dL (Using min of 1 mg/dL) at 08/28/5460  7:03 AM Serum Albumin: 3.0 g/dL at 5/0/0938  1:82 AM INR(ratio): 1.1 at 01/26/2023 10:01 AM Age at listing (hypothetical): 62  years Sex: Male at 01/27/2023  5:05 AM   Assessment and Plan:  62 y/o M w PMHx significant for CAD w VT s/p ICD, T2DM, back pain s/p spinal fusion, EtOH cirrhosis w bleeding EVs s/p banding (01/14/20) and recurrent R  hydrothorax .  Connor Vaughn is known to VIR s/p serial R thoracenteses, most recently 03/20/23 w 3.5 L drawn.  Risk stratification Pre TIPS: MELD 9 (01/27/23). Child-Pugh class B, 7 pts - moderate hepatic dysfunction. [place] Index of Post-TIPS Survival (FIPS) = -0.38 1 month / 96.9%, 3 Months / 89.8%, 6 Months / 85.0%. Favorable. NH3; <10 (02/20/21)   Hepatic encephalopathy, subjective grading as described, Grade 0 (None) TTE (01/26/23) with normal RV function.   Current pharmacologic encephalopathy prophylaxis/treatment: None Current diuretic regimen: furosemide 20 mg and spironolactone 100 mg BID   Risks and benefits of TRANSJUGULAR INTRAHEPATIC PORTOSYSTEMIC SHUNT (TIPS) were discussed with the patient and/or the patient's family including, but not limited to, infection, bleeding, damage to adjacent structures, worsening hepatic and/or cardiac function, worsening and/or the development of altered mental status/encephalopathy and death.   The procedure has been fully reviewed with the patient/patient's authorized representative. She has consented to the procedure.  *BRTO protocol CT Abdomen, (01/26/23) already performed and reviewed. No additional imaging required. *comorbid, and will require cardiac clearance and pre-anesthesia evaluation *once cleared, OK to proceed to schedule for TIPS + R thoracentesis, possible paracentesis and variceal embolization. *new MELD Labs (CBC, CMP, Coags) + NH3 on procedure day *please make [facility] aware for Appt transportation logistics *Anticipate TRH admission post procedure secondary to comorbidities  Thank you for this interesting consult.  I greatly enjoyed meeting OMARIEN TORR and look forward to participating in their care.   A copy of this report was sent to the requesting provider on this date.  Electronically Signed:  Roanna Banning, MD [facility]   Pager. [phone] Clinic. [phone]  I spent a total of 60 Minutes of non-face-to-face time in clinical consultation, greater than 50% of which was counseling/coordinating care for Connor Vaughn [facility] evaluation for portal hypertension and TIPS.

## 2023-04-03 ENCOUNTER — Telehealth: Payer: Self-pay | Admitting: Cardiology

## 2023-04-03 NOTE — Telephone Encounter (Signed)
   Pre-operative Risk Assessment    Patient Name: Connor Vaughn  DOB: 1960/06/22 MRN: [id number]      Request for Surgical Clearance    Procedure:   TIPS procedure  Date of Surgery:  Clearance  - TBD                               Surgeon:  Dr. Demetrios Isaacs Surgeon's Group or Practice Name:  [facility] Phone number:  [phone]  Fax number:  [phone]   Type of Clearance Requested:   - Medical    Type of Anesthesia:  General    Additional requests/questions:   Caller Victorino Dike) stated patient will need medical clearance.  Signed, Annetta Maw   04/03/2023, 9:28 AM

## 2023-04-03 NOTE — Telephone Encounter (Signed)
Patient cleared per office note from Otilio Saber, PA-C dated 03/21/2023. Note routed to requesting office. Will remove from pre-op pool.   Connor Vaughn. [name], DNP, NP-C  04/03/2023, 9:46 AM [facility] [address] Office [phone] Fax [phone]

## 2023-04-04 ENCOUNTER — Other Ambulatory Visit (HOSPITAL_COMMUNITY): Payer: Self-pay | Admitting: Interventional Radiology

## 2023-04-04 DIAGNOSIS — K7031 Alcoholic cirrhosis of liver with ascites: Secondary | ICD-10-CM

## 2023-04-07 ENCOUNTER — Telehealth (HOSPITAL_COMMUNITY): Payer: Self-pay | Admitting: Radiology

## 2023-04-07 NOTE — Telephone Encounter (Signed)
Called [facility] to schedule TIPS procedure for pt on 11/25 with Dr. Milford Cage at [facility]. Left VM for them to call me back. JM

## 2023-04-12 ENCOUNTER — Emergency Department (HOSPITAL_COMMUNITY): Payer: Medicare (Managed Care)

## 2023-04-12 ENCOUNTER — Other Ambulatory Visit: Payer: Self-pay

## 2023-04-12 ENCOUNTER — Inpatient Hospital Stay (HOSPITAL_COMMUNITY)
Admission: EM | Admit: 2023-04-12 | Discharge: 2023-04-19 | DRG: 406 | Disposition: A | Discharge status: Home / Self Care | Payer: Medicare (Managed Care) | Attending: Internal Medicine | Admitting: Internal Medicine

## 2023-04-12 ENCOUNTER — Encounter (HOSPITAL_COMMUNITY): Payer: Self-pay

## 2023-04-12 DIAGNOSIS — J90 Pleural effusion, not elsewhere classified: Secondary | ICD-10-CM | POA: Diagnosis present

## 2023-04-12 DIAGNOSIS — B182 Chronic viral hepatitis C: Secondary | ICD-10-CM | POA: Insufficient documentation

## 2023-04-12 DIAGNOSIS — Z1152 Encounter for screening for COVID-19: Secondary | ICD-10-CM

## 2023-04-12 DIAGNOSIS — E1122 Type 2 diabetes mellitus with diabetic chronic kidney disease: Secondary | ICD-10-CM | POA: Diagnosis present

## 2023-04-12 DIAGNOSIS — F1090 Alcohol use, unspecified, uncomplicated: Secondary | ICD-10-CM | POA: Diagnosis present

## 2023-04-12 DIAGNOSIS — Z7984 Long term (current) use of oral hypoglycemic drugs: Secondary | ICD-10-CM

## 2023-04-12 DIAGNOSIS — J948 Other specified pleural conditions: Secondary | ICD-10-CM | POA: Diagnosis not present

## 2023-04-12 DIAGNOSIS — I350 Nonrheumatic aortic (valve) stenosis: Secondary | ICD-10-CM

## 2023-04-12 DIAGNOSIS — Z87891 Personal history of nicotine dependence: Secondary | ICD-10-CM

## 2023-04-12 DIAGNOSIS — M48062 Spinal stenosis, lumbar region with neurogenic claudication: Secondary | ICD-10-CM | POA: Diagnosis present

## 2023-04-12 DIAGNOSIS — F32A Depression, unspecified: Secondary | ICD-10-CM | POA: Diagnosis present

## 2023-04-12 DIAGNOSIS — J918 Pleural effusion in other conditions classified elsewhere: Secondary | ICD-10-CM | POA: Diagnosis present

## 2023-04-12 DIAGNOSIS — K7031 Alcoholic cirrhosis of liver with ascites: Secondary | ICD-10-CM | POA: Diagnosis not present

## 2023-04-12 DIAGNOSIS — N1831 Chronic kidney disease, stage 3a: Secondary | ICD-10-CM | POA: Diagnosis present

## 2023-04-12 DIAGNOSIS — I8510 Secondary esophageal varices without bleeding: Secondary | ICD-10-CM | POA: Diagnosis present

## 2023-04-12 DIAGNOSIS — I129 Hypertensive chronic kidney disease with stage 1 through stage 4 chronic kidney disease, or unspecified chronic kidney disease: Secondary | ICD-10-CM | POA: Diagnosis present

## 2023-04-12 DIAGNOSIS — K769 Liver disease, unspecified: Secondary | ICD-10-CM | POA: Diagnosis present

## 2023-04-12 DIAGNOSIS — Z95810 Presence of automatic (implantable) cardiac defibrillator: Secondary | ICD-10-CM | POA: Diagnosis present

## 2023-04-12 DIAGNOSIS — Z95828 Presence of other vascular implants and grafts: Principal | ICD-10-CM

## 2023-04-12 DIAGNOSIS — M48061 Spinal stenosis, lumbar region without neurogenic claudication: Secondary | ICD-10-CM | POA: Insufficient documentation

## 2023-04-12 DIAGNOSIS — E118 Type 2 diabetes mellitus with unspecified complications: Secondary | ICD-10-CM | POA: Diagnosis not present

## 2023-04-12 DIAGNOSIS — Z79899 Other long term (current) drug therapy: Secondary | ICD-10-CM

## 2023-04-12 DIAGNOSIS — K766 Portal hypertension: Secondary | ICD-10-CM | POA: Diagnosis present

## 2023-04-12 DIAGNOSIS — Z66 Do not resuscitate: Secondary | ICD-10-CM | POA: Diagnosis present

## 2023-04-12 DIAGNOSIS — N319 Neuromuscular dysfunction of bladder, unspecified: Secondary | ICD-10-CM | POA: Diagnosis present

## 2023-04-12 DIAGNOSIS — Z8249 Family history of ischemic heart disease and other diseases of the circulatory system: Secondary | ICD-10-CM

## 2023-04-12 DIAGNOSIS — Z833 Family history of diabetes mellitus: Secondary | ICD-10-CM

## 2023-04-12 DIAGNOSIS — I8501 Esophageal varices with bleeding: Secondary | ICD-10-CM

## 2023-04-12 DIAGNOSIS — Z91040 Latex allergy status: Secondary | ICD-10-CM

## 2023-04-12 DIAGNOSIS — G4730 Sleep apnea, unspecified: Secondary | ICD-10-CM | POA: Diagnosis present

## 2023-04-12 DIAGNOSIS — I422 Other hypertrophic cardiomyopathy: Secondary | ICD-10-CM

## 2023-04-12 DIAGNOSIS — Z981 Arthrodesis status: Secondary | ICD-10-CM

## 2023-04-12 DIAGNOSIS — F419 Anxiety disorder, unspecified: Secondary | ICD-10-CM | POA: Diagnosis present

## 2023-04-12 DIAGNOSIS — D61818 Other pancytopenia: Secondary | ICD-10-CM | POA: Diagnosis present

## 2023-04-12 DIAGNOSIS — K4090 Unilateral inguinal hernia, without obstruction or gangrene, not specified as recurrent: Secondary | ICD-10-CM | POA: Diagnosis present

## 2023-04-12 DIAGNOSIS — I2510 Atherosclerotic heart disease of native coronary artery without angina pectoris: Secondary | ICD-10-CM | POA: Diagnosis present

## 2023-04-12 HISTORY — DX: Esophageal varices with bleeding: I85.01

## 2023-04-12 LAB — COMPREHENSIVE METABOLIC PANEL
ALT: 26 U/L (ref 0–44)
AST: 41 U/L (ref 15–41)
Albumin: 3.3 g/dL — ABNORMAL LOW (ref 3.5–5.0)
Alkaline Phosphatase: 161 U/L — ABNORMAL HIGH (ref 38–126)
Anion gap: 8 (ref 5–15)
BUN: 6 mg/dL (ref 6–20)
CO2: 25 mmol/L (ref 22–32)
Calcium: 8.9 mg/dL (ref 8.9–10.3)
Chloride: 103 mmol/L (ref 98–111)
Creatinine, Ser: 0.69 mg/dL (ref 0.61–1.24)
GFR, Estimated: >60 mL/min (ref >60)
Glucose, Bld: 315 mg/dL — ABNORMAL HIGH (ref 70–99)
Potassium: 3.8 mmol/L (ref 3.5–5.1)
Sodium: 136 mmol/L (ref 135–145)
Total Bilirubin: 0.8 mg/dL (ref <1.2)
Total Protein: 7.4 g/dL (ref 6.5–8.1)

## 2023-04-12 LAB — CBC
HCT: 39.3 % (ref 39.0–52.0)
Hemoglobin: 11.1 g/dL — ABNORMAL LOW (ref 13.0–17.0)
MCH: 19.6 pg — ABNORMAL LOW (ref 26.0–34.0)
MCHC: 28.2 g/dL — ABNORMAL LOW (ref 30.0–36.0)
MCV: 69.3 fL — ABNORMAL LOW (ref 80.0–100.0)
Platelets: 112 10*3/uL — ABNORMAL LOW (ref 150–400)
RBC: 5.67 MIL/uL (ref 4.22–5.81)
RDW: 21 % — ABNORMAL HIGH (ref 11.5–15.5)
WBC: 3.6 10*3/uL — ABNORMAL LOW (ref 4.0–10.5)
nRBC: 0 % (ref 0.0–0.2)

## 2023-04-12 LAB — URINALYSIS, ROUTINE W REFLEX MICROSCOPIC
Bacteria, UA: NONE SEEN
Bilirubin Urine: NEGATIVE
Glucose, UA: >=500 mg/dL — AB
Ketones, ur: 20 mg/dL — AB
Leukocytes,Ua: NEGATIVE
Nitrite: NEGATIVE
Protein, ur: 30 mg/dL — AB
Specific Gravity, Urine: 1.042 — ABNORMAL HIGH (ref 1.005–1.030)
pH: 6 (ref 5.0–8.0)

## 2023-04-12 LAB — LIPASE, BLOOD: Lipase: 127 U/L — ABNORMAL HIGH (ref 11–51)

## 2023-04-12 LAB — PROTIME-INR
INR: 1 (ref 0.8–1.2)
Prothrombin Time: 13.3 s (ref 11.4–15.2)

## 2023-04-12 LAB — TROPONIN I (HIGH SENSITIVITY)
Troponin I (High Sensitivity): 4 ng/L (ref <18)
Troponin I (High Sensitivity): 4 ng/L (ref <18)

## 2023-04-12 LAB — BRAIN NATRIURETIC PEPTIDE: B Natriuretic Peptide: 59.5 pg/mL (ref 0.0–100.0)

## 2023-04-12 MED ORDER — LIDOCAINE 4 % EX PTCH: 1.0000 | MEDICATED_PATCH | Freq: Two times a day (BID) | CUTANEOUS | Status: DC | PRN

## 2023-04-12 MED ORDER — SODIUM CHLORIDE 0.9% FLUSH
3.0000 mL | Freq: Two times a day (BID) | INTRAVENOUS | Status: DC
  Administered 2023-04-12 – 2023-04-19 (×13): 3 mL via INTRAVENOUS

## 2023-04-12 MED ORDER — DULOXETINE HCL 60 MG PO CPEP
90.0000 mg | ORAL_CAPSULE | Freq: Every day | ORAL | Status: DC
  Administered 2023-04-13 – 2023-04-19 (×6): 90 mg via ORAL
  Filled 2023-04-12 (×6): qty 1

## 2023-04-12 MED ORDER — FUROSEMIDE 20 MG PO TABS: 40.0000 mg | ORAL_TABLET | Freq: Two times a day (BID) | ORAL | Status: DC

## 2023-04-12 MED ORDER — PREGABALIN 75 MG PO CAPS
150.0000 mg | ORAL_CAPSULE | Freq: Two times a day (BID) | ORAL | Status: DC
  Administered 2023-04-12 – 2023-04-19 (×13): 150 mg via ORAL
  Filled 2023-04-12 (×13): qty 2

## 2023-04-12 MED ORDER — ALBUTEROL SULFATE (2.5 MG/3ML) 0.083% IN NEBU: 2.5000 mg | INHALATION_SOLUTION | Freq: Four times a day (QID) | RESPIRATORY_TRACT | Status: DC | PRN

## 2023-04-12 MED ORDER — FUROSEMIDE 40 MG PO TABS
40.0000 mg | ORAL_TABLET | Freq: Two times a day (BID) | ORAL | Status: DC
  Administered 2023-04-13 – 2023-04-16 (×8): 40 mg via ORAL
  Filled 2023-04-12 (×8): qty 1

## 2023-04-12 MED ORDER — POLYVINYL ALCOHOL 1.4 % OP SOLN
1.0000 [drp] | Freq: Two times a day (BID) | OPHTHALMIC | Status: DC | PRN
  Filled 2023-04-12: qty 15

## 2023-04-12 MED ORDER — HYDROXYZINE HCL 25 MG PO TABS
25.0000 mg | ORAL_TABLET | Freq: Three times a day (TID) | ORAL | Status: DC | PRN
  Administered 2023-04-12: 25 mg via ORAL
  Filled 2023-04-12 (×2): qty 1

## 2023-04-12 MED ORDER — ACETAMINOPHEN 325 MG PO TABS
650.0000 mg | ORAL_TABLET | Freq: Four times a day (QID) | ORAL | Status: DC | PRN
  Administered 2023-04-13 – 2023-04-14 (×2): 650 mg via ORAL
  Filled 2023-04-12 (×2): qty 2

## 2023-04-12 MED ORDER — LIDOCAINE 5 % EX PTCH
1.0000 | MEDICATED_PATCH | Freq: Two times a day (BID) | CUTANEOUS | Status: DC | PRN
  Administered 2023-04-12: 1 via TRANSDERMAL
  Filled 2023-04-12: qty 1

## 2023-04-12 MED ORDER — CARBOXYMETHYLCELLULOSE SODIUM 0.5 % OP SOLN: 1.0000 [drp] | Freq: Two times a day (BID) | OPHTHALMIC | Status: DC | PRN

## 2023-04-12 MED ORDER — IOHEXOL 350 MG/ML SOLN
75.0000 mL | Freq: Once | INTRAVENOUS | Status: AC | PRN
  Administered 2023-04-12: 75 mL via INTRAVENOUS

## 2023-04-12 MED ORDER — ALBUTEROL SULFATE HFA 108 (90 BASE) MCG/ACT IN AERS: 1.0000 | INHALATION_SPRAY | Freq: Four times a day (QID) | RESPIRATORY_TRACT | Status: DC | PRN

## 2023-04-12 MED ORDER — SPIRONOLACTONE 25 MG PO TABS
200.0000 mg | ORAL_TABLET | Freq: Two times a day (BID) | ORAL | Status: DC
  Administered 2023-04-13 – 2023-04-16 (×6): 200 mg via ORAL
  Filled 2023-04-12 (×6): qty 8

## 2023-04-12 MED ORDER — BUSPIRONE HCL 5 MG PO TABS
5.0000 mg | ORAL_TABLET | Freq: Two times a day (BID) | ORAL | Status: DC
  Administered 2023-04-12 – 2023-04-19 (×13): 5 mg via ORAL
  Filled 2023-04-12 (×13): qty 1

## 2023-04-12 MED ORDER — PANTOPRAZOLE SODIUM 40 MG PO TBEC
40.0000 mg | DELAYED_RELEASE_TABLET | Freq: Every day | ORAL | Status: DC
  Administered 2023-04-13 – 2023-04-19 (×6): 40 mg via ORAL
  Filled 2023-04-12 (×6): qty 1

## 2023-04-12 MED ORDER — POLYETHYLENE GLYCOL 3350 17 G PO PACK
17.0000 g | PACK | Freq: Every day | ORAL | Status: DC | PRN
Start: 2023-04-12 — End: 2023-04-19

## 2023-04-12 MED ORDER — CARVEDILOL 6.25 MG PO TABS
18.7500 mg | ORAL_TABLET | Freq: Two times a day (BID) | ORAL | Status: DC
  Administered 2023-04-12 – 2023-04-19 (×13): 18.75 mg via ORAL
  Filled 2023-04-12 (×2): qty 1
  Filled 2023-04-12: qty 2
  Filled 2023-04-12 (×11): qty 1

## 2023-04-12 MED ORDER — ENOXAPARIN SODIUM 40 MG/0.4ML IJ SOSY
40.0000 mg | PREFILLED_SYRINGE | INTRAMUSCULAR | Status: DC
  Administered 2023-04-12 – 2023-04-17 (×5): 40 mg via SUBCUTANEOUS
  Filled 2023-04-12 (×5): qty 0.4

## 2023-04-12 MED ORDER — HYDROXYZINE PAMOATE 25 MG PO CAPS: 25.0000 mg | ORAL_CAPSULE | Freq: Three times a day (TID) | ORAL | Status: DC | PRN

## 2023-04-12 MED ORDER — ACETAMINOPHEN 650 MG RE SUPP: 650.0000 mg | Freq: Four times a day (QID) | RECTAL | Status: DC | PRN

## 2023-04-12 NOTE — ED Notes (Signed)
ED TO INPATIENT HANDOFF REPORT  ED Nurse Name and Phone #:  Minerva Areola [phone]  R Name/Age/Gender Connor Vaughn 62 y.o. male Room/Bed: 022C/022C  Code Status   Code Status: Limited: Do not attempt resuscitation (DNR) -DNR-LIMITED -Do Not Intubate/DNI   Home/SNF/Other Home Patient oriented to: self, place, time, and situation Is this baseline? 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 Pt is tachypneic. 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]  Admitting Physician: Synetta Fail [[id number]]  Attending Physician: Synetta Fail [[id number]]          B Medical/Surgery History Past Medical History:  Diagnosis Date   Alcohol abuse 12/24/2015   Cirrhosis (HCC)    Diabetes mellitus, type II (HCC)    DKA (diabetic ketoacidoses) 04/04/2019   Esophageal varices with bleeding (HCC) 04/12/2023   H/O fracture    nasal x 2   Sleep apnea    UGIB (upper gastrointestinal bleed) 12/2019   Past Surgical History:  Procedure Laterality Date   ESOPHAGEAL BANDING  01/14/2020   Procedure: ESOPHAGEAL VARICES BANDING;  Surgeon: Jeani Hawking, MD;  Location: [facility];  Service: Endoscopy;;   ESOPHAGEAL BANDING  01/29/2023   Procedure: ESOPHAGEAL BANDING;  Surgeon: Kerin Salen, MD;  Location: [facility];  Service: Gastroenterology;;   ESOPHAGOGASTRODUODENOSCOPY N/A 01/13/2020    Procedure: ESOPHAGOGASTRODUODENOSCOPY (EGD);  Surgeon: Charna Elizabeth, MD;  Location: [facility];  Service: Endoscopy;  Laterality: N/A;   ESOPHAGOGASTRODUODENOSCOPY (EGD) WITH PROPOFOL N/A 04/05/2019   Procedure: ESOPHAGOGASTRODUODENOSCOPY (EGD) WITH PROPOFOL;  Surgeon: Jeani Hawking, MD;  Location: [facility];  Service: Endoscopy;  Laterality: N/A;   ESOPHAGOGASTRODUODENOSCOPY (EGD) WITH PROPOFOL N/A 01/14/2020   Procedure: ESOPHAGOGASTRODUODENOSCOPY (EGD) WITH PROPOFOL;  Surgeon: Jeani Hawking, MD;  Location: [facility];  Service: Endoscopy;  Laterality: N/A;   ESOPHAGOGASTRODUODENOSCOPY (EGD) WITH PROPOFOL N/A 01/29/2023   Procedure: ESOPHAGOGASTRODUODENOSCOPY (EGD) WITH PROPOFOL;  Surgeon: Kerin Salen, MD;  Location: [facility];  Service: Gastroenterology;  Laterality: N/A;   HERNIA REPAIR     Age 65   ICD IMPLANT N/A 05/18/2020   Procedure: ICD IMPLANT;  Surgeon: Marinus Maw, MD;  Location: [facility] INVASIVE CV LAB;  Service: Cardiovascular;  Laterality: N/A;   IR RADIOLOGIST EVAL & MGMT  03/31/2023   LUMBAR LAMINECTOMY/DECOMPRESSION MICRODISCECTOMY N/A 12/15/2019   Procedure: LUMBAR THREE-SACRAL ONE LAMINECTOMY WITH LUMBAR FOUR-FIVE DISCECTOMY ;  Surgeon: Bethann Goo, DO;  Location: [facility] OR;  Service: Neurosurgery;  Laterality: N/A;   thoracocenteis       A IV Location/Drains/Wounds Patient Lines/Drains/Airways Status     Active Line/Drains/Airways     Name Placement date Placement time Site Days   Peripheral IV 04/12/23 20 G Right Antecubital 04/12/23  1225  Antecubital  less than 1            Intake/Output Last 24 hours No intake or output data in the 24 hours ending 04/12/23  2012  Labs/Imaging Results for orders placed or performed during the hospital encounter of 04/12/23 (from the past 48 hour(s))  CBC     Status: Abnormal   Collection Time: 04/12/23 12:20 PM  Result Value Ref Range   WBC 3.6 (L) 4.0 - 10.5 K/uL   RBC 5.67 4.22 - 5.81 MIL/uL   Hemoglobin 11.1 (L)  13.0 - 17.0 g/dL   HCT 16.1 09.6 - 04.5 %   MCV 69.3 (L) 80.0 - 100.0 fL   MCH 19.6 (L) 26.0 - 34.0 pg   MCHC 28.2 (L) 30.0 - 36.0 g/dL   RDW 40.9 (H) 81.1 - 91.4 %   Platelets 112 (L) 150 - 400 K/uL    Comment: REPEATED TO VERIFY   nRBC 0.0 0.0 - 0.2 %    Comment: Performed at [facility], [address] [place], [place] [address]  Troponin I (High Sensitivity)     Status: None   Collection Time: 04/12/23 12:20 PM  Result Value Ref Range   Troponin I (High Sensitivity) 4 <18 ng/L    Comment: (NOTE) Elevated high sensitivity troponin I (hsTnI) values and significant  changes across serial measurements may suggest ACS but many other  chronic and acute conditions are known to elevate hsTnI results.  Refer to the "Links" section for chest pain algorithms and additional  guidance. Performed at [facility], [address], [place], [place] [address]   Protime-INR     Status: None   Collection Time: 04/12/23 12:20 PM  Result Value Ref Range   Prothrombin Time 13.3 11.4 - 15.2 seconds   INR 1.0 0.8 - 1.2    Comment: (NOTE) INR goal varies based on device and disease states. Performed at [facility], [address] [place], [place] [address]   Comprehensive metabolic panel     Status: Abnormal   Collection Time: 04/12/23 12:20 PM  Result Value Ref Range   Sodium 136 135 - 145 mmol/L   Potassium 3.8 3.5 - 5.1 mmol/L   Chloride 103 98 - 111 mmol/L   CO2 25 22 - 32 mmol/L   Glucose, Bld 315 (H) 70 - 99 mg/dL    Comment: Glucose reference range applies only to samples taken after fasting for at least 8 hours.   BUN 6 6 - 20 mg/dL   Creatinine, Ser 4.69 0.61 - 1.24 mg/dL   Calcium 8.9 8.9 - 62.9 mg/dL   Total Protein 7.4 6.5 - 8.1 g/dL   Albumin 3.3 (L) 3.5 - 5.0 g/dL   AST 41 15 - 41 U/L   ALT 26 0 - 44 U/L   Alkaline Phosphatase 161 (H) 38 - 126 U/L   Total Bilirubin 0.8 <1.2 mg/dL   GFR, Estimated >52 >84 mL/min    Comment: (NOTE) Calculated using the  CKD-EPI Creatinine Equation (2021)    Anion gap 8 5 - 15    Comment: Performed at [facility], [address], [place], [place] [address]  Lipase, blood     Status: Abnormal   Collection Time: 04/12/23 12:20 PM  Result Value Ref Range   Lipase 127 (H) 11 - 51 U/L    Comment: Performed at [facility], [address], [place], [place] [address]  Brain natriuretic peptide     Status: None   Collection Time: 04/12/23 12:20 PM  Result Value Ref Range   B Natriuretic Peptide 59.5 0.0 - 100.0 pg/mL    Comment:  Performed at [facility], [address] [place], [place] [address]  Troponin I (High Sensitivity)     Status: None   Collection Time: 04/12/23  2:16 PM  Result Value Ref Range   Troponin I (High Sensitivity) 4 <18 ng/L    Comment: (NOTE) Elevated high sensitivity troponin I (hsTnI) values and significant  changes across serial measurements may suggest ACS but many other  chronic and acute conditions are known to elevate hsTnI results.  Refer to the "Links" section for chest pain algorithms and additional  guidance. Performed at [facility], [address] [place], [place] [address]   Urinalysis, Routine w reflex microscopic -Urine, Clean Catch     Status: Abnormal   Collection Time: 04/12/23  2:40 PM  Result Value Ref Range   Color, Urine YELLOW YELLOW   APPearance CLEAR CLEAR   Specific Gravity, Urine 1.042 (H) 1.005 - 1.030   pH 6.0 5.0 - 8.0   Glucose, UA >=500 (A) NEGATIVE mg/dL   Hgb urine dipstick SMALL (A) NEGATIVE   Bilirubin Urine NEGATIVE NEGATIVE   Ketones, ur 20 (A) NEGATIVE mg/dL   Protein, ur 30 (A) NEGATIVE mg/dL   Nitrite NEGATIVE NEGATIVE   Leukocytes,Ua NEGATIVE NEGATIVE   RBC / HPF 6-10 0 - 5 RBC/hpf   WBC, UA 0-5 0 - 5 WBC/hpf   Bacteria, UA NONE SEEN NONE SEEN   Squamous Epithelial / HPF 0-5 0 - 5 /HPF   Mucus PRESENT     Comment: Performed at [facility], [address], [place], [place] [address]   CT CHEST  ABDOMEN PELVIS W CONTRAST  Result Date: 04/12/2023 CLINICAL DATA:  Hepatic hydrothorax, recurrent pleural effusion cirrhosis, distended abdomen, groin and leg swelling EXAM: CT CHEST, ABDOMEN, AND PELVIS WITH CONTRAST TECHNIQUE: Multidetector CT imaging of the chest, abdomen and pelvis was performed following the standard protocol during bolus administration of intravenous contrast. RADIATION DOSE REDUCTION: This exam was performed according to the departmental dose-optimization program which includes automated exposure control, adjustment of the mA and/or kV according to patient size and/or use of iterative reconstruction technique. CONTRAST:  75mL OMNIPAQUE IOHEXOL 350 MG/ML SOLN COMPARISON:  01/26/2023 FINDINGS: CT CHEST FINDINGS Cardiovascular: Left chest pacer defibrillator. Aortic valve calcifications. Scattered aortic atherosclerosis. Normal heart size. Left coronary artery calcifications. No pericardial effusion. Mediastinum/Nodes: No enlarged mediastinal, hilar, or axillary lymph nodes. Thyroid gland, trachea, and esophagus demonstrate no significant findings. Lungs/Pleura: Large right pleural effusion and associated atelectasis or consolidation. No left-sided pleural effusion. Left lung is normally aerated. Musculoskeletal: Bilateral gynecomastia.  No acute osseous findings. CT ABDOMEN PELVIS FINDINGS Hepatobiliary: Coarse, nodular cirrhotic morphology of the liver. No focal liver lesion. No gallstones, gallbladder wall thickening, or biliary dilatation. Pancreas: Calcifications within the pancreatic head, stigmata of chronic pancreatitis (series 3, image 70). No pancreatic ductal dilatation or surrounding inflammatory changes. Spleen: Splenomegaly, maximum coronal span 15.9 cm Adrenals/Urinary Tract: Adrenal glands are unremarkable. Kidneys are normal, without renal calculi, solid lesion, or hydronephrosis. Bladder is unremarkable. Stomach/Bowel: Stomach is within normal limits. Appendix appears  normal. No evidence of bowel wall thickening, distention, or inflammatory changes. Vascular/Lymphatic: Aortic atherosclerosis. Gastroesophageal varices (series 3, image 46). No enlarged abdominal or pelvic lymph nodes. Reproductive: No mass or other abnormality. Other: Anasarca. Large, fluid containing right inguinal hernia large volume ascites throughout the abdomen and pelvis, somewhat increased compared to prior examination. Musculoskeletal: No acute osseous findings. IMPRESSION: 1. Large right pleural effusion and associated atelectasis or consolidation. 2.  Cirrhosis and portal hypertension. No focal liver lesion. 3. Large volume ascites throughout the abdomen and pelvis, somewhat increased compared to prior examination. Anasarca. 4. Large, fluid containing right inguinal hernia. 5. Coronary artery disease. Aortic Atherosclerosis (ICD10-I70.0). Electronically Signed   By: Jearld Lesch M.D.   On: 04/12/2023 17:33   DG Chest 2 View  Result Date: 04/12/2023 CLINICAL DATA:  Chest pain. EXAM: CHEST - 2 VIEW COMPARISON:  03/20/2023. FINDINGS: Since the prior study, there is opacification of the lower 3/4 of the right hemithorax. Findings may represent large pleural effusion and underlying compressive atelectatic changes of the right lung with or without associated consolidation. There is probable layering and more atelectasis in the right upper lung zone. Left lung and left costophrenic angle are clear. Evaluation of cardiomediastinal silhouette is nondiagnostic due to right lower hemithorax opacification. There is a left sided ICD device. No acute osseous abnormalities. The soft tissues are within normal limits. IMPRESSION: *Opacification of right hemithorax with sparing of the right upper lung zone, as described above. Electronically Signed   By: Jules Schick M.D.   On: 04/12/2023 15:41    Pending Labs Unresulted Labs (From admission, onward)     Start     Ordered   04/19/23 0500  Creatinine, serum   (enoxaparin (LOVENOX)    CrCl >/= 30 ml/min)  Weekly,   R     Comments: while on enoxaparin therapy    04/12/23 1826   04/13/23 0500  Comprehensive metabolic panel  Tomorrow morning,   R        04/12/23 1826   04/13/23 0500  CBC  Tomorrow morning,   R        04/12/23 1826   04/13/23 0500  Protime-INR  Tomorrow morning,   R        04/12/23 1826            Vitals/Pain Today's Vitals   04/12/23 1730 04/12/23 1745 04/12/23 1800 04/12/23 1900  BP: (!) 142/92 (!) 164/97 (!) 138/91 (!) 140/86  Pulse:  (!) 104 (!) 103 (!) 103  Resp: (!) 21 (!) 34 20 20  Temp:    98 F (36.7 C)  TempSrc:      SpO2:  100% 92% 100%  Weight:      Height:      PainSc:        Isolation Precautions No active isolations  Medications Medications  spironolactone (ALDACTONE) tablet 200 mg (has no administration in time range)  carvedilol (COREG) tablet 18.75 mg (has no administration in time range)  DULoxetine (CYMBALTA) DR capsule 90 mg (has no administration in time range)  busPIRone (BUSPAR) tablet 5 mg (has no administration in time range)  pantoprazole (PROTONIX) EC tablet 40 mg (has no administration in time range)  pregabalin (LYRICA) capsule 150 mg (has no administration in time range)  enoxaparin (LOVENOX) injection 40 mg (40 mg Subcutaneous Given 04/12/23 1911)  sodium chloride flush (NS) 0.9 % injection 3 mL (has no administration in time range)  acetaminophen (TYLENOL) tablet 650 mg (has no administration in time range)    Or  acetaminophen (TYLENOL) suppository 650 mg (has no administration in time range)  polyethylene glycol (MIRALAX / GLYCOLAX) packet 17 g (has no administration in time range)  furosemide (LASIX) tablet 40 mg (has no administration in time range)  hydrOXYzine (ATARAX) tablet 25 mg (has no administration in time range)  polyvinyl alcohol (LIQUIFILM TEARS) 1.4 % ophthalmic solution 1 drop (has no administration in time  range)  lidocaine (LIDODERM) 5 % 1 patch (has no  administration in time range)  albuterol (PROVENTIL) (2.5 MG/3ML) 0.083% nebulizer solution 2.5 mg (has no administration in time range)  iohexol (OMNIPAQUE) 350 MG/ML injection 75 mL (75 mLs Intravenous Contrast Given 04/12/23 1459)    Mobility walks     Focused Assessments Cardiac Assessment Handoff:    Lab Results  Component Value Date   CKTOTAL 97 04/09/2019   TROPONINI <0.03 06/26/2017   Lab Results  Component Value Date   DDIMER 0.69 (H) 02/20/2021   Does the Patient currently have chest pain? No    R Recommendations: See Admitting Provider Note  Report given to:   Additional Notes: cooperative, good IV, no complaints

## 2023-04-12 NOTE — ED Provider Notes (Signed)
Care assumed Connor Kanner, PA-C at shift change pending CT chest, abdomen, pelvis and chest x-ray.  Patient likely will require admission.  See his note for full HPI.  In short, patient is a 62 year old male who presents to the ED due to abdominal distention and shortness of breath.  History of alcoholic cirrhosis.  No longer drinks alcohol chronically.  Has had 14 thoracentesis in the past.  Had 1 on 10/28.  Follows with GI in [place]. Physical Exam  BP (!) 132/93   Pulse 99   Temp 97.8 F (36.6 C) (Oral)   Resp 19   Ht 5\' 6"  (1.676 m)   Wt 70.7 kg   SpO2 100%   BMI 25.16 kg/m   Physical Exam Vitals and nursing note reviewed.  Constitutional:      General: He is not in acute distress.    Appearance: He is not ill-appearing.  HENT:     Head: Normocephalic.  Eyes:     Pupils: Pupils are equal, round, and reactive to light.  Cardiovascular:     Rate and Rhythm: Normal rate and regular rhythm.     Pulses: Normal pulses.     Heart sounds: Normal heart sounds. No murmur heard.    No friction rub. No gallop.  Pulmonary:     Effort: Pulmonary effort is normal.     Breath sounds: Normal breath sounds.  Abdominal:     General: Abdomen is flat. There is distension.     Palpations: Abdomen is soft.     Tenderness: There is no abdominal tenderness. There is no guarding or rebound.  Musculoskeletal:        General: Normal range of motion.     Cervical back: Neck supple.  Skin:    General: Skin is warm and dry.  Neurological:     General: No focal deficit present.     Mental Status: He is alert.  Psychiatric:        Mood and Affect: Mood normal.        Behavior: Behavior normal.     Procedures  Procedures  ED Course / MDM    Medical Decision Making Amount and/or Complexity of Data Reviewed Labs: ordered. Decision-making details documented in ED Course. Radiology: ordered and independent interpretation performed. Decision-making details documented in ED  Course.  Risk Prescription drug management. Decision regarding hospitalization.   Care assumed Connor Kanner, PA-C at shift change pending CT chest, abdomen, pelvis and chest x-ray.  Patient likely will require admission.  See his note for full MDM.  62 year old male with history of alcoholic cirrhosis presents to the ED due to abdominal distention and shortness of breath x 3 weeks.  Has had 14 thoracentesis in the past.  Last thoracentesis on 10/28.  CBC significant for leukopenia 3.6.  Hemoglobin at 11.1.  And platelets at 112.  CMP reassuring.  Normal renal function.  No major electrolyte derangements.  Hyperglycemia at 315.  No anion gap.  Low suspicion for DKA.  UA negative for signs of infection.  Lipase mildly elevated at 127.  BNP normal.  5:59 PM reassessed patient at bedside.  O2 saturation above 95% on room air.  CT chest, abdomen, pelvis personally reviewed and interpreted demonstrates a large right pleural effusion.  Also demonstrates large volume ascites.  Given significant anasarca patient will require admission to the hospital.  No evidence of respiratory distress.  Patient On room air. Low suspicion for SBP.  6:18 PM Discussed with Dr. Alinda Money with [facility] who  agrees to admit patient.        Mannie Stabile, PA-C 04/12/23 1834    Rozelle Logan, DO 04/15/23 1322

## 2023-04-12 NOTE — ED Provider Notes (Signed)
[place] [facility] AT [facility] Provider Note   CSN: [id number] Arrival date & time: 04/12/23  1131     History  Chief Complaint  Patient presents with   Shortness of Breath    Connor Vaughn is a 62 y.o. male history of cirrhosis with ascites, pleural effusions, DNR, portal hypertension, ICD, NSVT, diabetes, hypertrophic cardiomyopathy, alcohol use presented for abdominal distention with shortness of breath.  Patient states that Connor Vaughn has had thoracenteses in the past but has never had an abdominal tap.  Patient states that Connor Vaughn has had 15 thoracenteses in the past and that this feels very similar as Connor Vaughn has repetitive right pleural effusions.  Patient denies any coughing, emesis, nauseous vomiting, as patient/diarrhea patient also notes that the abdominal distention is new states Connor Vaughn does have a inguinal hernia there but denies any fevers.  Patient denies any chest pain or his defibrillator going off.  Home Medications Prior to Admission medications   Medication Sig Start Date End Date Taking? Authorizing Provider  albuterol (VENTOLIN HFA) 108 (90 Base) MCG/ACT inhaler Inhale into the lungs every 6 (six) hours as needed for wheezing or shortness of breath.    [provider]  azithromycin (ZITHROMAX) 250 MG tablet 250 mg. Dose pack 03/16/23   [provider]  benzonatate (TESSALON) 100 MG capsule Take 100 mg by mouth 3 (three) times daily as needed for cough.    [provider]  busPIRone (BUSPAR) 5 MG tablet Take 5 mg by mouth 2 (two) times daily.    [provider]  carboxymethylcellulose (REFRESH PLUS) 0.5 % SOLN Place 1 drop into both eyes 2 (two) times daily as needed.    [provider]  carvedilol (COREG) 12.5 MG tablet Take 1.5 tablets (18.75 mg total) by mouth 2 (two) times daily. Patient taking differently: Take 12.5 mg by mouth 2 (two) times daily. 12/29/20   Graciella Freer, PA-C  dapagliflozin  propanediol (FARXIGA) 10 MG TABS tablet Take 10 mg by mouth daily.    [provider]  DULoxetine (CYMBALTA) 30 MG capsule Take 30 mg by mouth every evening.    [provider]  DULoxetine (CYMBALTA) 60 MG capsule Take 60 mg by mouth in the morning.    [provider]  ferrous sulfate 325 (65 FE) MG EC tablet Take 325 mg by mouth daily with breakfast.    [provider]  fluticasone (FLONASE) 50 MCG/ACT nasal spray Place 1 spray into both nostrils daily as needed for allergies or rhinitis.    [provider]  furosemide (LASIX) 40 MG tablet Take 1 tablet (40 mg total) by mouth daily. Two tabs twice daily 01/29/23   Lanae Boast, MD  glipiZIDE (GLUCOTROL) 10 MG tablet Take 1 tablet (10 mg total) by mouth 2 (two) times daily. 01/29/23   Lanae Boast, MD  guaiFENesin-codeine (ROBITUSSIN AC) 100-10 MG/5ML syrup Take 5 mLs by mouth 3 (three) times daily as needed for cough.    [provider]  hydrOXYzine (VISTARIL) 25 MG capsule Take 25 mg by mouth 3 (three) times daily as needed for anxiety.    [provider]  ipratropium-albuterol (DUONEB) 0.5-2.5 (3) MG/3ML SOLN Take 3 mLs by nebulization every 6 (six) hours as needed.    [provider]  lidocaine 4 % Place 1 patch onto the skin 2 (two) times daily as needed.    [provider]  montelukast (SINGULAIR) 10 MG tablet Take 10 mg by mouth daily.  [provider]  ondansetron (ZOFRAN) 4 MG tablet Take 4 mg by mouth every 6 (six) hours as needed for nausea or vomiting.    [provider]  pantoprazole (PROTONIX) 40 MG tablet Take 40 mg by mouth daily.    [provider]  sitaGLIPtin (JANUVIA) 50 MG tablet Take 50 mg by mouth daily.    [provider]  spironolactone (ALDACTONE) 100 MG tablet Take 200 mg by mouth 2 (two) times daily.    [provider]      Allergies    Latex    Review of Systems   Review of Systems  Respiratory:   Positive for shortness of breath.     Physical Exam Updated Vital Signs BP (!) 181/102   Pulse (!) 109   Temp 97.8 F (36.6 C) (Oral)   Resp (!) 24   Ht 5\' 6"  (1.676 m)   Wt 70.7 kg   SpO2 98%   BMI 25.16 kg/m  Physical Exam Constitutional:      General: Connor Vaughn is not in acute distress. Eyes:     Extraocular Movements: Extraocular movements intact.     Pupils: Pupils are equal, round, and reactive to light.  Cardiovascular:     Rate and Rhythm: Regular rhythm. Tachycardia present.  Pulmonary:     Effort: Pulmonary effort is normal. No respiratory distress.     Breath sounds: Examination of the right-upper field reveals decreased breath sounds. Examination of the right-middle field reveals decreased breath sounds. Examination of the right-lower field reveals decreased breath sounds. Decreased breath sounds present.  Abdominal:     Comments: Distended however no overlying skin color changes, tenderness or other peritoneal signs  Musculoskeletal:     Comments: 1+ bilateral pitting edema  Skin:    General: Skin is warm and dry.     Capillary Refill: Capillary refill takes less than 2 seconds.  Neurological:     Mental Status: Connor Vaughn is alert.  Psychiatric:        Mood and Affect: Mood normal.     ED Results / Procedures / Treatments   Labs (all labs ordered are listed, but only abnormal results are displayed) Labs Reviewed  CBC  PROTIME-INR  COMPREHENSIVE METABOLIC PANEL  LIPASE, BLOOD  URINALYSIS, ROUTINE W REFLEX MICROSCOPIC  BRAIN NATRIURETIC PEPTIDE  TROPONIN I (HIGH SENSITIVITY)    EKG None  Radiology No results found.  Procedures Ultrasound ED Abd  Date/Time: 04/12/2023 12:19 PM  Performed by: Netta Corrigan, PA-C Authorized by: Netta Corrigan, PA-C   Procedure details:    Indications: abdominal pain     Assessment for:  Intra-abdominal fluid   Aorta:  Unable to visualize   Left renal:  Not visualized   Right renal:  Not visualized    Hepatobiliary:  Not visualized   Bladder:  Not visualized    Images: archived    Comments:     Ascites present Ultrasound ED Thoracic  Date/Time: 04/12/2023 12:20 PM  Performed by: Netta Corrigan, PA-C Authorized by: Netta Corrigan, PA-C   Procedure details:    Indications: dyspnea     Assessment for:  Pleural effusion   Left lung pleural:  Visualized   Right lung pleural:  Visualized   Images: archived   Findings:    A-lines noted throughout: not identified     B-lines noted throughout: identified   Right Lung Findings:     no right lung consolidation    right lung pleural effusion  Left Lung Findings:     no left lung consolidation    no left lung pleural effusion Impression:    Impression: pulmonary edema   .Critical Care  Performed by: Netta Corrigan, PA-C Authorized by: Netta Corrigan, PA-C   Critical care provider statement:    Critical care time (minutes):  40   Critical care was necessary to treat or prevent imminent or life-threatening deterioration of the following conditions: Significant pleural effusion requiring thoracentesis.   Critical care was time spent personally by me on the following activities:  Blood draw for specimens, development of treatment plan with patient or surrogate, examination of patient, obtaining history from patient or surrogate, review of old charts, re-evaluation of patient's condition, pulse oximetry, ordering and review of radiographic studies, ordering and review of laboratory studies, ordering and performing treatments and interventions and evaluation of patient's response to treatment   I assumed direction of critical care for this patient from another provider in my specialty: no       Medications Ordered in ED Medications - No data to display  ED Course/ Medical Decision Making/ A&P                                 Medical Decision Making Amount and/or Complexity of Data Reviewed Labs: ordered. Radiology:  ordered.   BERK BASOM 62 y.o. presented today for shortness of breath.  Working DDx that I considered at this time includes, but not limited to, asthma/COPD exacerbation, URI, viral illness, anemia, ACS, PE, pneumonia, pleural effusion, lung cancer.  R/o DDx: pending  Review of prior external notes: 01/24/2023 ED provider notes  Unique Tests and My Interpretation:  CBC: Unremarkable CMP: Unremarkable Lipase: 127 BNP: 59.5 EKG: Sinus tachycardia 107 bpm, no ST elevations or depressions noted, similar to previous results Troponin: 4 CXR: Right pleural effusion that is large and almost takes of the entire lung CT chest abdomen pelvis contrast: Pending  Social Determinants of Health: EtOH/Substance Abuse  Discussion with Independent Historian:  Daughter  Discussion of Management of Tests: None  Risk: High: hospitalization or escalation of hospital-level care  Risk Stratification Score: none  Staffed with Tegeler, MD  Plan: On exam patient was in no acute distress was noted be slightly tachypneic and tachycardic.  On exam patient does have ascites along with pleural effusions with the bedside ultrasound.  Patient does have decreased breath sounds on the right side as well.  Chest x-ray does show right pleural effusion that takes him almost the entirety of patient's right lung.  I spoke to the patient Connor Vaughn stated that the abdominal distention is new but denies any associated symptoms besides shortness of breath.  Given the distention we will obtain labs along with imaging.  Do anticipate admission as patient states Connor Vaughn is never been this fluid overloaded before and that Connor Vaughn feels Connor Vaughn needs admission.  Patient signed out to [place], [place].  Please review their note for the continuation of patient's care.  The plan at this point is follow-up on labs and imaging and anticipate admission for patient's worsening pleural effusions and ascites as Connor Vaughn will likely need intervention.  This chart  was dictated using voice recognition software.  Despite best efforts to proofread,  errors can occur which can change the documentation meaning.         Final Clinical Impression(s) / ED Diagnoses Final diagnoses:  None    Rx / DC  Orders ED Discharge Orders     None         Remi Deter 04/12/23 [id number]    Tegeler, Canary Brim, MD 04/12/23 [phone]

## 2023-04-12 NOTE — H&P (Signed)
History and Physical   Connor Vaughn PPI:[id number] DOB: 1960/06/04 DOA: 04/12/2023  PCP: Ricky Stabs, NP-C   Patient coming from: Home  Chief Complaint: Shortness of breath, edema  HPI: Connor Vaughn is a 62 y.o. male with medical history significant of CKD 3A, diabetes, HCM status post ICD, NSVT, cirrhosis, hepatitis C, spinal stenosis with neurogenic, occasion presenting with shortness of breath and abdominal distention.  Patient presenting with recurrent shortness of breath and new abdominal distention.  Patient has known history of cirrhosis with recurrent hydrothorax and has undergone around 14 or 15 thoracenteses in the past.  He states he has never had a paracentesis.  Chart shows some history of ascites but never severe.  Patient follows up with GI in [place].  Additionally follows with interventional radiology for his recurrent thoracenteses. Has TIPS procedure planned by interventional radiology per chart review.  As above has had worsening shortness of breath and abdominal distention with shortness of breath being similar to previous and abdominal distention being new.  Denies fevers, chills, chest pain, abdominal pain, constipation, diarrhea, nausea, vomiting.  ED Course: Vital signs in the ED notable for blood pressure in the 130s to 180s systolic, heart rate in the 90s to 100s, respiratory rate in the teens to 20s.  Saturating okay on room air.  Does feel uncomfortable and short of breath and was not comfortable with the possibility of going home from the ED.  Lab workup included CMP with albumin 3.3, alk phos 161, glucose 315.  CBC with hemoglobin stable at 11.1, platelets stable at 112, mild leukopenia at 3.6.  Troponin normal.  BNP normal.  Lipase mildly elevated at 127.  Urinalysis with glucose, hemoglobin, ketones, protein.  Chest x-ray with recurrent right hydrothorax.  CT of the chest abdomen pelvis showed were large right pleural effusion, evidence of  cirrhosis with portal hypertension, large volume ascites, right inguinal hernia, CAD.  No intervention in the ED thus far, plan is for IR evaluation.  Review of Systems: As per HPI otherwise all other systems reviewed and are negative.  Past Medical History:  Diagnosis Date   Alcohol abuse 12/24/2015   Cirrhosis (HCC)    Diabetes mellitus, type II (HCC)    DKA (diabetic ketoacidoses) 04/04/2019   Esophageal varices with bleeding (HCC) 04/12/2023   H/O fracture    nasal x 2   Sleep apnea    UGIB (upper gastrointestinal bleed) 12/2019    Past Surgical History:  Procedure Laterality Date   ESOPHAGEAL BANDING  01/14/2020   Procedure: ESOPHAGEAL VARICES BANDING;  Surgeon: Jeani Hawking, MD;  Location: [facility];  Service: Endoscopy;;   ESOPHAGEAL BANDING  01/29/2023   Procedure: ESOPHAGEAL BANDING;  Surgeon: Kerin Salen, MD;  Location: [facility];  Service: Gastroenterology;;   ESOPHAGOGASTRODUODENOSCOPY N/A 01/13/2020   Procedure: ESOPHAGOGASTRODUODENOSCOPY (EGD);  Surgeon: Charna Elizabeth, MD;  Location: [facility];  Service: Endoscopy;  Laterality: N/A;   ESOPHAGOGASTRODUODENOSCOPY (EGD) WITH PROPOFOL N/A 04/05/2019   Procedure: ESOPHAGOGASTRODUODENOSCOPY (EGD) WITH PROPOFOL;  Surgeon: Jeani Hawking, MD;  Location: [facility];  Service: Endoscopy;  Laterality: N/A;   ESOPHAGOGASTRODUODENOSCOPY (EGD) WITH PROPOFOL N/A 01/14/2020   Procedure: ESOPHAGOGASTRODUODENOSCOPY (EGD) WITH PROPOFOL;  Surgeon: Jeani Hawking, MD;  Location: [facility];  Service: Endoscopy;  Laterality: N/A;   ESOPHAGOGASTRODUODENOSCOPY (EGD) WITH PROPOFOL N/A 01/29/2023   Procedure: ESOPHAGOGASTRODUODENOSCOPY (EGD) WITH PROPOFOL;  Surgeon: Kerin Salen, MD;  Location: [facility];  Service: Gastroenterology;  Laterality: N/A;   HERNIA REPAIR     Age 62  ICD IMPLANT N/A 05/18/2020   Procedure: ICD IMPLANT;  Surgeon: Marinus Maw, MD;  Location: [facility] INVASIVE CV LAB;  Service: Cardiovascular;  Laterality: N/A;    IR RADIOLOGIST EVAL & MGMT  03/31/2023   LUMBAR LAMINECTOMY/DECOMPRESSION MICRODISCECTOMY N/A 12/15/2019   Procedure: LUMBAR THREE-SACRAL ONE LAMINECTOMY WITH LUMBAR FOUR-FIVE DISCECTOMY ;  Surgeon: Bethann Goo, DO;  Location: [facility] OR;  Service: Neurosurgery;  Laterality: N/A;   thoracocenteis      Social History  reports that he quit smoking about 14 years ago. His smoking use included cigarettes. He has never used smokeless tobacco. He reports current alcohol use. He reports that he does not use drugs.  Allergies  Allergen Reactions   Latex Hives and Rash    Family History  Problem Relation Age of Onset   Diabetes Mother    Heart failure Mother    Valvular heart disease Mother    Diabetes Mellitus II Father    Heart failure Father    Dementia Father    Seizures Neg Hx   Reviewed on admission  Prior to Admission medications   Medication Sig Start Date End Date Taking? Authorizing Provider  busPIRone (BUSPAR) 5 MG tablet Take 5 mg by mouth 2 (two) times daily.   Yes [provider]  carboxymethylcellulose (REFRESH PLUS) 0.5 % SOLN Place 1 drop into both eyes 2 (two) times daily as needed (dry eyes).   Yes [provider]  carvedilol (COREG) 12.5 MG tablet Take 1.5 tablets (18.75 mg total) by mouth 2 (two) times daily. 12/29/20  Yes Graciella Freer, PA-C  dapagliflozin propanediol (FARXIGA) 10 MG TABS tablet Take 10 mg by mouth daily.   Yes [provider]  DULoxetine (CYMBALTA) 30 MG capsule Take 30 mg by mouth daily.   Yes [provider]  DULoxetine (CYMBALTA) 60 MG capsule Take 60 mg by mouth daily.   Yes [provider]  ferrous sulfate 325 (65 FE) MG EC tablet Take 325 mg by mouth daily with breakfast.   Yes [provider]  fexofenadine (ALLEGRA) 60 MG tablet Take 60 mg by mouth daily as needed for allergies.   Yes [provider]  fluticasone (FLONASE) 50 MCG/ACT nasal spray Place 1 spray into both nostrils  daily as needed for allergies or rhinitis.   Yes [provider]  furosemide (LASIX) 40 MG tablet Take 1 tablet (40 mg total) by mouth daily. Two tabs twice daily Patient taking differently: Take 40 mg by mouth 2 (two) times daily. 01/29/23  Yes Lanae Boast, MD  hydrOXYzine (VISTARIL) 25 MG capsule Take 25 mg by mouth 3 (three) times daily as needed for anxiety.   Yes [provider]  lidocaine 4 % Place 1 patch onto the skin 2 (two) times daily as needed (pain).   Yes [provider]  montelukast (SINGULAIR) 10 MG tablet Take 10 mg by mouth every evening.   Yes [provider]  pantoprazole (PROTONIX) 40 MG tablet Take 40 mg by mouth daily.   Yes [provider]  pregabalin (LYRICA) 150 MG capsule Take 150 mg by mouth 2 (two) times daily.   Yes [provider]  sitaGLIPtin (JANUVIA) 50 MG tablet Take 50 mg by mouth daily.   Yes [provider]  spironolactone (ALDACTONE) 100 MG tablet Take 200 mg by mouth 2 (two) times daily.   Yes [provider]  albuterol (VENTOLIN HFA) 108 (90 Base) MCG/ACT inhaler Inhale into the lungs every 6 (six) hours  as needed for wheezing or shortness of breath. Patient not taking: Reported on 04/12/2023    [provider]    Physical Exam: Vitals:   04/12/23 1715 04/12/23 1730 04/12/23 1745 04/12/23 1800  BP: (!) 165/91 (!) 142/92 (!) 164/97 (!) 138/91  Pulse:   (!) 104 (!) 103  Resp: 16 (!) 21 (!) 34 20  Temp:      TempSrc:      SpO2:   100% 92%  Weight:      Height:        Physical Exam Constitutional:      General: He is not in acute distress.    Appearance: Normal appearance.  HENT:     Head: Normocephalic and atraumatic.     Mouth/Throat:     Mouth: Mucous membranes are moist.     Pharynx: Oropharynx is clear.  Eyes:     Extraocular Movements: Extraocular movements intact.     Pupils: Pupils are equal, round, and reactive to light.  Cardiovascular:     Rate and  Rhythm: Normal rate and regular rhythm.     Pulses: Normal pulses.     Heart sounds: Normal heart sounds.  Pulmonary:     Effort: Pulmonary effort is normal. No respiratory distress.     Breath sounds: Examination of the right-upper field reveals decreased breath sounds. Examination of the right-middle field reveals decreased breath sounds. Examination of the right-lower field reveals decreased breath sounds. Decreased breath sounds present.  Abdominal:     General: Bowel sounds are normal. There is distension.     Tenderness: There is no abdominal tenderness.  Musculoskeletal:        General: No swelling or deformity.     Right lower leg: No edema.     Left lower leg: No edema.  Skin:    General: Skin is warm and dry.  Neurological:     General: No focal deficit present.     Mental Status: Mental status is at baseline.    Labs on Admission: I have personally reviewed following labs and imaging studies  CBC: Recent Labs  Lab 04/12/23 1220  WBC 3.6*  HGB 11.1*  HCT 39.3  MCV 69.3*  PLT 112*    Basic Metabolic Panel: Recent Labs  Lab 04/12/23 1220  NA 136  K 3.8  CL 103  CO2 25  GLUCOSE 315*  BUN 6  CREATININE 0.69  CALCIUM 8.9    GFR: Estimated Creatinine Clearance: 88.6 mL/min (by C-G formula based on SCr of 0.69 mg/dL).  Liver Function Tests: Recent Labs  Lab 04/12/23 1220  AST 41  ALT 26  ALKPHOS 161*  BILITOT 0.8  PROT 7.4  ALBUMIN 3.3*    Urine analysis:    Component Value Date/Time   COLORURINE YELLOW 04/12/2023 1440   APPEARANCEUR CLEAR 04/12/2023 1440   LABSPEC 1.042 (H) 04/12/2023 1440   PHURINE 6.0 04/12/2023 1440   GLUCOSEU >=500 (A) 04/12/2023 1440   HGBUR SMALL (A) 04/12/2023 1440   BILIRUBINUR NEGATIVE 04/12/2023 1440   KETONESUR 20 (A) 04/12/2023 1440   PROTEINUR 30 (A) 04/12/2023 1440   NITRITE NEGATIVE 04/12/2023 1440   LEUKOCYTESUR NEGATIVE 04/12/2023 1440    Radiological Exams on Admission: CT CHEST ABDOMEN PELVIS W  CONTRAST  Result Date: 04/12/2023 CLINICAL DATA:  Hepatic hydrothorax, recurrent pleural effusion cirrhosis, distended abdomen, groin and leg swelling EXAM: CT CHEST, ABDOMEN, AND PELVIS WITH CONTRAST TECHNIQUE: Multidetector CT imaging of the chest, abdomen and pelvis was performed following the  standard protocol during bolus administration of intravenous contrast. RADIATION DOSE REDUCTION: This exam was performed according to the departmental dose-optimization program which includes automated exposure control, adjustment of the mA and/or kV according to patient size and/or use of iterative reconstruction technique. CONTRAST:  75mL OMNIPAQUE IOHEXOL 350 MG/ML SOLN COMPARISON:  01/26/2023 FINDINGS: CT CHEST FINDINGS Cardiovascular: Left chest pacer defibrillator. Aortic valve calcifications. Scattered aortic atherosclerosis. Normal heart size. Left coronary artery calcifications. No pericardial effusion. Mediastinum/Nodes: No enlarged mediastinal, hilar, or axillary lymph nodes. Thyroid gland, trachea, and esophagus demonstrate no significant findings. Lungs/Pleura: Large right pleural effusion and associated atelectasis or consolidation. No left-sided pleural effusion. Left lung is normally aerated. Musculoskeletal: Bilateral gynecomastia.  No acute osseous findings. CT ABDOMEN PELVIS FINDINGS Hepatobiliary: Coarse, nodular cirrhotic morphology of the liver. No focal liver lesion. No gallstones, gallbladder wall thickening, or biliary dilatation. Pancreas: Calcifications within the pancreatic head, stigmata of chronic pancreatitis (series 3, image 70). No pancreatic ductal dilatation or surrounding inflammatory changes. Spleen: Splenomegaly, maximum coronal span 15.9 cm Adrenals/Urinary Tract: Adrenal glands are unremarkable. Kidneys are normal, without renal calculi, solid lesion, or hydronephrosis. Bladder is unremarkable. Stomach/Bowel: Stomach is within normal limits. Appendix appears normal. No evidence of  bowel wall thickening, distention, or inflammatory changes. Vascular/Lymphatic: Aortic atherosclerosis. Gastroesophageal varices (series 3, image 46). No enlarged abdominal or pelvic lymph nodes. Reproductive: No mass or other abnormality. Other: Anasarca. Large, fluid containing right inguinal hernia large volume ascites throughout the abdomen and pelvis, somewhat increased compared to prior examination. Musculoskeletal: No acute osseous findings. IMPRESSION: 1. Large right pleural effusion and associated atelectasis or consolidation. 2. Cirrhosis and portal hypertension. No focal liver lesion. 3. Large volume ascites throughout the abdomen and pelvis, somewhat increased compared to prior examination. Anasarca. 4. Large, fluid containing right inguinal hernia. 5. Coronary artery disease. Aortic Atherosclerosis (ICD10-I70.0). Electronically Signed   By: Jearld Lesch M.D.   On: 04/12/2023 17:33   DG Chest 2 View  Result Date: 04/12/2023 CLINICAL DATA:  Chest pain. EXAM: CHEST - 2 VIEW COMPARISON:  03/20/2023. FINDINGS: Since the prior study, there is opacification of the lower 3/4 of the right hemithorax. Findings may represent large pleural effusion and underlying compressive atelectatic changes of the right lung with or without associated consolidation. There is probable layering and more atelectasis in the right upper lung zone. Left lung and left costophrenic angle are clear. Evaluation of cardiomediastinal silhouette is nondiagnostic due to right lower hemithorax opacification. There is a left sided ICD device. No acute osseous abnormalities. The soft tissues are within normal limits. IMPRESSION: *Opacification of right hemithorax with sparing of the right upper lung zone, as described above. Electronically Signed   By: Jules Schick M.D.   On: 04/12/2023 15:41    EKG: Independently reviewed.  Sinus tachycardia at 107 bpm.  Nonspecific T wave flattening multiple leads.  Low voltage multiple leads.   Significant baseline wander multiple leads.  Significant baseline artifact V5.  Assessment/Plan Principal Problem:   Hydrothorax Active Problems:   Alcoholic cirrhosis of liver with ascites (HCC)   Recurrent right pleural effusion/hepatic hydrothorax   CKD stage 3a, GFR 45-59 ml/min (HCC)   DM (diabetes mellitus), type 2 with complications (HCC)   Hypertrophic cardiomyopathy (HCC)   ICD (implantable cardioverter-defibrillator) in place   Portal hypertension (HCC)   Cirrhosis Hydrothorax Ascites > Patient presenting with shortness of breath.  Known history of cirrhosis with recurrent hydrothorax.  Etiology of cirrhosis believed to be combination of alcohol use and chronic hepatitis C (  did complete treatment for hepatitis C as well) > Also reporting abdominal distention and noted to have severe ascites on imaging which is new. > Patient has had 14-15 thoracenteses for hydrothorax in the past but has never required paracentesis for ascites.  Follows with GI in [place] and interventional radiology. > Of note, recently seen by interventional radiology with plan for TIPS procedure. > As above, CT imaging today confirms large right pleural effusion and large volume ascites. > Lab workup largely stable in the ED.  Not currently requiring oxygen, but is subjectively short of breath and feels much more comfortable being admitted for observation and intervention. - Monitor on telemetry overnight - Consult to IR for eval and management of recurrent hydrothorax with thoracenteses and large volume ascites with paracentesis.  Currently has TIPS procedure planned outpatient, unclear if this may affect the timing of that. - Will continue with home Lasix, spironolactone - Continue home PPI  CKD 3A > Creatinine stable in the ED - Trend renal function and electrolytes  Diabetes - SSI  Spinal stenosis with neurogenic claudication - Continue home Lyrica  Depression Anxiety - Continue home  duloxetine, BuSpar, hydroxyzine  Hypertension - Continue Lasix, spironolactone, carvedilol  DVT prophylaxis: Lovenox Code Status:   DNR/DNI, confirmed with patient Family Communication:  None on admission Disposition Plan:   Patient is from:  Home  Anticipated DC to:  Home  Anticipated DC date:  1 to 3 days  Anticipated DC barriers: None  Consults called:  Order placed for interventional radiology evaluation Admission status:  Observation, telemetry  Severity of Illness: The appropriate patient status for this patient is OBSERVATION. Observation status is judged to be reasonable and necessary in order to provide the required intensity of service to ensure the patient's safety. The patient's presenting symptoms, physical exam findings, and initial radiographic and laboratory data in the context of their medical condition is felt to place them at decreased risk for further clinical deterioration. Furthermore, it is anticipated that the patient will be medically stable for discharge from the hospital within 2 midnights of admission.    Synetta Fail MD [facility]  How to contact the [facility] Attending or Consulting provider 7A - 7P or covering provider during after hours 7P -7A, for this patient?   Check the care team in [facility] and look for a) attending/consulting [facility] provider listed and b) the [facility] team listed Log into [email or web address] and use [place]'s universal password to access. If you do not have the password, please contact the hospital operator. Locate the [facility] provider you are looking for under [facility] and page to a number that you can be directly reached. If you still have difficulty reaching the provider, please page the [facility] (Director on Call) for the Hospitalists listed on amion for assistance.  04/12/2023, 6:27 PM

## 2023-04-12 NOTE — ED Triage Notes (Addendum)
Pt c/o SOB, groin swelling and leg swelling [id number]. Pt is tachypneic. Pt's abdomen is distended and very firm

## 2023-04-13 ENCOUNTER — Observation Stay (HOSPITAL_COMMUNITY): Payer: Medicare (Managed Care)

## 2023-04-13 ENCOUNTER — Encounter (HOSPITAL_COMMUNITY): Payer: Self-pay | Admitting: Internal Medicine

## 2023-04-13 DIAGNOSIS — E1122 Type 2 diabetes mellitus with diabetic chronic kidney disease: Secondary | ICD-10-CM | POA: Diagnosis present

## 2023-04-13 DIAGNOSIS — N319 Neuromuscular dysfunction of bladder, unspecified: Secondary | ICD-10-CM | POA: Diagnosis present

## 2023-04-13 DIAGNOSIS — M48062 Spinal stenosis, lumbar region with neurogenic claudication: Secondary | ICD-10-CM | POA: Diagnosis present

## 2023-04-13 DIAGNOSIS — J918 Pleural effusion in other conditions classified elsewhere: Secondary | ICD-10-CM | POA: Diagnosis present

## 2023-04-13 DIAGNOSIS — G4730 Sleep apnea, unspecified: Secondary | ICD-10-CM | POA: Diagnosis present

## 2023-04-13 DIAGNOSIS — K769 Liver disease, unspecified: Secondary | ICD-10-CM | POA: Diagnosis not present

## 2023-04-13 DIAGNOSIS — D61818 Other pancytopenia: Secondary | ICD-10-CM | POA: Diagnosis present

## 2023-04-13 DIAGNOSIS — Z66 Do not resuscitate: Secondary | ICD-10-CM | POA: Diagnosis present

## 2023-04-13 DIAGNOSIS — I350 Nonrheumatic aortic (valve) stenosis: Secondary | ICD-10-CM | POA: Diagnosis present

## 2023-04-13 DIAGNOSIS — Z01810 Encounter for preprocedural cardiovascular examination: Secondary | ICD-10-CM

## 2023-04-13 DIAGNOSIS — Z95810 Presence of automatic (implantable) cardiac defibrillator: Secondary | ICD-10-CM | POA: Diagnosis not present

## 2023-04-13 DIAGNOSIS — F32A Depression, unspecified: Secondary | ICD-10-CM | POA: Diagnosis present

## 2023-04-13 DIAGNOSIS — Z1152 Encounter for screening for COVID-19: Secondary | ICD-10-CM | POA: Diagnosis not present

## 2023-04-13 DIAGNOSIS — N1831 Chronic kidney disease, stage 3a: Secondary | ICD-10-CM | POA: Diagnosis present

## 2023-04-13 DIAGNOSIS — I129 Hypertensive chronic kidney disease with stage 1 through stage 4 chronic kidney disease, or unspecified chronic kidney disease: Secondary | ICD-10-CM | POA: Diagnosis present

## 2023-04-13 DIAGNOSIS — J948 Other specified pleural conditions: Secondary | ICD-10-CM | POA: Diagnosis present

## 2023-04-13 DIAGNOSIS — I8510 Secondary esophageal varices without bleeding: Secondary | ICD-10-CM | POA: Diagnosis present

## 2023-04-13 DIAGNOSIS — K766 Portal hypertension: Secondary | ICD-10-CM | POA: Diagnosis present

## 2023-04-13 DIAGNOSIS — K4090 Unilateral inguinal hernia, without obstruction or gangrene, not specified as recurrent: Secondary | ICD-10-CM | POA: Diagnosis present

## 2023-04-13 DIAGNOSIS — I2510 Atherosclerotic heart disease of native coronary artery without angina pectoris: Secondary | ICD-10-CM | POA: Diagnosis present

## 2023-04-13 DIAGNOSIS — F1090 Alcohol use, unspecified, uncomplicated: Secondary | ICD-10-CM | POA: Diagnosis present

## 2023-04-13 DIAGNOSIS — Z79899 Other long term (current) drug therapy: Secondary | ICD-10-CM | POA: Diagnosis not present

## 2023-04-13 DIAGNOSIS — K7031 Alcoholic cirrhosis of liver with ascites: Secondary | ICD-10-CM | POA: Diagnosis present

## 2023-04-13 DIAGNOSIS — I422 Other hypertrophic cardiomyopathy: Secondary | ICD-10-CM | POA: Diagnosis present

## 2023-04-13 DIAGNOSIS — Z7984 Long term (current) use of oral hypoglycemic drugs: Secondary | ICD-10-CM | POA: Diagnosis not present

## 2023-04-13 DIAGNOSIS — Z95828 Presence of other vascular implants and grafts: Secondary | ICD-10-CM | POA: Diagnosis not present

## 2023-04-13 DIAGNOSIS — F419 Anxiety disorder, unspecified: Secondary | ICD-10-CM | POA: Diagnosis present

## 2023-04-13 DIAGNOSIS — Z87891 Personal history of nicotine dependence: Secondary | ICD-10-CM | POA: Diagnosis not present

## 2023-04-13 HISTORY — PX: IR THORACENTESIS ASP PLEURAL SPACE W/IMG GUIDE: IMG5380

## 2023-04-13 LAB — GRAM STAIN

## 2023-04-13 LAB — ECHOCARDIOGRAM COMPLETE
AR max vel: 0.88 cm2
AV Area VTI: 0.85 cm2
AV Area mean vel: 0.79 cm2
AV Mean grad: 23.6 mm[Hg]
AV Peak grad: 41.9 mm[Hg]
Ao pk vel: 3.24 m/s
Area-P 1/2: 4.89 cm2
Calc EF: 59.6 %
Height: 66 in
MV VTI: 3.48 cm2
S' Lateral: 2.4 cm
Single Plane A2C EF: 65.6 %
Single Plane A4C EF: 53.6 %
Weight: 2493.84 [oz_av]

## 2023-04-13 LAB — COMPREHENSIVE METABOLIC PANEL
ALT: 22 U/L (ref 0–44)
ALT: 21 U/L (ref 0–44)
AST: 28 U/L (ref 15–41)
AST: 32 U/L (ref 15–41)
Albumin: 2.7 g/dL — ABNORMAL LOW (ref 3.5–5.0)
Albumin: 2.5 g/dL — ABNORMAL LOW (ref 3.5–5.0)
Alkaline Phosphatase: 127 U/L — ABNORMAL HIGH (ref 38–126)
Alkaline Phosphatase: 122 U/L (ref 38–126)
Anion gap: 4 — ABNORMAL LOW (ref 5–15)
Anion gap: 5 (ref 5–15)
BUN: 8 mg/dL (ref 6–20)
BUN: 12 mg/dL (ref 6–20)
CO2: 28 mmol/L (ref 22–32)
CO2: 28 mmol/L (ref 22–32)
Calcium: 8.3 mg/dL — ABNORMAL LOW (ref 8.9–10.3)
Calcium: 8.3 mg/dL — ABNORMAL LOW (ref 8.9–10.3)
Chloride: 104 mmol/L (ref 98–111)
Chloride: 100 mmol/L (ref 98–111)
Creatinine, Ser: 0.7 mg/dL (ref 0.61–1.24)
Creatinine, Ser: 0.87 mg/dL (ref 0.61–1.24)
GFR, Estimated: >60 mL/min (ref >60)
GFR, Estimated: >60 mL/min (ref >60)
Glucose, Bld: 229 mg/dL — ABNORMAL HIGH (ref 70–99)
Glucose, Bld: 431 mg/dL — ABNORMAL HIGH (ref 70–99)
Potassium: 3.9 mmol/L (ref 3.5–5.1)
Potassium: 4.6 mmol/L (ref 3.5–5.1)
Sodium: 136 mmol/L (ref 135–145)
Sodium: 133 mmol/L — ABNORMAL LOW (ref 135–145)
Total Bilirubin: 0.9 mg/dL (ref <1.2)
Total Bilirubin: 0.4 mg/dL (ref <1.2)
Total Protein: 5.9 g/dL — ABNORMAL LOW (ref 6.5–8.1)
Total Protein: 5.8 g/dL — ABNORMAL LOW (ref 6.5–8.1)

## 2023-04-13 LAB — CBC
HCT: 29.3 % — ABNORMAL LOW (ref 39.0–52.0)
Hemoglobin: 8.5 g/dL — ABNORMAL LOW (ref 13.0–17.0)
MCH: 20 pg — ABNORMAL LOW (ref 26.0–34.0)
MCHC: 29 g/dL — ABNORMAL LOW (ref 30.0–36.0)
MCV: 69.1 fL — ABNORMAL LOW (ref 80.0–100.0)
Platelets: 87 10*3/uL — ABNORMAL LOW (ref 150–400)
RBC: 4.24 MIL/uL (ref 4.22–5.81)
RDW: 19.8 % — ABNORMAL HIGH (ref 11.5–15.5)
WBC: 2.4 10*3/uL — ABNORMAL LOW (ref 4.0–10.5)
nRBC: 0 % (ref 0.0–0.2)

## 2023-04-13 LAB — PROTIME-INR
INR: 1.1 (ref 0.8–1.2)
Prothrombin Time: 14.7 s (ref 11.4–15.2)

## 2023-04-13 LAB — BODY FLUID CELL COUNT WITH DIFFERENTIAL
Eos, Fluid: 1 %
Lymphs, Fluid: 51 %
Monocyte-Macrophage-Serous Fluid: 43 % — ABNORMAL LOW (ref 50–90)
Neutrophil Count, Fluid: 2 % (ref 0–25)
Other Cells, Fluid: 3 %
Total Nucleated Cell Count, Fluid: 129 uL (ref 0–1000)

## 2023-04-13 LAB — ALBUMIN, PLEURAL OR PERITONEAL FLUID: Albumin, Fluid: <1.5 g/dL

## 2023-04-13 LAB — AMMONIA: Ammonia: 17 umol/L (ref 9–35)

## 2023-04-13 LAB — SARS CORONAVIRUS 2 BY RT PCR: SARS Coronavirus 2 by RT PCR: NEGATIVE

## 2023-04-13 LAB — PROTEIN, PLEURAL OR PERITONEAL FLUID: Total protein, fluid: <3 g/dL

## 2023-04-13 LAB — GLUCOSE, CAPILLARY: Glucose-Capillary: 298 mg/dL — ABNORMAL HIGH (ref 70–99)

## 2023-04-13 LAB — GLUCOSE, PLEURAL OR PERITONEAL FLUID: Glucose, Fluid: 299 mg/dL

## 2023-04-13 LAB — AMYLASE, PLEURAL OR PERITONEAL FLUID: Amylase, Fluid: 39 U/L

## 2023-04-13 MED ORDER — LIDOCAINE HCL 1 % IJ SOLN
INTRAMUSCULAR | Status: AC
  Filled 2023-04-13: qty 20

## 2023-04-13 MED ORDER — HYDROCODONE-ACETAMINOPHEN 5-325 MG PO TABS
1.0000 | ORAL_TABLET | Freq: Four times a day (QID) | ORAL | Status: DC | PRN
  Administered 2023-04-13 – 2023-04-19 (×13): 1 via ORAL
  Filled 2023-04-13 (×13): qty 1

## 2023-04-13 MED ORDER — LIDOCAINE 5 % EX PTCH
2.0000 | MEDICATED_PATCH | Freq: Two times a day (BID) | CUTANEOUS | Status: DC | PRN
  Administered 2023-04-14 – 2023-04-17 (×5): 2 via TRANSDERMAL
  Filled 2023-04-13 (×5): qty 2

## 2023-04-13 MED ORDER — LIDOCAINE HCL 1 % IJ SOLN
20.0000 mL | Freq: Once | INTRAMUSCULAR | Status: AC
  Administered 2023-04-13: 9 mL via INTRADERMAL

## 2023-04-13 MED ORDER — ALBUMIN HUMAN 25 % IV SOLN
50.0000 g | Freq: Once | INTRAVENOUS | Status: AC
  Administered 2023-04-13: 50 g via INTRAVENOUS
  Filled 2023-04-13: qty 200

## 2023-04-13 NOTE — Plan of Care (Signed)
Care Plan  ?

## 2023-04-13 NOTE — Progress Notes (Signed)
Progress Note   Patient: Connor Vaughn WNU:[id number] DOB: 30-Sep-1960 DOA: 04/12/2023     0 DOS: the patient was seen and examined on 04/13/2023   Brief hospital course: The patient is a 62 yr old man who presented to [facility] on 04/12/2023 with complaints of shortness of breath and abdominal distention. 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 He follows with interventional radiology for his recurrent thoracenteses. TIPS procedure has been planned.  He was found to have a large right pleural effusion and a large ascites. He was admitted to a telemetry bed. He underwent a thoracentesis on the morning of 04/13/2023. 2.4 liters were removed. Paracentesis is planned for 04/14/2023.   The patient feels better after thoracentesis. Abdomen is distended.  Assessment and Plan: Assessment/Plan Principal Problem:   Hydrothorax Active Problems:   Alcoholic cirrhosis of liver with ascites (HCC)   Recurrent right pleural effusion/hepatic hydrothorax   CKD stage 3a, GFR 45-59 ml/min (HCC)   DM (diabetes mellitus), type 2 with complications (HCC)   Hypertrophic cardiomyopathy (HCC)   ICD (implantable cardioverter-defibrillator) in place   Portal hypertension (HCC)   Cirrhosis Hydrothorax Ascites > Patient presenting with shortness of breath.  Known history of cirrhosis with recurrent hydrothorax.  Etiology of cirrhosis believed to be combination of alcohol use and chronic hepatitis C (did complete treatment for hepatitis C as well) > Also reporting abdominal distention and noted to have severe ascites on imaging which is new. > Patient has had 14-15 thoracenteses for hydrothorax in the past but has never required paracentesis for ascites.  Follows with GI in [place] and interventional radiology. > Of note, recently seen by interventional radiology with plan for TIPS  procedure. > As above, CT imaging today confirms large right pleural effusion and large volume ascites. > Lab workup largely stable in the ED.  Not currently requiring oxygen, but is subjectively short of breath and feels much more comfortable being admitted for observation and intervention. - Monitor on telemetry overnight - 2.4 L removed in thoracentesis on 04/13/2023. - Paracentesis planned for 04/13/2023. Will give 50 gm 25% albumin IV after the paracentesis. - Will continue with home Lasix, spironolactone - Continue home PPI   CKD 3A > Creatinine stable in the ED - Trend renal function and electrolytes   Diabetes - SSI   Spinal stenosis with neurogenic claudication - Continue home Lyrica   Depression Anxiety - Continue home duloxetine, BuSpar, hydroxyzine   Hypertension - Continue Lasix, spironolactone, carvedilol   DVT prophylaxis:      Lovenox Code Status:              DNR/DNI, confirmed with patient Family Communication:       None on admission Disposition Plan:              Patient is from:                        Home             Anticipated DC to:                   Home             Anticipated DC date:               1 to 3 days  Anticipated DC barriers:         None    Consults called:        Order placed for interventional radiology evaluation       Subjective: The patient is resting comfortably. He is anxious to get the fluid off of the abdomen.  Physical Exam: Vitals:   04/13/23 0845 04/13/23 1125 04/13/23 1307 04/13/23 1619  BP: (!) 144/99 114/84  100/66  Pulse: 91   84  Resp: 18   20  Temp: 97.6 F (36.4 C)   97.6 F (36.4 C)  TempSrc: Oral   Oral  SpO2: 100%  94% 95%  Weight:      Height:       Exam:  Constitutional:  The patient is awake, alert, and oriented x 3. No acute distress. Respiratory:  No increased work of breathing. No wheezes, rales, or rhonchi No tactile fremitus Cardiovascular:  Regular rate and rhythm No  murmurs, ectopy, or gallups. No lateral PMI. No thrills. Abdomen:  Abdomen is soft, distended, and diffusely tender No hernias, masses, or organomegaly Normoactive bowel sounds.  Musculoskeletal:  No cyanosis, clubbing, or edema Skin:  No rashes, lesions, ulcers palpation of skin: no induration or nodules Neurologic:  CN 2-12 intact Sensation all 4 extremities intact Psychiatric:  Mental status Mood, affect appropriate Orientation to person, place, time  judgment and insight appear intact  Family Communication: None available  Disposition: Status is: Inpatient Remains inpatient appropriate because: Patient with severe cirrhosis with severe ascites and pleural effusions.   Planned Discharge Destination: Home    Time spent: 34 minutes  Author: Taevon Aschoff, DO 04/13/2023 6:13 PM  For on call review [email or web address].

## 2023-04-13 NOTE — Progress Notes (Signed)
  Echocardiogram 2D Echocardiogram has been performed.  Ocie Doyne RDCS 04/13/2023, 2:05 PM

## 2023-04-13 NOTE — Inpatient Diabetes Management (Signed)
Inpatient Diabetes Program Recommendations  AACE/ADA: New Consensus Statement on Inpatient Glycemic Control (2015)  Target Ranges:  Prepandial:   less than 140 mg/dL      Peak postprandial:   less than 180 mg/dL (1-2 hours)      Critically ill patients:  140 - 180 mg/dL   Lab Results  Component Value Date   GLUCAP 232 (H) 01/29/2023   HGBA1C 10.0 (H) 01/25/2023    Review of Glycemic Control  Latest Reference Range & Units 04/12/23 12:20 04/13/23 04:19  Glucose 70 - 99 mg/dL 161 (H) 096 (H)   Diabetes history: DM 2 Outpatient Diabetes medications: farxiga 10 mg Daily, Januvia 50 mg Daily Current orders for Inpatient glycemic control:  None  A1c 10% on 9/4 Diabetes Coordinator spoke with pt at that time  Inpatient Diabetes Program Recommendations:    -   Start Semglee 10 units -   Novolog 0-9 units tid + hs  Thanks,  Christena Deem RN, MSN, BC-ADM Inpatient Diabetes Coordinator Team Pager [phone] (8a-5p)

## 2023-04-13 NOTE — Procedures (Signed)
PROCEDURE SUMMARY:  Successful US guided right thoracentesis. Yielded 2.4 liters of yellow fluid. Pt tolerated procedure well. No immediate complications.  Specimen was sent for labs. CXR ordered.  EBL < 5 mL  Hoyt Koch PA-C 04/13/2023 1:30 PM

## 2023-04-13 NOTE — Plan of Care (Signed)

## 2023-04-13 NOTE — Progress Notes (Signed)
Patient being taken down to IR via transport at this time.  No s/s of distress noted.

## 2023-04-13 NOTE — Consult Note (Addendum)
Chief Complaint: Patient was seen in consultation today for hepatic hydrothorax  Referring Physician(s): Dr. Lanae Boast  Supervising Physician: Richarda Overlie  Patient Status: [facility] - In-pt  History of Present Illness: Connor Vaughn is a 62 y.o. male with past medical history of poorly controlled DM, sleep apnea, back pain s/p spinal fusion, alcoholic cirrhosis with ongoing EtOH use, and esophageal varices s/p banding ~2 years recently admitted to [facility] with recurrent hydrothorax.  During that admission he did begin work-up for possible TIPS procedure, however it was felt that he could be further medically maximized and TIPS was deferred.  Patient has since required frequent large volume thoracentesis.  He was seen in consultation with Dr. Roanna Banning 03/31/23 with intent to schedule for TIPS next week, however became quickly symptomatic of recurrent hydrothorax and is now admitted with shortness of breath.    IR consulted for reconsideration of TIPS procedure as an inpatient.  Case reviewed and approved by Dr. Milford Cage and Dr. Lowella Dandy.  Past Medical History:  Diagnosis Date   Alcohol abuse 12/24/2015   Cirrhosis (HCC)    Diabetes mellitus, type II (HCC)    DKA (diabetic ketoacidoses) 04/04/2019   Esophageal varices with bleeding (HCC) 04/12/2023   H/O fracture    nasal x 2   Sleep apnea    UGIB (upper gastrointestinal bleed) 12/2019    Past Surgical History:  Procedure Laterality Date   ESOPHAGEAL BANDING  01/14/2020   Procedure: ESOPHAGEAL VARICES BANDING;  Surgeon: Jeani Hawking, MD;  Location: [facility];  Service: Endoscopy;;   ESOPHAGEAL BANDING  01/29/2023   Procedure: ESOPHAGEAL BANDING;  Surgeon: Kerin Salen, MD;  Location: [facility];  Service: Gastroenterology;;   ESOPHAGOGASTRODUODENOSCOPY N/A 01/13/2020   Procedure: ESOPHAGOGASTRODUODENOSCOPY (EGD);  Surgeon: Charna Elizabeth, MD;  Location: [facility];  Service: Endoscopy;  Laterality: N/A;   ESOPHAGOGASTRODUODENOSCOPY  (EGD) WITH PROPOFOL N/A 04/05/2019   Procedure: ESOPHAGOGASTRODUODENOSCOPY (EGD) WITH PROPOFOL;  Surgeon: Jeani Hawking, MD;  Location: [facility];  Service: Endoscopy;  Laterality: N/A;   ESOPHAGOGASTRODUODENOSCOPY (EGD) WITH PROPOFOL N/A 01/14/2020   Procedure: ESOPHAGOGASTRODUODENOSCOPY (EGD) WITH PROPOFOL;  Surgeon: Jeani Hawking, MD;  Location: [facility];  Service: Endoscopy;  Laterality: N/A;   ESOPHAGOGASTRODUODENOSCOPY (EGD) WITH PROPOFOL N/A 01/29/2023   Procedure: ESOPHAGOGASTRODUODENOSCOPY (EGD) WITH PROPOFOL;  Surgeon: Kerin Salen, MD;  Location: [facility];  Service: Gastroenterology;  Laterality: N/A;   HERNIA REPAIR     Age 44   ICD IMPLANT N/A 05/18/2020   Procedure: ICD IMPLANT;  Surgeon: Marinus Maw, MD;  Location: [facility] INVASIVE CV LAB;  Service: Cardiovascular;  Laterality: N/A;   IR RADIOLOGIST EVAL & MGMT  03/31/2023   LUMBAR LAMINECTOMY/DECOMPRESSION MICRODISCECTOMY N/A 12/15/2019   Procedure: LUMBAR THREE-SACRAL ONE LAMINECTOMY WITH LUMBAR FOUR-FIVE DISCECTOMY ;  Surgeon: Bethann Goo, DO;  Location: [facility] OR;  Service: Neurosurgery;  Laterality: N/A;   thoracocenteis      Allergies: Latex  Medications: Prior to Admission medications   Medication Sig Start Date End Date Taking? Authorizing Provider  albuterol (VENTOLIN HFA) 108 (90 Base) MCG/ACT inhaler Inhale into the lungs every 6 (six) hours as needed for wheezing or shortness of breath.   Yes [provider]  benzonatate (TESSALON) 100 MG capsule Take 100 mg by mouth 3 (three) times daily as needed for cough.   Yes [provider]  busPIRone (BUSPAR) 5 MG tablet Take 5 mg by mouth 2 (two) times daily.   Yes [provider]  carboxymethylcellulose (REFRESH PLUS) 0.5 % SOLN Place  1 drop into both eyes 2 (two) times daily as needed.   Yes [provider]  carvedilol (COREG) 12.5 MG tablet Take 1.5 tablets (18.75 mg total) by mouth 2 (two) times daily. Patient taking differently:  Take 12.5 mg by mouth 2 (two) times daily. 12/29/20  Yes Graciella Freer, PA-C  dapagliflozin propanediol (FARXIGA) 10 MG TABS tablet Take 10 mg by mouth daily.   Yes [provider]  DULoxetine (CYMBALTA) 30 MG capsule Take 30 mg by mouth every evening.   Yes [provider]  DULoxetine (CYMBALTA) 60 MG capsule Take 60 mg by mouth in the morning.   Yes [provider]  ferrous sulfate 325 (65 FE) MG EC tablet Take 325 mg by mouth daily with breakfast.   Yes [provider]  fluticasone (FLONASE) 50 MCG/ACT nasal spray Place 1 spray into both nostrils daily as needed for allergies or rhinitis.   Yes [provider]  furosemide (LASIX) 80 MG tablet Take 80 mg by mouth 2 (two) times daily.   Yes [provider]  guaiFENesin-codeine (ROBITUSSIN AC) 100-10 MG/5ML syrup Take 5 mLs by mouth 3 (three) times daily as needed for cough.   Yes [provider]  hydrOXYzine (VISTARIL) 25 MG capsule Take 25 mg by mouth 3 (three) times daily as needed for anxiety.   Yes [provider]  ipratropium-albuterol (DUONEB) 0.5-2.5 (3) MG/3ML SOLN Take 3 mLs by nebulization every 6 (six) hours as needed.   Yes [provider]  lidocaine 4 % Place 1 patch onto the skin 2 (two) times daily as needed.   Yes [provider]  montelukast (SINGULAIR) 10 MG tablet Take 10 mg by mouth daily.   Yes [provider]  ondansetron (ZOFRAN) 4 MG tablet Take 4 mg by mouth every 6 (six) hours as needed for nausea or vomiting.   Yes [provider]  pantoprazole (PROTONIX) 40 MG tablet Take 40 mg by mouth daily.   Yes [provider]  sitaGLIPtin (JANUVIA) 50 MG tablet Take 50 mg by mouth daily.   Yes [provider]  spironolactone (ALDACTONE) 100 MG tablet Take 200 mg by mouth 2 (two) times daily.   Yes [provider]     Family History  Problem Relation Age of Onset   Diabetes Mother     Heart failure Mother    Valvular heart disease Mother    Diabetes Mellitus II Father    Heart failure Father    Dementia Father    Seizures Neg Hx     Social History   Socioeconomic History   Marital status: Married    Spouse name: Not on file   Number of children: 2   Years of education: Not on file   Highest education level: High school graduate  Occupational History   Not on file  Tobacco Use   Smoking status: Former    Current packs/day: 0.00    Types: Cigarettes    Quit date: 2010    Years since quitting: 14.8   Smokeless tobacco: Never  Vaping Use   Vaping status: Never Used  Substance and Sexual Activity   Alcohol use: Yes    Comment: occ   Drug use: No   Sexual activity: Yes  Other Topics Concern   Not on file  Social History Narrative   Lives at home with his wife   Right handed   No caffeine   Social Determinants of Corporate investment banker  Strain: Not on file  Food Insecurity: No Food Insecurity (04/13/2023)   Hunger Vital Sign    Worried About Running Out of Food in the Last Year: Never true    Ran Out of Food in the Last Year: Never true  Transportation Needs: No Transportation Needs (04/13/2023)   PRAPARE - Administrator, Civil Service (Medical): No    Lack of Transportation (Non-Medical): No  Physical Activity: Not on file  Stress: Not on file  Social Connections: Not on file     Review of Systems: A 12 point ROS discussed and pertinent positives are indicated in the HPI above.  All other systems are negative.  Review of Systems  Constitutional:  Negative for fatigue and fever.  Respiratory:  Positive for cough and shortness of breath.   Cardiovascular:  Negative for chest pain.  Gastrointestinal:  Negative for abdominal pain, nausea and vomiting.  Genitourinary:  Positive for scrotal swelling.  Musculoskeletal:  Negative for back pain.  Psychiatric/Behavioral:  Negative for behavioral problems and confusion.     Vital  Signs: BP 114/84 (BP Location: Left Arm)   Pulse 91   Temp 97.6 F (36.4 C) (Oral)   Resp 18   Ht 5\' 6"  (1.676 m)   Wt 155 lb 13.8 oz (70.7 kg)   SpO2 100%   BMI 25.16 kg/m   Physical Exam Vitals and nursing note reviewed.  Constitutional:      General: He is not in acute distress.    Appearance: He is well-developed. He is not ill-appearing.  Cardiovascular:     Rate and Rhythm: Normal rate and regular rhythm.  Pulmonary:     Effort: Pulmonary effort is normal.     Breath sounds: Examination of the right-upper field reveals decreased breath sounds. Examination of the right-middle field reveals decreased breath sounds. Examination of the right-lower field reveals decreased breath sounds. Decreased breath sounds present.  Skin:    General: Skin is warm and dry.  Neurological:     General: No focal deficit present.     Mental Status: He is alert and oriented to person, place, and time.  Psychiatric:        Mood and Affect: Mood normal.        Behavior: Behavior normal.     Imaging: CT CHEST ABDOMEN PELVIS W CONTRAST  Result Date: 04/12/2023 CLINICAL DATA:  Hepatic hydrothorax, recurrent pleural effusion cirrhosis, distended abdomen, groin and leg swelling EXAM: CT CHEST, ABDOMEN, AND PELVIS WITH CONTRAST TECHNIQUE: Multidetector CT imaging of the chest, abdomen and pelvis was performed following the standard protocol during bolus administration of intravenous contrast. RADIATION DOSE REDUCTION: This exam was performed according to the departmental dose-optimization program which includes automated exposure control, adjustment of the mA and/or kV according to patient size and/or use of iterative reconstruction technique. CONTRAST:  75mL OMNIPAQUE IOHEXOL 350 MG/ML SOLN COMPARISON:  01/26/2023 FINDINGS: CT CHEST FINDINGS Cardiovascular: Left chest pacer defibrillator. Aortic valve calcifications. Scattered aortic atherosclerosis. Normal heart size. Left coronary artery  calcifications. No pericardial effusion. Mediastinum/Nodes: No enlarged mediastinal, hilar, or axillary lymph nodes. Thyroid gland, trachea, and esophagus demonstrate no significant findings. Lungs/Pleura: Large right pleural effusion and associated atelectasis or consolidation. No left-sided pleural effusion. Left lung is normally aerated. Musculoskeletal: Bilateral gynecomastia.  No acute osseous findings. CT ABDOMEN PELVIS FINDINGS Hepatobiliary: Coarse, nodular cirrhotic morphology of the liver. No focal liver lesion. No gallstones, gallbladder wall thickening, or biliary dilatation. Pancreas: Calcifications within the pancreatic head, stigmata of chronic  pancreatitis (series 3, image 70). No pancreatic ductal dilatation or surrounding inflammatory changes. Spleen: Splenomegaly, maximum coronal span 15.9 cm Adrenals/Urinary Tract: Adrenal glands are unremarkable. Kidneys are normal, without renal calculi, solid lesion, or hydronephrosis. Bladder is unremarkable. Stomach/Bowel: Stomach is within normal limits. Appendix appears normal. No evidence of bowel wall thickening, distention, or inflammatory changes. Vascular/Lymphatic: Aortic atherosclerosis. Gastroesophageal varices (series 3, image 46). No enlarged abdominal or pelvic lymph nodes. Reproductive: No mass or other abnormality. Other: Anasarca. Large, fluid containing right inguinal hernia large volume ascites throughout the abdomen and pelvis, somewhat increased compared to prior examination. Musculoskeletal: No acute osseous findings. IMPRESSION: 1. Large right pleural effusion and associated atelectasis or consolidation. 2. Cirrhosis and portal hypertension. No focal liver lesion. 3. Large volume ascites throughout the abdomen and pelvis, somewhat increased compared to prior examination. Anasarca. 4. Large, fluid containing right inguinal hernia. 5. Coronary artery disease. Aortic Atherosclerosis (ICD10-I70.0). Electronically Signed   By: Jearld Lesch  M.D.   On: 04/12/2023 17:33   DG Chest 2 View  Result Date: 04/12/2023 CLINICAL DATA:  Chest pain. EXAM: CHEST - 2 VIEW COMPARISON:  03/20/2023. FINDINGS: Since the prior study, there is opacification of the lower 3/4 of the right hemithorax. Findings may represent large pleural effusion and underlying compressive atelectatic changes of the right lung with or without associated consolidation. There is probable layering and more atelectasis in the right upper lung zone. Left lung and left costophrenic angle are clear. Evaluation of cardiomediastinal silhouette is nondiagnostic due to right lower hemithorax opacification. There is a left sided ICD device. No acute osseous abnormalities. The soft tissues are within normal limits. IMPRESSION: *Opacification of right hemithorax with sparing of the right upper lung zone, as described above. Electronically Signed   By: Jules Schick M.D.   On: 04/12/2023 15:41   IR Radiologist Eval & Mgmt  Result Date: 03/31/2023 EXAM: NEW PATIENT OFFICE VISIT CHIEF COMPLAINT: See below HISTORY OF PRESENT ILLNESS: See below REVIEW OF SYSTEMS: See below PHYSICAL EXAMINATION: See below ASSESSMENT AND PLAN: Please refer to completed note in the electronic medical record on [facility] Roanna Banning, MD [facility] Radiology Electronically Signed   By: Roanna Banning M.D.   On: 03/31/2023 16:45   CUP PACEART INCLINIC DEVICE CHECK  Result Date: 03/21/2023 ICD check in clinic. Normal device function. Thresholds and sensing consistent with previous device measurements. Impedance trends stable over time. No mode switches. No ventricular arrhythmias. Histograms with R shift chronically. No changes made this session. Device programmed at appropriate safety margins. Estimated longevity 100%.  Pt enrolled in remote follow-up. Patient education completed See attached   Labs:  CBC: Recent Labs    01/28/23 0501 01/29/23 0456  04/12/23 1220 04/13/23 0419  WBC 2.5* 3.3* 3.6* 2.4*  HGB 7.9* 8.3* 11.1* 8.5*  HCT 27.0* 27.9* 39.3 29.3*  PLT 97* 107* 112* 87*    COAGS: Recent Labs    01/26/23 1001 04/12/23 1220 04/13/23 0419  INR 1.1 1.0 1.1    BMP: Recent Labs    01/28/23 0501 01/29/23 0456 04/12/23 1220 04/13/23 0419  NA 134* 136 136 136  K 3.9 3.9 3.8 3.9  CL 102 104 103 104  CO2 26 22 25 28   GLUCOSE 98 216* 315* 229*  BUN 12 15 6 8   CALCIUM 8.2* 8.6* 8.9 8.3*  CREATININE 0.64 0.89 0.69 0.70  GFRNONAA >60 >60 >60 >60    LIVER FUNCTION TESTS: Recent Labs  01/24/23 1917 01/25/23 0512 04/12/23 1220 04/13/23 0419  BILITOT 0.6 0.9 0.8 0.9  AST 35 25 41 28  ALT 24 18 26 22   ALKPHOS 127* 94 161* 127*  PROT 6.6 5.8* 7.4 5.9*  ALBUMIN 3.0* 3.0* 3.3* 2.7*    TUMOR MARKERS: No results for input(s): "AFPTM", "CEA", "CA199", "CHROMGRNA" in the last 8760 hours.  Assessment and Plan: Recurrent hydrothorax Alcoholic cirrhosis Patient admitted with recurrent hydrothorax.  He was recently seen in consultation by Dr. Milford Cage working toward outpatient TIPS procedure, however has again been readmitted with rapidly reaccumulating pleural fluid.  Patient would benefit from inpatient management with anticipated TIPS with anesthesia 11/25. This was discussed with patient who is agreeable. He did undergo thoracentesis today and was able to withdrawal 2.4 liters before discomfort prevented further fluid withdrawal. He also questions whether he may need a paracentesis.  IR available to performing assuming he has amenable fluid and this warranted by medical team.   Na 136 INR 1.1 Tbili 0.9 Scr 0.7  Plts: 87  MELD score: 9 FIPS score: -0.55 Child Pugh Class B  Patient clinically stable.  Fluid management ongoing. Thus far, TIPS-related labs favorable. Patient agreeable.   Risks and benefits of TIPS, BRTO and/or additional variceal embolization were discussed with the patient and/or the  patient's family including, but not limited to, infection, bleeding, damage to adjacent structures, worsening hepatic and/or cardiac function, worsening and/or the development of altered mental status/encephalopathy, non-target embolization and death.   This interventional procedure involves the use of X-rays and because of the nature of the planned procedure, it is possible that we will have prolonged use of X-ray fluoroscopy.  Potential radiation risks to you include (but are not limited to) the following: - A slightly elevated risk for cancer  several years later in life. This risk is typically less than 0.5% percent. This risk is low in comparison to the normal incidence of human cancer, which is 33% for women and 50% for men according to the American Cancer Society. - Radiation induced injury can include skin redness, resembling a rash, tissue breakdown / ulcers and hair loss (which can be temporary or permanent).   The likelihood of either of these occurring depends on the difficulty of the procedure and whether you are sensitive to radiation due to previous procedures, disease, or genetic conditions.   IF your procedure requires a prolonged use of radiation, you will be notified and given written instructions for further action.  It is your responsibility to monitor the irradiated area for the 2 weeks following the procedure and to notify your physician if you are concerned that you have suffered a radiation induced injury.    All of the patient's questions were answered, patient is agreeable to proceed.  Consent signed and in chart.  NPO p MN Sunday.    Thank you for this interesting consult.  I greatly enjoyed meeting Connor Vaughn and look forward to participating in their care.  A copy of this report was sent to the requesting provider on this date.  Electronically Signed: Hoyt Koch, PA 04/13/2023, 11:44 AM   I spent a total of 40 Minutes    in face to face  in clinical consultation, greater than 50% of which was counseling/coordinating care for recurrent hydrothorax.

## 2023-04-13 NOTE — TOC CM/SW Note (Signed)
Transition of Care [facility]) - Inpatient Brief Assessment   Patient Details  Name: Connor Vaughn MRN: [id number] Date of Birth: [birth year]  Transition of Care [facility]) CM/SW Contact:    Tom-Johnson, Hershal Coria, RN Phone Number: 04/13/2023, 11:06 AM   Clinical Narrative:  Patient presented to the ED with Shortness Of Breath, Abdominal Distention, Groin and Bilateral Leg Swelling.  Patient has hx of Ascites, CKD 3A, Hepatitis C, HCM s/p ICD, Cirrhosis with recurrent Hydrothorax, has undergone 14-15 Thoracenteses in the past. Patient f/u with GI in [place].  IR to evaluate.  Currently on 2L O2 acute. Has a home concentrator that he uses as needed.   From home with wife, has one supportive daughter. Not employed. Enrolled with [employer] [facility] [employer].  [facility] [place]. [facility] trans[facility] to and from appointments. Has a cane, walker, shower seats and grab bars at home.  PCP is Ricky Stabs, NP-C and uses [facility].   No TOC needs or recommendations noted at this time.  Patient not Medically ready for discharge.  CM will continue to follow as patient progresses with care towards discharge.                   Transition of Care Asessment: Insurance and Status: Insurance coverage has been reviewed Patient has primary care physician: Yes Home environment has been reviewed: Yes Prior level of function:: Modified Independent Prior/Current Home Services: Current home services ([employer] [facility]) Social Determinants of Health Reivew: SDOH reviewed no interventions necessary Readmission risk has been reviewed: Yes Transition of care needs: no transition of care needs at this time

## 2023-04-14 ENCOUNTER — Inpatient Hospital Stay (HOSPITAL_COMMUNITY): Payer: Medicare (Managed Care)

## 2023-04-14 DIAGNOSIS — J948 Other specified pleural conditions: Secondary | ICD-10-CM | POA: Diagnosis not present

## 2023-04-14 HISTORY — PX: IR PARACENTESIS: IMG2679

## 2023-04-14 LAB — COMPREHENSIVE METABOLIC PANEL
ALT: 18 U/L (ref 0–44)
AST: 23 U/L (ref 15–41)
Albumin: 2.9 g/dL — ABNORMAL LOW (ref 3.5–5.0)
Alkaline Phosphatase: 98 U/L (ref 38–126)
Anion gap: 5 (ref 5–15)
BUN: 11 mg/dL (ref 6–20)
CO2: 25 mmol/L (ref 22–32)
Calcium: 8.3 mg/dL — ABNORMAL LOW (ref 8.9–10.3)
Chloride: 102 mmol/L (ref 98–111)
Creatinine, Ser: 0.72 mg/dL (ref 0.61–1.24)
GFR, Estimated: >60 mL/min (ref >60)
Glucose, Bld: 221 mg/dL — ABNORMAL HIGH (ref 70–99)
Potassium: 3.5 mmol/L (ref 3.5–5.1)
Sodium: 132 mmol/L — ABNORMAL LOW (ref 135–145)
Total Bilirubin: 1 mg/dL (ref <1.2)
Total Protein: 5.5 g/dL — ABNORMAL LOW (ref 6.5–8.1)

## 2023-04-14 LAB — BODY FLUID CELL COUNT WITH DIFFERENTIAL
Eos, Fluid: 0 %
Lymphs, Fluid: 47 %
Monocyte-Macrophage-Serous Fluid: 38 % — ABNORMAL LOW (ref 50–90)
Neutrophil Count, Fluid: 15 % (ref 0–25)
Total Nucleated Cell Count, Fluid: 119 uL (ref 0–1000)

## 2023-04-14 LAB — CBC WITH DIFFERENTIAL/PLATELET
Abs Immature Granulocytes: 0.01 10*3/uL (ref 0.00–0.07)
Basophils Absolute: 0 10*3/uL (ref 0.0–0.1)
Basophils Relative: 1 %
Eosinophils Absolute: 0.2 10*3/uL (ref 0.0–0.5)
Eosinophils Relative: 8 %
HCT: 25.8 % — ABNORMAL LOW (ref 39.0–52.0)
Hemoglobin: 7.6 g/dL — ABNORMAL LOW (ref 13.0–17.0)
Immature Granulocytes: 1 %
Lymphocytes Relative: 23 %
Lymphs Abs: 0.5 10*3/uL — ABNORMAL LOW (ref 0.7–4.0)
MCH: 19.9 pg — ABNORMAL LOW (ref 26.0–34.0)
MCHC: 29.5 g/dL — ABNORMAL LOW (ref 30.0–36.0)
MCV: 67.7 fL — ABNORMAL LOW (ref 80.0–100.0)
Monocytes Absolute: 0.3 10*3/uL (ref 0.1–1.0)
Monocytes Relative: 13 %
Neutro Abs: 1.2 10*3/uL — ABNORMAL LOW (ref 1.7–7.7)
Neutrophils Relative %: 54 %
Platelets: 76 10*3/uL — ABNORMAL LOW (ref 150–400)
RBC: 3.81 MIL/uL — ABNORMAL LOW (ref 4.22–5.81)
RDW: 19.3 % — ABNORMAL HIGH (ref 11.5–15.5)
WBC: 2.1 10*3/uL — ABNORMAL LOW (ref 4.0–10.5)
nRBC: 0 % (ref 0.0–0.2)

## 2023-04-14 LAB — CYTOLOGY - NON PAP

## 2023-04-14 LAB — ALBUMIN, PLEURAL OR PERITONEAL FLUID: Albumin, Fluid: <1.5 g/dL

## 2023-04-14 LAB — GRAM STAIN

## 2023-04-14 LAB — PROTEIN, PLEURAL OR PERITONEAL FLUID: Total protein, fluid: <3 g/dL

## 2023-04-14 LAB — GLUCOSE, CAPILLARY
Glucose-Capillary: 219 mg/dL — ABNORMAL HIGH (ref 70–99)
Glucose-Capillary: 218 mg/dL — ABNORMAL HIGH (ref 70–99)
Glucose-Capillary: 328 mg/dL — ABNORMAL HIGH (ref 70–99)
Glucose-Capillary: 207 mg/dL — ABNORMAL HIGH (ref 70–99)

## 2023-04-14 MED ORDER — SODIUM CHLORIDE 0.9 % IV SOLN
2.0000 g | INTRAVENOUS | Status: AC
  Administered 2023-04-17: 2 g via INTRAVENOUS
  Filled 2023-04-14: qty 20

## 2023-04-14 MED ORDER — INSULIN DETEMIR 100 UNIT/ML ~~LOC~~ SOLN
15.0000 [IU] | Freq: Every day | SUBCUTANEOUS | Status: DC
  Administered 2023-04-14: 15 [IU] via SUBCUTANEOUS
  Filled 2023-04-14 (×2): qty 0.15

## 2023-04-14 MED ORDER — INSULIN ASPART 100 UNIT/ML IJ SOLN: 0.0000 [IU] | Freq: Three times a day (TID) | INTRAMUSCULAR | Status: DC

## 2023-04-14 MED ORDER — INSULIN ASPART 100 UNIT/ML IJ SOLN
0.0000 [IU] | Freq: Three times a day (TID) | INTRAMUSCULAR | Status: DC
  Administered 2023-04-14: 11 [IU] via SUBCUTANEOUS
  Administered 2023-04-15: 3 [IU] via SUBCUTANEOUS
  Administered 2023-04-15: 2 [IU] via SUBCUTANEOUS
  Administered 2023-04-15: 3 [IU] via SUBCUTANEOUS
  Administered 2023-04-16: 2 [IU] via SUBCUTANEOUS
  Administered 2023-04-16 – 2023-04-17 (×2): 3 [IU] via SUBCUTANEOUS
  Administered 2023-04-18: 5 [IU] via SUBCUTANEOUS
  Administered 2023-04-18: 11 [IU] via SUBCUTANEOUS
  Administered 2023-04-18 – 2023-04-19 (×2): 5 [IU] via SUBCUTANEOUS

## 2023-04-14 MED ORDER — LIDOCAINE HCL 1 % IJ SOLN
INTRAMUSCULAR | Status: AC
  Filled 2023-04-14: qty 20

## 2023-04-14 NOTE — Progress Notes (Signed)
Blood glucose 328, MD made aware  Connor Vaughn

## 2023-04-14 NOTE — Inpatient Diabetes Management (Signed)
Inpatient Diabetes Program Recommendations  AACE/ADA: New Consensus Statement on Inpatient Glycemic Control (2015)  Target Ranges:  Prepandial:   less than 140 mg/dL      Peak postprandial:   less than 180 mg/dL (1-2 hours)      Critically ill patients:  140 - 180 mg/dL   Lab Results  Component Value Date   GLUCAP 219 (H) 04/14/2023   HGBA1C 10.0 (H) 01/25/2023    Review of Glycemic Control  Latest Reference Range & Units 04/13/23 21:33 04/14/23 07:36  Glucose-Capillary 70 - 99 mg/dL 409 (H) 811 (H)   Diabetes history: DM 2 Outpatient Diabetes medications: farxiga 10 mg Daily, Januvia 50 mg Daily Current orders for Inpatient glycemic control:  None  A1c 10% on 9/4 Diabetes Coordinator spoke with pt at that time  Inpatient Diabetes Program Recommendations:    -   Novolog 0-9 units Q4 while NPO, may need some basal insulin if trends continue to be elevated.   Thanks,  Christena Deem RN, MSN, BC-ADM Inpatient Diabetes Coordinator Team Pager [phone] (8a-5p)

## 2023-04-14 NOTE — Progress Notes (Signed)
Progress Note     Patient: Connor Vaughn:[id number] DOB: Nov 15, 1960 DOA: 04/12/2023     0 DOS: the patient was seen and examined on 04/13/2023   Brief hospital course: The patient is a 62 yr old man who presented to [facility] on 04/12/2023 with complaints of shortness of breath and abdominal distention. 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 He follows with interventional radiology for his recurrent thoracenteses. TIPS procedure has been planned.   He was found to have a large right pleural effusion and a large ascites. He was admitted to a telemetry bed. He underwent a thoracentesis on the morning of 04/13/2023. 2.4 liters were removed. Paracentesis was planned for 04/14/2023. He has received albumin.    The patient feels better after paracentesis. Abdomen is less distended.   Assessment and Plan: Assessment/Plan Principal Problem:   Hydrothorax Active Problems:   Alcoholic cirrhosis of liver with ascites (HCC)   Recurrent right pleural effusion/hepatic hydrothorax   CKD stage 3a, GFR 45-59 ml/min (HCC)   DM (diabetes mellitus), type 2 with complications (HCC)   Hypertrophic cardiomyopathy (HCC)   ICD (implantable cardioverter-defibrillator) in place   Portal hypertension (HCC)   Cirrhosis Hydrothorax Ascites > Patient presenting with shortness of breath.  Known history of cirrhosis with recurrent hydrothorax.  Etiology of cirrhosis believed to be combination of alcohol use and chronic hepatitis C (did complete treatment for hepatitis C as well) > Also reporting abdominal distention and noted to have severe ascites on imaging which is new. > Patient has had 14-15 thoracenteses for hydrothorax in the past but has never required paracentesis for ascites.  Follows with GI in [place] and interventional radiology. > Of note, recently seen by interventional  radiology with plan for TIPS procedure. > As above, CT imaging today confirms large right pleural effusion and large volume ascites. > Lab workup largely stable in the ED.  Not currently requiring oxygen, but is subjectively short of breath and feels much more comfortable being admitted for observation and intervention. - Monitor on telemetry overnight - 2.4 L removed in thoracentesis on 04/13/2023. - Paracentesis was performed on the morning of 04/14/2023. 1.6 liters of fluids was removed. Chemistry is consistent with transudate. Cultures are pending. - Will continue with home Lasix, spironolactone - Continue home PPI   CKD 3A > Creatinine stable in the ED - Trend renal function and electrolytes   Diabetes - SSI   Spinal stenosis with neurogenic claudication - Continue home Lyrica   Depression Anxiety - Continue home duloxetine, BuSpar, hydroxyzine   Hypertension - Continue Lasix, spironolactone, carvedilol   DVT prophylaxis:      Lovenox Code Status:              DNR/DNI, confirmed with patient Family Communication:       None on admission Disposition Plan:              Patient is from:                        Home             Anticipated DC to:                   Home             Anticipated DC date:  1 to 3 days             Anticipated DC barriers:         None    Consults called:        Order placed for interventional radiology evaluation   Subjective: The patient is resting comfortably. He is anxious to get the fluid off of the abdomen.   Physical Exam:       Vitals:    04/13/23 0845 04/13/23 1125 04/13/23 1307 04/13/23 1619  BP: (!) 144/99 114/84   100/66  Pulse: 91     84  Resp: 18     20  Temp: 97.6 F (36.4 C)     97.6 F (36.4 C)  TempSrc: Oral     Oral  SpO2: 100%   94% 95%  Weight:          Height:            Exam:   Constitutional:  The patient is awake, alert, and oriented x 3. No acute distress. He states that he is feeling  better. Respiratory:  No increased work of breathing. No wheezes, rales, or rhonchi No tactile fremitus Cardiovascular:  Regular rate and rhythm No murmurs, ectopy, or gallups. No lateral PMI. No thrills. Abdomen:  Abdomen is soft, notably less distended, and non-tender No hernias, masses, or organomegaly Normoactive bowel sounds.  Musculoskeletal:  No cyanosis, clubbing, or edema Skin:  No rashes, lesions, ulcers palpation of skin: no induration or nodules Neurologic:  CN 2-12 intact Sensation all 4 extremities intact Psychiatric:  Mental status Mood, affect appropriate Orientation to person, place, time  judgment and insight appear intact   Family Communication: None available   Disposition: Status is: Inpatient Remains inpatient appropriate because: Patient with severe cirrhosis with severe ascites and pleural effusions.   Planned Discharge Destination: Home       Time spent: 34 minutes   Author: Pritesh Sobecki, DO 04/14/2023 3:00 PM   For on call review [email or web address].

## 2023-04-14 NOTE — Procedures (Signed)
PROCEDURE SUMMARY:  Successful ultrasound guided paracentesis from the left lower  quadrant.  Yielded 1.6 L of pale yelloe fluid.  No immediate complications.  The patient tolerated the procedure well.   Specimen was sent for labs.  EBL < 2mL  If the patient eventually requires >/=2 paracenteses in a 30 day period, screening evaluation by the [facility] will be assessed.

## 2023-04-15 DIAGNOSIS — J948 Other specified pleural conditions: Secondary | ICD-10-CM | POA: Diagnosis not present

## 2023-04-15 LAB — CBC WITH DIFFERENTIAL/PLATELET
Abs Immature Granulocytes: 0.01 10*3/uL (ref 0.00–0.07)
Basophils Absolute: 0 10*3/uL (ref 0.0–0.1)
Basophils Relative: 1 %
Eosinophils Absolute: 0.2 10*3/uL (ref 0.0–0.5)
Eosinophils Relative: 7 %
HCT: 29.8 % — ABNORMAL LOW (ref 39.0–52.0)
Hemoglobin: 8.7 g/dL — ABNORMAL LOW (ref 13.0–17.0)
Immature Granulocytes: 0 %
Lymphocytes Relative: 17 %
Lymphs Abs: 0.5 10*3/uL — ABNORMAL LOW (ref 0.7–4.0)
MCH: 19.7 pg — ABNORMAL LOW (ref 26.0–34.0)
MCHC: 29.2 g/dL — ABNORMAL LOW (ref 30.0–36.0)
MCV: 67.6 fL — ABNORMAL LOW (ref 80.0–100.0)
Monocytes Absolute: 0.4 10*3/uL (ref 0.1–1.0)
Monocytes Relative: 14 %
Neutro Abs: 1.7 10*3/uL (ref 1.7–7.7)
Neutrophils Relative %: 61 %
Platelets: 99 10*3/uL — ABNORMAL LOW (ref 150–400)
RBC: 4.41 MIL/uL (ref 4.22–5.81)
RDW: 19.3 % — ABNORMAL HIGH (ref 11.5–15.5)
WBC: 2.8 10*3/uL — ABNORMAL LOW (ref 4.0–10.5)
nRBC: 0 % (ref 0.0–0.2)

## 2023-04-15 LAB — COMPREHENSIVE METABOLIC PANEL
ALT: 17 U/L (ref 0–44)
AST: 26 U/L (ref 15–41)
Albumin: 3 g/dL — ABNORMAL LOW (ref 3.5–5.0)
Alkaline Phosphatase: 102 U/L (ref 38–126)
Anion gap: 6 (ref 5–15)
BUN: 14 mg/dL (ref 6–20)
CO2: 27 mmol/L (ref 22–32)
Calcium: 8.8 mg/dL — ABNORMAL LOW (ref 8.9–10.3)
Chloride: 99 mmol/L (ref 98–111)
Creatinine, Ser: 0.84 mg/dL (ref 0.61–1.24)
GFR, Estimated: >60 mL/min (ref >60)
Glucose, Bld: 236 mg/dL — ABNORMAL HIGH (ref 70–99)
Potassium: 4.1 mmol/L (ref 3.5–5.1)
Sodium: 132 mmol/L — ABNORMAL LOW (ref 135–145)
Total Bilirubin: 0.5 mg/dL (ref <1.2)
Total Protein: 6.2 g/dL — ABNORMAL LOW (ref 6.5–8.1)

## 2023-04-15 LAB — GLUCOSE, CAPILLARY
Glucose-Capillary: 147 mg/dL — ABNORMAL HIGH (ref 70–99)
Glucose-Capillary: 194 mg/dL — ABNORMAL HIGH (ref 70–99)
Glucose-Capillary: 199 mg/dL — ABNORMAL HIGH (ref 70–99)
Glucose-Capillary: 207 mg/dL — ABNORMAL HIGH (ref 70–99)

## 2023-04-15 MED ORDER — INSULIN DETEMIR 100 UNIT/ML ~~LOC~~ SOLN
18.0000 [IU] | Freq: Every day | SUBCUTANEOUS | Status: DC
  Administered 2023-04-15 – 2023-04-18 (×4): 18 [IU] via SUBCUTANEOUS
  Filled 2023-04-15 (×5): qty 0.18

## 2023-04-15 NOTE — Progress Notes (Addendum)
Progress Note     Patient: Connor Vaughn ZOX:[id number] DOB: 1961-05-13 DOA: 04/12/2023     0 DOS: the patient was seen and examined on 04/13/2023   Brief hospital course: The patient is a 62 yr old man who presented to [facility] on 04/12/2023 with complaints of shortness of breath and abdominal distention. 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 He follows with interventional radiology for his recurrent thoracenteses. TIPS procedure has been planned.   He was found to have a large right pleural effusion and a large ascites. He was admitted to a telemetry bed. He underwent a thoracentesis on the morning of 04/13/2023. 2.4 liters were removed. Paracentesis was planned for 04/14/2023. He has received albumin.    The patient feels better after paracentesis. Abdomen is less distended. However, he continues to complain of pain in his scrotum (swollen with fluid) when he coughed.  There is a plan for a TIPS on Monday.    Assessment and Plan: Assessment/Plan Principal Problem:   Hydrothorax Active Problems:   Alcoholic cirrhosis of liver with ascites (HCC)   Recurrent right pleural effusion/hepatic hydrothorax   CKD stage 3a, GFR 45-59 ml/min (HCC)   DM (diabetes mellitus), type 2 with complications (HCC)   Hypertrophic cardiomyopathy (HCC)   ICD (implantable cardioverter-defibrillator) in place   Portal hypertension (HCC)   Cirrhosis Hydrothorax Ascites > Patient presenting with shortness of breath.  Known history of cirrhosis with recurrent hydrothorax.  Etiology of cirrhosis believed to be combination of alcohol use and chronic hepatitis C (did complete treatment for hepatitis C as well) > Also reporting abdominal distention and noted to have severe ascites on imaging which is new. > Patient has had 14-15 thoracenteses for hydrothorax in the past but has never  required paracentesis for ascites.  Follows with GI in [place] and interventional radiology. > Of note, recently seen by interventional radiology with plan for TIPS procedure. > As above, CT imaging today confirms large right pleural effusion and large volume ascites. > Lab workup largely stable in the ED.  Not currently requiring oxygen, but is subjectively short of breath and feels much more comfortable being admitted for observation and intervention. - Monitor on telemetry overnight - 2.4 L removed in thoracentesis on 04/13/2023. The fluid is transudative. - Paracentesis was performed on the morning of 04/14/2023. 1.6 liters of fluids was removed. Chemistry is consistent with transudate.  -Cultures from thoracentesis and paracentesis have had no growth. - Will continue with home Lasix, spironolactone - Continue home PPI   CKD 3A > Creatinine stable in the ED - Creatinine likely at baseline at 0.84.   Diabetes - SSI   Spinal stenosis with neurogenic claudication - Continue home Lyrica   Depression Anxiety - Continue home duloxetine, BuSpar, hydroxyzine   Hypertension - Continue Lasix, spironolactone, carvedilol   DVT prophylaxis:      Lovenox Code Status:              DNR/DNI, confirmed with patient Family Communication:       None on admission Disposition Plan:              Patient is from:                        Home             Anticipated DC to:  Home             Anticipated DC date:               1 to 3 days             Anticipated DC barriers:         None    Consults called:        Order placed for interventional radiology evaluation   Subjective: The patient is resting comfortably. He is anxious to get the fluid off of the abdomen.   Physical Exam:        Vitals:   04/15/23 0506 04/15/23 0827  BP: 100/68 112/70  Pulse: 82 81  Resp:  18  Temp: 98 F (36.7 C) (!) 97.3 F (36.3 C)  SpO2: 97% 99%    Exam:   Constitutional:  The patient  is awake, alert, and oriented x 3. No acute distress. He states that he is feeling better. Respiratory:  No increased work of breathing. No wheezes, rales, or rhonchi No tactile fremitus Cardiovascular:  Regular rate and rhythm No murmurs, ectopy, or gallups. No lateral PMI. No thrills. Abdomen:  Abdomen is soft, notably less distended, and non-tender No hernias, masses, or organomegaly Normoactive bowel sounds.  Musculoskeletal:  No cyanosis, clubbing, or edema Skin:  No rashes, lesions, ulcers palpation of skin: no induration or nodules Neurologic:  CN 2-12 intact Sensation all 4 extremities intact Psychiatric:  Mental status Mood, affect appropriate Orientation to person, place, time  judgment and insight appear intact   Family Communication: None available   Disposition: Status is: Inpatient Remains inpatient appropriate because: Patient with severe cirrhosis with severe ascites and pleural effusions.   Planned Discharge Destination: Home likely on 04/18/2023.       Time spent: 32 minutes   Author: Quilla Freeze, DO 04/15/2023 3:00 PM   For on call review [email or web address].

## 2023-04-16 DIAGNOSIS — J948 Other specified pleural conditions: Secondary | ICD-10-CM | POA: Diagnosis not present

## 2023-04-16 LAB — GLUCOSE, CAPILLARY
Glucose-Capillary: 75 mg/dL (ref 70–99)
Glucose-Capillary: 150 mg/dL — ABNORMAL HIGH (ref 70–99)
Glucose-Capillary: 185 mg/dL — ABNORMAL HIGH (ref 70–99)
Glucose-Capillary: 179 mg/dL — ABNORMAL HIGH (ref 70–99)

## 2023-04-16 LAB — TYPE AND SCREEN
ABO/RH(D): O POS
Antibody Screen: NEGATIVE

## 2023-04-16 MED ORDER — ORAL CARE MOUTH RINSE: 15.0000 mL | OROMUCOSAL | Status: DC | PRN

## 2023-04-16 NOTE — Plan of Care (Signed)
°  Problem: Education: °Goal: Knowledge of General Education information will improve °Description: Including pain rating scale, medication(s)/side effects and non-pharmacologic comfort measures °Outcome: Completed/Met °  °

## 2023-04-16 NOTE — Progress Notes (Signed)
Progress Note     Patient: Connor Vaughn QMV:[id number] DOB: 07-04-1960 DOA: 04/12/2023     0 DOS: the patient was seen and examined on 04/16/2023   Brief hospital course: The patient is a 62 yr old man who presented to [facility] on 04/12/2023 with complaints of shortness of breath and abdominal distention. 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 He follows with interventional radiology for his recurrent thoracenteses. TIPS procedure has been planned.   He was found to have a large right pleural effusion and a large ascites. He was admitted to a telemetry bed. He underwent a thoracentesis on the morning of 04/13/2023. 2.4 liters were removed. Paracentesis was planned for 04/14/2023. He has received albumin.    The patient feels better after paracentesis. Abdomen is less distended. However, he continues to complain of pain in his scrotum (swollen with fluid) when he coughed.  There is a plan for a TIPS on Monday.    Assessment and Plan: Assessment/Plan Principal Problem:   Hydrothorax   Ascites Active Problems:   Alcoholic cirrhosis of liver with ascites (HCC)   Recurrent right pleural effusion/hepatic hydrothorax   CKD stage 3a, GFR 45-59 ml/min (HCC)   DM (diabetes mellitus), type 2 with complications (HCC)   Hypertrophic cardiomyopathy (HCC)   ICD (implantable cardioverter-defibrillator) in place   Portal hypertension (HCC)   Cirrhosis Hydrothorax Ascites > Patient presenting with shortness of breath.  Known history of cirrhosis with recurrent hydrothorax.  Etiology of cirrhosis believed to be combination of alcohol use and chronic hepatitis C (did complete treatment for hepatitis C as well). > Also reporting abdominal distention and noted to have severe ascites on imaging which is new. > Patient has had 14-15 thoracenteses for hydrothorax in the past but has  never required paracentesis for ascites.  Follows with GI in [place] and interventional radiology. > Of note, recently seen by interventional radiology with plan for TIPS procedure. > As above, CT imaging today confirms large right pleural effusion and large volume ascites. > Lab workup largely stable in the ED.  Not currently requiring oxygen, but is subjectively short of breath and feels much more comfortable being admitted for observation and intervention. - Monitor on telemetry overnight - 2.4 L removed in thoracentesis on 04/13/2023. The fluid is transudative. - Paracentesis was performed on the morning of 04/14/2023. 1.6 liters of fluids was removed. Chemistry is consistent with transudate.  -Cultures from thoracentesis and paracentesis have had no growth. - Will continue with home Lasix, spironolactone - Continue home PPI   CKD 3A > Creatinine stable in the ED - Creatinine likely at baseline at 0.84.   Diabetes - SSI   Spinal stenosis with neurogenic claudication - Continue home Lyrica   Depression Anxiety - Continue home duloxetine, BuSpar, hydroxyzine   Hypertension - Continue Lasix, spironolactone, carvedilol   DVT prophylaxis:      Lovenox Code Status:              DNR/DNI, confirmed with patient Family Communication:       None on admission Disposition Plan:              Patient is from:                        Home             Anticipated DC to:  Home             Anticipated DC date:               1 to 3 days             Anticipated DC barriers:         None    Consults called:        Order placed for interventional radiology evaluation   Subjective: The patient is resting comfortably. He is anxious to get the fluid off of the abdomen.   Physical Exam:        Vitals:   04/16/23 0510 04/16/23 0726  BP: 91/62 130/78  Pulse: 78 73  Resp: 20 18  Temp: 97.6 F (36.4 C) (!) 97.3 F (36.3 C)  SpO2: 98% 99%    Exam:   Constitutional:  The  patient is awake, alert, and oriented x 3. No acute distress. He states that he is feeling better. Respiratory:  No increased work of breathing. No wheezes, rales, or rhonchi No tactile fremitus Cardiovascular:  Regular rate and rhythm No murmurs, ectopy, or gallups. No lateral PMI. No thrills. Abdomen:  Abdomen is soft, notably less distended, and non-tender No hernias, masses, or organomegaly Normoactive bowel sounds.  Musculoskeletal:  No cyanosis, clubbing, or edema Skin:  No rashes, lesions, ulcers palpation of skin: no induration or nodules Neurologic:  CN 2-12 intact Sensation all 4 extremities intact Psychiatric:  Mental status Mood, affect appropriate Orientation to person, place, time  judgment and insight appear intact   Family Communication: None available   Disposition: Status is: Inpatient Remains inpatient appropriate because: Patient with severe cirrhosis with severe ascites and pleural effusions.   Planned Discharge Destination: Home likely on 04/18/2023.   Time spent: 32 minutes   Author: Aby Gessel, DO 04/16/2023 12:46 PM   For on call review [email or web address].

## 2023-04-16 NOTE — Plan of Care (Signed)
  Problem: Clinical Measurements: Goal: Diagnostic test results will improve Outcome: Progressing Goal: Respiratory complications will improve Outcome: Completed/Met Goal: Cardiovascular complication will be avoided Outcome: Completed/Met

## 2023-04-16 NOTE — Anesthesia Preprocedure Evaluation (Addendum)
Anesthesia Evaluation  Patient identified by MRN, date of birth, ID band Patient awake    Reviewed: Allergy & Precautions, H&P , NPO status , Patient's Chart, lab work & pertinent test results  Airway Mallampati: II  TM Distance: >3 FB Neck ROM: Full    Dental no notable dental hx. (+) Teeth Intact, Dental Advisory Given   Pulmonary neg pulmonary ROS, sleep apnea , former smoker   Pulmonary exam normal breath sounds clear to auscultation       Cardiovascular Exercise Tolerance: Good + Cardiac Defibrillator + Valvular Problems/Murmurs AS  Rhythm:Regular Rate:Normal     Neuro/Psych negative neurological ROS  negative psych ROS   GI/Hepatic negative GI ROS, Neg liver ROS,,,(+) Cirrhosis   Esophageal Varices and ascites      Endo/Other  negative endocrine ROSdiabetes, Type 2, Oral Hypoglycemic Agents, Insulin Dependent    Renal/GU negative Renal ROS  negative genitourinary   Musculoskeletal   Abdominal   Peds  Hematology negative hematology ROS (+)   Anesthesia Other Findings   Reproductive/Obstetrics negative OB ROS                             Anesthesia Physical Anesthesia Plan  ASA: 4  Anesthesia Plan: General   Post-op Pain Management:    Induction: Intravenous  PONV Risk Score and Plan: 3 and Ondansetron and Midazolam  Airway Management Planned: Oral ETT  Additional Equipment: Arterial line  Intra-op Plan:   Post-operative Plan: Extubation in OR and Possible Post-op intubation/ventilation  Informed Consent: I have reviewed the patients History and Physical, chart, labs and discussed the procedure including the risks, benefits and alternatives for the proposed anesthesia with the patient or authorized representative who has indicated his/her understanding and acceptance.   Patient has DNR.  Discussed DNR with patient and Suspend DNR.   Dental advisory given  Plan  Discussed with: CRNA  Anesthesia Plan Comments:        Anesthesia Quick Evaluation

## 2023-04-17 ENCOUNTER — Inpatient Hospital Stay (HOSPITAL_COMMUNITY): Payer: Medicare (Managed Care) | Admitting: Registered Nurse

## 2023-04-17 ENCOUNTER — Inpatient Hospital Stay (HOSPITAL_COMMUNITY): Payer: Medicare (Managed Care)

## 2023-04-17 ENCOUNTER — Encounter (HOSPITAL_COMMUNITY)
Admission: EM | Disposition: A | Discharge status: Home / Self Care | Payer: Self-pay | Source: Home / Self Care | Attending: Internal Medicine

## 2023-04-17 ENCOUNTER — Encounter (HOSPITAL_COMMUNITY): Payer: Self-pay | Admitting: Internal Medicine

## 2023-04-17 ENCOUNTER — Other Ambulatory Visit: Payer: Self-pay

## 2023-04-17 DIAGNOSIS — K7031 Alcoholic cirrhosis of liver with ascites: Secondary | ICD-10-CM | POA: Diagnosis not present

## 2023-04-17 DIAGNOSIS — J948 Other specified pleural conditions: Secondary | ICD-10-CM | POA: Diagnosis not present

## 2023-04-17 DIAGNOSIS — J918 Pleural effusion in other conditions classified elsewhere: Secondary | ICD-10-CM

## 2023-04-17 DIAGNOSIS — K769 Liver disease, unspecified: Secondary | ICD-10-CM | POA: Diagnosis not present

## 2023-04-17 DIAGNOSIS — K766 Portal hypertension: Secondary | ICD-10-CM | POA: Diagnosis not present

## 2023-04-17 HISTORY — PX: IR EMBO VENOUS NOT HEMORR HEMANG  INC GUIDE ROADMAPPING: IMG5447

## 2023-04-17 HISTORY — PX: IR PARACENTESIS: IMG2679

## 2023-04-17 HISTORY — PX: TIPS PROCEDURE: SHX808

## 2023-04-17 HISTORY — PX: IR THORACENTESIS ASP PLEURAL SPACE W/IMG GUIDE: IMG5380

## 2023-04-17 HISTORY — PX: IR US GUIDE VASC ACCESS RIGHT: IMG2390

## 2023-04-17 HISTORY — PX: IR TIPS: IMG2295

## 2023-04-17 LAB — CBC
HCT: 30.8 % — ABNORMAL LOW (ref 39.0–52.0)
Hemoglobin: 8.8 g/dL — ABNORMAL LOW (ref 13.0–17.0)
MCH: 19.5 pg — ABNORMAL LOW (ref 26.0–34.0)
MCHC: 28.6 g/dL — ABNORMAL LOW (ref 30.0–36.0)
MCV: 68.3 fL — ABNORMAL LOW (ref 80.0–100.0)
Platelets: 111 10*3/uL — ABNORMAL LOW (ref 150–400)
RBC: 4.51 MIL/uL (ref 4.22–5.81)
RDW: 19.7 % — ABNORMAL HIGH (ref 11.5–15.5)
WBC: 3.5 10*3/uL — ABNORMAL LOW (ref 4.0–10.5)
nRBC: 0 % (ref 0.0–0.2)

## 2023-04-17 LAB — COMPREHENSIVE METABOLIC PANEL
ALT: 22 U/L (ref 0–44)
AST: 38 U/L (ref 15–41)
Albumin: 3.5 g/dL (ref 3.5–5.0)
Alkaline Phosphatase: 90 U/L (ref 38–126)
Anion gap: 9 (ref 5–15)
BUN: 11 mg/dL (ref 6–20)
CO2: 24 mmol/L (ref 22–32)
Calcium: 8.1 mg/dL — ABNORMAL LOW (ref 8.9–10.3)
Chloride: 103 mmol/L (ref 98–111)
Creatinine, Ser: 0.87 mg/dL (ref 0.61–1.24)
GFR, Estimated: >60 mL/min (ref >60)
Glucose, Bld: 204 mg/dL — ABNORMAL HIGH (ref 70–99)
Potassium: 4 mmol/L (ref 3.5–5.1)
Sodium: 136 mmol/L (ref 135–145)
Total Bilirubin: 0.8 mg/dL (ref <1.2)
Total Protein: 6.2 g/dL — ABNORMAL LOW (ref 6.5–8.1)

## 2023-04-17 LAB — GLUCOSE, CAPILLARY
Glucose-Capillary: 77 mg/dL (ref 70–99)
Glucose-Capillary: 70 mg/dL (ref 70–99)
Glucose-Capillary: 104 mg/dL — ABNORMAL HIGH (ref 70–99)
Glucose-Capillary: 99 mg/dL (ref 70–99)
Glucose-Capillary: 118 mg/dL — ABNORMAL HIGH (ref 70–99)
Glucose-Capillary: 119 mg/dL — ABNORMAL HIGH (ref 70–99)
Glucose-Capillary: 182 mg/dL — ABNORMAL HIGH (ref 70–99)
Glucose-Capillary: 286 mg/dL — ABNORMAL HIGH (ref 70–99)

## 2023-04-17 LAB — PROTIME-INR
INR: 1.1 (ref 0.8–1.2)
Prothrombin Time: 14.1 s (ref 11.4–15.2)

## 2023-04-17 LAB — CYTOLOGY - NON PAP

## 2023-04-17 LAB — ACID FAST SMEAR (AFB, MYCOBACTERIA): Acid Fast Smear: NEGATIVE

## 2023-04-17 LAB — PH, BODY FLUID: pH, Body Fluid: 7.6

## 2023-04-17 SURGERY — INSERTION, SHUNT, INTRAHEPATIC PORTOSYSTEMIC, TRANSJUGULAR
Anesthesia: General

## 2023-04-17 MED ORDER — LACTULOSE 10 GM/15ML PO SOLN
10.0000 g | Freq: Two times a day (BID) | ORAL | Status: DC
  Administered 2023-04-17 – 2023-04-18 (×2): 10 g via ORAL
  Filled 2023-04-17 (×2): qty 15

## 2023-04-17 MED ORDER — ROCURONIUM BROMIDE 10 MG/ML (PF) SYRINGE
PREFILLED_SYRINGE | INTRAVENOUS | Status: DC | PRN
  Administered 2023-04-17: 10 mg via INTRAVENOUS
  Administered 2023-04-17: 70 mg via INTRAVENOUS

## 2023-04-17 MED ORDER — FENTANYL CITRATE (PF) 100 MCG/2ML IJ SOLN
INTRAMUSCULAR | Status: AC
  Filled 2023-04-17: qty 2

## 2023-04-17 MED ORDER — ORAL CARE MOUTH RINSE: 15.0000 mL | Freq: Once | OROMUCOSAL | Status: AC

## 2023-04-17 MED ORDER — LACTATED RINGERS IV SOLN: INTRAVENOUS | Status: DC

## 2023-04-17 MED ORDER — CHLORHEXIDINE GLUCONATE 0.12 % MT SOLN
OROMUCOSAL | Status: AC
  Administered 2023-04-17: 15 mL via OROMUCOSAL
  Filled 2023-04-17: qty 15

## 2023-04-17 MED ORDER — FENTANYL CITRATE (PF) 250 MCG/5ML IJ SOLN
INTRAMUSCULAR | Status: DC | PRN
  Administered 2023-04-17: 100 ug via INTRAVENOUS

## 2023-04-17 MED ORDER — DEXTROSE 50 % IV SOLN
INTRAVENOUS | Status: AC
  Filled 2023-04-17: qty 50

## 2023-04-17 MED ORDER — LIDOCAINE HCL 1 % IJ SOLN
INTRAMUSCULAR | Status: AC
  Filled 2023-04-17: qty 20

## 2023-04-17 MED ORDER — IOHEXOL 300 MG/ML  SOLN
100.0000 mL | Freq: Once | INTRAMUSCULAR | Status: AC | PRN
  Administered 2023-04-17: 40 mL via INTRAVENOUS

## 2023-04-17 MED ORDER — EPHEDRINE SULFATE-NACL 50-0.9 MG/10ML-% IV SOSY
PREFILLED_SYRINGE | INTRAVENOUS | Status: DC | PRN
  Administered 2023-04-17: 5 mg via INTRAVENOUS
  Administered 2023-04-17: 10 mg via INTRAVENOUS
  Administered 2023-04-17 (×3): 5 mg via INTRAVENOUS

## 2023-04-17 MED ORDER — SODIUM CHLORIDE 0.9 % IV SOLN: INTRAVENOUS | Status: DC | PRN

## 2023-04-17 MED ORDER — PROPOFOL 10 MG/ML IV BOLUS
INTRAVENOUS | Status: DC | PRN
  Administered 2023-04-17: 100 mg via INTRAVENOUS

## 2023-04-17 MED ORDER — SUGAMMADEX SODIUM 200 MG/2ML IV SOLN
INTRAVENOUS | Status: DC | PRN
  Administered 2023-04-17: 200 mg via INTRAVENOUS

## 2023-04-17 MED ORDER — FENTANYL CITRATE (PF) 100 MCG/2ML IJ SOLN: 25.0000 ug | INTRAMUSCULAR | Status: DC | PRN

## 2023-04-17 MED ORDER — LIDOCAINE 2% (20 MG/ML) 5 ML SYRINGE
INTRAMUSCULAR | Status: DC | PRN
  Administered 2023-04-17: 60 mg via INTRAVENOUS

## 2023-04-17 MED ORDER — ALBUMIN HUMAN 5 % IV SOLN
12.5000 g | Freq: Once | INTRAVENOUS | Status: AC
  Administered 2023-04-17: 12.5 g via INTRAVENOUS

## 2023-04-17 MED ORDER — DEXTROSE 50 % IV SOLN
12.5000 g | Freq: Once | INTRAVENOUS | Status: AC
  Administered 2023-04-17: 12.5 g via INTRAVENOUS

## 2023-04-17 MED ORDER — DEXAMETHASONE SODIUM PHOSPHATE 10 MG/ML IJ SOLN
INTRAMUSCULAR | Status: DC | PRN
  Administered 2023-04-17: 5 mg via INTRAVENOUS

## 2023-04-17 MED ORDER — RIFAXIMIN 550 MG PO TABS
550.0000 mg | ORAL_TABLET | Freq: Two times a day (BID) | ORAL | Status: DC
  Administered 2023-04-17 – 2023-04-19 (×4): 550 mg via ORAL
  Filled 2023-04-17 (×4): qty 1

## 2023-04-17 MED ORDER — LIDOCAINE-EPINEPHRINE 1 %-1:100000 IJ SOLN
INTRAMUSCULAR | Status: AC
  Filled 2023-04-17: qty 1

## 2023-04-17 MED ORDER — CHLORHEXIDINE GLUCONATE 0.12 % MT SOLN: 15.0000 mL | Freq: Once | OROMUCOSAL | Status: AC

## 2023-04-17 MED ORDER — SUCCINYLCHOLINE CHLORIDE 200 MG/10ML IV SOSY: PREFILLED_SYRINGE | INTRAVENOUS | Status: DC | PRN

## 2023-04-17 MED ORDER — IOHEXOL 300 MG/ML  SOLN
150.0000 mL | Freq: Once | INTRAMUSCULAR | Status: AC | PRN
  Administered 2023-04-17: 40 mL via INTRAVENOUS

## 2023-04-17 MED ORDER — ALBUMIN HUMAN 5 % IV SOLN
INTRAVENOUS | Status: AC
  Filled 2023-04-17: qty 250

## 2023-04-17 MED ORDER — ONDANSETRON HCL 4 MG/2ML IJ SOLN
INTRAMUSCULAR | Status: DC | PRN
  Administered 2023-04-17: 4 mg via INTRAVENOUS

## 2023-04-17 MED ORDER — ALBUMIN HUMAN 5 % IV SOLN: INTRAVENOUS | Status: DC | PRN

## 2023-04-17 MED ORDER — SPIRONOLACTONE 25 MG PO TABS
100.0000 mg | ORAL_TABLET | Freq: Two times a day (BID) | ORAL | Status: DC
  Administered 2023-04-18: 100 mg via ORAL
  Filled 2023-04-17: qty 4

## 2023-04-17 MED ORDER — LIDOCAINE HCL 1 % IJ SOLN
20.0000 mL | Freq: Once | INTRAMUSCULAR | Status: AC
  Administered 2023-04-17: 20 mL via INTRADERMAL
  Filled 2023-04-17: qty 20

## 2023-04-17 MED ORDER — PHENYLEPHRINE 80 MCG/ML (10ML) SYRINGE FOR IV PUSH (FOR BLOOD PRESSURE SUPPORT)
PREFILLED_SYRINGE | INTRAVENOUS | Status: DC | PRN
  Administered 2023-04-17: 160 ug via INTRAVENOUS
  Administered 2023-04-17: 80 ug via INTRAVENOUS
  Administered 2023-04-17: 160 ug via INTRAVENOUS
  Administered 2023-04-17: 80 ug via INTRAVENOUS

## 2023-04-17 MED ORDER — LIDOCAINE-EPINEPHRINE 1 %-1:100000 IJ SOLN
20.0000 mL | Freq: Once | INTRAMUSCULAR | Status: AC
  Administered 2023-04-17: 10 mL via INTRADERMAL
  Filled 2023-04-17: qty 20

## 2023-04-17 MED ORDER — FUROSEMIDE 20 MG PO TABS
20.0000 mg | ORAL_TABLET | Freq: Two times a day (BID) | ORAL | Status: DC
  Administered 2023-04-18: 20 mg via ORAL
  Filled 2023-04-17: qty 1

## 2023-04-17 MED ORDER — PHENYLEPHRINE HCL-NACL 20-0.9 MG/250ML-% IV SOLN
INTRAVENOUS | Status: DC | PRN
  Administered 2023-04-17: 50 ug/min via INTRAVENOUS

## 2023-04-17 NOTE — Assessment & Plan Note (Signed)
04-17-2023 recurrent hydrothorax. Had right thoracentesis on 04-13-2023. Pleural effusion returned quickly. Went for TIPS today and repeat right thora for another 1.5 liter  04-18-2023 stable. Will need to see if it returns after his TIPS

## 2023-04-17 NOTE — Progress Notes (Signed)
Interventional Radiology Brief Note:  Patient assessed at bedside alongside Dr. Milford Cage after TIPS with variceal embolization, as well as concurrent paracentesis and thoracentesis.   Patient states he is feeling well.  Per RN, no concern during recovery in PACU. He is alert, conversant, no signs of encephalopathy.   Visible dressings intact.  One noted skin tear to the left dorsal hand.   Continue current interventions.  Return to floor bed.   IR to follow tomorrow AM.  Obtain NH3.   Loyce Dys, MS RD PA-C

## 2023-04-17 NOTE — H&P (Signed)
Referring Physician(s): Dr Theodis Sato  Supervising Physician: Roanna Banning  Patient Status:  [name] - In-pt  Chief Complaint:  Scheduled today for TIPS with possible Right thoracentesis; possible paracentesis and variceal embolization  Subjective:  Pt with recurrent Right Hydrothorax SOB; abd distension FULL Consult per IR performed 11/21 Planned for TIPS; thoracentesis/ paracentesis and variceal embolization in IR with anesthesia today   Allergies: Latex  Medications: Prior to Admission medications   Medication Sig Start Date End Date Taking? Authorizing Provider  busPIRone (BUSPAR) 5 MG tablet Take 5 mg by mouth 2 (two) times daily.   Yes [provider]  carboxymethylcellulose (REFRESH PLUS) 0.5 % SOLN Place 1 drop into both eyes 2 (two) times daily as needed (dry eyes).   Yes [provider]  carvedilol (COREG) 12.5 MG tablet Take 1.5 tablets (18.75 mg total) by mouth 2 (two) times daily. 12/29/20  Yes Graciella Freer, PA-C  dapagliflozin propanediol (FARXIGA) 10 MG TABS tablet Take 10 mg by mouth daily.   Yes [provider]  DULoxetine (CYMBALTA) 30 MG capsule Take 30 mg by mouth daily.   Yes [provider]  DULoxetine (CYMBALTA) 60 MG capsule Take 60 mg by mouth daily.   Yes [provider]  ferrous sulfate 325 (65 FE) MG EC tablet Take 325 mg by mouth daily with breakfast.   Yes [provider]  fexofenadine (ALLEGRA) 60 MG tablet Take 60 mg by mouth daily as needed for allergies.   Yes [provider]  fluticasone (FLONASE) 50 MCG/ACT nasal spray Place 1 spray into both nostrils daily as needed for allergies or rhinitis.   Yes [provider]  furosemide (LASIX) 40 MG tablet Take 1 tablet (40 mg total) by mouth daily. Two tabs twice daily Patient taking differently: Take 40 mg by mouth 2 (two) times daily. 01/29/23  Yes Lanae Boast, MD  hydrOXYzine (VISTARIL) 25 MG capsule Take 25 mg by mouth 3  (three) times daily as needed for anxiety.   Yes [provider]  lidocaine 4 % Place 1 patch onto the skin 2 (two) times daily as needed (pain).   Yes [provider]  montelukast (SINGULAIR) 10 MG tablet Take 10 mg by mouth every evening.   Yes [provider]  pantoprazole (PROTONIX) 40 MG tablet Take 40 mg by mouth daily.   Yes [provider]  pregabalin (LYRICA) 150 MG capsule Take 150 mg by mouth 2 (two) times daily.   Yes [provider]  sitaGLIPtin (JANUVIA) 50 MG tablet Take 50 mg by mouth daily.   Yes [provider]  spironolactone (ALDACTONE) 100 MG tablet Take 200 mg by mouth 2 (two) times daily.   Yes [provider]  albuterol (VENTOLIN HFA) 108 (90 Base) MCG/ACT inhaler Inhale into the lungs every 6 (six) hours as needed for wheezing or shortness of breath. Patient not taking: Reported on 04/12/2023    [provider]     Vital Signs: BP 98/71 (BP Location: Right Arm)   Pulse 76   Temp (!) 97.4 F (36.3 C) (Oral)   Resp 20   Ht 5\' 6"  (1.676 m)   Wt 155 lb 13.8 oz (70.7 kg)   SpO2 95%   BMI 25.16 kg/m   Physical Exam Vitals reviewed.  Constitutional:      General: He is not in acute distress. Cardiovascular:     Rate and Rhythm: Normal rate and regular rhythm.  Pulmonary:  Effort: Pulmonary effort is normal.     Breath sounds: No wheezing.  Skin:    General: Skin is warm.  Neurological:     Mental Status: He is alert and oriented to person, place, and time.  Psychiatric:        Behavior: Behavior normal.     Imaging: IR Paracentesis  Result Date: 04/14/2023 INDICATION: Patient with history of cirrhosis with recurrent pleural effusion. Found to have ascites. Request is for therapeutic and diagnostic paracentesis EXAM: ULTRASOUND GUIDED THERAPEUTIC AND DIAGNOSTIC PARACENTESIS MEDICATIONS: Lidocaine 1% 10 mL COMPLICATIONS: None immediate. PROCEDURE: Informed written consent was  obtained from the patient after a discussion of the risks, benefits and alternatives to treatment. A timeout was performed prior to the initiation of the procedure. Initial ultrasound scanning demonstrates a small amount of ascites within the left lower abdominal quadrant. The lung lower abdomen was prepped and draped in the usual sterile fashion. 1% lidocaine was used for local anesthesia. Following this, a 19 gauge, 7-cm, Yueh catheter was introduced. An ultrasound image was saved for documentation purposes. The paracentesis was performed. The catheter was removed and a dressing was applied. The patient tolerated the procedure well without immediate post procedural complication. FINDINGS: A total of approximately 1.6 L of pale yellow fluid was removed. Samples were sent to the laboratory as requested by the clinical team. IMPRESSION: Successful ultrasound-guided therapeutic and diagnostic left side paracentesis yielding 1.2 liters of peritoneal fluid. Performed by Anders Grant NP PLAN: If the patient eventually requires >/=2 paracenteses in a 30 day period, candidacy for formal evaluation by the [facility] will be assessed. Electronically Signed   By: Malachy Moan M.D.   On: 04/14/2023 13:26   ECHOCARDIOGRAM COMPLETE  Result Date: 04/13/2023    ECHOCARDIOGRAM REPORT   Patient Name:   Connor Vaughn Date of Exam: 04/13/2023 Medical Rec #:  [id number]          Height:       66.0 in Accession #:    [id number]         Weight:       155.9 lb Date of Birth:  10/30/1960          BSA:          1.799 m Patient Age:    62 years           BP:           114/84 mmHg Patient Gender: M                  HR:           86 bpm. Exam Location:  Inpatient Procedure: 2D Echo, Cardiac Doppler and Color Doppler Indications:    Pre-op  History:        Patient has prior history of Echocardiogram examinations, most                 recent 01/26/2023. Hypertrophic  Cardiomyopathy; Risk                 Factors:Diabetes.  Sonographer:    JLS Referring Phys: Lannette Donath SUE-ELLEN MATTHEWS IMPRESSIONS  1. Left ventricular ejection fraction, by estimation, is 60 to 65%. The left ventricle has normal function. The left ventricle has no regional wall motion abnormalities. Left ventricular diastolic parameters are indeterminate.  2. Right ventricular systolic function is normal. The right ventricular size is normal. There is normal pulmonary artery systolic pressure. The estimated right ventricular systolic  pressure is 29.2 mmHg.  3. The mitral valve is normal in structure. Trivial mitral valve regurgitation. No evidence of mitral stenosis.  4. The aortic valve is calcified. Aortic valve regurgitation is not visualized. Moderate to severe aortic valve stenosis. Aortic valve area, by VTI measures 0.85 cm. Aortic valve mean gradient measures 23.6 mmHg. Aortic valve Vmax measures 3.24 m/s. Although the mean AVG and VMax are more c/w mild to moderate AS, the DVI is low at 0.25 and SVI low at 33. 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  5. The inferior vena cava is normal in size with greater than 50% respiratory variability, suggesting right atrial pressure of 3 mmHg. FINDINGS  Left Ventricle: Left ventricular ejection fraction, by estimation, is 60 to 65%. The left ventricle has normal function. The left ventricle has no regional wall motion abnormalities. The left ventricular internal cavity size was normal in size. There is  no left ventricular hypertrophy. Left ventricular diastolic parameters are indeterminate. Normal left ventricular filling pressure. Right Ventricle: The right ventricular size is normal. No increase in right ventricular wall thickness. Right ventricular systolic function is normal. There is normal pulmonary artery systolic pressure. The tricuspid regurgitant velocity is 2.56 m/s, and  with an assumed right  atrial pressure of 3 mmHg, the estimated right ventricular systolic pressure is 29.2 mmHg. Left Atrium: Left atrial size was normal in size. Right Atrium: Right atrial size was normal in size. Pericardium: There is no evidence of pericardial effusion. Mitral Valve: The mitral valve is normal in structure. Trivial mitral valve regurgitation. No evidence of mitral valve stenosis. MV peak gradient, 3.4 mmHg. The mean mitral valve gradient is 2.0 mmHg. Tricuspid Valve: The tricuspid valve is normal in structure. Tricuspid valve regurgitation is trivial. No evidence of tricuspid stenosis. Aortic Valve: The aortic valve is calcified. Aortic valve regurgitation is not visualized. Moderate to severe aortic stenosis is present. Aortic valve mean gradient measures 23.6 mmHg. Aortic valve peak gradient measures 41.9 mmHg. Aortic valve area, by VTI measures 0.85 cm. Pulmonic Valve: The pulmonic valve was normal in structure. Pulmonic valve regurgitation is not visualized. No evidence of pulmonic stenosis. Aorta: The aortic root is normal in size and structure. Venous: The inferior vena cava is normal in size with greater than 50% respiratory variability, suggesting right atrial pressure of 3 mmHg. IAS/Shunts: No atrial level shunt detected by color flow Doppler. Additional Comments: A device lead is visualized.  LEFT VENTRICLE PLAX 2D LVIDd:         3.40 cm      Diastology LVIDs:         2.40 cm      LV e' medial:    6.20 cm/s LV PW:         0.90 cm      LV E/e' medial:  12.1 LV IVS:        0.90 cm      LV e' lateral:   12.80 cm/s LVOT diam:     2.10 cm      LV E/e' lateral: 5.9 LV SV:         59 LV SV Index:   33 LVOT Area:     3.46 cm  LV Volumes (MOD) LV vol d, MOD A2C: 104.0 ml LV vol d, MOD A4C: 105.0 ml LV vol s, MOD A2C: 35.8 ml LV vol s, MOD A4C: 48.7 ml LV SV MOD A2C:     68.2 ml LV  SV MOD A4C:     105.0 ml LV SV MOD BP:      63.0 ml RIGHT VENTRICLE RV Basal diam:  3.40 cm RV Mid diam:    3.00 cm RV S prime:      13.90 cm/s TAPSE (M-mode): 2.5 cm LEFT ATRIUM             Index        RIGHT ATRIUM           Index LA diam:        3.30 cm 1.83 cm/m   RA Area:     12.30 cm LA Vol (A2C):   32.4 ml 18.01 ml/m  RA Volume:   29.60 ml  16.46 ml/m LA Vol (A4C):   15.7 ml 8.73 ml/m LA Biplane Vol: 24.0 ml 13.34 ml/m  AORTIC VALVE                     PULMONIC VALVE AV Area (Vmax):    0.88 cm      PV Vmax:       0.78 m/s AV Area (Vmean):   0.79 cm      PV Peak grad:  2.4 mmHg AV Area (VTI):     0.85 cm AV Vmax:           323.60 cm/s AV Vmean:          228.000 cm/s AV VTI:            0.686 m AV Peak Grad:      41.9 mmHg AV Mean Grad:      23.6 mmHg LVOT Vmax:         82.50 cm/s LVOT Vmean:        52.100 cm/s LVOT VTI:          0.169 m LVOT/AV VTI ratio: 0.25  AORTA Ao Root diam: 3.40 cm Ao Asc diam:  3.30 cm MITRAL VALVE               TRICUSPID VALVE MV Area (PHT): 4.89 cm    TR Peak grad:   26.2 mmHg MV Area VTI:   3.48 cm    TR Vmax:        256.00 cm/s MV Peak grad:  3.4 mmHg MV Mean grad:  2.0 mmHg    SHUNTS MV Vmax:       0.92 m/s    Systemic VTI:  0.17 m MV Vmean:      63.5 cm/s   Systemic Diam: 2.10 cm MV Decel Time: 155 msec MV E velocity: 74.90 cm/s MV A velocity: 72.70 cm/s MV E/A ratio:  1.03 Armanda Magic MD Electronically signed by Armanda Magic MD Signature Date/Time: 04/13/2023/2:31:06 PM    Final    DG Chest 1 View  Result Date: 04/13/2023 CLINICAL DATA:  62 year old with large right pleural effusion and status post right thoracentesis. EXAM: CHEST  1 VIEW COMPARISON:  04/12/2023 FINDINGS: Aeration in the right lung has slightly improved. There is still a large amount of residual right pleural fluid and compressive atelectasis in the right lung. Left lung remains clear. Left chest ICD is again noted. Heart size is stable. Negative for a pneumothorax. IMPRESSION: 1. Negative for pneumothorax. 2. Improved aeration in the right lung following right thoracentesis. Large amount of residual right pleural fluid.  Electronically Signed   By: Richarda Overlie M.D.   On: 04/13/2023 13:55   IR THORACENTESIS ASP PLEURAL SPACE W/IMG GUIDE  Result Date:  04/13/2023 INDICATION: 62 year old male with hydrothorax.  Request for right thoracentesis. EXAM: ULTRASOUND GUIDED RIGHT THORACENTESIS MEDICATIONS: 10 mL 1% lidocaine COMPLICATIONS: None immediate. PROCEDURE: An ultrasound guided thoracentesis was thoroughly discussed with the patient and questions answered. The benefits, risks, alternatives and complications were also discussed. The patient understands and wishes to proceed with the procedure. Written consent was obtained. Ultrasound was performed to localize and mark an adequate pocket of fluid in the right chest. The area was then prepped and draped in the normal sterile fashion. 1% Lidocaine was used for local anesthesia. Under ultrasound guidance a 6 Fr Safe-T-Centesis catheter was introduced. Thoracentesis was performed. The catheter was removed and a dressing applied. FINDINGS: A total of approximately 2.4 liters of yellow fluid was removed. Samples were sent to the laboratory as requested by the clinical team. IMPRESSION: Successful ultrasound guided right thoracentesis yielding 2.4 liters of pleural fluid. Performed by: Loyce Dys PA-C Electronically Signed   By: Richarda Overlie M.D.   On: 04/13/2023 13:53    Labs:  CBC: Recent Labs    04/12/23 1220 04/13/23 0419 04/14/23 0547 04/15/23 0858  WBC 3.6* 2.4* 2.1* 2.8*  HGB 11.1* 8.5* 7.6* 8.7*  HCT 39.3 29.3* 25.8* 29.8*  PLT 112* 87* 76* 99*    COAGS: Recent Labs    01/26/23 1001 04/12/23 1220 04/13/23 0419 04/17/23 0356  INR 1.1 1.0 1.1 1.1    BMP: Recent Labs    04/13/23 0419 04/13/23 1347 04/14/23 0547 04/15/23 0858  NA 136 133* 132* 132*  K 3.9 4.6 3.5 4.1  CL 104 100 102 99  CO2 28 28 25 27   GLUCOSE 229* 431* 221* 236*  BUN 8 12 11 14   CALCIUM 8.3* 8.3* 8.3* 8.8*  CREATININE 0.70 0.87 0.72 0.84  GFRNONAA >60 >60 >60 >60     LIVER FUNCTION TESTS: Recent Labs    04/13/23 0419 04/13/23 1347 04/14/23 0547 04/15/23 0858  BILITOT 0.9 0.4 1.0 0.5  AST 28 32 23 26  ALT 22 21 18 17   ALKPHOS 127* 122 98 102  PROT 5.9* 5.8* 5.5* 6.2*  ALBUMIN 2.7* 2.5* 2.9* 3.0*    Assessment and Plan:  Scheduled for Transjugular intrahepatic portal system shunt placement --- with possible R thoracentesis/ paracentesis and variceal embolization Risks and benefits of TIPS, BRTO and/or additional variceal embolization were discussed with the patient and/or the patient's family including, but not limited to, infection, bleeding, damage to adjacent structures, worsening hepatic and/or cardiac function, worsening and/or the development of altered mental status/encephalopathy, non-target embolization and death.   This interventional procedure involves the use of X-rays and because of the nature of the planned procedure, it is possible that we will have prolonged use of X-ray fluoroscopy.  Potential radiation risks to you include (but are not limited to) the following: - A slightly elevated risk for cancer  several years later in life. This risk is typically less than 0.5% percent. This risk is low in comparison to the normal incidence of human cancer, which is 33% for women and 50% for men according to the [employer]. - Radiation induced injury can include skin redness, resembling a rash, tissue breakdown / ulcers and hair loss (which can be temporary or permanent).   The likelihood of either of these occurring depends on the difficulty of the procedure and whether you are sensitive to radiation due to previous procedures, disease, or genetic conditions.   IF your procedure requires a prolonged use of radiation, you will  be notified and given written instructions for further action.  It is your responsibility to monitor the irradiated area for the 2 weeks following the procedure and to notify your physician if you are  concerned that you have suffered a radiation induced injury.    All of the patient's questions were answered, patient is agreeable to proceed.  Consent signed and in chart.   Electronically Signed: Robet Leu, PA-C 04/17/2023, 6:37 AM   I spent a total of 15 Minutes at the the patient's bedside AND on the patient's hospital floor or unit, greater than 50% of which was counseling/coordinating care for TIPS

## 2023-04-17 NOTE — Subjective & Objective (Signed)
Pt seen and examined. Did well with TIPS today.

## 2023-04-17 NOTE — Progress Notes (Signed)
PROGRESS NOTE    Connor Vaughn  ZOX:[id number] DOB: 12/25/1960 DOA: 04/12/2023 PCP: Ricky Stabs, NP-C  Subjective: Pt seen and examined. Did well with TIPS today.   Hospital Course: HPI: Connor Vaughn is a 62 y.o. male with medical history significant of CKD 3A, diabetes, HCM status post ICD, NSVT, cirrhosis, hepatitis C, spinal stenosis with neurogenic, occasion presenting with shortness of breath and abdominal distention.   Patient presenting with recurrent shortness of breath and new abdominal distention.  Patient has known history of cirrhosis with recurrent hydrothorax and has undergone around 14 or 15 thoracenteses in the past.  He states he has never had a paracentesis.  Chart shows some history of ascites but never severe.  Patient follows up with GI in [place].  Additionally follows with interventional radiology for his recurrent thoracenteses. Has TIPS procedure planned by interventional radiology per chart review.   As above has had worsening shortness of breath and abdominal distention with shortness of breath being similar to previous and abdominal distention being new.   Denies fevers, chills, chest pain, abdominal pain, constipation, diarrhea, nausea, vomiting.   ED Course: Vital signs in the ED notable for blood pressure in the 130s to 180s systolic, heart rate in the 90s to 100s, respiratory rate in the teens to 20s.  Saturating okay on room air.  Does feel uncomfortable and short of breath and was not comfortable with the possibility of going home from the ED.  Lab workup included CMP with albumin 3.3, alk phos 161, glucose 315.  CBC with hemoglobin stable at 11.1, platelets stable at 112, mild leukopenia at 3.6.  Troponin normal.  BNP normal.  Lipase mildly elevated at 127.  Urinalysis with glucose, hemoglobin, ketones, protein.  Chest x-ray with recurrent right hydrothorax.  CT of the chest abdomen pelvis showed were large right pleural effusion, evidence  of cirrhosis with portal hypertension, large volume ascites, right inguinal hernia, CAD.  No intervention in the ED thus far, plan is for IR evaluation.  Significant Events: Admitted 04/12/2023 for recurrent hydrothorax   Significant Labs: Admission WBC 3.6, BUN 6, scr 0.69  Significant Imaging Studies: Admission CT chest/abd/pelvis Large right pleural effusion and associated atelectasis or consolidation. 2. Cirrhosis and portal hypertension. No focal liver lesion. 3. Large volume ascites throughout the  abdomen and pelvis, somewhat increased compared to prior examination. Anasarca. 4. Large, fluid containing right inguinal hernia. 5. Coronary artery disease.  Antibiotic Therapy: Anti-infectives (From admission, onward)    Start     Dose/Rate Route Frequency Ordered Stop   04/17/23 1315  rifaximin (XIFAXAN) tablet 550 mg        550 mg Oral 2 times daily 04/17/23 1312     04/17/23 0800  cefTRIAXone (ROCEPHIN) 2 g in sodium chloride 0.9 % 100 mL IVPB       Note to Pharmacy: Will be administered in IR. Please do not administer on the nursing unit.   2 g 200 mL/hr over 30 Minutes Intravenous To Radiology 04/14/23 1627 04/17/23 0924       Procedures: 04-13-2023 right thoracentesis. 2.4 liter removed 04-14-2023 paracentesis 1.6 liters 04-17-2023 TIPS, right thoracentesis for 1.5 liter, paracentesis for 0.6 liters.  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 Had right thoracentesis on 04-13-2023. Pleural effusion returned quickly. Went for TIPS today and repeat right thora for another 1.5 liter  Pleural effusion associated with hepatic disorder 04-17-2023 recurrent hydrothorax. Had right thoracentesis on 04-13-2023. Pleural effusion  returned quickly. Went for TIPS today and repeat right thora for another 1.5 liter  Portal hypertension (HCC) 04-17-2023 with TIPS today, Reduction of portal HTN with portosystemic gradient from 19 to 7    Recurrent right pleural effusion/hepatic hydrothorax 04-17-2023 recurrent hydrothorax. Had right thoracentesis on 04-13-2023. Pleural effusion returned quickly. Went for TIPS today and repeat right thora for another 1.5 liter  Alcoholic cirrhosis of liver with ascites (HCC) 04-17-2023 had TIPS today. Need to monitor for acute hepatic encephalopathy. Starting lactulose and xifaxan. Monitor NH3  ICD (implantable cardioverter-defibrillator) in place 04-17-2023 stable. Has HCOM  Hypertrophic cardiomyopathy (HCC) 04-17-2023 stable. Has ICD.  DM (diabetes mellitus), type 2 with complications (HCC) 04-17-2023 continue with levemir and SSI.  CKD stage 3a, GFR 45-59 ml/min (HCC) 04-17-2023 monitor Scr. With TIPS in now, will reduce lasix to 20 mg bid and aldactone to 100 mg bid(was 40 mg bid and 200 mg bid). Monitor Scr.  DNR (do not resuscitate)/DNI(Do Not Intubate) 04-17-2023 chronic.   DVT prophylaxis: enoxaparin (LOVENOX) injection 40 mg Start: 04/12/23 1830     Code Status: Limited: Do not attempt resuscitation (DNR) -DNR-LIMITED -Do Not Intubate/DNI  Family Communication: no family at bedside Disposition Plan: return home Reason for continuing need for hospitalization: monitoring for hepatic encephalopathy s/p TIPS.  Objective: Vitals:   04/17/23 1515 04/17/23 1530 04/17/23 1545 04/17/23 1613  BP: 93/60 90/61 95/63  98/67  Pulse: 90 90 90 94  Resp: 13 17 11 18   Temp:   99.1 F (37.3 C) 97.6 F (36.4 C)  TempSrc:    Oral  SpO2: 97% 97% 96% 98%  Weight:      Height:        Intake/Output Summary (Last 24 hours) at 04/17/2023 1722 Last data filed at 04/17/2023 1308 Gross per 24 hour  Intake 3540 ml  Output 850 ml  Net 2690 ml   Filed Weights   04/12/23 1138 04/13/23 0500 04/15/23 0500  Weight: 70.7 kg 70.7 kg 70.7 kg    Examination:  Physical Exam Vitals and nursing note reviewed.  Constitutional:      General: He is not in acute distress.    Appearance: He  is not toxic-appearing.  HENT:     Head: Normocephalic and atraumatic.     Nose: Nose normal.  Cardiovascular:     Rate and Rhythm: Normal rate and regular rhythm.  Pulmonary:     Effort: Pulmonary effort is normal.     Breath sounds: Normal breath sounds.  Abdominal:     General: Bowel sounds are normal.     Palpations: Abdomen is soft.  Genitourinary:    Comments: +scrotal edema Skin:    General: Skin is warm and dry.     Capillary Refill: Capillary refill takes less than 2 seconds.  Neurological:     General: No focal deficit present.     Mental Status: He is alert and oriented to person, place, and time.     Data Reviewed: I have personally reviewed following labs and imaging studies  CBC: Recent Labs  Lab 04/12/23 1220 04/13/23 0419 04/14/23 0547 04/15/23 0858  WBC 3.6* 2.4* 2.1* 2.8*  NEUTROABS  --   --  1.2* 1.7  HGB 11.1* 8.5* 7.6* 8.7*  HCT 39.3 29.3* 25.8* 29.8*  MCV 69.3* 69.1* 67.7* 67.6*  PLT 112* 87* 76* 99*   Basic Metabolic Panel: Recent Labs  Lab 04/12/23 1220 04/13/23 0419 04/13/23 1347 04/14/23 0547 04/15/23 0858  NA 136 136 133* 132* 132*  K  3.8 3.9 4.6 3.5 4.1  CL 103 104 100 102 99  CO2 25 28 28 25 27   GLUCOSE 315* 229* 431* 221* 236*  BUN 6 8 12 11 14   CREATININE 0.69 0.70 0.87 0.72 0.84  CALCIUM 8.9 8.3* 8.3* 8.3* 8.8*   GFR: Estimated Creatinine Clearance: 84.4 mL/min (by C-G formula based on SCr of 0.84 mg/dL). Liver Function Tests: Recent Labs  Lab 04/12/23 1220 04/13/23 0419 04/13/23 1347 04/14/23 0547 04/15/23 0858  AST 41 28 32 23 26  ALT 26 22 21 18 17   ALKPHOS 161* 127* 122 98 102  BILITOT 0.8 0.9 0.4 1.0 0.5  PROT 7.4 5.9* 5.8* 5.5* 6.2*  ALBUMIN 3.3* 2.7* 2.5* 2.9* 3.0*   Recent Labs  Lab 04/12/23 1220  LIPASE 127*   Recent Labs  Lab 04/13/23 1045  AMMONIA 17   Coagulation Profile: Recent Labs  Lab 04/12/23 1220 04/13/23 0419 04/17/23 0356  INR 1.0 1.1 1.1   BNP (last 3 results) Recent Labs     11/02/22 1157 01/24/23 1917 04/12/23 1220  BNP 27.3 40.0 59.5   CBG: Recent Labs  Lab 04/17/23 0718 04/17/23 0740 04/17/23 1005 04/17/23 1304 04/17/23 1623  GLUCAP 70 104* 99 119* 182*    Recent Results (from the past 240 hour(s))  SARS Coronavirus 2 by RT PCR (hospital order, performed in [facility] hospital lab) *cepheid single result test* Anterior Nasal Swab     Status: None   Collection Time: 04/13/23  2:21 AM   Specimen: Anterior Nasal Swab  Result Value Ref Range Status   SARS Coronavirus 2 by RT PCR NEGATIVE NEGATIVE Final    Comment: Performed at [facility], [address], [place], [place] [address]  Gram stain     Status: None   Collection Time: 04/13/23 11:54 AM   Specimen: Lung, Right; Pleural Fluid  Result Value Ref Range Status   Specimen Description PLEURAL  Final   Special Requests right lung  Final   Gram Stain   Final    WBC PRESENT, PREDOMINANTLY MONONUCLEAR NO ORGANISMS SEEN CYTOSPIN SMEAR Performed at [facility], [address] [place], [place] [address]    Report Status 04/13/2023 FINAL  Final  Acid Fast Smear (AFB)     Status: None   Collection Time: 04/13/23 11:54 AM   Specimen: Lung, Right; Pleural Fluid  Result Value Ref Range Status   AFB Specimen Processing Concentration  Final   Acid Fast Smear Negative  Final    Comment: (NOTE) Performed At: [facility] [address] [place], [place] [id number] Jolene Schimke MD WG:[id number]    Source (AFB) PLEURAL  Final    Comment: Performed at [facility], [address] [place], [place] [address]  Culture, body fluid w Gram Stain-bottle     Status: None (Preliminary result)   Collection Time: 04/13/23 11:54 AM   Specimen: Pleura  Result Value Ref Range Status   Specimen Description PLEURAL  Final   Special Requests right lung  Final   Culture   Final    NO GROWTH 4 DAYS Performed at [facility], [address], [place], [place] [address]     Report Status PENDING  Incomplete  Culture, body fluid w Gram Stain-bottle     Status: None (Preliminary result)   Collection Time: 04/14/23 10:50 AM   Specimen: Peritoneal Washings  Result Value Ref Range Status   Specimen Description PERITONEAL  Final   Special Requests  NONE  Final   Culture   Final    NO GROWTH 3 DAYS Performed at [facility], [address] [place], [place] [address]    Report Status PENDING  Incomplete  Gram stain     Status: None   Collection Time: 04/14/23 10:50 AM   Specimen: Peritoneal Washings  Result Value Ref Range Status   Specimen Description PERITONEAL  Final   Special Requests NONE  Final   Gram Stain   Final    WBC PRESENT, PREDOMINANTLY MONONUCLEAR NO ORGANISMS SEEN CYTOSPIN SMEAR Performed at [facility], [address], [place], [place] [address]    Report Status 04/14/2023 FINAL  Final     Radiology Studies: DG Chest Port 1 View  Result Date: 04/17/2023 CLINICAL DATA:  63 year old male status post tips EXAM: PORTABLE CHEST 1 VIEW COMPARISON:  04/13/2023 FINDINGS: Cardiomediastinal silhouette unchanged in size and contour. Left chest wall pacing device/AICD unchanged. Veiled opacity of the right lung, with improved aeration compared to the prior plain film. No meniscus. No pneumothorax.  No left-sided pleural effusion. Partial visualization of transjugular intrahepatic portosystemic shunt in the upper abdomen, as well as embolization of varices in the left upper abdomen. IMPRESSION: No complicating features status post tips Filled opacity of the right hemithorax compatible with residual right pleural fluid though improved in aeration compared to the prior. Electronically Signed   By: Gilmer Mor D.O.   On: 04/17/2023 16:14    Scheduled Meds:  busPIRone  5 mg Oral BID   carvedilol  18.75 mg Oral BID   dextrose       DULoxetine  90 mg Oral Daily   enoxaparin (LOVENOX) injection  40 mg Subcutaneous Q24H   [START ON 04/18/2023]  furosemide  20 mg Oral BID   insulin aspart  0-15 Units Subcutaneous TID WC   insulin detemir  18 Units Subcutaneous QHS   lactulose  10 g Oral BID   pantoprazole  40 mg Oral Daily   pregabalin  150 mg Oral BID   rifaximin  550 mg Oral BID   sodium chloride flush  3 mL Intravenous Q12H   [START ON 04/18/2023] spironolactone  100 mg Oral BID   Continuous Infusions:  albumin human       LOS: 4 days   Time spent: 45 minutes  Carollee Herter, DO  [facility]  04/17/2023, 5:22 PM

## 2023-04-17 NOTE — Transfer of Care (Signed)
Immediate Anesthesia Transfer of Care Note  Patient: Connor Vaughn  Procedure(s) Performed: TRANS-JUGULAR INTRAHEPATIC PORTAL SHUNT (TIPS)  Patient Location: PACU  Anesthesia Type:General  Level of Consciousness: awake  Airway & Oxygen Therapy: Patient Spontanous Breathing and Patient connected to face mask oxygen   Post-op Assessment: VSS. Report given to RN   Post vital signs: Reviewed and stable. Patient still on Neo gtt. Dr Sampson Goon aware  Last Vitals:  Vitals Value Taken Time  BP 103/73 04/17/23 1303  Temp    Pulse 86 04/17/23 1308  Resp 14 04/17/23 1308  SpO2 100 % 04/17/23 1308  Vitals shown include unfiled device data.  Last Pain:  Vitals:   04/17/23 1300  TempSrc:   PainSc: 0-No pain      Patients Stated Pain Goal: 0 (04/16/23 2111)  Complications: No notable events documented.

## 2023-04-17 NOTE — Procedures (Signed)
Vascular and Interventional Radiology Procedure Note  Patient: Connor Vaughn DOB: 10/06/1960 Medical Record Number: [id number] Note Date/Time: 04/17/23 9:20 AM   Performing Physician: Roanna Banning, MD Assistant(s): None  Diagnosis: cirrhosis with pHTN and hepatic hydrothorax   Procedure(s):  TRANSJUGULAR INTRAHEPATIC PORTOSYSTEMIC SHUNT (TIPS)  INTRACARDIAC ECHOCARDIOGRAPHY (ICE) THERAPEUTIC PARACENTESIS and RIGHT THORACENTESIS GASTRO-ESOPHAGEAL VARICEAL EMBOLIZATION  Anesthesia: General Anesthesia Complications: None Estimated Blood Loss: Minimal Specimens: None  Findings:  - access via the RIGHT jugular and femoral veins. - Successful ICE-guided TIPS with placement of 7 cm Viatorr stent - Reduction of portal HTN with portosystemic gradient from 19 to 7 - GEV embolization with single 7 mm AVP 4 plug - Therapeutic R thoracentesis and paracentesis with 1.5 L of serous pleural fluid and 0.6 L of serous ascites removed, respectively.   Plan: - Bedrest x2 hrs and RLE straight. No HOB restrictions. - OK to advance diet as tolerated and resume Home medications. - Lactulose TID and monitor for hepatic encephalopathy. - NH3 in AM.  Final report to follow once all images are reviewed and compared with previous studies.  See detailed dictation with images in PACS The patient tolerated the procedure well without incident or complication and was returned to PACU in stable condition.    Roanna Banning, MD [facility]   Pager. [phone] Clinic. [phone]

## 2023-04-17 NOTE — Assessment & Plan Note (Signed)
04-17-2023 monitor Scr. With TIPS in now, will reduce lasix to 20 mg bid and aldactone to 100 mg bid(was 40 mg bid and 200 mg bid). Monitor Scr.  04-18-2023 given potential findings of "low flow state" in his moderate to severe AS, will reduce diuretics to just qday. May need to stop them all together. Monitor Scr.

## 2023-04-17 NOTE — Plan of Care (Signed)
Problem: Clinical Measurements: Goal: Ability to maintain clinical measurements within normal limits will improve Outcome: Progressing Goal: Will remain free from infection Outcome: Progressing Goal: Diagnostic test results will improve Outcome: Completed/Met

## 2023-04-17 NOTE — Assessment & Plan Note (Signed)
04-17-2023 stable. Has HCOM

## 2023-04-17 NOTE — Assessment & Plan Note (Signed)
04-17-2023 had TIPS today. Need to monitor for acute hepatic encephalopathy. Starting lactulose and xifaxan. Monitor NH3  04-18-2023 still no BM after 2 doses of lactulose. Will increase to TID lactulose. Targeting 3 Bms per day. Continue Xifaxan. Monitoring for acute hepatic encephalopathy after his TIPS. Repeat NH3 in AM.

## 2023-04-17 NOTE — Sedation Documentation (Signed)
Pre Tips Measurements: Right Atrium Main Portal Vein Mean Portosystemic Gradient  Post Tips Measurements: Right Atrium Main Portal Vein 21 mmHg Mean Portosystemic Gradient 

## 2023-04-17 NOTE — Assessment & Plan Note (Signed)
04-17-2023 recurrent hydrothorax. Had right thoracentesis on 04-13-2023. Pleural effusion returned quickly. Went for TIPS today and repeat right thora for another 1.5 liter 04-18-2023 stable. Will need to see if it returns after his TIPS

## 2023-04-17 NOTE — Assessment & Plan Note (Signed)
04-17-2023 with TIPS today, Reduction of portal HTN with portosystemic gradient from 19 to 7   04-18-2023 will reduced lasix and aldactone to qday. Hopefully with decreased portal pressure, he won't develop ascites anymore. Plt count decreasing. Will stop lovenox.

## 2023-04-17 NOTE — Assessment & Plan Note (Signed)
04-17-2023 stable. Has ICD. 04-18-2023 stable. Has ICD

## 2023-04-17 NOTE — Hospital Course (Signed)
HPI: Connor Vaughn is a 62 y.o. male with medical history significant of CKD 3A, diabetes, HCM status post ICD, NSVT, cirrhosis, hepatitis C, spinal stenosis with neurogenic, occasion presenting with shortness of breath and abdominal distention.   Patient presenting with recurrent shortness of breath and new abdominal distention.  Patient has known history of cirrhosis with recurrent hydrothorax and has undergone around 14 or 15 thoracenteses in the past.  He states he has never had a paracentesis.  Chart shows some history of ascites but never severe.  Patient follows up with GI in [place].  Additionally follows with interventional radiology for his recurrent thoracenteses. Has TIPS procedure planned by interventional radiology per chart review.   As above has had worsening shortness of breath and abdominal distention with shortness of breath being similar to previous and abdominal distention being new.   Denies fevers, chills, chest pain, abdominal pain, constipation, diarrhea, nausea, vomiting.   ED Course: Vital signs in the ED notable for blood pressure in the 130s to 180s systolic, heart rate in the 90s to 100s, respiratory rate in the teens to 20s.  Saturating okay on room air.  Does feel uncomfortable and short of breath and was not comfortable with the possibility of going home from the ED.  Lab workup included CMP with albumin 3.3, alk phos 161, glucose 315.  CBC with hemoglobin stable at 11.1, platelets stable at 112, mild leukopenia at 3.6.  Troponin normal.  BNP normal.  Lipase mildly elevated at 127.  Urinalysis with glucose, hemoglobin, ketones, protein.  Chest x-ray with recurrent right hydrothorax.  CT of the chest abdomen pelvis showed were large right pleural effusion, evidence of cirrhosis with portal hypertension, large volume ascites, right inguinal hernia, CAD.  No intervention in the ED thus far, plan is for IR evaluation.  Significant Events: Admitted 04/12/2023 for  recurrent hydrothorax   Significant Labs: Admission WBC 3.6, BUN 6, scr 0.69 04-18-2023 NH3 14(day after TIPS)  Significant Imaging Studies: Admission CT chest/abd/pelvis Large right pleural effusion and associated atelectasis or consolidation. 2. Cirrhosis and portal hypertension. No focal liver lesion. 3. Large volume ascites throughout the  abdomen and pelvis, somewhat increased compared to prior examination. Anasarca. 4. Large, fluid containing right inguinal hernia. 5. Coronary artery disease. 04-13-2023 echo shows 1. Left ventricular ejection fraction, by estimation, is 60 to 65%. The  left ventricle has normal function. The left ventricle has no regional  wall motion abnormalities. Left ventricular diastolic parameters are  indeterminate.   2. Right ventricular systolic function is normal. The right ventricular  size is normal. There is normal pulmonary artery systolic pressure. The  estimated right ventricular systolic pressure is 29.2 mmHg.   3. The mitral valve is normal in structure. Trivial mitral valve  regurgitation. No evidence of mitral stenosis.   4. The aortic valve is calcified. Aortic valve regurgitation is not  visualized. Moderate to severe aortic valve stenosis. Aortic valve area,  by VTI measures 0.85 cm. Aortic valve mean gradient measures 23.6 mmHg.  Aortic valve Vmax measures 3.24 m/s.  Although the mean AVG and VMax are more c/w mild to moderate AS, the DVI is low at 0.25 and SVI low at 33. 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   5. The inferior vena cava is normal in size with greater than 50%  respiratory variability, suggesting right atrial pressure of 3 mmHg.    Antibiotic Therapy: Anti-infectives (From admission, onward)  Start     Dose/Rate Route Frequency Ordered Stop   04/17/23 1315  rifaximin (XIFAXAN) tablet 550 mg        550 mg Oral 2 times daily 04/17/23 1312     04/17/23 0800  cefTRIAXone  (ROCEPHIN) 2 g in sodium chloride 0.9 % 100 mL IVPB       Note to Pharmacy: Will be administered in IR. Please do not administer on the nursing unit.   2 g 200 mL/hr over 30 Minutes Intravenous To Radiology 04/14/23 1627 04/17/23 0924       Procedures: 04-13-2023 right thoracentesis. 2.4 liter removed 04-14-2023 paracentesis 1.6 liters 04-17-2023 TIPS, right thoracentesis for 1.5 liter, paracentesis for 0.6 liters.  Consultants: IR

## 2023-04-17 NOTE — Assessment & Plan Note (Signed)
04-17-2023 chronic.

## 2023-04-17 NOTE — Plan of Care (Signed)
  Problem: Health Behavior/Discharge Planning: Goal: Ability to manage health-related needs will improve Outcome: Progressing   Problem: Clinical Measurements: Goal: Ability to maintain clinical measurements within normal limits will improve Outcome: Progressing Goal: Will remain free from infection Outcome: Progressing Goal: Diagnostic test results will improve Outcome: Progressing   Problem: Activity: Goal: Risk for activity intolerance will decrease Outcome: Progressing   Problem: Nutrition: Goal: Adequate nutrition will be maintained Outcome: Progressing   Problem: Coping: Goal: Level of anxiety will decrease Outcome: Progressing   Problem: Elimination: Goal: Will not experience complications related to bowel motility Outcome: Progressing Goal: Will not experience complications related to urinary retention Outcome: Progressing   Problem: Pain Management: Goal: General experience of comfort will improve Outcome: Progressing   Problem: Safety: Goal: Ability to remain free from injury will improve Outcome: Progressing   Problem: Skin Integrity: Goal: Risk for impaired skin integrity will decrease Outcome: Progressing   Problem: Education: Goal: Ability to describe self-care measures that may prevent or decrease complications (Diabetes Survival Skills Education) will improve Outcome: Progressing Goal: Individualized Educational Video(s) Outcome: Progressing   Problem: Coping: Goal: Ability to adjust to condition or change in health will improve Outcome: Progressing   Problem: Fluid Volume: Goal: Ability to maintain a balanced intake and output will improve Outcome: Progressing   Problem: Health Behavior/Discharge Planning: Goal: Ability to identify and utilize available resources and services will improve Outcome: Progressing Goal: Ability to manage health-related needs will improve Outcome: Progressing   Problem: Metabolic: Goal: Ability to maintain  appropriate glucose levels will improve Outcome: Progressing   Problem: Nutritional: Goal: Maintenance of adequate nutrition will improve Outcome: Progressing Goal: Progress toward achieving an optimal weight will improve Outcome: Progressing   Problem: Skin Integrity: Goal: Risk for impaired skin integrity will decrease Outcome: Progressing   Problem: Tissue Perfusion: Goal: Adequacy of tissue perfusion will improve Outcome: Progressing

## 2023-04-17 NOTE — Anesthesia Procedure Notes (Signed)
Arterial Line Insertion Start/End11/25/2024 7:10 AM, 04/17/2023 7:20 AM Performed by: Sharyn Dross, CRNA, CRNA  Patient location: Pre-op. Preanesthetic checklist: patient identified, IV checked, site marked, risks and benefits discussed, surgical consent, monitors and equipment checked, pre-op evaluation, timeout performed and anesthesia consent Lidocaine 1% used for infiltration radial was placed Catheter size: 20 G Hand hygiene performed  and maximum sterile barriers used   Attempts: 2 Procedure performed using ultrasound guided technique. Following insertion, dressing applied. Post procedure assessment: normal and unchanged  Patient tolerated the procedure well with no immediate complications.

## 2023-04-17 NOTE — Sedation Documentation (Signed)
Handoff given to PACU RN at bedside.   Venous jugular site dressed with guaze and tegaderm remains clean, dry, and intact.  Venous femoral site dressed with guaze and tegaderm remains clean, dry, and intact.  Paracentesis site dressed with guaze and tegaderm remains clean, dry, and intact.  Thoracentesis site dressed guaze and tegaderm remains clean, dry, and intact.

## 2023-04-17 NOTE — Anesthesia Postprocedure Evaluation (Signed)
Anesthesia Post Note  Patient: Connor Vaughn  Procedure(s) Performed: TRANS-JUGULAR INTRAHEPATIC PORTAL SHUNT (TIPS)     Patient location during evaluation: PACU Anesthesia Type: General Level of consciousness: awake and alert Pain management: pain level controlled Vital Signs Assessment: post-procedure vital signs reviewed and stable Respiratory status: spontaneous breathing, nonlabored ventilation, respiratory function stable and patient connected to nasal cannula oxygen Cardiovascular status: blood pressure returned to baseline and stable Postop Assessment: no apparent nausea or vomiting Anesthetic complications: no  No notable events documented.  Last Vitals:  Vitals:   04/17/23 1415 04/17/23 1430  BP: 101/70 99/66  Pulse: 87 87  Resp: 16 14  Temp:    SpO2: 94% 94%    Last Pain:  Vitals:   04/17/23 1330  TempSrc:   PainSc: 0-No pain                 Moris Ratchford,W. EDMOND

## 2023-04-17 NOTE — Assessment & Plan Note (Signed)
04-17-2023 continue with levemir and SSI. 04-18-2023 continue to monitor.

## 2023-04-17 NOTE — Anesthesia Procedure Notes (Signed)
Procedure Name: Intubation Date/Time: 04/17/2023 9:10 AM  Performed by: Loleta Ladawn Boullion, CRNAPre-anesthesia Checklist: Patient identified, Patient being monitored, Timeout performed, Emergency Drugs available and Suction available Patient Re-evaluated:Patient Re-evaluated prior to induction Oxygen Delivery Method: Circle system utilized Preoxygenation: Pre-oxygenation with 100% oxygen Induction Type: IV induction Ventilation: Mask ventilation without difficulty Laryngoscope Size: Mac and 4 Grade View: Grade II Tube type: Oral Tube size: 7.5 mm Number of attempts: 1 Airway Equipment and Method: Stylet Placement Confirmation: ETT inserted through vocal cords under direct vision, positive ETCO2 and breath sounds checked- equal and bilateral Secured at: 23 cm Tube secured with: Tape Dental Injury: Teeth and Oropharynx as per pre-operative assessment

## 2023-04-18 ENCOUNTER — Encounter (HOSPITAL_COMMUNITY): Payer: Self-pay | Admitting: Interventional Radiology

## 2023-04-18 ENCOUNTER — Other Ambulatory Visit (HOSPITAL_COMMUNITY): Payer: Self-pay

## 2023-04-18 DIAGNOSIS — I350 Nonrheumatic aortic (valve) stenosis: Secondary | ICD-10-CM | POA: Diagnosis not present

## 2023-04-18 DIAGNOSIS — Z95828 Presence of other vascular implants and grafts: Secondary | ICD-10-CM | POA: Diagnosis not present

## 2023-04-18 DIAGNOSIS — K7031 Alcoholic cirrhosis of liver with ascites: Secondary | ICD-10-CM | POA: Diagnosis not present

## 2023-04-18 DIAGNOSIS — J948 Other specified pleural conditions: Secondary | ICD-10-CM | POA: Diagnosis not present

## 2023-04-18 LAB — CBC WITH DIFFERENTIAL/PLATELET
Abs Immature Granulocytes: 0.02 10*3/uL (ref 0.00–0.07)
Basophils Absolute: 0 10*3/uL (ref 0.0–0.1)
Basophils Relative: 0 %
Eosinophils Absolute: 0 10*3/uL (ref 0.0–0.5)
Eosinophils Relative: 1 %
HCT: 28.3 % — ABNORMAL LOW (ref 39.0–52.0)
Hemoglobin: 8.2 g/dL — ABNORMAL LOW (ref 13.0–17.0)
Immature Granulocytes: 0 %
Lymphocytes Relative: 12 %
Lymphs Abs: 0.6 10*3/uL — ABNORMAL LOW (ref 0.7–4.0)
MCH: 19.7 pg — ABNORMAL LOW (ref 26.0–34.0)
MCHC: 29 g/dL — ABNORMAL LOW (ref 30.0–36.0)
MCV: 67.9 fL — ABNORMAL LOW (ref 80.0–100.0)
Monocytes Absolute: 0.7 10*3/uL (ref 0.1–1.0)
Monocytes Relative: 14 %
Neutro Abs: 3.8 10*3/uL (ref 1.7–7.7)
Neutrophils Relative %: 73 %
Platelets: 106 10*3/uL — ABNORMAL LOW (ref 150–400)
RBC: 4.17 MIL/uL — ABNORMAL LOW (ref 4.22–5.81)
RDW: 19.4 % — ABNORMAL HIGH (ref 11.5–15.5)
WBC: 5.2 10*3/uL (ref 4.0–10.5)
nRBC: 0 % (ref 0.0–0.2)

## 2023-04-18 LAB — POCT I-STAT 7, (LYTES, BLD GAS, ICA,H+H)
Acid-Base Excess: 1 mmol/L (ref 0.0–2.0)
Acid-base deficit: 1 mmol/L (ref 0.0–2.0)
Bicarbonate: 25.9 mmol/L (ref 20.0–28.0)
Bicarbonate: 23.7 mmol/L (ref 20.0–28.0)
Calcium, Ion: 1.15 mmol/L (ref 1.15–1.40)
Calcium, Ion: 1.08 mmol/L — ABNORMAL LOW (ref 1.15–1.40)
HCT: 31 % — ABNORMAL LOW (ref 39.0–52.0)
HCT: 29 % — ABNORMAL LOW (ref 39.0–52.0)
Hemoglobin: 10.5 g/dL — ABNORMAL LOW (ref 13.0–17.0)
Hemoglobin: 9.9 g/dL — ABNORMAL LOW (ref 13.0–17.0)
O2 Saturation: 99 %
O2 Saturation: 99 %
Potassium: 3.5 mmol/L (ref 3.5–5.1)
Potassium: 3.8 mmol/L (ref 3.5–5.1)
Sodium: 139 mmol/L (ref 135–145)
Sodium: 140 mmol/L (ref 135–145)
TCO2: 27 mmol/L (ref 22–32)
TCO2: 25 mmol/L (ref 22–32)
pCO2 arterial: 39.4 mm[Hg] (ref 32–48)
pCO2 arterial: 38.9 mm[Hg] (ref 32–48)
pH, Arterial: 7.425 (ref 7.35–7.45)
pH, Arterial: 7.393 (ref 7.35–7.45)
pO2, Arterial: 127 mm[Hg] — ABNORMAL HIGH (ref 83–108)
pO2, Arterial: 152 mm[Hg] — ABNORMAL HIGH (ref 83–108)

## 2023-04-18 LAB — GLUCOSE, CAPILLARY
Glucose-Capillary: 227 mg/dL — ABNORMAL HIGH (ref 70–99)
Glucose-Capillary: 308 mg/dL — ABNORMAL HIGH (ref 70–99)
Glucose-Capillary: 210 mg/dL — ABNORMAL HIGH (ref 70–99)
Glucose-Capillary: 236 mg/dL — ABNORMAL HIGH (ref 70–99)
Glucose-Capillary: 242 mg/dL — ABNORMAL HIGH (ref 70–99)

## 2023-04-18 LAB — COMPREHENSIVE METABOLIC PANEL
ALT: 119 U/L — ABNORMAL HIGH (ref 0–44)
AST: 216 U/L — ABNORMAL HIGH (ref 15–41)
Albumin: 3 g/dL — ABNORMAL LOW (ref 3.5–5.0)
Alkaline Phosphatase: 101 U/L (ref 38–126)
Anion gap: 10 (ref 5–15)
BUN: 12 mg/dL (ref 6–20)
CO2: 23 mmol/L (ref 22–32)
Calcium: 8.5 mg/dL — ABNORMAL LOW (ref 8.9–10.3)
Chloride: 104 mmol/L (ref 98–111)
Creatinine, Ser: 1.09 mg/dL (ref 0.61–1.24)
GFR, Estimated: >60 mL/min (ref >60)
Glucose, Bld: 278 mg/dL — ABNORMAL HIGH (ref 70–99)
Potassium: 4.3 mmol/L (ref 3.5–5.1)
Sodium: 137 mmol/L (ref 135–145)
Total Bilirubin: 0.8 mg/dL (ref <1.2)
Total Protein: 5.4 g/dL — ABNORMAL LOW (ref 6.5–8.1)

## 2023-04-18 LAB — CULTURE, BODY FLUID W GRAM STAIN -BOTTLE: Culture: NO GROWTH

## 2023-04-18 LAB — PH, BODY FLUID: pH, Body Fluid: 7.5

## 2023-04-18 LAB — AMMONIA: Ammonia: 14 umol/L (ref 9–35)

## 2023-04-18 MED ORDER — RIFAXIMIN 550 MG PO TABS: 550.0000 mg | ORAL_TABLET | Freq: Two times a day (BID) | ORAL | 0 refills | Status: DC

## 2023-04-18 MED ORDER — LIVING WELL WITH DIABETES BOOK
Freq: Once | Status: AC
  Filled 2023-04-18: qty 1

## 2023-04-18 MED ORDER — INSULIN STARTER KIT- PEN NEEDLES (ENGLISH)
1.0000 | Freq: Once | Status: AC
  Administered 2023-04-18: 1
  Filled 2023-04-18 (×2): qty 1

## 2023-04-18 MED ORDER — SPIRONOLACTONE 25 MG PO TABS
100.0000 mg | ORAL_TABLET | Freq: Every day | ORAL | Status: DC
  Administered 2023-04-19: 100 mg via ORAL
  Filled 2023-04-18: qty 4

## 2023-04-18 MED ORDER — LACTULOSE 10 GM/15ML PO SOLN
10.0000 g | Freq: Three times a day (TID) | ORAL | Status: DC
  Administered 2023-04-18 – 2023-04-19 (×4): 10 g via ORAL
  Filled 2023-04-18 (×4): qty 15

## 2023-04-18 MED ORDER — FUROSEMIDE 20 MG PO TABS
20.0000 mg | ORAL_TABLET | Freq: Every day | ORAL | Status: DC
  Administered 2023-04-19: 20 mg via ORAL
  Filled 2023-04-18: qty 1

## 2023-04-18 NOTE — Progress Notes (Signed)
PROGRESS NOTE    Connor Vaughn  RUE:[id number] DOB: 02-09-1961 DOA: 04/12/2023 PCP: Ricky Stabs, NP-C  Subjective: Pt seen and examined.  Had TIPS yesterday.  Noticed a large SEM murmur on exam. Echo from 04-13-2023 shows moderate AS but report says "paradoxical moderate to severe low flow low gradient aortic stenosis".   BP running borderline low. Now that he has TIPS and has reduced portal pressure, may need to back off his diuretics. 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 Change his lasix and aldactone to once a day.  Still no BM yet after 2 doses of lactulose. He will need 2-3 BM per day to keep from getting hepatic encephalopathy given his TIPS.  NH3 today was 14   Hospital Course: HPI: Connor Vaughn is a 62 y.o. male with medical history significant of CKD 3A, diabetes, HCM status post ICD, NSVT, cirrhosis, hepatitis C, spinal stenosis with neurogenic, occasion presenting with shortness of breath and abdominal distention.   Patient presenting with recurrent shortness of breath and new abdominal distention.  Patient has known history of cirrhosis with recurrent hydrothorax and has undergone around 14 or 15 thoracenteses in the past.  He states he has never had a paracentesis.  Chart shows some history of ascites but never severe.  Patient follows up with GI in [place].  Additionally follows with interventional radiology for his recurrent thoracenteses. Has TIPS procedure planned by interventional radiology per chart review.   As above has had worsening shortness of breath and abdominal distention with shortness of breath being similar to previous and abdominal distention being new.   Denies fevers, chills, chest pain, abdominal pain, constipation, diarrhea, nausea, vomiting.   ED Course: Vital signs in the ED notable for blood pressure in the 130s to 180s systolic, heart rate in the 90s to 100s, respiratory rate in the teens to 20s.   Saturating okay on room air.  Does feel uncomfortable and short of breath and was not comfortable with the possibility of going home from the ED.  Lab workup included CMP with albumin 3.3, alk phos 161, glucose 315.  CBC with hemoglobin stable at 11.1, platelets stable at 112, mild leukopenia at 3.6.  Troponin normal.  BNP normal.  Lipase mildly elevated at 127.  Urinalysis with glucose, hemoglobin, ketones, protein.  Chest x-ray with recurrent right hydrothorax.  CT of the chest abdomen pelvis showed were large right pleural effusion, evidence of cirrhosis with portal hypertension, large volume ascites, right inguinal hernia, CAD.  No intervention in the ED thus far, plan is for IR evaluation.  Significant Events: Admitted 04/12/2023 for recurrent hydrothorax   Significant Labs: Admission WBC 3.6, BUN 6, scr 0.69 04-18-2023 NH3 14(day after TIPS)  Significant Imaging Studies: Admission CT chest/abd/pelvis Large right pleural effusion and associated atelectasis or consolidation. 2. Cirrhosis and portal hypertension. No focal liver lesion. 3. Large volume ascites throughout the  abdomen and pelvis, somewhat increased compared to prior examination. Anasarca. 4. Large, fluid containing right inguinal hernia. 5. Coronary artery disease. 04-13-2023 echo shows 1. Left ventricular ejection fraction, by estimation, is 60 to 65%. The  left ventricle has normal function. The left ventricle has no regional  wall motion abnormalities. Left ventricular diastolic parameters are  indeterminate.   2. Right ventricular systolic function is normal. The right ventricular  size is normal. There is normal pulmonary artery systolic pressure. The  estimated right ventricular systolic pressure is 29.2 mmHg.   3. The mitral  valve is normal in structure. Trivial mitral valve  regurgitation. No evidence of mitral stenosis.   4. The aortic valve is calcified. Aortic valve regurgitation is not  visualized. Moderate to severe aortic  valve stenosis. Aortic valve area,  by VTI measures 0.85 cm. Aortic valve mean gradient measures 23.6 mmHg.  Aortic valve Vmax measures 3.24 m/s.  Although the mean AVG and VMax are more c/w mild to moderate AS, the DVI is low at 0.25 and SVI low at 33. 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   5. The inferior vena cava is normal in size with greater than 50%  respiratory variability, suggesting right atrial pressure of 3 mmHg.    Antibiotic Therapy: Anti-infectives (From admission, onward)    Start     Dose/Rate Route Frequency Ordered Stop   04/17/23 1315  rifaximin (XIFAXAN) tablet 550 mg        550 mg Oral 2 times daily 04/17/23 1312     04/17/23 0800  cefTRIAXone (ROCEPHIN) 2 g in sodium chloride 0.9 % 100 mL IVPB       Note to Pharmacy: Will be administered in IR. Please do not administer on the nursing unit.   2 g 200 mL/hr over 30 Minutes Intravenous To Radiology 04/14/23 1627 04/17/23 0924       Procedures: 04-13-2023 right thoracentesis. 2.4 liter removed 04-14-2023 paracentesis 1.6 liters 04-17-2023 TIPS, right thoracentesis for 1.5 liter, paracentesis for 0.6 liters.  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 Had right thoracentesis on 04-13-2023. Pleural effusion returned quickly. Went for TIPS today and repeat right thora for another 1.5 liter 04-18-2023 stable. Will need to see if it returns after his TIPS  Moderate aortic valve stenosis - mean gradient 23.6 mmHg, peak gradient 41.9 mmHg . 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 Now that he has TIPS and has reduced portal pressure, may need to back off his diuretics. 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 Change his lasix and aldactone to once a day.  Will ask cards to review if he needs formal consult.  S/P TIPS  (transjugular intrahepatic portosystemic shunt) - on 04-17-2023. 04-17-2023 had TIPS today 04-18-2023 minor elevation in LFTs. IR is not concerned about this.  Pleural effusion associated with hepatic disorder 04-17-2023 recurrent hydrothorax. Had right thoracentesis on 04-13-2023. Pleural effusion returned quickly. Went for TIPS today and repeat right thora for another 1.5 liter 04-18-2023 stable. Will need to see if it returns after his TIPS  Portal hypertension (HCC) 04-17-2023 with TIPS today, Reduction of portal HTN with portosystemic gradient from 19 to 7   04-18-2023 will reduced lasix and aldactone to qday. Hopefully with decreased portal pressure, he won't develop ascites anymore. Plt count decreasing. Will stop lovenox.  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 Had right thoracentesis on 04-13-2023. Pleural effusion returned quickly. Went for TIPS today and repeat right thora for another 1.5 liter  04-18-2023 stable. Will need to see if it returns after his TIPS  Alcoholic cirrhosis of liver with ascites (HCC) 04-17-2023 had TIPS today. Need to monitor for acute hepatic encephalopathy. Starting lactulose and xifaxan. Monitor NH3  04-18-2023 still no BM after 2 doses of lactulose. Will increase to TID lactulose. Targeting 3 Bms per day. Continue Xifaxan. Monitoring for acute hepatic encephalopathy after his TIPS. Repeat NH3 in AM.  DNR (  do not resuscitate)/DNI(Do Not Intubate) 04-17-2023 chronic.  ICD (implantable cardioverter-defibrillator) in place 04-17-2023 stable. Has HCOM  Hypertrophic cardiomyopathy (HCC) 04-17-2023 stable. Has ICD. 04-18-2023 stable. Has ICD  DM (diabetes mellitus), type 2 with complications (HCC) 04-17-2023 continue with levemir and SSI. 04-18-2023 continue to monitor.  CKD stage 3a, GFR 45-59 ml/min (HCC) 04-17-2023 monitor Scr. With TIPS in now, will reduce lasix to 20 mg bid and aldactone to 100  mg bid(was 40 mg bid and 200 mg bid). Monitor Scr.  04-18-2023 given potential findings of "low flow state" in his moderate to severe AS, will reduce diuretics to just qday. May need to stop them all together. Monitor Scr.   DVT prophylaxis: Place and maintain sequential compression device Start: 04/18/23 1137    Code Status: Limited: Do not attempt resuscitation (DNR) -DNR-LIMITED -Do Not Intubate/DNI  Family Communication: no family at bedside Disposition Plan: return home Reason for continuing need for hospitalization: monitoring NH3 after TIPs.  Objective: Vitals:   04/17/23 1613 04/17/23 2034 04/18/23 0511 04/18/23 0731  BP: 98/67 96/64 106/69 104/76  Pulse: 94 97 95 92  Resp: 18 18 20 18   Temp: 97.6 F (36.4 C) (!) 97.4 F (36.3 C) 98.3 F (36.8 C) 98.1 F (36.7 C)  TempSrc: Oral Oral Oral Oral  SpO2: 98% 98% 99% 99%  Weight:      Height:        Intake/Output Summary (Last 24 hours) at 04/18/2023 1139 Last data filed at 04/18/2023 0700 Gross per 24 hour  Intake 1910 ml  Output 350 ml  Net 1560 ml   Filed Weights   04/12/23 1138 04/13/23 0500 04/15/23 0500  Weight: 70.7 kg 70.7 kg 70.7 kg    Examination:  Physical Exam Vitals and nursing note reviewed.  Constitutional:      General: He is not in acute distress.    Appearance: He is not ill-appearing, toxic-appearing or diaphoretic.  HENT:     Head: Normocephalic and atraumatic.  Eyes:     General: No scleral icterus. Cardiovascular:     Rate and Rhythm: Normal rate and regular rhythm.     Pulses: Normal pulses.     Heart sounds: Murmur heard.  Pulmonary:     Effort: Pulmonary effort is normal.     Breath sounds: Normal breath sounds.  Abdominal:     General: Abdomen is flat. Bowel sounds are normal.     Palpations: Abdomen is soft.  Musculoskeletal:     Right lower leg: No edema.     Left lower leg: No edema.  Skin:    General: Skin is warm and dry.     Capillary Refill: Capillary refill takes  less than 2 seconds.  Neurological:     General: No focal deficit present.     Mental Status: He is alert and oriented to person, place, and time.     Data Reviewed: I have personally reviewed following labs and imaging studies  CBC: Recent Labs  Lab 04/13/23 0419 04/14/23 0547 04/15/23 0858 04/17/23 1007 04/17/23 1213 04/17/23 1636 04/18/23 0419  WBC 2.4* 2.1* 2.8*  --   --  3.5* 5.2  NEUTROABS  --  1.2* 1.7  --   --   --  3.8  HGB 8.5* 7.6* 8.7* 10.5* 9.9* 8.8* 8.2*  HCT 29.3* 25.8* 29.8* 31.0* 29.0* 30.8* 28.3*  MCV 69.1* 67.7* 67.6*  --   --  68.3* 67.9*  PLT 87* 76* 99*  --   --  111* 106*  Basic Metabolic Panel: Recent Labs  Lab 04/13/23 1347 04/14/23 0547 04/15/23 0858 04/17/23 1007 04/17/23 1213 04/17/23 1636 04/18/23 0419  NA 133* 132* 132* 139 140 136 137  K 4.6 3.5 4.1 3.5 3.8 4.0 4.3  CL 100 102 99  --   --  103 104  CO2 28 25 27   --   --  24 23  GLUCOSE 431* 221* 236*  --   --  204* 278*  BUN 12 11 14   --   --  11 12  CREATININE 0.87 0.72 0.84  --   --  0.87 1.09  CALCIUM 8.3* 8.3* 8.8*  --   --  8.1* 8.5*   GFR: Estimated Creatinine Clearance: 65 mL/min (by C-G formula based on SCr of 1.09 mg/dL). Liver Function Tests: Recent Labs  Lab 04/13/23 1347 04/14/23 0547 04/15/23 0858 04/17/23 1636 04/18/23 0419  AST 32 23 26 38 216*  ALT 21 18 17 22  119*  ALKPHOS 122 98 102 90 101  BILITOT 0.4 1.0 0.5 0.8 0.8  PROT 5.8* 5.5* 6.2* 6.2* 5.4*  ALBUMIN 2.5* 2.9* 3.0* 3.5 3.0*   Recent Labs  Lab 04/12/23 1220  LIPASE 127*   Recent Labs  Lab 04/13/23 1045 04/18/23 0419  AMMONIA 17 14   Coagulation Profile: Recent Labs  Lab 04/12/23 1220 04/13/23 0419 04/17/23 0356  INR 1.0 1.1 1.1   BNP (last 3 results) Recent Labs    11/02/22 1157 01/24/23 1917 04/12/23 1220  BNP 27.3 40.0 59.5   CBG: Recent Labs  Lab 04/17/23 1304 04/17/23 1623 04/17/23 2038 04/18/23 0712 04/18/23 1129  GLUCAP 119* 182* 286* 227* 308*    Recent  Results (from the past 240 hour(s))  SARS Coronavirus 2 by RT PCR (hospital order, performed in [facility] hospital lab) *cepheid single result test* Anterior Nasal Swab     Status: None   Collection Time: 04/13/23  2:21 AM   Specimen: Anterior Nasal Swab  Result Value Ref Range Status   SARS Coronavirus 2 by RT PCR NEGATIVE NEGATIVE Final    Comment: Performed at [facility], [address] [place], [place] [address]  Gram stain     Status: None   Collection Time: 04/13/23 11:54 AM   Specimen: Lung, Right; Pleural Fluid  Result Value Ref Range Status   Specimen Description PLEURAL  Final   Special Requests right lung  Final   Gram Stain   Final    WBC PRESENT, PREDOMINANTLY MONONUCLEAR NO ORGANISMS SEEN CYTOSPIN SMEAR Performed at [facility], [address] [place], [place] [address]    Report Status 04/13/2023 FINAL  Final  Acid Fast Smear (AFB)     Status: None   Collection Time: 04/13/23 11:54 AM   Specimen: Lung, Right; Pleural Fluid  Result Value Ref Range Status   AFB Specimen Processing Concentration  Final   Acid Fast Smear Negative  Final    Comment: (NOTE) Performed At: [facility] [address] [place], [place] [id number] Jolene Schimke MD QM:[id number]    Source (AFB) PLEURAL  Final    Comment: Performed at [facility], [address] [place], [place] [address]  Culture, body fluid w Gram Stain-bottle     Status: None   Collection Time: 04/13/23 11:54 AM   Specimen: Pleura  Result Value Ref Range Status   Specimen Description PLEURAL  Final   Special Requests right lung  Final   Culture   Final  NO GROWTH 5 DAYS Performed at [facility], [address], [place], [place] [address]    Report Status 04/18/2023 FINAL  Final  Culture, body fluid w Gram Stain-bottle     Status: None (Preliminary result)   Collection Time: 04/14/23 10:50 AM   Specimen: Peritoneal Washings  Result Value Ref Range Status   Specimen  Description PERITONEAL  Final   Special Requests NONE  Final   Culture   Final    NO GROWTH 4 DAYS Performed at [facility], [address], [place], [place] [address]    Report Status PENDING  Incomplete  Gram stain     Status: None   Collection Time: 04/14/23 10:50 AM   Specimen: Peritoneal Washings  Result Value Ref Range Status   Specimen Description PERITONEAL  Final   Special Requests NONE  Final   Gram Stain   Final    WBC PRESENT, PREDOMINANTLY MONONUCLEAR NO ORGANISMS SEEN CYTOSPIN SMEAR Performed at [facility], [address] [place], [place] [address]    Report Status 04/14/2023 FINAL  Final     ECHO: 04-13-2023 1. Left ventricular ejection fraction, by estimation, is 60 to 65%. The  left ventricle has normal function. The left ventricle has no regional  wall motion abnormalities. Left ventricular diastolic parameters are  indeterminate.   2. Right ventricular systolic function is normal. The right ventricular  size is normal. There is normal pulmonary artery systolic pressure. The  estimated right ventricular systolic pressure is 29.2 mmHg.   3. The mitral valve is normal in structure. Trivial mitral valve  regurgitation. No evidence of mitral stenosis.   4. The aortic valve is calcified. Aortic valve regurgitation is not  visualized. Moderate to severe aortic valve stenosis. Aortic valve area,  by VTI measures 0.85 cm. Aortic valve mean gradient measures 23.6 mmHg.  Aortic valve Vmax measures 3.24 m/s.  Although the mean AVG and VMax are more c/w mild to moderate AS, the DVI  is low at 0.25 and SVI low at 33. Findings most consistent with  paradoxical moderate to severe low flow low gradient aortic stenosis.   5. The inferior vena cava is normal in size with greater than 50%  respiratory variability, suggesting right atrial pressure of 3 mmHg.    Radiology Studies: IR Tips  Result Date: 04/17/2023 CLINICAL DATA:  Cirrhosis Briefly,  62 year old male with a history of decompensated EtOH cirrhosis and refractory RIGHT hepatic hydrothorax. MELD 9 (01/27/2023). Child Pugh class B, 7 pts. FIPS prognostication favorable. EXAM: Procedures: 1. TRANSJUGULAR INTRAHEPATIC PORTOSYSTEMIC SHUNT (TIPS) 2. INTRACARDIAC ECHOCARDIOGRAPHY (ICE) 3.  GASTRO-ESOPHAGEAL VARICEAL EMBOLIZATION 4.  THERAPEUTIC RIGHT THORACENTESIS and PARACENTESIS MEDICATIONS: As antibiotic prophylaxis, Rocephin 2 gm IV was ordered pre-procedure and administered intravenously within one hour of incision. ANESTHESIA/SEDATION: Sedation by the Anesthesia Team was performed. Please see anesthesiology log for details. CONTRAST:  80 mL Omnipaque 300, intravenous FLUOROSCOPY TIME:  Fluoroscopic dose; 320 mGy COMPLICATIONS: None immediate. PROCEDURE: Informed written consent was obtained from the patient and/or patient's representative after a thorough discussion of the procedural risks, benefits and alternatives. All questions were addressed. Maximal Sterile Barrier Technique was utilized including caps, mask, sterile gowns, sterile gloves, sterile drape, hand hygiene and skin antiseptic. A timeout was performed prior to the initiation of the procedure. RIGHT THORACENTESIS Initial ultrasound scanning demonstrates a large amount of pleural effusion within the RIGHT chest. Under direct ultrasound guidance, a 6 Fr pigtail catheter was introduced. An ultrasound image was  saved for documentation purposes. Therapeutic RIGHT thoracentesis was performed. PARACENTESIS Additional ultrasound scanning of the abdomen demonstrated a small volume of ascites. Under direct ultrasound guidance, a 7 Fr pigtail catheter was introduced. An ultrasound image was saved for documentation purposes. Therapeutic paracentesis was performed. INTRACARDIAC ECHOCARDIOGRAPHY (ICE) A preliminary ultrasound of the RIGHT groin was performed and demonstrates a patent greater saphenous vein. A permanent ultrasound image was  recorded. Using a combination of fluoroscopy and ultrasound, an access site was determined. A small dermatotomy was made at the planned puncture site. Using ultrasound guidance, access into the RIGHT greater saphenous vein was obtained with visualization of needle entry into the vessel using a standard micropuncture technique. A wire was advanced into the IVC insert all fascial dilation performed. An 8 Fr, 10 cm vascular sheath was placed into the external iliac vein. Through this access site, an 8 Fr AcuNav ICE catheter was advanced with ease under fluoroscopic guidance to the level of the intrahepatic inferior vena cava. TRANSJUGULAR INTRAHEPATIC PORTOSYSTEMIC SHUNT (TIPS) A preliminary ultrasound of the RIGHT neck was performed and demonstrates a patent internal jugular vein. A permanent ultrasound image was recorded. Using a combination of fluoroscopy and ultrasound, an access site was determined. A small dermatotomy was made at the planned puncture site. Using ultrasound guidance, access into the RIGHT internal jugular vein was obtained with visualization of needle entry into the vessel using a standard micropuncture technique. A wire was advanced into the IVC and serial fascial dilation performed. A 10 Fr sheath was placed into the internal jugular vein and advanced to the IVC. The jugular sheath was retracted into the RIGHT atrium and manometry was performed. A 5 Fr angled tip catheter was then directed into the RIGHT hepatic vein. Hepatic venogram was performed. These images demonstrated patent hepatic vein with no stenosis. The catheter was advanced to a wedge portion of the a patent vein over which the sheath was advanced into the distal RIGHT hepatic vein. Using ICE ultrasound visualization the catheter as RIGHT hepatic vein as well as the portal anatomy was defined. A planned exit site from the hepatic vein and puncture site from the portal vein was placed into a single sonographic plane. Under direct  ultrasound visualization, the Argon Scorpion X needle was advanced into the central RIGHT portal vein. Hand injection of contrast confirmed position within the portal system. A Glidewire was then advanced into the superior mesenteric vein. The tract was pre dilated with an 8 mm balloon. A 5 Fr marking pigtail catheter was then advanced over the wire into the main portal vein and wire removed. Portal venogram was performed which demonstrated a patent portal vein. 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 Portal manometry was then performed. The tract was then dilated to 8 mm with an 8 mm x 8 cm Athletis balloon. A 8-10 mm by 7 + 2 cm of GORE Viatorr TIPS stent was placed. Interval portal venogram with residual filling of the gastroesophageal varices. GASTRO-ESOPHAGEAL VARICEAL EMBOLIZATION Access was obtained into the gastroesophageal varix with an angled catheter, initial embolization attempt with a 8 mm AVP 4 Amplatzer plug was complicated by incomplete deployment then the plug partially embolized to the TIPS stent in an attempt at retraction. The plug was snared and removed. Access was obtained into the varix, then using a new 7 mm AVP 4 Amplatzer plug the varix was successfully embolized. Completion portal venogram demonstrates a patent TIPS endograft without significant residual filling of the  varices. RIGHT atrial and portal pressures were obtained after shunt placement. The RIGHT thoracentesis and paracentesis drains, catheters and sheath were removed and manual compression was applied to the RIGHT internal jugular and greater saphenous venous access sites until hemostasis was achieved. The patient was transferred to the PACU in stable condition. Pre-TIPS Mean Pressures (mmHg): Right atrium: 8 Portal vein: 27 Portosystemic gradient: 19 Post-TIPS Mean Pressures (mmHg): Right atrium:14 Portal vein: 21 Portosystemic gradient: 7 IMPRESSION: 1. Access  via the RIGHT jugular and greater saphenous veins. 2. Successful ICE-guided TIPS, via the RIGHT hepatic vein to the RIGHT portal vein, with placement of a 7+2 cm GORE Viatorr stent. 3. Reduction of portosystemic gradient from 19 to 7 mmHg 4. Successful gastroesophageal varix embolization with a single 7 mm AVP-4 Amplatzer plug. 5. Therapeutic RIGHT thoracentesis and paracentesis, with 1.5 L of serous pleural fluid and 0.6 L of ascites removed respectively. PLAN: The patient is enrolled in the [facility] portal hypertension follow-up clinic, and will be closely followed per the protocol. Roanna Banning, MD [facility] Electronically Signed   By: Roanna Banning M.D.   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 MELD 9 (01/27/2023). Child Pugh class B, 7 pts. FIPS prognostication favorable. EXAM: Procedures: 1. TRANSJUGULAR INTRAHEPATIC PORTOSYSTEMIC SHUNT (TIPS) 2. INTRACARDIAC ECHOCARDIOGRAPHY (ICE) 3.  GASTRO-ESOPHAGEAL VARICEAL EMBOLIZATION 4.  THERAPEUTIC RIGHT THORACENTESIS and PARACENTESIS MEDICATIONS: As antibiotic prophylaxis, Rocephin 2 gm IV was ordered pre-procedure and administered intravenously within one hour of incision. ANESTHESIA/SEDATION: Sedation by the Anesthesia Team was performed. Please see anesthesiology log for details. CONTRAST:  80 mL Omnipaque 300, intravenous FLUOROSCOPY TIME:  Fluoroscopic dose; 320 mGy COMPLICATIONS: None immediate. PROCEDURE: Informed written consent was obtained from the patient and/or patient's representative after a thorough discussion of the procedural risks, benefits and alternatives. All questions were addressed. Maximal Sterile Barrier Technique was utilized including caps, mask, sterile gowns, sterile gloves, sterile drape, hand hygiene and skin  antiseptic. A timeout was performed prior to the initiation of the procedure. RIGHT THORACENTESIS Initial ultrasound scanning demonstrates a large amount of pleural effusion within the RIGHT chest. Under direct ultrasound guidance, a 6 Fr pigtail catheter was introduced. An ultrasound image was saved for documentation purposes. Therapeutic RIGHT thoracentesis was performed. PARACENTESIS Additional ultrasound scanning of the abdomen demonstrated a small volume of ascites. Under direct ultrasound guidance, a 7 Fr pigtail catheter was introduced. An ultrasound image was saved for documentation purposes. Therapeutic paracentesis was performed. INTRACARDIAC ECHOCARDIOGRAPHY (ICE) A preliminary ultrasound of the RIGHT groin was performed and demonstrates a patent greater saphenous vein. A permanent ultrasound image was recorded. Using a combination of fluoroscopy and ultrasound, an access site was determined. A small dermatotomy was made at the planned puncture site. Using ultrasound guidance, access into the RIGHT greater saphenous vein was obtained with visualization of needle entry into the vessel using a standard micropuncture technique. A wire was advanced into the IVC insert all fascial dilation performed. An 8 Fr, 10 cm vascular sheath was placed into the external iliac vein. Through this access site, an 8 Fr AcuNav ICE catheter was advanced with ease under fluoroscopic guidance to the level of the intrahepatic inferior vena cava. TRANSJUGULAR INTRAHEPATIC PORTOSYSTEMIC SHUNT (TIPS) A preliminary ultrasound of the RIGHT neck was performed and demonstrates a patent internal jugular vein. A permanent ultrasound image was recorded. Using  a combination of fluoroscopy and ultrasound, an access site was determined. A small dermatotomy was made at the planned puncture site. Using ultrasound guidance, access into the RIGHT internal jugular vein was obtained with visualization of needle entry into the vessel using a  standard micropuncture technique. A wire was advanced into the IVC and serial fascial dilation performed. A 10 Fr sheath was placed into the internal jugular vein and advanced to the IVC. The jugular sheath was retracted into the RIGHT atrium and manometry was performed. A 5 Fr angled tip catheter was then directed into the RIGHT hepatic vein. Hepatic venogram was performed. These images demonstrated patent hepatic vein with no stenosis. The catheter was advanced to a wedge portion of the a patent vein over which the sheath was advanced into the distal RIGHT hepatic vein. Using ICE ultrasound visualization the catheter as RIGHT hepatic vein as well as the portal anatomy was defined. A planned exit site from the hepatic vein and puncture site from the portal vein was placed into a single sonographic plane. Under direct ultrasound visualization, the Argon Scorpion X needle was advanced into the central RIGHT portal vein. Hand injection of contrast confirmed position within the portal system. A Glidewire was then advanced into the superior mesenteric vein. The tract was pre dilated with an 8 mm balloon. A 5 Fr marking pigtail catheter was then advanced over the wire into the main portal vein and wire removed. Portal venogram was performed which demonstrated a patent portal vein. 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 Portal manometry was then performed. The tract was then dilated to 8 mm with an 8 mm x 8 cm Athletis balloon. A 8-10 mm by 7 + 2 cm of GORE Viatorr TIPS stent was placed. Interval portal venogram with residual filling of the gastroesophageal varices. GASTRO-ESOPHAGEAL VARICEAL EMBOLIZATION Access was obtained into the gastroesophageal varix with an angled catheter, initial embolization attempt with a 8 mm AVP 4 Amplatzer plug was complicated by incomplete deployment then the plug partially embolized to the TIPS stent in an attempt at  retraction. The plug was snared and removed. Access was obtained into the varix, then using a new 7 mm AVP 4 Amplatzer plug the varix was successfully embolized. Completion portal venogram demonstrates a patent TIPS endograft without significant residual filling of the varices. RIGHT atrial and portal pressures were obtained after shunt placement. The RIGHT thoracentesis and paracentesis drains, catheters and sheath were removed and manual compression was applied to the RIGHT internal jugular and greater saphenous venous access sites until hemostasis was achieved. The patient was transferred to the PACU in stable condition. Pre-TIPS Mean Pressures (mmHg): Right atrium: 8 Portal vein: 27 Portosystemic gradient: 19 Post-TIPS Mean Pressures (mmHg): Right atrium:14 Portal vein: 21 Portosystemic gradient: 7 IMPRESSION: 1. Access via the RIGHT jugular and greater saphenous veins. 2. Successful ICE-guided TIPS, via the RIGHT hepatic vein to the RIGHT portal vein, with placement of a 7+2 cm GORE Viatorr stent. 3. Reduction of portosystemic gradient from 19 to 7 mmHg 4. Successful gastroesophageal varix embolization with a single 7 mm AVP-4 Amplatzer plug. 5. Therapeutic RIGHT thoracentesis and paracentesis, with 1.5 L of serous pleural fluid and 0.6 L of ascites removed respectively. PLAN: The patient is enrolled in the [facility] portal hypertension follow-up clinic, and will be closely followed per the protocol. Roanna Banning, MD [facility] Radiology Electronically Signed   By: Roanna Banning M.D.   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 MELD 9 (01/27/2023). Child Pugh class B, 7 pts. FIPS prognostication favorable. EXAM: Procedures: 1. TRANSJUGULAR INTRAHEPATIC PORTOSYSTEMIC SHUNT  (TIPS) 2. INTRACARDIAC ECHOCARDIOGRAPHY (ICE) 3.  GASTRO-ESOPHAGEAL VARICEAL EMBOLIZATION 4.  THERAPEUTIC RIGHT THORACENTESIS and PARACENTESIS MEDICATIONS: As antibiotic prophylaxis, Rocephin 2 gm IV was ordered pre-procedure and administered intravenously within one hour of incision. ANESTHESIA/SEDATION: Sedation by the Anesthesia Team was performed. Please see anesthesiology log for details. CONTRAST:  80 mL Omnipaque 300, intravenous FLUOROSCOPY TIME:  Fluoroscopic dose; 320 mGy COMPLICATIONS: None immediate. PROCEDURE: Informed written consent was obtained from the patient and/or patient's representative after a thorough discussion of the procedural risks, benefits and alternatives. All questions were addressed. Maximal Sterile Barrier Technique was utilized including caps, mask, sterile gowns, sterile gloves, sterile drape, hand hygiene and skin antiseptic. A timeout was performed prior to the initiation of the procedure. RIGHT THORACENTESIS Initial ultrasound scanning demonstrates a large amount of pleural effusion within the RIGHT chest. Under direct ultrasound guidance, a 6 Fr pigtail catheter was introduced. An ultrasound image was saved for documentation purposes. Therapeutic RIGHT thoracentesis was performed. PARACENTESIS Additional ultrasound scanning of the abdomen demonstrated a small volume of ascites. Under direct ultrasound guidance, a 7 Fr pigtail catheter was introduced. An ultrasound image was saved for documentation purposes. Therapeutic paracentesis was performed. INTRACARDIAC ECHOCARDIOGRAPHY (ICE) A preliminary ultrasound of the RIGHT groin was performed and demonstrates a patent greater saphenous vein. A permanent ultrasound image was recorded. Using a combination of fluoroscopy and ultrasound, an access site was determined. A small dermatotomy was made at the planned puncture site. Using ultrasound guidance, access into the RIGHT greater saphenous vein was obtained with visualization of  needle entry into the vessel using a standard micropuncture technique. A wire was advanced into the IVC insert all fascial dilation performed. An 8 Fr, 10 cm vascular sheath was placed into the external iliac vein. Through this access site, an 8 Fr AcuNav ICE catheter was advanced with ease under fluoroscopic guidance to the level of the intrahepatic inferior vena cava. TRANSJUGULAR INTRAHEPATIC PORTOSYSTEMIC SHUNT (TIPS) A preliminary ultrasound of the RIGHT neck was performed and demonstrates a patent internal jugular vein. A permanent ultrasound image was recorded. Using a combination of fluoroscopy and ultrasound, an access site was determined. A small dermatotomy was made at the planned puncture site. Using ultrasound guidance, access into the RIGHT internal jugular vein was obtained with visualization of needle entry into the vessel using a standard micropuncture technique. A wire was advanced into the IVC and serial fascial dilation performed. A 10 Fr sheath was placed into the internal jugular vein and advanced to the IVC. The jugular sheath was retracted into the RIGHT atrium and manometry was performed. A 5 Fr angled tip catheter was then directed into the RIGHT hepatic vein. Hepatic venogram was performed. These images demonstrated patent hepatic vein with no stenosis. The catheter was advanced to a wedge portion of the a patent vein over which the sheath was advanced into the distal RIGHT hepatic vein. Using ICE ultrasound visualization the catheter as RIGHT hepatic vein as well as the portal anatomy was defined. A planned exit site from the hepatic vein and puncture site from the portal vein was placed into a single sonographic plane. Under direct ultrasound visualization, the Argon Scorpion X needle was advanced into the  central RIGHT portal vein. Hand injection of contrast confirmed position within the portal system. A Glidewire was then advanced into the superior mesenteric vein. The tract was pre  dilated with an 8 mm balloon. A 5 Fr marking pigtail catheter was then advanced over the wire into the main portal vein and wire removed. Portal venogram was performed which demonstrated a patent portal vein. 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 Portal manometry was then performed. The tract was then dilated to 8 mm with an 8 mm x 8 cm Athletis balloon. A 8-10 mm by 7 + 2 cm of GORE Viatorr TIPS stent was placed. Interval portal venogram with residual filling of the gastroesophageal varices. GASTRO-ESOPHAGEAL VARICEAL EMBOLIZATION Access was obtained into the gastroesophageal varix with an angled catheter, initial embolization attempt with a 8 mm AVP 4 Amplatzer plug was complicated by incomplete deployment then the plug partially embolized to the TIPS stent in an attempt at retraction. The plug was snared and removed. Access was obtained into the varix, then using a new 7 mm AVP 4 Amplatzer plug the varix was successfully embolized. Completion portal venogram demonstrates a patent TIPS endograft without significant residual filling of the varices. RIGHT atrial and portal pressures were obtained after shunt placement. The RIGHT thoracentesis and paracentesis drains, catheters and sheath were removed and manual compression was applied to the RIGHT internal jugular and greater saphenous venous access sites until hemostasis was achieved. The patient was transferred to the PACU in stable condition. Pre-TIPS Mean Pressures (mmHg): Right atrium: 8 Portal vein: 27 Portosystemic gradient: 19 Post-TIPS Mean Pressures (mmHg): Right atrium:14 Portal vein: 21 Portosystemic gradient: 7 IMPRESSION: 1. Access via the RIGHT jugular and greater saphenous veins. 2. Successful ICE-guided TIPS, via the RIGHT hepatic vein to the RIGHT portal vein, with placement of a 7+2 cm GORE Viatorr stent. 3. Reduction of portosystemic gradient from 19 to 7 mmHg 4. Successful  gastroesophageal varix embolization with a single 7 mm AVP-4 Amplatzer plug. 5. Therapeutic RIGHT thoracentesis and paracentesis, with 1.5 L of serous pleural fluid and 0.6 L of ascites removed respectively. PLAN: The patient is enrolled in the [facility] portal hypertension follow-up clinic, and will be closely followed per the protocol. Roanna Banning, MD [facility] Radiology Electronically Signed   By: Roanna Banning M.D.   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 MELD 9 (01/27/2023). Child Pugh class B, 7 pts. FIPS prognostication favorable. EXAM: Procedures: 1. TRANSJUGULAR INTRAHEPATIC PORTOSYSTEMIC SHUNT (TIPS) 2. INTRACARDIAC ECHOCARDIOGRAPHY (ICE) 3.  GASTRO-ESOPHAGEAL VARICEAL EMBOLIZATION 4.  THERAPEUTIC RIGHT THORACENTESIS and PARACENTESIS MEDICATIONS: As antibiotic prophylaxis, Rocephin 2 gm IV was ordered pre-procedure and administered intravenously within one hour of incision. ANESTHESIA/SEDATION: Sedation by the Anesthesia Team was performed. Please see anesthesiology log for details. CONTRAST:  80 mL Omnipaque 300, intravenous FLUOROSCOPY TIME:  Fluoroscopic dose; 320 mGy COMPLICATIONS: None immediate. PROCEDURE: Informed written consent was obtained from the patient and/or patient's representative after a thorough discussion of the procedural risks, benefits and alternatives. All questions were addressed. Maximal Sterile Barrier Technique was utilized including caps, mask, sterile gowns, sterile gloves, sterile drape, hand hygiene and skin antiseptic. A timeout was performed prior to the initiation of the procedure. RIGHT THORACENTESIS Initial ultrasound scanning demonstrates a large amount of pleural effusion within the RIGHT  chest. Under direct ultrasound guidance, a 6 Fr  pigtail catheter was introduced. An ultrasound image was saved for documentation purposes. Therapeutic RIGHT thoracentesis was performed. PARACENTESIS Additional ultrasound scanning of the abdomen demonstrated a small volume of ascites. Under direct ultrasound guidance, a 7 Fr pigtail catheter was introduced. An ultrasound image was saved for documentation purposes. Therapeutic paracentesis was performed. INTRACARDIAC ECHOCARDIOGRAPHY (ICE) A preliminary ultrasound of the RIGHT groin was performed and demonstrates a patent greater saphenous vein. A permanent ultrasound image was recorded. Using a combination of fluoroscopy and ultrasound, an access site was determined. A small dermatotomy was made at the planned puncture site. Using ultrasound guidance, access into the RIGHT greater saphenous vein was obtained with visualization of needle entry into the vessel using a standard micropuncture technique. A wire was advanced into the IVC insert all fascial dilation performed. An 8 Fr, 10 cm vascular sheath was placed into the external iliac vein. Through this access site, an 8 Fr AcuNav ICE catheter was advanced with ease under fluoroscopic guidance to the level of the intrahepatic inferior vena cava. TRANSJUGULAR INTRAHEPATIC PORTOSYSTEMIC SHUNT (TIPS) A preliminary ultrasound of the RIGHT neck was performed and demonstrates a patent internal jugular vein. A permanent ultrasound image was recorded. Using a combination of fluoroscopy and ultrasound, an access site was determined. A small dermatotomy was made at the planned puncture site. Using ultrasound guidance, access into the RIGHT internal jugular vein was obtained with visualization of needle entry into the vessel using a standard micropuncture technique. A wire was advanced into the IVC and serial fascial dilation performed. A 10 Fr sheath was placed into the internal jugular vein and advanced to the IVC. The jugular sheath was retracted into the RIGHT atrium and  manometry was performed. A 5 Fr angled tip catheter was then directed into the RIGHT hepatic vein. Hepatic venogram was performed. These images demonstrated patent hepatic vein with no stenosis. The catheter was advanced to a wedge portion of the a patent vein over which the sheath was advanced into the distal RIGHT hepatic vein. Using ICE ultrasound visualization the catheter as RIGHT hepatic vein as well as the portal anatomy was defined. A planned exit site from the hepatic vein and puncture site from the portal vein was placed into a single sonographic plane. Under direct ultrasound visualization, the Argon Scorpion X needle was advanced into the central RIGHT portal vein. Hand injection of contrast confirmed position within the portal system. A Glidewire was then advanced into the superior mesenteric vein. The tract was pre dilated with an 8 mm balloon. A 5 Fr marking pigtail catheter was then advanced over the wire into the main portal vein and wire removed. Portal venogram was performed which demonstrated a patent portal vein. 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 Portal manometry was then performed. The tract was then dilated to 8 mm with an 8 mm x 8 cm Athletis balloon. A 8-10 mm by 7 + 2 cm of GORE Viatorr TIPS stent was placed. Interval portal venogram with residual filling of the gastroesophageal varices. GASTRO-ESOPHAGEAL VARICEAL EMBOLIZATION Access was obtained into the gastroesophageal varix with an angled catheter, initial embolization attempt with a 8 mm AVP 4 Amplatzer plug was complicated by incomplete deployment then the plug partially embolized to the TIPS stent in an attempt at retraction. The plug was snared and removed. Access was obtained into the varix, then using a new 7 mm AVP 4 Amplatzer plug the varix was  successfully embolized. Completion portal venogram demonstrates a patent TIPS endograft without significant  residual filling of the varices. RIGHT atrial and portal pressures were obtained after shunt placement. The RIGHT thoracentesis and paracentesis drains, catheters and sheath were removed and manual compression was applied to the RIGHT internal jugular and greater saphenous venous access sites until hemostasis was achieved. The patient was transferred to the PACU in stable condition. Pre-TIPS Mean Pressures (mmHg): Right atrium: 8 Portal vein: 27 Portosystemic gradient: 19 Post-TIPS Mean Pressures (mmHg): Right atrium:14 Portal vein: 21 Portosystemic gradient: 7 IMPRESSION: 1. Access via the RIGHT jugular and greater saphenous veins. 2. Successful ICE-guided TIPS, via the RIGHT hepatic vein to the RIGHT portal vein, with placement of a 7+2 cm GORE Viatorr stent. 3. Reduction of portosystemic gradient from 19 to 7 mmHg 4. Successful gastroesophageal varix embolization with a single 7 mm AVP-4 Amplatzer plug. 5. Therapeutic RIGHT thoracentesis and paracentesis, with 1.5 L of serous pleural fluid and 0.6 L of ascites removed respectively. PLAN: The patient is enrolled in the [facility] portal hypertension follow-up clinic, and will be closely followed per the protocol. Roanna Banning, MD [facility] Radiology Electronically Signed   By: Roanna Banning M.D.   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 MELD 9 (01/27/2023). Child Pugh class B, 7 pts. FIPS prognostication favorable. EXAM: Procedures: 1. TRANSJUGULAR INTRAHEPATIC PORTOSYSTEMIC SHUNT (TIPS) 2. INTRACARDIAC ECHOCARDIOGRAPHY (ICE) 3.  GASTRO-ESOPHAGEAL VARICEAL EMBOLIZATION 4.  THERAPEUTIC RIGHT THORACENTESIS and PARACENTESIS MEDICATIONS: As antibiotic prophylaxis, Rocephin 2 gm IV was ordered pre-procedure and administered intravenously within  one hour of incision. ANESTHESIA/SEDATION: Sedation by the Anesthesia Team was performed. Please see anesthesiology log for details. CONTRAST:  80 mL Omnipaque 300, intravenous FLUOROSCOPY TIME:  Fluoroscopic dose; 320 mGy COMPLICATIONS: None immediate. PROCEDURE: Informed written consent was obtained from the patient and/or patient's representative after a thorough discussion of the procedural risks, benefits and alternatives. All questions were addressed. Maximal Sterile Barrier Technique was utilized including caps, mask, sterile gowns, sterile gloves, sterile drape, hand hygiene and skin antiseptic. A timeout was performed prior to the initiation of the procedure. RIGHT THORACENTESIS Initial ultrasound scanning demonstrates a large amount of pleural effusion within the RIGHT chest. Under direct ultrasound guidance, a 6 Fr pigtail catheter was introduced. An ultrasound image was saved for documentation purposes. Therapeutic RIGHT thoracentesis was performed. PARACENTESIS Additional ultrasound scanning of the abdomen demonstrated a small volume of ascites. Under direct ultrasound guidance, a 7 Fr pigtail catheter was introduced. An ultrasound image was saved for documentation purposes. Therapeutic paracentesis was performed. INTRACARDIAC ECHOCARDIOGRAPHY (ICE) A preliminary ultrasound of the RIGHT groin was performed and demonstrates a patent greater saphenous vein. A permanent ultrasound image was recorded. Using a combination of fluoroscopy and ultrasound, an access site was determined. A small dermatotomy was made at the planned puncture site. Using ultrasound guidance, access into the RIGHT greater saphenous vein was obtained with visualization of needle entry into the vessel using a standard micropuncture technique. A wire was advanced into the IVC insert all fascial dilation performed. An 8 Fr, 10 cm vascular sheath was placed into the external iliac vein. Through this access site, an 8 Fr AcuNav ICE  catheter was advanced with ease under fluoroscopic guidance to the level of the intrahepatic inferior vena cava. TRANSJUGULAR INTRAHEPATIC PORTOSYSTEMIC SHUNT (TIPS) A preliminary  ultrasound of the RIGHT neck was performed and demonstrates a patent internal jugular vein. A permanent ultrasound image was recorded. Using a combination of fluoroscopy and ultrasound, an access site was determined. A small dermatotomy was made at the planned puncture site. Using ultrasound guidance, access into the RIGHT internal jugular vein was obtained with visualization of needle entry into the vessel using a standard micropuncture technique. A wire was advanced into the IVC and serial fascial dilation performed. A 10 Fr sheath was placed into the internal jugular vein and advanced to the IVC. The jugular sheath was retracted into the RIGHT atrium and manometry was performed. A 5 Fr angled tip catheter was then directed into the RIGHT hepatic vein. Hepatic venogram was performed. These images demonstrated patent hepatic vein with no stenosis. The catheter was advanced to a wedge portion of the a patent vein over which the sheath was advanced into the distal RIGHT hepatic vein. Using ICE ultrasound visualization the catheter as RIGHT hepatic vein as well as the portal anatomy was defined. A planned exit site from the hepatic vein and puncture site from the portal vein was placed into a single sonographic plane. Under direct ultrasound visualization, the Argon Scorpion X needle was advanced into the central RIGHT portal vein. Hand injection of contrast confirmed position within the portal system. A Glidewire was then advanced into the superior mesenteric vein. The tract was pre dilated with an 8 mm balloon. A 5 Fr marking pigtail catheter was then advanced over the wire into the main portal vein and wire removed. Portal venogram was performed which demonstrated a patent portal vein. Portosystemic collaterals including gastroesophageal  varices were seen arising from the main portal vein. Portal manometry was then performed. The tract was then dilated to 8 mm with an 8 mm x 8 cm Athletis balloon. A 8-10 mm by 7 + 2 cm of GORE Viatorr TIPS stent was placed. Interval portal venogram with residual filling of the gastroesophageal varices. GASTRO-ESOPHAGEAL VARICEAL EMBOLIZATION Access was obtained into the gastroesophageal varix with an angled catheter, initial embolization attempt with a 8 mm AVP 4 Amplatzer plug was complicated by incomplete deployment then the plug partially embolized to the TIPS stent in an attempt at retraction. The plug was snared and removed. Access was obtained into the varix, then using a new 7 mm AVP 4 Amplatzer plug the varix was successfully embolized. Completion portal venogram demonstrates a patent TIPS endograft without significant residual filling of the varices. RIGHT atrial and portal pressures were obtained after shunt placement. The RIGHT thoracentesis and paracentesis drains, catheters and sheath were removed and manual compression was applied to the RIGHT internal jugular and greater saphenous venous access sites until hemostasis was achieved. The patient was transferred to the PACU in stable condition. Pre-TIPS Mean Pressures (mmHg): Right atrium: 8 Portal vein: 27 Portosystemic gradient: 19 Post-TIPS Mean Pressures (mmHg): Right atrium:14 Portal vein: 21 Portosystemic gradient: 7 IMPRESSION: 1. Access via the RIGHT jugular and greater saphenous veins. 2. Successful ICE-guided TIPS, via the RIGHT hepatic vein to the RIGHT portal vein, with placement of a 7+2 cm GORE Viatorr stent. 3. Reduction of portosystemic gradient from 19 to 7 mmHg 4. Successful gastroesophageal varix embolization with a single 7 mm AVP-4 Amplatzer plug. 5. Therapeutic RIGHT thoracentesis and paracentesis, with 1.5 L of serous pleural fluid and 0.6 L of ascites removed respectively. PLAN: The patient is enrolled in the [facility] portal  hypertension follow-up clinic, and will be closely followed per the protocol. Cletis Athens  Mugweru, MD [facility] Radiology Electronically Signed   By: Roanna Banning M.D.   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 MELD 9 (01/27/2023). Child Pugh class B, 7 pts. FIPS prognostication favorable. EXAM: Procedures: 1. TRANSJUGULAR INTRAHEPATIC PORTOSYSTEMIC SHUNT (TIPS) 2. INTRACARDIAC ECHOCARDIOGRAPHY (ICE) 3.  GASTRO-ESOPHAGEAL VARICEAL EMBOLIZATION 4.  THERAPEUTIC RIGHT THORACENTESIS and PARACENTESIS MEDICATIONS: As antibiotic prophylaxis, Rocephin 2 gm IV was ordered pre-procedure and administered intravenously within one hour of incision. ANESTHESIA/SEDATION: Sedation by the Anesthesia Team was performed. Please see anesthesiology log for details. CONTRAST:  80 mL Omnipaque 300, intravenous FLUOROSCOPY TIME:  Fluoroscopic dose; 320 mGy COMPLICATIONS: None immediate. PROCEDURE: Informed written consent was obtained from the patient and/or patient's representative after a thorough discussion of the procedural risks, benefits and alternatives. All questions were addressed. Maximal Sterile Barrier Technique was utilized including caps, mask, sterile gowns, sterile gloves, sterile drape, hand hygiene and skin antiseptic. A timeout was performed prior to the initiation of the procedure. RIGHT THORACENTESIS Initial ultrasound scanning demonstrates a large amount of pleural effusion within the RIGHT chest. Under direct ultrasound guidance, a 6 Fr pigtail catheter was introduced. An ultrasound image was saved for documentation purposes. Therapeutic RIGHT thoracentesis was performed. PARACENTESIS Additional ultrasound scanning of the abdomen demonstrated a small volume of ascites.  Under direct ultrasound guidance, a 7 Fr pigtail catheter was introduced. An ultrasound image was saved for documentation purposes. Therapeutic paracentesis was performed. INTRACARDIAC ECHOCARDIOGRAPHY (ICE) A preliminary ultrasound of the RIGHT groin was performed and demonstrates a patent greater saphenous vein. A permanent ultrasound image was recorded. Using a combination of fluoroscopy and ultrasound, an access site was determined. A small dermatotomy was made at the planned puncture site. Using ultrasound guidance, access into the RIGHT greater saphenous vein was obtained with visualization of needle entry into the vessel using a standard micropuncture technique. A wire was advanced into the IVC insert all fascial dilation performed. An 8 Fr, 10 cm vascular sheath was placed into the external iliac vein. Through this access site, an 8 Fr AcuNav ICE catheter was advanced with ease under fluoroscopic guidance to the level of the intrahepatic inferior vena cava. TRANSJUGULAR INTRAHEPATIC PORTOSYSTEMIC SHUNT (TIPS) A preliminary ultrasound of the RIGHT neck was performed and demonstrates a patent internal jugular vein. A permanent ultrasound image was recorded. Using a combination of fluoroscopy and ultrasound, an access site was determined. A small dermatotomy was made at the planned puncture site. Using ultrasound guidance, access into the RIGHT internal jugular vein was obtained with visualization of needle entry into the vessel using a standard micropuncture technique. A wire was advanced into the IVC and serial fascial dilation performed. A 10 Fr sheath was placed into the internal jugular vein and advanced to the IVC. The jugular sheath was retracted into the RIGHT atrium and manometry was performed. A 5 Fr angled tip catheter was then directed into the RIGHT hepatic vein. Hepatic venogram was performed. These images demonstrated patent hepatic vein with no stenosis. The catheter was advanced to a wedge  portion of the a patent vein over which the sheath was advanced into the distal RIGHT hepatic vein. Using ICE ultrasound visualization the catheter as RIGHT hepatic vein as well as the portal anatomy was defined. A planned exit site from the hepatic vein and puncture site  from the portal vein was placed into a single sonographic plane. Under direct ultrasound visualization, the Argon Scorpion X needle was advanced into the central RIGHT portal vein. Hand injection of contrast confirmed position within the portal system. A Glidewire was then advanced into the superior mesenteric vein. The tract was pre dilated with an 8 mm balloon. A 5 Fr marking pigtail catheter was then advanced over the wire into the main portal vein and wire removed. Portal venogram was performed which demonstrated a patent portal vein. 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 Portal manometry was then performed. The tract was then dilated to 8 mm with an 8 mm x 8 cm Athletis balloon. A 8-10 mm by 7 + 2 cm of GORE Viatorr TIPS stent was placed. Interval portal venogram with residual filling of the gastroesophageal varices. GASTRO-ESOPHAGEAL VARICEAL EMBOLIZATION Access was obtained into the gastroesophageal varix with an angled catheter, initial embolization attempt with a 8 mm AVP 4 Amplatzer plug was complicated by incomplete deployment then the plug partially embolized to the TIPS stent in an attempt at retraction. The plug was snared and removed. Access was obtained into the varix, then using a new 7 mm AVP 4 Amplatzer plug the varix was successfully embolized. Completion portal venogram demonstrates a patent TIPS endograft without significant residual filling of the varices. RIGHT atrial and portal pressures were obtained after shunt placement. The RIGHT thoracentesis and paracentesis drains, catheters and sheath were removed and manual compression was applied to the RIGHT  internal jugular and greater saphenous venous access sites until hemostasis was achieved. The patient was transferred to the PACU in stable condition. Pre-TIPS Mean Pressures (mmHg): Right atrium: 8 Portal vein: 27 Portosystemic gradient: 19 Post-TIPS Mean Pressures (mmHg): Right atrium:14 Portal vein: 21 Portosystemic gradient: 7 IMPRESSION: 1. Access via the RIGHT jugular and greater saphenous veins. 2. Successful ICE-guided TIPS, via the RIGHT hepatic vein to the RIGHT portal vein, with placement of a 7+2 cm GORE Viatorr stent. 3. Reduction of portosystemic gradient from 19 to 7 mmHg 4. Successful gastroesophageal varix embolization with a single 7 mm AVP-4 Amplatzer plug. 5. Therapeutic RIGHT thoracentesis and paracentesis, with 1.5 L of serous pleural fluid and 0.6 L of ascites removed respectively. PLAN: The patient is enrolled in the [facility] portal hypertension follow-up clinic, and will be closely followed per the protocol. Roanna Banning, MD [facility] Radiology Electronically Signed   By: Roanna Banning M.D.   On: 04/17/2023 17:23   DG Chest Port 1 View  Result Date: 04/17/2023 CLINICAL DATA:  62 year old male status post tips EXAM: PORTABLE CHEST 1 VIEW COMPARISON:  04/13/2023 FINDINGS: Cardiomediastinal silhouette unchanged in size and contour. Left chest wall pacing device/AICD unchanged. Veiled opacity of the right lung, with improved aeration compared to the prior plain film. No meniscus. No pneumothorax.  No left-sided pleural effusion. Partial visualization of transjugular intrahepatic portosystemic shunt in the upper abdomen, as well as embolization of varices in the left upper abdomen. IMPRESSION: No complicating features status post tips Filled opacity of the right hemithorax compatible with residual right pleural fluid though improved in aeration compared to the prior. Electronically Signed   By: Gilmer Mor D.O.   On: 04/17/2023 16:14     Scheduled Meds:  busPIRone  5 mg Oral BID   carvedilol  18.75 mg Oral BID   DULoxetine  90 mg Oral Daily   [START ON 04/19/2023] furosemide  20 mg Oral  Daily   insulin aspart  0-15 Units Subcutaneous TID WC   insulin detemir  18 Units Subcutaneous QHS   lactulose  10 g Oral TID   pantoprazole  40 mg Oral Daily   pregabalin  150 mg Oral BID   rifaximin  550 mg Oral BID   sodium chloride flush  3 mL Intravenous Q12H   [START ON 04/19/2023] spironolactone  100 mg Oral Daily   Continuous Infusions:   LOS: 5 days   Time spent: 45 minutes  Carollee Herter, DO  [facility]  04/18/2023, 11:39 AM

## 2023-04-18 NOTE — Inpatient Diabetes Management (Signed)
Inpatient Diabetes Program Recommendations  AACE/ADA: New Consensus Statement on Inpatient Glycemic Control (2015)  Target Ranges:  Prepandial:   less than 140 mg/dL      Peak postprandial:   less than 180 mg/dL (1-2 hours)      Critically ill patients:  140 - 180 mg/dL   Lab Results  Component Value Date   GLUCAP 308 (H) 04/18/2023   HGBA1C 10.0 (H) 01/25/2023    Review of Glycemic Control  Diabetes history: type 2 Outpatient Diabetes medications: Farxiga 10 mg daily, Januvia 50 mg daily Current orders for Inpatient glycemic control: Levemir 18 units at HS, Novolog 0-9 units correction scale TID, Novolog 0-5 units HS scale, Novolog 2 units TID with meals  Inpatient Diabetes Program Recommendations:   Spoke with patient at he bedside. Discussed possibly going home on insulin. He was agreeable to this if that is what he needs.  Demonstrated the insulin pen for patient. Reviewed the insulin pen administration with complete feedback on steps to take. Demonstrated without issues, except for not having his glasses with him. Staff RN was able to let patient give his own insulin injection into abdomen and he did very well.   Not sure what his insurance [employer] will cover for the insulin, so he is checking with them this afternoon. He spoke with the social worker about possibly having to take insulin. Recommend not starting him on Levemir insulin since it will not be made anymore by the company. He will need a different brand of insulin pen that will be covered by his insurance.  Discussed patient's HgbA1C of 1.% in September, 2024. States that he probably needs a new one checked. Will order his a booklet Living Well with Diabetes for home use and will order an insulin pen starter kit to take home with needles and directions for using the insulin pen. Discussed hypoglycemia and how to treat. Will need prescription for home blood glucose meter, strips, and lancets (order #[id number]) at  discharge.  Will follow up tomorrow to see what insulin his insurance will cover.   Smith Mince RN BSN CDE Diabetes Coordinator Pager: [phone]  8am-5pm

## 2023-04-18 NOTE — Assessment & Plan Note (Addendum)
04-18-2023 BP running borderline low. Now that he has TIPS and has reduced portal pressure, may need to back off his diuretics. 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 Change his lasix and aldactone to once a day.  Will ask cards to review if he needs formal consult.

## 2023-04-18 NOTE — Assessment & Plan Note (Signed)
04-17-2023 had TIPS today 04-18-2023 minor elevation in LFTs. IR is not concerned about this.

## 2023-04-18 NOTE — Plan of Care (Signed)
  Problem: Health Behavior/Discharge Planning: Goal: Ability to manage health-related needs will improve Outcome: Progressing   Problem: Clinical Measurements: Goal: Ability to maintain clinical measurements within normal limits will improve Outcome: Progressing Goal: Will remain free from infection Outcome: Progressing   Problem: Coping: Goal: Level of anxiety will decrease Outcome: Progressing   Problem: Elimination: Goal: Will not experience complications related to bowel motility Outcome: Progressing Goal: Will not experience complications related to urinary retention Outcome: Progressing   Problem: Pain Management: Goal: General experience of comfort will improve Outcome: Progressing   Problem: Safety: Goal: Ability to remain free from injury will improve Outcome: Progressing   Problem: Safety: Goal: Ability to remain free from injury will improve Outcome: Progressing   Problem: Skin Integrity: Goal: Risk for impaired skin integrity will decrease Outcome: Progressing   Problem: Education: Goal: Ability to describe self-care measures that may prevent or decrease complications (Diabetes Survival Skills Education) will improve Outcome: Progressing Goal: Individualized Educational Video(s) Outcome: Progressing   Problem: Coping: Goal: Ability to adjust to condition or change in health will improve Outcome: Progressing   Problem: Fluid Volume: Goal: Ability to maintain a balanced intake and output will improve Outcome: Progressing   Problem: Health Behavior/Discharge Planning: Goal: Ability to identify and utilize available resources and services will improve Outcome: Progressing Goal: Ability to manage health-related needs will improve Outcome: Progressing   Problem: Metabolic: Goal: Ability to maintain appropriate glucose levels will improve Outcome: Progressing   Problem: Nutritional: Goal: Maintenance of adequate nutrition will improve Outcome:  Progressing Goal: Progress toward achieving an optimal weight will improve Outcome: Progressing   Problem: Skin Integrity: Goal: Risk for impaired skin integrity will decrease Outcome: Progressing   Problem: Tissue Perfusion: Goal: Adequacy of tissue perfusion will improve Outcome: Progressing   Problem: Education: Goal: Understanding of CV disease, CV risk reduction, and recovery process will improve Outcome: Progressing Goal: Individualized Educational Video(s) Outcome: Progressing   Problem: Activity: Goal: Ability to return to baseline activity level will improve Outcome: Progressing   Problem: Cardiovascular: Goal: Ability to achieve and maintain adequate cardiovascular perfusion will improve Outcome: Progressing Goal: Vascular access site(s) Level 0-1 will be maintained Outcome: Progressing   Problem: Health Behavior/Discharge Planning: Goal: Ability to safely manage health-related needs after discharge will improve Outcome: Progressing

## 2023-04-18 NOTE — Progress Notes (Signed)
Referring Physician(s): Dr Theodis Sato  Supervising Physician: Marliss Coots  Patient Status:  [facility] - In-pt  Chief Complaint:  Hydrothorax  Subjective:  Diagnosis: cirrhosis with pHTN and hepatic hydrothorax    Procedure(s):  TRANSJUGULAR INTRAHEPATIC PORTOSYSTEMIC SHUNT (TIPS)  INTRACARDIAC ECHOCARDIOGRAPHY (ICE) THERAPEUTIC PARACENTESIS and RIGHT THORACENTESIS GASTRO-ESOPHAGEAL VARICEAL EMBOLIZATION  Tolerated well Pt is up in bed today Room air Minimal soreness at Rt neck Minimal cough Great spirits   Allergies: Latex  Medications: Prior to Admission medications   Medication Sig Start Date End Date Taking? Authorizing Provider  busPIRone (BUSPAR) 5 MG tablet Take 5 mg by mouth 2 (two) times daily.   Yes [provider]  carboxymethylcellulose (REFRESH PLUS) 0.5 % SOLN Place 1 drop into both eyes 2 (two) times daily as needed (dry eyes).   Yes [provider]  carvedilol (COREG) 12.5 MG tablet Take 1.5 tablets (18.75 mg total) by mouth 2 (two) times daily. 12/29/20  Yes Graciella Freer, PA-C  dapagliflozin propanediol (FARXIGA) 10 MG TABS tablet Take 10 mg by mouth daily.   Yes [provider]  DULoxetine (CYMBALTA) 30 MG capsule Take 30 mg by mouth daily.   Yes [provider]  DULoxetine (CYMBALTA) 60 MG capsule Take 60 mg by mouth daily.   Yes [provider]  ferrous sulfate 325 (65 FE) MG EC tablet Take 325 mg by mouth daily with breakfast.   Yes [provider]  fexofenadine (ALLEGRA) 60 MG tablet Take 60 mg by mouth daily as needed for allergies.   Yes [provider]  fluticasone (FLONASE) 50 MCG/ACT nasal spray Place 1 spray into both nostrils daily as needed for allergies or rhinitis.   Yes [provider]  furosemide (LASIX) 40 MG tablet Take 1 tablet (40 mg total) by mouth daily. Two tabs twice daily Patient taking differently: Take 40 mg by mouth 2 (two) times daily. 01/29/23  Yes  Lanae Boast, MD  hydrOXYzine (VISTARIL) 25 MG capsule Take 25 mg by mouth 3 (three) times daily as needed for anxiety.   Yes [provider]  lidocaine 4 % Place 1 patch onto the skin 2 (two) times daily as needed (pain).   Yes [provider]  montelukast (SINGULAIR) 10 MG tablet Take 10 mg by mouth every evening.   Yes [provider]  pantoprazole (PROTONIX) 40 MG tablet Take 40 mg by mouth daily.   Yes [provider]  pregabalin (LYRICA) 150 MG capsule Take 150 mg by mouth 2 (two) times daily.   Yes [provider]  sitaGLIPtin (JANUVIA) 50 MG tablet Take 50 mg by mouth daily.   Yes [provider]  spironolactone (ALDACTONE) 100 MG tablet Take 200 mg by mouth 2 (two) times daily.   Yes [provider]  albuterol (VENTOLIN HFA) 108 (90 Base) MCG/ACT inhaler Inhale into the lungs every 6 (six) hours as needed for wheezing or shortness of breath. Patient not taking: Reported on 04/12/2023    [provider]     Vital Signs: BP 104/76 (BP Location: Right Arm)   Pulse 92   Temp 98.1 F (36.7 C) (Oral)   Resp 18   Ht 5\' 6"  (1.676 m)   Wt 155 lb 13.8 oz (70.7 kg)   SpO2 99%   BMI 25.16 kg/m   Physical Exam Vitals reviewed.  Constitutional:      General: He is not in acute distress.    Comments: A/O Pleasant    Neck:  Comments: Rt neck site is clean and dry NT no bleeding No hematoma Pulmonary:     Effort: Pulmonary effort is normal.     Breath sounds: Normal breath sounds. No wheezing or rhonchi.  Abdominal:     Palpations: Abdomen is soft.  Skin:    General: Skin is warm and dry.     Comments: Sites for Rt thora and TIPS are both clean and dry NT no bleeding No sign of infection  Neurological:     Mental Status: He is alert and oriented to person, place, and time.  Psychiatric:        Behavior: Behavior normal.     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 MELD 9 (01/27/2023). Child Pugh class B, 7 pts. FIPS prognostication favorable. EXAM: Procedures: 1. TRANSJUGULAR INTRAHEPATIC PORTOSYSTEMIC SHUNT (TIPS) 2. INTRACARDIAC ECHOCARDIOGRAPHY (ICE) 3.  GASTRO-ESOPHAGEAL VARICEAL EMBOLIZATION 4.  THERAPEUTIC RIGHT THORACENTESIS and PARACENTESIS MEDICATIONS: As antibiotic prophylaxis, Rocephin 2 gm IV was ordered pre-procedure and administered intravenously within one hour of incision. ANESTHESIA/SEDATION: Sedation by the Anesthesia Team was performed. Please see anesthesiology log for details. CONTRAST:  80 mL Omnipaque 300, intravenous FLUOROSCOPY TIME:  Fluoroscopic dose; 320 mGy COMPLICATIONS: None immediate. PROCEDURE: Informed written consent was obtained from the patient and/or patient's representative after a thorough discussion of the procedural risks, benefits and alternatives. All questions were addressed. Maximal Sterile Barrier Technique was utilized including caps, mask, sterile gowns, sterile gloves, sterile drape, hand hygiene and skin antiseptic. A timeout was performed prior to the initiation of the procedure. RIGHT THORACENTESIS Initial ultrasound scanning demonstrates a large amount of pleural effusion within the RIGHT chest. Under direct ultrasound guidance, a 6 Fr pigtail catheter was introduced. An ultrasound image was saved for documentation purposes. Therapeutic RIGHT thoracentesis was performed. PARACENTESIS Additional ultrasound scanning of the abdomen demonstrated a small volume of ascites. Under direct ultrasound guidance, a 7 Fr pigtail catheter was introduced. An ultrasound image was saved for documentation purposes. Therapeutic paracentesis was performed. INTRACARDIAC ECHOCARDIOGRAPHY (ICE) A preliminary ultrasound of the RIGHT groin was performed and demonstrates a patent greater saphenous vein. A permanent ultrasound image was  recorded. Using a combination of fluoroscopy and ultrasound, an access site was determined. A small dermatotomy was made at the planned puncture site. Using ultrasound guidance, access into the RIGHT greater saphenous vein was obtained with visualization of needle entry into the vessel using a standard micropuncture technique. A wire was advanced into the IVC insert all fascial dilation performed. An 8 Fr, 10 cm vascular sheath was placed into the external iliac vein. Through this access site, an 8 Fr AcuNav ICE catheter was advanced with ease under fluoroscopic guidance to the level of the intrahepatic inferior vena cava. TRANSJUGULAR INTRAHEPATIC PORTOSYSTEMIC SHUNT (TIPS) A preliminary ultrasound of the RIGHT neck was performed and demonstrates a patent internal jugular vein. A permanent ultrasound image was recorded. Using a combination of fluoroscopy and ultrasound, an access site was determined. A small dermatotomy was made at the planned puncture site. Using ultrasound guidance, access into the RIGHT internal jugular vein was obtained with visualization of needle entry into the vessel using a standard micropuncture technique. A wire was advanced into the IVC and serial fascial dilation performed. A 10 Fr sheath was placed into the internal jugular vein and advanced to the IVC. The jugular sheath was retracted into the RIGHT atrium and manometry was  performed. A 5 Fr angled tip catheter was then directed into the RIGHT hepatic vein. Hepatic venogram was performed. These images demonstrated patent hepatic vein with no stenosis. The catheter was advanced to a wedge portion of the a patent vein over which the sheath was advanced into the distal RIGHT hepatic vein. Using ICE ultrasound visualization the catheter as RIGHT hepatic vein as well as the portal anatomy was defined. A planned exit site from the hepatic vein and puncture site from the portal vein was placed into a single sonographic plane. Under direct  ultrasound visualization, the Argon Scorpion X needle was advanced into the central RIGHT portal vein. Hand injection of contrast confirmed position within the portal system. A Glidewire was then advanced into the superior mesenteric vein. The tract was pre dilated with an 8 mm balloon. A 5 Fr marking pigtail catheter was then advanced over the wire into the main portal vein and wire removed. Portal venogram was performed which demonstrated a patent portal vein. 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 Portal manometry was then performed. The tract was then dilated to 8 mm with an 8 mm x 8 cm Athletis balloon. A 8-10 mm by 7 + 2 cm of GORE Viatorr TIPS stent was placed. Interval portal venogram with residual filling of the gastroesophageal varices. GASTRO-ESOPHAGEAL VARICEAL EMBOLIZATION Access was obtained into the gastroesophageal varix with an angled catheter, initial embolization attempt with a 8 mm AVP 4 Amplatzer plug was complicated by incomplete deployment then the plug partially embolized to the TIPS stent in an attempt at retraction. The plug was snared and removed. Access was obtained into the varix, then using a new 7 mm AVP 4 Amplatzer plug the varix was successfully embolized. Completion portal venogram demonstrates a patent TIPS endograft without significant residual filling of the varices. RIGHT atrial and portal pressures were obtained after shunt placement. The RIGHT thoracentesis and paracentesis drains, catheters and sheath were removed and manual compression was applied to the RIGHT internal jugular and greater saphenous venous access sites until hemostasis was achieved. The patient was transferred to the PACU in stable condition. Pre-TIPS Mean Pressures (mmHg): Right atrium: 8 Portal vein: 27 Portosystemic gradient: 19 Post-TIPS Mean Pressures (mmHg): Right atrium:14 Portal vein: 21 Portosystemic gradient: 7 IMPRESSION: 1. Access  via the RIGHT jugular and greater saphenous veins. 2. Successful ICE-guided TIPS, via the RIGHT hepatic vein to the RIGHT portal vein, with placement of a 7+2 cm GORE Viatorr stent. 3. Reduction of portosystemic gradient from 19 to 7 mmHg 4. Successful gastroesophageal varix embolization with a single 7 mm AVP-4 Amplatzer plug. 5. Therapeutic RIGHT thoracentesis and paracentesis, with 1.5 L of serous pleural fluid and 0.6 L of ascites removed respectively. PLAN: The patient is enrolled in the [facility] portal hypertension follow-up clinic, and will be closely followed per the protocol. Roanna Banning, MD [facility] Radiology Electronically Signed   By: Roanna Banning M.D.   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 MELD 9 (01/27/2023). Child Pugh class B, 7 pts. FIPS prognostication favorable. EXAM: Procedures: 1. TRANSJUGULAR INTRAHEPATIC PORTOSYSTEMIC SHUNT (TIPS) 2. INTRACARDIAC ECHOCARDIOGRAPHY (ICE) 3.  GASTRO-ESOPHAGEAL VARICEAL EMBOLIZATION 4.  THERAPEUTIC RIGHT THORACENTESIS and PARACENTESIS MEDICATIONS: As antibiotic prophylaxis, Rocephin 2 gm IV was ordered pre-procedure and administered intravenously within one hour of incision. ANESTHESIA/SEDATION: Sedation  by the Anesthesia Team was performed. Please see anesthesiology log for details. CONTRAST:  80 mL Omnipaque 300, intravenous FLUOROSCOPY TIME:  Fluoroscopic dose; 320 mGy COMPLICATIONS: None immediate. PROCEDURE: Informed written consent was obtained from the patient and/or patient's representative after a thorough discussion of the procedural risks, benefits and alternatives. All questions were addressed. Maximal Sterile Barrier Technique was utilized including caps, mask, sterile gowns, sterile gloves, sterile drape, hand hygiene and skin  antiseptic. A timeout was performed prior to the initiation of the procedure. RIGHT THORACENTESIS Initial ultrasound scanning demonstrates a large amount of pleural effusion within the RIGHT chest. Under direct ultrasound guidance, a 6 Fr pigtail catheter was introduced. An ultrasound image was saved for documentation purposes. Therapeutic RIGHT thoracentesis was performed. PARACENTESIS Additional ultrasound scanning of the abdomen demonstrated a small volume of ascites. Under direct ultrasound guidance, a 7 Fr pigtail catheter was introduced. An ultrasound image was saved for documentation purposes. Therapeutic paracentesis was performed. INTRACARDIAC ECHOCARDIOGRAPHY (ICE) A preliminary ultrasound of the RIGHT groin was performed and demonstrates a patent greater saphenous vein. A permanent ultrasound image was recorded. Using a combination of fluoroscopy and ultrasound, an access site was determined. A small dermatotomy was made at the planned puncture site. Using ultrasound guidance, access into the RIGHT greater saphenous vein was obtained with visualization of needle entry into the vessel using a standard micropuncture technique. A wire was advanced into the IVC insert all fascial dilation performed. An 8 Fr, 10 cm vascular sheath was placed into the external iliac vein. Through this access site, an 8 Fr AcuNav ICE catheter was advanced with ease under fluoroscopic guidance to the level of the intrahepatic inferior vena cava. TRANSJUGULAR INTRAHEPATIC PORTOSYSTEMIC SHUNT (TIPS) A preliminary ultrasound of the RIGHT neck was performed and demonstrates a patent internal jugular vein. A permanent ultrasound image was recorded. Using a combination of fluoroscopy and ultrasound, an access site was determined. A small dermatotomy was made at the planned puncture site. Using ultrasound guidance, access into the RIGHT internal jugular vein was obtained with visualization of needle entry into the vessel using a  standard micropuncture technique. A wire was advanced into the IVC and serial fascial dilation performed. A 10 Fr sheath was placed into the internal jugular vein and advanced to the IVC. The jugular sheath was retracted into the RIGHT atrium and manometry was performed. A 5 Fr angled tip catheter was then directed into the RIGHT hepatic vein. Hepatic venogram was performed. These images demonstrated patent hepatic vein with no stenosis. The catheter was advanced to a wedge portion of the a patent vein over which the sheath was advanced into the distal RIGHT hepatic vein. Using ICE ultrasound visualization the catheter as RIGHT hepatic vein as well as the portal anatomy was defined. A planned exit site from the hepatic vein and puncture site from the portal vein was placed into a single sonographic plane. Under direct ultrasound visualization, the Argon Scorpion X needle was advanced into the central RIGHT portal vein. Hand injection of contrast confirmed position within the portal system. A Glidewire was then advanced into the superior mesenteric vein. The tract was pre dilated with an 8 mm balloon. A 5 Fr marking pigtail catheter was then advanced over the wire into the main portal vein and wire removed. Portal venogram was performed which demonstrated a patent portal vein. 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 Portal manometry was then performed. The tract was then dilated to 8 mm with  an 8 mm x 8 cm Athletis balloon. A 8-10 mm by 7 + 2 cm of GORE Viatorr TIPS stent was placed. Interval portal venogram with residual filling of the gastroesophageal varices. GASTRO-ESOPHAGEAL VARICEAL EMBOLIZATION Access was obtained into the gastroesophageal varix with an angled catheter, initial embolization attempt with a 8 mm AVP 4 Amplatzer plug was complicated by incomplete deployment then the plug partially embolized to the TIPS stent in an attempt at  retraction. The plug was snared and removed. Access was obtained into the varix, then using a new 7 mm AVP 4 Amplatzer plug the varix was successfully embolized. Completion portal venogram demonstrates a patent TIPS endograft without significant residual filling of the varices. RIGHT atrial and portal pressures were obtained after shunt placement. The RIGHT thoracentesis and paracentesis drains, catheters and sheath were removed and manual compression was applied to the RIGHT internal jugular and greater saphenous venous access sites until hemostasis was achieved. The patient was transferred to the PACU in stable condition. Pre-TIPS Mean Pressures (mmHg): Right atrium: 8 Portal vein: 27 Portosystemic gradient: 19 Post-TIPS Mean Pressures (mmHg): Right atrium:14 Portal vein: 21 Portosystemic gradient: 7 IMPRESSION: 1. Access via the RIGHT jugular and greater saphenous veins. 2. Successful ICE-guided TIPS, via the RIGHT hepatic vein to the RIGHT portal vein, with placement of a 7+2 cm GORE Viatorr stent. 3. Reduction of portosystemic gradient from 19 to 7 mmHg 4. Successful gastroesophageal varix embolization with a single 7 mm AVP-4 Amplatzer plug. 5. Therapeutic RIGHT thoracentesis and paracentesis, with 1.5 L of serous pleural fluid and 0.6 L of ascites removed respectively. PLAN: The patient is enrolled in the [facility] portal hypertension follow-up clinic, and will be closely followed per the protocol. Roanna Banning, MD [facility] Inter[facility] Radiology Specialists [facility] Radiology Electronically Signed   By: Roanna Banning M.D.   On: 04/17/2023 17:23   IR THORACENTESIS ASP PLEURAL SPACE W/IMG GUIDE  Result Date: 04/17/2023 CLINICAL DATA:  Cirrhosis Briefly, 62 year old male with a history of decompensated EtOH cirrhosis and refractory RIGHT hepatic hydrothorax. MELD 9 (01/27/2023). Child Pugh class B, 7 pts. FIPS prognostication favorable. EXAM: Procedures: 1. TRANSJUGULAR INTRAHEPATIC PORTOSYSTEMIC SHUNT  (TIPS) 2. INTRACARDIAC ECHOCARDIOGRAPHY (ICE) 3.  GASTRO-ESOPHAGEAL VARICEAL EMBOLIZATION 4.  THERAPEUTIC RIGHT THORACENTESIS and PARACENTESIS MEDICATIONS: As antibiotic prophylaxis, Rocephin 2 gm IV was ordered pre-procedure and administered intravenously within one hour of incision. ANESTHESIA/SEDATION: Sedation by the Anesthesia Team was performed. Please see anesthesiology log for details. CONTRAST:  80 mL Omnipaque 300, intravenous FLUOROSCOPY TIME:  Fluoroscopic dose; 320 mGy COMPLICATIONS: None immediate. PROCEDURE: Informed written consent was obtained from the patient and/or patient's representative after a thorough discussion of the procedural risks, benefits and alternatives. All questions were addressed. Maximal Sterile Barrier Technique was utilized including caps, mask, sterile gowns, sterile gloves, sterile drape, hand hygiene and skin antiseptic. A timeout was performed prior to the initiation of the procedure. RIGHT THORACENTESIS Initial ultrasound scanning demonstrates a large amount of pleural effusion within the RIGHT chest. Under direct ultrasound guidance, a 6 Fr pigtail catheter was introduced. An ultrasound image was saved for documentation purposes. Therapeutic RIGHT thoracentesis was performed. PARACENTESIS Additional ultrasound scanning of the abdomen demonstrated a small volume of ascites. Under direct ultrasound guidance, a 7 Fr pigtail catheter was introduced. An ultrasound image was saved for documentation purposes. Therapeutic paracentesis was performed. INTRACARDIAC ECHOCARDIOGRAPHY (ICE) A preliminary ultrasound of the RIGHT groin was performed and demonstrates a patent greater saphenous vein. A permanent ultrasound image was recorded. Using a combination of  fluoroscopy and ultrasound, an access site was determined. A small dermatotomy was made at the planned puncture site. Using ultrasound guidance, access into the RIGHT greater saphenous vein was obtained with visualization of  needle entry into the vessel using a standard micropuncture technique. A wire was advanced into the IVC insert all fascial dilation performed. An 8 Fr, 10 cm vascular sheath was placed into the external iliac vein. Through this access site, an 8 Fr AcuNav ICE catheter was advanced with ease under fluoroscopic guidance to the level of the intrahepatic inferior vena cava. TRANSJUGULAR INTRAHEPATIC PORTOSYSTEMIC SHUNT (TIPS) A preliminary ultrasound of the RIGHT neck was performed and demonstrates a patent internal jugular vein. A permanent ultrasound image was recorded. Using a combination of fluoroscopy and ultrasound, an access site was determined. A small dermatotomy was made at the planned puncture site. Using ultrasound guidance, access into the RIGHT internal jugular vein was obtained with visualization of needle entry into the vessel using a standard micropuncture technique. A wire was advanced into the IVC and serial fascial dilation performed. A 10 Fr sheath was placed into the internal jugular vein and advanced to the IVC. The jugular sheath was retracted into the RIGHT atrium and manometry was performed. A 5 Fr angled tip catheter was then directed into the RIGHT hepatic vein. Hepatic venogram was performed. These images demonstrated patent hepatic vein with no stenosis. The catheter was advanced to a wedge portion of the a patent vein over which the sheath was advanced into the distal RIGHT hepatic vein. Using ICE ultrasound visualization the catheter as RIGHT hepatic vein as well as the portal anatomy was defined. A planned exit site from the hepatic vein and puncture site from the portal vein was placed into a single sonographic plane. Under direct ultrasound visualization, the Argon Scorpion X needle was advanced into the central RIGHT portal vein. Hand injection of contrast confirmed position within the portal system. A Glidewire was then advanced into the superior mesenteric vein. The tract was pre  dilated with an 8 mm balloon. A 5 Fr marking pigtail catheter was then advanced over the wire into the main portal vein and wire removed. Portal venogram was performed which demonstrated a patent portal vein. 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 Portal manometry was then performed. The tract was then dilated to 8 mm with an 8 mm x 8 cm Athletis balloon. A 8-10 mm by 7 + 2 cm of GORE Viatorr TIPS stent was placed. Interval portal venogram with residual filling of the gastroesophageal varices. GASTRO-ESOPHAGEAL VARICEAL EMBOLIZATION Access was obtained into the gastroesophageal varix with an angled catheter, initial embolization attempt with a 8 mm AVP 4 Amplatzer plug was complicated by incomplete deployment then the plug partially embolized to the TIPS stent in an attempt at retraction. The plug was snared and removed. Access was obtained into the varix, then using a new 7 mm AVP 4 Amplatzer plug the varix was successfully embolized. Completion portal venogram demonstrates a patent TIPS endograft without significant residual filling of the varices. RIGHT atrial and portal pressures were obtained after shunt placement. The RIGHT thoracentesis and paracentesis drains, catheters and sheath were removed and manual compression was applied to the RIGHT internal jugular and greater saphenous venous access sites until hemostasis was achieved. The patient was transferred to the PACU in stable condition. Pre-TIPS Mean Pressures (mmHg): Right atrium: 8 Portal vein: 27 Portosystemic gradient: 19 Post-TIPS Mean Pressures (mmHg): Right atrium:14 Portal vein: 21  Portosystemic gradient: 7 IMPRESSION: 1. Access via the RIGHT jugular and greater saphenous veins. 2. Successful ICE-guided TIPS, via the RIGHT hepatic vein to the RIGHT portal vein, with placement of a 7+2 cm GORE Viatorr stent. 3. Reduction of portosystemic gradient from 19 to 7 mmHg 4. Successful  gastroesophageal varix embolization with a single 7 mm AVP-4 Amplatzer plug. 5. Therapeutic RIGHT thoracentesis and paracentesis, with 1.5 L of serous pleural fluid and 0.6 L of ascites removed respectively. PLAN: The patient is enrolled in the [facility] portal hypertension follow-up clinic, and will be closely followed per the protocol. Roanna Banning, MD [facility] Inter[facility]iology Special[facility] Radiology Electronically Signed   By: Roanna Banning M.D.   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 MELD 9 (01/27/2023). Child Pugh class B, 7 pts. FIPS prognostication favorable. EXAM: Procedures: 1. TRANSJUGULAR INTRAHEPATIC PORTOSYSTEMIC SHUNT (TIPS) 2. INTRACARDIAC ECHOCARDIOGRAPHY (ICE) 3.  GASTRO-ESOPHAGEAL VARICEAL EMBOLIZATION 4.  THERAPEUTIC RIGHT THORACENTESIS and PARACENTESIS MEDICATIONS: As antibiotic prophylaxis, Rocephin 2 gm IV was ordered pre-procedure and administered intravenously within one hour of incision. ANESTHESIA/SEDATION: Sedation by the Anesthesia Team was performed. Please see anesthesiology log for details. CONTRAST:  80 mL Omnipaque 300, intravenous FLUOROSCOPY TIME:  Fluoroscopic dose; 320 mGy COMPLICATIONS: None immediate. PROCEDURE: Informed written consent was obtained from the patient and/or patient's representative after a thorough discussion of the procedural risks, benefits and alternatives. All questions were addressed. Maximal Sterile Barrier Technique was utilized including caps, mask, sterile gowns, sterile gloves, sterile drape, hand hygiene and skin antiseptic. A timeout was performed prior to the initiation of the procedure. RIGHT THORACENTESIS Initial ultrasound scanning demonstrates a large amount of pleural effusion within the RIGHT chest. Under direct ultrasound guidance, a 6 Fr  pigtail catheter was introduced. An ultrasound image was saved for documentation purposes. Therapeutic RIGHT thoracentesis was performed. PARACENTESIS Additional ultrasound scanning of the abdomen demonstrated a small volume of ascites. Under direct ultrasound guidance, a 7 Fr pigtail catheter was introduced. An ultrasound image was saved for documentation purposes. Therapeutic paracentesis was performed. INTRACARDIAC ECHOCARDIOGRAPHY (ICE) A preliminary ultrasound of the RIGHT groin was performed and demonstrates a patent greater saphenous vein. A permanent ultrasound image was recorded. Using a combination of fluoroscopy and ultrasound, an access site was determined. A small dermatotomy was made at the planned puncture site. Using ultrasound guidance, access into the RIGHT greater saphenous vein was obtained with visualization of needle entry into the vessel using a standard micropuncture technique. A wire was advanced into the IVC insert all fascial dilation performed. An 8 Fr, 10 cm vascular sheath was placed into the external iliac vein. Through this access site, an 8 Fr AcuNav ICE catheter was advanced with ease under fluoroscopic guidance to the level of the intrahepatic inferior vena cava. TRANSJUGULAR INTRAHEPATIC PORTOSYSTEMIC SHUNT (TIPS) A preliminary ultrasound of the RIGHT neck was performed and demonstrates a patent internal jugular vein. A permanent ultrasound image was recorded. Using a combination of fluoroscopy and ultrasound, an access site was determined. A small dermatotomy was made at the planned puncture site. Using ultrasound guidance, access into the RIGHT internal jugular vein was obtained with visualization of needle entry into the vessel using a standard micropuncture technique. A wire was advanced into the IVC and serial fascial dilation performed. A 10 Fr sheath was placed into the internal jugular vein and  advanced to the IVC. The jugular sheath was retracted into the RIGHT atrium and  manometry was performed. A 5 Fr angled tip catheter was then directed into the RIGHT hepatic vein. Hepatic venogram was performed. These images demonstrated patent hepatic vein with no stenosis. The catheter was advanced to a wedge portion of the a patent vein over which the sheath was advanced into the distal RIGHT hepatic vein. Using ICE ultrasound visualization the catheter as RIGHT hepatic vein as well as the portal anatomy was defined. A planned exit site from the hepatic vein and puncture site from the portal vein was placed into a single sonographic plane. Under direct ultrasound visualization, the Argon Scorpion X needle was advanced into the central RIGHT portal vein. Hand injection of contrast confirmed position within the portal system. A Glidewire was then advanced into the superior mesenteric vein. The tract was pre dilated with an 8 mm balloon. A 5 Fr marking pigtail catheter was then advanced over the wire into the main portal vein and wire removed. Portal venogram was performed which demonstrated a patent portal vein. 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 Portal manometry was then performed. The tract was then dilated to 8 mm with an 8 mm x 8 cm Athletis balloon. A 8-10 mm by 7 + 2 cm of GORE Viatorr TIPS stent was placed. Interval portal venogram with residual filling of the gastroesophageal varices. GASTRO-ESOPHAGEAL VARICEAL EMBOLIZATION Access was obtained into the gastroesophageal varix with an angled catheter, initial embolization attempt with a 8 mm AVP 4 Amplatzer plug was complicated by incomplete deployment then the plug partially embolized to the TIPS stent in an attempt at retraction. The plug was snared and removed. Access was obtained into the varix, then using a new 7 mm AVP 4 Amplatzer plug the varix was successfully embolized. Completion portal venogram demonstrates a patent TIPS endograft without significant  residual filling of the varices. RIGHT atrial and portal pressures were obtained after shunt placement. The RIGHT thoracentesis and paracentesis drains, catheters and sheath were removed and manual compression was applied to the RIGHT internal jugular and greater saphenous venous access sites until hemostasis was achieved. The patient was transferred to the PACU in stable condition. Pre-TIPS Mean Pressures (mmHg): Right atrium: 8 Portal vein: 27 Portosystemic gradient: 19 Post-TIPS Mean Pressures (mmHg): Right atrium:14 Portal vein: 21 Portosystemic gradient: 7 IMPRESSION: 1. Access via the RIGHT jugular and greater saphenous veins. 2. Successful ICE-guided TIPS, via the RIGHT hepatic vein to the RIGHT portal vein, with placement of a 7+2 cm GORE Viatorr stent. 3. Reduction of portosystemic gradient from 19 to 7 mmHg 4. Successful gastroesophageal varix embolization with a single 7 mm AVP-4 Amplatzer plug. 5. Therapeutic RIGHT thoracentesis and paracentesis, with 1.5 L of serous pleural fluid and 0.6 L of ascites removed respectively. PLAN: The patient is enrolled in the [facility] portal hypertension follow-up clinic, and will be closely followed per the protocol. Roanna Banning, MD [facility] Radiology Electronically Signed   By: Roanna Banning M.D.   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 MELD 9 (01/27/2023). Child Pugh class B, 7 pts. FIPS prognostication favorable. EXAM: Procedures: 1. TRANSJUGULAR INTRAHEPATIC PORTOSYSTEMIC SHUNT (TIPS) 2. INTRACARDIAC ECHOCARDIOGRAPHY (ICE) 3.  GASTRO-ESOPHAGEAL VARICEAL EMBOLIZATION 4.  THERAPEUTIC RIGHT THORACENTESIS and PARACENTESIS MEDICATIONS:  As antibiotic prophylaxis, Rocephin 2 gm IV was ordered pre-procedure and administered intravenously within  one hour of incision. ANESTHESIA/SEDATION: Sedation by the Anesthesia Team was performed. Please see anesthesiology log for details. CONTRAST:  80 mL Omnipaque 300, intravenous FLUOROSCOPY TIME:  Fluoroscopic dose; 320 mGy COMPLICATIONS: None immediate. PROCEDURE: Informed written consent was obtained from the patient and/or patient's representative after a thorough discussion of the procedural risks, benefits and alternatives. All questions were addressed. Maximal Sterile Barrier Technique was utilized including caps, mask, sterile gowns, sterile gloves, sterile drape, hand hygiene and skin antiseptic. A timeout was performed prior to the initiation of the procedure. RIGHT THORACENTESIS Initial ultrasound scanning demonstrates a large amount of pleural effusion within the RIGHT chest. Under direct ultrasound guidance, a 6 Fr pigtail catheter was introduced. An ultrasound image was saved for documentation purposes. Therapeutic RIGHT thoracentesis was performed. PARACENTESIS Additional ultrasound scanning of the abdomen demonstrated a small volume of ascites. Under direct ultrasound guidance, a 7 Fr pigtail catheter was introduced. An ultrasound image was saved for documentation purposes. Therapeutic paracentesis was performed. INTRACARDIAC ECHOCARDIOGRAPHY (ICE) A preliminary ultrasound of the RIGHT groin was performed and demonstrates a patent greater saphenous vein. A permanent ultrasound image was recorded. Using a combination of fluoroscopy and ultrasound, an access site was determined. A small dermatotomy was made at the planned puncture site. Using ultrasound guidance, access into the RIGHT greater saphenous vein was obtained with visualization of needle entry into the vessel using a standard micropuncture technique. A wire was advanced into the IVC insert all fascial dilation performed. An 8 Fr, 10 cm vascular sheath was placed into the external iliac vein. Through this access site, an 8 Fr AcuNav ICE  catheter was advanced with ease under fluoroscopic guidance to the level of the intrahepatic inferior vena cava. TRANSJUGULAR INTRAHEPATIC PORTOSYSTEMIC SHUNT (TIPS) A preliminary ultrasound of the RIGHT neck was performed and demonstrates a patent internal jugular vein. A permanent ultrasound image was recorded. Using a combination of fluoroscopy and ultrasound, an access site was determined. A small dermatotomy was made at the planned puncture site. Using ultrasound guidance, access into the RIGHT internal jugular vein was obtained with visualization of needle entry into the vessel using a standard micropuncture technique. A wire was advanced into the IVC and serial fascial dilation performed. A 10 Fr sheath was placed into the internal jugular vein and advanced to the IVC. The jugular sheath was retracted into the RIGHT atrium and manometry was performed. A 5 Fr angled tip catheter was then directed into the RIGHT hepatic vein. Hepatic venogram was performed. These images demonstrated patent hepatic vein with no stenosis. The catheter was advanced to a wedge portion of the a patent vein over which the sheath was advanced into the distal RIGHT hepatic vein. Using ICE ultrasound visualization the catheter as RIGHT hepatic vein as well as the portal anatomy was defined. A planned exit site from the hepatic vein and puncture site from the portal vein was placed into a single sonographic plane. Under direct ultrasound visualization, the Argon Scorpion X needle was advanced into the central RIGHT portal vein. Hand injection of contrast confirmed position within the portal system. A Glidewire was then advanced into the superior mesenteric vein. The tract was pre dilated with an 8 mm balloon. A 5 Fr marking pigtail catheter was then advanced over the wire into the main portal vein and wire removed. Portal venogram was performed which demonstrated a patent portal vein. Portosystemic collaterals including gastroesophageal  varices were  seen arising from the main portal vein. Portal manometry was then performed. The tract was then dilated to 8 mm with an 8 mm x 8 cm Athletis balloon. A 8-10 mm by 7 + 2 cm of GORE Viatorr TIPS stent was placed. Interval portal venogram with residual filling of the gastroesophageal varices. GASTRO-ESOPHAGEAL VARICEAL EMBOLIZATION Access was obtained into the gastroesophageal varix with an angled catheter, initial embolization attempt with a 8 mm AVP 4 Amplatzer plug was complicated by incomplete deployment then the plug partially embolized to the TIPS stent in an attempt at retraction. The plug was snared and removed. Access was obtained into the varix, then using a new 7 mm AVP 4 Amplatzer plug the varix was successfully embolized. Completion portal venogram demonstrates a patent TIPS endograft without significant residual filling of the varices. RIGHT atrial and portal pressures were obtained after shunt placement. The RIGHT thoracentesis and paracentesis drains, catheters and sheath were removed and manual compression was applied to the RIGHT internal jugular and greater saphenous venous access sites until hemostasis was achieved. The patient was transferred to the PACU in stable condition. Pre-TIPS Mean Pressures (mmHg): Right atrium: 8 Portal vein: 27 Portosystemic gradient: 19 Post-TIPS Mean Pressures (mmHg): Right atrium:14 Portal vein: 21 Portosystemic gradient: 7 IMPRESSION: 1. Access via the RIGHT jugular and greater saphenous veins. 2. Successful ICE-guided TIPS, via the RIGHT hepatic vein to the RIGHT portal vein, with placement of a 7+2 cm GORE Viatorr stent. 3. Reduction of portosystemic gradient from 19 to 7 mmHg 4. Successful gastroesophageal varix embolization with a single 7 mm AVP-4 Amplatzer plug. 5. Therapeutic RIGHT thoracentesis and paracentesis, with 1.5 L of serous pleural fluid and 0.6 L of ascites removed respectively. PLAN: The patient is enrolled in the [facility] portal  hypertension follow-up clinic, and will be closely followed per the protocol. Roanna Banning, MD [facility] Inter[facility] Radiology Specialists [facility] Radiology Electronically Signed   By: Roanna Banning M.D.   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 MELD 9 (01/27/2023). Child Pugh class B, 7 pts. FIPS prognostication favorable. EXAM: Procedures: 1. TRANSJUGULAR INTRAHEPATIC PORTOSYSTEMIC SHUNT (TIPS) 2. INTRACARDIAC ECHOCARDIOGRAPHY (ICE) 3.  GASTRO-ESOPHAGEAL VARICEAL EMBOLIZATION 4.  THERAPEUTIC RIGHT THORACENTESIS and PARACENTESIS MEDICATIONS: As antibiotic prophylaxis, Rocephin 2 gm IV was ordered pre-procedure and administered intravenously within one hour of incision. ANESTHESIA/SEDATION: Sedation by the Anesthesia Team was performed. Please see anesthesiology log for details. CONTRAST:  80 mL Omnipaque 300, intravenous FLUOROSCOPY TIME:  Fluoroscopic dose; 320 mGy COMPLICATIONS: None immediate. PROCEDURE: Informed written consent was obtained from the patient and/or patient's representative after a thorough discussion of the procedural risks, benefits and alternatives. All questions were addressed. Maximal Sterile Barrier Technique was utilized including caps, mask, sterile gowns, sterile gloves, sterile drape, hand hygiene and skin antiseptic. A timeout was performed prior to the initiation of the procedure. RIGHT THORACENTESIS Initial ultrasound scanning demonstrates a large amount of pleural effusion within the RIGHT chest. Under direct ultrasound guidance, a 6 Fr pigtail catheter was introduced. An ultrasound image was saved for documentation purposes. Therapeutic RIGHT thoracentesis was performed. PARACENTESIS Additional ultrasound scanning of the abdomen demonstrated a small volume of ascites.  Under direct ultrasound guidance, a 7 Fr pigtail catheter was introduced. An ultrasound image was saved for documentation purposes. Therapeutic paracentesis was performed. INTRACARDIAC ECHOCARDIOGRAPHY (ICE) A preliminary  ultrasound of the RIGHT groin was performed and demonstrates a patent greater saphenous vein. A permanent ultrasound image was recorded. Using a combination of fluoroscopy and ultrasound, an access site was determined. A small dermatotomy was made at the planned puncture site. Using ultrasound guidance, access into the RIGHT greater saphenous vein was obtained with visualization of needle entry into the vessel using a standard micropuncture technique. A wire was advanced into the IVC insert all fascial dilation performed. An 8 Fr, 10 cm vascular sheath was placed into the external iliac vein. Through this access site, an 8 Fr AcuNav ICE catheter was advanced with ease under fluoroscopic guidance to the level of the intrahepatic inferior vena cava. TRANSJUGULAR INTRAHEPATIC PORTOSYSTEMIC SHUNT (TIPS) A preliminary ultrasound of the RIGHT neck was performed and demonstrates a patent internal jugular vein. A permanent ultrasound image was recorded. Using a combination of fluoroscopy and ultrasound, an access site was determined. A small dermatotomy was made at the planned puncture site. Using ultrasound guidance, access into the RIGHT internal jugular vein was obtained with visualization of needle entry into the vessel using a standard micropuncture technique. A wire was advanced into the IVC and serial fascial dilation performed. A 10 Fr sheath was placed into the internal jugular vein and advanced to the IVC. The jugular sheath was retracted into the RIGHT atrium and manometry was performed. A 5 Fr angled tip catheter was then directed into the RIGHT hepatic vein. Hepatic venogram was performed. These images demonstrated patent hepatic vein with no stenosis. The catheter was advanced to a wedge  portion of the a patent vein over which the sheath was advanced into the distal RIGHT hepatic vein. Using ICE ultrasound visualization the catheter as RIGHT hepatic vein as well as the portal anatomy was defined. A planned exit site from the hepatic vein and puncture site from the portal vein was placed into a single sonographic plane. Under direct ultrasound visualization, the Argon Scorpion X needle was advanced into the central RIGHT portal vein. Hand injection of contrast confirmed position within the portal system. A Glidewire was then advanced into the superior mesenteric vein. The tract was pre dilated with an 8 mm balloon. A 5 Fr marking pigtail catheter was then advanced over the wire into the main portal vein and wire removed. Portal venogram was performed which demonstrated a patent portal vein. 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 Portal manometry was then performed. The tract was then dilated to 8 mm with an 8 mm x 8 cm Athletis balloon. A 8-10 mm by 7 + 2 cm of GORE Viatorr TIPS stent was placed. Interval portal venogram with residual filling of the gastroesophageal varices. GASTRO-ESOPHAGEAL VARICEAL EMBOLIZATION Access was obtained into the gastroesophageal varix with an angled catheter, initial embolization attempt with a 8 mm AVP 4 Amplatzer plug was complicated by incomplete deployment then the plug partially embolized to the TIPS stent in an attempt at retraction. The plug was snared and removed. Access was obtained into the varix, then using a new 7 mm AVP 4 Amplatzer plug the varix was successfully embolized. Completion portal venogram demonstrates a patent TIPS endograft without significant residual filling of the varices. RIGHT atrial and portal pressures were obtained after shunt placement. The RIGHT thoracentesis and paracentesis drains, catheters and sheath were removed and manual compression was applied to the RIGHT  internal jugular and greater saphenous venous access sites until hemostasis was achieved. The patient was transferred to the PACU in  stable condition. Pre-TIPS Mean Pressures (mmHg): Right atrium: 8 Portal vein: 27 Portosystemic gradient: 19 Post-TIPS Mean Pressures (mmHg): Right atrium:14 Portal vein: 21 Portosystemic gradient: 7 IMPRESSION: 1. Access via the RIGHT jugular and greater saphenous veins. 2. Successful ICE-guided TIPS, via the RIGHT hepatic vein to the RIGHT portal vein, with placement of a 7+2 cm GORE Viatorr stent. 3. Reduction of portosystemic gradient from 19 to 7 mmHg 4. Successful gastroesophageal varix embolization with a single 7 mm AVP-4 Amplatzer plug. 5. Therapeutic RIGHT thoracentesis and paracentesis, with 1.5 L of serous pleural fluid and 0.6 L of ascites removed respectively. PLAN: The patient is enrolled in the [facility] portal hypertension follow-up clinic, and will be closely followed per the protocol. Roanna Banning, MD Vascular and Interventional Radiology Specialists [facility] Radiology Electronically Signed   By: Roanna Banning M.D.   On: 04/17/2023 17:23   DG Chest Port 1 View  Result Date: 04/17/2023 CLINICAL DATA:  62 year old male status post tips EXAM: PORTABLE CHEST 1 VIEW COMPARISON:  04/13/2023 FINDINGS: Cardiomediastinal silhouette unchanged in size and contour. Left chest wall pacing device/AICD unchanged. Veiled opacity of the right lung, with improved aeration compared to the prior plain film. No meniscus. No pneumothorax.  No left-sided pleural effusion. Partial visualization of transjugular intrahepatic portosystemic shunt in the upper abdomen, as well as embolization of varices in the left upper abdomen. IMPRESSION: No complicating features status post tips Filled opacity of the right hemithorax compatible with residual right pleural fluid though improved in aeration compared to the prior. Electronically Signed   By: Gilmer Mor D.O.   On: 04/17/2023 16:14   IR  Paracentesis  Result Date: 04/14/2023 INDICATION: Patient with history of cirrhosis with recurrent pleural effusion. Found to have ascites. Request is for therapeutic and diagnostic paracentesis EXAM: ULTRASOUND GUIDED THERAPEUTIC AND DIAGNOSTIC PARACENTESIS MEDICATIONS: Lidocaine 1% 10 mL COMPLICATIONS: None immediate. PROCEDURE: Informed written consent was obtained from the patient after a discussion of the risks, benefits and alternatives to treatment. A timeout was performed prior to the initiation of the procedure. Initial ultrasound scanning demonstrates a small amount of ascites within the left lower abdominal quadrant. The lung lower abdomen was prepped and draped in the usual sterile fashion. 1% lidocaine was used for local anesthesia. Following this, a 19 gauge, 7-cm, Yueh catheter was introduced. An ultrasound image was saved for documentation purposes. The paracentesis was performed. The catheter was removed and a dressing was applied. The patient tolerated the procedure well without immediate post procedural complication. FINDINGS: A total of approximately 1.6 L of pale yellow fluid was removed. Samples were sent to the laboratory as requested by the clinical team. IMPRESSION: Successful ultrasound-guided therapeutic and diagnostic left side paracentesis yielding 1.2 liters of peritoneal fluid. Performed by Anders Grant NP PLAN: If the patient eventually requires >/=2 paracenteses in a 30 day period, candidacy for formal evaluation by the [facility] Interventional Radiology Portal Hypertension Clinic will be assessed. Electronically Signed   By: Malachy Moan M.D.   On: 04/14/2023 13:26    Labs:  CBC: Recent Labs    04/14/23 0547 04/15/23 0858 04/17/23 1007 04/17/23 1213 04/17/23 1636 04/18/23 0419  WBC 2.1* 2.8*  --   --  3.5* 5.2  HGB 7.6* 8.7* 10.5* 9.9* 8.8* 8.2*  HCT 25.8* 29.8* 31.0* 29.0* 30.8* 28.3*  PLT 76* 99*  --   --  111* 106*    COAGS: Recent Labs     01/26/23 1001 04/12/23 1220 04/13/23 0419 04/17/23 0356  INR  1.1 1.0 1.1 1.1    BMP: Recent Labs    04/14/23 0547 04/15/23 0858 04/17/23 1007 04/17/23 1213 04/17/23 1636 04/18/23 0419  NA 132* 132* 139 140 136 137  K 3.5 4.1 3.5 3.8 4.0 4.3  CL 102 99  --   --  103 104  CO2 25 27  --   --  24 23  GLUCOSE 221* 236*  --   --  204* 278*  BUN 11 14  --   --  11 12  CALCIUM 8.3* 8.8*  --   --  8.1* 8.5*  CREATININE 0.72 0.84  --   --  0.87 1.09  GFRNONAA >60 >60  --   --  >60 >60    LIVER FUNCTION TESTS: Recent Labs    04/14/23 0547 04/15/23 0858 04/17/23 1636 04/18/23 0419  BILITOT 1.0 0.5 0.8 0.8  AST 23 26 38 216*  ALT 18 17 22  119*  ALKPHOS 98 102 90 101  PROT 5.5* 6.2* 6.2* 5.4*  ALBUMIN 2.9* 3.0* 3.5 3.0*    Assessment and Plan:  Doing well post TIPS/Thoracentesis and variceal embolization in IR 11/25 Labs with elevation in AST/ALT- not unexpected VSS Will follow   Electronically Signed: Robet Leu, PA-C 04/18/2023, 10:25 AM   I spent a total of 15 Minutes at the the patient's bedside AND on the patient's hospital floor or unit, greater than 50% of which was counseling/coordinating care for TIPs

## 2023-04-19 ENCOUNTER — Other Ambulatory Visit (HOSPITAL_COMMUNITY): Payer: Self-pay

## 2023-04-19 DIAGNOSIS — J948 Other specified pleural conditions: Secondary | ICD-10-CM | POA: Diagnosis not present

## 2023-04-19 LAB — GLUCOSE, CAPILLARY: Glucose-Capillary: 223 mg/dL — ABNORMAL HIGH (ref 70–99)

## 2023-04-19 LAB — COMPREHENSIVE METABOLIC PANEL
ALT: 331 U/L — ABNORMAL HIGH (ref 0–44)
AST: 614 U/L — ABNORMAL HIGH (ref 15–41)
Albumin: 2.9 g/dL — ABNORMAL LOW (ref 3.5–5.0)
Alkaline Phosphatase: 117 U/L (ref 38–126)
Anion gap: 6 (ref 5–15)
BUN: 9 mg/dL (ref 6–20)
CO2: 27 mmol/L (ref 22–32)
Calcium: 8.1 mg/dL — ABNORMAL LOW (ref 8.9–10.3)
Chloride: 102 mmol/L (ref 98–111)
Creatinine, Ser: 0.83 mg/dL (ref 0.61–1.24)
GFR, Estimated: >60 mL/min (ref >60)
Glucose, Bld: 293 mg/dL — ABNORMAL HIGH (ref 70–99)
Potassium: 3.7 mmol/L (ref 3.5–5.1)
Sodium: 135 mmol/L (ref 135–145)
Total Bilirubin: 0.6 mg/dL (ref <1.2)
Total Protein: 5.3 g/dL — ABNORMAL LOW (ref 6.5–8.1)

## 2023-04-19 LAB — CULTURE, BODY FLUID W GRAM STAIN -BOTTLE: Culture: NO GROWTH

## 2023-04-19 LAB — AMMONIA: Ammonia: 33 umol/L (ref 9–35)

## 2023-04-19 MED ORDER — LACTULOSE 10 GM/15ML PO SOLN: 20.0000 g | Freq: Two times a day (BID) | ORAL | 0 refills | Status: AC

## 2023-04-19 MED ORDER — SPIRONOLACTONE 50 MG PO TABS
50.0000 mg | ORAL_TABLET | Freq: Every day | ORAL | 0 refills | Status: DC
  Filled 2023-04-19: qty 30, 30d supply, fill #0

## 2023-04-19 MED ORDER — FUROSEMIDE 20 MG PO TABS: 20.0000 mg | ORAL_TABLET | Freq: Every day | ORAL | 0 refills | Status: DC

## 2023-04-19 MED ORDER — SPIRONOLACTONE 50 MG PO TABS: 50.0000 mg | ORAL_TABLET | Freq: Every day | ORAL | 0 refills | Status: DC

## 2023-04-19 MED ORDER — LACTULOSE 10 GM/15ML PO SOLN
20.0000 g | Freq: Two times a day (BID) | ORAL | 0 refills | Status: DC
  Filled 2023-04-19: qty 946, 16d supply, fill #0

## 2023-04-19 MED ORDER — FUROSEMIDE 20 MG PO TABS
20.0000 mg | ORAL_TABLET | Freq: Every day | ORAL | 0 refills | Status: DC
  Filled 2023-04-19: qty 30, 30d supply, fill #0

## 2023-04-19 NOTE — Inpatient Diabetes Management (Signed)
Inpatient Diabetes Program Recommendations  AACE/ADA: New Consensus Statement on Inpatient Glycemic Control (2015)  Target Ranges:  Prepandial:   less than 140 mg/dL      Peak postprandial:   less than 180 mg/dL (1-2 hours)      Critically ill patients:  140 - 180 mg/dL   Lab Results  Component Value Date   GLUCAP 223 (H) 04/19/2023   HGBA1C 10.0 (H) 01/25/2023    Latest Reference Range & Units 04/18/23 11:29 04/18/23 16:36 04/18/23 16:56 04/18/23 20:48 04/19/23 07:36  Glucose-Capillary 70 - 99 mg/dL 161 (H) 096 (H) 045 (H) 242 (H) 223 (H)  (H): Data is abnormally high Review of Glycemic Control  Diabetes history: type 2 Outpatient Diabetes medications: Farxiga 10 mg daily, Januvia 50 mg daily Current orders for Inpatient glycemic control: Levemir 18 units at HS, Novolog 0-15 units correction scale TID, Novolog 0-5 units HS scale  Inpatient Diabetes Program Recommendations:   Noted that blood sugars have been greater than 180 mg/dl.  Recommend increasing Levemir to 20 units at HS, continue Novolog 0-15 units correction scale TID, HS scale, may consider adding Novolog 3 units TID with meals if patient eats at least 50% of meal.  Smith Mince RN BSN CDE Diabetes Coordinator Pager: [phone]  8am-5pm

## 2023-04-19 NOTE — Progress Notes (Signed)
DISCHARGE NOTE HOME RANGER [facility] to be discharged Home per MD order. Discussed prescriptions and follow up appointments with the patient. Prescriptions given to patient; medication list explained in detail. Patient verbalized understanding.  Skin clean, dry and intact without evidence of skin break down, no evidence of skin tears noted. IV catheter discontinued intact. Site without signs and symptoms of complications. Dressing and pressure applied. Pt denies pain at the site currently. No complaints noted.  Patient free of lines, drains, and wounds.   An After Visit Summary (AVS) was printed and given to the patient. Patient escorted via wheelchair, and discharged home via private auto.  Margarita Grizzle, RN

## 2023-04-19 NOTE — Plan of Care (Signed)
  Problem: Health Behavior/Discharge Planning: Goal: Ability to manage health-related needs will improve Outcome: Completed/Met   Problem: Clinical Measurements: Goal: Ability to maintain clinical measurements within normal limits will improve Outcome: Completed/Met Goal: Will remain free from infection Outcome: Completed/Met   Problem: Coping: Goal: Level of anxiety will decrease Outcome: Completed/Met   Problem: Elimination: Goal: Will not experience complications related to bowel motility Outcome: Completed/Met Goal: Will not experience complications related to urinary retention Outcome: Completed/Met   Problem: Pain Management: Goal: General experience of comfort will improve Outcome: Completed/Met   Problem: Safety: Goal: Ability to remain free from injury will improve Outcome: Completed/Met   Problem: Skin Integrity: Goal: Risk for impaired skin integrity will decrease Outcome: Completed/Met   Problem: Education: Goal: Ability to describe self-care measures that may prevent or decrease complications (Diabetes Survival Skills Education) will improve Outcome: Completed/Met Goal: Individualized Educational Video(s) Outcome: Completed/Met   Problem: Coping: Goal: Ability to adjust to condition or change in health will improve Outcome: Completed/Met   Problem: Fluid Volume: Goal: Ability to maintain a balanced intake and output will improve Outcome: Completed/Met   Problem: Health Behavior/Discharge Planning: Goal: Ability to identify and utilize available resources and services will improve Outcome: Completed/Met Goal: Ability to manage health-related needs will improve Outcome: Completed/Met   Problem: Metabolic: Goal: Ability to maintain appropriate glucose levels will improve Outcome: Completed/Met   Problem: Nutritional: Goal: Maintenance of adequate nutrition will improve Outcome: Completed/Met Goal: Progress toward achieving an optimal weight will  improve Outcome: Completed/Met   Problem: Skin Integrity: Goal: Risk for impaired skin integrity will decrease Outcome: Completed/Met   Problem: Tissue Perfusion: Goal: Adequacy of tissue perfusion will improve Outcome: Completed/Met   Problem: Education: Goal: Understanding of CV disease, CV risk reduction, and recovery process will improve Outcome: Completed/Met Goal: Individualized Educational Video(s) Outcome: Completed/Met   Problem: Activity: Goal: Ability to return to baseline activity level will improve Outcome: Completed/Met   Problem: Cardiovascular: Goal: Ability to achieve and maintain adequate cardiovascular perfusion will improve Outcome: Completed/Met Goal: Vascular access site(s) Level 0-1 will be maintained Outcome: Completed/Met   Problem: Health Behavior/Discharge Planning: Goal: Ability to safely manage health-related needs after discharge will improve Outcome: Completed/Met

## 2023-04-19 NOTE — Plan of Care (Signed)
Problem: Health Behavior/Discharge Planning: Goal: Ability to manage health-related needs will improve Outcome: Progressing   Problem: Clinical Measurements: Goal: Ability to maintain clinical measurements within normal limits will improve Outcome: Progressing Goal: Will remain free from infection Outcome: Progressing   Problem: Coping: Goal: Level of anxiety will decrease Outcome: Progressing

## 2023-04-19 NOTE — Discharge Summary (Signed)
Physician Discharge Summary  Connor Vaughn YQM:[id number] DOB: June 25, 1960 DOA: 04/12/2023  PCP: Ricky Stabs, NP-C  Admit date: 04/12/2023 Discharge date: 04/19/2023  Admitted From: home Disposition:  home  Recommendations for Outpatient Follow-up:  Follow up with PCP in 1-2 weeks Follow up with IR as scheduled Please obtain BMP/CBC in one week  Home Health: none Equipment/Devices: none  Discharge Condition: stable CODE STATUS: DNR Diet Orders (From admission, onward)     Start     Ordered   04/17/23 1616  Diet 2 gram sodium Room service appropriate? Yes; Fluid consistency: Thin  Diet effective now       Question Answer Comment  Room service appropriate? Yes   Fluid consistency: Thin      04/17/23 1615            HPI: Per admitting MD, Connor Vaughn is a 62 y.o. male with medical history significant of CKD 3A, diabetes, HCM status post ICD, NSVT, cirrhosis, hepatitis C, spinal stenosis with neurogenic, occasion presenting with shortness of breath and abdominal distention. Patient presenting with recurrent shortness of breath and new abdominal distention.  Patient has known history of cirrhosis with recurrent hydrothorax and has undergone around 14 or 15 thoracenteses in the past.  He states he has never had a paracentesis.  Chart shows some history of ascites but never severe.  Patient follows up with GI in [place].  Additionally follows with interventional radiology for his recurrent thoracenteses. Has TIPS procedure planned by interventional radiology per chart review. As above has had worsening shortness of breath and abdominal distention with shortness of breath being similar to previous and abdominal distention being new. Denies fevers, chills, chest pain, abdominal pain, constipation, diarrhea, nausea, vomiting.  Hospital Course / Discharge diagnoses: Principal Problem:   Hydrothorax Active Problems:   Moderate aortic valve stenosis - mean  gradient 23.6 mmHg, peak gradient 41.9 mmHg . 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   CKD stage 3a, GFR 45-59 ml/min (HCC)   DM (diabetes mellitus), type 2 with complications (HCC)   Hypertrophic cardiomyopathy (HCC)   ICD (implantable cardioverter-defibrillator) in place   DNR (do not resuscitate)/DNI(Do Not Intubate)  Principal problem Decompensated liver disease, recurrent hydrothorax -patient was admitted to the hospital with recurrent hydrothorax.  IR was consulted, he is status post TIPS placement on 11/25.  He recovered well postprocedure, remained stable, ambulating in the hallway on room air and alert and oriented x 4.  Discussed with IR, stable to go home and will follow-up as an outpatient.  Diuretic doses were decreased upon discharge.  He was advised for daily weights  Active problems Liver cirrhosis -he was started on lactulose following the procedure, remains alert and oriented x 4, no asterixis.  Will continue lactulose upon discharge.  Patient educated about need to have 2-3 bowel movements each day and that his lactulose dose could be increased if he becomes constipated Moderate AS -now backed off a little bit on his diuretics as above.  Dr. Imogene Burn discussed with cardiology, he will need outpatient follow-up DM2-poorly controlled, with hyperglycemia-resume home medications on discharge Hypertrophy cardiomyopathy-cardiology outpatient follow-up.  Has an ICD in place CKD 3A-renal function stable Anemia, thrombocytopenia-due to underlying liver disease Elevated LFTs-expected postprocedure.  Repeat as an outpatient in about a week Depression-continue home medications  Sepsis ruled  out   Discharge Instructions   Allergies as of 04/19/2023       Reactions   Latex Hives, Rash        Medication List     TAKE these medications    albuterol 108 (90 Base) MCG/ACT inhaler Commonly known as: VENTOLIN HFA Inhale into the lungs every 6 (six) hours as needed for wheezing or shortness of breath.   busPIRone 5 MG tablet Commonly known as: BUSPAR Take 5 mg by mouth 2 (two) times daily.   carboxymethylcellulose 0.5 % Soln Commonly known as: REFRESH PLUS Place 1 drop into both eyes 2 (two) times daily as needed (dry eyes).   carvedilol 12.5 MG tablet Commonly known as: COREG Take 1.5 tablets (18.75 mg total) by mouth 2 (two) times daily.   dapagliflozin propanediol 10 MG Tabs tablet Commonly known as: FARXIGA Take 10 mg by mouth daily.   DULoxetine 30 MG capsule Commonly known as: CYMBALTA Take 30 mg by mouth daily.   DULoxetine 60 MG capsule Commonly known as: CYMBALTA Take 60 mg by mouth daily.   ferrous sulfate 325 (65 FE) MG EC tablet Take 325 mg by mouth daily with breakfast.   fexofenadine 60 MG tablet Commonly known as: ALLEGRA Take 60 mg by mouth daily as needed for allergies.   fluticasone 50 MCG/ACT nasal spray Commonly known as: FLONASE Place 1 spray into both nostrils daily as needed for allergies or rhinitis.   furosemide 20 MG tablet Commonly known as: LASIX Take 1 tablet (20 mg total) by mouth daily. Start taking on: April 20, 2023 What changed:  medication strength how much to take additional instructions   hydrOXYzine 25 MG capsule Commonly known as: VISTARIL Take 25 mg by mouth 3 (three) times daily as needed for anxiety.   lactulose 10 GM/15ML solution Commonly known as: CHRONULAC Take 30 mLs (20 g total) by mouth 2 (two) times daily.   lidocaine 4 % Place 1 patch onto the skin 2 (two) times daily as needed (pain).   montelukast 10 MG tablet Commonly known as: SINGULAIR Take 10 mg by mouth every evening.    pantoprazole 40 MG tablet Commonly known as: PROTONIX Take 40 mg by mouth daily.   pregabalin 150 MG capsule Commonly known as: LYRICA Take 150 mg by mouth 2 (two) times daily.   sitaGLIPtin 50 MG tablet Commonly known as: JANUVIA Take 50 mg by mouth daily.   spironolactone 50 MG tablet Commonly known as: ALDACTONE Take 1 tablet (50 mg total) by mouth daily. Start taking on: April 20, 2023 What changed:  medication strength how much to take when to take this        Follow-up Information     Mugweru, Jon, MD Follow up in 1 week(s).   Specialties: Interventional Radiology, Diagnostic Radiology, Radiology Why: follow up with Dr Milford Cage in 1 month at outpt clinic--- pt will hear from scheduler for appt time and date.  Call [phone] if any questions.  Also 6 mo TIPs doppler--- again will hear from sheduler for appt time and date Contact information: [address] [place] [address] [phone]                Consultations: IR  Procedures/Studies:  IR Tips  Result Date: 04/17/2023 CLINICAL DATA:  Cirrhosis Briefly, 62 year old male with a history of decompensated EtOH cirrhosis and refractory RIGHT hepatic hydrothorax. MELD 9 (  01/27/2023). Child Pugh class B, 7 pts. FIPS prognostication favorable. EXAM: Procedures: 1. TRANSJUGULAR INTRAHEPATIC PORTOSYSTEMIC SHUNT (TIPS) 2. INTRACARDIAC ECHOCARDIOGRAPHY (ICE) 3.  GASTRO-ESOPHAGEAL VARICEAL EMBOLIZATION 4.  THERAPEUTIC RIGHT THORACENTESIS and PARACENTESIS MEDICATIONS: As antibiotic prophylaxis, Rocephin 2 gm IV was ordered pre-procedure and administered intravenously within one hour of incision. ANESTHESIA/SEDATION: Sedation by the Anesthesia Team was performed. Please see anesthesiology log for details. CONTRAST:  80 mL Omnipaque 300, intravenous FLUOROSCOPY TIME:  Fluoroscopic dose; 320 mGy COMPLICATIONS: None immediate. PROCEDURE: Informed written consent was obtained from the patient and/or  patient's representative after a thorough discussion of the procedural risks, benefits and alternatives. All questions were addressed. Maximal Sterile Barrier Technique was utilized including caps, mask, sterile gowns, sterile gloves, sterile drape, hand hygiene and skin antiseptic. A timeout was performed prior to the initiation of the procedure. RIGHT THORACENTESIS Initial ultrasound scanning demonstrates a large amount of pleural effusion within the RIGHT chest. Under direct ultrasound guidance, a 6 Fr pigtail catheter was introduced. An ultrasound image was saved for documentation purposes. Therapeutic RIGHT thoracentesis was performed. PARACENTESIS Additional ultrasound scanning of the abdomen demonstrated a small volume of ascites. Under direct ultrasound guidance, a 7 Fr pigtail catheter was introduced. An ultrasound image was saved for documentation purposes. Therapeutic paracentesis was performed. INTRACARDIAC ECHOCARDIOGRAPHY (ICE) A preliminary ultrasound of the RIGHT groin was performed and demonstrates a patent greater saphenous vein. A permanent ultrasound image was recorded. Using a combination of fluoroscopy and ultrasound, an access site was determined. A small dermatotomy was made at the planned puncture site. Using ultrasound guidance, access into the RIGHT greater saphenous vein was obtained with visualization of needle entry into the vessel using a standard micropuncture technique. A wire was advanced into the IVC insert all fascial dilation performed. An 8 Fr, 10 cm vascular sheath was placed into the external iliac vein. Through this access site, an 8 Fr AcuNav ICE catheter was advanced with ease under fluoroscopic guidance to the level of the intrahepatic inferior vena cava. TRANSJUGULAR INTRAHEPATIC PORTOSYSTEMIC SHUNT (TIPS) A preliminary ultrasound of the RIGHT neck was performed and demonstrates a patent internal jugular vein. A permanent ultrasound image was recorded. Using a combination  of fluoroscopy and ultrasound, an access site was determined. A small dermatotomy was made at the planned puncture site. Using ultrasound guidance, access into the RIGHT internal jugular vein was obtained with visualization of needle entry into the vessel using a standard micropuncture technique. A wire was advanced into the IVC and serial fascial dilation performed. A 10 Fr sheath was placed into the internal jugular vein and advanced to the IVC. The jugular sheath was retracted into the RIGHT atrium and manometry was performed. A 5 Fr angled tip catheter was then directed into the RIGHT hepatic vein. Hepatic venogram was performed. These images demonstrated patent hepatic vein with no stenosis. The catheter was advanced to a wedge portion of the a patent vein over which the sheath was advanced into the distal RIGHT hepatic vein. Using ICE ultrasound visualization the catheter as RIGHT hepatic vein as well as the portal anatomy was defined. A planned exit site from the hepatic vein and puncture site from the portal vein was placed into a single sonographic plane. Under direct ultrasound visualization, the Argon Scorpion X needle was advanced into the central RIGHT portal vein. Hand injection of contrast confirmed position within the portal system. A Glidewire was then advanced into the superior mesenteric vein. The tract was pre dilated with an 8 mm balloon. A 5  Fr marking pigtail catheter was then advanced over the wire into the main portal vein and wire removed. Portal venogram was performed which demonstrated a patent portal vein. 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 Portal manometry was then performed. The tract was then dilated to 8 mm with an 8 mm x 8 cm Athletis balloon. A 8-10 mm by 7 + 2 cm of GORE Viatorr TIPS stent was placed. Interval portal venogram with residual filling of the gastroesophageal varices. GASTRO-ESOPHAGEAL VARICEAL  EMBOLIZATION Access was obtained into the gastroesophageal varix with an angled catheter, initial embolization attempt with a 8 mm AVP 4 Amplatzer plug was complicated by incomplete deployment then the plug partially embolized to the TIPS stent in an attempt at retraction. The plug was snared and removed. Access was obtained into the varix, then using a new 7 mm AVP 4 Amplatzer plug the varix was successfully embolized. Completion portal venogram demonstrates a patent TIPS endograft without significant residual filling of the varices. RIGHT atrial and portal pressures were obtained after shunt placement. The RIGHT thoracentesis and paracentesis drains, catheters and sheath were removed and manual compression was applied to the RIGHT internal jugular and greater saphenous venous access sites until hemostasis was achieved. The patient was transferred to the PACU in stable condition. Pre-TIPS Mean Pressures (mmHg): Right atrium: 8 Portal vein: 27 Portosystemic gradient: 19 Post-TIPS Mean Pressures (mmHg): Right atrium:14 Portal vein: 21 Portosystemic gradient: 7 IMPRESSION: 1. Access via the RIGHT jugular and greater saphenous veins. 2. Successful ICE-guided TIPS, via the RIGHT hepatic vein to the RIGHT portal vein, with placement of a 7+2 cm GORE Viatorr stent. 3. Reduction of portosystemic gradient from 19 to 7 mmHg 4. Successful gastroesophageal varix embolization with a single 7 mm AVP-4 Amplatzer plug. 5. Therapeutic RIGHT thoracentesis and paracentesis, with 1.5 L of serous pleural fluid and 0.6 L of ascites removed respectively. PLAN: The patient is enrolled in the [facility] portal hypertension follow-up clinic, and will be closely followed per the protocol. Roanna Banning, MD [facility] Radiology Electronically Signed   By: Roanna Banning M.D.   On: 04/17/2023 17:23   IR Paracentesis  Result Date: 04/17/2023 CLINICAL DATA:  Cirrhosis Briefly, 62 year old male with  a history of decompensated EtOH cirrhosis and refractory RIGHT hepatic hydrothorax. MELD 9 (01/27/2023). Child Pugh class B, 7 pts. FIPS prognostication favorable. EXAM: Procedures: 1. TRANSJUGULAR INTRAHEPATIC PORTOSYSTEMIC SHUNT (TIPS) 2. INTRACARDIAC ECHOCARDIOGRAPHY (ICE) 3.  GASTRO-ESOPHAGEAL VARICEAL EMBOLIZATION 4.  THERAPEUTIC RIGHT THORACENTESIS and PARACENTESIS MEDICATIONS: As antibiotic prophylaxis, Rocephin 2 gm IV was ordered pre-procedure and administered intravenously within one hour of incision. ANESTHESIA/SEDATION: Sedation by the Anesthesia Team was performed. Please see anesthesiology log for details. CONTRAST:  80 mL Omnipaque 300, intravenous FLUOROSCOPY TIME:  Fluoroscopic dose; 320 mGy COMPLICATIONS: None immediate. PROCEDURE: Informed written consent was obtained from the patient and/or patient's representative after a thorough discussion of the procedural risks, benefits and alternatives. All questions were addressed. Maximal Sterile Barrier Technique was utilized including caps, mask, sterile gowns, sterile gloves, sterile drape, hand hygiene and skin antiseptic. A timeout was performed prior to the initiation of the procedure. RIGHT THORACENTESIS Initial ultrasound scanning demonstrates a large amount of pleural effusion within the RIGHT chest. Under direct ultrasound guidance, a 6 Fr pigtail catheter was introduced. An ultrasound image was saved for documentation purposes. Therapeutic RIGHT thoracentesis was performed. PARACENTESIS Additional ultrasound scanning of the abdomen demonstrated a small volume of ascites. Under direct  ultrasound guidance, a 7 Fr pigtail catheter was introduced. An ultrasound image was saved for documentation purposes. Therapeutic paracentesis was performed. INTRACARDIAC ECHOCARDIOGRAPHY (ICE) A preliminary ultrasound of the RIGHT groin was performed and demonstrates a patent greater saphenous vein. A permanent ultrasound image was recorded. Using a combination  of fluoroscopy and ultrasound, an access site was determined. A small dermatotomy was made at the planned puncture site. Using ultrasound guidance, access into the RIGHT greater saphenous vein was obtained with visualization of needle entry into the vessel using a standard micropuncture technique. A wire was advanced into the IVC insert all fascial dilation performed. An 8 Fr, 10 cm vascular sheath was placed into the external iliac vein. Through this access site, an 8 Fr AcuNav ICE catheter was advanced with ease under fluoroscopic guidance to the level of the intrahepatic inferior vena cava. TRANSJUGULAR INTRAHEPATIC PORTOSYSTEMIC SHUNT (TIPS) A preliminary ultrasound of the RIGHT neck was performed and demonstrates a patent internal jugular vein. A permanent ultrasound image was recorded. Using a combination of fluoroscopy and ultrasound, an access site was determined. A small dermatotomy was made at the planned puncture site. Using ultrasound guidance, access into the RIGHT internal jugular vein was obtained with visualization of needle entry into the vessel using a standard micropuncture technique. A wire was advanced into the IVC and serial fascial dilation performed. A 10 Fr sheath was placed into the internal jugular vein and advanced to the IVC. The jugular sheath was retracted into the RIGHT atrium and manometry was performed. A 5 Fr angled tip catheter was then directed into the RIGHT hepatic vein. Hepatic venogram was performed. These images demonstrated patent hepatic vein with no stenosis. The catheter was advanced to a wedge portion of the a patent vein over which the sheath was advanced into the distal RIGHT hepatic vein. Using ICE ultrasound visualization the catheter as RIGHT hepatic vein as well as the portal anatomy was defined. A planned exit site from the hepatic vein and puncture site from the portal vein was placed into a single sonographic plane. Under direct ultrasound visualization, the  Argon Scorpion X needle was advanced into the central RIGHT portal vein. Hand injection of contrast confirmed position within the portal system. A Glidewire was then advanced into the superior mesenteric vein. The tract was pre dilated with an 8 mm balloon. A 5 Fr marking pigtail catheter was then advanced over the wire into the main portal vein and wire removed. Portal venogram was performed which demonstrated a patent portal vein. 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 Portal manometry was then performed. The tract was then dilated to 8 mm with an 8 mm x 8 cm Athletis balloon. A 8-10 mm by 7 + 2 cm of GORE Viatorr TIPS stent was placed. Interval portal venogram with residual filling of the gastroesophageal varices. GASTRO-ESOPHAGEAL VARICEAL EMBOLIZATION Access was obtained into the gastroesophageal varix with an angled catheter, initial embolization attempt with a 8 mm AVP 4 Amplatzer plug was complicated by incomplete deployment then the plug partially embolized to the TIPS stent in an attempt at retraction. The plug was snared and removed. Access was obtained into the varix, then using a new 7 mm AVP 4 Amplatzer plug the varix was successfully embolized. Completion portal venogram demonstrates a patent TIPS endograft without significant residual filling of the varices. RIGHT atrial and portal pressures were obtained after shunt placement. The RIGHT thoracentesis and paracentesis drains, catheters and sheath were removed and manual  compression was applied to the RIGHT internal jugular and greater saphenous venous access sites until hemostasis was achieved. The patient was transferred to the PACU in stable condition. Pre-TIPS Mean Pressures (mmHg): Right atrium: 8 Portal vein: 27 Portosystemic gradient: 19 Post-TIPS Mean Pressures (mmHg): Right atrium:14 Portal vein: 21 Portosystemic gradient: 7 IMPRESSION: 1. Access via the RIGHT jugular and  greater saphenous veins. 2. Successful ICE-guided TIPS, via the RIGHT hepatic vein to the RIGHT portal vein, with placement of a 7+2 cm GORE Viatorr stent. 3. Reduction of portosystemic gradient from 19 to 7 mmHg 4. Successful gastroesophageal varix embolization with a single 7 mm AVP-4 Amplatzer plug. 5. Therapeutic RIGHT thoracentesis and paracentesis, with 1.5 L of serous pleural fluid and 0.6 L of ascites removed respectively. PLAN: The patient is enrolled in the [facility] portal hypertension follow-up clinic, and will be closely followed per the protocol. Roanna Banning, MD [facility] Radiology Electronically Signed   By: Roanna Banning M.D.   On: 04/17/2023 17:23   IR THORACENTESIS ASP PLEURAL SPACE W/IMG GUIDE  Result Date: 04/17/2023 CLINICAL DATA:  Cirrhosis Briefly, 62 year old male with a history of decompensated EtOH cirrhosis and refractory RIGHT hepatic hydrothorax. MELD 9 (01/27/2023). Child Pugh class B, 7 pts. FIPS prognostication favorable. EXAM: Procedures: 1. TRANSJUGULAR INTRAHEPATIC PORTOSYSTEMIC SHUNT (TIPS) 2. INTRACARDIAC ECHOCARDIOGRAPHY (ICE) 3.  GASTRO-ESOPHAGEAL VARICEAL EMBOLIZATION 4.  THERAPEUTIC RIGHT THORACENTESIS and PARACENTESIS MEDICATIONS: As antibiotic prophylaxis, Rocephin 2 gm IV was ordered pre-procedure and administered intravenously within one hour of incision. ANESTHESIA/SEDATION: Sedation by the Anesthesia Team was performed. Please see anesthesiology log for details. CONTRAST:  80 mL Omnipaque 300, intravenous FLUOROSCOPY TIME:  Fluoroscopic dose; 320 mGy COMPLICATIONS: None immediate. PROCEDURE: Informed written consent was obtained from the patient and/or patient's representative after a thorough discussion of the procedural risks, benefits and alternatives. All questions were addressed. Maximal Sterile Barrier Technique was utilized including caps, mask, sterile gowns, sterile gloves, sterile drape, hand hygiene and skin  antiseptic. A timeout was performed prior to the initiation of the procedure. RIGHT THORACENTESIS Initial ultrasound scanning demonstrates a large amount of pleural effusion within the RIGHT chest. Under direct ultrasound guidance, a 6 Fr pigtail catheter was introduced. An ultrasound image was saved for documentation purposes. Therapeutic RIGHT thoracentesis was performed. PARACENTESIS Additional ultrasound scanning of the abdomen demonstrated a small volume of ascites. Under direct ultrasound guidance, a 7 Fr pigtail catheter was introduced. An ultrasound image was saved for documentation purposes. Therapeutic paracentesis was performed. INTRACARDIAC ECHOCARDIOGRAPHY (ICE) A preliminary ultrasound of the RIGHT groin was performed and demonstrates a patent greater saphenous vein. A permanent ultrasound image was recorded. Using a combination of fluoroscopy and ultrasound, an access site was determined. A small dermatotomy was made at the planned puncture site. Using ultrasound guidance, access into the RIGHT greater saphenous vein was obtained with visualization of needle entry into the vessel using a standard micropuncture technique. A wire was advanced into the IVC insert all fascial dilation performed. An 8 Fr, 10 cm vascular sheath was placed into the external iliac vein. Through this access site, an 8 Fr AcuNav ICE catheter was advanced with ease under fluoroscopic guidance to the level of the intrahepatic inferior vena cava. TRANSJUGULAR INTRAHEPATIC PORTOSYSTEMIC SHUNT (TIPS) A preliminary ultrasound of the RIGHT neck was performed and demonstrates a patent internal jugular vein. A permanent ultrasound image was recorded. Using a combination of fluoroscopy and ultrasound, an access site was determined. A small dermatotomy was made at the planned  puncture site. Using ultrasound guidance, access into the RIGHT internal jugular vein was obtained with visualization of needle entry into the vessel using a  standard micropuncture technique. A wire was advanced into the IVC and serial fascial dilation performed. A 10 Fr sheath was placed into the internal jugular vein and advanced to the IVC. The jugular sheath was retracted into the RIGHT atrium and manometry was performed. A 5 Fr angled tip catheter was then directed into the RIGHT hepatic vein. Hepatic venogram was performed. These images demonstrated patent hepatic vein with no stenosis. The catheter was advanced to a wedge portion of the a patent vein over which the sheath was advanced into the distal RIGHT hepatic vein. Using ICE ultrasound visualization the catheter as RIGHT hepatic vein as well as the portal anatomy was defined. A planned exit site from the hepatic vein and puncture site from the portal vein was placed into a single sonographic plane. Under direct ultrasound visualization, the Argon Scorpion X needle was advanced into the central RIGHT portal vein. Hand injection of contrast confirmed position within the portal system. A Glidewire was then advanced into the superior mesenteric vein. The tract was pre dilated with an 8 mm balloon. A 5 Fr marking pigtail catheter was then advanced over the wire into the main portal vein and wire removed. Portal venogram was performed which demonstrated a patent portal vein. 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 Portal manometry was then performed. The tract was then dilated to 8 mm with an 8 mm x 8 cm Athletis balloon. A 8-10 mm by 7 + 2 cm of GORE Viatorr TIPS stent was placed. Interval portal venogram with residual filling of the gastroesophageal varices. GASTRO-ESOPHAGEAL VARICEAL EMBOLIZATION Access was obtained into the gastroesophageal varix with an angled catheter, initial embolization attempt with a 8 mm AVP 4 Amplatzer plug was complicated by incomplete deployment then the plug partially embolized to the TIPS stent in an attempt at  retraction. The plug was snared and removed. Access was obtained into the varix, then using a new 7 mm AVP 4 Amplatzer plug the varix was successfully embolized. Completion portal venogram demonstrates a patent TIPS endograft without significant residual filling of the varices. RIGHT atrial and portal pressures were obtained after shunt placement. The RIGHT thoracentesis and paracentesis drains, catheters and sheath were removed and manual compression was applied to the RIGHT internal jugular and greater saphenous venous access sites until hemostasis was achieved. The patient was transferred to the PACU in stable condition. Pre-TIPS Mean Pressures (mmHg): Right atrium: 8 Portal vein: 27 Portosystemic gradient: 19 Post-TIPS Mean Pressures (mmHg): Right atrium:14 Portal vein: 21 Portosystemic gradient: 7 IMPRESSION: 1. Access via the RIGHT jugular and greater saphenous veins. 2. Successful ICE-guided TIPS, via the RIGHT hepatic vein to the RIGHT portal vein, with placement of a 7+2 cm GORE Viatorr stent. 3. Reduction of portosystemic gradient from 19 to 7 mmHg 4. Successful gastroesophageal varix embolization with a single 7 mm AVP-4 Amplatzer plug. 5. Therapeutic RIGHT thoracentesis and paracentesis, with 1.5 L of serous pleural fluid and 0.6 L of ascites removed respectively. PLAN: The patient is enrolled in the [facility] portal hypertension follow-up clinic, and will be closely followed per the protocol. Roanna Banning, MD [facility] Radiology Electronically Signed   By: Roanna Banning M.D.   On: 04/17/2023 17:23   IR US Guide Vasc Access Right  Result Date: 04/17/2023 CLINICAL DATA:  Cirrhosis Briefly,  62 year old male with a history of decompensated EtOH cirrhosis and refractory RIGHT hepatic hydrothorax. MELD 9 (01/27/2023). Child Pugh class B, 7 pts. FIPS prognostication favorable. EXAM: Procedures: 1. TRANSJUGULAR INTRAHEPATIC PORTOSYSTEMIC SHUNT (TIPS) 2.  INTRACARDIAC ECHOCARDIOGRAPHY (ICE) 3.  GASTRO-ESOPHAGEAL VARICEAL EMBOLIZATION 4.  THERAPEUTIC RIGHT THORACENTESIS and PARACENTESIS MEDICATIONS: As antibiotic prophylaxis, Rocephin 2 gm IV was ordered pre-procedure and administered intravenously within one hour of incision. ANESTHESIA/SEDATION: Sedation by the Anesthesia Team was performed. Please see anesthesiology log for details. CONTRAST:  80 mL Omnipaque 300, intravenous FLUOROSCOPY TIME:  Fluoroscopic dose; 320 mGy COMPLICATIONS: None immediate. PROCEDURE: Informed written consent was obtained from the patient and/or patient's representative after a thorough discussion of the procedural risks, benefits and alternatives. All questions were addressed. Maximal Sterile Barrier Technique was utilized including caps, mask, sterile gowns, sterile gloves, sterile drape, hand hygiene and skin antiseptic. A timeout was performed prior to the initiation of the procedure. RIGHT THORACENTESIS Initial ultrasound scanning demonstrates a large amount of pleural effusion within the RIGHT chest. Under direct ultrasound guidance, a 6 Fr pigtail catheter was introduced. An ultrasound image was saved for documentation purposes. Therapeutic RIGHT thoracentesis was performed. PARACENTESIS Additional ultrasound scanning of the abdomen demonstrated a small volume of ascites. Under direct ultrasound guidance, a 7 Fr pigtail catheter was introduced. An ultrasound image was saved for documentation purposes. Therapeutic paracentesis was performed. INTRACARDIAC ECHOCARDIOGRAPHY (ICE) A preliminary ultrasound of the RIGHT groin was performed and demonstrates a patent greater saphenous vein. A permanent ultrasound image was recorded. Using a combination of fluoroscopy and ultrasound, an access site was determined. A small dermatotomy was made at the planned puncture site. Using ultrasound guidance, access into the RIGHT greater saphenous vein was obtained with visualization of needle entry  into the vessel using a standard micropuncture technique. A wire was advanced into the IVC insert all fascial dilation performed. An 8 Fr, 10 cm vascular sheath was placed into the external iliac vein. Through this access site, an 8 Fr AcuNav ICE catheter was advanced with ease under fluoroscopic guidance to the level of the intrahepatic inferior vena cava. TRANSJUGULAR INTRAHEPATIC PORTOSYSTEMIC SHUNT (TIPS) A preliminary ultrasound of the RIGHT neck was performed and demonstrates a patent internal jugular vein. A permanent ultrasound image was recorded. Using a combination of fluoroscopy and ultrasound, an access site was determined. A small dermatotomy was made at the planned puncture site. Using ultrasound guidance, access into the RIGHT internal jugular vein was obtained with visualization of needle entry into the vessel using a standard micropuncture technique. A wire was advanced into the IVC and serial fascial dilation performed. A 10 Fr sheath was placed into the internal jugular vein and advanced to the IVC. The jugular sheath was retracted into the RIGHT atrium and manometry was performed. A 5 Fr angled tip catheter was then directed into the RIGHT hepatic vein. Hepatic venogram was performed. These images demonstrated patent hepatic vein with no stenosis. The catheter was advanced to a wedge portion of the a patent vein over which the sheath was advanced into the distal RIGHT hepatic vein. Using ICE ultrasound visualization the catheter as RIGHT hepatic vein as well as the portal anatomy was defined. A planned exit site from the hepatic vein and puncture site from the portal vein was placed into a single sonographic plane. Under direct ultrasound visualization, the Argon Scorpion X needle was advanced into the central RIGHT portal vein. Hand injection of contrast confirmed position within the portal system. A Glidewire was then advanced into  the superior mesenteric vein. The tract was pre dilated with an  8 mm balloon. A 5 Fr marking pigtail catheter was then advanced over the wire into the main portal vein and wire removed. Portal venogram was performed which demonstrated a patent portal vein. 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 Portal manometry was then performed. The tract was then dilated to 8 mm with an 8 mm x 8 cm Athletis balloon. A 8-10 mm by 7 + 2 cm of GORE Viatorr TIPS stent was placed. Interval portal venogram with residual filling of the gastroesophageal varices. GASTRO-ESOPHAGEAL VARICEAL EMBOLIZATION Access was obtained into the gastroesophageal varix with an angled catheter, initial embolization attempt with a 8 mm AVP 4 Amplatzer plug was complicated by incomplete deployment then the plug partially embolized to the TIPS stent in an attempt at retraction. The plug was snared and removed. Access was obtained into the varix, then using a new 7 mm AVP 4 Amplatzer plug the varix was successfully embolized. Completion portal venogram demonstrates a patent TIPS endograft without significant residual filling of the varices. RIGHT atrial and portal pressures were obtained after shunt placement. The RIGHT thoracentesis and paracentesis drains, catheters and sheath were removed and manual compression was applied to the RIGHT internal jugular and greater saphenous venous access sites until hemostasis was achieved. The patient was transferred to the PACU in stable condition. Pre-TIPS Mean Pressures (mmHg): Right atrium: 8 Portal vein: 27 Portosystemic gradient: 19 Post-TIPS Mean Pressures (mmHg): Right atrium:14 Portal vein: 21 Portosystemic gradient: 7 IMPRESSION: 1. Access via the RIGHT jugular and greater saphenous veins. 2. Successful ICE-guided TIPS, via the RIGHT hepatic vein to the RIGHT portal vein, with placement of a 7+2 cm GORE Viatorr stent. 3. Reduction of portosystemic gradient from 19 to 7 mmHg 4. Successful gastroesophageal  varix embolization with a single 7 mm AVP-4 Amplatzer plug. 5. Therapeutic RIGHT thoracentesis and paracentesis, with 1.5 L of serous pleural fluid and 0.6 L of ascites removed respectively. PLAN: The patient is enrolled in the [facility] portal hypertension follow-up clinic, and will be closely followed per the protocol. Roanna Banning, MD [facility] Inter[facility] Radiology Special[facility] Radiology Electronically Signed   By: Roanna Banning M.D.   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 MELD 9 (01/27/2023). Child Pugh class B, 7 pts. FIPS prognostication favorable. EXAM: Procedures: 1. TRANSJUGULAR INTRAHEPATIC PORTOSYSTEMIC SHUNT (TIPS) 2. INTRACARDIAC ECHOCARDIOGRAPHY (ICE) 3.  GASTRO-ESOPHAGEAL VARICEAL EMBOLIZATION 4.  THERAPEUTIC RIGHT THORACENTESIS and PARACENTESIS MEDICATIONS: As antibiotic prophylaxis, Rocephin 2 gm IV was ordered pre-procedure and administered intravenously within one hour of incision. ANESTHESIA/SEDATION: Sedation by the Anesthesia Team was performed. Please see anesthesiology log for details. CONTRAST:  80 mL Omnipaque 300, intravenous FLUOROSCOPY TIME:  Fluoroscopic dose; 320 mGy COMPLICATIONS: None immediate. PROCEDURE: Informed written consent was obtained from the patient and/or patient's representative after a thorough discussion of the procedural risks, benefits and alternatives. All questions were addressed. Maximal Sterile Barrier Technique was utilized including caps, mask, sterile gowns, sterile gloves, sterile drape, hand hygiene and skin antiseptic. A timeout was performed prior to the initiation of the procedure. RIGHT THORACENTESIS Initial ultrasound scanning demonstrates a large amount of pleural effusion within the RIGHT chest. Under direct ultrasound guidance, a 6 Fr pigtail catheter was  introduced. An ultrasound image was saved for documentation  purposes. Therapeutic RIGHT thoracentesis was performed. PARACENTESIS Additional ultrasound scanning of the abdomen demonstrated a small volume of ascites. Under direct ultrasound guidance, a 7 Fr pigtail catheter was introduced. An ultrasound image was saved for documentation purposes. Therapeutic paracentesis was performed. INTRACARDIAC ECHOCARDIOGRAPHY (ICE) A preliminary ultrasound of the RIGHT groin was performed and demonstrates a patent greater saphenous vein. A permanent ultrasound image was recorded. Using a combination of fluoroscopy and ultrasound, an access site was determined. A small dermatotomy was made at the planned puncture site. Using ultrasound guidance, access into the RIGHT greater saphenous vein was obtained with visualization of needle entry into the vessel using a standard micropuncture technique. A wire was advanced into the IVC insert all fascial dilation performed. An 8 Fr, 10 cm vascular sheath was placed into the external iliac vein. Through this access site, an 8 Fr AcuNav ICE catheter was advanced with ease under fluoroscopic guidance to the level of the intrahepatic inferior vena cava. TRANSJUGULAR INTRAHEPATIC PORTOSYSTEMIC SHUNT (TIPS) A preliminary ultrasound of the RIGHT neck was performed and demonstrates a patent internal jugular vein. A permanent ultrasound image was recorded. Using a combination of fluoroscopy and ultrasound, an access site was determined. A small dermatotomy was made at the planned puncture site. Using ultrasound guidance, access into the RIGHT internal jugular vein was obtained with visualization of needle entry into the vessel using a standard micropuncture technique. A wire was advanced into the IVC and serial fascial dilation performed. A 10 Fr sheath was placed into the internal jugular vein and advanced to the IVC. The jugular sheath was retracted into the RIGHT atrium and manometry was  performed. A 5 Fr angled tip catheter was then directed into the RIGHT hepatic vein. Hepatic venogram was performed. These images demonstrated patent hepatic vein with no stenosis. The catheter was advanced to a wedge portion of the a patent vein over which the sheath was advanced into the distal RIGHT hepatic vein. Using ICE ultrasound visualization the catheter as RIGHT hepatic vein as well as the portal anatomy was defined. A planned exit site from the hepatic vein and puncture site from the portal vein was placed into a single sonographic plane. Under direct ultrasound visualization, the Argon Scorpion X needle was advanced into the central RIGHT portal vein. Hand injection of contrast confirmed position within the portal system. A Glidewire was then advanced into the superior mesenteric vein. The tract was pre dilated with an 8 mm balloon. A 5 Fr marking pigtail catheter was then advanced over the wire into the main portal vein and wire removed. Portal venogram was performed which demonstrated a patent portal vein. 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 Portal manometry was then performed. The tract was then dilated to 8 mm with an 8 mm x 8 cm Athletis balloon. A 8-10 mm by 7 + 2 cm of GORE Viatorr TIPS stent was placed. Interval portal venogram with residual filling of the gastroesophageal varices. GASTRO-ESOPHAGEAL VARICEAL EMBOLIZATION Access was obtained into the gastroesophageal varix with an angled catheter, initial embolization attempt with a 8 mm AVP 4 Amplatzer plug was complicated by incomplete deployment then the plug partially embolized to the TIPS stent in an attempt at retraction. The plug was snared and removed. Access was obtained into the varix, then using a new 7 mm AVP 4 Amplatzer plug the varix was successfully embolized. Completion portal venogram demonstrates a patent TIPS endograft without significant residual filling of  the varices. RIGHT atrial  and portal pressures were obtained after shunt placement. The RIGHT thoracentesis and paracentesis drains, catheters and sheath were removed and manual compression was applied to the RIGHT internal jugular and greater saphenous venous access sites until hemostasis was achieved. The patient was transferred to the PACU in stable condition. Pre-TIPS Mean Pressures (mmHg): Right atrium: 8 Portal vein: 27 Portosystemic gradient: 19 Post-TIPS Mean Pressures (mmHg): Right atrium:14 Portal vein: 21 Portosystemic gradient: 7 IMPRESSION: 1. Access via the RIGHT jugular and greater saphenous veins. 2. Successful ICE-guided TIPS, via the RIGHT hepatic vein to the RIGHT portal vein, with placement of a 7+2 cm GORE Viatorr stent. 3. Reduction of portosystemic gradient from 19 to 7 mmHg 4. Successful gastroesophageal varix embolization with a single 7 mm AVP-4 Amplatzer plug. 5. Therapeutic RIGHT thoracentesis and paracentesis, with 1.5 L of serous pleural fluid and 0.6 L of ascites removed respectively. PLAN: The patient is enrolled in the [facility] portal hypertension follow-up clinic, and will be closely followed per the protocol. Roanna Banning, MD Vascular and Interventional Radiology Specialists [facility] Radiology Electronically Signed   By: Roanna Banning M.D.   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 MELD 9 (01/27/2023). Child Pugh class B, 7 pts. FIPS prognostication favorable. EXAM: Procedures: 1. TRANSJUGULAR INTRAHEPATIC PORTOSYSTEMIC SHUNT (TIPS) 2. INTRACARDIAC ECHOCARDIOGRAPHY (ICE) 3.  GASTRO-ESOPHAGEAL VARICEAL EMBOLIZATION 4.  THERAPEUTIC RIGHT THORACENTESIS and PARACENTESIS MEDICATIONS: As antibiotic prophylaxis, Rocephin 2 gm IV was ordered pre-procedure and administered intravenously  within one hour of incision. ANESTHESIA/SEDATION: Sedation by the Anesthesia Team was performed. Please see anesthesiology log for details. CONTRAST:  80 mL Omnipaque 300, intravenous FLUOROSCOPY TIME:  Fluoroscopic dose; 320 mGy COMPLICATIONS: None immediate. PROCEDURE: Informed written consent was obtained from the patient and/or patient's representative after a thorough discussion of the procedural risks, benefits and alternatives. All questions were addressed. Maximal Sterile Barrier Technique was utilized including caps, mask, sterile gowns, sterile gloves, sterile drape, hand hygiene and skin antiseptic. A timeout was performed prior to the initiation of the procedure. RIGHT THORACENTESIS Initial ultrasound scanning demonstrates a large amount of pleural effusion within the RIGHT chest. Under direct ultrasound guidance, a 6 Fr pigtail catheter was introduced. An ultrasound image was saved for documentation purposes. Therapeutic RIGHT thoracentesis was performed. PARACENTESIS Additional ultrasound scanning of the abdomen demonstrated a small volume of ascites. Under direct ultrasound guidance, a 7 Fr pigtail catheter was introduced. An ultrasound image was saved for documentation purposes. Therapeutic paracentesis was performed. INTRACARDIAC ECHOCARDIOGRAPHY (ICE) A preliminary ultrasound of the RIGHT groin was performed and demonstrates a patent greater saphenous vein. A permanent ultrasound image was recorded. Using a combination of fluoroscopy and ultrasound, an access site was determined. A small dermatotomy was made at the planned puncture site. Using ultrasound guidance, access into the RIGHT greater saphenous vein was obtained with visualization of needle entry into the vessel using a standard micropuncture technique. A wire was advanced into the IVC insert all fascial dilation performed. An 8 Fr, 10 cm vascular sheath was placed into the external iliac vein. Through this access site, an 8 Fr AcuNav ICE  catheter was advanced with ease under fluoroscopic guidance to the level of the intrahepatic inferior vena cava. TRANSJUGULAR INTRAHEPATIC PORTOSYSTEMIC SHUNT (TIPS) A preliminary ultrasound of the RIGHT neck was performed and demonstrates a patent internal jugular vein. A permanent  ultrasound image was recorded. Using a combination of fluoroscopy and ultrasound, an access site was determined. A small dermatotomy was made at the planned puncture site. Using ultrasound guidance, access into the RIGHT internal jugular vein was obtained with visualization of needle entry into the vessel using a standard micropuncture technique. A wire was advanced into the IVC and serial fascial dilation performed. A 10 Fr sheath was placed into the internal jugular vein and advanced to the IVC. The jugular sheath was retracted into the RIGHT atrium and manometry was performed. A 5 Fr angled tip catheter was then directed into the RIGHT hepatic vein. Hepatic venogram was performed. These images demonstrated patent hepatic vein with no stenosis. The catheter was advanced to a wedge portion of the a patent vein over which the sheath was advanced into the distal RIGHT hepatic vein. Using ICE ultrasound visualization the catheter as RIGHT hepatic vein as well as the portal anatomy was defined. A planned exit site from the hepatic vein and puncture site from the portal vein was placed into a single sonographic plane. Under direct ultrasound visualization, the Argon Scorpion X needle was advanced into the central RIGHT portal vein. Hand injection of contrast confirmed position within the portal system. A Glidewire was then advanced into the superior mesenteric vein. The tract was pre dilated with an 8 mm balloon. A 5 Fr marking pigtail catheter was then advanced over the wire into the main portal vein and wire removed. Portal venogram was performed which demonstrated a patent portal vein. Portosystemic collaterals including gastroesophageal  varices were seen arising from the main portal vein. Portal manometry was then performed. The tract was then dilated to 8 mm with an 8 mm x 8 cm Athletis balloon. A 8-10 mm by 7 + 2 cm of GORE Viatorr TIPS stent was placed. Interval portal venogram with residual filling of the gastroesophageal varices. GASTRO-ESOPHAGEAL VARICEAL EMBOLIZATION Access was obtained into the gastroesophageal varix with an angled catheter, initial embolization attempt with a 8 mm AVP 4 Amplatzer plug was complicated by incomplete deployment then the plug partially embolized to the TIPS stent in an attempt at retraction. The plug was snared and removed. Access was obtained into the varix, then using a new 7 mm AVP 4 Amplatzer plug the varix was successfully embolized. Completion portal venogram demonstrates a patent TIPS endograft without significant residual filling of the varices. RIGHT atrial and portal pressures were obtained after shunt placement. The RIGHT thoracentesis and paracentesis drains, catheters and sheath were removed and manual compression was applied to the RIGHT internal jugular and greater saphenous venous access sites until hemostasis was achieved. The patient was transferred to the PACU in stable condition. Pre-TIPS Mean Pressures (mmHg): Right atrium: 8 Portal vein: 27 Portosystemic gradient: 19 Post-TIPS Mean Pressures (mmHg): Right atrium:14 Portal vein: 21 Portosystemic gradient: 7 IMPRESSION: 1. Access via the RIGHT jugular and greater saphenous veins. 2. Successful ICE-guided TIPS, via the RIGHT hepatic vein to the RIGHT portal vein, with placement of a 7+2 cm GORE Viatorr stent. 3. Reduction of portosystemic gradient from 19 to 7 mmHg 4. Successful gastroesophageal varix embolization with a single 7 mm AVP-4 Amplatzer plug. 5. Therapeutic RIGHT thoracentesis and paracentesis, with 1.5 L of serous pleural fluid and 0.6 L of ascites removed respectively. PLAN: The patient is enrolled in the [facility] portal  hypertension follow-up clinic, and will be closely followed per the protocol. Roanna Banning, MD Vascular and Interventional Radiology Specialists [facility] Radiology Electronically Signed   By: Cletis Athens  Mugweru M.D.   On: 04/17/2023 17:23   DG Chest Port 1 View  Result Date: 04/17/2023 CLINICAL DATA:  62 year old male status post tips EXAM: PORTABLE CHEST 1 VIEW COMPARISON:  04/13/2023 FINDINGS: Cardiomediastinal silhouette unchanged in size and contour. Left chest wall pacing device/AICD unchanged. Veiled opacity of the right lung, with improved aeration compared to the prior plain film. No meniscus. No pneumothorax.  No left-sided pleural effusion. Partial visualization of transjugular intrahepatic portosystemic shunt in the upper abdomen, as well as embolization of varices in the left upper abdomen. IMPRESSION: No complicating features status post tips Filled opacity of the right hemithorax compatible with residual right pleural fluid though improved in aeration compared to the prior. Electronically Signed   By: Gilmer Mor D.O.   On: 04/17/2023 16:14   IR Paracentesis  Result Date: 04/14/2023 INDICATION: Patient with history of cirrhosis with recurrent pleural effusion. Found to have ascites. Request is for therapeutic and diagnostic paracentesis EXAM: ULTRASOUND GUIDED THERAPEUTIC AND DIAGNOSTIC PARACENTESIS MEDICATIONS: Lidocaine 1% 10 mL COMPLICATIONS: None immediate. PROCEDURE: Informed written consent was obtained from the patient after a discussion of the risks, benefits and alternatives to treatment. A timeout was performed prior to the initiation of the procedure. Initial ultrasound scanning demonstrates a small amount of ascites within the left lower abdominal quadrant. The lung lower abdomen was prepped and draped in the usual sterile fashion. 1% lidocaine was used for local anesthesia. Following this, a 19 gauge, 7-cm, Yueh catheter was introduced. An ultrasound image was saved for  documentation purposes. The paracentesis was performed. The catheter was removed and a dressing was applied. The patient tolerated the procedure well without immediate post procedural complication. FINDINGS: A total of approximately 1.6 L of pale yellow fluid was removed. Samples were sent to the laboratory as requested by the clinical team. IMPRESSION: Successful ultrasound-guided therapeutic and diagnostic left side paracentesis yielding 1.2 liters of peritoneal fluid. Performed by Anders Grant NP PLAN: If the patient eventually requires >/=2 paracenteses in a 30 day period, candidacy for formal evaluation by the [facility] will be assessed. Electronically Signed   By: Malachy Moan M.D.   On: 04/14/2023 13:26   ECHOCARDIOGRAM COMPLETE  Result Date: 04/13/2023    ECHOCARDIOGRAM REPORT   Patient Name:   Connor Vaughn Date of Exam: 04/13/2023 Medical Rec #:  [id number]          Height:       66.0 in Accession #:    [id number]         Weight:       155.9 lb Date of Birth:  Oct 23, 1960          BSA:          1.799 m Patient Age:    62 years           BP:           114/84 mmHg Patient Gender: M                  HR:           86 bpm. Exam Location:  Inpatient Procedure: 2D Echo, Cardiac Doppler and Color Doppler Indications:    Pre-op  History:        Patient has prior history of Echocardiogram examinations, most                 recent 01/26/2023. Hypertrophic Cardiomyopathy; Risk  Factors:Diabetes.  Sonographer:    JLS Referring Phys: Lannette Donath SUE-ELLEN MATTHEWS IMPRESSIONS  1. Left ventricular ejection fraction, by estimation, is 60 to 65%. The left ventricle has normal function. The left ventricle has no regional wall motion abnormalities. Left ventricular diastolic parameters are indeterminate.  2. Right ventricular systolic function is normal. The right ventricular size is normal. There is normal pulmonary artery systolic pressure. The  estimated right ventricular systolic pressure is 29.2 mmHg.  3. The mitral valve is normal in structure. Trivial mitral valve regurgitation. No evidence of mitral stenosis.  4. The aortic valve is calcified. Aortic valve regurgitation is not visualized. Moderate to severe aortic valve stenosis. Aortic valve area, by VTI measures 0.85 cm. Aortic valve mean gradient measures 23.6 mmHg. Aortic valve Vmax measures 3.24 m/s. Although the mean AVG and VMax are more c/w mild to moderate AS, the DVI is low at 0.25 and SVI low at 33. 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  5. The inferior vena cava is normal in size with greater than 50% respiratory variability, suggesting right atrial pressure of 3 mmHg. FINDINGS  Left Ventricle: Left ventricular ejection fraction, by estimation, is 60 to 65%. The left ventricle has normal function. The left ventricle has no regional wall motion abnormalities. The left ventricular internal cavity size was normal in size. There is  no left ventricular hypertrophy. Left ventricular diastolic parameters are indeterminate. Normal left ventricular filling pressure. Right Ventricle: The right ventricular size is normal. No increase in right ventricular wall thickness. Right ventricular systolic function is normal. There is normal pulmonary artery systolic pressure. The tricuspid regurgitant velocity is 2.56 m/s, and  with an assumed right atrial pressure of 3 mmHg, the estimated right ventricular systolic pressure is 29.2 mmHg. Left Atrium: Left atrial size was normal in size. Right Atrium: Right atrial size was normal in size. Pericardium: There is no evidence of pericardial effusion. Mitral Valve: The mitral valve is normal in structure. Trivial mitral valve regurgitation. No evidence of mitral valve stenosis. MV peak gradient, 3.4 mmHg. The mean mitral valve gradient is 2.0 mmHg. Tricuspid Valve: The tricuspid valve is normal in  structure. Tricuspid valve regurgitation is trivial. No evidence of tricuspid stenosis. Aortic Valve: The aortic valve is calcified. Aortic valve regurgitation is not visualized. Moderate to severe aortic stenosis is present. Aortic valve mean gradient measures 23.6 mmHg. Aortic valve peak gradient measures 41.9 mmHg. Aortic valve area, by VTI measures 0.85 cm. Pulmonic Valve: The pulmonic valve was normal in structure. Pulmonic valve regurgitation is not visualized. No evidence of pulmonic stenosis. Aorta: The aortic root is normal in size and structure. Venous: The inferior vena cava is normal in size with greater than 50% respiratory variability, suggesting right atrial pressure of 3 mmHg. IAS/Shunts: No atrial level shunt detected by color flow Doppler. Additional Comments: A device lead is visualized.  LEFT VENTRICLE PLAX 2D LVIDd:         3.40 cm      Diastology LVIDs:         2.40 cm      LV e' medial:    6.20 cm/s LV PW:         0.90 cm      LV E/e' medial:  12.1 LV IVS:        0.90 cm      LV e' lateral:   12.80 cm/s LVOT diam:     2.10 cm  LV E/e' lateral: 5.9 LV SV:         59 LV SV Index:   33 LVOT Area:     3.46 cm  LV Volumes (MOD) LV vol d, MOD A2C: 104.0 ml LV vol d, MOD A4C: 105.0 ml LV vol s, MOD A2C: 35.8 ml LV vol s, MOD A4C: 48.7 ml LV SV MOD A2C:     68.2 ml LV SV MOD A4C:     105.0 ml LV SV MOD BP:      63.0 ml RIGHT VENTRICLE RV Basal diam:  3.40 cm RV Mid diam:    3.00 cm RV S prime:     13.90 cm/s TAPSE (M-mode): 2.5 cm LEFT ATRIUM             Index        RIGHT ATRIUM           Index LA diam:        3.30 cm 1.83 cm/m   RA Area:     12.30 cm LA Vol (A2C):   32.4 ml 18.01 ml/m  RA Volume:   29.60 ml  16.46 ml/m LA Vol (A4C):   15.7 ml 8.73 ml/m LA Biplane Vol: 24.0 ml 13.34 ml/m  AORTIC VALVE                     PULMONIC VALVE AV Area (Vmax):    0.88 cm      PV Vmax:       0.78 m/s AV Area (Vmean):   0.79 cm      PV Peak grad:  2.4 mmHg AV Area (VTI):     0.85 cm AV Vmax:            323.60 cm/s AV Vmean:          228.000 cm/s AV VTI:            0.686 m AV Peak Grad:      41.9 mmHg AV Mean Grad:      23.6 mmHg LVOT Vmax:         82.50 cm/s LVOT Vmean:        52.100 cm/s LVOT VTI:          0.169 m LVOT/AV VTI ratio: 0.25  AORTA Ao Root diam: 3.40 cm Ao Asc diam:  3.30 cm MITRAL VALVE               TRICUSPID VALVE MV Area (PHT): 4.89 cm    TR Peak grad:   26.2 mmHg MV Area VTI:   3.48 cm    TR Vmax:        256.00 cm/s MV Peak grad:  3.4 mmHg MV Mean grad:  2.0 mmHg    SHUNTS MV Vmax:       0.92 m/s    Systemic VTI:  0.17 m MV Vmean:      63.5 cm/s   Systemic Diam: 2.10 cm MV Decel Time: 155 msec MV E velocity: 74.90 cm/s MV A velocity: 72.70 cm/s MV E/A ratio:  1.03 Armanda Magic MD Electronically signed by Armanda Magic MD Signature Date/Time: 04/13/2023/2:31:06 PM    Final    DG Chest 1 View  Result Date: 04/13/2023 CLINICAL DATA:  62 year old with large right pleural effusion and status post right thoracentesis. EXAM: CHEST  1 VIEW COMPARISON:  04/12/2023 FINDINGS: Aeration in the right lung has slightly improved. There is still a large amount of residual right pleural fluid and compressive  atelectasis in the right lung. Left lung remains clear. Left chest ICD is again noted. Heart size is stable. Negative for a pneumothorax. IMPRESSION: 1. Negative for pneumothorax. 2. Improved aeration in the right lung following right thoracentesis. Large amount of residual right pleural fluid. Electronically Signed   By: Richarda Overlie M.D.   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  Request for right thoracentesis. EXAM: ULTRASOUND GUIDED RIGHT THORACENTESIS MEDICATIONS: 10 mL 1% lidocaine COMPLICATIONS: None immediate. PROCEDURE: An ultrasound guided thoracentesis was thoroughly discussed with the patient and questions answered. The benefits, risks, alternatives and complications were also  discussed. The patient understands and wishes to proceed with the procedure. Written consent was obtained. Ultrasound was performed to localize and mark an adequate pocket of fluid in the right chest. The area was then prepped and draped in the normal sterile fashion. 1% Lidocaine was used for local anesthesia. Under ultrasound guidance a 6 Fr Safe-T-Centesis catheter was introduced. Thoracentesis was performed. The catheter was removed and a dressing applied. FINDINGS: A total of approximately 2.4 liters of yellow fluid was removed. Samples were sent to the laboratory as requested by the clinical team. IMPRESSION: Successful ultrasound guided right thoracentesis yielding 2.4 liters of pleural fluid. Performed by: Loyce Dys PA-C Electronically Signed   By: Richarda Overlie M.D.   On: 04/13/2023 13:53   CT CHEST ABDOMEN PELVIS W CONTRAST  Result Date: 04/12/2023 CLINICAL DATA:  Hepatic hydrothorax, recurrent pleural effusion cirrhosis, distended abdomen, groin and leg swelling EXAM: CT CHEST, ABDOMEN, AND PELVIS WITH CONTRAST TECHNIQUE: Multidetector CT imaging of the chest, abdomen and pelvis was performed following the standard protocol during bolus administration of intravenous contrast. RADIATION DOSE REDUCTION: This exam was performed according to the departmental dose-optimization program which includes automated exposure control, adjustment of the mA and/or kV according to patient size and/or use of iterative reconstruction technique. CONTRAST:  75mL OMNIPAQUE IOHEXOL 350 MG/ML SOLN COMPARISON:  01/26/2023 FINDINGS: CT CHEST FINDINGS Cardiovascular: Left chest pacer defibrillator. Aortic valve calcifications. Scattered aortic atherosclerosis. Normal heart size. Left coronary artery calcifications. No pericardial effusion. Mediastinum/Nodes: No enlarged mediastinal, hilar, or axillary lymph nodes. Thyroid gland, trachea, and esophagus demonstrate no significant findings. Lungs/Pleura: Large right pleural  effusion and associated atelectasis or consolidation. No left-sided pleural effusion. Left lung is normally aerated. Musculoskeletal: Bilateral gynecomastia.  No acute osseous findings. CT ABDOMEN PELVIS FINDINGS Hepatobiliary: Coarse, nodular cirrhotic morphology of the liver. No focal liver lesion. No gallstones, gallbladder wall thickening, or biliary dilatation. Pancreas: Calcifications within the pancreatic head, stigmata of chronic pancreatitis (series 3, image 70). No pancreatic ductal dilatation or surrounding inflammatory changes. Spleen: Splenomegaly, maximum coronal span 15.9 cm Adrenals/Urinary Tract: Adrenal glands are unremarkable. Kidneys are normal, without renal calculi, solid lesion, or hydronephrosis. Bladder is unremarkable. Stomach/Bowel: Stomach is within normal limits. Appendix appears normal. No evidence of bowel wall thickening, distention, or inflammatory changes. Vascular/Lymphatic: Aortic atherosclerosis. Gastroesophageal varices (series 3, image 46). No enlarged abdominal or pelvic lymph nodes. Reproductive: No mass or other abnormality. Other: Anasarca. Large, fluid containing right inguinal hernia large volume ascites throughout the abdomen and pelvis, somewhat increased compared to prior examination. Musculoskeletal: No acute osseous findings. IMPRESSION: 1. Large right pleural effusion and associated atelectasis or consolidation. 2. Cirrhosis and portal hypertension. No focal liver lesion. 3. Large volume ascites throughout the abdomen and pelvis, somewhat increased compared to prior examination. Anasarca. 4. Large, fluid containing right  inguinal hernia. 5. Coronary artery disease. Aortic Atherosclerosis (ICD10-I70.0). Electronically Signed   By: Jearld Lesch M.D.   On: 04/12/2023 17:33   DG Chest 2 View  Result Date: 04/12/2023 CLINICAL DATA:  Chest pain. EXAM: CHEST - 2 VIEW COMPARISON:  03/20/2023. FINDINGS: Since the prior study, there is opacification of the lower 3/4 of  the right hemithorax. Findings may represent large pleural effusion and underlying compressive atelectatic changes of the right lung with or without associated consolidation. There is probable layering and more atelectasis in the right upper lung zone. Left lung and left costophrenic angle are clear. Evaluation of cardiomediastinal silhouette is nondiagnostic due to right lower hemithorax opacification. There is a left sided ICD device. No acute osseous abnormalities. The soft tissues are within normal limits. IMPRESSION: *Opacification of right hemithorax with sparing of the right upper lung zone, as described above. Electronically Signed   By: Jules Schick M.D.   On: 04/12/2023 15:41   IR Radiologist Eval & Mgmt  Result Date: 03/31/2023 EXAM: NEW PATIENT OFFICE VISIT CHIEF COMPLAINT: See below HISTORY OF PRESENT ILLNESS: See below REVIEW OF SYSTEMS: See below PHYSICAL EXAMINATION: See below ASSESSMENT AND PLAN: Please refer to completed note in the electronic medical record on [place] Epic Roanna Banning, MD [facility] Electronically Signed   By: Roanna Banning M.D.   On: 03/31/2023 16:45   CUP PACEART INCLINIC DEVICE CHECK  Result Date: 03/21/2023 ICD check in clinic. Normal device function. Thresholds and sensing consistent with previous device measurements. Impedance trends stable over time. No mode switches. No ventricular arrhythmias. Histograms with R shift chronically. No changes made this session. Device programmed at appropriate safety margins. Estimated longevity 100%.  Pt enrolled in remote follow-up. Patient education completed See attached    Subjective: - no chest pain, shortness of breath, no abdominal pain, nausea or vomiting.   Discharge Exam: BP 102/71 (BP Location: Left Arm)   Pulse 89   Temp 97.8 F (36.6 C) (Oral)   Resp 18   Ht 5\' 6"  (1.676 m)   Wt 70.7 kg   SpO2 98%   BMI 25.16 kg/m   General: Pt is  alert, awake, not in acute distress Cardiovascular: RRR, S1/S2 +, no rubs, no gallops Respiratory: CTA bilaterally, no wheezing, no rhonchi Abdominal: Soft, NT, ND, bowel sounds + Extremities: no edema, no cyanosis    The results of significant diagnostics from this hospitalization (including imaging, microbiology, ancillary and laboratory) are listed below for reference.     Microbiology: Recent Results (from the past 240 hour(s))  SARS Coronavirus 2 by RT PCR (hospital order, performed in [facility] hospital lab) *cepheid single result test* Anterior Nasal Swab     Status: None   Collection Time: 04/13/23  2:21 AM   Specimen: Anterior Nasal Swab  Result Value Ref Range Status   SARS Coronavirus 2 by RT PCR NEGATIVE NEGATIVE Final    Comment: Performed at [facility], [address], [place], [place] [address]  Gram stain     Status: None   Collection Time: 04/13/23 11:54 AM   Specimen: Lung, Right; Pleural Fluid  Result Value Ref Range Status   Specimen Description PLEURAL  Final   Special Requests right lung  Final   Gram Stain   Final    WBC PRESENT, PREDOMINANTLY MONONUCLEAR NO ORGANISMS SEEN CYTOSPIN SMEAR Performed at [facility], [address], [place], [place] [address]    Report  Status 04/13/2023 FINAL  Final  Acid Fast Smear (AFB)     Status: None   Collection Time: 04/13/23 11:54 AM   Specimen: Lung, Right; Pleural Fluid  Result Value Ref Range Status   AFB Specimen Processing Concentration  Final   Acid Fast Smear Negative  Final    Comment: (NOTE) Performed At: [facility] [address] [place], [place] [id number] Jolene Schimke MD JX:[id number]    Source (AFB) PLEURAL  Final    Comment: Performed at [facility], [address], [place], [place] [address]  Culture, body fluid w Gram Stain-bottle     Status: None   Collection Time: 04/13/23 11:54 AM   Specimen: Pleura  Result Value Ref Range Status   Specimen Description  PLEURAL  Final   Special Requests right lung  Final   Culture   Final    NO GROWTH 5 DAYS Performed at [facility], [address], [place], [place] [address]    Report Status 04/18/2023 FINAL  Final  Culture, body fluid w Gram Stain-bottle     Status: None   Collection Time: 04/14/23 10:50 AM   Specimen: Peritoneal Washings  Result Value Ref Range Status   Specimen Description PERITONEAL  Final   Special Requests NONE  Final   Culture   Final    NO GROWTH 5 DAYS Performed at [facility], [address] [place], [place] [address]    Report Status 04/19/2023 FINAL  Final  Gram stain     Status: None   Collection Time: 04/14/23 10:50 AM   Specimen: Peritoneal Washings  Result Value Ref Range Status   Specimen Description PERITONEAL  Final   Special Requests NONE  Final   Gram Stain   Final    WBC PRESENT, PREDOMINANTLY MONONUCLEAR NO ORGANISMS SEEN CYTOSPIN SMEAR Performed at [facility], [address] [place], [place] [address]    Report Status 04/14/2023 FINAL  Final     Labs: Basic Metabolic Panel: Recent Labs  Lab 04/14/23 0547 04/15/23 0858 04/17/23 1007 04/17/23 1213 04/17/23 1636 04/18/23 0419 04/19/23 0408  NA 132* 132* 139 140 136 137 135  K 3.5 4.1 3.5 3.8 4.0 4.3 3.7  CL 102 99  --   --  103 104 102  CO2 25 27  --   --  24 23 27   GLUCOSE 221* 236*  --   --  204* 278* 293*  BUN 11 14  --   --  11 12 9   CREATININE 0.72 0.84  --   --  0.87 1.09 0.83  CALCIUM 8.3* 8.8*  --   --  8.1* 8.5* 8.1*   Liver Function Tests: Recent Labs  Lab 04/14/23 0547 04/15/23 0858 04/17/23 1636 04/18/23 0419 04/19/23 0408  AST 23 26 38 216* 614*  ALT 18 17 22  119* 331*  ALKPHOS 98 102 90 101 117  BILITOT 1.0 0.5 0.8 0.8 0.6  PROT 5.5* 6.2* 6.2* 5.4* 5.3*  ALBUMIN 2.9* 3.0* 3.5 3.0* 2.9*   CBC: Recent Labs  Lab 04/13/23 0419 04/14/23 0547 04/15/23 0858 04/17/23 1007 04/17/23 1213 04/17/23 1636 04/18/23 0419  WBC 2.4* 2.1* 2.8*  --    --  3.5* 5.2  NEUTROABS  --  1.2* 1.7  --   --   --  3.8  HGB 8.5* 7.6* 8.7* 10.5* 9.9* 8.8* 8.2*  HCT 29.3* 25.8* 29.8* 31.0* 29.0* 30.8* 28.3*  MCV 69.1* 67.7* 67.6*  --   --  68.3* 67.9*  PLT 87* 76* 99*  --   --  111* 106*   CBG: Recent Labs  Lab 04/18/23 1129 04/18/23 1636 04/18/23 1656 04/18/23 2048 04/19/23 0736  GLUCAP 308* 236* 210* 242* 223*   Hgb A1c No results for input(s): "HGBA1C" in the last 72 hours. Lipid Profile No results for input(s): "CHOL", "HDL", "LDLCALC", "TRIG", "CHOLHDL", "LDLDIRECT" in the last 72 hours. Thyroid function studies No results for input(s): "TSH", "T4TOTAL", "T3FREE", "THYROIDAB" in the last 72 hours.  Invalid input(s): "FREET3" Urinalysis    Component Value Date/Time   COLORURINE YELLOW 04/12/2023 1440   APPEARANCEUR CLEAR 04/12/2023 1440   LABSPEC 1.042 (H) 04/12/2023 1440   PHURINE 6.0 04/12/2023 1440   GLUCOSEU >=500 (A) 04/12/2023 1440   HGBUR SMALL (A) 04/12/2023 1440   BILIRUBINUR NEGATIVE 04/12/2023 1440   KETONESUR 20 (A) 04/12/2023 1440   PROTEINUR 30 (A) 04/12/2023 1440   NITRITE NEGATIVE 04/12/2023 1440   LEUKOCYTESUR NEGATIVE 04/12/2023 1440    FURTHER DISCHARGE INSTRUCTIONS:   Get Medicines reviewed and adjusted: Please take all your medications with you for your next visit with your Primary MD   Laboratory/radiological data: Please request your Primary MD to go over all hospital tests and procedure/radiological results at the follow up, please ask your Primary MD to get all Hospital records sent to his/her office.   In some cases, they will be blood work, cultures and biopsy results pending at the time of your discharge. Please request that your primary care M.D. goes through all the records of your hospital data and follows up on these results.   Also Note the following: If you experience worsening of your admission symptoms, develop shortness of breath, life threatening emergency, suicidal or homicidal  thoughts you must seek medical attention immediately by calling 911 or calling your MD immediately  if symptoms less severe.   You must read complete instructions/literature along with all the possible adverse reactions/side effects for all the Medicines you take and that have been prescribed to you. Take any new Medicines after you have completely understood and accpet all the possible adverse reactions/side effects.    Do not drive when taking Pain medications or sleeping medications (Benzodaizepines)   Do not take more than prescribed Pain, Sleep and Anxiety Medications. It is not advisable to combine anxiety,sleep and pain medications without talking with your primary care practitioner   Special Instructions: If you have smoked or chewed Tobacco  in the last 2 yrs please stop smoking, stop any regular Alcohol  and or any Recreational drug use.   Wear Seat belts while driving.   Please note: You were cared for by a hospitalist during your hospital stay. Once you are discharged, your primary care physician will handle any further medical issues. Please note that NO REFILLS for any discharge medications will be authorized once you are discharged, as it is imperative that you return to your primary care physician (or establish a relationship with a primary care physician if you do not have one) for your post hospital discharge needs so that they can reassess your need for medications and monitor your lab values.  Time coordinating discharge: 35 minutes  SIGNED:  Pamella Pert, MD, PhD 04/19/2023, 9:07 AM

## 2023-04-19 NOTE — TOC Transition Note (Addendum)
Transition of Care [facility]) - CM/SW Discharge Note   Patient Details  Name: Connor Vaughn MRN: [id number] Date of Birth: 11-05-1960  [facility]) CM/SW Contact:  Tom-Johnson, Hershal Coria, RN Phone Number: 04/19/2023, 10:18 AM   Clinical Narrative:     Patient is scheduled for discharge today.  Readmission Risk Assessment done. Hospital f/u and discharge instructions on AVS. PACE to write prescriptions needed and dispense to patient.  No TOC needs or recommendations noted. Rolan Lipa to transport at discharge.  No further TOC needs noted.                  Final next level of care: Home/Self Care Barriers to Discharge: Barriers Resolved   Patient Goals and CMS Choice CMS Medicare.gov Compare Post Acute Care list provided to:: Patient Choice offered to / list presented to : Patient  Discharge Placement                  Patient to be transferred to facility by: Friend Name of family member notified: Margaretmary Dys    Discharge Plan and Services Additional resources added to the After Visit Summary for                  DME Arranged: N/A DME Agency: NA       HH Arranged: NA HH Agency: NA        Social Determinants of Health (SDOH) Interventions SDOH Screenings   Food Insecurity: No Food Insecurity (04/13/2023)  Housing: Patient Declined (04/13/2023)  Transportation Needs: No Transportation Needs (04/13/2023)  Utilities: Not At Risk (04/13/2023)  Tobacco Use: Medium Risk (04/17/2023)     Readmission Risk Interventions    04/19/2023   10:14 AM  Readmission Risk Prevention Plan  Transportation Screening Complete  PCP or Specialist Appt within 5-7 Days Complete  Home Care Screening Complete  Medication Review (RN CM) Referral to Pharmacy

## 2023-04-21 LAB — FLOW CYTOMETRY REQUEST - FLUID (INPATIENT)
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
## 2023-05-09 ENCOUNTER — Ambulatory Visit: Payer: Medicaid Other | Attending: Cardiology | Admitting: Cardiology

## 2023-05-09 VITALS — BP 90/54 | HR 96 | Ht 66.0 in | Wt 151.0 lb

## 2023-05-09 DIAGNOSIS — I10 Essential (primary) hypertension: Secondary | ICD-10-CM

## 2023-05-09 DIAGNOSIS — I2510 Atherosclerotic heart disease of native coronary artery without angina pectoris: Secondary | ICD-10-CM

## 2023-05-09 DIAGNOSIS — R55 Syncope and collapse: Secondary | ICD-10-CM

## 2023-05-09 DIAGNOSIS — I350 Nonrheumatic aortic (valve) stenosis: Secondary | ICD-10-CM | POA: Diagnosis not present

## 2023-05-09 DIAGNOSIS — I422 Other hypertrophic cardiomyopathy: Secondary | ICD-10-CM | POA: Diagnosis not present

## 2023-05-09 MED ORDER — BLOOD PRESSURE MONITOR AUTOMAT DEVI: 1.0000 [IU] | Freq: Once | 0 refills | Status: AC

## 2023-05-09 MED ORDER — CARVEDILOL 6.25 MG PO TABS: 6.2500 mg | ORAL_TABLET | Freq: Two times a day (BID) | ORAL | 3 refills | Status: DC

## 2023-05-09 NOTE — Patient Instructions (Signed)
Medication Instructions:  Start coreg 6.125 twice a day Stop Coreg 12.5 *If you need a refill on your cardiac medications before your next appointment, please call your pharmacy*   Lab Work: Mg, CMET today If you have labs (blood work) drawn today and your tests are completely normal, you will receive your results only by: MyChart Message (if you have MyChart) OR A paper copy in the mail If you have any lab test that is abnormal or we need to change your treatment, we will call you to review the results.   Testing/Procedures: Echo in 6 month   Follow-Up: At [facility], you and your health needs are our priority.  As part of our continuing mission to provide you with exceptional heart care, we have created designated Provider Care Teams.  These Care Teams include your primary Cardiologist (physician) and Advanced Practice Providers (APPs -  Physician Assistants and Nurse Practitioners) who all work together to provide you with the care you need, when you need it.  We recommend signing up for the patient portal called "MyChart".  Sign up information is provided on this After Visit Summary.  MyChart is used to connect with patients for Virtual Visits (Telemedicine).  Patients are able to view lab/test results, encounter notes, upcoming appointments, etc.  Non-urgent messages can be sent to your provider as well.   To learn more about what you can do with MyChart, go to [email or web address].    Your next appointment:   Please schedule after ECHO  Provider:   Little Ishikawa, MD     Other Instructions Please pick up Blood Pressure monitor at  [address] [facility] in [place] Please check blood pressure twice a day for next week and send via my chart or call office

## 2023-05-10 LAB — COMPREHENSIVE METABOLIC PANEL
ALT: 43 [IU]/L (ref 0–44)
AST: 56 [IU]/L — ABNORMAL HIGH (ref 0–40)
Albumin: 3.5 g/dL — ABNORMAL LOW (ref 3.8–4.9)
Alkaline Phosphatase: 260 [IU]/L — ABNORMAL HIGH (ref 44–121)
BUN/Creatinine Ratio: 18 (ref 10–24)
BUN: 12 mg/dL (ref 8–27)
Bilirubin Total: 1.2 mg/dL (ref 0.0–1.2)
CO2: 19 mmol/L — ABNORMAL LOW (ref 20–29)
Calcium: 9.3 mg/dL (ref 8.6–10.2)
Chloride: 102 mmol/L (ref 96–106)
Creatinine, Ser: 0.68 mg/dL — ABNORMAL LOW (ref 0.76–1.27)
Globulin, Total: 2.8 g/dL (ref 1.5–4.5)
Glucose: 268 mg/dL — ABNORMAL HIGH (ref 70–99)
Potassium: 4.6 mmol/L (ref 3.5–5.2)
Sodium: 137 mmol/L (ref 134–144)
Total Protein: 6.3 g/dL (ref 6.0–8.5)
eGFR: 106 mL/min/{1.73_m2} (ref >59)

## 2023-05-10 LAB — MAGNESIUM: Magnesium: 2 mg/dL (ref 1.6–2.3)

## 2023-05-15 ENCOUNTER — Telehealth: Payer: Self-pay | Admitting: *Deleted

## 2023-05-19 ENCOUNTER — Ambulatory Visit
Admission: RE | Admit: 2023-05-19 | Discharge: 2023-05-19 | Disposition: A | Discharge status: Home / Self Care | Payer: Medicaid Other | Source: Ambulatory Visit | Attending: Radiology | Admitting: Radiology

## 2023-05-19 DIAGNOSIS — Z95828 Presence of other vascular implants and grafts: Secondary | ICD-10-CM

## 2023-05-19 HISTORY — PX: IR RADIOLOGIST EVAL & MGMT: IMG5224

## 2023-05-19 NOTE — Progress Notes (Incomplete)
Referring Physician(s): Turpin,Pamela  Supervising Physician: {Supervising Physician:[id number]}  Chief Complaint: The patient is seen in follow up today s/p ***  History of present illness:  ***  Past Medical History:  Diagnosis Date   Alcohol abuse 12/24/2015   Cirrhosis (HCC)    Diabetes mellitus, type II (HCC)    DKA (diabetic ketoacidoses) 04/04/2019   Esophageal varices with bleeding (HCC) 04/12/2023   H/O fracture    nasal x 2   NSVT (nonsustained ventricular tachycardia) (HCC) 04/22/2020   Sleep apnea    UGIB (upper gastrointestinal bleed) 12/2019    Past Surgical History:  Procedure Laterality Date   ESOPHAGEAL BANDING  01/14/2020   Procedure: ESOPHAGEAL VARICES BANDING;  Surgeon: Jeani Hawking, MD;  Location: [facility];  Service: Endoscopy;;   ESOPHAGEAL BANDING  01/29/2023   Procedure: ESOPHAGEAL BANDING;  Surgeon: Kerin Salen, MD;  Location: [facility];  Service: Gastroenterology;;   ESOPHAGOGASTRODUODENOSCOPY N/A 01/13/2020   Procedure: ESOPHAGOGASTRODUODENOSCOPY (EGD);  Surgeon: Charna Elizabeth, MD;  Location: [facility];  Service: Endoscopy;  Laterality: N/A;   ESOPHAGOGASTRODUODENOSCOPY (EGD) WITH PROPOFOL N/A 04/05/2019   Procedure: ESOPHAGOGASTRODUODENOSCOPY (EGD) WITH PROPOFOL;  Surgeon: Jeani Hawking, MD;  Location: [facility];  Service: Endoscopy;  Laterality: N/A;   ESOPHAGOGASTRODUODENOSCOPY (EGD) WITH PROPOFOL N/A 01/14/2020   Procedure: ESOPHAGOGASTRODUODENOSCOPY (EGD) WITH PROPOFOL;  Surgeon: Jeani Hawking, MD;  Location: [facility];  Service: Endoscopy;  Laterality: N/A;   ESOPHAGOGASTRODUODENOSCOPY (EGD) WITH PROPOFOL N/A 01/29/2023   Procedure: ESOPHAGOGASTRODUODENOSCOPY (EGD) WITH PROPOFOL;  Surgeon: Kerin Salen, MD;  Location: [facility];  Service: Gastroenterology;  Laterality: N/A;   HERNIA REPAIR     Age 62   ICD IMPLANT N/A 05/18/2020   Procedure: ICD IMPLANT;  Surgeon: Marinus Maw, MD;  Location: [facility] INVASIVE CV LAB;  Service:  Cardiovascular;  Laterality: N/A;   IR EMBO VENOUS NOT HEMORR HEMANG  INC GUIDE ROADMAPPING  04/17/2023   IR PARACENTESIS  04/14/2023   IR PARACENTESIS  04/17/2023   IR RADIOLOGIST EVAL & MGMT  03/31/2023   IR RADIOLOGIST EVAL & MGMT  05/19/2023   IR THORACENTESIS ASP PLEURAL SPACE W/IMG GUIDE  04/13/2023   IR THORACENTESIS ASP PLEURAL SPACE W/IMG GUIDE  04/17/2023   IR TIPS  04/17/2023   IR US GUIDE VASC ACCESS RIGHT  04/17/2023   IR US GUIDE VASC ACCESS RIGHT  04/17/2023   LUMBAR LAMINECTOMY/DECOMPRESSION MICRODISCECTOMY N/A 12/15/2019   Procedure: LUMBAR THREE-SACRAL ONE LAMINECTOMY WITH LUMBAR FOUR-FIVE DISCECTOMY ;  Surgeon: Bethann Goo, DO;  Location: [facility] OR;  Service: Neurosurgery;  Laterality: N/A;   thoracocenteis     TIPS PROCEDURE N/A 04/17/2023   Procedure: TRANS-JUGULAR INTRAHEPATIC PORTAL SHUNT (TIPS);  Surgeon: Roanna Banning, MD;  Location: [facility] OR;  Service: Radiology;  Laterality: N/A;    Allergies: Latex  Medications: Prior to Admission medications   Medication Sig Start Date End Date Taking? Authorizing Provider  albuterol (VENTOLIN HFA) 108 (90 Base) MCG/ACT inhaler Inhale into the lungs every 6 (six) hours as needed for wheezing or shortness of breath.    [provider]  busPIRone (BUSPAR) 5 MG tablet Take 5 mg by mouth 2 (two) times daily.    [provider]  carboxymethylcellulose (REFRESH PLUS) 0.5 % SOLN Place 1 drop into both eyes 2 (two) times daily as needed (dry eyes).    [provider]  carvedilol (COREG) 6.25 MG tablet Take 1 tablet (6.25 mg total) by mouth 2 (two) times daily. 05/09/23   Little Ishikawa, MD  dapagliflozin propanediol (  FARXIGA) 10 MG TABS tablet Take 10 mg by mouth daily.    [provider]  DULoxetine (CYMBALTA) 30 MG capsule Take 30 mg by mouth daily.    [provider]  DULoxetine (CYMBALTA) 60 MG capsule Take 60 mg by mouth daily.    [provider]  ferrous sulfate 325 (65  FE) MG EC tablet Take 325 mg by mouth daily with breakfast.    [provider]  fexofenadine (ALLEGRA) 60 MG tablet Take 60 mg by mouth daily as needed for allergies.    [provider]  fluticasone (FLONASE) 50 MCG/ACT nasal spray Place 1 spray into both nostrils daily as needed for allergies or rhinitis.    [provider]  furosemide (LASIX) 20 MG tablet Take 1 tablet (20 mg total) by mouth daily. 04/20/23   Leatha Gilding, MD  hydrOXYzine (VISTARIL) 25 MG capsule Take 25 mg by mouth 3 (three) times daily as needed for anxiety.    [provider]  lactulose (CHRONULAC) 10 GM/15ML solution Take 30 mLs (20 g total) by mouth 2 (two) times daily. 04/19/23 05/19/23  Leatha Gilding, MD  lidocaine 4 % Place 1 patch onto the skin 2 (two) times daily as needed (pain).    [provider]  montelukast (SINGULAIR) 10 MG tablet Take 10 mg by mouth every evening.    [provider]  pantoprazole (PROTONIX) 40 MG tablet Take 40 mg by mouth daily.    [provider]  pregabalin (LYRICA) 150 MG capsule Take 150 mg by mouth 2 (two) times daily.    [provider]  sitaGLIPtin (JANUVIA) 50 MG tablet Take 50 mg by mouth daily.    [provider]  spironolactone (ALDACTONE) 50 MG tablet Take 1 tablet (50 mg total) by mouth daily. 04/20/23   Leatha Gilding, MD     Family History  Problem Relation Age of Onset   Diabetes Mother    Heart failure Mother    Valvular heart disease Mother    Diabetes Mellitus II Father    Heart failure Father    Dementia Father    Seizures Neg Hx     Social History   Socioeconomic History   Marital status: Married    Spouse name: Not on file   Number of children: 2   Years of education: Not on file   Highest education level: High school graduate  Occupational History   Not on file  Tobacco Use   Smoking status: Former    Current packs/day: 0.00    Types: Cigarettes    Quit date:  2010    Years since quitting: 14.9   Smokeless tobacco: Never  Vaping Use   Vaping status: Never Used  Substance and Sexual Activity   Alcohol use: Yes    Comment: occ   Drug use: No   Sexual activity: Yes  Other Topics Concern   Not on file  Social History Narrative   Lives at home with his wife   Right handed   No caffeine   Social Drivers of Corporate investment banker Strain: Not on file  Food Insecurity: No Food Insecurity (04/13/2023)   Hunger Vital Sign    Worried About Running Out of Food in the Last Year: Never true    Ran Out of Food in the Last Year: Never true  Transportation Needs: No Transportation Needs (04/13/2023)   PRAPARE - Administrator, Civil Service (Medical): No  Lack of Transportation (Non-Medical): No  Physical Activity: Not on file  Stress: Not on file  Social Connections: Not on file     Vital Signs: BP 125/75   Pulse (!) 109   Temp 97.6 F (36.4 C)   Resp 20   SpO2 93%   Physical Exam  General: WN, NAD  CV: RRR on monitor Pulm: normal work of breathing on RA Abd: S, ND, NT MSK: Grossly normal Psych: Appropriate affect.   Imaging:      Labs:  CBC: Recent Labs    04/14/23 0547 04/15/23 0858 04/17/23 1007 04/17/23 1213 04/17/23 1636 04/18/23 0419  WBC 2.1* 2.8*  --   --  3.5* 5.2  HGB 7.6* 8.7* 10.5* 9.9* 8.8* 8.2*  HCT 25.8* 29.8* 31.0* 29.0* 30.8* 28.3*  PLT 76* 99*  --   --  111* 106*    COAGS: Recent Labs    01/26/23 1001 04/12/23 1220 04/13/23 0419 04/17/23 0356  INR 1.1 1.0 1.1 1.1    BMP: Recent Labs    04/15/23 0858 04/17/23 1007 04/17/23 1636 04/18/23 0419 04/19/23 0408 05/09/23 1259  NA 132*   < > 136 137 135 137  K 4.1   < > 4.0 4.3 3.7 4.6  CL 99  --  103 104 102 102  CO2 27  --  24 23 27  19*  GLUCOSE 236*  --  204* 278* 293* 268*  BUN 14  --  11 12 9 12   CALCIUM 8.8*  --  8.1* 8.5* 8.1* 9.3  CREATININE 0.84  --  0.87 1.09 0.83 0.68*  GFRNONAA >60  --  >60 >60 >60   --    < > = values in this interval not displayed.    LIVER FUNCTION TESTS: Recent Labs    04/17/23 1636 04/18/23 0419 04/19/23 0408 05/09/23 1259  BILITOT 0.8 0.8 0.6 1.2  AST 38 216* 614* 56*  ALT 22 119* 331* 43  ALKPHOS 90 101 117 260*  PROT 6.2* 5.4* 5.3* 6.3  ALBUMIN 3.5 3.0* 2.9* 3.5*    Assessment and Plan:  ***  Electronically Signed: Roanna Banning 05/19/2023, 12:40 PM   I spent a total of {Established Out-Pt:[id number]} in face to face in clinical consultation, greater than 50% of which was counseling/coordinating care for ***

## 2023-05-22 ENCOUNTER — Ambulatory Visit (INDEPENDENT_AMBULATORY_CARE_PROVIDER_SITE_OTHER): Payer: Medicaid Other

## 2023-05-22 DIAGNOSIS — I422 Other hypertrophic cardiomyopathy: Secondary | ICD-10-CM

## 2023-05-22 DIAGNOSIS — I4729 Other ventricular tachycardia: Secondary | ICD-10-CM | POA: Diagnosis not present

## 2023-05-22 LAB — CUP PACEART REMOTE DEVICE CHECK
Battery Voltage: 3.1 V
Brady Statistic RV Percent Paced: 0 %
Date Time Interrogation Session: 20241230100103
HighPow Impedance: 58 Ohm
Implantable Lead Connection Status: 753985
Implantable Lead Implant Date: 20211227
Implantable Lead Location: 753860
Implantable Lead Model: 436909
Implantable Lead Serial Number: 81421424
Implantable Pulse Generator Implant Date: 20211227
Lead Channel Impedance Value: 443 Ohm
Lead Channel Sensing Intrinsic Amplitude: 5.1 mV
Lead Channel Sensing Intrinsic Amplitude: 7.6 mV
Lead Channel Setting Pacing Amplitude: 2.5 V
Lead Channel Setting Pacing Pulse Width: 0.4 ms
Lead Channel Setting Sensing Sensitivity: 0.8 mV
Pulse Gen Model: 429522
Pulse Gen Serial Number: 84812005
Zone Setting Status: 755011

## 2023-05-30 LAB — ACID FAST CULTURE WITH REFLEXED SENSITIVITIES (MYCOBACTERIA): Acid Fast Culture: NEGATIVE

## 2023-05-30 NOTE — Telephone Encounter (Signed)
 Spoke to patient and lab work given.

## 2023-08-21 ENCOUNTER — Other Ambulatory Visit: Payer: Self-pay

## 2023-08-21 ENCOUNTER — Inpatient Hospital Stay (HOSPITAL_COMMUNITY)
Admission: EM | Admit: 2023-08-21 | Discharge: 2023-08-26 | DRG: 432 | Disposition: A | Discharge status: Home / Self Care | Attending: Internal Medicine | Admitting: Internal Medicine

## 2023-08-21 ENCOUNTER — Encounter (HOSPITAL_COMMUNITY): Payer: Self-pay

## 2023-08-21 ENCOUNTER — Ambulatory Visit (INDEPENDENT_AMBULATORY_CARE_PROVIDER_SITE_OTHER): Payer: Medicaid Other

## 2023-08-21 ENCOUNTER — Emergency Department (HOSPITAL_COMMUNITY)

## 2023-08-21 DIAGNOSIS — Q2381 Bicuspid aortic valve: Secondary | ICD-10-CM

## 2023-08-21 DIAGNOSIS — I129 Hypertensive chronic kidney disease with stage 1 through stage 4 chronic kidney disease, or unspecified chronic kidney disease: Secondary | ICD-10-CM | POA: Diagnosis present

## 2023-08-21 DIAGNOSIS — G928 Other toxic encephalopathy: Secondary | ICD-10-CM | POA: Diagnosis present

## 2023-08-21 DIAGNOSIS — E861 Hypovolemia: Secondary | ICD-10-CM | POA: Diagnosis present

## 2023-08-21 DIAGNOSIS — E1122 Type 2 diabetes mellitus with diabetic chronic kidney disease: Secondary | ICD-10-CM | POA: Diagnosis present

## 2023-08-21 DIAGNOSIS — K7290 Hepatic failure, unspecified without coma: Secondary | ICD-10-CM | POA: Insufficient documentation

## 2023-08-21 DIAGNOSIS — Z91148 Patient's other noncompliance with medication regimen for other reason: Secondary | ICD-10-CM

## 2023-08-21 DIAGNOSIS — J918 Pleural effusion in other conditions classified elsewhere: Secondary | ICD-10-CM | POA: Diagnosis present

## 2023-08-21 DIAGNOSIS — E8729 Other acidosis: Secondary | ICD-10-CM | POA: Diagnosis present

## 2023-08-21 DIAGNOSIS — G4730 Sleep apnea, unspecified: Secondary | ICD-10-CM | POA: Diagnosis present

## 2023-08-21 DIAGNOSIS — D638 Anemia in other chronic diseases classified elsewhere: Secondary | ICD-10-CM | POA: Diagnosis present

## 2023-08-21 DIAGNOSIS — Z66 Do not resuscitate: Secondary | ICD-10-CM | POA: Diagnosis present

## 2023-08-21 DIAGNOSIS — I422 Other hypertrophic cardiomyopathy: Secondary | ICD-10-CM | POA: Diagnosis present

## 2023-08-21 DIAGNOSIS — Z7984 Long term (current) use of oral hypoglycemic drugs: Secondary | ICD-10-CM

## 2023-08-21 DIAGNOSIS — K7682 Hepatic encephalopathy: Secondary | ICD-10-CM | POA: Diagnosis not present

## 2023-08-21 DIAGNOSIS — E440 Moderate protein-calorie malnutrition: Secondary | ICD-10-CM | POA: Diagnosis present

## 2023-08-21 DIAGNOSIS — Z833 Family history of diabetes mellitus: Secondary | ICD-10-CM

## 2023-08-21 DIAGNOSIS — Z95810 Presence of automatic (implantable) cardiac defibrillator: Secondary | ICD-10-CM

## 2023-08-21 DIAGNOSIS — K7030 Alcoholic cirrhosis of liver without ascites: Principal | ICD-10-CM | POA: Diagnosis present

## 2023-08-21 DIAGNOSIS — Z87891 Personal history of nicotine dependence: Secondary | ICD-10-CM

## 2023-08-21 DIAGNOSIS — E1110 Type 2 diabetes mellitus with ketoacidosis without coma: Secondary | ICD-10-CM | POA: Diagnosis present

## 2023-08-21 DIAGNOSIS — Z91040 Latex allergy status: Secondary | ICD-10-CM

## 2023-08-21 DIAGNOSIS — F1011 Alcohol abuse, in remission: Secondary | ICD-10-CM

## 2023-08-21 DIAGNOSIS — E872 Acidosis, unspecified: Secondary | ICD-10-CM | POA: Diagnosis present

## 2023-08-21 DIAGNOSIS — E876 Hypokalemia: Secondary | ICD-10-CM | POA: Diagnosis present

## 2023-08-21 DIAGNOSIS — R7989 Other specified abnormal findings of blood chemistry: Secondary | ICD-10-CM

## 2023-08-21 DIAGNOSIS — Z6824 Body mass index (BMI) 24.0-24.9, adult: Secondary | ICD-10-CM

## 2023-08-21 DIAGNOSIS — F1010 Alcohol abuse, uncomplicated: Secondary | ICD-10-CM | POA: Diagnosis present

## 2023-08-21 DIAGNOSIS — Z79899 Other long term (current) drug therapy: Secondary | ICD-10-CM

## 2023-08-21 DIAGNOSIS — D696 Thrombocytopenia, unspecified: Secondary | ICD-10-CM | POA: Diagnosis present

## 2023-08-21 DIAGNOSIS — Z8249 Family history of ischemic heart disease and other diseases of the circulatory system: Secondary | ICD-10-CM

## 2023-08-21 DIAGNOSIS — F39 Unspecified mood [affective] disorder: Secondary | ICD-10-CM | POA: Diagnosis present

## 2023-08-21 DIAGNOSIS — F1990 Other psychoactive substance use, unspecified, uncomplicated: Secondary | ICD-10-CM | POA: Insufficient documentation

## 2023-08-21 DIAGNOSIS — E860 Dehydration: Secondary | ICD-10-CM | POA: Diagnosis present

## 2023-08-21 DIAGNOSIS — F1410 Cocaine abuse, uncomplicated: Secondary | ICD-10-CM | POA: Diagnosis present

## 2023-08-21 DIAGNOSIS — D689 Coagulation defect, unspecified: Secondary | ICD-10-CM | POA: Diagnosis present

## 2023-08-21 DIAGNOSIS — J9811 Atelectasis: Secondary | ICD-10-CM | POA: Diagnosis present

## 2023-08-21 DIAGNOSIS — J690 Pneumonitis due to inhalation of food and vomit: Secondary | ICD-10-CM | POA: Diagnosis present

## 2023-08-21 DIAGNOSIS — N1831 Chronic kidney disease, stage 3a: Secondary | ICD-10-CM | POA: Diagnosis present

## 2023-08-21 DIAGNOSIS — I8510 Secondary esophageal varices without bleeding: Secondary | ICD-10-CM | POA: Diagnosis present

## 2023-08-21 LAB — LIPASE, BLOOD: Lipase: 30 U/L (ref 11–51)

## 2023-08-21 LAB — CBC WITH DIFFERENTIAL/PLATELET
Abs Immature Granulocytes: 0.01 10*3/uL (ref 0.00–0.07)
Basophils Absolute: 0.1 10*3/uL (ref 0.0–0.1)
Basophils Relative: 2 %
Eosinophils Absolute: 0.3 10*3/uL (ref 0.0–0.5)
Eosinophils Relative: 6 %
HCT: 38.4 % — ABNORMAL LOW (ref 39.0–52.0)
Hemoglobin: 12.9 g/dL — ABNORMAL LOW (ref 13.0–17.0)
Immature Granulocytes: 0 %
Lymphocytes Relative: 26 %
Lymphs Abs: 1.5 10*3/uL (ref 0.7–4.0)
MCH: 27.1 pg (ref 26.0–34.0)
MCHC: 33.6 g/dL (ref 30.0–36.0)
MCV: 80.7 fL (ref 80.0–100.0)
Monocytes Absolute: 0.7 10*3/uL (ref 0.1–1.0)
Monocytes Relative: 12 %
Neutro Abs: 3.1 10*3/uL (ref 1.7–7.7)
Neutrophils Relative %: 54 %
Platelets: 127 10*3/uL — ABNORMAL LOW (ref 150–400)
RBC: 4.76 MIL/uL (ref 4.22–5.81)
RDW: 16.2 % — ABNORMAL HIGH (ref 11.5–15.5)
WBC: 5.8 10*3/uL (ref 4.0–10.5)
nRBC: 0 % (ref 0.0–0.2)

## 2023-08-21 LAB — COMPREHENSIVE METABOLIC PANEL WITH GFR
ALT: 27 U/L (ref 0–44)
AST: 41 U/L (ref 15–41)
Albumin: 2.4 g/dL — ABNORMAL LOW (ref 3.5–5.0)
Alkaline Phosphatase: 146 U/L — ABNORMAL HIGH (ref 38–126)
Anion gap: 14 (ref 5–15)
BUN: 13 mg/dL (ref 6–20)
CO2: 17 mmol/L — ABNORMAL LOW (ref 22–32)
Calcium: 8.6 mg/dL — ABNORMAL LOW (ref 8.9–10.3)
Chloride: 106 mmol/L (ref 98–111)
Creatinine, Ser: 1.07 mg/dL (ref 0.61–1.24)
GFR, Estimated: >60 mL/min (ref >60)
Glucose, Bld: 187 mg/dL — ABNORMAL HIGH (ref 70–99)
Potassium: 4 mmol/L (ref 3.5–5.1)
Sodium: 137 mmol/L (ref 135–145)
Total Bilirubin: 2.2 mg/dL — ABNORMAL HIGH (ref 0.0–1.2)
Total Protein: 6.1 g/dL — ABNORMAL LOW (ref 6.5–8.1)

## 2023-08-21 LAB — PROTIME-INR
INR: 1.3 — ABNORMAL HIGH (ref 0.8–1.2)
Prothrombin Time: 16.2 s — ABNORMAL HIGH (ref 11.4–15.2)

## 2023-08-21 LAB — AMMONIA: Ammonia: 254 umol/L — ABNORMAL HIGH (ref 9–35)

## 2023-08-21 LAB — URINALYSIS, W/ REFLEX TO CULTURE (INFECTION SUSPECTED)
Bacteria, UA: NONE SEEN
Bilirubin Urine: NEGATIVE
Glucose, UA: >=500 mg/dL — AB
Ketones, ur: 80 mg/dL — AB
Leukocytes,Ua: NEGATIVE
Nitrite: NEGATIVE
Protein, ur: NEGATIVE mg/dL
Specific Gravity, Urine: 1.037 — ABNORMAL HIGH (ref 1.005–1.030)
pH: 6 (ref 5.0–8.0)

## 2023-08-21 LAB — RAPID URINE DRUG SCREEN, HOSP PERFORMED
Amphetamines: NOT DETECTED
Barbiturates: NOT DETECTED
Benzodiazepines: NOT DETECTED
Cocaine: POSITIVE — AB
Opiates: NOT DETECTED
Tetrahydrocannabinol: NOT DETECTED

## 2023-08-21 LAB — I-STAT VENOUS BLOOD GAS, ED
Acid-base deficit: 4 mmol/L — ABNORMAL HIGH (ref 0.0–2.0)
Bicarbonate: 18 mmol/L — ABNORMAL LOW (ref 20.0–28.0)
Calcium, Ion: 1.09 mmol/L — ABNORMAL LOW (ref 1.15–1.40)
HCT: 36 % — ABNORMAL LOW (ref 39.0–52.0)
Hemoglobin: 12.2 g/dL — ABNORMAL LOW (ref 13.0–17.0)
O2 Saturation: 93 %
Potassium: 4 mmol/L (ref 3.5–5.1)
Sodium: 139 mmol/L (ref 135–145)
TCO2: 19 mmol/L — ABNORMAL LOW (ref 22–32)
pCO2, Ven: 25.1 mmHg — ABNORMAL LOW (ref 44–60)
pH, Ven: 7.463 — ABNORMAL HIGH (ref 7.25–7.43)
pO2, Ven: 60 mmHg — ABNORMAL HIGH (ref 32–45)

## 2023-08-21 LAB — OSMOLALITY: Osmolality: 294 mosm/kg (ref 275–295)

## 2023-08-21 LAB — I-STAT CG4 LACTIC ACID, ED: Lactic Acid, Venous: 4.6 mmol/L (ref 0.5–1.9)

## 2023-08-21 LAB — ACETAMINOPHEN LEVEL: Acetaminophen (Tylenol), Serum: <10 ug/mL — ABNORMAL LOW (ref 10–30)

## 2023-08-21 LAB — CBG MONITORING, ED: Glucose-Capillary: 200 mg/dL — ABNORMAL HIGH (ref 70–99)

## 2023-08-21 LAB — SALICYLATE LEVEL: Salicylate Lvl: <7 mg/dL — ABNORMAL LOW (ref 7.0–30.0)

## 2023-08-21 LAB — ETHANOL: Alcohol, Ethyl (B): <10 mg/dL (ref <10)

## 2023-08-21 LAB — TSH: TSH: 9.977 u[IU]/mL — ABNORMAL HIGH (ref 0.350–4.500)

## 2023-08-21 LAB — BETA-HYDROXYBUTYRIC ACID: Beta-Hydroxybutyric Acid: 2.52 mmol/L — ABNORMAL HIGH (ref 0.05–0.27)

## 2023-08-21 MED ORDER — LACTULOSE ENEMA
300.0000 mL | Freq: Once | ORAL | Status: AC
  Administered 2023-08-21: 300 mL via RECTAL
  Filled 2023-08-21: qty 300

## 2023-08-21 MED ORDER — SODIUM CHLORIDE 0.9 % IV BOLUS
1000.0000 mL | Freq: Once | INTRAVENOUS | Status: AC
  Administered 2023-08-21: 1000 mL via INTRAVENOUS

## 2023-08-21 NOTE — ED Notes (Signed)
 Interrogated [employer] ICD, awaiting report

## 2023-08-21 NOTE — ED Triage Notes (Signed)
 Pt arrives [facility] from home with reports of alerted mental status since Saturday. EMS reports pt has hx of DM and cirrhosis. Wife was unable to give more detail on hx or present issue per EMS. Pt unable to verbalize name and only groans.

## 2023-08-21 NOTE — ED Notes (Signed)
 Pt had large stool, brief and pads changed.

## 2023-08-21 NOTE — ED Provider Notes (Signed)
 [place] EMERGENCY DEPARTMENT AT [facility] Provider Note   CSN: [id number] Arrival date & time: 08/21/23  2020    History  Chief Complaint  Patient presents with   Altered Mental Status    Connor Vaughn is a 63 y.o. male here for AMS   From patient's wife Angelique Blonder as patient is altered level 5 caveat applies   History of diabetes, EtOH abuse not currently drinking, cirrhosis, hep C here for altered mental status. Worsening over the last week.  nitially thought he was developing dementia. States he has had intermittent agitation. He has been putting his diaper on sideways, she is subsequently over the last few days had to put his diaper on for him.  Today he refused to wear his brief and was defecating on the floor.  States he is also had polyphasia, had vomiting last week however none over the last 4 to 5 days.  Ate a good breakfast this morning, did not want to eat lunch or dinner.  Has been sleeping essentially the majority of the day.  He initially did not want to be brought to the emergency department however wife states he was "so out of it" she decided to call 911.  States her last few days he has been turning the kitchen burners on with nothing on them.  He is not currently drinking alcohol.  Denies any licit substance use.  She has been trying to give him his home meds however he does not always want to take them.  He is not on any lactulose.  Wife denies any recent falls or injuries   Patient arrives with DNR at bedside.  I confirmed with patient's wife, Angelique Blonder via telephone at [phone]. Patient is DNI, DNR.  She is okay with IV fluids, antibiotics  Patient's daughter Amber at bedside as well.  She has been out of town so unsure what has been going on with patient.  She states normally he is walkie-talkie.    Per daughter patient is supposed to be on lactulose unknown if taking it  HPI     Home Medications Prior to Admission medications   Medication  Sig Start Date End Date Taking? Authorizing Provider  busPIRone (BUSPAR) 5 MG tablet Take 5 mg by mouth 2 (two) times daily.   Yes [provider]  carvedilol (COREG) 6.25 MG tablet Take 1 tablet (6.25 mg total) by mouth 2 (two) times daily. 05/09/23  Yes Little Ishikawa, MD  dapagliflozin propanediol (FARXIGA) 10 MG TABS tablet Take 10 mg by mouth daily.   Yes [provider]  DULoxetine (CYMBALTA) 30 MG capsule Take 30 mg by mouth daily.   Yes [provider]  DULoxetine (CYMBALTA) 60 MG capsule Take 60 mg by mouth daily.   Yes [provider]  ferrous sulfate 325 (65 FE) MG EC tablet Take 325 mg by mouth daily with breakfast.   Yes [provider]  furosemide (LASIX) 20 MG tablet Take 1 tablet (20 mg total) by mouth daily. 04/20/23  Yes Gherghe, Daylene Katayama, MD  montelukast (SINGULAIR) 10 MG tablet Take 10 mg by mouth every evening.   Yes [provider]  sitaGLIPtin (JANUVIA) 50 MG tablet Take 50 mg by mouth daily.   Yes [provider]      Allergies    Latex    Review of Systems   Review of Systems  Unable to perform ROS: Mental status change  All other systems reviewed and are negative.  Physical Exam Updated Vital Signs BP 123/68   Pulse 84   Temp 98 F (36.7 C) (Rectal)   Resp 16   Wt 67.9 kg   SpO2 100%   BMI 24.16 kg/m  Physical Exam Vitals and nursing note reviewed.  Constitutional:      General: He is in acute distress.     Appearance: He is well-developed. He is ill-appearing. He is not diaphoretic.  HENT:     Head: Normocephalic.     Mouth/Throat:     Mouth: Mucous membranes are dry.  Eyes:     Comments: nystagmus  Cardiovascular:     Rate and Rhythm: Normal rate and regular rhythm.     Pulses: Normal pulses.     Heart sounds: Normal heart sounds.  Pulmonary:     Effort: Pulmonary effort is normal. No respiratory distress.     Breath sounds: Normal breath sounds.  Abdominal:      General: Bowel sounds are normal. There is no distension.     Palpations: Abdomen is soft.     Tenderness: There is no abdominal tenderness. There is no guarding.  Musculoskeletal:     Cervical back: Normal range of motion and neck supple.     Comments: No bony tenderness, old bruising dorsum right hand  Skin:    General: Skin is warm and dry.     Coloration: Skin is pale.  Neurological:     Mental Status: He is disoriented.     GCS: GCS eye subscore is 3. GCS verbal subscore is 1. GCS motor subscore is 4.     Comments: Nystagmus bilateral eyes crosses midline     ED Results / Procedures / Treatments   Labs (all labs ordered are listed, but only abnormal results are displayed) Labs Reviewed  CBC WITH DIFFERENTIAL/PLATELET - Abnormal; Notable for the following components:      Result Value   Hemoglobin 12.9 (*)    HCT 38.4 (*)    RDW 16.2 (*)    Platelets 127 (*)    All other components within normal limits  COMPREHENSIVE METABOLIC PANEL WITH GFR - Abnormal; Notable for the following components:   CO2 17 (*)    Glucose, Bld 187 (*)    Calcium 8.6 (*)    Total Protein 6.1 (*)    Albumin 2.4 (*)    Alkaline Phosphatase 146 (*)    Total Bilirubin 2.2 (*)    All other components within normal limits  AMMONIA - Abnormal; Notable for the following components:   Ammonia 254 (*)    All other components within normal limits  BETA-HYDROXYBUTYRIC ACID - Abnormal; Notable for the following components:   Beta-Hydroxybutyric Acid 2.52 (*)    All other components within normal limits  TSH - Abnormal; Notable for the following components:   TSH 9.977 (*)    All other components within normal limits  ACETAMINOPHEN LEVEL - Abnormal; Notable for the following components:   Acetaminophen (Tylenol), Serum <10 (*)    All other components within normal limits  SALICYLATE LEVEL - Abnormal; Notable for the following components:   Salicylate Lvl <7.0 (*)    All other components within normal  limits  PROTIME-INR - Abnormal; Notable for the following components:   Prothrombin Time 16.2 (*)    INR 1.3 (*)    All other components within normal limits  CBG MONITORING, ED - Abnormal; Notable for the following components:   Glucose-Capillary 200 (*)    All other components  within normal limits  I-STAT CG4 LACTIC ACID, ED - Abnormal; Notable for the following components:   Lactic Acid, Venous 4.6 (*)    All other components within normal limits  I-STAT VENOUS BLOOD GAS, ED - Abnormal; Notable for the following components:   pH, Ven 7.463 (*)    pCO2, Ven 25.1 (*)    pO2, Ven 60 (*)    Bicarbonate 18.0 (*)    TCO2 19 (*)    Acid-base deficit 4.0 (*)    Calcium, Ion 1.09 (*)    HCT 36.0 (*)    Hemoglobin 12.2 (*)    All other components within normal limits  CULTURE, BLOOD (ROUTINE X 2)  CULTURE, BLOOD (ROUTINE X 2)  ETHANOL  LIPASE, BLOOD  OSMOLALITY  RAPID URINE DRUG SCREEN, HOSP PERFORMED  URINALYSIS, W/ REFLEX TO CULTURE (INFECTION SUSPECTED)  I-STAT CG4 LACTIC ACID, ED    EKG EKG Interpretation Date/Time:  Monday August 21 2023 20:21:43 EDT Ventricular Rate:  88 PR Interval:  203 QRS Duration:  107 QT Interval:  415 QTC Calculation: 503 R Axis:   -63  Text Interpretation: Sinus rhythm Borderline prolonged PR interval Abnormal R-wave progression, early transition Inferior infarct, old Prolonged QT interval Confirmed by Vonita Moss [id number]) on 08/21/2023 8:38:49 PM  Radiology CT HEAD WO CONTRAST ( ) Result Date: 08/21/2023 CLINICAL DATA:  Delirium EXAM: CT HEAD WITHOUT CONTRAST TECHNIQUE: Contiguous axial images were obtained from the base of the skull through the vertex without intravenous contrast. RADIATION DOSE REDUCTION: This exam was performed according to the departmental dose-optimization program which includes automated exposure control, adjustment of the mA and/or kV according to patient size and/or use of iterative reconstruction technique.  COMPARISON:  12/12/2019 FINDINGS: Brain: Normal anatomic configuration. No abnormal intra or extra-axial mass lesion or fluid collection. No abnormal mass effect or midline shift. No evidence of acute intracranial hemorrhage or infarct. Ventricular size is normal. Cerebellum unremarkable. Vascular: Unremarkable Skull: Intact Sinuses/Orbits: Paranasal sinuses are clear. Orbits are unremarkable. Other: Mastoid air cells and middle ear cavities are clear. IMPRESSION: 1. No acute intracranial abnormality. Electronically Signed   By: Helyn Numbers M.D.   On: 08/21/2023 21:33   DG Chest 2 View Result Date: 08/21/2023 CLINICAL DATA:  Altered mental status EXAM: CHEST - 2 VIEW COMPARISON:  04/17/2023 FINDINGS: Small right pleural effusion. Right basilar airspace opacities. The left lung is clear. No pneumothorax. Stable cardiomediastinal silhouette. Left chest wall ICD. IMPRESSION: Small right pleural effusion and compressive atelectasis. Electronically Signed   By: Minerva Fester M.D.   On: 08/21/2023 20:57    Procedures .Critical Care  Performed by: Linwood Dibbles, PA-C Authorized by: Linwood Dibbles, PA-C   Critical care provider statement:    Critical care time (minutes):  35   Critical care was necessary to treat or prevent imminent or life-threatening deterioration of the following conditions:  Dehydration and CNS failure or compromise   Critical care was time spent personally by me on the following activities:  Development of treatment plan with patient or surrogate, discussions with consultants, evaluation of patient's response to treatment, examination of patient, ordering and review of laboratory studies, ordering and review of radiographic studies, ordering and performing treatments and interventions, pulse oximetry, re-evaluation of patient's condition and review of old charts     Medications Ordered in ED Medications  lactulose (CHRONULAC) enema 200 gm (has no administration in time  range)  sodium chloride 0.9 % bolus 1,000 mL (1,000 mLs Intravenous New Bag/Given 08/21/23 2102)  sodium chloride 0.9 % bolus 1,000 mL (1,000 mLs Intravenous New Bag/Given 08/21/23 2206)   ED Course/ Medical Decision Making/ A&P Clinical Course as of 08/21/23 2243  Mon Aug 21, 2023  2235 Dr. Lazarus Salines with medicine to admit [BH]    Clinical Course User Index [BH] Armen Waring A, PA-C   63 year old here for evaluation of altered mental status.  History of cirrhosis from chronic EtOH use as well.  TIPS.  Altered mental status over the last week, worse for last 24 hours.  On arrival patient obtained an, will open eyes to loud noises would not follow command.  He has no obvious traumatic injury.  His heart and lungs are clear.  Abdomen soft, tender.  Does have nystagmus on exam.  Broad workup started.  Labs and imaging personally viewed and interpreted:  Chest x-ray without acute abnormality CT head without acute abnormality Ammonia 254 Beta hydroxy 2.52 Lactic acid 4.6 VBG pH 4.3 Lipase 30 CBC without leukocytosis Metabolic panel alk phos 146, downtrending from prior, T. bili 2.2  Patient reassessed.  Same mentation.  He is a DNR, DNI.  Discussed plan with daughter in room.  She is agreeable for admission.  Discussed with Dr. Lazarus Salines with medicine who is agreeable to eval patient for admission.  He cannot safely tolerate p.o. intake.  Lactulose enema ordered.  Patient critically ill with hepatic encephalopathy.  Being admitted for further workup and management.  The patient appears reasonably stabilized for admission considering the current resources, flow, and capabilities available in the ED at this time, and I doubt any other [facility] requiring further screening and/or treatment in the ED prior to admission.                                  Medical Decision Making Amount and/or Complexity of Data Reviewed Independent Historian: spouse and EMS    Details: Denies- spouse; daughter  amber External Data Reviewed: labs, radiology, ECG and notes. Labs: ordered. Decision-making details documented in ED Course. Radiology: ordered and independent interpretation performed. Decision-making details documented in ED Course.  Risk Decision regarding hospitalization.          Final Clinical Impression(s) / ED Diagnoses Final diagnoses:  Hepatic encephalopathy (HCC)  Elevated lactic acid level  History of alcohol abuse    Rx / DC Orders ED Discharge Orders     None         Mackensey Bolte A, PA-C 08/21/23 2243    Rondel Baton, MD 08/22/23 [phone]

## 2023-08-21 NOTE — ED Notes (Signed)
 Rep from [employer] reported no events detected on ICD.

## 2023-08-22 ENCOUNTER — Inpatient Hospital Stay (HOSPITAL_COMMUNITY)

## 2023-08-22 DIAGNOSIS — F39 Unspecified mood [affective] disorder: Secondary | ICD-10-CM | POA: Diagnosis present

## 2023-08-22 DIAGNOSIS — E8721 Acute metabolic acidosis: Secondary | ICD-10-CM

## 2023-08-22 DIAGNOSIS — K7682 Hepatic encephalopathy: Secondary | ICD-10-CM | POA: Diagnosis present

## 2023-08-22 DIAGNOSIS — D649 Anemia, unspecified: Secondary | ICD-10-CM | POA: Diagnosis not present

## 2023-08-22 DIAGNOSIS — I8510 Secondary esophageal varices without bleeding: Secondary | ICD-10-CM | POA: Diagnosis present

## 2023-08-22 DIAGNOSIS — F1010 Alcohol abuse, uncomplicated: Secondary | ICD-10-CM | POA: Diagnosis present

## 2023-08-22 DIAGNOSIS — E8729 Other acidosis: Secondary | ICD-10-CM | POA: Diagnosis not present

## 2023-08-22 DIAGNOSIS — K7460 Unspecified cirrhosis of liver: Secondary | ICD-10-CM

## 2023-08-22 DIAGNOSIS — E1110 Type 2 diabetes mellitus with ketoacidosis without coma: Secondary | ICD-10-CM | POA: Diagnosis present

## 2023-08-22 DIAGNOSIS — J918 Pleural effusion in other conditions classified elsewhere: Secondary | ICD-10-CM | POA: Diagnosis present

## 2023-08-22 DIAGNOSIS — D696 Thrombocytopenia, unspecified: Secondary | ICD-10-CM | POA: Diagnosis present

## 2023-08-22 DIAGNOSIS — F1990 Other psychoactive substance use, unspecified, uncomplicated: Secondary | ICD-10-CM | POA: Insufficient documentation

## 2023-08-22 DIAGNOSIS — Q2381 Bicuspid aortic valve: Secondary | ICD-10-CM | POA: Diagnosis not present

## 2023-08-22 DIAGNOSIS — E861 Hypovolemia: Secondary | ICD-10-CM | POA: Diagnosis present

## 2023-08-22 DIAGNOSIS — Z66 Do not resuscitate: Secondary | ICD-10-CM | POA: Diagnosis present

## 2023-08-22 DIAGNOSIS — G4730 Sleep apnea, unspecified: Secondary | ICD-10-CM | POA: Diagnosis present

## 2023-08-22 DIAGNOSIS — E440 Moderate protein-calorie malnutrition: Secondary | ICD-10-CM | POA: Diagnosis present

## 2023-08-22 DIAGNOSIS — K7290 Hepatic failure, unspecified without coma: Secondary | ICD-10-CM

## 2023-08-22 DIAGNOSIS — I422 Other hypertrophic cardiomyopathy: Secondary | ICD-10-CM | POA: Diagnosis present

## 2023-08-22 DIAGNOSIS — F1410 Cocaine abuse, uncomplicated: Secondary | ICD-10-CM | POA: Diagnosis present

## 2023-08-22 DIAGNOSIS — E1122 Type 2 diabetes mellitus with diabetic chronic kidney disease: Secondary | ICD-10-CM | POA: Diagnosis present

## 2023-08-22 DIAGNOSIS — I129 Hypertensive chronic kidney disease with stage 1 through stage 4 chronic kidney disease, or unspecified chronic kidney disease: Secondary | ICD-10-CM | POA: Diagnosis present

## 2023-08-22 DIAGNOSIS — E860 Dehydration: Secondary | ICD-10-CM | POA: Diagnosis present

## 2023-08-22 DIAGNOSIS — F1011 Alcohol abuse, in remission: Secondary | ICD-10-CM

## 2023-08-22 DIAGNOSIS — D638 Anemia in other chronic diseases classified elsewhere: Secondary | ICD-10-CM | POA: Diagnosis present

## 2023-08-22 DIAGNOSIS — R7989 Other specified abnormal findings of blood chemistry: Secondary | ICD-10-CM

## 2023-08-22 DIAGNOSIS — G928 Other toxic encephalopathy: Secondary | ICD-10-CM | POA: Diagnosis present

## 2023-08-22 DIAGNOSIS — K7030 Alcoholic cirrhosis of liver without ascites: Secondary | ICD-10-CM | POA: Diagnosis present

## 2023-08-22 DIAGNOSIS — N1831 Chronic kidney disease, stage 3a: Secondary | ICD-10-CM | POA: Diagnosis present

## 2023-08-22 DIAGNOSIS — J690 Pneumonitis due to inhalation of food and vomit: Secondary | ICD-10-CM | POA: Diagnosis present

## 2023-08-22 DIAGNOSIS — E876 Hypokalemia: Secondary | ICD-10-CM | POA: Diagnosis present

## 2023-08-22 DIAGNOSIS — E872 Acidosis, unspecified: Secondary | ICD-10-CM | POA: Diagnosis not present

## 2023-08-22 DIAGNOSIS — D689 Coagulation defect, unspecified: Secondary | ICD-10-CM | POA: Diagnosis present

## 2023-08-22 LAB — PROTIME-INR
INR: 1.4 — ABNORMAL HIGH (ref 0.8–1.2)
Prothrombin Time: 17.4 s — ABNORMAL HIGH (ref 11.4–15.2)

## 2023-08-22 LAB — HEPATIC FUNCTION PANEL
ALT: 25 U/L (ref 0–44)
AST: 40 U/L (ref 15–41)
Albumin: 2.3 g/dL — ABNORMAL LOW (ref 3.5–5.0)
Alkaline Phosphatase: 129 U/L — ABNORMAL HIGH (ref 38–126)
Bilirubin, Direct: 0.5 mg/dL — ABNORMAL HIGH (ref 0.0–0.2)
Indirect Bilirubin: 1.5 mg/dL — ABNORMAL HIGH (ref 0.3–0.9)
Total Bilirubin: 2 mg/dL — ABNORMAL HIGH (ref 0.0–1.2)
Total Protein: 5.6 g/dL — ABNORMAL LOW (ref 6.5–8.1)

## 2023-08-22 LAB — GLUCOSE, CAPILLARY
Glucose-Capillary: 83 mg/dL (ref 70–99)
Glucose-Capillary: 90 mg/dL (ref 70–99)
Glucose-Capillary: 118 mg/dL — ABNORMAL HIGH (ref 70–99)
Glucose-Capillary: 151 mg/dL — ABNORMAL HIGH (ref 70–99)
Glucose-Capillary: 157 mg/dL — ABNORMAL HIGH (ref 70–99)
Glucose-Capillary: 163 mg/dL — ABNORMAL HIGH (ref 70–99)
Glucose-Capillary: 163 mg/dL — ABNORMAL HIGH (ref 70–99)
Glucose-Capillary: 167 mg/dL — ABNORMAL HIGH (ref 70–99)
Glucose-Capillary: 153 mg/dL — ABNORMAL HIGH (ref 70–99)

## 2023-08-22 LAB — LACTIC ACID, PLASMA
Lactic Acid, Venous: 3.6 mmol/L (ref 0.5–1.9)
Lactic Acid, Venous: 4.2 mmol/L (ref 0.5–1.9)
Lactic Acid, Venous: 3.5 mmol/L (ref 0.5–1.9)

## 2023-08-22 LAB — CBG MONITORING, ED
Glucose-Capillary: 167 mg/dL — ABNORMAL HIGH (ref 70–99)
Glucose-Capillary: 149 mg/dL — ABNORMAL HIGH (ref 70–99)
Glucose-Capillary: 116 mg/dL — ABNORMAL HIGH (ref 70–99)
Glucose-Capillary: 130 mg/dL — ABNORMAL HIGH (ref 70–99)
Glucose-Capillary: 153 mg/dL — ABNORMAL HIGH (ref 70–99)
Glucose-Capillary: 136 mg/dL — ABNORMAL HIGH (ref 70–99)
Glucose-Capillary: 152 mg/dL — ABNORMAL HIGH (ref 70–99)
Glucose-Capillary: 221 mg/dL — ABNORMAL HIGH (ref 70–99)
Glucose-Capillary: 153 mg/dL — ABNORMAL HIGH (ref 70–99)

## 2023-08-22 LAB — CBC
HCT: 36.1 % — ABNORMAL LOW (ref 39.0–52.0)
Hemoglobin: 12.1 g/dL — ABNORMAL LOW (ref 13.0–17.0)
MCH: 27.2 pg (ref 26.0–34.0)
MCHC: 33.5 g/dL (ref 30.0–36.0)
MCV: 81.1 fL (ref 80.0–100.0)
Platelets: 136 10*3/uL — ABNORMAL LOW (ref 150–400)
RBC: 4.45 MIL/uL (ref 4.22–5.81)
RDW: 16.2 % — ABNORMAL HIGH (ref 11.5–15.5)
WBC: 9.1 10*3/uL (ref 4.0–10.5)
nRBC: 0 % (ref 0.0–0.2)

## 2023-08-22 LAB — BASIC METABOLIC PANEL WITH GFR
Anion gap: 16 — ABNORMAL HIGH (ref 5–15)
Anion gap: 14 (ref 5–15)
Anion gap: 12 (ref 5–15)
Anion gap: 10 (ref 5–15)
Anion gap: 8 (ref 5–15)
Anion gap: 12 (ref 5–15)
BUN: 14 mg/dL (ref 6–20)
BUN: 14 mg/dL (ref 6–20)
BUN: 12 mg/dL (ref 6–20)
BUN: 11 mg/dL (ref 6–20)
BUN: 11 mg/dL (ref 6–20)
BUN: 9 mg/dL (ref 6–20)
CO2: 14 mmol/L — ABNORMAL LOW (ref 22–32)
CO2: 16 mmol/L — ABNORMAL LOW (ref 22–32)
CO2: 15 mmol/L — ABNORMAL LOW (ref 22–32)
CO2: 19 mmol/L — ABNORMAL LOW (ref 22–32)
CO2: 20 mmol/L — ABNORMAL LOW (ref 22–32)
CO2: 17 mmol/L — ABNORMAL LOW (ref 22–32)
Calcium: 8.1 mg/dL — ABNORMAL LOW (ref 8.9–10.3)
Calcium: 8.4 mg/dL — ABNORMAL LOW (ref 8.9–10.3)
Calcium: 7.9 mg/dL — ABNORMAL LOW (ref 8.9–10.3)
Calcium: 8.7 mg/dL — ABNORMAL LOW (ref 8.9–10.3)
Calcium: 8.6 mg/dL — ABNORMAL LOW (ref 8.9–10.3)
Calcium: 8.5 mg/dL — ABNORMAL LOW (ref 8.9–10.3)
Chloride: 110 mmol/L (ref 98–111)
Chloride: 111 mmol/L (ref 98–111)
Chloride: 112 mmol/L — ABNORMAL HIGH (ref 98–111)
Chloride: 112 mmol/L — ABNORMAL HIGH (ref 98–111)
Chloride: 113 mmol/L — ABNORMAL HIGH (ref 98–111)
Chloride: 114 mmol/L — ABNORMAL HIGH (ref 98–111)
Creatinine, Ser: 0.99 mg/dL (ref 0.61–1.24)
Creatinine, Ser: 0.93 mg/dL (ref 0.61–1.24)
Creatinine, Ser: 1.02 mg/dL (ref 0.61–1.24)
Creatinine, Ser: 0.96 mg/dL (ref 0.61–1.24)
Creatinine, Ser: 0.92 mg/dL (ref 0.61–1.24)
Creatinine, Ser: 0.87 mg/dL (ref 0.61–1.24)
GFR, Estimated: >60 mL/min (ref >60)
GFR, Estimated: >60 mL/min (ref >60)
GFR, Estimated: >60 mL/min (ref >60)
GFR, Estimated: >60 mL/min (ref >60)
GFR, Estimated: >60 mL/min (ref >60)
GFR, Estimated: >60 mL/min (ref >60)
Glucose, Bld: 155 mg/dL — ABNORMAL HIGH (ref 70–99)
Glucose, Bld: 124 mg/dL — ABNORMAL HIGH (ref 70–99)
Glucose, Bld: 384 mg/dL — ABNORMAL HIGH (ref 70–99)
Glucose, Bld: 115 mg/dL — ABNORMAL HIGH (ref 70–99)
Glucose, Bld: 165 mg/dL — ABNORMAL HIGH (ref 70–99)
Glucose, Bld: 171 mg/dL — ABNORMAL HIGH (ref 70–99)
Potassium: 3.7 mmol/L (ref 3.5–5.1)
Potassium: 3.6 mmol/L (ref 3.5–5.1)
Potassium: 3.7 mmol/L (ref 3.5–5.1)
Potassium: 3.5 mmol/L (ref 3.5–5.1)
Potassium: 3.4 mmol/L — ABNORMAL LOW (ref 3.5–5.1)
Potassium: 3.2 mmol/L — ABNORMAL LOW (ref 3.5–5.1)
Sodium: 140 mmol/L (ref 135–145)
Sodium: 141 mmol/L (ref 135–145)
Sodium: 139 mmol/L (ref 135–145)
Sodium: 141 mmol/L (ref 135–145)
Sodium: 141 mmol/L (ref 135–145)
Sodium: 143 mmol/L (ref 135–145)

## 2023-08-22 LAB — I-STAT CG4 LACTIC ACID, ED: Lactic Acid, Venous: 6.4 mmol/L (ref 0.5–1.9)

## 2023-08-22 LAB — T4, FREE: Free T4: 1.02 ng/dL (ref 0.61–1.12)

## 2023-08-22 LAB — PHOSPHORUS: Phosphorus: 2.7 mg/dL (ref 2.5–4.6)

## 2023-08-22 LAB — BETA-HYDROXYBUTYRIC ACID
Beta-Hydroxybutyric Acid: 1.54 mmol/L — ABNORMAL HIGH (ref 0.05–0.27)
Beta-Hydroxybutyric Acid: 0.52 mmol/L — ABNORMAL HIGH (ref 0.05–0.27)
Beta-Hydroxybutyric Acid: 0.45 mmol/L — ABNORMAL HIGH (ref 0.05–0.27)

## 2023-08-22 LAB — HEMOGLOBIN A1C
Hgb A1c MFr Bld: 10 % — ABNORMAL HIGH (ref 4.8–5.6)
Mean Plasma Glucose: 240.3 mg/dL

## 2023-08-22 LAB — MAGNESIUM: Magnesium: 1.8 mg/dL (ref 1.7–2.4)

## 2023-08-22 LAB — AMMONIA: Ammonia: 52 umol/L — ABNORMAL HIGH (ref 9–35)

## 2023-08-22 MED ORDER — DEXTROSE 10 % IV SOLN: INTRAVENOUS | Status: DC

## 2023-08-22 MED ORDER — POLYETHYLENE GLYCOL 3350 17 G PO PACK: 17.0000 g | PACK | Freq: Every day | ORAL | Status: DC | PRN

## 2023-08-22 MED ORDER — SODIUM CHLORIDE 0.9 % IV SOLN
2.0000 g | INTRAVENOUS | Status: DC
  Administered 2023-08-22 – 2023-08-25 (×4): 2 g via INTRAVENOUS
  Filled 2023-08-22 (×4): qty 20

## 2023-08-22 MED ORDER — POTASSIUM CHLORIDE 20 MEQ PO PACK
60.0000 meq | PACK | ORAL | Status: AC
  Administered 2023-08-22: 60 meq via ORAL
  Filled 2023-08-22: qty 3

## 2023-08-22 MED ORDER — LACTATED RINGERS IV SOLN
INTRAVENOUS | Status: AC
  Administered 2023-08-23: 1000 mL via INTRAVENOUS

## 2023-08-22 MED ORDER — LACTULOSE 10 GM/15ML PO SOLN
30.0000 g | ORAL | Status: DC
  Administered 2023-08-22 – 2023-08-23 (×7): 30 g
  Filled 2023-08-22 (×2): qty 60
  Filled 2023-08-22: qty 45
  Filled 2023-08-22 (×4): qty 60

## 2023-08-22 MED ORDER — INSULIN ASPART 100 UNIT/ML IJ SOLN: 0.0000 [IU] | Freq: Four times a day (QID) | INTRAMUSCULAR | Status: DC

## 2023-08-22 MED ORDER — DEXTROSE IN LACTATED RINGERS 5 % IV SOLN: INTRAVENOUS | Status: DC

## 2023-08-22 MED ORDER — LACTULOSE 10 GM/15ML PO SOLN
30.0000 g | ORAL | Status: DC
Start: 2023-08-23 — End: 2023-08-22

## 2023-08-22 MED ORDER — INSULIN ASPART 100 UNIT/ML IJ SOLN: 0.0000 [IU] | Freq: Every day | INTRAMUSCULAR | Status: DC

## 2023-08-22 MED ORDER — THIAMINE HCL 100 MG/ML IJ SOLN
100.0000 mg | INTRAMUSCULAR | Status: DC
  Administered 2023-08-23: 100 mg via INTRAVENOUS
  Filled 2023-08-22: qty 2

## 2023-08-22 MED ORDER — ACETAMINOPHEN 500 MG PO TABS: 500.0000 mg | ORAL_TABLET | Freq: Four times a day (QID) | ORAL | Status: DC | PRN

## 2023-08-22 MED ORDER — INSULIN ASPART 100 UNIT/ML IJ SOLN
0.0000 [IU] | INTRAMUSCULAR | Status: DC
  Administered 2023-08-22: 1 [IU] via SUBCUTANEOUS
  Administered 2023-08-23: 2 [IU] via SUBCUTANEOUS
  Administered 2023-08-23 (×2): 1 [IU] via SUBCUTANEOUS
  Administered 2023-08-24 (×2): 2 [IU] via SUBCUTANEOUS
  Administered 2023-08-24: 1 [IU] via SUBCUTANEOUS
  Administered 2023-08-24 – 2023-08-25 (×2): 3 [IU] via SUBCUTANEOUS
  Administered 2023-08-25: 2 [IU] via SUBCUTANEOUS
  Administered 2023-08-25: 3 [IU] via SUBCUTANEOUS
  Administered 2023-08-25: 2 [IU] via SUBCUTANEOUS
  Administered 2023-08-25: 4 [IU] via SUBCUTANEOUS
  Administered 2023-08-25 – 2023-08-26 (×3): 2 [IU] via SUBCUTANEOUS

## 2023-08-22 MED ORDER — RIFAXIMIN 550 MG PO TABS
550.0000 mg | ORAL_TABLET | Freq: Two times a day (BID) | ORAL | Status: DC
Start: 2023-08-22 — End: 2023-08-26
  Administered 2023-08-22 – 2023-08-26 (×9): 550 mg via ORAL
  Filled 2023-08-22 (×9): qty 1

## 2023-08-22 MED ORDER — INSULIN GLARGINE-YFGN 100 UNIT/ML ~~LOC~~ SOLN
10.0000 [IU] | Freq: Every day | SUBCUTANEOUS | Status: DC
  Administered 2023-08-22 – 2023-08-25 (×4): 10 [IU] via SUBCUTANEOUS
  Filled 2023-08-22 (×5): qty 0.1

## 2023-08-22 MED ORDER — SODIUM CHLORIDE 0.9% FLUSH
3.0000 mL | Freq: Two times a day (BID) | INTRAVENOUS | Status: DC
  Administered 2023-08-22 – 2023-08-25 (×8): 3 mL via INTRAVENOUS

## 2023-08-22 MED ORDER — METRONIDAZOLE 500 MG/100ML IV SOLN
500.0000 mg | Freq: Two times a day (BID) | INTRAVENOUS | Status: DC
  Administered 2023-08-22 – 2023-08-25 (×9): 500 mg via INTRAVENOUS
  Filled 2023-08-22 (×9): qty 100

## 2023-08-22 MED ORDER — CALCIUM GLUCONATE-NACL 1-0.675 GM/50ML-% IV SOLN
1.0000 g | Freq: Once | INTRAVENOUS | Status: AC
  Administered 2023-08-22: 1000 mg via INTRAVENOUS
  Filled 2023-08-22: qty 50

## 2023-08-22 MED ORDER — POTASSIUM CHLORIDE 20 MEQ PO PACK
40.0000 meq | PACK | Freq: Once | ORAL | Status: AC
  Administered 2023-08-22: 40 meq
  Filled 2023-08-22: qty 2

## 2023-08-22 MED ORDER — ALBUTEROL SULFATE (2.5 MG/3ML) 0.083% IN NEBU: 2.5000 mg | INHALATION_SOLUTION | RESPIRATORY_TRACT | Status: DC | PRN

## 2023-08-22 MED ORDER — SODIUM CHLORIDE 0.9 % IV SOLN
2.0000 g | Freq: Once | INTRAVENOUS | Status: AC
  Administered 2023-08-22: 2 g via INTRAVENOUS
  Filled 2023-08-22: qty 20

## 2023-08-22 MED ORDER — INSULIN REGULAR(HUMAN) IN NACL 100-0.9 UT/100ML-% IV SOLN
INTRAVENOUS | Status: DC
Start: 2023-08-22 — End: 2023-08-22
  Administered 2023-08-22: 4.2 [IU]/h via INTRAVENOUS
  Filled 2023-08-22: qty 100

## 2023-08-22 MED ORDER — LACTULOSE ENEMA
300.0000 mL | ORAL | Status: DC
  Filled 2023-08-22 (×3): qty 300

## 2023-08-22 MED ORDER — LACTATED RINGERS BOLUS PEDS
1000.0000 mL | Freq: Once | INTRAVENOUS | Status: AC
  Administered 2023-08-22: 1000 mL via INTRAVENOUS

## 2023-08-22 MED ORDER — LACTATED RINGERS IV SOLN: INTRAVENOUS | Status: AC

## 2023-08-22 MED ORDER — THIAMINE HCL 100 MG/ML IJ SOLN
500.0000 mg | Freq: Once | INTRAVENOUS | Status: AC
  Administered 2023-08-22: 500 mg via INTRAVENOUS
  Filled 2023-08-22: qty 5

## 2023-08-22 MED ORDER — MELATONIN 3 MG PO TABS: 6.0000 mg | ORAL_TABLET | Freq: Every evening | ORAL | Status: DC | PRN

## 2023-08-22 MED ORDER — DEXTROSE 50 % IV SOLN: 0.0000 mL | INTRAVENOUS | Status: DC | PRN

## 2023-08-22 MED ORDER — POTASSIUM CHLORIDE 10 MEQ/100ML IV SOLN
10.0000 meq | INTRAVENOUS | Status: AC
  Administered 2023-08-22 (×2): 10 meq via INTRAVENOUS
  Filled 2023-08-22 (×2): qty 100

## 2023-08-22 MED ORDER — INSULIN ASPART 100 UNIT/ML IJ SOLN: 0.0000 [IU] | Freq: Three times a day (TID) | INTRAMUSCULAR | Status: DC

## 2023-08-22 NOTE — TOC Initial Note (Signed)
 Transition of Care [facility]) - Initial/Assessment Note    Patient Details  Name: Connor Vaughn MRN: [id number] Date of Birth: [birth year]  Transition of Care [facility]) CM/SW Contact:    Mearl Latin, LCSW Phone Number: 08/22/2023, 1:11 PM  Clinical Narrative:                 CSW spoke with [place] [phone]) at [facility]). She confirmed patient goes to their center once a week for therapy. Will continue to follow for needs.     Barriers to Discharge: Continued Medical Work up   Patient Goals and CMS Choice            Expected Discharge Plan and Services In-house Referral: Clinical Social Work     Living arrangements for the past 2 months: Single Family Home                                      Prior Living Arrangements/Services Living arrangements for the past 2 months: Single Family Home Lives with:: Spouse Patient language and need for interpreter reviewed:: Yes Do you feel safe going back to the place where you live?: Yes      Need for Family Participation in Patient Care: Yes (Comment) Care giver support system in place?: Yes (comment) Current home services: DME Criminal Activity/Legal Involvement Pertinent to Current Situation/Hospitalization: No - Comment as needed  Activities of Daily Living   ADL Screening (condition at time of admission) Independently performs ADLs?: No Does the patient have a NEW difficulty with bathing/dressing/toileting/self-feeding that is expected to last >3 days?: Yes (Initiates electronic notice to provider for possible OT consult) Does the patient have a NEW difficulty with getting in/out of bed, walking, or climbing stairs that is expected to last >3 days?: Yes (Initiates electronic notice to provider for possible PT consult) Does the patient have a NEW difficulty with communication that is expected to last >3 days?: No Is the patient deaf or have difficulty hearing?: No Does the patient have difficulty  seeing, even when wearing glasses/contacts?: No Does the patient have difficulty concentrating, remembering, or making decisions?: Yes  Permission Sought/Granted Permission sought to share information with : Facility Industrial/product designer granted to share information with : No     Permission granted to share info w AGENCY: [facility]        Emotional Assessment Appearance:: Appears stated age Attitude/Demeanor/Rapport: Unable to Assess Affect (typically observed): Unable to Assess   Alcohol / Substance Use: Not Applicable Psych Involvement: No (comment)  Admission diagnosis:  Hepatic encephalopathy (HCC) [K76.82] History of alcohol abuse [F10.11] Elevated lactic acid level [R79.89] Patient Active Problem List   Diagnosis Date Noted   Hepatic encephalopathy (HCC) 08/22/2023   Decompensated cirrhosis (HCC) 08/22/2023   Polysubstance use disorder 08/22/2023   Elevated lactic acid level 08/22/2023   History of alcohol abuse 08/22/2023   Moderate aortic valve stenosis - mean gradient 23.6 mmHg, peak gradient 41.9 mmHg . 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   Portal hypertension (HCC) 01/24/2023   DNR (do not resuscitate)/DNI(Do Not Intubate) 01/24/2023   ICD (implantable cardioverter-defibrillator) in place 03/22/2022   Hypertrophic  cardiomyopathy (HCC) 04/22/2020   CKD stage 3a, GFR 45-59 ml/min (HCC) 01/13/2020   DM (diabetes mellitus), type 2 with complications (HCC) 01/13/2020   Neurogenic claudication due to lumbar spinal stenosis 12/15/2019   Ketoacidosis 04/04/2019   Lactic acidosis 04/04/2019   Recurrent right pleural effusion/hepatic hydrothorax 01/10/2016    Alcoholic cirrhosis of liver with ascites (HCC)    PCP:  Ricky Stabs, NP-C Pharmacy:   [facility] - [place], [place] - [address] [place] [address] Phone: [phone] Fax: [phone]  [facility] - [place], [place] - [address] [place] [address] Phone: [phone] Fax: [phone]  [facility] - [place], [place] - [address] [place] [address] Phone: [phone] Fax: [phone]  [facility] - [place], [place] - [address] [place] [address] Phone: [phone] Fax: [phone]     Social Drivers of Health (SDOH) Social History: SDOH Screenings   Food Insecurity: Food Insecurity Present (08/22/2023)  Housing: Low Risk  (08/22/2023)  Transportation Needs: No Transportation Needs (08/22/2023)  Utilities: At Risk (08/22/2023)  Tobacco Use: Medium Risk (08/21/2023)   SDOH Interventions:     Readmission Risk Interventions    04/19/2023   10:14 AM  Readmission Risk Prevention Plan  Transportation Screening Complete  PCP or Specialist Appt within 5-7 Days Complete  Home Care Screening Complete  Medication Review (RN CM) Referral to Pharmacy

## 2023-08-22 NOTE — Inpatient Diabetes Management (Addendum)
 Inpatient Diabetes Program Recommendations  AACE/ADA: New Consensus Statement on Inpatient Glycemic Control (2015)  Target Ranges:  Prepandial:   less than 140 mg/dL      Peak postprandial:   less than 180 mg/dL (1-2 hours)      Critically ill patients:  140 - 180 mg/dL   Lab Results  Component Value Date   GLUCAP 152 (H) 08/22/2023   HGBA1C 10.0 (H) 08/22/2023    Review of Glycemic Control  Latest Reference Range & Units 08/22/23 03:27 08/22/23 05:27 08/22/23 06:34 08/22/23 07:32 08/22/23 08:46  Glucose-Capillary 70 - 99 mg/dL 161 (H) 096 (H) 045 (H) 136 (H) 152 (H)    Latest Reference Range & Units 08/22/23 08:48  Sodium 135 - 145 mmol/L 139  Potassium 3.5 - 5.1 mmol/L 3.7  Chloride 98 - 111 mmol/L 112 (H)  CO2 22 - 32 mmol/L 15 (L)  Glucose 70 - 99 mg/dL 409 (H)  BUN 6 - 20 mg/dL 12  Creatinine 8.11 - 9.14 mg/dL 7.82  Calcium 8.9 - 95.6 mg/dL 7.9 (L)  Anion gap 5 - 15  12    Latest Reference Range & Units 08/22/23 04:22  Beta-Hydroxybutyric Acid 0.05 - 0.27 mmol/L 1.54 (H)   Diabetes history: DM 2 Outpatient Diabetes medications: Farxiga 10 mg Daily, Januvia 50 mg Daily Current orders for Inpatient glycemic control:  IV insulin Euglycemic DKA  Inpatient Diabetes Program Recommendations:    -   Note betahydroxybutyric acid still elevated, remain on IV insulin until beta returns to normal, Could increase concentration of Dextrose in IV fluids to D10  -   discontinue [place] and have pt follow up with outpt provider who prescribed medication  Will follow up with pt when appropriate  Thanks,  Christena Deem RN, MSN, BC-ADM Inpatient Diabetes Coordinator Team Pager [phone] (8a-5p)

## 2023-08-22 NOTE — Progress Notes (Addendum)
  Lactic acidosis-secondary to advanced hepatic cirrhosis poor clearance and DKA - Lactic acid 3.5. -Patient also has aspiration pneumonia, tachycardic and tachypneic however MAP is above 65.  There is no evidence of hypoperfusion. -Chest x-ray showed small right pleural effusion and complete atelectasis. -Currently getting treated for possible aspiration pneumonia. -Patient has persistent lactic acidosis in the setting of advanced hepatic cirrhosis which is causing poor hepatic lactic acid clearance and at the same time patient has euglycemic DKA which is causing dehydration elevated lactic acid. - Continue to trend lactic acid level, will give 1 L of LR bolus and will start maintenance fluid LR 75 cc/h.   Of note patient has decompensated hepatic cirrhosis will avoid excessive fluid resuscitation.  Tereasa Coop, MD [facility] 08/22/2023, 11:18 PM

## 2023-08-22 NOTE — Consult Note (Signed)
 NAME:  Connor Vaughn, MRN:  [id number], DOB:  1961-01-10, LOS: 0 ADMISSION DATE:  08/21/2023, CONSULTATION DATE:  08/22/23 REFERRING MD:  Dolly Rias  CHIEF COMPLAINT:  AMS   History of Present Illness:  Connor Vaughn is a 63 year old male with alcohol abuse, cirrhosis with esophageal varices s/p TIPS procedure 04/2023 who is brought in to the ER with altered mental status. Patient has continued to drink alcohol post TIPs but per wife has not had any in a few days. He has not had lactulose in a couple of days. No bleeding has been noted.   Patient was given 2L NS in ER. Lactic acid went from 4.6 to 6.4.   Lab review: Serum bicarb 17, glucose 187, Alk phos 146, t. Bili 2.2, albumin 2.4, total protein 6.1, ammonia 254, Hgb 12.9g/dL, plts 409, INR 1.3, Negative acetaminophen and salicylate levels, Beta hydroxybutyrate 2.52, TSH 9.9 with T4 1.02.   Given beta hydroxy butyrate and patient being on farxiga, he is being started on insulin drip for euglycemic DKA.  PCCM consulted given AMS and rising lactic acid level.  Patient is too altered to obtain history. No family at bedside. Information obtained from admitting hospitalist and bedside RN.   Pertinent  Medical History   Past Medical History:  Diagnosis Date   Alcohol abuse 12/24/2015   Cirrhosis (HCC)    Diabetes mellitus, type II (HCC)    DKA (diabetic ketoacidoses) 04/04/2019   Esophageal varices with bleeding (HCC) 04/12/2023   H/O fracture    nasal x 2   NSVT (nonsustained ventricular tachycardia) (HCC) 04/22/2020   Sleep apnea    UGIB (upper gastrointestinal bleed) 12/2019   Significant Hospital Events: Including procedures, antibiotic start and stop dates in addition to other pertinent events   4/1 admitted for AMS, hepatic encephalopathy and lactic acidosis  Interim History / Subjective:  As above  Objective   Blood pressure (!) 147/79, pulse 97, temperature 98 F (36.7 C), temperature source Rectal, resp.  rate 19, weight 67.9 kg, SpO2 100%.        Intake/Output Summary (Last 24 hours) at 08/22/2023 0107 Last data filed at 08/21/2023 2320 Gross per 24 hour  Intake 2000 ml  Output --  Net 2000 ml   Filed Weights   08/21/23 2025  Weight: 67.9 kg    Examination: General: elderly male, chronically ill appearing, no distress HENT: [place]/AT, dry mucous membranes, sclera anicteric Lungs: no wheezing, crackles right base Cardiovascular: rrr, no murmurs Abdomen: soft, non-distended, non-tender Extremities: warm, no edema Neuro: opens eyes to name, moving all 4 extremities spontaneously GU: n/a  CXR: small right pleural effusion, possible RLL opacities CT Head: No acute abnormality  Resolved Hospital Problem list     Assessment & Plan:  Hepatic Encephalopathy Polysubstance Abuse - cocaine positive on admission - given lactulose enema in ER - NG tube placed at bedside for per tube lactulose, check KUB - continue lactulose q4hrs - continue rifaximin  Suspected starvation/alcoholic ketosis vs Euglycemic DKA Anion Gap Metabolic Acidosis Lactic Acidosis - He is on insulin drip protocol per hospitalist team, ok to continue for now - agree with fluid resuscitation with LR with transition to D5LR with blood sugars under 250 - suspect lactic acid rise is in combination to hypovolemia and underlying cirrhosis  Concern for aspiration pneumonia - empiric ceftriaxone and flagyl for aspiration coverage  Liver Cirrhosis Alcohol Abuse Esophageal Varices s/p TIPS 03/2023 - high dose thiamine daily - lactulose as above - monitor  for GI bleeding  Anemia Thrombocytopenia - monitor  Coagulopathy - mildly abnormal INR in setting of cirrhosis - monitor  Low ionized calcium - give 1g calcium gluconate  Best Practice (right click and "Reselect all SmartList Selections" daily)   Diet/type: NPO w/ meds via tube DVT prophylaxis prophylactic heparin  Pressure ulcer(s): N/A GI prophylaxis:  PPI Lines: N/A Foley:  N/A Code Status:  DNR Last date of multidisciplinary goals of care discussion [per primary team/wife]  Labs   CBC: Recent Labs  Lab 08/21/23 2031 08/21/23 2042  WBC 5.8  --   NEUTROABS 3.1  --   HGB 12.9* 12.2*  HCT 38.4* 36.0*  MCV 80.7  --   PLT 127*  --     Basic Metabolic Panel: Recent Labs  Lab 08/21/23 2031 08/21/23 2042  NA 137 139  K 4.0 4.0  CL 106  --   CO2 17*  --   GLUCOSE 187*  --   BUN 13  --   CREATININE 1.07  --   CALCIUM 8.6*  --    GFR: Estimated Creatinine Clearance: 66.3 mL/min (by C-G formula based on SCr of 1.07 mg/dL). Recent Labs  Lab 08/21/23 2031 08/21/23 2042 08/22/23 0017  WBC 5.8  --   --   LATICACIDVEN  --  4.6* 6.4*    Liver Function Tests: Recent Labs  Lab 08/21/23 2031  AST 41  ALT 27  ALKPHOS 146*  BILITOT 2.2*  PROT 6.1*  ALBUMIN 2.4*   Recent Labs  Lab 08/21/23 2031  LIPASE 30   Recent Labs  Lab 08/21/23 2031  AMMONIA 254*    ABG    Component Value Date/Time   PHART 7.393 04/17/2023 1213   PCO2ART 38.9 04/17/2023 1213   PO2ART 152 (H) 04/17/2023 1213   HCO3 18.0 (L) 08/21/2023 2042   TCO2 19 (L) 08/21/2023 2042   ACIDBASEDEF 4.0 (H) 08/21/2023 2042   O2SAT 93 08/21/2023 2042     Coagulation Profile: Recent Labs  Lab 08/21/23 2031  INR 1.3*    Cardiac Enzymes: No results for input(s): "CKTOTAL", "CKMB", "CKMBINDEX", "TROPONINI" in the last 168 hours.  HbA1C: Hgb A1c MFr Bld  Date/Time Value Ref Range Status  01/25/2023 05:12 AM 10.0 (H) 4.8 - 5.6 % Final    Comment:    (NOTE) Pre diabetes:          5.7%-6.4%  Diabetes:              >6.4%  Glycemic control for   <7.0% adults with diabetes   01/13/2020 04:58 PM 6.7 (H) 4.8 - 5.6 % Final    Comment:    (NOTE)         Prediabetes: 5.7 - 6.4         Diabetes: >6.4         Glycemic control for adults with diabetes: <7.0     CBG: Recent Labs  Lab 08/21/23 2023 08/22/23 0056  GLUCAP 200* 167*     Review of Systems:   Unable to perform ROS due to mental status  Past Medical History:  He,  has a past medical history of Alcohol abuse (12/24/2015), Cirrhosis (HCC), Diabetes mellitus, type II (HCC), DKA (diabetic ketoacidoses) (04/04/2019), Esophageal varices with bleeding (HCC) (04/12/2023), H/O fracture, NSVT (nonsustained ventricular tachycardia) (HCC) (04/22/2020), Sleep apnea, and UGIB (upper gastrointestinal bleed) (12/2019).   Surgical History:   Past Surgical History:  Procedure Laterality Date   ESOPHAGEAL BANDING  01/14/2020   Procedure: ESOPHAGEAL VARICES  BANDING;  Surgeon: Jeani Hawking, MD;  Location: [facility];  Service: Endoscopy;;   ESOPHAGEAL BANDING  01/29/2023   Procedure: ESOPHAGEAL BANDING;  Surgeon: Kerin Salen, MD;  Location: [facility];  Service: Gastroenterology;;   ESOPHAGOGASTRODUODENOSCOPY N/A 01/13/2020   Procedure: ESOPHAGOGASTRODUODENOSCOPY (EGD);  Surgeon: Charna Elizabeth, MD;  Location: [facility];  Service: Endoscopy;  Laterality: N/A;   ESOPHAGOGASTRODUODENOSCOPY (EGD) WITH PROPOFOL N/A 04/05/2019   Procedure: ESOPHAGOGASTRODUODENOSCOPY (EGD) WITH PROPOFOL;  Surgeon: Jeani Hawking, MD;  Location: [facility];  Service: Endoscopy;  Laterality: N/A;   ESOPHAGOGASTRODUODENOSCOPY (EGD) WITH PROPOFOL N/A 01/14/2020   Procedure: ESOPHAGOGASTRODUODENOSCOPY (EGD) WITH PROPOFOL;  Surgeon: Jeani Hawking, MD;  Location: [facility];  Service: Endoscopy;  Laterality: N/A;   ESOPHAGOGASTRODUODENOSCOPY (EGD) WITH PROPOFOL N/A 01/29/2023   Procedure: ESOPHAGOGASTRODUODENOSCOPY (EGD) WITH PROPOFOL;  Surgeon: Kerin Salen, MD;  Location: [facility];  Service: Gastroenterology;  Laterality: N/A;   HERNIA REPAIR     Age 68   ICD IMPLANT N/A 05/18/2020   Procedure: ICD IMPLANT;  Surgeon: Marinus Maw, MD;  Location: [facility] INVASIVE CV LAB;  Service: Cardiovascular;  Laterality: N/A;   IR EMBO VENOUS NOT HEMORR HEMANG  INC GUIDE ROADMAPPING  04/17/2023   IR  PARACENTESIS  04/14/2023   IR PARACENTESIS  04/17/2023   IR RADIOLOGIST EVAL & MGMT  03/31/2023   IR RADIOLOGIST EVAL & MGMT  05/19/2023   IR THORACENTESIS ASP PLEURAL SPACE W/IMG GUIDE  04/13/2023   IR THORACENTESIS ASP PLEURAL SPACE W/IMG GUIDE  04/17/2023   IR TIPS  04/17/2023   IR US GUIDE VASC ACCESS RIGHT  04/17/2023   IR US GUIDE VASC ACCESS RIGHT  04/17/2023   LUMBAR LAMINECTOMY/DECOMPRESSION MICRODISCECTOMY N/A 12/15/2019   Procedure: LUMBAR THREE-SACRAL ONE LAMINECTOMY WITH LUMBAR FOUR-FIVE DISCECTOMY ;  Surgeon: Bethann Goo, DO;  Location: [facility] OR;  Service: Neurosurgery;  Laterality: N/A;   thoracocenteis     TIPS PROCEDURE N/A 04/17/2023   Procedure: TRANS-JUGULAR INTRAHEPATIC PORTAL SHUNT (TIPS);  Surgeon: Roanna Banning, MD;  Location: [facility] OR;  Service: Radiology;  Laterality: N/A;     Social History:   reports that he quit smoking about 15 years ago. His smoking use included cigarettes. He has never used smokeless tobacco. He reports current alcohol use. He reports that he does not use drugs.   Family History:  His family history includes Dementia in his father; Diabetes in his mother; Diabetes Mellitus II in his father; Heart failure in his father and mother; Valvular heart disease in his mother. There is no history of Seizures.   Allergies Allergies  Allergen Reactions   Latex Hives and Rash     Home Medications  Prior to Admission medications   Medication Sig Start Date End Date Taking? Authorizing Provider  busPIRone (BUSPAR) 5 MG tablet Take 5 mg by mouth 2 (two) times daily.   Yes [provider]  carvedilol (COREG) 6.25 MG tablet Take 1 tablet (6.25 mg total) by mouth 2 (two) times daily. 05/09/23  Yes Little Ishikawa, MD  dapagliflozin propanediol (FARXIGA) 10 MG TABS tablet Take 10 mg by mouth daily.   Yes [provider]  DULoxetine (CYMBALTA) 30 MG capsule Take 30 mg by mouth daily.   Yes [provider]  DULoxetine  (CYMBALTA) 60 MG capsule Take 60 mg by mouth daily.   Yes [provider]  ferrous sulfate 325 (65 FE) MG EC tablet Take 325 mg by mouth daily with breakfast.   Yes [provider]  furosemide (LASIX) 20  MG tablet Take 1 tablet (20 mg total) by mouth daily. 04/20/23  Yes Gherghe, Daylene Katayama, MD  montelukast (SINGULAIR) 10 MG tablet Take 10 mg by mouth every evening.   Yes [provider]  sitaGLIPtin (JANUVIA) 50 MG tablet Take 50 mg by mouth daily.   Yes [provider]     Critical care time: n/a    Melody Comas, MD [facility] Office: [phone]   See Amion for personal pager PCCM on call pager [phone] until 7pm. Please call Elink 7p-7a. [phone]

## 2023-08-22 NOTE — Progress Notes (Addendum)
 Euglycemic DKA-in setting of alcohol use and SGLT2 inhibitor -A1c level is 10. -RN informed that patient's potassium is low 3.2. -Holding insulin drip and I will give oral KCl 60 mEq stat repletion. -BMP showing low potassium 3.2, bicarb 17 and anion gap 12. - As anion gap has been remained closed stopping the insulin drip, starting long-acting insulin 10 unit, continue check POC blood glucose every 4 hours and starting sliding scale insulin coverage. -Resuming heart healthy carb modified diet. -Will follow-up with next BMP in 4 hours.  Tereasa Coop, MD [facility] 08/22/2023, 8:09 PM

## 2023-08-22 NOTE — Consult Note (Signed)
 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  The patient is not able to provide any history.  The information was gathered from the chart.  Per reports he started to experience confusion 1 week ago.  His actions at home were out of the ordinary.  The patient's wife noted that he was not taking his medications over the past month.  Because of his AMS, he was brought to the ER and admitted.  The patient's NH4 was elevated at 254 and he was acidotic with a moderate glucose elevation at 187.  He had positive ketones in his urine.  This time he was negative for ETOH, but his Utox was positive for cocaine.  Past Medical History:  Diagnosis Date   Alcohol abuse 12/24/2015   Cirrhosis (HCC)    Diabetes mellitus, type II (HCC)    DKA (diabetic ketoacidoses) 04/04/2019   Esophageal varices with bleeding (HCC) 04/12/2023   H/O fracture    nasal x 2   NSVT (nonsustained ventricular tachycardia) (HCC) 04/22/2020   Sleep apnea    UGIB (upper gastrointestinal bleed) 12/2019    Past Surgical History:  Procedure Laterality Date   ESOPHAGEAL BANDING  01/14/2020   Procedure: ESOPHAGEAL VARICES BANDING;  Surgeon: Jeani Hawking, MD;  Location: [facility];  Service: Endoscopy;;   ESOPHAGEAL BANDING  01/29/2023   Procedure: ESOPHAGEAL BANDING;  Surgeon: Kerin Salen, MD;  Location: [facility];  Service: Gastroenterology;;   ESOPHAGOGASTRODUODENOSCOPY N/A 01/13/2020   Procedure: ESOPHAGOGASTRODUODENOSCOPY (EGD);  Surgeon: Charna Elizabeth, MD;  Location: [facility];  Service: Endoscopy;  Laterality: N/A;   ESOPHAGOGASTRODUODENOSCOPY (EGD) WITH PROPOFOL N/A 04/05/2019   Procedure: ESOPHAGOGASTRODUODENOSCOPY (EGD) WITH PROPOFOL;  Surgeon: Jeani Hawking, MD;  Location: [facility];  Service:  Endoscopy;  Laterality: N/A;   ESOPHAGOGASTRODUODENOSCOPY (EGD) WITH PROPOFOL N/A 01/14/2020   Procedure: ESOPHAGOGASTRODUODENOSCOPY (EGD) WITH PROPOFOL;  Surgeon: Jeani Hawking, MD;  Location: [facility];  Service: Endoscopy;  Laterality: N/A;   ESOPHAGOGASTRODUODENOSCOPY (EGD) WITH PROPOFOL N/A 01/29/2023   Procedure: ESOPHAGOGASTRODUODENOSCOPY (EGD) WITH PROPOFOL;  Surgeon: Kerin Salen, MD;  Location: [facility];  Service: Gastroenterology;  Laterality: N/A;   HERNIA REPAIR     Age 58   ICD IMPLANT N/A 05/18/2020   Procedure: ICD IMPLANT;  Surgeon: Marinus Maw, MD;  Location: [facility] INVASIVE CV LAB;  Service: Cardiovascular;  Laterality: N/A;   IR EMBO VENOUS NOT HEMORR HEMANG  INC GUIDE ROADMAPPING  04/17/2023   IR PARACENTESIS  04/14/2023   IR PARACENTESIS  04/17/2023   IR RADIOLOGIST EVAL & MGMT  03/31/2023   IR RADIOLOGIST EVAL & MGMT  05/19/2023   IR THORACENTESIS ASP PLEURAL SPACE W/IMG GUIDE  04/13/2023   IR THORACENTESIS ASP PLEURAL SPACE W/IMG GUIDE  04/17/2023   IR TIPS  04/17/2023   IR US GUIDE VASC ACCESS RIGHT  04/17/2023   IR US GUIDE VASC ACCESS RIGHT  04/17/2023   LUMBAR LAMINECTOMY/DECOMPRESSION MICRODISCECTOMY N/A 12/15/2019   Procedure: LUMBAR THREE-SACRAL ONE LAMINECTOMY WITH LUMBAR FOUR-FIVE DISCECTOMY ;  Surgeon: Bethann Goo, DO;  Location: [facility] OR;  Service: Neurosurgery;  Laterality: N/A;   thoracocenteis     TIPS PROCEDURE N/A 04/17/2023   Procedure: TRANS-JUGULAR INTRAHEPATIC PORTAL SHUNT (TIPS);  Surgeon: Roanna Banning, MD;  Location[facility] OR;  Service: Radiology;  Laterality: N/A;    Family History  Problem Relation Age  of Onset   Diabetes Mother    Heart failure Mother    Valvular heart disease Mother    Diabetes Mellitus II Father    Heart failure Father    Dementia Father    Seizures Neg Hx     Social History:  reports that he quit smoking about 15 years ago. His smoking use included cigarettes. He has never used smokeless tobacco. He reports current  alcohol use. He reports that he does not use drugs.  Allergies:  Allergies  Allergen Reactions   Latex Hives and Rash    Medications: Scheduled:  lactulose  30 g Per Tube Q4H   potassium chloride  40 mEq Per Tube Once   rifaximin  550 mg Oral BID   sodium chloride flush  3 mL Intravenous Q12H   [START ON 08/23/2023] thiamine (VITAMIN B1) injection  100 mg Intravenous Q24H   Continuous:  [START ON 08/23/2023] cefTRIAXone (ROCEPHIN)  IV     dextrose 5% lactated ringers 125 mL/hr at 08/22/23 1001   insulin 0.6 Units/hr (08/22/23 1502)   lactated ringers Stopped (08/22/23 0154)   metronidazole Stopped (08/22/23 1307)    Results for orders placed or performed during the hospital encounter of 08/21/23 (from the past 24 hours)  CBG monitoring, ED     Status: Abnormal   Collection Time: 08/21/23  8:23 PM  Result Value Ref Range   Glucose-Capillary 200 (H) 70 - 99 mg/dL  CBC with Differential     Status: Abnormal   Collection Time: 08/21/23  8:31 PM  Result Value Ref Range   WBC 5.8 4.0 - 10.5 K/uL   RBC 4.76 4.22 - 5.81 MIL/uL   Hemoglobin 12.9 (L) 13.0 - 17.0 g/dL   HCT 16.1 (L) 09.6 - 04.5 %   MCV 80.7 80.0 - 100.0 fL   MCH 27.1 26.0 - 34.0 pg   MCHC 33.6 30.0 - 36.0 g/dL   RDW 40.9 (H) 81.1 - 91.4 %   Platelets 127 (L) 150 - 400 K/uL   nRBC 0.0 0.0 - 0.2 %   Neutrophils Relative % 54 %   Neutro Abs 3.1 1.7 - 7.7 K/uL   Lymphocytes Relative 26 %   Lymphs Abs 1.5 0.7 - 4.0 K/uL   Monocytes Relative 12 %   Monocytes Absolute 0.7 0.1 - 1.0 K/uL   Eosinophils Relative 6 %   Eosinophils Absolute 0.3 0.0 - 0.5 K/uL   Basophils Relative 2 %   Basophils Absolute 0.1 0.0 - 0.1 K/uL   Immature Granulocytes 0 %   Abs Immature Granulocytes 0.01 0.00 - 0.07 K/uL  Comprehensive metabolic panel     Status: Abnormal   Collection Time: 08/21/23  8:31 PM  Result Value Ref Range   Sodium 137 135 - 145 mmol/L   Potassium 4.0 3.5 - 5.1 mmol/L   Chloride 106 98 - 111 mmol/L   CO2 17 (L) 22  - 32 mmol/L   Glucose, Bld 187 (H) 70 - 99 mg/dL   BUN 13 6 - 20 mg/dL   Creatinine, Ser 7.82 0.61 - 1.24 mg/dL   Calcium 8.6 (L) 8.9 - 10.3 mg/dL   Total Protein 6.1 (L) 6.5 - 8.1 g/dL   Albumin 2.4 (L) 3.5 - 5.0 g/dL   AST 41 15 - 41 U/L   ALT 27 0 - 44 U/L   Alkaline Phosphatase 146 (H) 38 - 126 U/L   Total Bilirubin 2.2 (H) 0.0 - 1.2 mg/dL   GFR, Estimated >95 >  60 mL/min   Anion gap 14 5 - 15  Ammonia     Status: Abnormal   Collection Time: 08/21/23  8:31 PM  Result Value Ref Range   Ammonia 254 (H) 9 - 35 umol/L  Lipase, blood     Status: None   Collection Time: 08/21/23  8:31 PM  Result Value Ref Range   Lipase 30 11 - 51 U/L  Osmolality     Status: None   Collection Time: 08/21/23  8:31 PM  Result Value Ref Range   Osmolality 294 275 - 295 mOsm/kg  Protime-INR     Status: Abnormal   Collection Time: 08/21/23  8:31 PM  Result Value Ref Range   Prothrombin Time 16.2 (H) 11.4 - 15.2 seconds   INR 1.3 (H) 0.8 - 1.2  T4, free     Status: None   Collection Time: 08/21/23  8:31 PM  Result Value Ref Range   Free T4 1.02 0.61 - 1.12 ng/dL  TSH     Status: Abnormal   Collection Time: 08/21/23  8:32 PM  Result Value Ref Range   TSH 9.977 (H) 0.350 - 4.500 uIU/mL  Blood culture (routine x 2)     Status: None (Preliminary result)   Collection Time: 08/21/23  8:36 PM   Specimen: BLOOD  Result Value Ref Range   Specimen Description BLOOD SITE NOT SPECIFIED    Special Requests      BOTTLES DRAWN AEROBIC AND ANAEROBIC Blood Culture adequate volume   Culture      NO GROWTH < 12 HOURS Performed at [facility], [address], [place], [place] [address]    Report Status PENDING   Beta-hydroxybutyric acid     Status: Abnormal   Collection Time: 08/21/23  8:36 PM  Result Value Ref Range   Beta-Hydroxybutyric Acid 2.52 (H) 0.05 - 0.27 mmol/L  Acetaminophen level     Status: Abnormal   Collection Time: 08/21/23  8:36 PM  Result Value Ref Range   Acetaminophen (Tylenol),  Serum <10 (L) 10 - 30 ug/mL  Salicylate level     Status: Abnormal   Collection Time: 08/21/23  8:36 PM  Result Value Ref Range   Salicylate Lvl <7.0 (L) 7.0 - 30.0 mg/dL  Ethanol     Status: None   Collection Time: 08/21/23  8:36 PM  Result Value Ref Range   Alcohol, Ethyl (B) <10 <10 mg/dL  I-Stat CG4 Lactic Acid     Status: Abnormal   Collection Time: 08/21/23  8:42 PM  Result Value Ref Range   Lactic Acid, Venous 4.6 (HH) 0.5 - 1.9 mmol/L   Comment NOTIFIED PHYSICIAN   I-Stat venous blood gas, ([facility] ED, [facility], [facility])     Status: Abnormal   Collection Time: 08/21/23  8:42 PM  Result Value Ref Range   pH, Ven 7.463 (H) 7.25 - 7.43   pCO2, Ven 25.1 (L) 44 - 60 mmHg   pO2, Ven 60 (H) 32 - 45 mmHg   Bicarbonate 18.0 (L) 20.0 - 28.0 mmol/L   TCO2 19 (L) 22 - 32 mmol/L   O2 Saturation 93 %   Acid-base deficit 4.0 (H) 0.0 - 2.0 mmol/L   Sodium 139 135 - 145 mmol/L   Potassium 4.0 3.5 - 5.1 mmol/L   Calcium, Ion 1.09 (L) 1.15 - 1.40 mmol/L   HCT 36.0 (L) 39.0 - 52.0 %   Hemoglobin 12.2 (L) 13.0 - 17.0 g/dL   Sample type VENOUS  Blood culture (routine x 2)     Status: None (Preliminary result)   Collection Time: 08/21/23  9:22 PM   Specimen: BLOOD RIGHT HAND  Result Value Ref Range   Specimen Description BLOOD RIGHT HAND    Special Requests      BOTTLES DRAWN AEROBIC AND ANAEROBIC Blood Culture adequate volume   Culture      NO GROWTH < 12 HOURS Performed at [facility], [address] [place], [place] [address]    Report Status PENDING   Rapid urine drug screen (hospital performed)     Status: Abnormal   Collection Time: 08/21/23 10:50 PM  Result Value Ref Range   Opiates NONE DETECTED NONE DETECTED   Cocaine POSITIVE (A) NONE DETECTED   Benzodiazepines NONE DETECTED NONE DETECTED   Amphetamines NONE DETECTED NONE DETECTED   Tetrahydrocannabinol NONE DETECTED NONE DETECTED   Barbiturates NONE DETECTED NONE DETECTED  Urinalysis, w/ Reflex to Culture (Infection  Suspected) -Urine, Clean Catch     Status: Abnormal   Collection Time: 08/21/23 10:50 PM  Result Value Ref Range   Specimen Source URINE, CLEAN CATCH    Color, Urine YELLOW YELLOW   APPearance CLEAR CLEAR   Specific Gravity, Urine 1.037 (H) 1.005 - 1.030   pH 6.0 5.0 - 8.0   Glucose, UA >=500 (A) NEGATIVE mg/dL   Hgb urine dipstick SMALL (A) NEGATIVE   Bilirubin Urine NEGATIVE NEGATIVE   Ketones, ur 80 (A) NEGATIVE mg/dL   Protein, ur NEGATIVE NEGATIVE mg/dL   Nitrite NEGATIVE NEGATIVE   Leukocytes,Ua NEGATIVE NEGATIVE   RBC / HPF 6-10 0 - 5 RBC/hpf   WBC, UA 0-5 0 - 5 WBC/hpf   Bacteria, UA NONE SEEN NONE SEEN   Squamous Epithelial / HPF 0-5 0 - 5 /HPF   Mucus PRESENT   I-Stat CG4 Lactic Acid     Status: Abnormal   Collection Time: 08/22/23 12:17 AM  Result Value Ref Range   Lactic Acid, Venous 6.4 (HH) 0.5 - 1.9 mmol/L   Comment NOTIFIED PHYSICIAN   CBG monitoring, ED     Status: Abnormal   Collection Time: 08/22/23 12:56 AM  Result Value Ref Range   Glucose-Capillary 167 (H) 70 - 99 mg/dL  Basic metabolic panel     Status: Abnormal   Collection Time: 08/22/23  1:00 AM  Result Value Ref Range   Sodium 140 135 - 145 mmol/L   Potassium 3.7 3.5 - 5.1 mmol/L   Chloride 110 98 - 111 mmol/L   CO2 14 (L) 22 - 32 mmol/L   Glucose, Bld 155 (H) 70 - 99 mg/dL   BUN 14 6 - 20 mg/dL   Creatinine, Ser 1.19 0.61 - 1.24 mg/dL   Calcium 8.1 (L) 8.9 - 10.3 mg/dL   GFR, Estimated >14 >78 mL/min   Anion gap 16 (H) 5 - 15  CBG monitoring, ED     Status: Abnormal   Collection Time: 08/22/23  2:21 AM  Result Value Ref Range   Glucose-Capillary 149 (H) 70 - 99 mg/dL  CBG monitoring, ED     Status: Abnormal   Collection Time: 08/22/23  3:27 AM  Result Value Ref Range   Glucose-Capillary 116 (H) 70 - 99 mg/dL  Basic metabolic panel with GFR     Status: Abnormal   Collection Time: 08/22/23  4:22 AM  Result Value Ref Range   Sodium 141 135 - 145 mmol/L   Potassium 3.6 3.5 - 5.1 mmol/L  Chloride 111 98 - 111 mmol/L   CO2 16 (L) 22 - 32 mmol/L   Glucose, Bld 124 (H) 70 - 99 mg/dL   BUN 14 6 - 20 mg/dL   Creatinine, Ser 5.64 0.61 - 1.24 mg/dL   Calcium 8.4 (L) 8.9 - 10.3 mg/dL   GFR, Estimated >33 >29 mL/min   Anion gap 14 5 - 15  CBC     Status: Abnormal   Collection Time: 08/22/23  4:22 AM  Result Value Ref Range   WBC 9.1 4.0 - 10.5 K/uL   RBC 4.45 4.22 - 5.81 MIL/uL   Hemoglobin 12.1 (L) 13.0 - 17.0 g/dL   HCT 51.8 (L) 84.1 - 66.0 %   MCV 81.1 80.0 - 100.0 fL   MCH 27.2 26.0 - 34.0 pg   MCHC 33.5 30.0 - 36.0 g/dL   RDW 63.0 (H) 16.0 - 10.9 %   Platelets 136 (L) 150 - 400 K/uL   nRBC 0.0 0.0 - 0.2 %  Magnesium     Status: None   Collection Time: 08/22/23  4:22 AM  Result Value Ref Range   Magnesium 1.8 1.7 - 2.4 mg/dL  Phosphorus     Status: None   Collection Time: 08/22/23  4:22 AM  Result Value Ref Range   Phosphorus 2.7 2.5 - 4.6 mg/dL  Hepatic function panel     Status: Abnormal   Collection Time: 08/22/23  4:22 AM  Result Value Ref Range   Total Protein 5.6 (L) 6.5 - 8.1 g/dL   Albumin 2.3 (L) 3.5 - 5.0 g/dL   AST 40 15 - 41 U/L   ALT 25 0 - 44 U/L   Alkaline Phosphatase 129 (H) 38 - 126 U/L   Total Bilirubin 2.0 (H) 0.0 - 1.2 mg/dL   Bilirubin, Direct 0.5 (H) 0.0 - 0.2 mg/dL   Indirect Bilirubin 1.5 (H) 0.3 - 0.9 mg/dL  Protime-INR     Status: Abnormal   Collection Time: 08/22/23  4:22 AM  Result Value Ref Range   Prothrombin Time 17.4 (H) 11.4 - 15.2 seconds   INR 1.4 (H) 0.8 - 1.2  Ammonia     Status: Abnormal   Collection Time: 08/22/23  4:22 AM  Result Value Ref Range   Ammonia 52 (H) 9 - 35 umol/L  Beta-hydroxybutyric acid     Status: Abnormal   Collection Time: 08/22/23  4:22 AM  Result Value Ref Range   Beta-Hydroxybutyric Acid 1.54 (H) 0.05 - 0.27 mmol/L  Hemoglobin A1c     Status: Abnormal   Collection Time: 08/22/23  4:22 AM  Result Value Ref Range   Hgb A1c MFr Bld 10.0 (H) 4.8 - 5.6 %   Mean Plasma Glucose 240.3 mg/dL   Lactic acid, plasma     Status: Abnormal   Collection Time: 08/22/23  4:56 AM  Result Value Ref Range   Lactic Acid, Venous 3.6 (HH) 0.5 - 1.9 mmol/L  CBG monitoring, ED     Status: Abnormal   Collection Time: 08/22/23  5:27 AM  Result Value Ref Range   Glucose-Capillary 130 (H) 70 - 99 mg/dL  CBG monitoring, ED     Status: Abnormal   Collection Time: 08/22/23  6:34 AM  Result Value Ref Range   Glucose-Capillary 153 (H) 70 - 99 mg/dL  CBG monitoring, ED     Status: Abnormal   Collection Time: 08/22/23  7:32 AM  Result Value Ref Range   Glucose-Capillary 136 (H) 70 - 99 mg/dL  CBG monitoring, ED     Status: Abnormal   Collection Time: 08/22/23  8:46 AM  Result Value Ref Range   Glucose-Capillary 152 (H) 70 - 99 mg/dL  Basic metabolic panel     Status: Abnormal   Collection Time: 08/22/23  8:48 AM  Result Value Ref Range   Sodium 139 135 - 145 mmol/L   Potassium 3.7 3.5 - 5.1 mmol/L   Chloride 112 (H) 98 - 111 mmol/L   CO2 15 (L) 22 - 32 mmol/L   Glucose, Bld 384 (H) 70 - 99 mg/dL   BUN 12 6 - 20 mg/dL   Creatinine, Ser 1.61 0.61 - 1.24 mg/dL   Calcium 7.9 (L) 8.9 - 10.3 mg/dL   GFR, Estimated >09 >60 mL/min   Anion gap 12 5 - 15  Lactic acid, plasma     Status: Abnormal   Collection Time: 08/22/23  8:48 AM  Result Value Ref Range   Lactic Acid, Venous 4.2 (HH) 0.5 - 1.9 mmol/L  CBG monitoring, ED     Status: Abnormal   Collection Time: 08/22/23  9:50 AM  Result Value Ref Range   Glucose-Capillary 221 (H) 70 - 99 mg/dL  CBG monitoring, ED     Status: Abnormal   Collection Time: 08/22/23 11:43 AM  Result Value Ref Range   Glucose-Capillary 153 (H) 70 - 99 mg/dL  Glucose, capillary     Status: None   Collection Time: 08/22/23  1:04 PM  Result Value Ref Range   Glucose-Capillary 83 70 - 99 mg/dL   Comment 1 Notify RN   Glucose, capillary     Status: None   Collection Time: 08/22/23  1:36 PM  Result Value Ref Range   Glucose-Capillary 90 70 - 99 mg/dL  Basic  metabolic panel     Status: Abnormal   Collection Time: 08/22/23  1:47 PM  Result Value Ref Range   Sodium 141 135 - 145 mmol/L   Potassium 3.5 3.5 - 5.1 mmol/L   Chloride 112 (H) 98 - 111 mmol/L   CO2 19 (L) 22 - 32 mmol/L   Glucose, Bld 115 (H) 70 - 99 mg/dL   BUN 11 6 - 20 mg/dL   Creatinine, Ser 4.54 0.61 - 1.24 mg/dL   Calcium 8.7 (L) 8.9 - 10.3 mg/dL   GFR, Estimated >09 >81 mL/min   Anion gap 10 5 - 15  Beta-hydroxybutyric acid     Status: Abnormal   Collection Time: 08/22/23  1:47 PM  Result Value Ref Range   Beta-Hydroxybutyric Acid 0.52 (H) 0.05 - 0.27 mmol/L  Glucose, capillary     Status: Abnormal   Collection Time: 08/22/23  2:59 PM  Result Value Ref Range   Glucose-Capillary 118 (H) 70 - 99 mg/dL  Glucose, capillary     Status: Abnormal   Collection Time: 08/22/23  3:38 PM  Result Value Ref Range   Glucose-Capillary 157 (H) 70 - 99 mg/dL     US LIVER DOPPLER Result Date: 08/22/2023 CLINICAL DATA:  [id number] S/P TIPS (transjugular intrahepatic portosystemic shunt) [id number] EXAM: DUPLEX ULTRASOUND OF LIVER AND TIPS SHUNT TECHNIQUE: Color and duplex Doppler ultrasound was performed to evaluate the hepatic in-flow and out-flow vessels. COMPARISON:  CT CAP, 04/12/2023.  IR TIPS, 04/17/2023. FINDINGS: Suboptimal evaluation, with poor acoustic penetration secondary to patient habitus. Portal Vein Velocities Main:  52 cm/sec Right:  Not visualized Left:  Not visualized TIPS Stent Velocities Proximal:  123 cm/sec Mid:  151 Distal:  95 cm/sec  IVC: Present and patent with normal respiratory phasicity. Hepatic Vein Velocities Right:  11 cm/sec Mid:  17 cm/sec Left:  24 cm/sec Splenic Vein: Patent, with velocities measuring up to 49 cm/s Superior Mesenteric Vein: Pain, with velocities measuring up to 23 cm/s Hepatic Artery: Patent, with arterial velocity of 61 cm/s Ascities: Trace perihepatic ascites is present. Varices: Absent Other findings: A RIGHT pleural effusion is present. IMPRESSION:  1. Patent TIPS, with mid stent velocities > 150 cm/s 2. Cirrhotic morphology of liver. No sonographic evidence of hepatoma. Continue surveillance with ultrasound every 6 months per AASLD recommendation. 3. Incidental RIGHT pleural effusion is present. Roanna Banning, MD [facility] Radiology Electronically Signed   By: Roanna Banning M.D.   On: 08/22/2023 10:00   DG Abd 1 View Result Date: 08/22/2023 CLINICAL DATA:  Encounter for NG tube placement EXAM: ABDOMEN - 1 VIEW COMPARISON:  CT abdomen pelvis 04/20/2023 FINDINGS: Enteric tube tip in the stomach. The side port is in the distal esophagus. Recommend advancement 3-4 cm.TIPS. Question small right basilar pneumothorax versus projection artifact. Recommend dedicated chest radiographs. IMPRESSION: 1. Enteric tube tip in the stomach. The side port is in the distal esophagus. Recommend advancement 3-4 cm. 2. Question small right basilar pneumothorax versus projection artifact. Recommend dedicated chest radiographs. Electronically Signed   By: Minerva Fester M.D.   On: 08/22/2023 02:18   CT HEAD WO CONTRAST ( ) Result Date: 08/21/2023 CLINICAL DATA:  Delirium EXAM: CT HEAD WITHOUT CONTRAST TECHNIQUE: Contiguous axial images were obtained from the base of the skull through the vertex without intravenous contrast. RADIATION DOSE REDUCTION: This exam was performed according to the departmental dose-optimization program which includes automated exposure control, adjustment of the mA and/or kV according to patient size and/or use of iterative reconstruction technique. COMPARISON:  12/12/2019 FINDINGS: Brain: Normal anatomic configuration. No abnormal intra or extra-axial mass lesion or fluid collection. No abnormal mass effect or midline shift. No evidence of acute intracranial hemorrhage or infarct. Ventricular size is normal. Cerebellum unremarkable. Vascular: Unremarkable Skull: Intact Sinuses/Orbits: Paranasal sinuses  are clear. Orbits are unremarkable. Other: Mastoid air cells and middle ear cavities are clear. IMPRESSION: 1. No acute intracranial abnormality. Electronically Signed   By: Helyn Numbers M.D.   On: 08/21/2023 21:33   DG Chest 2 View Result Date: 08/21/2023 CLINICAL DATA:  Altered mental status EXAM: CHEST - 2 VIEW COMPARISON:  04/17/2023 FINDINGS: Small right pleural effusion. Right basilar airspace opacities. The left lung is clear. No pneumothorax. Stable cardiomediastinal silhouette. Left chest wall ICD. IMPRESSION: Small right pleural effusion and compressive atelectasis. Electronically Signed   By: Minerva Fester M.D.   On: 08/21/2023 20:57    ROS:  As stated above in the HPI otherwise negative.  Blood pressure 126/69, pulse 87, temperature 98 F (36.7 C), temperature source Axillary, resp. rate 15, weight 67.9 kg, SpO2 98%.    PE: Gen: AMS Lungs: CTA Bilaterally anteriorly CV: RRR without M/G/R ABD: Soft, NTND, +BS Ext: No C/C/E  Assessment/Plan: 1) Decompensated cirrhosis with HE. 2) Ketoacidosis - improving. 3) Polysubstance abuse.   It seems that there were multiple factors that conspired to cause his AMS, ie, no taking his medications, cocaine abuse.  He is currently receiving the proper management.  With treatment of his HE and management of his DM, his mentation should improve.  It is not known if he had issues with HE after the TIPS placement.    Plan: 1) Agree with lactulose. 2) Agree with  CTX, metronidazole. 3) At this time he does seem possible to take anything orally.  The rifaximin will help once he is able to take PO.  Kole Hilyard D 08/22/2023, 3:55 PM

## 2023-08-22 NOTE — ED Notes (Signed)
 NG tube advanced 4cm per Xray

## 2023-08-22 NOTE — H&P (Signed)
 History and Physical    Connor Vaughn JYN:[id number] DOB: 1961/05/23 DOA: 08/21/2023  PCP: Ricky Stabs, NP-C   Patient coming from: Home   Chief Complaint:  Chief Complaint  Patient presents with   Altered Mental Status    HPI: History limited due to patient's mental status, provided by patient's wife over the phone Connor Vaughn is a 63 y.o. male with hx of alcoholic cirrhosis s/p TIPS in 11/'24 no prior EGD but was supposed to be on lactulose hypertrophic cardiomyopathy, bicuspid aortic valve with moderate AS, history NSVT s/p ICD, hypertension, diabetes, CKD 3A, anemia, thrombocytopenia, depression, ongoing alcohol use and apparent polysubstance use, who was brought in from home due to progressive altered mentation.  Per patient's wife, approximately 1 week ago developed sudden onset of confusion, perseverative speech. Wife thought he was developing rapid dementia. He's been requiring more assistance with ADL (dressing). Putting diaper / clothes on backwards. Peeing all over the house. Polyphagia. Turning on burners on without anything on them. Has been vomiting daily in the morning. But not throughout day. Does not think has had any diarrhea. No fevers to her knowledge. No hematemesis / melena / hematochezia to her knowledge.   Wife and he live together but patient has been managing his own medications. Has not taken any medications over the past month except over the past 3 days wife has been administering his medications. Appears he has mixed up his medications (pill bottles with mixed medications). Wife does not know any specific medications he may have overdosed on inadvertently. Wife does not know if he was taking lactulose, she has not administered any in past few days. He has been drinking alcohol about 8 beers per day. But has not had any over the past week. Wife was not aware of any substance use although he is cocaine positive on Tox.   Review of Systems:  ROS  complete and negative except as marked above   Allergies  Allergen Reactions   Latex Hives and Rash    Prior to Admission medications   Medication Sig Start Date End Date Taking? Authorizing Provider  busPIRone (BUSPAR) 5 MG tablet Take 5 mg by mouth 2 (two) times daily.   Yes [provider]  carvedilol (COREG) 6.25 MG tablet Take 1 tablet (6.25 mg total) by mouth 2 (two) times daily. 05/09/23  Yes Little Ishikawa, MD  dapagliflozin propanediol (FARXIGA) 10 MG TABS tablet Take 10 mg by mouth daily.   Yes [provider]  DULoxetine (CYMBALTA) 30 MG capsule Take 30 mg by mouth daily.   Yes [provider]  DULoxetine (CYMBALTA) 60 MG capsule Take 60 mg by mouth daily.   Yes [provider]  ferrous sulfate 325 (65 FE) MG EC tablet Take 325 mg by mouth daily with breakfast.   Yes [provider]  furosemide (LASIX) 20 MG tablet Take 1 tablet (20 mg total) by mouth daily. 04/20/23  Yes Gherghe, Daylene Katayama, MD  montelukast (SINGULAIR) 10 MG tablet Take 10 mg by mouth every evening.   Yes [provider]  sitaGLIPtin (JANUVIA) 50 MG tablet Take 50 mg by mouth daily.   Yes [provider]    Past Medical History:  Diagnosis Date   Alcohol abuse 12/24/2015   Cirrhosis (HCC)    Diabetes mellitus, type II (HCC)    DKA (diabetic ketoacidoses) 04/04/2019   Esophageal varices with bleeding (HCC) 04/12/2023   H/O fracture    nasal x 2  NSVT (nonsustained ventricular tachycardia) (HCC) 04/22/2020   Sleep apnea    UGIB (upper gastrointestinal bleed) 12/2019    Past Surgical History:  Procedure Laterality Date   ESOPHAGEAL BANDING  01/14/2020   Procedure: ESOPHAGEAL VARICES BANDING;  Surgeon: Jeani Hawking, MD;  Location: [facility];  Service: Endoscopy;;   ESOPHAGEAL BANDING  01/29/2023   Procedure: ESOPHAGEAL BANDING;  Surgeon: Kerin Salen, MD;  Location: [facility];  Service: Gastroenterology;;    ESOPHAGOGASTRODUODENOSCOPY N/A 01/13/2020   Procedure: ESOPHAGOGASTRODUODENOSCOPY (EGD);  Surgeon: Charna Elizabeth, MD;  Location: [facility];  Service: Endoscopy;  Laterality: N/A;   ESOPHAGOGASTRODUODENOSCOPY (EGD) WITH PROPOFOL N/A 04/05/2019   Procedure: ESOPHAGOGASTRODUODENOSCOPY (EGD) WITH PROPOFOL;  Surgeon: Jeani Hawking, MD;  Location: [facility];  Service: Endoscopy;  Laterality: N/A;   ESOPHAGOGASTRODUODENOSCOPY (EGD) WITH PROPOFOL N/A 01/14/2020   Procedure: ESOPHAGOGASTRODUODENOSCOPY (EGD) WITH PROPOFOL;  Surgeon: Jeani Hawking, MD;  Location: [facility];  Service: Endoscopy;  Laterality: N/A;   ESOPHAGOGASTRODUODENOSCOPY (EGD) WITH PROPOFOL N/A 01/29/2023   Procedure: ESOPHAGOGASTRODUODENOSCOPY (EGD) WITH PROPOFOL;  Surgeon: Kerin Salen, MD;  Location: [facility];  Service: Gastroenterology;  Laterality: N/A;   HERNIA REPAIR     Age 87   ICD IMPLANT N/A 05/18/2020   Procedure: ICD IMPLANT;  Surgeon: Marinus Maw, MD;  Location[facility] INVASIVE CV LAB;  Service: Cardiovascular;  Laterality: N/A;   IR EMBO VENOUS NOT HEMORR HEMANG  INC GUIDE ROADMAPPING  04/17/2023   IR PARACENTESIS  04/14/2023   IR PARACENTESIS  04/17/2023   IR RADIOLOGIST EVAL & MGMT  03/31/2023   IR RADIOLOGIST EVAL & MGMT  05/19/2023   IR THORACENTESIS ASP PLEURAL SPACE W/IMG GUIDE  04/13/2023   IR THORACENTESIS ASP PLEURAL SPACE W/IMG GUIDE  04/17/2023   IR TIPS  04/17/2023   IR US GUIDE VASC ACCESS RIGHT  04/17/2023   IR US GUIDE VASC ACCESS RIGHT  04/17/2023   LUMBAR LAMINECTOMY/DECOMPRESSION MICRODISCECTOMY N/A 12/15/2019   Procedure: LUMBAR THREE-SACRAL ONE LAMINECTOMY WITH LUMBAR FOUR-FIVE DISCECTOMY ;  Surgeon: Bethann Goo, DO;  Location: [facility] OR;  Service: Neurosurgery;  Laterality: N/A;   thoracocenteis     TIPS PROCEDURE N/A 04/17/2023   Procedure: TRANS-JUGULAR INTRAHEPATIC PORTAL SHUNT (TIPS);  Surgeon: Roanna Banning, MD;  Location: [facility] OR;  Service: Radiology;  Laterality: N/A;     reports that  he quit smoking about 15 years ago. His smoking use included cigarettes. He has never used smokeless tobacco. He reports current alcohol use. He reports that he does not use drugs.  Family History  Problem Relation Age of Onset   Diabetes Mother    Heart failure Mother    Valvular heart disease Mother    Diabetes Mellitus II Father    Heart failure Father    Dementia Father    Seizures Neg Hx      Physical Exam: Vitals:   08/21/23 2100 08/21/23 2101 08/21/23 2200 08/21/23 2330  BP: (!) 166/88  123/68 (!) 147/79  Pulse: 94  84 97  Resp: 18  16 19   Temp:  98 F (36.7 C)    TempSrc:  Rectal    SpO2: 100%  100% 100%  Weight:        Gen: Borderline obtunded, chronically and acutely ill-appearing HEENT: Head atraumatic. PERRL, no tongue bite wound visible CV: Tachycardic,, normal S1, S2, 1/6 SEM Resp:, Kussmaul type respirations, CTAB  Abd: Flat, normoactive, nontender MSK: Symmetric, 1+ edema near the ankles, distally in UE as well with trace edema Skin: No rashes or lesions  to exposed skin  Neuro: Borderline attended, he opens his eyes to loud voice and able to maintain very brief eye contact with a sternal rub and then falls back asleep.  Neuroexam limited, no apparent focal deficit Psych: Unable to assess with his mental status   Data review:   Labs reviewed, notable for:   Ammonia 254 VBG 7.46/25, bicarb 17, anion gap 14 Glucose 187 S osm 294  Beta hydroxybutyrate 2.52 Alcohol negative Tox positive cocaine T. bili 2.2, alk phos 146 UA positive ketones and glucosuria TSH 9.9, free T4 pending  Micro:  Results for orders placed or performed during the hospital encounter of 04/12/23  SARS Coronavirus 2 by RT PCR (hospital order, performed in [facility] hospital lab) *cepheid single result test* Anterior Nasal Swab     Status: None   Collection Time: 04/13/23  2:21 AM   Specimen: Anterior Nasal Swab  Result Value Ref Range Status   SARS Coronavirus 2 by RT PCR  NEGATIVE NEGATIVE Final    Comment: Performed at [facility], [address], [place], [place] [address]  Gram stain     Status: None   Collection Time: 04/13/23 11:54 AM   Specimen: Lung, Right; Pleural Fluid  Result Value Ref Range Status   Specimen Description PLEURAL  Final   Special Requests right lung  Final   Gram Stain   Final    WBC PRESENT, PREDOMINANTLY MONONUCLEAR NO ORGANISMS SEEN CYTOSPIN SMEAR Performed at [facility], [address] [place], [place] [address]    Report Status 04/13/2023 FINAL  Final  Acid Fast Culture with reflexed sensitivities     Status: None   Collection Time: 04/13/23 11:54 AM   Specimen: Lung, Right; Pleural Fluid  Result Value Ref Range Status   Acid Fast Culture Negative  Final    Comment: (NOTE) No acid fast bacilli isolated after 6 weeks. Performed At: [facility] [address] [place], [place] [id number] Jolene Schimke MD WG:[id number]    Source of Sample PLEURAL  Final    Comment: Performed at [facility], [address] [place], [place] [address]  Acid Fast Smear (AFB)     Status: None   Collection Time: 04/13/23 11:54 AM   Specimen: Lung, Right; Pleural Fluid  Result Value Ref Range Status   AFB Specimen Processing Concentration  Final   Acid Fast Smear Negative  Final    Comment: (NOTE) Performed At: [facility] [address] [place], [place] [id number] Jolene Schimke MD KG:[id number]    Source (AFB) PLEURAL  Final    Comment: Performed at [facility], [address] [place], [place] [address]  Culture, body fluid w Gram Stain-bottle     Status: None   Collection Time: 04/13/23 11:54 AM   Specimen: Pleura  Result Value Ref Range Status   Specimen Description PLEURAL  Final   Special Requests right lung  Final   Culture   Final    NO GROWTH 5 DAYS Performed at [facility], [address] [place], [place] [address]    Report Status 04/18/2023 FINAL  Final   Culture, body fluid w Gram Stain-bottle     Status: None   Collection Time: 04/14/23 10:50 AM   Specimen: Peritoneal Washings  Result Value Ref Range Status   Specimen Description PERITONEAL  Final   Special Requests NONE  Final   Culture   Final    NO GROWTH 5 DAYS  Performed at [facility], [address] [place], [place] [address]    Report Status 04/19/2023 FINAL  Final  Gram stain     Status: None   Collection Time: 04/14/23 10:50 AM   Specimen: Peritoneal Washings  Result Value Ref Range Status   Specimen Description PERITONEAL  Final   Special Requests NONE  Final   Gram Stain   Final    WBC PRESENT, PREDOMINANTLY MONONUCLEAR NO ORGANISMS SEEN CYTOSPIN SMEAR Performed at [facility], [address], [place], [place] [address]    Report Status 04/14/2023 FINAL  Final    Imaging reviewed:  CT HEAD WO CONTRAST ( ) Result Date: 08/21/2023 CLINICAL DATA:  Delirium EXAM: CT HEAD WITHOUT CONTRAST TECHNIQUE: Contiguous axial images were obtained from the base of the skull through the vertex without intravenous contrast. RADIATION DOSE REDUCTION: This exam was performed according to the departmental dose-optimization program which includes automated exposure control, adjustment of the mA and/or kV according to patient size and/or use of iterative reconstruction technique. COMPARISON:  12/12/2019 FINDINGS: Brain: Normal anatomic configuration. No abnormal intra or extra-axial mass lesion or fluid collection. No abnormal mass effect or midline shift. No evidence of acute intracranial hemorrhage or infarct. Ventricular size is normal. Cerebellum unremarkable. Vascular: Unremarkable Skull: Intact Sinuses/Orbits: Paranasal sinuses are clear. Orbits are unremarkable. Other: Mastoid air cells and middle ear cavities are clear. IMPRESSION: 1. No acute intracranial abnormality. Electronically Signed   By: Helyn Numbers M.D.   On: 08/21/2023 21:33   DG Chest 2 View Result Date:  08/21/2023 CLINICAL DATA:  Altered mental status EXAM: CHEST - 2 VIEW COMPARISON:  04/17/2023 FINDINGS: Small right pleural effusion. Right basilar airspace opacities. The left lung is clear. No pneumothorax. Stable cardiomediastinal silhouette. Left chest wall ICD. IMPRESSION: Small right pleural effusion and compressive atelectasis. Electronically Signed   By: Minerva Fester M.D.   On: 08/21/2023 20:57    EKG:  Personally reviewed NSR, LAD, borderline LAE, no acute ischemic changes.  ED Course:  Treated with lactulose enema, 2 L normal saline   Assessment/Plan:  63 y.o. male with hx alcoholic cirrhosis s/p TIPS in 11/'24 no prior EGD but was supposed to be on lactulose hypertrophic cardiomyopathy, bicuspid aortic valve with moderate AS, history NSVT s/p ICD, hypertension, diabetes, CKD 3A, anemia, thrombocytopenia, depression, ongoing alcohol use and apparent polysubstance use, who was brought in from home due to progressive altered mentation.   Encephalopathy, progressive 1 week progressive encephalopathy and now borderline obtunded.  Suspect multifactorial in the setting of hepatic encephalopathy status post TIPS with hyperammonemia (254), possible euglycemic or other ketoacidosis with volume depletion, possible inadvertent overdose, ongoing substance use (recent alcohol, none in past week, + cocaine).  - PCCM consulted for evaluation with overall decompensation, borderline obtundation, worsening lactic acidosis despite fluids. Patient is DNR / DNI, confirmed with wife.  - Management of hepatic encephalopathy, ketoacidosis/volume depletion per below - Monitor for developing withdrawal syndrome in setting of his substance use  Decompensated cirrhosis s/p TIPS in 11/' 24 Hepatic encephalopathy, severe range hyperammonemia - GI consult, messaged Dr. Elnoria Howard overnight (former patient of his) for routine consult in the morning - Place smallbore NG tube for enteral administration of lactulose.   Continue 30 mL every 4 hour until mental status is improving.  Titrate to 3-4 bowel movements daily - Add rifaximin 550 mg twice daily when able to take orally - Check liver duplex to eval for TIPS function - Follow-up blood cultures - Started  on ceftriaxone 2 g IV every 24 hours for empiric coverage of SBP with overall worsening course + rising lactic acidosis.   Ketoacidosis, ?  Euglycemic DKA in setting of SGLT2 v starvation or alcohol.  On initial evaluation glucose 187, VBG 7.46/25, bicarb 17, anion gap 14, S osm 294, Beta hydroxybutyrate 2.52, UA +++ ketone and glucose. Alcohol negative.  Per wife he has been polyphagic and polyuric so doubt starvation and has not drank alcohol within past week per her report.  - Give thiamine 500 mg IV x 1 - After received thiamine start on insulin drip with D5 LR  - Serial BMP every 4 hours until gap closes x 2, serial potassium repletion - S/p 2 L normal saline but lactate is worsening.  Continue on maintenance fluids for DKA per above.  Suspect still volume down, will consider additional bolus  - Would discontinue his SGLT2 inhibitor at time of discharge due to potential for euglycemic DKA.   Lactic acidosis, worsening Clinically without other SIRS criteria.  Borderline tachycardic.  Suspect the lactate and setting of hypovolemia, possibly may have an occult infection that we have not yet identified.  Will have delayed clearance of lactate in setting of his liver disease. - S/p 2 L IV fluid, maintenance fluid per DKA.  Considering additional bolus - Holding home Lasix - Trend lactate every 4 hours for now - Started on ceftriaxone per above, f/u blood cultures   Polysubstance use Positive cocaine tox Ongoing alcohol use despite alcoholic cirrhosis  Drinking 8 beers per day although none in the past week, positive tox for cocaine (wife thinks possibly on Saturday when he was out with friends).  -Monitor for withdrawal syndrome -TOC for substance use  resources  Acute liver injury, cholestatic pattern Mild elevation in T. bili 2.2, alk phos 146, other LFT normal.  Suspect in setting of his underlying cirrhosis and alcohol use. - Liver duplex pending - Trend LFTs  Elevated TSH - Check free T4  Chronic medical problems:-> Currently with no enteral route for meds, holding home oral meds until able to take PO  Hypertrophic cardiomyopathy, bicuspid aortic valve, moderate AS: Clinically hypovolemic on presentation.  Consider repeat TTE to eval for changes in AS.  History of NSVT / HCM, s/p AICD: Noted Hypertension: Hold to some Coreg in setting of his acute illness CKD stage IIIa: Per chart review, ?  Creatinine may underestimate due to sarcopenia.  Baseline creatinine near 0.8 with EGFR more so in CKD 2 range History anemia: Not an active problem hemoglobin 12 History thrombocytopenia: Platelet 127.  Continue to monitor Mood d/o: Holding his home duloxetine, BuSpar for now  ? Asthma: Holding his Singulair for now  Body mass index is 24.16 kg/m.  DVT prophylaxis:  SCDs Code Status:  Full Code Diet:  Diet Orders (From admission, onward)     Start     Ordered   08/22/23 0009  Diet NPO time specified Except for: Sips with Meds  Diet effective now       Question:  Except for  Answer:  Clearance Coots with Meds   08/22/23 0012           Family Communication: Yes discussed with wife over the phone Consults: PCCM, GI Admission status:   Inpatient, Step Down Unit  Severity of Illness: The appropriate patient status for this patient is INPATIENT. Inpatient status is judged to be reasonable and necessary in order to provide the required intensity of service to ensure the patient's safety.  The patient's presenting symptoms, physical exam findings, and initial radiographic and laboratory data in the context of their chronic comorbidities is felt to place them at high risk for further clinical deterioration. Furthermore, it is not anticipated that the  patient will be medically stable for discharge from the hospital within 2 midnights of admission.   * I certify that at the point of admission it is my clinical judgment that the patient will require inpatient hospital care spanning beyond 2 midnights from the point of admission due to high intensity of service, high risk for further deterioration and high frequency of surveillance required.*   Dolly Rias, MD [facility]  How to contact the [facility] Attending or Consulting provider 7A - 7P or covering provider during after hours 7P -7A, for this patient.  Check the care team in [facility] and look for a) attending/consulting [facility] provider listed and b) the [facility] team listed Log into [email or web address] and use [place]'s universal password to access. If you do not have the password, please contact the hospital operator. Locate the [facility] provider you are looking for under [facility] and page to a number that you can be directly reached. If you still have difficulty reaching the provider, please page the [facility] (Director on Call) for the Hospitalists listed on amion for assistance.  08/22/2023, 12:43 AM

## 2023-08-22 NOTE — Progress Notes (Signed)
   Follow Up Note  HPI: Full HPI done earlier this morning  Briefly, pt admitted earlier this morning for altered mental status, known history of alcoholic cirrhosis s/p TIPS, history of NSVT s/p ICD, CKD 3a, ongoing alcohol use, as well as polysubstance use presented due to worsening altered mental status.  Patient unable to provide history, so history was obtained from wife.  Noted worsening mental status, with sudden onset of confusion, which has been going on for the past 1 week.  Patient continues to drink about 8 beers per day and noted to be cocaine positive on tox screen.  Admitted for further management.   Today, patient currently encephalopathic, unable to respond to any questions asked or obey simple commands.  Patient was awake, alert, confused.  Exam: General: Awake, alert, confused, unable to respond to any questions asked goal be simple commands Cardiovascular: S1, S2 present Respiratory: CTAB Abdomen: Soft, nontender, nondistended, bowel sounds present Musculoskeletal: No bilateral pedal edema noted Skin: Normal Psychiatry: Unable to assess  Present on Admission:  Hepatic encephalopathy (HCC)  Lactic acidosis  Ketoacidosis   Possible acute metabolic encephalopathy Worsening, likely multifactorial in setting of hepatic encephalopathy, elevated ammonia level, possible euglycemic ketoacidosis, volume depletion, ongoing substance abuse plus alcohol use UA negative for infection PCCM consulted to assist with management, appreciate recs Management as below  Likely decompensated cirrhosis Elevated ammonia S/p TIPS in 2024 NG tube for lactulose, titrate to 3-4 bowel movements daily Liver duplex with patent TIPS Continue ceftriaxone for empiric coverage of SBP Rifaximin once able to take orally CMP  Possible aspiration PNA Continue IV ceftriaxone, Flagyl  Euglycemic DKA In the setting of alcohol use, poor oral intake, SGLT2 Continue insulin drip until bicarb has  normalized Continue SGLT2 inhibitor at time of discharge due to potential for euglycemic DKA BMP  Lactic acidosis Possible multifactorial PCCM on board IV fluids, trend levels  Other chronic problems, please refer to full H&P for details

## 2023-08-23 DIAGNOSIS — K7682 Hepatic encephalopathy: Secondary | ICD-10-CM | POA: Diagnosis not present

## 2023-08-23 LAB — LACTIC ACID, PLASMA
Lactic Acid, Venous: 4 mmol/L (ref 0.5–1.9)
Lactic Acid, Venous: 3.8 mmol/L (ref 0.5–1.9)
Lactic Acid, Venous: 3.4 mmol/L (ref 0.5–1.9)
Lactic Acid, Venous: 2.6 mmol/L (ref 0.5–1.9)

## 2023-08-23 LAB — COMPREHENSIVE METABOLIC PANEL WITH GFR
ALT: 53 U/L — ABNORMAL HIGH (ref 0–44)
AST: 161 U/L — ABNORMAL HIGH (ref 15–41)
Albumin: 2.7 g/dL — ABNORMAL LOW (ref 3.5–5.0)
Alkaline Phosphatase: 163 U/L — ABNORMAL HIGH (ref 38–126)
Anion gap: 16 — ABNORMAL HIGH (ref 5–15)
BUN: 8 mg/dL (ref 6–20)
CO2: 16 mmol/L — ABNORMAL LOW (ref 22–32)
Calcium: 8.9 mg/dL (ref 8.9–10.3)
Chloride: 113 mmol/L — ABNORMAL HIGH (ref 98–111)
Creatinine, Ser: 0.94 mg/dL (ref 0.61–1.24)
GFR, Estimated: >60 mL/min (ref >60)
Glucose, Bld: 131 mg/dL — ABNORMAL HIGH (ref 70–99)
Potassium: 4 mmol/L (ref 3.5–5.1)
Sodium: 145 mmol/L (ref 135–145)
Total Bilirubin: 2.2 mg/dL — ABNORMAL HIGH (ref 0.0–1.2)
Total Protein: 6.9 g/dL (ref 6.5–8.1)

## 2023-08-23 LAB — CBC WITH DIFFERENTIAL/PLATELET
Abs Immature Granulocytes: 0.03 10*3/uL (ref 0.00–0.07)
Basophils Absolute: 0.2 10*3/uL — ABNORMAL HIGH (ref 0.0–0.1)
Basophils Relative: 2 %
Eosinophils Absolute: 0.4 10*3/uL (ref 0.0–0.5)
Eosinophils Relative: 4 %
HCT: 42 % (ref 39.0–52.0)
Hemoglobin: 13.9 g/dL (ref 13.0–17.0)
Immature Granulocytes: 0 %
Lymphocytes Relative: 27 %
Lymphs Abs: 2.5 10*3/uL (ref 0.7–4.0)
MCH: 27.4 pg (ref 26.0–34.0)
MCHC: 33.1 g/dL (ref 30.0–36.0)
MCV: 82.7 fL (ref 80.0–100.0)
Monocytes Absolute: 1.5 10*3/uL — ABNORMAL HIGH (ref 0.1–1.0)
Monocytes Relative: 16 %
Neutro Abs: 4.7 10*3/uL (ref 1.7–7.7)
Neutrophils Relative %: 51 %
Platelets: 144 10*3/uL — ABNORMAL LOW (ref 150–400)
RBC: 5.08 MIL/uL (ref 4.22–5.81)
RDW: 17.4 % — ABNORMAL HIGH (ref 11.5–15.5)
WBC: 9.3 10*3/uL (ref 4.0–10.5)
nRBC: 0 % (ref 0.0–0.2)

## 2023-08-23 LAB — BASIC METABOLIC PANEL WITH GFR
Anion gap: 12 (ref 5–15)
Anion gap: 9 (ref 5–15)
BUN: 9 mg/dL (ref 6–20)
BUN: 7 mg/dL (ref 6–20)
CO2: 16 mmol/L — ABNORMAL LOW (ref 22–32)
CO2: 19 mmol/L — ABNORMAL LOW (ref 22–32)
Calcium: 8.5 mg/dL — ABNORMAL LOW (ref 8.9–10.3)
Calcium: 8.5 mg/dL — ABNORMAL LOW (ref 8.9–10.3)
Chloride: 116 mmol/L — ABNORMAL HIGH (ref 98–111)
Chloride: 110 mmol/L (ref 98–111)
Creatinine, Ser: 0.94 mg/dL (ref 0.61–1.24)
Creatinine, Ser: 1.02 mg/dL (ref 0.61–1.24)
GFR, Estimated: >60 mL/min (ref >60)
GFR, Estimated: >60 mL/min (ref >60)
Glucose, Bld: 151 mg/dL — ABNORMAL HIGH (ref 70–99)
Glucose, Bld: 240 mg/dL — ABNORMAL HIGH (ref 70–99)
Potassium: 3.6 mmol/L (ref 3.5–5.1)
Potassium: 3.9 mmol/L (ref 3.5–5.1)
Sodium: 144 mmol/L (ref 135–145)
Sodium: 138 mmol/L (ref 135–145)

## 2023-08-23 LAB — CUP PACEART REMOTE DEVICE CHECK
Date Time Interrogation Session: 20250331112352
Implantable Lead Connection Status: 753985
Implantable Lead Implant Date: 20211227
Implantable Lead Location: 753860
Implantable Lead Model: 436909
Implantable Lead Serial Number: 81421424
Implantable Pulse Generator Implant Date: 20211227
Pulse Gen Model: 429522
Pulse Gen Serial Number: 84812005

## 2023-08-23 LAB — AMMONIA: Ammonia: 41 umol/L — ABNORMAL HIGH (ref 9–35)

## 2023-08-23 LAB — GLUCOSE, CAPILLARY
Glucose-Capillary: 147 mg/dL — ABNORMAL HIGH (ref 70–99)
Glucose-Capillary: 101 mg/dL — ABNORMAL HIGH (ref 70–99)
Glucose-Capillary: 114 mg/dL — ABNORMAL HIGH (ref 70–99)
Glucose-Capillary: 226 mg/dL — ABNORMAL HIGH (ref 70–99)
Glucose-Capillary: 206 mg/dL — ABNORMAL HIGH (ref 70–99)
Glucose-Capillary: 191 mg/dL — ABNORMAL HIGH (ref 70–99)

## 2023-08-23 MED ORDER — LORAZEPAM 1 MG PO TABS: 1.0000 mg | ORAL_TABLET | ORAL | Status: DC | PRN

## 2023-08-23 MED ORDER — LORAZEPAM 2 MG/ML IJ SOLN: 0.0000 mg | Freq: Four times a day (QID) | INTRAMUSCULAR | Status: AC

## 2023-08-23 MED ORDER — LACTULOSE 10 GM/15ML PO SOLN: 30.0000 g | ORAL | Status: DC

## 2023-08-23 MED ORDER — LACTULOSE 10 GM/15ML PO SOLN
30.0000 g | Freq: Three times a day (TID) | ORAL | Status: DC
  Administered 2023-08-23 – 2023-08-24 (×6): 30 g via ORAL
  Filled 2023-08-23 (×7): qty 60

## 2023-08-23 MED ORDER — ENSURE ENLIVE PO LIQD
237.0000 mL | Freq: Two times a day (BID) | ORAL | Status: DC
  Administered 2023-08-25 – 2023-08-26 (×3): 237 mL via ORAL

## 2023-08-23 MED ORDER — LORAZEPAM 2 MG/ML IJ SOLN: 1.0000 mg | INTRAMUSCULAR | Status: DC | PRN

## 2023-08-23 MED ORDER — LACTATED RINGERS IV SOLN: INTRAVENOUS | Status: DC

## 2023-08-23 MED ORDER — LORAZEPAM 2 MG/ML IJ SOLN: 0.0000 mg | Freq: Two times a day (BID) | INTRAMUSCULAR | Status: DC

## 2023-08-23 MED ORDER — ADULT MULTIVITAMIN W/MINERALS CH
1.0000 | ORAL_TABLET | Freq: Every day | ORAL | Status: DC
  Administered 2023-08-23 – 2023-08-26 (×4): 1 via ORAL
  Filled 2023-08-23 (×4): qty 1

## 2023-08-23 MED ORDER — THIAMINE HCL 100 MG/ML IJ SOLN
500.0000 mg | INTRAVENOUS | Status: DC
  Administered 2023-08-24 – 2023-08-25 (×2): 500 mg via INTRAVENOUS
  Filled 2023-08-23 (×3): qty 5

## 2023-08-23 MED ORDER — FOLIC ACID 1 MG PO TABS
1.0000 mg | ORAL_TABLET | Freq: Every day | ORAL | Status: DC
  Administered 2023-08-23 – 2023-08-26 (×4): 1 mg via ORAL
  Filled 2023-08-23 (×4): qty 1

## 2023-08-23 MED ORDER — LACTATED RINGERS IV BOLUS
1000.0000 mL | INTRAVENOUS | Status: AC
  Administered 2023-08-23: 1000 mL via INTRAVENOUS

## 2023-08-23 MED ORDER — LACTATED RINGERS IV BOLUS: 1000.0000 mL | Freq: Once | INTRAVENOUS | Status: DC

## 2023-08-23 NOTE — Progress Notes (Signed)
 Lactic acid has been trending up to 4.  Giving 1 L of LR bolus and after I will continue maintenance fluid at 100 cc/h.  Tereasa Coop, MD [facility] 08/23/2023, 3:29 AM

## 2023-08-23 NOTE — Progress Notes (Signed)
 PROGRESS NOTE    Connor Vaughn  ZOX:[id number] DOB: 06/12/1960 DOA: 08/21/2023 PCP: Ricky Stabs, NP-C    Chief Complaint  Patient presents with   Altered Mental Status    Brief Narrative:    pt admitted  for altered mental status, known history of alcoholic cirrhosis s/p TIPS, history of NSVT s/p ICD, CKD 3a, ongoing alcohol use, as well as polysubstance use presented due to worsening altered mental status. Patient continues to drink about 8 beers per day and noted to be cocaine positive on tox screen.  Admitted for further management.    Assessment & Plan:   Principal Problem:   Hepatic encephalopathy (HCC) Active Problems:   Ketoacidosis   Lactic acidosis   Decompensated cirrhosis (HCC)   Polysubstance use disorder   Elevated lactic acid level   History of alcohol abuse    Acute metabolic encephalopathy Acute hepatic encephalopathy Acute toxic encephalopathy This is multifactorial, in the setting of medication noncompliance, hyperammonemia, cocaine and alcohol abuse.  With a euglycemic ketoacidosis -Mentation improving, continue with lactulose.    Likely decompensated cirrhosis Elevated ammonia S/p TIPS in 2024 Thrombocytopenia Continue with lactulose, started on rifaximin per GI as well, continue with Rocephin Her ammonia closely   Possible aspiration PNA Continue IV ceftriaxone, Flagyl   Euglycemic DKA In the setting of alcohol use, poor oral intake, SGLT2 Continue insulin drip until bicarb has normalized Continue SGLT2 inhibitor at time of discharge due to potential for euglycemic DKA BMP   Lactic acidosis Resolved with IV fluids  Polysubstance abuse Gall abuse Cocaine abuse -Continue with CIWA protocol  Hypertrophic cardiomyopathy, bicuspid aortic valve, moderate AS: Clinically hypovolemic on presentation.  Consider repeat TTE to eval for changes in AS.   History of NSVT / HCM, s/p AICD: Noted  Hypertension: Hold to some Coreg in  setting of his acute illness  CKD stage IIIa: Per chart review, ?  Creatinine may underestimate due to sarcopenia.  Baseline creatinine near 0.8 with EGFR more so in CKD 2 range  History anemia: Not an active problem hemoglobin 12  History thrombocytopenia:   Continue to monitor Mood d/o: Holding his home duloxetine, BuSpar for now   DVT prophylaxis: SCD Code Status: DNR Family Communication: None at bedside Disposition:   Status is: Inpatient    Consultants:  GI   Subjective: Patient with multiple BMs overnight as discussed with staff.  Objective: Vitals:   08/22/23 2000 08/22/23 2200 08/23/23 0000 08/23/23 0307  BP: 128/67  126/69 (!) 160/80  Pulse: 83 86 88 85  Resp: 16 13 14 14   Temp: 97.9 F (36.6 C)  97.8 F (36.6 C) 98.1 F (36.7 C)  TempSrc: Axillary  Oral Oral  SpO2: 100% 100% 100% 100%  Weight:        Intake/Output Summary (Last 24 hours) at 08/23/2023 1433 Last data filed at 08/23/2023 0439 Gross per 24 hour  Intake 3728.29 ml  Output 550 ml  Net 3178.29 ml   Filed Weights   08/21/23 2025  Weight: 67.9 kg    Examination:  Awake confused, no apparent distress Symmetrical Chest wall movement, Good air movement bilaterally, CTAB RRR,No Gallops,Rubs or new Murmurs, No Parasternal Heave +ve B.Sounds, Abd Soft, No tenderness, No rebound - guarding or rigidity. No Cyanosis, Clubbing or edema, No new Rash or bruise       Data Reviewed: I have personally reviewed following labs and imaging studies  CBC: Recent Labs  Lab 08/21/23 2031 08/21/23 2042 08/22/23 0422 08/23/23 0230  WBC 5.8  --  9.1 9.3  NEUTROABS 3.1  --   --  4.7  HGB 12.9* 12.2* 12.1* 13.9  HCT 38.4* 36.0* 36.1* 42.0  MCV 80.7  --  81.1 82.7  PLT 127*  --  136* 144*    Basic Metabolic Panel: Recent Labs  Lab 08/22/23 0422 08/22/23 0848 08/22/23 1347 08/22/23 1718 08/22/23 1903 08/23/23 0006 08/23/23 1053  NA 141   < > 141 141 143 144 145  K 3.6   < > 3.5 3.4* 3.2*  3.6 4.0  CL 111   < > 112* 113* 114* 116* 113*  CO2 16*   < > 19* 20* 17* 16* 16*  GLUCOSE 124*   < > 115* 165* 171* 151* 131*  BUN 14   < > 11 11 9 9 8   CREATININE 0.93   < > 0.96 0.92 0.87 0.94 0.94  CALCIUM 8.4*   < > 8.7* 8.6* 8.5* 8.5* 8.9  MG 1.8  --   --   --   --   --   --   PHOS 2.7  --   --   --   --   --   --    < > = values in this interval not displayed.    GFR: Estimated Creatinine Clearance: 75.4 mL/min (by C-G formula based on SCr of 0.94 mg/dL).  Liver Function Tests: Recent Labs  Lab 08/21/23 2031 08/22/23 0422 08/23/23 1053  AST 41 40 161*  ALT 27 25 53*  ALKPHOS 146* 129* 163*  BILITOT 2.2* 2.0* 2.2*  PROT 6.1* 5.6* 6.9  ALBUMIN 2.4* 2.3* 2.7*    CBG: Recent Labs  Lab 08/22/23 1922 08/22/23 2329 08/23/23 0305 08/23/23 0838 08/23/23 1249  GLUCAP 167* 153* 147* 101* 114*     Recent Results (from the past 240 hours)  Blood culture (routine x 2)     Status: None (Preliminary result)   Collection Time: 08/21/23  8:36 PM   Specimen: BLOOD  Result Value Ref Range Status   Specimen Description BLOOD SITE NOT SPECIFIED  Final   Special Requests   Final    BOTTLES DRAWN AEROBIC AND ANAEROBIC Blood Culture adequate volume   Culture   Final    NO GROWTH 2 DAYS Performed at [facility], [address] [place], [place] [address]    Report Status PENDING  Incomplete  Blood culture (routine x 2)     Status: None (Preliminary result)   Collection Time: 08/21/23  9:22 PM   Specimen: BLOOD RIGHT HAND  Result Value Ref Range Status   Specimen Description BLOOD RIGHT HAND  Final   Special Requests   Final    BOTTLES DRAWN AEROBIC AND ANAEROBIC Blood Culture adequate volume   Culture   Final    NO GROWTH 2 DAYS Performed at [facility], [address] [place], [place] [address]    Report Status PENDING  Incomplete         Radiology Studies: DG Abd 1 View Result Date: 08/22/2023 CLINICAL DATA:  Confirm NG tube placement EXAM:  ABDOMEN - 1 VIEW COMPARISON:  Same day abdominal radiographs FINDINGS: Enteric tube tip and side-port in the stomach.  TIPS. IMPRESSION: Enteric tube tip and side-port in the stomach. Electronically Signed   By: Minerva Fester M.D.   On: 08/22/2023 20:32   US LIVER DOPPLER Result Date: 08/22/2023 CLINICAL DATA:  [id number] S/P TIPS (transjugular intrahepatic portosystemic shunt) [id number] EXAM: DUPLEX ULTRASOUND  OF LIVER AND TIPS SHUNT TECHNIQUE: Color and duplex Doppler ultrasound was performed to evaluate the hepatic in-flow and out-flow vessels. COMPARISON:  CT CAP, 04/12/2023.  IR TIPS, 04/17/2023. FINDINGS: Suboptimal evaluation, with poor acoustic penetration secondary to patient habitus. Portal Vein Velocities Main:  52 cm/sec Right:  Not visualized Left:  Not visualized TIPS Stent Velocities Proximal:  123 cm/sec Mid:  151 Distal:  95 cm/sec IVC: Present and patent with normal respiratory phasicity. Hepatic Vein Velocities Right:  11 cm/sec Mid:  17 cm/sec Left:  24 cm/sec Splenic Vein: Patent, with velocities measuring up to 49 cm/s Superior Mesenteric Vein: Pain, with velocities measuring up to 23 cm/s Hepatic Artery: Patent, with arterial velocity of 61 cm/s Ascities: Trace perihepatic ascites is present. Varices: Absent Other findings: A RIGHT pleural effusion is present. IMPRESSION: 1. Patent TIPS, with mid stent velocities > 150 cm/s 2. Cirrhotic morphology of liver. No sonographic evidence of hepatoma. Continue surveillance with ultrasound every 6 months per AASLD recommendation. 3. Incidental RIGHT pleural effusion is present. Roanna Banning, MD [facility] Electronically Signed   By: Roanna Banning M.D.   On: 08/22/2023 10:00   DG Abd 1 View Result Date: 08/22/2023 CLINICAL DATA:  Encounter for NG tube placement EXAM: ABDOMEN - 1 VIEW COMPARISON:  CT abdomen pelvis 04/20/2023 FINDINGS: Enteric tube tip in the stomach. The side port is in the distal  esophagus. Recommend advancement 3-4 cm.TIPS. Question small right basilar pneumothorax versus projection artifact. Recommend dedicated chest radiographs. IMPRESSION: 1. Enteric tube tip in the stomach. The side port is in the distal esophagus. Recommend advancement 3-4 cm. 2. Question small right basilar pneumothorax versus projection artifact. Recommend dedicated chest radiographs. Electronically Signed   By: Minerva Fester M.D.   On: 08/22/2023 02:18   CT HEAD WO CONTRAST ( ) Result Date: 08/21/2023 CLINICAL DATA:  Delirium EXAM: CT HEAD WITHOUT CONTRAST TECHNIQUE: Contiguous axial images were obtained from the base of the skull through the vertex without intravenous contrast. RADIATION DOSE REDUCTION: This exam was performed according to the departmental dose-optimization program which includes automated exposure control, adjustment of the mA and/or kV according to patient size and/or use of iterative reconstruction technique. COMPARISON:  12/12/2019 FINDINGS: Brain: Normal anatomic configuration. No abnormal intra or extra-axial mass lesion or fluid collection. No abnormal mass effect or midline shift. No evidence of acute intracranial hemorrhage or infarct. Ventricular size is normal. Cerebellum unremarkable. Vascular: Unremarkable Skull: Intact Sinuses/Orbits: Paranasal sinuses are clear. Orbits are unremarkable. Other: Mastoid air cells and middle ear cavities are clear. IMPRESSION: 1. No acute intracranial abnormality. Electronically Signed   By: Helyn Numbers M.D.   On: 08/21/2023 21:33   DG Chest 2 View Result Date: 08/21/2023 CLINICAL DATA:  Altered mental status EXAM: CHEST - 2 VIEW COMPARISON:  04/17/2023 FINDINGS: Small right pleural effusion. Right basilar airspace opacities. The left lung is clear. No pneumothorax. Stable cardiomediastinal silhouette. Left chest wall ICD. IMPRESSION: Small right pleural effusion and compressive atelectasis. Electronically Signed   By: Minerva Fester M.D.    On: 08/21/2023 20:57        Scheduled Meds:  insulin aspart  0-6 Units Subcutaneous Q4H   insulin glargine-yfgn  10 Units Subcutaneous QHS   lactulose  30 g Oral TID   rifaximin  550 mg Oral BID   sodium chloride flush  3 mL Intravenous Q12H   thiamine (VITAMIN B1) injection  100 mg Intravenous Q24H   Continuous Infusions:  cefTRIAXone (ROCEPHIN)  IV 2 g (08/22/23 2329)   lactated ringers 125 mL/hr at 08/23/23 0439   metronidazole 500 mg (08/23/23 1107)     LOS: 1 day      Huey Bienenstock, MD [facility]   To contact the attending provider between 7A-7P or the covering provider during after hours 7P-7A, please log into the web site [email or web address] and access using universal [place] password for that web site. If you do not have the password, please call the hospital operator.  08/23/2023, 2:33 PM

## 2023-08-23 NOTE — Evaluation (Signed)
 Physical Therapy Evaluation Patient Details Name: Connor Vaughn MRN: [id number] DOB: Jan 03, 1961 Today's Date: 08/23/2023  History of Present Illness  Pt is a 63 y/o M admitted on 08/21/23 after presenting with c/o AMS that began ~1 week prior. Pt is being treated for encephalopathy, suspect multifactorial in the setting of hepatic encephalopathy, ongoing substance use. PMH: alcoholic cirrhosis s/p TIPS 11/24, hypertrophic cardiomyopathy, bicuspid aortic valve with moderate AS, NSVT s/p ICD, HTN, DM, CKD3A, anemia, thrombocytopenia, depression, ongoing alcohol use & apparent polysubstance use, Hep C, L3-S1 Laminectomy & L4-5 discectomy 7/21  Clinical Impression  Pt seen for PT evaluation with pt received in bed & agreeable to tx, no family/friends present during session. Pt is oriented to name & birthday but not age, oriented to place but not situation. Pt requires cuing to initiate almost all mobility tasks. Pt is able to transfer STS with min assist & progress to ambulation in hallway with RW & min assist. Pt presents with significant cognitive deficits that impact mobility and increase his fall risk. Pt would benefit from ongoing PT services to progress mobility & safety as able.        If plan is discharge home, recommend the following: A little help with bathing/dressing/bathroom;A little help with walking and/or transfers;Assistance with cooking/housework;Assist for transportation;Help with stairs or ramp for entrance;Supervision due to cognitive status   Can travel by private vehicle   Yes    Equipment Recommendations Rolling walker (2 wheels)  Recommendations for Other Services       Functional Status Assessment Patient has had a recent decline in their functional status and demonstrates the ability to make significant improvements in function in a reasonable and predictable amount of time.     Precautions / Restrictions Precautions Precautions: Fall Restrictions Weight Bearing  Restrictions Per Provider Order: No      Mobility  Bed Mobility Overal bed mobility: Needs Assistance Bed Mobility: Supine to Sit, Sit to Supine     Supine to sit: Supervision, HOB elevated, Used rails Sit to supine: Supervision        Transfers Overall transfer level: Needs assistance Equipment used: 1 person hand held assist Transfers: Sit to/from Stand, Bed to chair/wheelchair/BSC Sit to Stand: Min assist   Step pivot transfers: Min assist, +2 physical assistance (bed>chair on R)            Ambulation/Gait Ambulation/Gait assistance: Min assist (2nd person present for safety) Gait Distance (Feet): 60 Feet Assistive device: Rolling walker (2 wheels) Gait Pattern/deviations: Decreased step length - right, Decreased step length - left, Decreased stride length Gait velocity: decreased, multiple pauses/standing breaks     General Gait Details: decreased awareness of environment requiring max assist to maneuver around obstacles in hallway & maintain linear path, as well as proper hand placement on RW  Stairs            Wheelchair Mobility     Tilt Bed    Modified Rankin (Stroke Patients Only)       Balance Overall balance assessment: Needs assistance Sitting-balance support: Feet supported Sitting balance-Leahy Scale: Fair Sitting balance - Comments: supervision static sitting   Standing balance support: During functional activity, Bilateral upper extremity supported, Reliant on assistive device for balance Standing balance-Leahy Scale: Poor                               Pertinent Vitals/Pain Pain Assessment Pain Assessment: Faces Faces Pain Scale: No hurt  Home Living Family/patient expects to be discharged to:: Private residence (information taken from previous chart entry) Living Arrangements: Spouse/significant other Available Help at Discharge: Family Type of Home: Mobile home Home Access: Stairs to enter Entrance  Stairs-Rails: Right     Home Layout: One level Home Equipment: Agricultural consultant (2 wheels) Additional Comments: Pt does report wife works.    Prior Function Prior Level of Function : Patient poor historian/Family not available             Mobility Comments: Pt reports he uses RW at baseline but pt is poor historian. ADLs Comments: Pt reports he wears brief at baseline but pt is poor historian.     Extremity/Trunk Assessment   Upper Extremity Assessment Upper Extremity Assessment: Generalized weakness    Lower Extremity Assessment Lower Extremity Assessment: Generalized weakness       Communication   Communication Communication: No apparent difficulties    Cognition Arousal: Alert Behavior During Therapy: Flat affect   PT - Cognitive impairments: No family/caregiver present to determine baseline, Orientation, Awareness, Memory, Attention, Initiation, Sequencing, Problem solving, Safety/Judgement   Orientation impairments: Time, Situation (oriented to self & birthday but not age)                     Following commands: Impaired Following commands impaired: Follows one step commands inconsistently, Follows one step commands with increased time     Cueing Cueing Techniques: Verbal cues, Gestural cues, Visual cues, Tactile cues     General Comments General comments (skin integrity, edema, etc.): VSS throughout session. Pt received with incontinent BM & pt unaware; pt requires total assist for peri hygiene.    Exercises     Assessment/Plan    PT Assessment Patient needs continued PT services  PT Problem List Decreased strength;Decreased coordination;Decreased cognition;Decreased activity tolerance;Decreased balance;Decreased mobility;Decreased safety awareness;Decreased knowledge of use of DME       PT Treatment Interventions DME instruction;Balance training;Gait training;Neuromuscular re-education;Stair training;Functional mobility training;Therapeutic  activities;Therapeutic exercise;Patient/family education;Manual techniques    PT Goals (Current goals can be found in the Care Plan section)  Acute Rehab PT Goals PT Goal Formulation: Patient unable to participate in goal setting Time For Goal Achievement: 09/06/23 Potential to Achieve Goals: Fair    Frequency Min 2X/week     Co-evaluation               AM-PAC PT "6 Clicks" Mobility  Outcome Measure Help needed turning from your back to your side while in a flat bed without using bedrails?: A Little Help needed moving from lying on your back to sitting on the side of a flat bed without using bedrails?: A Little Help needed moving to and from a bed to a chair (including a wheelchair)?: A Little Help needed standing up from a chair using your arms (e.g., wheelchair or bedside chair)?: A Little Help needed to walk in hospital room?: A Lot Help needed climbing 3-5 steps with a railing? : A Lot 6 Click Score: 16    End of Session   Activity Tolerance: Patient tolerated treatment well Patient left: in bed;with call bell/phone within reach;with bed alarm set (BUE mittens donned) Nurse Communication: Mobility status (IV coming out) PT Visit Diagnosis: Unsteadiness on feet (R26.81);Muscle weakness (generalized) (M62.81);Other abnormalities of gait and mobility (R26.89)    Time: 8469-6295 PT Time Calculation (min) (ACUTE ONLY): 29 min   Charges:   PT Evaluation $PT Eval Moderate Complexity: 1 Mod   PT General Charges $$ ACUTE  PT VISIT: 1 Visit         Aleda Grana, PT, DPT 08/23/23, 9:10 AM   Sandi Mariscal 08/23/2023, 9:09 AM

## 2023-08-23 NOTE — Plan of Care (Signed)

## 2023-08-23 NOTE — Evaluation (Signed)
 Occupational Therapy Evaluation Patient Details Name: Connor Vaughn MRN: [id number] DOB: [birth year] Today's Date: 08/23/2023   History of Present Illness   Pt is a 63 y/o M admitted on 08/21/23 after presenting with c/o AMS that began ~1 week prior. Pt is being treated for encephalopathy, suspect multifactorial in the setting of hepatic encephalopathy, ongoing substance use. PMH: alcoholic cirrhosis s/p TIPS 11/24, hypertrophic cardiomyopathy, bicuspid aortic valve with moderate AS, NSVT s/p ICD, HTN, DM, CKD3A, anemia, thrombocytopenia, depression, ongoing alcohol use & apparent polysubstance use, Hep C, L3-S1 Laminectomy & L4-5 discectomy 7/21     Clinical Impressions Pt has difficulty expressing self, when asked Pt has no complaints. Pt poor historian, inconsistent with answering questions, history gathered from notes. Pt lives at home with wife, states his daughter also lives with him and assists, uses RW, wears brief at baseline. Pt currently max A with max verbal cueing for all ADLs for task initiation and sequencing, recommending postacute rehab <3hrs/day to maximize functional independence, if Pt and family refuse postacute rehab, HHOT recommended. Will continue to follow acutely, will follow up with family regarding DME needs.      If plan is discharge home, recommend the following:   A lot of help with walking and/or transfers;A lot of help with bathing/dressing/bathroom;Assistance with cooking/housework;Assist for transportation;Help with stairs or ramp for entrance     Functional Status Assessment   Patient has had a recent decline in their functional status and demonstrates the ability to make significant improvements in function in a reasonable and predictable amount of time.     Equipment Recommendations   Other (comment) (TBD)     Recommendations for Other Services         Precautions/Restrictions   Precautions Precautions: Fall Recall of  Precautions/Restrictions: Intact Restrictions Weight Bearing Restrictions Per Provider Order: No     Mobility Bed Mobility Overal bed mobility: Needs Assistance Bed Mobility: Supine to Sit, Sit to Supine     Supine to sit: Supervision, HOB elevated, Used rails Sit to supine: Supervision   General bed mobility comments: supervision, max cueing for task initaition and sequencing    Transfers Overall transfer level: Needs assistance Equipment used: 1 person hand held assist Transfers: Sit to/from Stand, Bed to chair/wheelchair/BSC Sit to Stand: Min assist     Step pivot transfers: Min assist, +2 physical assistance     General transfer comment: min A for STS and maintaining balance and guiding with HHA or RW      Balance Overall balance assessment: Needs assistance Sitting-balance support: Feet supported Sitting balance-Leahy Scale: Fair Sitting balance - Comments: supervision static sitting   Standing balance support: During functional activity, Bilateral upper extremity supported, Reliant on assistive device for balance Standing balance-Leahy Scale: Poor Standing balance comment: stands with at least 1 hand support,                           ADL either performed or assessed with clinical judgement   ADL Overall ADL's : Needs assistance/impaired                                       General ADL Comments: max A for all ADLs with max cueing for sequencing and task initiation.     Vision         Perception  Praxis         Pertinent Vitals/Pain       Extremity/Trunk Assessment Upper Extremity Assessment Upper Extremity Assessment: RUE deficits/detail;LUE deficits/detail RUE Deficits / Details: poor coordination/FM skills, has gross grip, limited due to cognition RUE Coordination: decreased fine motor;decreased gross motor LUE Deficits / Details: poor coordination/FM skills, has gross grip, limited due to cognition LUE  Coordination: decreased fine motor;decreased gross motor   Lower Extremity Assessment Lower Extremity Assessment: Defer to PT evaluation       Communication Communication Communication: Impaired Factors Affecting Communication: Difficulty expressing self   Cognition Arousal: Alert Behavior During Therapy: Flat affect Cognition: No family/caregiver present to determine baseline             OT - Cognition Comments: inconsistent with answers, difficulty following commands, poor historian, not able to self advocate                 Following commands: Impaired Following commands impaired: Follows one step commands inconsistently, Follows one step commands with increased time     Cueing  General Comments   Cueing Techniques: Verbal cues;Gestural cues;Visual cues;Tactile cues      Exercises     Shoulder Instructions      Home Living Family/patient expects to be discharged to:: Private residence Living Arrangements: Spouse/significant other Available Help at Discharge: Family Type of Home: Mobile home Home Access: Stairs to enter   Entrance Stairs-Rails: Right Home Layout: One level               Home Equipment: Agricultural consultant (2 wheels)   Additional Comments: Pt reports wife works, daughter helps take care of him.      Prior Functioning/Environment Prior Level of Function : Patient poor historian/Family not available             Mobility Comments: Pt reports he uses RW at baseline but pt is poor historian. ADLs Comments: Pt reports he wears brief at baseline but pt is poor historian.    OT Problem List: Decreased strength;Decreased activity tolerance;Impaired balance (sitting and/or standing);Decreased cognition;Impaired UE functional use   OT Treatment/Interventions: Self-care/ADL training;Therapeutic exercise;Energy conservation;DME and/or AE instruction;Therapeutic activities;Patient/family education;Balance training      OT Goals(Current  goals can be found in the care plan section)   Acute Rehab OT Goals Patient Stated Goal: not able to participate in goal setting OT Goal Formulation: Patient unable to participate in goal setting Time For Goal Achievement: 09/06/23 Potential to Achieve Goals: Fair   OT Frequency:  Min 2X/week    Co-evaluation              AM-PAC OT "6 Clicks" Daily Activity     Outcome Measure Help from another person eating meals?: A Lot Help from another person taking care of personal grooming?: A Lot Help from another person toileting, which includes using toliet, bedpan, or urinal?: A Lot Help from another person bathing (including washing, rinsing, drying)?: A Lot Help from another person to put on and taking off regular upper body clothing?: A Lot Help from another person to put on and taking off regular lower body clothing?: A Lot 6 Click Score: 12   End of Session Equipment Utilized During Treatment: Gait belt;Rolling walker (2 wheels) Nurse Communication: Mobility status  Activity Tolerance: Patient tolerated treatment well Patient left: in bed;with call bell/phone within reach;with bed alarm set  OT Visit Diagnosis: Unsteadiness on feet (R26.81);Other abnormalities of gait and mobility (R26.89);Muscle weakness (generalized) (M62.81)  Time: 1140-1205 OT Time Calculation (min): 25 min Charges:  OT General Charges $OT Visit: 1 Visit OT Evaluation $OT Eval Moderate Complexity: 1 Mod OT Treatments $Self Care/Home Management : 8-22 mins  [place], OTR/L   Alexis Goodell 08/23/2023, 1:31 PM

## 2023-08-23 NOTE — Progress Notes (Signed)
 Initial Nutrition Assessment  DOCUMENTATION CODES:   Non-severe (moderate) malnutrition in context of chronic illness  INTERVENTION:  Ensure Enlive po BID, each supplement provides 350 kcal and 20 grams of protein.  Magic cup TID with meals, each supplement provides 290 kcal and 9 grams of protein  Continue MVI, thiamine, folic acid  NUTRITION DIAGNOSIS:  Moderate Malnutrition related to chronic illness (alcoholic cirrhosis s/p TIPS, CKD III, EtOH abuse, polysubstance use)) as evidenced by moderate muscle depletion, moderate fat depletion, percent weight loss.  GOAL:  Patient will meet greater than or equal to 90% of their needs  MONITOR:  PO intake, Supplement acceptance, Labs, Weight trends  REASON FOR ASSESSMENT:  Consult Assessment of nutrition requirement/status  ASSESSMENT:  Pt with PMH: alcoholic cirrhosis s/p TIPS (11/24), hx of NSVT s/p ICD, CKDIII, T2DM, ongoing EtOH use, polysubstance use, and depression. Admitted for AMS x1 week. Found to have hepatic encephalopathy.  4/1 admitted, NGT placed 4/2 patient removed NGT; oral diet initiated  Pt consumes 8 beers per day, on average. Tox screen positive for cocaine. Wife reported patient worsening mental status x1 week. Required increased assistance with ADLs. Reports noncompliance with medication regimen x1 month. + ketones in urine.   No family at bedside during visit today. He remains mentally altered and unreliable historian, but pleasant. Unable to obtain nutrition-related history. No documented intake to review. Patient removed his NGT, which was being used for lactulose administration. Passed bedside swallow screen. MD preferring to assess PO intake before replacing tube for artificial nutrition.  Admit/Current Weight: 67.9kg  Per chart review, pt with 9.6% wt loss in last six months and 8.8% in the last three months.  This is considered clinically significant for the three month time frame. Pt unable to endorse UBW.  No edema on exam.  Has required potassium and calcium gluconate supplementation. Thiamine and folic acid supplementation in place as well as MVI.  Continues on IV ABX due to possible aspiration PNA.   Meds: SSI 0-6 Q4, Semglee 10 QD, lactulose, thiamine, MVI, folic acid, IV ABX    Labs:  Na+ 144 (wdl) K+ 3.4>3.2>3.6 (wdl) Lactic Acid 4.2>3.5>4.0 (H) CBGs 151-171 x24 hours A1c 10.0 (08/2023)  NUTRITION - FOCUSED PHYSICAL EXAM: Flowsheet Row Most Recent Value  Orbital Region Mild depletion  Upper Arm Region Moderate depletion  Thoracic and Lumbar Region Moderate depletion  Buccal Region Moderate depletion  Temple Region Moderate depletion  Clavicle Bone Region Mild depletion  Clavicle and Acromion Bone Region Mild depletion  Scapular Bone Region Moderate depletion  Dorsal Hand Mild depletion  Patellar Region Moderate depletion  Anterior Thigh Region Moderate depletion  Posterior Calf Region Moderate depletion  Edema (RD Assessment) None  Hair Reviewed  Eyes Reviewed  Mouth Reviewed  Nails Reviewed      Diet Order:   Diet Order             Diet heart healthy/carb modified Room service appropriate? Yes; Fluid consistency: Thin  Diet effective now             EDUCATION NEEDS:   Not appropriate for education at this time  Skin:  Skin Assessment: Reviewed RN Assessment  Last BM:  4/1  Height:  Ht Readings from Last 1 Encounters:  05/09/23 5\' 6"  (1.676 m)   Weight:  Wt Readings from Last 1 Encounters:  08/21/23 67.9 kg    BMI:  Body mass index is 24.16 kg/m.  Estimated Nutritional Needs:   Kcal:  1900-2100kcal  Protein:  90-105g  Fluid:  >1.9L/day  Myrtie Cruise MS, RD, LDN Registered Dietitian Clinical Nutrition RD Inpatient Contact Info in Amion

## 2023-08-23 NOTE — Progress Notes (Addendum)
 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  Currently he is alert, able to follow commands passed swallow screen.  Holding off replacing the NG tube. -Ammonia has been improved 254 to 52.  Patient has 4 massive bowel movements. -Changing per tube to oral lactulose 30 g every 4 hour to 3 times daily goal to have bowel movement 2-3 times per day.    Tereasa Coop, MD [facility] 08/23/2023, 3:24 AM

## 2023-08-23 NOTE — TOC Progression Note (Signed)
 Transition of Care [facility]) - Progression Note    Patient Details  Name: Connor Vaughn MRN: [id number] Date of Birth: May 26, 1960  [facility] of [facility]) CM/SW Contact  Mearl Latin, LCSW Phone Number: 08/23/2023, 12:45 PM  Clinical Narrative:    CSW met with patient who had slow processing. He reported that he lives with his wife, Fannie Knee (wife is [name]). He declined SNF placement and stated he wants to go home. He stated he has a walker at home and goes to [facility]. He provided consent to speak with his wife as well. CSW spoke about patient's cocaine use and patient stated "it is not an issue." Declined resources.  CSW provided update to [place] with [employer] who is going to get in touch with hi wife.       Barriers to Discharge: Continued Medical Work up  Expected Discharge Plan and Services In-house Referral: Clinical Social Work     Living arrangements for the past 2 months: Single Family Home                                       Social Determinants of Health (SDOH) Interventions SDOH Screenings   Food Insecurity: Food Insecurity Present (08/22/2023)  Housing: Low Risk  (08/22/2023)  Transportation Needs: No Transportation Needs (08/22/2023)  Utilities: At Risk (08/22/2023)  Tobacco Use: Medium Risk (08/21/2023)    Readmission Risk Interventions    04/19/2023   10:14 AM  Readmission Risk Prevention Plan  Transportation Screening Complete  PCP or Specialist Appt within 5-7 Days Complete  Home Care Screening Complete  Medication Review (RN CM) Referral to Pharmacy

## 2023-08-24 DIAGNOSIS — E440 Moderate protein-calorie malnutrition: Secondary | ICD-10-CM | POA: Insufficient documentation

## 2023-08-24 DIAGNOSIS — K7682 Hepatic encephalopathy: Secondary | ICD-10-CM | POA: Diagnosis not present

## 2023-08-24 LAB — COMPREHENSIVE METABOLIC PANEL WITH GFR
ALT: 42 U/L (ref 0–44)
AST: 101 U/L — ABNORMAL HIGH (ref 15–41)
Albumin: 2.1 g/dL — ABNORMAL LOW (ref 3.5–5.0)
Alkaline Phosphatase: 116 U/L (ref 38–126)
Anion gap: 16 — ABNORMAL HIGH (ref 5–15)
BUN: 8 mg/dL (ref 6–20)
CO2: 16 mmol/L — ABNORMAL LOW (ref 22–32)
Calcium: 8.1 mg/dL — ABNORMAL LOW (ref 8.9–10.3)
Chloride: 107 mmol/L (ref 98–111)
Creatinine, Ser: 0.77 mg/dL (ref 0.61–1.24)
GFR, Estimated: >60 mL/min (ref >60)
Glucose, Bld: 138 mg/dL — ABNORMAL HIGH (ref 70–99)
Potassium: 3.1 mmol/L — ABNORMAL LOW (ref 3.5–5.1)
Sodium: 139 mmol/L (ref 135–145)
Total Bilirubin: 1.5 mg/dL — ABNORMAL HIGH (ref 0.0–1.2)
Total Protein: 5.4 g/dL — ABNORMAL LOW (ref 6.5–8.1)

## 2023-08-24 LAB — GLUCOSE, CAPILLARY
Glucose-Capillary: 159 mg/dL — ABNORMAL HIGH (ref 70–99)
Glucose-Capillary: 209 mg/dL — ABNORMAL HIGH (ref 70–99)
Glucose-Capillary: 214 mg/dL — ABNORMAL HIGH (ref 70–99)
Glucose-Capillary: 245 mg/dL — ABNORMAL HIGH (ref 70–99)
Glucose-Capillary: 261 mg/dL — ABNORMAL HIGH (ref 70–99)
Glucose-Capillary: 82 mg/dL (ref 70–99)

## 2023-08-24 LAB — CBC
HCT: 34.5 % — ABNORMAL LOW (ref 39.0–52.0)
Hemoglobin: 11.7 g/dL — ABNORMAL LOW (ref 13.0–17.0)
MCH: 27.7 pg (ref 26.0–34.0)
MCHC: 33.9 g/dL (ref 30.0–36.0)
MCV: 81.8 fL (ref 80.0–100.0)
Platelets: 109 10*3/uL — ABNORMAL LOW (ref 150–400)
RBC: 4.22 MIL/uL (ref 4.22–5.81)
RDW: 16.9 % — ABNORMAL HIGH (ref 11.5–15.5)
WBC: 6.1 10*3/uL (ref 4.0–10.5)
nRBC: 0 % (ref 0.0–0.2)

## 2023-08-24 LAB — AMMONIA: Ammonia: 36 umol/L — ABNORMAL HIGH (ref 9–35)

## 2023-08-24 LAB — PHOSPHORUS: Phosphorus: 3.6 mg/dL (ref 2.5–4.6)

## 2023-08-24 MED ORDER — STERILE WATER FOR INJECTION IV SOLN: INTRAVENOUS | Status: DC

## 2023-08-24 MED ORDER — SODIUM BICARBONATE 650 MG PO TABS
650.0000 mg | ORAL_TABLET | Freq: Three times a day (TID) | ORAL | Status: DC
  Administered 2023-08-24 – 2023-08-26 (×6): 650 mg via ORAL
  Filled 2023-08-24 (×6): qty 1

## 2023-08-24 MED ORDER — POTASSIUM CHLORIDE CRYS ER 20 MEQ PO TBCR
40.0000 meq | EXTENDED_RELEASE_TABLET | Freq: Four times a day (QID) | ORAL | Status: AC
  Administered 2023-08-24 (×2): 40 meq via ORAL
  Filled 2023-08-24 (×2): qty 2

## 2023-08-24 NOTE — Plan of Care (Signed)

## 2023-08-24 NOTE — Progress Notes (Signed)
 Subjective: No complaints.  Objective: Vital signs in last 24 hours: Temp:  [97.7 F (36.5 C)-98.5 F (36.9 C)] 97.7 F (36.5 C) (04/03 1207) Pulse Rate:  [79-93] 93 (04/03 1207) Resp:  [9-13] 13 (04/03 0755) BP: (107-146)/(67-84) 138/84 (04/03 1207) SpO2:  [93 %-100 %] 100 % (04/03 1207) Last BM Date : 08/24/23  Intake/Output from previous day: No intake/output data recorded. Intake/Output this shift: No intake/output data recorded.  General appearance: alert and no distress GI: soft, non-tender; bowel sounds normal; no masses,  no organomegaly  Lab Results: Recent Labs    08/22/23 0422 08/23/23 0230 08/24/23 0518  WBC 9.1 9.3 6.1  HGB 12.1* 13.9 11.7*  HCT 36.1* 42.0 34.5*  PLT 136* 144* 109*   BMET Recent Labs    08/23/23 1053 08/23/23 2113 08/24/23 0518  NA 145 138 139  K 4.0 3.9 3.1*  CL 113* 110 107  CO2 16* 19* 16*  GLUCOSE 131* 240* 138*  BUN 8 7 8   CREATININE 0.94 1.02 0.77  CALCIUM 8.9 8.5* 8.1*   LFT Recent Labs    08/22/23 0422 08/23/23 1053 08/24/23 0518  PROT 5.6*   < > 5.4*  ALBUMIN 2.3*   < > 2.1*  AST 40   < > 101*  ALT 25   < > 42  ALKPHOS 129*   < > 116  BILITOT 2.0*   < > 1.5*  BILIDIR 0.5*  --   --   IBILI 1.5*  --   --    < > = values in this interval not displayed.   PT/INR Recent Labs    08/21/23 2031 08/22/23 0422  LABPROT 16.2* 17.4*  INR 1.3* 1.4*   Hepatitis Panel No results for input(s): "HEPBSAG", "HCVAB", "HEPAIGM", "HEPBIGM" in the last 72 hours. C-Diff No results for input(s): "CDIFFTOX" in the last 72 hours. Fecal Lactopherrin No results for input(s): "FECLLACTOFRN" in the last 72 hours.  Studies/Results: DG Abd 1 View Result Date: 08/22/2023 CLINICAL DATA:  Confirm NG tube placement EXAM: ABDOMEN - 1 VIEW COMPARISON:  Same day abdominal radiographs FINDINGS: Enteric tube tip and side-port in the stomach.  TIPS. IMPRESSION: Enteric tube tip and side-port in the stomach. Electronically Signed   By: Minerva Fester M.D.   On: 08/22/2023 20:32    Medications: Scheduled:  feeding supplement  237 mL Oral BID BM   folic acid  1 mg Oral Daily   insulin aspart  0-6 Units Subcutaneous Q4H   insulin glargine-yfgn  10 Units Subcutaneous QHS   lactulose  30 g Oral TID   LORazepam  0-4 mg Intravenous Q6H   Followed by   Melene Muller ON 08/25/2023] LORazepam  0-4 mg Intravenous Q12H   multivitamin with minerals  1 tablet Oral Daily   potassium chloride  40 mEq Oral Q6H   rifaximin  550 mg Oral BID   sodium bicarbonate  650 mg Oral TID   sodium chloride flush  3 mL Intravenous Q12H   Continuous:  cefTRIAXone (ROCEPHIN)  IV 2 g (08/23/23 2342)   metronidazole 500 mg (08/24/23 1021)   thiamine (VITAMIN B1) injection 500 mg (08/24/23 1342)    Assessment/Plan: 1) Decompensated cirrhosis. 2) Hepatic encephalopathy - resolved. 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 4) Polysubstance abuse.   The patient's mentation is normal.  He was strongly encouraged to seek professional help with his addictions.  From the hepatic standpoint he is stable.  Plan: 1) Maintain medical compliance. 2) Seek professional  help for substance abuse. 3) Follow up with Primary GI. 4) Signing off.  LOS: 2 days   Sariah Henkin D 08/24/2023, 2:11 PM

## 2023-08-24 NOTE — Plan of Care (Signed)

## 2023-08-24 NOTE — Progress Notes (Signed)
 PROGRESS NOTE    Connor Vaughn  ZOX:[id number] DOB: [birth year] DOA: 08/21/2023 PCP: Ricky Stabs, NP-C    Chief Complaint  Patient presents with   Altered Mental Status    Brief Narrative:    pt admitted  for altered mental status, known history of alcoholic cirrhosis s/p TIPS, history of NSVT s/p ICD, CKD 3a, ongoing alcohol use, as well as polysubstance use presented due to worsening altered mental status. Patient continues to drink about 8 beers per day and noted to be cocaine positive on tox screen.  Admitted for further management.    Assessment & Plan:   Principal Problem:   Hepatic encephalopathy (HCC) Active Problems:   Ketoacidosis   Lactic acidosis   Decompensated cirrhosis (HCC)   Polysubstance use disorder   Elevated lactic acid level   History of alcohol abuse   Malnutrition of moderate degree    Acute metabolic encephalopathy Acute hepatic encephalopathy Acute toxic encephalopathy This is multifactorial, in the setting of medication noncompliance, hyperammonemia, cocaine and alcohol abuse.  With a euglycemic ketoacidosis -Mentation improving, continue with lactulose.    Likely decompensated cirrhosis Elevated ammonia S/p TIPS in 2024 Thrombocytopenia Continue with lactulose, started on rifaximin per GI as well, continue with Rocephin Her ammonia closely   Possible aspiration PNA Continue IV ceftriaxone, Flagyl   Euglycemic DKA In the setting of alcohol use, poor oral intake, SGLT2 Continue insulin drip until bicarb has normalized Continue to hold SGLT2 inhibitor at time of discharge due to potential for euglycemic DKA BMP   Lactic acidosis Resolved with IV fluids  Anion gap metabolic acidosis -Most likely combination between lactic acidosis and has euglycemic DKA, bicarb is low today at 16, will start on p.o. bicarb  Polysubstance abuse Alcohol abuse Cocaine abuse -Continue with CIWA protocol -Denies any history of cocaine  use!!  Hypertrophic cardiomyopathy, bicuspid aortic valve, moderate AS: Clinically hypovolemic on presentation.  Consider repeat TTE to eval for changes in AS.   History of NSVT / HCM, s/p AICD: Noted  Hypertension: Hold to some Coreg in setting of his acute illness  CKD stage IIIa: Per chart review, ?  Creatinine may underestimate due to sarcopenia.  Baseline creatinine near 0.8 with EGFR more so in CKD 2 range  History anemia: Not an active problem hemoglobin 12  History thrombocytopenia:   Continue to monitor Mood d/o: Holding his home duloxetine, BuSpar for now   DVT prophylaxis: SCD Code Status: DNR Family Communication: None at bedside, discussed with daughter by phone Disposition:   Status is: Inpatient    Consultants:  GI   Subjective:  Patient with multiple BMs overnight  Objective: Vitals:   08/24/23 0740 08/24/23 0745 08/24/23 0750 08/24/23 0755  BP:    (!) 146/75  Pulse: 90 84  87  Resp: 11 13 13 13   Temp:    97.8 F (36.6 C)  TempSrc:    Oral  SpO2: 100% 93%  94%  Weight:       No intake or output data in the 24 hours ending 08/24/23 1310  Filed Weights   08/21/23 2025  Weight: 67.9 kg    Examination:  Awake Alert, Oriented X 3, is more appropriate and coherent today, pale, deconditioned Symmetrical Chest wall movement, Good air movement bilaterally, CTAB RRR,No Gallops,Rubs or new Murmurs, No Parasternal Heave +ve B.Sounds, Abd Soft, No tenderness, No rebound - guarding or rigidity. No Cyanosis, Clubbing, trace edema.    Data Reviewed: I have personally reviewed following labs and  imaging studies  CBC: Recent Labs  Lab 08/21/23 2031 08/21/23 2042 08/22/23 0422 08/23/23 0230 08/24/23 0518  WBC 5.8  --  9.1 9.3 6.1  NEUTROABS 3.1  --   --  4.7  --   HGB 12.9* 12.2* 12.1* 13.9 11.7*  HCT 38.4* 36.0* 36.1* 42.0 34.5*  MCV 80.7  --  81.1 82.7 81.8  PLT 127*  --  136* 144* 109*    Basic Metabolic Panel: Recent Labs  Lab  08/22/23 0422 08/22/23 0848 08/22/23 1903 08/23/23 0006 08/23/23 1053 08/23/23 2113 08/24/23 0518  NA 141   < > 143 144 145 138 139  K 3.6   < > 3.2* 3.6 4.0 3.9 3.1*  CL 111   < > 114* 116* 113* 110 107  CO2 16*   < > 17* 16* 16* 19* 16*  GLUCOSE 124*   < > 171* 151* 131* 240* 138*  BUN 14   < > 9 9 8 7 8   CREATININE 0.93   < > 0.87 0.94 0.94 1.02 0.77  CALCIUM 8.4*   < > 8.5* 8.5* 8.9 8.5* 8.1*  MG 1.8  --   --   --   --   --   --   PHOS 2.7  --   --   --   --   --  3.6   < > = values in this interval not displayed.    GFR: Estimated Creatinine Clearance: 88.6 mL/min (by C-G formula based on SCr of 0.77 mg/dL).  Liver Function Tests: Recent Labs  Lab 08/21/23 2031 08/22/23 0422 08/23/23 1053 08/24/23 0518  AST 41 40 161* 101*  ALT 27 25 53* 42  ALKPHOS 146* 129* 163* 116  BILITOT 2.2* 2.0* 2.2* 1.5*  PROT 6.1* 5.6* 6.9 5.4*  ALBUMIN 2.4* 2.3* 2.7* 2.1*    CBG: Recent Labs  Lab 08/23/23 1939 08/23/23 2329 08/24/23 0308 08/24/23 0755 08/24/23 1208  GLUCAP 206* 191* 159* 82 209*     Recent Results (from the past 240 hours)  Blood culture (routine x 2)     Status: None (Preliminary result)   Collection Time: 08/21/23  8:36 PM   Specimen: BLOOD  Result Value Ref Range Status   Specimen Description BLOOD SITE NOT SPECIFIED  Final   Special Requests   Final    BOTTLES DRAWN AEROBIC AND ANAEROBIC Blood Culture adequate volume   Culture   Final    NO GROWTH 3 DAYS Performed at [facility], [address] [place], [place] [address]    Report Status PENDING  Incomplete  Blood culture (routine x 2)     Status: None (Preliminary result)   Collection Time: 08/21/23  9:22 PM   Specimen: BLOOD RIGHT HAND  Result Value Ref Range Status   Specimen Description BLOOD RIGHT HAND  Final   Special Requests   Final    BOTTLES DRAWN AEROBIC AND ANAEROBIC Blood Culture adequate volume   Culture   Final    NO GROWTH 3 DAYS Performed at [facility],  [address], [place], [place] [address]    Report Status PENDING  Incomplete         Radiology Studies: DG Abd 1 View Result Date: 08/22/2023 CLINICAL DATA:  Confirm NG tube placement EXAM: ABDOMEN - 1 VIEW COMPARISON:  Same day abdominal radiographs FINDINGS: Enteric tube tip and side-port in the stomach.  TIPS. IMPRESSION: Enteric tube tip and side-port in the stomach. Electronically Signed  By: Minerva Fester M.D.   On: 08/22/2023 20:32        Scheduled Meds:  feeding supplement  237 mL Oral BID BM   folic acid  1 mg Oral Daily   insulin aspart  0-6 Units Subcutaneous Q4H   insulin glargine-yfgn  10 Units Subcutaneous QHS   lactulose  30 g Oral TID   LORazepam  0-4 mg Intravenous Q6H   Followed by   Melene Muller ON 08/25/2023] LORazepam  0-4 mg Intravenous Q12H   multivitamin with minerals  1 tablet Oral Daily   rifaximin  550 mg Oral BID   sodium chloride flush  3 mL Intravenous Q12H   Continuous Infusions:  cefTRIAXone (ROCEPHIN)  IV 2 g (08/23/23 2342)   metronidazole 500 mg (08/24/23 1021)   thiamine (VITAMIN B1) injection       LOS: 2 days      Huey Bienenstock, MD [facility]   To contact the attending provider between 7A-7P or the covering provider during after hours 7P-7A, please log into the web site [email or web address] and access using universal [place] password for that web site. If you do not have the password, please call the hospital operator.  08/24/2023, 1:10 PM

## 2023-08-24 NOTE — TOC Progression Note (Signed)
 Transition of Care [facility]) - Progression Note    Patient Details  Name: Connor Vaughn MRN: [id number] Date of Birth: January 14, 1961  Transition of Care [facility]) CM/SW Contact  Mearl Latin, LCSW Phone Number: 08/24/2023, 4:35 PM  Clinical Narrative:    CSW left voicemail for [place] with [facility] to discuss spouse requesting SNF for patient though patient is refusing.      Barriers to Discharge: Continued Medical Work up  Expected Discharge Plan and Services In-house Referral: Clinical Social Work     Living arrangements for the past 2 months: Single Family Home                                       Social Determinants of Health (SDOH) Interventions SDOH Screenings   Food Insecurity: Food Insecurity Present (08/22/2023)  Housing: Low Risk  (08/22/2023)  Transportation Needs: No Transportation Needs (08/22/2023)  Utilities: At Risk (08/22/2023)  Tobacco Use: Medium Risk (08/21/2023)    Readmission Risk Interventions    04/19/2023   10:14 AM  Readmission Risk Prevention Plan  Transportation Screening Complete  PCP or Specialist Appt within 5-7 Days Complete  Home Care Screening Complete  Medication Review (RN CM) Referral to Pharmacy

## 2023-08-24 NOTE — TOC Progression Note (Signed)
 Transition of Care [facility]) - Progression Note    Patient Details  Name: Connor Vaughn MRN: [id number] Date of Birth: May 03, 1961  Transition of Care [facility]) CM/SW Contact  Alesia Richards, RN 08/24/2023, 12:33 PM  Clinical Narrative:      CM discussed case with Dr. Randol Kern regarding discharge care planning. Hospital day 3 with diagnosis of Hepatic Encephalopathy. SNF recommended. Per Matthew Folks, on 04/02, patient declined SNF. Per patient provided permission for RN CM to discuss care with patient's wife, [name]. CM call to patient's wife, [name], phone: [phone] regarding discharge care plans. Per patient's wife, cannot afford to bring patient home. Per patient's wife, "food stamps won't come in on until the 9th and he eats up everything in the house. I don't have any food. I have slept for the past 2 nights." CM and patient's wife, discussed food intake now. Patient's wife, states "I ordered a pizza and I probably shouldn't have." CM call placed to [place], [facility], phone: [phone] regarding therapy services and contract SNFs. No answer, CM left message for return call.    Barriers to Discharge: Continued Medical Work up  Expected Discharge Plan and Services In-house Referral: Clinical Social Work     Living arrangements for the past 2 months: Single Family Home                    Social Determinants of Health (SDOH) Interventions SDOH Screenings   Food Insecurity: Food Insecurity Present (08/22/2023)  Housing: Low Risk  (08/22/2023)  Transportation Needs: No Transportation Needs (08/22/2023)  Utilities: At Risk (08/22/2023)  Tobacco Use: Medium Risk (08/21/2023)    Readmission Risk Interventions    04/19/2023   10:14 AM  Readmission Risk Prevention Plan  Transportation Screening Complete  PCP or Specialist Appt within 5-7 Days Complete  Home Care Screening Complete  Medication Review (RN CM) Referral to Pharmacy

## 2023-08-24 NOTE — Progress Notes (Signed)
   08/24/23 1058  Mobility  Activity Ambulated with assistance in hallway  Level of Assistance Minimal assist, patient does 75% or more  Assistive Device Front wheel walker  Distance Ambulated (ft) 75 ft  Activity Response Tolerated well  Mobility Referral Yes  Mobility visit 1 Mobility  Mobility Specialist Start Time (ACUTE ONLY) 1043  Mobility Specialist Stop Time (ACUTE ONLY) 1058  Mobility Specialist Time Calculation (min) (ACUTE ONLY) 15 min   Mobility Specialist: Progress Note  Pre-Mobility:      HR 91, SpO2 100% RA Post-Mobility:    HR 91, SpO2 91% RA  Pt agreeable to mobility session - received in bed. Pt was asymptomatic throughout session with no complaints. Returned to sitting upright in bed with all needs met - call bell within reach. Bed alarm on.   Barnie Mort, BS Mobility Specialist Please contact via SecureChat or  Rehab office at [phone].

## 2023-08-25 ENCOUNTER — Other Ambulatory Visit (HOSPITAL_COMMUNITY): Payer: Self-pay

## 2023-08-25 DIAGNOSIS — K7682 Hepatic encephalopathy: Secondary | ICD-10-CM | POA: Diagnosis not present

## 2023-08-25 LAB — COMPREHENSIVE METABOLIC PANEL WITH GFR
ALT: 40 U/L (ref 0–44)
AST: 95 U/L — ABNORMAL HIGH (ref 15–41)
Albumin: 2 g/dL — ABNORMAL LOW (ref 3.5–5.0)
Alkaline Phosphatase: 116 U/L (ref 38–126)
Anion gap: 9 (ref 5–15)
BUN: 8 mg/dL (ref 6–20)
CO2: 22 mmol/L (ref 22–32)
Calcium: 7.8 mg/dL — ABNORMAL LOW (ref 8.9–10.3)
Chloride: 105 mmol/L (ref 98–111)
Creatinine, Ser: 0.78 mg/dL (ref 0.61–1.24)
GFR, Estimated: >60 mL/min (ref >60)
Glucose, Bld: 276 mg/dL — ABNORMAL HIGH (ref 70–99)
Potassium: 3.8 mmol/L (ref 3.5–5.1)
Sodium: 136 mmol/L (ref 135–145)
Total Bilirubin: 1.4 mg/dL — ABNORMAL HIGH (ref 0.0–1.2)
Total Protein: 5.2 g/dL — ABNORMAL LOW (ref 6.5–8.1)

## 2023-08-25 LAB — PHOSPHORUS: Phosphorus: 2.9 mg/dL (ref 2.5–4.6)

## 2023-08-25 LAB — CBC
HCT: 38.5 % — ABNORMAL LOW (ref 39.0–52.0)
Hemoglobin: 12.9 g/dL — ABNORMAL LOW (ref 13.0–17.0)
MCH: 27.7 pg (ref 26.0–34.0)
MCHC: 33.5 g/dL (ref 30.0–36.0)
MCV: 82.8 fL (ref 80.0–100.0)
Platelets: 94 10*3/uL — ABNORMAL LOW (ref 150–400)
RBC: 4.65 MIL/uL (ref 4.22–5.81)
RDW: 17.4 % — ABNORMAL HIGH (ref 11.5–15.5)
WBC: 6.3 10*3/uL (ref 4.0–10.5)
nRBC: 0 % (ref 0.0–0.2)

## 2023-08-25 LAB — GLUCOSE, CAPILLARY
Glucose-Capillary: 227 mg/dL — ABNORMAL HIGH (ref 70–99)
Glucose-Capillary: 211 mg/dL — ABNORMAL HIGH (ref 70–99)
Glucose-Capillary: 267 mg/dL — ABNORMAL HIGH (ref 70–99)
Glucose-Capillary: 316 mg/dL — ABNORMAL HIGH (ref 70–99)
Glucose-Capillary: 288 mg/dL — ABNORMAL HIGH (ref 70–99)
Glucose-Capillary: 234 mg/dL — ABNORMAL HIGH (ref 70–99)

## 2023-08-25 LAB — AMMONIA: Ammonia: 33 umol/L (ref 9–35)

## 2023-08-25 MED ORDER — GLIPIZIDE 5 MG PO TABS
2.5000 mg | ORAL_TABLET | Freq: Two times a day (BID) | ORAL | Status: DC
  Administered 2023-08-25 – 2023-08-26 (×2): 2.5 mg via ORAL
  Filled 2023-08-25: qty 0.5
  Filled 2023-08-25: qty 1
  Filled 2023-08-25: qty 0.5

## 2023-08-25 MED ORDER — CARVEDILOL 3.125 MG PO TABS
3.1250 mg | ORAL_TABLET | Freq: Two times a day (BID) | ORAL | Status: DC
  Administered 2023-08-25 – 2023-08-26 (×3): 3.125 mg via ORAL
  Filled 2023-08-25 (×3): qty 1

## 2023-08-25 MED ORDER — POLYVINYL ALCOHOL 1.4 % OP SOLN
1.0000 [drp] | OPHTHALMIC | Status: DC | PRN
  Administered 2023-08-25: 1 [drp] via OPHTHALMIC
  Filled 2023-08-25 (×2): qty 15

## 2023-08-25 NOTE — Progress Notes (Signed)
 PROGRESS NOTE    Connor Vaughn  AOZ:[id number] DOB: 12/09/1960 DOA: 08/21/2023 PCP: Ricky Stabs, NP-C    Chief Complaint  Patient presents with   Altered Mental Status    Brief Narrative:    pt admitted  for altered mental status, known history of alcoholic cirrhosis s/p TIPS, history of NSVT s/p ICD, CKD 3a, ongoing alcohol use, as well as polysubstance use presented due to worsening altered mental status. Patient continues to drink about 8 beers per day and noted to be cocaine positive on tox screen.  Admitted for further management.    Assessment & Plan:   Principal Problem:   Hepatic encephalopathy (HCC) Active Problems:   Ketoacidosis   Lactic acidosis   Decompensated cirrhosis (HCC)   Polysubstance use disorder   Elevated lactic acid level   History of alcohol abuse   Malnutrition of moderate degree    Acute metabolic encephalopathy Acute hepatic encephalopathy Acute toxic encephalopathy This is multifactorial, in the setting of medication noncompliance, hyperammonemia, cocaine and alcohol abuse.  With a euglycemic ketoacidosis -Mentation improving, continue with lactulose. - Mentation back to baseline   Likely decompensated cirrhosis Elevated ammonia S/p TIPS in 2024 Thrombocytopenia Continue with lactulose, started on rifaximin per GI as well, continue with Rocephin Her ammonia closely -Started back on Coreg   Possible aspiration PNA Continue IV ceftriaxone, Flagyl   Euglycemic DKA In the setting of alcohol use, poor oral intake, SGLT2 Continue insulin drip until bicarb has normalized Continue to hold SGLT2 inhibitor at time of discharge due to potential for euglycemic DKA BMP   Lactic acidosis Resolved with IV fluids  Anion gap metabolic acidosis -Most likely combination between lactic acidosis and has euglycemic DKA, bicarb is low today at 16, will start on p.o. bicarb  Polysubstance abuse Alcohol abuse Cocaine abuse -Continue  with CIWA protocol -Denies any history of cocaine use!!  Hypertrophic cardiomyopathy, bicuspid aortic valve, moderate AS: Clinically hypovolemic on presentation.  Consider repeat TTE to eval for changes in AS.   History of NSVT / HCM, s/p AICD: Noted  Hypertension: Hold to some Coreg in setting of his acute illness  CKD stage IIIa: Per chart review, ?  Creatinine may underestimate due to sarcopenia.  Baseline creatinine near 0.8 with EGFR more so in CKD 2 range  History anemia: Not an active problem hemoglobin 12  History thrombocytopenia:   Continue to monitor Mood d/o: Holding his home duloxetine, BuSpar for now   DVT prophylaxis: SCD Code Status: DNR Family Communication: None at bedside Disposition:   Status is: Inpatient    Consultants:  GI   Subjective:  Mentation back to baseline, patient with multiple BMs yesterday,  Objective: Vitals:   08/25/23 0400 08/25/23 0800 08/25/23 1130 08/25/23 1529  BP: 128/79 135/75 115/79 136/81  Pulse: 93 89  87  Resp: 18 14 14 14   Temp: (!) 97.5 F (36.4 C) (!) 97.5 F (36.4 C) (!) 97.4 F (36.3 C) 97.6 F (36.4 C)  TempSrc: Oral Oral Oral Oral  SpO2: 100% 99%  100%  Weight:        Intake/Output Summary (Last 24 hours) at 08/25/2023 1636 Last data filed at 08/25/2023 1633 Gross per 24 hour  Intake 1443 ml  Output 1000 ml  Net 443 ml    Filed Weights   08/21/23 2025  Weight: 67.9 kg    Examination:  Awake Alert, Oriented X 3, No new F.N deficits, Normal affect Symmetrical Chest wall movement, Good air movement bilaterally, CTAB  RRR,No Gallops,Rubs or new Murmurs, No Parasternal Heave +ve B.Sounds, Abd Soft, No tenderness, No rebound - guarding or rigidity. No Cyanosis, Clubbing or edema, No new Rash or bruise       Data Reviewed: I have personally reviewed following labs and imaging studies  CBC: Recent Labs  Lab 08/21/23 2031 08/21/23 2042 08/22/23 0422 08/23/23 0230 08/24/23 0518 08/25/23 0539   WBC 5.8  --  9.1 9.3 6.1 6.3  NEUTROABS 3.1  --   --  4.7  --   --   HGB 12.9* 12.2* 12.1* 13.9 11.7* 12.9*  HCT 38.4* 36.0* 36.1* 42.0 34.5* 38.5*  MCV 80.7  --  81.1 82.7 81.8 82.8  PLT 127*  --  136* 144* 109* 94*    Basic Metabolic Panel: Recent Labs  Lab 08/22/23 0422 08/22/23 0848 08/23/23 0006 08/23/23 1053 08/23/23 2113 08/24/23 0518 08/25/23 0911  NA 141   < > 144 145 138 139 136  K 3.6   < > 3.6 4.0 3.9 3.1* 3.8  CL 111   < > 116* 113* 110 107 105  CO2 16*   < > 16* 16* 19* 16* 22  GLUCOSE 124*   < > 151* 131* 240* 138* 276*  BUN 14   < > 9 8 7 8 8   CREATININE 0.93   < > 0.94 0.94 1.02 0.77 0.78  CALCIUM 8.4*   < > 8.5* 8.9 8.5* 8.1* 7.8*  MG 1.8  --   --   --   --   --   --   PHOS 2.7  --   --   --   --  3.6 2.9   < > = values in this interval not displayed.    GFR: Estimated Creatinine Clearance: 88.6 mL/min (by C-G formula based on SCr of 0.78 mg/dL).  Liver Function Tests: Recent Labs  Lab 08/21/23 2031 08/22/23 0422 08/23/23 1053 08/24/23 0518 08/25/23 0911  AST 41 40 161* 101* 95*  ALT 27 25 53* 42 40  ALKPHOS 146* 129* 163* 116 116  BILITOT 2.2* 2.0* 2.2* 1.5* 1.4*  PROT 6.1* 5.6* 6.9 5.4* 5.2*  ALBUMIN 2.4* 2.3* 2.7* 2.1* 2.0*    CBG: Recent Labs  Lab 08/24/23 2344 08/25/23 0423 08/25/23 0736 08/25/23 1129 08/25/23 1628  GLUCAP 214* 227* 211* 267* 316*     Recent Results (from the past 240 hours)  Blood culture (routine x 2)     Status: None (Preliminary result)   Collection Time: 08/21/23  8:36 PM   Specimen: BLOOD  Result Value Ref Range Status   Specimen Description BLOOD SITE NOT SPECIFIED  Final   Special Requests   Final    BOTTLES DRAWN AEROBIC AND ANAEROBIC Blood Culture adequate volume   Culture   Final    NO GROWTH 4 DAYS Performed at [facility], [address], [place], [place] [address]    Report Status PENDING  Incomplete  Blood culture (routine x 2)     Status: None (Preliminary result)   Collection  Time: 08/21/23  9:22 PM   Specimen: BLOOD RIGHT HAND  Result Value Ref Range Status   Specimen Description BLOOD RIGHT HAND  Final   Special Requests   Final    BOTTLES DRAWN AEROBIC AND ANAEROBIC Blood Culture adequate volume   Culture   Final    NO GROWTH 4 DAYS Performed at [facility], [address] [place], [place] [address]    Report Status  PENDING  Incomplete         Radiology Studies: No results found.       Scheduled Meds:  carvedilol  3.125 mg Oral BID WC   feeding supplement  237 mL Oral BID BM   folic acid  1 mg Oral Daily   insulin aspart  0-6 Units Subcutaneous Q4H   insulin glargine-yfgn  10 Units Subcutaneous QHS   lactulose  30 g Oral TID   LORazepam  0-4 mg Intravenous Q12H   multivitamin with minerals  1 tablet Oral Daily   rifaximin  550 mg Oral BID   sodium bicarbonate  650 mg Oral TID   sodium chloride flush  3 mL Intravenous Q12H   Continuous Infusions:  cefTRIAXone (ROCEPHIN)  IV 2 g (08/24/23 2304)   metronidazole 500 mg (08/25/23 0830)   thiamine (VITAMIN B1) injection 500 mg (08/25/23 1403)     LOS: 3 days      Huey Bienenstock, MD [facility]   To contact the attending provider between 7A-7P or the covering provider during after hours 7P-7A, please log into the web site [email or web address] and access using universal [place] password for that web site. If you do not have the password, please call the hospital operator.  08/25/2023, 4:36 PM

## 2023-08-25 NOTE — Plan of Care (Signed)

## 2023-08-25 NOTE — TOC Progression Note (Signed)
 Transition of Care [facility]) - Progression Note    Patient Details  Name: Connor Vaughn MRN: [id number] Date of Birth: [birth year]  [facility]) CM/SW Contact  Mearl Latin, LCSW Phone Number: 08/25/2023, 1:56 PM  Clinical Narrative:    CSW received return call from [place] with [facility] who stated that she spoke with the patient who does want to return home. She is going to increase his days at the Center. She has provided food resources to patient's spouse. Patient reports his daughter is going to pick him up tomorrow ([facility] does not transport on weekends). TOC to fax patient's discharge summary to f. [phone].     Barriers to Discharge: Continued Medical Work up  Expected Discharge Plan and Services In-house Referral: Clinical Social Work     Living arrangements for the past 2 months: Single Family Home                                       Social Determinants of Health (SDOH) Interventions SDOH Screenings   Food Insecurity: Food Insecurity Present (08/22/2023)  Housing: Low Risk  (08/22/2023)  Transportation Needs: No Transportation Needs (08/22/2023)  Utilities: At Risk (08/22/2023)  Tobacco Use: Medium Risk (08/21/2023)    Readmission Risk Interventions    04/19/2023   10:14 AM  Readmission Risk Prevention Plan  Transportation Screening Complete  PCP or Specialist Appt within 5-7 Days Complete  Home Care Screening Complete  Medication Review (RN CM) Referral to Pharmacy

## 2023-08-25 NOTE — Inpatient Diabetes Management (Signed)
 Inpatient Diabetes Program Recommendations  AACE/ADA: New Consensus Statement on Inpatient Glycemic Control (2015)  Target Ranges:  Prepandial:   less than 140 mg/dL      Peak postprandial:   less than 180 mg/dL (1-2 hours)      Critically ill patients:  140 - 180 mg/dL   Lab Results  Component Value Date   GLUCAP 267 (H) 08/25/2023   HGBA1C 10.0 (H) 08/22/2023    Review of Glycemic Control  Diabetes history: type 2 Outpatient Diabetes medications: Gipizide 10 mg BID (per patient) for 3-4 years Current orders for Inpatient glycemic control: Semglee 10 units at HS, Novolog 0-6 units correction scale every 4 hours  Inpatient Diabetes Program Recommendations:   Spoke with patient about his diabetes. States that he was diagnosed about 7 years ago. States that he has been taking Glipizide for the last 3-4 years. He did not know anything about taking [place] or Januvia. He has a PCP that follows his diabetes. States that many of his relatives had diabetes.   Discussed HgbA1C of 10% and what it means for his blood sugars. He states that his blood sugars can run close to 240 mg/dl. He states that he has been checking blood sugars in the morning and then 2 hours after a meal. Suggested that he check at least twice per day in am and then before dinner. He will be following up with PCP and heart physicians.  Smith Mince RN BSN CDE Diabetes Coordinator Pager: [phone]  8am-5pm

## 2023-08-25 NOTE — Care Management Important Message (Signed)
 Important Message  Patient Details  Name: Connor Vaughn MRN: [id number] Date of Birth: Sep 02, 1960   Important Message Given:  Yes - Medicare IM     Dorena Bodo 08/25/2023, 3:05 PM

## 2023-08-25 NOTE — Progress Notes (Signed)
 Physical Therapy Treatment Patient Details Name: ABDULKARIM Vaughn MRN: [id number] DOB: [birth year] Today's Date: 08/25/2023   History of Present Illness Pt is a 63 y/o M admitted on 08/21/23 after presenting with c/o AMS that began ~1 week prior. Pt is being treated for encephalopathy, suspect multifactorial in the setting of hepatic encephalopathy, ongoing substance use. PMH: alcoholic cirrhosis s/p TIPS 11/24, hypertrophic cardiomyopathy, bicuspid aortic valve with moderate AS, NSVT s/p ICD, HTN, DM, CKD3A, anemia, thrombocytopenia, depression, ongoing alcohol use & apparent polysubstance use, Hep C, L3-S1 Laminectomy & L4-5 discectomy 7/21    PT Comments  Patient progressing with ambulation managing to maneuver around obstacles with cues only on two occasions only.  Able to negotiate stairs appropraite for home entry with CGA/min A with rail.  Feel stable for home with follow up at [facility].  PT will follow up if not d/c.     If plan is discharge home, recommend the following: A little help with bathing/dressing/bathroom;A little help with walking and/or transfers;Assistance with cooking/housework;Assist for transportation;Help with stairs or ramp for entrance;Supervision due to cognitive status   Can travel by private vehicle     Yes  Equipment Recommendations  Rolling walker (2 wheels)    Recommendations for Other Services       Precautions / Restrictions Precautions Precautions: Fall Recall of Precautions/Restrictions: Intact Restrictions Weight Bearing Restrictions Per Provider Order: Yes     Mobility  Bed Mobility               General bed mobility comments: in recliner    Transfers   Equipment used: Rolling walker (2 wheels) Transfers: Sit to/from Stand Sit to Stand: Supervision           General transfer comment: cues for using walker to get to chair; pt wanting to park it    Ambulation/Gait   Gait Distance (Feet): 260 Feet Assistive  device: Rolling walker (2 wheels) Gait Pattern/deviations: Step-through pattern, Decreased stride length, Trunk flexed       General Gait Details: trunk flexed at times, occasional cues for obstacle negotiation   Stairs Stairs: Yes Stairs assistance: Min assist, Contact guard assist Stair Management: One rail Right, Step to pattern Number of Stairs: 3 General stair comments: ascending with R hand on rail and L hand holding therapist's hand, descending with both hands to R rail   Wheelchair Mobility     Tilt Bed    Modified Rankin (Stroke Patients Only)       Balance Overall balance assessment: Needs assistance   Sitting balance-Leahy Scale: Good Sitting balance - Comments: leaning to doff and don socks seated edge of recliner     Standing balance-Leahy Scale: Fair Standing balance comment: can stand static no UE support               High Level Balance Comments: side stepping at rail in hallway wtih bilat UE support with cues, forward march and backward walking 1 Hand on rail            Communication    Cognition Arousal: Alert Behavior During Therapy: WFL for tasks assessed/performed   PT - Cognitive impairments: No family/caregiver present to determine baseline, Orientation, Awareness, Memory, Attention, Initiation, Sequencing, Problem solving, Safety/Judgement                                Cueing    Exercises      General Comments General  comments (skin integrity, edema, etc.): VSS on RA      Pertinent Vitals/Pain Pain Assessment Pain Assessment: No/denies pain    Home Living                          Prior Function            PT Goals (current goals can now be found in the care plan section) Progress towards PT goals: Progressing toward goals    Frequency           PT Plan      Co-evaluation              AM-PAC PT "6 Clicks" Mobility   Outcome Measure  Help needed turning from your back to  your side while in a flat bed without using bedrails?: None Help needed moving from lying on your back to sitting on the side of a flat bed without using bedrails?: None Help needed moving to and from a bed to a chair (including a wheelchair)?: None Help needed standing up from a chair using your arms (e.g., wheelchair or bedside chair)?: A Little Help needed to walk in hospital room?: A Little Help needed climbing 3-5 steps with a railing? : A Little 6 Click Score: 21    End of Session Equipment Utilized During Treatment: Gait belt Activity Tolerance: Patient tolerated treatment well Patient left: in chair;with call bell/phone within reach   PT Visit Diagnosis: Unsteadiness on feet (R26.81);Muscle weakness (generalized) (M62.81);Other abnormalities of gait and mobility (R26.89)     Time: 1250-1316 PT Time Calculation (min) (ACUTE ONLY): 26 min  Charges:    $Gait Training: 8-22 mins $Therapeutic Activity: 8-22 mins PT General Charges $$ ACUTE PT VISIT: 1 Visit                     Sheran Lawless, PT Acute Rehabilitation Services [phone] 08/25/2023    Connor Vaughn 08/25/2023, 2:44 PM

## 2023-08-25 NOTE — TOC Progression Note (Addendum)
 Transition of Care [facility]) - Progression Note    Patient Details  Name: PEYTEN WEARE MRN: [id number] Date of Birth: 1960-12-29  [facility]) CM/SW Contact  Alesia Richards, RN 08/25/2023, 2:02 PM  Clinical Narrative:     CM call to patient's daughter, Joice Lofts, phone: [phone] regarding confirmation of transportation for pending discharge on tomorrow. No answer, CM left message for return call. Per Matthew Folks, per [place], [facility], will provide food resources and will increase services at [facility]. Call received from patient's daughter, Joice Lofts, confirmed transportation for discharge tomorrow at 1300. CM alert to Dr. Randol Kern regarding confirmed transportation and time via patient's daughter, Hospital doctor.     Barriers to Discharge: Continued Medical Work up  Expected Discharge Plan and Services In-house Referral: Clinical Social Work     Living arrangements for the past 2 months: Single Family Home                   Social Determinants of Health (SDOH) Interventions SDOH Screenings   Food Insecurity: Food Insecurity Present (08/22/2023)  Housing: Low Risk  (08/22/2023)  Transportation Needs: No Transportation Needs (08/22/2023)  Utilities: At Risk (08/22/2023)  Tobacco Use: Medium Risk (08/21/2023)    Readmission Risk Interventions    04/19/2023   10:14 AM  Readmission Risk Prevention Plan  Transportation Screening Complete  PCP or Specialist Appt within 5-7 Days Complete  Home Care Screening Complete  Medication Review (RN CM) Referral to Pharmacy

## 2023-08-25 NOTE — Progress Notes (Signed)
 Occupational Therapy Treatment Patient Details Name: Connor Vaughn MRN: [id number] DOB: Nov 25, 1960 Today's Date: 08/25/2023   History of present illness Pt is a 63 y/o M admitted on 08/21/23 after presenting with c/o AMS that began ~1 week prior. Pt is being treated for encephalopathy, suspect multifactorial in the setting of hepatic encephalopathy, ongoing substance use. PMH: alcoholic cirrhosis s/p TIPS 11/24, hypertrophic cardiomyopathy, bicuspid aortic valve with moderate AS, NSVT s/p ICD, HTN, DM, CKD3A, anemia, thrombocytopenia, depression, ongoing alcohol use & apparent polysubstance use, Hep C, L3-S1 Laminectomy & L4-5 discectomy 7/21   OT comments  Patient with significant improvement with adl status from evaluation.  He is supervision for adls and needs a little extra time.  Do not feel he needs any further OT post acute.  Will continue to see while here to facilitate d/c      If plan is discharge home, recommend the following:  Help with stairs or ramp for entrance;A little help with bathing/dressing/bathroom   Equipment Recommendations  None recommended by OT    Recommendations for Other Services      Precautions / Restrictions Precautions Precautions: Fall Recall of Precautions/Restrictions: Intact Restrictions Weight Bearing Restrictions Per Provider Order: Yes       Mobility Bed Mobility               General bed mobility comments: In recliner upon arrival    Transfers Overall transfer level: Needs assistance Equipment used: Rolling walker (2 wheels) Transfers: Sit to/from Stand Sit to Stand: Supervision                 Balance Overall balance assessment: Needs assistance Sitting-balance support: Feet supported, No upper extremity supported Sitting balance-Leahy Scale: Good                         High Level Balance Comments: Patient able to reach out of BOS in standing, turn 360 degrees both ways and stand with feet together EO/EC  for 30 seconds.  needed [facility] suport for single leg stance on both sides           ADL either performed or assessed with clinical judgement   ADL Overall ADL's : Needs assistance/impaired Eating/Feeding: Independent   Grooming: Wash/dry hands;Wash/dry face;Supervision/safety   Upper Body Bathing: Supervision/ safety   Lower Body Bathing: Supervison/ safety   Upper Body Dressing : Supervision/safety   Lower Body Dressing: Supervision/safety   Toilet Transfer: Supervision/safety           Functional mobility during ADLs: Supervision/safety General ADL Comments: Patient dressed sitting in chair and had just used the urinal independently.    Extremity/Trunk Assessment Upper Extremity Assessment Upper Extremity Assessment: Overall WFL for tasks assessed            Vision       Perception     Praxis     Communication Communication Communication: No apparent difficulties   Cognition Arousal: Alert   Cognition: No apparent impairments             OT - Cognition Comments: Able to prive good detail with home situation and plans when he gets home                 Following commands: Intact Following commands impaired: Only follows one step commands consistently      Cueing   Cueing Techniques: Verbal cues  Exercises      Shoulder Instructions  General Comments bruising on BUE    Pertinent Vitals/ Pain       Pain Assessment Pain Assessment: No/denies pain  Home Living                                          Prior Functioning/Environment              Frequency  Min 2X/week        Progress Toward Goals  OT Goals(current goals can now be found in the care plan section)     Acute Rehab OT Goals Time For Goal Achievement: 09/06/23 Potential to Achieve Goals: Good ADL Goals Pt Will Perform Upper Body Dressing: with min assist Pt Will Perform Lower Body Dressing: with min assist Pt Will Transfer to  Toilet: with min assist Pt Will Perform Toileting - Clothing Manipulation and hygiene: with min assist  Plan      Co-evaluation                 AM-PAC OT "6 Clicks" Daily Activity     Outcome Measure   Help from another person eating meals?: None Help from another person taking care of personal grooming?: None Help from another person toileting, which includes using toliet, bedpan, or urinal?: None Help from another person bathing (including washing, rinsing, drying)?: A Little Help from another person to put on and taking off regular upper body clothing?: A Little Help from another person to put on and taking off regular lower body clothing?: A Little 6 Click Score: 21    End of Session    OT Visit Diagnosis: Unsteadiness on feet (R26.81);Other abnormalities of gait and mobility (R26.89);Muscle weakness (generalized) (M62.81)   Activity Tolerance Patient tolerated treatment well   Patient Left in chair;with call bell/phone within reach   Nurse Communication Mobility status        Time: 5409-8119 OT Time Calculation (min): 18 min  Charges: OT General Charges $OT Visit: 1 Visit OT Treatments $Self Care/Home Management : 8-22 mins  Hal Neer OTR/L   Malachi Bonds 08/25/2023, 4:09 PM

## 2023-08-26 ENCOUNTER — Other Ambulatory Visit (HOSPITAL_COMMUNITY): Payer: Self-pay

## 2023-08-26 DIAGNOSIS — K7682 Hepatic encephalopathy: Secondary | ICD-10-CM | POA: Diagnosis not present

## 2023-08-26 LAB — CBC
HCT: 30.1 % — ABNORMAL LOW (ref 39.0–52.0)
Hemoglobin: 10.4 g/dL — ABNORMAL LOW (ref 13.0–17.0)
MCH: 27.7 pg (ref 26.0–34.0)
MCHC: 34.6 g/dL (ref 30.0–36.0)
MCV: 80.3 fL (ref 80.0–100.0)
Platelets: 79 10*3/uL — ABNORMAL LOW (ref 150–400)
RBC: 3.75 MIL/uL — ABNORMAL LOW (ref 4.22–5.81)
RDW: 16.7 % — ABNORMAL HIGH (ref 11.5–15.5)
WBC: 5.5 10*3/uL (ref 4.0–10.5)
nRBC: 0 % (ref 0.0–0.2)

## 2023-08-26 LAB — COMPREHENSIVE METABOLIC PANEL WITH GFR
ALT: 33 U/L (ref 0–44)
AST: 69 U/L — ABNORMAL HIGH (ref 15–41)
Albumin: 1.7 g/dL — ABNORMAL LOW (ref 3.5–5.0)
Alkaline Phosphatase: 103 U/L (ref 38–126)
Anion gap: 6 (ref 5–15)
BUN: 10 mg/dL (ref 6–20)
CO2: 23 mmol/L (ref 22–32)
Calcium: 7.7 mg/dL — ABNORMAL LOW (ref 8.9–10.3)
Chloride: 104 mmol/L (ref 98–111)
Creatinine, Ser: 0.69 mg/dL (ref 0.61–1.24)
GFR, Estimated: >60 mL/min (ref >60)
Glucose, Bld: 131 mg/dL — ABNORMAL HIGH (ref 70–99)
Potassium: 3.4 mmol/L — ABNORMAL LOW (ref 3.5–5.1)
Sodium: 133 mmol/L — ABNORMAL LOW (ref 135–145)
Total Bilirubin: 1 mg/dL (ref 0.0–1.2)
Total Protein: 4.4 g/dL — ABNORMAL LOW (ref 6.5–8.1)

## 2023-08-26 LAB — GLUCOSE, CAPILLARY
Glucose-Capillary: 93 mg/dL (ref 70–99)
Glucose-Capillary: 248 mg/dL — ABNORMAL HIGH (ref 70–99)

## 2023-08-26 LAB — CULTURE, BLOOD (ROUTINE X 2)
Culture: NO GROWTH
Culture: NO GROWTH
Special Requests: ADEQUATE
Special Requests: ADEQUATE

## 2023-08-26 LAB — AMMONIA: Ammonia: 33 umol/L (ref 9–35)

## 2023-08-26 LAB — PHOSPHORUS: Phosphorus: 3 mg/dL (ref 2.5–4.6)

## 2023-08-26 MED ORDER — POTASSIUM CHLORIDE CRYS ER 20 MEQ PO TBCR
40.0000 meq | EXTENDED_RELEASE_TABLET | ORAL | Status: DC
Start: 2023-08-26 — End: 2023-08-26
  Administered 2023-08-26: 40 meq via ORAL
  Filled 2023-08-26: qty 2

## 2023-08-26 MED ORDER — THIAMINE HCL 100 MG PO TABS
100.0000 mg | ORAL_TABLET | Freq: Every day | ORAL | 0 refills | Status: DC
  Filled 2023-08-26: qty 30, 30d supply, fill #0

## 2023-08-26 MED ORDER — SPIRONOLACTONE 25 MG PO TABS
12.5000 mg | ORAL_TABLET | Freq: Every day | ORAL | 0 refills | Status: DC
  Filled 2023-08-26: qty 30, 60d supply, fill #0

## 2023-08-26 MED ORDER — CARVEDILOL 3.125 MG PO TABS
3.1250 mg | ORAL_TABLET | Freq: Two times a day (BID) | ORAL | 0 refills | Status: DC
  Filled 2023-08-26: qty 30, 15d supply, fill #0

## 2023-08-26 MED ORDER — LACTULOSE 10 GM/15ML PO SOLN
30.0000 g | Freq: Three times a day (TID) | ORAL | 0 refills | Status: DC
  Filled 2023-08-26: qty 946, 7d supply, fill #0

## 2023-08-26 MED ORDER — GLIPIZIDE 5 MG PO TABS
5.0000 mg | ORAL_TABLET | Freq: Two times a day (BID) | ORAL | 0 refills | Status: DC
  Filled 2023-08-26: qty 60, 30d supply, fill #0

## 2023-08-26 MED ORDER — FOLIC ACID 1 MG PO TABS
1.0000 mg | ORAL_TABLET | Freq: Every day | ORAL | 0 refills | Status: DC
  Filled 2023-08-26: qty 30, 30d supply, fill #0

## 2023-08-26 MED ORDER — ADULT MULTIVITAMIN W/MINERALS CH
1.0000 | ORAL_TABLET | Freq: Every day | ORAL | 0 refills | Status: DC
  Filled 2023-08-26: qty 30, 30d supply, fill #0

## 2023-08-26 NOTE — Plan of Care (Signed)

## 2023-08-26 NOTE — Progress Notes (Signed)
   08/26/23 0945  Mobility  Activity Ambulated with assistance in room  Level of Assistance Contact guard assist, steadying assist  Assistive Device Front wheel walker  Distance Ambulated (ft) 15 ft  Activity Response Tolerated well  Mobility Referral Yes  Mobility visit 1 Mobility  Mobility Specialist Start Time (ACUTE ONLY) 0931  Mobility Specialist Stop Time (ACUTE ONLY) 0945  Mobility Specialist Time Calculation (min) (ACUTE ONLY) 14 min   Mobility Specialist: Progress Note  Post-Mobility:    HR 92, SpO2 96% RA  Pt agreeable to mobility session - received in bed. Pt was asymptomatic throughout session with no complaints. Further mobility deferred by pt d/t possible d/c today. Returned to chair with all needs met - call bell within reach. Chair alarm on.   Barnie Mort, BS Mobility Specialist Please contact via SecureChat or  Rehab office at [phone].

## 2023-08-26 NOTE — Progress Notes (Signed)
 There was a consult for placing a PIV access. According to patient's RN, patient is going home today. Patient asked why he need a PIV access. Secure chat with Dr. Randol Kern regarding this matter and Dr. Randol Kern said that no need IV access. Informed patient and patient's RN. HS [employer]

## 2023-08-26 NOTE — Discharge Summary (Signed)
 Physician Discharge Summary  Connor Vaughn JWJ:[id number] DOB: 1960/10/07 DOA: 08/21/2023  PCP: Ricky Stabs, NP-C  Admit date: 08/21/2023 Discharge date: 08/26/2023  Admitted From: (Home) Disposition:  (Home)  Recommendations for Outpatient Follow-up:  Follow up with PCP in 1-2 weeks Please obtain CBC, CMP in 1 week Patient started on lactulose for hyperammonemia, adjust dose as needed to target up to 3 BMs per day Adjust diuretics according to volume status, monitor labs closely Please continue counseling about alcohol abuse, and possible substance abuse Monitor blood glucose and adjust medication as needed, please see discussion below regarding medication changes.   Diet recommendation: Heart Healthy / Carb Modified   Brief/Interim Summary:   pt admitted  for altered mental status, known history of alcoholic cirrhosis s/p TIPS, history of NSVT s/p ICD, CKD 3a, ongoing alcohol use, as well as polysubstance use presented due to worsening altered mental status. Patient continues to drink about 8 beers per day and noted to be cocaine positive on tox screen.  Admitted for further management.    Acute metabolic encephalopathy Acute hepatic encephalopathy Acute toxic encephalopathy This is multifactorial, in the setting of medication noncompliance, hyperammonemia, cocaine and alcohol abuse.  With a euglycemic ketoacidosis - Mentation back to baseline   decompensated cirrhosis Hyperammonemia S/p TIPS in 2024 Thrombocytopenia With known history of liver cirrhosis s/p TIPS. - Ammonia level significantly elevated to 54 at time of discharge, he was started on lactulose and dose was uptitrated, his ammonia level is 33 today, mentation back to baseline, continue with lactulose at time of discharge, he was started on Xifaxan as well, but co-pay cost is prohibitive, he was provided with lactulose supplements at time of discharge, monitor ammonia level closely . - He is back on Coreg,  dose was lowered given soft blood pressure -Resume Lasix on discharge, will add low-dose Aldactone as well especially with recurrent hypokalemia.   Possible aspiration PNA Treated with IV ceftriaxone, Flagyl   Euglycemic DKA In the setting of alcohol use, poor oral intake, SGLT2 use Quired insulin drip and bicarb initially,  SGLT2 has been discontinued on discharge given concern for hyperglycemic DKA . Sinew with Januvia, started on glipizide as well     Lactic acidosis Resolved with IV fluids   Anion gap metabolic acidosis -Most likely combination between lactic acidosis and has euglycemic DKA, bicarb is low today at 16, he was started on oral bicarb during hospital stay, bicarb level within normal limit on discharge, so we will hold on prescribing on discharge.     Polysubstance abuse Alcohol abuse Cocaine abuse - He was on CIWA protocol during hospital stay, he has no withdrawals at time of discharge, continue with thiamine, folic acid, he was counseled. - Drug screen was positive for cocaine, but he denies any history of cocaine use!!   Hypertrophic cardiomyopathy, bicuspid aortic valve, moderate AS: Clinically hypovolemic on presentation.  By the time of discharge this has resolved, he started to develop some lower extremity edema show resumed on home dose Lasix and low-dose Aldactone was added, consider repeat TTE to eval for changes in AS.    History of NSVT / HCM, s/p AICD: Noted   Hypertension: Continue with Coreg, Lasix mainly in the setting of liver disease   CKD stage II  With diagnosis of CKD stage IIIa per chart review ?  Creatinine may underestimate due to sarcopenia.  Baseline creatinine near 0.8 with EGFR more so in CKD 2 range   Anemia -Normocytic, due to alcohol  abuse   History thrombocytopenia: -Due to liver cirrhosis from alcohol  Mood d/o: Continue home duloxetine, BuSpar for now     Discharge Diagnoses:  Principal Problem:   Hepatic encephalopathy  (HCC) Active Problems:   Ketoacidosis   Lactic acidosis   Decompensated cirrhosis (HCC)   Polysubstance use disorder   Elevated lactic acid level   History of alcohol abuse   Malnutrition of moderate degree    Discharge Instructions  Discharge Instructions     Diet - low sodium heart healthy   Complete by: As directed    Discharge instructions   Complete by: As directed    Follow with Primary MD Ricky Stabs, NP-C in 7 days   Get CBC, CMP, checked  by Primary MD next visit.    Activity: As tolerated with Full fall precautions use walker/cane & assistance as needed   Disposition Home    Diet: Heart Healthy /low-salt diet/carb modified   On your next visit with your primary care physician please Get Medicines reviewed and adjusted.   Please request your Prim.MD to go over all Hospital Tests and Procedure/Radiological results at the follow up, please get all Hospital records sent to your Prim MD by signing hospital release before you go home.   If you experience worsening of your admission symptoms, develop shortness of breath, life threatening emergency, suicidal or homicidal thoughts you must seek medical attention immediately by calling 911 or calling your MD immediately  if symptoms less severe.  You Must read complete instructions/literature along with all the possible adverse reactions/side effects for all the Medicines you take and that have been prescribed to you. Take any new Medicines after you have completely understood and accpet all the possible adverse reactions/side effects.   Do not drive, operating heavy machinery, perform activities at heights, swimming or participation in water activities or provide baby sitting services if your were admitted for syncope or siezures until you have seen by Primary MD or a Neurologist and advised to do so again.  Do not drive when taking Pain medications.    Do not take more than prescribed Pain, Sleep and  Anxiety Medications  Special Instructions: If you have smoked or chewed Tobacco  in the last 2 yrs please stop smoking, stop any regular Alcohol  and or any Recreational drug use.  Wear Seat belts while driving.   Please note  You were cared for by a hospitalist during your hospital stay. If you have any questions about your discharge medications or the care you received while you were in the hospital after you are discharged, you can call the unit and asked to speak with the hospitalist on call if the hospitalist that took care of you is not available. Once you are discharged, your primary care physician will handle any further medical issues. Please note that NO REFILLS for any discharge medications will be authorized once you are discharged, as it is imperative that you return to your primary care physician (or establish a relationship with a primary care physician if you do not have one) for your aftercare needs so that they can reassess your need for medications and monitor your lab values.   Increase activity slowly   Complete by: As directed       Allergies as of 08/26/2023       Reactions   Latex Hives, Rash        Medication List     STOP taking these medications    dapagliflozin  propanediol 10 MG Tabs tablet Commonly known as: FARXIGA       TAKE these medications    busPIRone 5 MG tablet Commonly known as: BUSPAR Take 5 mg by mouth 2 (two) times daily.   carvedilol 3.125 MG tablet Commonly known as: COREG Take 1 tablet (3.125 mg total) by mouth 2 (two) times daily with a meal. What changed:  medication strength how much to take when to take this   DULoxetine 30 MG capsule Commonly known as: CYMBALTA Take 30 mg by mouth daily.   DULoxetine 60 MG capsule Commonly known as: CYMBALTA Take 60 mg by mouth daily.   ferrous sulfate 325 (65 FE) MG EC tablet Take 325 mg by mouth daily with breakfast.   folic acid 1 MG tablet Commonly known as: FOLVITE Take 1  tablet (1 mg total) by mouth daily. Start taking on: August 27, 2023   furosemide 20 MG tablet Commonly known as: LASIX Take 1 tablet (20 mg total) by mouth daily.   glipiZIDE 5 MG tablet Commonly known as: GLUCOTROL Take 1 tablet (5 mg total) by mouth 2 (two) times daily before a meal.   lactulose 10 GM/15ML solution Commonly known as: CHRONULAC Take 45 mLs (30 g total) by mouth 3 (three) times daily.   montelukast 10 MG tablet Commonly known as: SINGULAIR Take 10 mg by mouth every evening.   multivitamin with minerals Tabs tablet Take 1 tablet by mouth daily. Start taking on: August 27, 2023   sitaGLIPtin 50 MG tablet Commonly known as: JANUVIA Take 50 mg by mouth daily.   spironolactone 25 MG tablet Commonly known as: Aldactone Take 0.5 tablets (12.5 mg total) by mouth daily.   thiamine 100 MG tablet Commonly known as: VITAMIN B1 Take 1 tablet (100 mg total) by mouth daily.               Durable Medical Equipment  (From admission, onward)           Start     Ordered   08/25/23 1416  For home use only DME Walker rolling  Once       Question Answer Comment  Walker: With 5 Inch Wheels   Patient needs a walker to treat with the following condition Generalized weakness      08/25/23 1419            Allergies  Allergen Reactions   Latex Hives and Rash    Consultations: Gastroenterology   Procedures/Studies: CUP PACEART REMOTE DEVICE CHECK Result Date: 08/23/2023 Scheduled remote reviewed. Normal device function.  Presenting rhythm:  AS/VS Next remote 91 days. LA, CVRS  DG Abd 1 View Result Date: 08/22/2023 CLINICAL DATA:  Confirm NG tube placement EXAM: ABDOMEN - 1 VIEW COMPARISON:  Same day abdominal radiographs FINDINGS: Enteric tube tip and side-port in the stomach.  TIPS. IMPRESSION: Enteric tube tip and side-port in the stomach. Electronically Signed   By: Minerva Fester M.D.   On: 08/22/2023 20:32   US LIVER DOPPLER Result Date:  08/22/2023 CLINICAL DATA:  [id number] S/P TIPS (transjugular intrahepatic portosystemic shunt) [id number] EXAM: DUPLEX ULTRASOUND OF LIVER AND TIPS SHUNT TECHNIQUE: Color and duplex Doppler ultrasound was performed to evaluate the hepatic in-flow and out-flow vessels. COMPARISON:  CT CAP, 04/12/2023.  IR TIPS, 04/17/2023. FINDINGS: Suboptimal evaluation, with poor acoustic penetration secondary to patient habitus. Portal Vein Velocities Main:  52 cm/sec Right:  Not visualized Left:  Not visualized TIPS Stent Velocities Proximal:  123 cm/sec Mid:  151  Distal:  95 cm/sec IVC: Present and patent with normal respiratory phasicity. Hepatic Vein Velocities Right:  11 cm/sec Mid:  17 cm/sec Left:  24 cm/sec Splenic Vein: Patent, with velocities measuring up to 49 cm/s Superior Mesenteric Vein: Pain, with velocities measuring up to 23 cm/s Hepatic Artery: Patent, with arterial velocity of 61 cm/s Ascities: Trace perihepatic ascites is present. Varices: Absent Other findings: A RIGHT pleural effusion is present. IMPRESSION: 1. Patent TIPS, with mid stent velocities > 150 cm/s 2. Cirrhotic morphology of liver. No sonographic evidence of hepatoma. Continue surveillance with ultrasound every 6 months per AASLD recommendation. 3. Incidental RIGHT pleural effusion is present. Roanna Banning, MD [facility] Radiology Electronically Signed   By: Roanna Banning M.D.   On: 08/22/2023 10:00   DG Abd 1 View Result Date: 08/22/2023 CLINICAL DATA:  Encounter for NG tube placement EXAM: ABDOMEN - 1 VIEW COMPARISON:  CT abdomen pelvis 04/20/2023 FINDINGS: Enteric tube tip in the stomach. The side port is in the distal esophagus. Recommend advancement 3-4 cm.TIPS. Question small right basilar pneumothorax versus projection artifact. Recommend dedicated chest radiographs. IMPRESSION: 1. Enteric tube tip in the stomach. The side port is in the distal esophagus. Recommend advancement 3-4 cm. 2. Question  small right basilar pneumothorax versus projection artifact. Recommend dedicated chest radiographs. Electronically Signed   By: Minerva Fester M.D.   On: 08/22/2023 02:18   CT HEAD WO CONTRAST ( ) Result Date: 08/21/2023 CLINICAL DATA:  Delirium EXAM: CT HEAD WITHOUT CONTRAST TECHNIQUE: Contiguous axial images were obtained from the base of the skull through the vertex without intravenous contrast. RADIATION DOSE REDUCTION: This exam was performed according to the departmental dose-optimization program which includes automated exposure control, adjustment of the mA and/or kV according to patient size and/or use of iterative reconstruction technique. COMPARISON:  12/12/2019 FINDINGS: Brain: Normal anatomic configuration. No abnormal intra or extra-axial mass lesion or fluid collection. No abnormal mass effect or midline shift. No evidence of acute intracranial hemorrhage or infarct. Ventricular size is normal. Cerebellum unremarkable. Vascular: Unremarkable Skull: Intact Sinuses/Orbits: Paranasal sinuses are clear. Orbits are unremarkable. Other: Mastoid air cells and middle ear cavities are clear. IMPRESSION: 1. No acute intracranial abnormality. Electronically Signed   By: Helyn Numbers M.D.   On: 08/21/2023 21:33   DG Chest 2 View Result Date: 08/21/2023 CLINICAL DATA:  Altered mental status EXAM: CHEST - 2 VIEW COMPARISON:  04/17/2023 FINDINGS: Small right pleural effusion. Right basilar airspace opacities. The left lung is clear. No pneumothorax. Stable cardiomediastinal silhouette. Left chest wall ICD. IMPRESSION: Small right pleural effusion and compressive atelectasis. Electronically Signed   By: Minerva Fester M.D.   On: 08/21/2023 20:57     Subjective:  No significant events overnight, he is eager to go home. Discharge Exam: Vitals:   08/26/23 0400 08/26/23 0800  BP: 124/75 109/64  Pulse:  86  Resp:  13  Temp:  98 F (36.7 C)  SpO2:  97%   Vitals:   08/25/23 2319 08/26/23 0000  08/26/23 0400 08/26/23 0800  BP: 104/66 112/66 124/75 109/64  Pulse: 86   86  Resp: 13 12  13   Temp: 98.2 F (36.8 C)   98 F (36.7 C)  TempSrc: Oral   Oral  SpO2:    97%  Weight:        General: Pt is alert, awake, not in acute distress Cardiovascular: RRR, S1/S2 +, no rubs, no gallops Respiratory: CTA bilaterally, no wheezing, no rhonchi Abdominal:  Soft, NT, ND, bowel sounds + Extremities: +1 edema, no cyanosis    The results of significant diagnostics from this hospitalization (including imaging, microbiology, ancillary and laboratory) are listed below for reference.     Microbiology: Recent Results (from the past 240 hours)  Blood culture (routine x 2)     Status: None   Collection Time: 08/21/23  8:36 PM   Specimen: BLOOD  Result Value Ref Range Status   Specimen Description BLOOD SITE NOT SPECIFIED  Final   Special Requests   Final    BOTTLES DRAWN AEROBIC AND ANAEROBIC Blood Culture adequate volume   Culture   Final    NO GROWTH 5 DAYS Performed at [facility], [address] [place], [place] [address]    Report Status 08/26/2023 FINAL  Final  Blood culture (routine x 2)     Status: None   Collection Time: 08/21/23  9:22 PM   Specimen: BLOOD RIGHT HAND  Result Value Ref Range Status   Specimen Description BLOOD RIGHT HAND  Final   Special Requests   Final    BOTTLES DRAWN AEROBIC AND ANAEROBIC Blood Culture adequate volume   Culture   Final    NO GROWTH 5 DAYS Performed at [facility], [address] [place], [place] [address]    Report Status 08/26/2023 FINAL  Final     Labs: BNP (last 3 results) Recent Labs    11/02/22 1157 01/24/23 1917 04/12/23 1220  BNP 27.3 40.0 59.5   Basic Metabolic Panel: Recent Labs  Lab 08/22/23 0422 08/22/23 0848 08/23/23 1053 08/23/23 2113 08/24/23 0518 08/25/23 0911 08/26/23 0553  NA 141   < > 145 138 139 136 133*  K 3.6   < > 4.0 3.9 3.1* 3.8 3.4*  CL 111   < > 113* 110 107 105 104  CO2 16*    < > 16* 19* 16* 22 23  GLUCOSE 124*   < > 131* 240* 138* 276* 131*  BUN 14   < > 8 7 8 8 10   CREATININE 0.93   < > 0.94 1.02 0.77 0.78 0.69  CALCIUM 8.4*   < > 8.9 8.5* 8.1* 7.8* 7.7*  MG 1.8  --   --   --   --   --   --   PHOS 2.7  --   --   --  3.6 2.9 3.0   < > = values in this interval not displayed.   Liver Function Tests: Recent Labs  Lab 08/22/23 0422 08/23/23 1053 08/24/23 0518 08/25/23 0911 08/26/23 0553  AST 40 161* 101* 95* 69*  ALT 25 53* 42 40 33  ALKPHOS 129* 163* 116 116 103  BILITOT 2.0* 2.2* 1.5* 1.4* 1.0  PROT 5.6* 6.9 5.4* 5.2* 4.4*  ALBUMIN 2.3* 2.7* 2.1* 2.0* 1.7*   Recent Labs  Lab 08/21/23 2031  LIPASE 30   Recent Labs  Lab 08/22/23 0422 08/23/23 1424 08/24/23 0518 08/25/23 0911 08/26/23 0553  AMMONIA 52* 41* 36* 33 33   CBC: Recent Labs  Lab 08/21/23 2031 08/21/23 2042 08/22/23 0422 08/23/23 0230 08/24/23 0518 08/25/23 0539 08/26/23 0553  WBC 5.8  --  9.1 9.3 6.1 6.3 5.5  NEUTROABS 3.1  --   --  4.7  --   --   --   HGB 12.9*   < > 12.1* 13.9 11.7* 12.9* 10.4*  HCT 38.4*   < > 36.1* 42.0 34.5* 38.5* 30.1*  MCV 80.7  --  81.1 82.7 81.8 82.8 80.3  PLT 127*  --  136* 144* 109* 94* 79*   < > = values in this interval not displayed.   Cardiac Enzymes: No results for input(s): "CKTOTAL", "CKMB", "CKMBINDEX", "TROPONINI" in the last 168 hours. BNP: Invalid input(s): "POCBNP" CBG: Recent Labs  Lab 08/25/23 1628 08/25/23 1957 08/25/23 2318 08/26/23 0444 08/26/23 0827  GLUCAP 316* 288* 234* 93 248*   D-Dimer No results for input(s): "DDIMER" in the last 72 hours. Hgb A1c No results for input(s): "HGBA1C" in the last 72 hours. Lipid Profile No results for input(s): "CHOL", "HDL", "LDLCALC", "TRIG", "CHOLHDL", "LDLDIRECT" in the last 72 hours. Thyroid function studies No results for input(s): "TSH", "T4TOTAL", "T3FREE", "THYROIDAB" in the last 72 hours.  Invalid input(s): "FREET3" Anemia work up No results for input(s):  "VITAMINB12", "FOLATE", "FERRITIN", "TIBC", "IRON", "RETICCTPCT" in the last 72 hours. Urinalysis    Component Value Date/Time   COLORURINE YELLOW 08/21/2023 2250   APPEARANCEUR CLEAR 08/21/2023 2250   LABSPEC 1.037 (H) 08/21/2023 2250   PHURINE 6.0 08/21/2023 2250   GLUCOSEU >=500 (A) 08/21/2023 2250   HGBUR SMALL (A) 08/21/2023 2250   BILIRUBINUR NEGATIVE 08/21/2023 2250   KETONESUR 80 (A) 08/21/2023 2250   PROTEINUR NEGATIVE 08/21/2023 2250   NITRITE NEGATIVE 08/21/2023 2250   LEUKOCYTESUR NEGATIVE 08/21/2023 2250   Sepsis Labs Recent Labs  Lab 08/23/23 0230 08/24/23 0518 08/25/23 0539 08/26/23 0553  WBC 9.3 6.1 6.3 5.5   Microbiology Recent Results (from the past 240 hours)  Blood culture (routine x 2)     Status: None   Collection Time: 08/21/23  8:36 PM   Specimen: BLOOD  Result Value Ref Range Status   Specimen Description BLOOD SITE NOT SPECIFIED  Final   Special Requests   Final    BOTTLES DRAWN AEROBIC AND ANAEROBIC Blood Culture adequate volume   Culture   Final    NO GROWTH 5 DAYS Performed at [facility], [address] [place], [place] [address]    Report Status 08/26/2023 FINAL  Final  Blood culture (routine x 2)     Status: None   Collection Time: 08/21/23  9:22 PM   Specimen: BLOOD RIGHT HAND  Result Value Ref Range Status   Specimen Description BLOOD RIGHT HAND  Final   Special Requests   Final    BOTTLES DRAWN AEROBIC AND ANAEROBIC Blood Culture adequate volume   Culture   Final    NO GROWTH 5 DAYS Performed at [facility], [address], [place], [place] [address]    Report Status 08/26/2023 FINAL  Final     Time coordinating discharge: Over 30 minutes  SIGNED:   Huey Bienenstock, MD  [facility] 08/26/2023, 10:43 AM Pager   If 7PM-7AM, please contact night-coverage [email or web address] Password [email or web address]

## 2023-08-26 NOTE — Discharge Instructions (Signed)
 Follow with Primary MD Ricky Stabs, NP-C in 7 days   Get CBC, CMP, checked  by Primary MD next visit.    Activity: As tolerated with Full fall precautions use walker/cane & assistance as needed   Disposition Home    Diet: Heart Healthy /low-salt diet/carb modified   On your next visit with your primary care physician please Get Medicines reviewed and adjusted.   Please request your Prim.MD to go over all Hospital Tests and Procedure/Radiological results at the follow up, please get all Hospital records sent to your Prim MD by signing hospital release before you go home.   If you experience worsening of your admission symptoms, develop shortness of breath, life threatening emergency, suicidal or homicidal thoughts you must seek medical attention immediately by calling 911 or calling your MD immediately  if symptoms less severe.  You Must read complete instructions/literature along with all the possible adverse reactions/side effects for all the Medicines you take and that have been prescribed to you. Take any new Medicines after you have completely understood and accpet all the possible adverse reactions/side effects.   Do not drive, operating heavy machinery, perform activities at heights, swimming or participation in water activities or provide baby sitting services if your were admitted for syncope or siezures until you have seen by Primary MD or a Neurologist and advised to do so again.  Do not drive when taking Pain medications.    Do not take more than prescribed Pain, Sleep and Anxiety Medications  Special Instructions: If you have smoked or chewed Tobacco  in the last 2 yrs please stop smoking, stop any regular Alcohol  and or any Recreational drug use.  Wear Seat belts while driving.   Please note  You were cared for by a hospitalist during your hospital stay. If you have any questions about your discharge medications or the care you received while you were in  the hospital after you are discharged, you can call the unit and asked to speak with the hospitalist on call if the hospitalist that took care of you is not available. Once you are discharged, your primary care physician will handle any further medical issues. Please note that NO REFILLS for any discharge medications will be authorized once you are discharged, as it is imperative that you return to your primary care physician (or establish a relationship with a primary care physician if you do not have one) for your aftercare needs so that they can reassess your need for medications and monitor your lab values.

## 2023-08-26 NOTE — TOC Transition Note (Signed)
 Transition of Care [facility]) - Discharge Note   Patient Details  Name: Connor Vaughn MRN: [id number] Date of Birth: 06-Nov-1960  Transition of Care [facility]) CM/SW Contact:  Lawerance Sabal, RN Phone Number: 08/26/2023, 10:57 AM   Clinical Narrative:      DC note faxed to [facility].    Final next level of care: Home/Self Care (Home with family/caregiver support and [facility]) Barriers to Discharge: No Barriers Identified   Patient Goals and CMS Choice Patient states their goals for this hospitalization and ongoing recovery are:: Return home          Discharge Placement                       Discharge Plan and Services Additional resources added to the After Visit Summary for   In-house Referral: Clinical Social Work                                   Social Drivers of Health (SDOH) Interventions SDOH Screenings   Food Insecurity: Food Insecurity Present (08/22/2023)  Housing: Low Risk  (08/22/2023)  Transportation Needs: No Transportation Needs (08/22/2023)  Utilities: At Risk (08/22/2023)  Tobacco Use: Medium Risk (08/21/2023)     Readmission Risk Interventions    04/19/2023   10:14 AM  Readmission Risk Prevention Plan  Transportation Screening Complete  PCP or Specialist Appt within 5-7 Days Complete  Home Care Screening Complete  Medication Review (RN CM) Referral to Pharmacy

## 2023-08-26 NOTE — Plan of Care (Signed)
  Problem: Education: Goal: Ability to describe self-care measures that may prevent or decrease complications (Diabetes Survival Skills Education) will improve Outcome: Completed/Met Goal: Individualized Educational Video(s) Outcome: Completed/Met   Problem: Coping: Goal: Ability to adjust to condition or change in health will improve Outcome: Completed/Met   Problem: Fluid Volume: Goal: Ability to maintain a balanced intake and output will improve Outcome: Completed/Met   Problem: Health Behavior/Discharge Planning: Goal: Ability to identify and utilize available resources and services will improve Outcome: Completed/Met Goal: Ability to manage health-related needs will improve Outcome: Completed/Met   Problem: Metabolic: Goal: Ability to maintain appropriate glucose levels will improve Outcome: Completed/Met   Problem: Nutritional: Goal: Maintenance of adequate nutrition will improve Outcome: Completed/Met Goal: Progress toward achieving an optimal weight will improve Outcome: Completed/Met   Problem: Skin Integrity: Goal: Risk for impaired skin integrity will decrease Outcome: Completed/Met   Problem: Tissue Perfusion: Goal: Adequacy of tissue perfusion will improve Outcome: Completed/Met   Problem: Education: Goal: Ability to describe self-care measures that may prevent or decrease complications (Diabetes Survival Skills Education) will improve Outcome: Completed/Met Goal: Individualized Educational Video(s) Outcome: Completed/Met   Problem: Cardiac: Goal: Ability to maintain an adequate cardiac output will improve Outcome: Completed/Met   Problem: Health Behavior/Discharge Planning: Goal: Ability to identify and utilize available resources and services will improve Outcome: Completed/Met Goal: Ability to manage health-related needs will improve Outcome: Completed/Met   Problem: Fluid Volume: Goal: Ability to achieve a balanced intake and output will  improve Outcome: Completed/Met   Problem: Metabolic: Goal: Ability to maintain appropriate glucose levels will improve Outcome: Completed/Met   Problem: Nutritional: Goal: Maintenance of adequate nutrition will improve Outcome: Completed/Met Goal: Maintenance of adequate weight for body size and type will improve Outcome: Completed/Met   Problem: Respiratory: Goal: Will regain and/or maintain adequate ventilation Outcome: Completed/Met   Problem: Urinary Elimination: Goal: Ability to achieve and maintain adequate renal perfusion and functioning will improve Outcome: Completed/Met   Problem: Education: Goal: Knowledge of General Education information will improve Description: Including pain rating scale, medication(s)/side effects and non-pharmacologic comfort measures Outcome: Completed/Met   Problem: Health Behavior/Discharge Planning: Goal: Ability to manage health-related needs will improve Outcome: Completed/Met   Problem: Clinical Measurements: Goal: Ability to maintain clinical measurements within normal limits will improve Outcome: Completed/Met Goal: Will remain free from infection Outcome: Completed/Met Goal: Diagnostic test results will improve Outcome: Completed/Met Goal: Respiratory complications will improve Outcome: Completed/Met Goal: Cardiovascular complication will be avoided Outcome: Completed/Met   Problem: Activity: Goal: Risk for activity intolerance will decrease Outcome: Completed/Met   Problem: Nutrition: Goal: Adequate nutrition will be maintained Outcome: Completed/Met   Problem: Coping: Goal: Level of anxiety will decrease Outcome: Completed/Met   Problem: Elimination: Goal: Will not experience complications related to bowel motility Outcome: Completed/Met Goal: Will not experience complications related to urinary retention Outcome: Completed/Met   Problem: Pain Managment: Goal: General experience of comfort will improve and/or  be controlled Outcome: Completed/Met   Problem: Safety: Goal: Ability to remain free from injury will improve Outcome: Completed/Met   Problem: Skin Integrity: Goal: Risk for impaired skin integrity will decrease Outcome: Completed/Met

## 2023-08-28 ENCOUNTER — Other Ambulatory Visit (HOSPITAL_COMMUNITY): Payer: Self-pay

## 2023-08-30 ENCOUNTER — Other Ambulatory Visit (HOSPITAL_COMMUNITY): Payer: Self-pay

## 2023-09-21 ENCOUNTER — Telehealth: Payer: Self-pay

## 2023-09-21 NOTE — Telephone Encounter (Signed)
 [employer] remote monitoring alert:   No messages received for at least 21 days Last message received 21 days ago. The patient will be deactivated in 68 days.

## 2023-09-21 DEATH — deceased

## 2023-09-25 NOTE — Telephone Encounter (Signed)
 Patient deceased

## 2023-10-06 NOTE — Addendum Note (Signed)
 Addended by: Edra Govern D on: 10/06/2023 02:38 PM   Modules accepted: Orders

## 2023-10-06 NOTE — Progress Notes (Signed)
 Remote ICD transmission.

## 2023-11-06 ENCOUNTER — Ambulatory Visit (HOSPITAL_COMMUNITY): Payer: Medicaid Other

## 2023-11-13 ENCOUNTER — Ambulatory Visit: Payer: Medicaid Other | Admitting: Cardiology
